# Patient Record
Sex: Male | Born: 1965
Health system: Southern US, Community
[De-identification: ages and names within clinical notes are randomized; demographics above are authoritative.]

## PROBLEM LIST (undated history)

## (undated) DIAGNOSIS — F32A Depression, unspecified: Secondary | ICD-10-CM

## (undated) DIAGNOSIS — Z8489 Family history of other specified conditions: Secondary | ICD-10-CM

## (undated) DIAGNOSIS — E11319 Type 2 diabetes mellitus with unspecified diabetic retinopathy without macular edema: Secondary | ICD-10-CM

## (undated) DIAGNOSIS — F419 Anxiety disorder, unspecified: Secondary | ICD-10-CM

## (undated) DIAGNOSIS — I499 Cardiac arrhythmia, unspecified: Secondary | ICD-10-CM

## (undated) DIAGNOSIS — J189 Pneumonia, unspecified organism: Secondary | ICD-10-CM

## (undated) DIAGNOSIS — L97509 Non-pressure chronic ulcer of other part of unspecified foot with unspecified severity: Secondary | ICD-10-CM

## (undated) DIAGNOSIS — F329 Major depressive disorder, single episode, unspecified: Secondary | ICD-10-CM

## (undated) DIAGNOSIS — I471 Supraventricular tachycardia, unspecified: Secondary | ICD-10-CM

## (undated) DIAGNOSIS — E119 Type 2 diabetes mellitus without complications: Secondary | ICD-10-CM

## (undated) DIAGNOSIS — E1140 Type 2 diabetes mellitus with diabetic neuropathy, unspecified: Secondary | ICD-10-CM

## (undated) HISTORY — PX: DENTAL RESTORATION/EXTRACTION WITH X-RAY: SHX5796

## (undated) HISTORY — PX: WISDOM TOOTH EXTRACTION: SHX21

## (undated) HISTORY — PX: MOUTH SURGERY: SHX715

## (undated) HISTORY — PX: PILONIDAL CYST DRAINAGE: SHX743

## (undated) HISTORY — PX: SUPRAVENTRICULAR TACHYCARDIA ABLATION: SHX6106

## (undated) HISTORY — PX: EYE SURGERY: SHX253

---

## 1999-09-26 ENCOUNTER — Encounter: Payer: Self-pay | Admitting: Emergency Medicine

## 1999-09-26 ENCOUNTER — Emergency Department (HOSPITAL_COMMUNITY): Admission: EM | Admit: 1999-09-26 | Discharge: 1999-09-26 | Payer: Self-pay | Admitting: Emergency Medicine

## 2001-07-05 ENCOUNTER — Emergency Department (HOSPITAL_COMMUNITY): Admission: EM | Admit: 2001-07-05 | Discharge: 2001-07-06 | Payer: Self-pay | Admitting: Emergency Medicine

## 2002-03-27 ENCOUNTER — Emergency Department (HOSPITAL_COMMUNITY): Admission: EM | Admit: 2002-03-27 | Discharge: 2002-03-28 | Payer: Self-pay | Admitting: Emergency Medicine

## 2002-03-27 ENCOUNTER — Encounter: Payer: Self-pay | Admitting: Emergency Medicine

## 2002-03-28 ENCOUNTER — Encounter: Payer: Self-pay | Admitting: Emergency Medicine

## 2004-10-30 ENCOUNTER — Emergency Department (HOSPITAL_COMMUNITY): Admission: EM | Admit: 2004-10-30 | Discharge: 2004-10-30 | Payer: Self-pay | Admitting: Family Medicine

## 2004-11-03 ENCOUNTER — Emergency Department (HOSPITAL_COMMUNITY): Admission: EM | Admit: 2004-11-03 | Discharge: 2004-11-03 | Payer: Self-pay | Admitting: Family Medicine

## 2005-11-02 ENCOUNTER — Emergency Department (HOSPITAL_COMMUNITY): Admission: EM | Admit: 2005-11-02 | Discharge: 2005-11-03 | Payer: Self-pay | Admitting: Emergency Medicine

## 2007-08-19 ENCOUNTER — Emergency Department (HOSPITAL_COMMUNITY): Admission: EM | Admit: 2007-08-19 | Discharge: 2007-08-19 | Payer: Self-pay | Admitting: Family Medicine

## 2010-03-21 DIAGNOSIS — E113599 Type 2 diabetes mellitus with proliferative diabetic retinopathy without macular edema, unspecified eye: Secondary | ICD-10-CM | POA: Insufficient documentation

## 2010-03-21 DIAGNOSIS — H4310 Vitreous hemorrhage, unspecified eye: Secondary | ICD-10-CM | POA: Insufficient documentation

## 2010-04-24 DIAGNOSIS — G4700 Insomnia, unspecified: Secondary | ICD-10-CM | POA: Insufficient documentation

## 2010-04-24 DIAGNOSIS — E785 Hyperlipidemia, unspecified: Secondary | ICD-10-CM | POA: Insufficient documentation

## 2010-10-21 ENCOUNTER — Ambulatory Visit
Admission: RE | Admit: 2010-10-21 | Discharge: 2010-10-21 | Disposition: A | Discharge status: Home / Self Care | Payer: Medicaid Other | Source: Ambulatory Visit | Attending: Surgery | Admitting: Surgery

## 2010-10-21 ENCOUNTER — Other Ambulatory Visit: Payer: Self-pay | Admitting: Surgery

## 2010-10-21 DIAGNOSIS — Z01818 Encounter for other preprocedural examination: Secondary | ICD-10-CM

## 2011-01-30 NOTE — Consult Note (Signed)
NAME:  Billy [name], DESIRE NO.:  [id number]   MEDICAL RECORD NO.:  [id number]          PATIENT TYPE:  EMS   LOCATION:  [facility]                         FACILITY:  [facility]   PHYSICIAN:  Thora Lance, M.D.  DATE OF BIRTH:  [birth year]   DATE OF CONSULTATION:  11/03/2005  DATE OF DISCHARGE:                                   CONSULTATION   CHIEF COMPLAINT:  Chest discomfort.   This is a 45 year old white male with history of diabetes mellitus and  hyperlipidemia who has been noncompliant with medical therapy and follow-up  with his primary physician, Dr. Pete Glatter, who on Saturday had smoked some  marijuana and had eaten two large cheeseburgers.  He was sitting in the car  with his friend when he felt a sudden flash of pain in a linear pattern  across his left chest.  This lasted for about a second.  Afterwards he felt  shaky, clammy and a little hazy.  He had some difficulty with  concentration.  This all lasted a few minutes and then resolved.  For the  next two hours, he felt a vague pressure in his left chest which resolved  spontaneously. It was not exertional.  There was no shortness of breath,  nausea or vomiting.  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 This lasted for about a third of a  second.  He came immediately to the ER.  In the ER he again had similar  sensation lasting two or three seconds.  He was placed on nitroglycerin for  the concern about unstable angina and I was asked to see the patient.   The patient does have a history of diabetes mellitus for about five years.  He is taking no medication for this at this time and his blood sugars are  running consistently in the 200s.  He also has history of hyperlipidemia and  has stopped taking his cholesterol medicine.  His other medical history is  significant for SVT status post  ablation 10 years ago.  He last saw his  primary physician, Dr. Merlene Laughter, about six weeks ago.  He was suppose  to get lab work and follow up and has not done so yet.   PAST MEDICAL HISTORY:  1.  Diabetes mellitus.  2.  SVT status post ablation.  3.  Increased cholesterol.   ALLERGIES:  NO KNOWN DRUG ALLERGIES.   CURRENT MEDICATION:  Pierce Crane (male enhancement).  No other medications.   SOCIAL HISTORY:  He is divorcing his wife.  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  Smoking:  No.  Alcohol:  Occasional.   REVIEW OF SYSTEMS:  Otherwise negative.   PHYSICAL EXAMINATION:  GENERAL APPEARANCE:  A moderately obese white male.  VITAL SIGNS:  Blood pressure 139/86, heart rate 87, respirations 20,  temperature 97.8.  NECK:  No bruits.  LUNGS:  Clear.  CARDIOVASCULAR:  Regular rate and rhythm without  murmurs, rubs, or gallops.  ABDOMEN:  Soft, nontender, no masses or hepatosplenomegaly.  EXTREMITIES:  No edema.   LABORATORY DATA:  Chemistry:  Sodium 138, potassium 3.6, chloride 104, BUN  13, creatinine 0.7, glucose 214.  CK-MB 1.4/1.5/1.3, troponin I less than  0.05 x3.   Chest x-ray:  No acute abnormalities.   EKG:  Normal sinus rhythm, normal EKG.   ASSESSMENT:  1.  Noncardiac chest discomfort.  2.  Diabetes mellitus, poorly controlled.  3.  Hyperlipidemia, off medications.  4.  History of supraventricular tachycardia.  5.  Medical noncompliance.   PLAN:  Discharge from emergency room.  Will have Dr. Laverle Hobby nurse call  him tomorrow to arrange follow-up for management of his multiple medical  problems.           ______________________________  Thora Lance, M.D.     Delorse Limber  D:  11/03/2005  T:  11/03/2005  Job:  [id number]   cc:   Hal T. Stoneking, M.D.  Fax: [phone]

## 2011-02-26 DIAGNOSIS — I10 Essential (primary) hypertension: Secondary | ICD-10-CM | POA: Diagnosis present

## 2011-04-18 ENCOUNTER — Emergency Department (HOSPITAL_COMMUNITY)
Admission: EM | Admit: 2011-04-18 | Discharge: 2011-04-18 | Disposition: A | Discharge status: Home / Self Care | Payer: Medicaid Other | Attending: Emergency Medicine | Admitting: Emergency Medicine

## 2011-04-18 ENCOUNTER — Emergency Department (HOSPITAL_COMMUNITY): Payer: Medicaid Other

## 2011-04-18 DIAGNOSIS — I10 Essential (primary) hypertension: Secondary | ICD-10-CM | POA: Insufficient documentation

## 2011-04-18 DIAGNOSIS — Z79899 Other long term (current) drug therapy: Secondary | ICD-10-CM | POA: Insufficient documentation

## 2011-04-18 DIAGNOSIS — R062 Wheezing: Secondary | ICD-10-CM | POA: Insufficient documentation

## 2011-04-18 DIAGNOSIS — E7800 Pure hypercholesterolemia, unspecified: Secondary | ICD-10-CM | POA: Insufficient documentation

## 2011-04-18 DIAGNOSIS — R509 Fever, unspecified: Secondary | ICD-10-CM | POA: Insufficient documentation

## 2011-04-18 DIAGNOSIS — E119 Type 2 diabetes mellitus without complications: Secondary | ICD-10-CM | POA: Insufficient documentation

## 2011-04-18 DIAGNOSIS — R0989 Other specified symptoms and signs involving the circulatory and respiratory systems: Secondary | ICD-10-CM | POA: Insufficient documentation

## 2011-04-18 DIAGNOSIS — R05 Cough: Secondary | ICD-10-CM | POA: Insufficient documentation

## 2011-04-18 DIAGNOSIS — J189 Pneumonia, unspecified organism: Secondary | ICD-10-CM | POA: Insufficient documentation

## 2011-04-18 DIAGNOSIS — R059 Cough, unspecified: Secondary | ICD-10-CM | POA: Insufficient documentation

## 2011-04-18 LAB — TROPONIN I: Troponin I: <0.3 ng/mL (ref <0.30)

## 2011-04-18 LAB — DIFFERENTIAL
Basophils Absolute: 0 10*3/uL (ref 0.0–0.1)
Basophils Relative: 0 % (ref 0–1)
Eosinophils Absolute: 0.1 10*3/uL (ref 0.0–0.7)
Eosinophils Relative: 1 % (ref 0–5)
Lymphocytes Relative: 14 % (ref 12–46)
Lymphs Abs: 1.2 10*3/uL (ref 0.7–4.0)
Monocytes Absolute: 1.1 10*3/uL — ABNORMAL HIGH (ref 0.1–1.0)
Monocytes Relative: 13 % — ABNORMAL HIGH (ref 3–12)
Neutro Abs: 6.2 10*3/uL (ref 1.7–7.7)
Neutrophils Relative %: 72 % (ref 43–77)

## 2011-04-18 LAB — CBC
HCT: 39.3 % (ref 39.0–52.0)
Hemoglobin: 13.8 g/dL (ref 13.0–17.0)
MCH: 31.7 pg (ref 26.0–34.0)
MCHC: 35.1 g/dL (ref 30.0–36.0)
MCV: 90.3 fL (ref 78.0–100.0)
Platelets: 168 10*3/uL (ref 150–400)
RBC: 4.35 MIL/uL (ref 4.22–5.81)
RDW: 12.2 % (ref 11.5–15.5)
WBC: 8.6 10*3/uL (ref 4.0–10.5)

## 2011-04-18 LAB — CK TOTAL AND CKMB (NOT AT ARMC)
CK, MB: 2.5 ng/mL (ref 0.3–4.0)
Relative Index: 1.7 (ref 0.0–2.5)
Total CK: 150 U/L (ref 7–232)

## 2011-04-18 LAB — POCT I-STAT, CHEM 8
BUN: 16 mg/dL (ref 6–23)
Calcium, Ion: 1.18 mmol/L (ref 1.12–1.32)
Chloride: 104 mEq/L (ref 96–112)
Creatinine, Ser: 1.1 mg/dL (ref 0.50–1.35)
Glucose, Bld: 138 mg/dL — ABNORMAL HIGH (ref 70–99)
HCT: 40 % (ref 39.0–52.0)
Hemoglobin: 13.6 g/dL (ref 13.0–17.0)
Potassium: 4.3 mEq/L (ref 3.5–5.1)
Sodium: 139 mEq/L (ref 135–145)
TCO2: 25 mmol/L (ref 0–100)

## 2011-07-01 DIAGNOSIS — M1000 Idiopathic gout, unspecified site: Secondary | ICD-10-CM | POA: Insufficient documentation

## 2011-09-23 DIAGNOSIS — F419 Anxiety disorder, unspecified: Secondary | ICD-10-CM | POA: Diagnosis present

## 2012-05-15 ENCOUNTER — Emergency Department (HOSPITAL_COMMUNITY): Payer: Medicaid Other

## 2012-05-15 ENCOUNTER — Encounter (HOSPITAL_COMMUNITY): Payer: Self-pay | Admitting: *Deleted

## 2012-05-15 ENCOUNTER — Emergency Department (HOSPITAL_COMMUNITY)
Admission: EM | Admit: 2012-05-15 | Discharge: 2012-05-16 | Disposition: A | Discharge status: Home / Self Care | Payer: Medicaid Other | Attending: Emergency Medicine | Admitting: Emergency Medicine

## 2012-05-15 DIAGNOSIS — N201 Calculus of ureter: Secondary | ICD-10-CM | POA: Insufficient documentation

## 2012-05-15 DIAGNOSIS — R109 Unspecified abdominal pain: Secondary | ICD-10-CM | POA: Insufficient documentation

## 2012-05-15 DIAGNOSIS — E119 Type 2 diabetes mellitus without complications: Secondary | ICD-10-CM | POA: Insufficient documentation

## 2012-05-15 DIAGNOSIS — R110 Nausea: Secondary | ICD-10-CM | POA: Insufficient documentation

## 2012-05-15 DIAGNOSIS — Z794 Long term (current) use of insulin: Secondary | ICD-10-CM | POA: Insufficient documentation

## 2012-05-15 DIAGNOSIS — Z79899 Other long term (current) drug therapy: Secondary | ICD-10-CM | POA: Insufficient documentation

## 2012-05-15 MED ORDER — TAMSULOSIN HCL 0.4 MG PO CAPS: ORAL_CAPSULE | ORAL | Status: DC

## 2012-05-15 MED ORDER — KETOROLAC TROMETHAMINE 10 MG PO TABS: ORAL_TABLET | ORAL | Status: DC

## 2012-05-15 MED ORDER — HYDROMORPHONE HCL PF 1 MG/ML IJ SOLN
1.0000 mg | Freq: Once | INTRAMUSCULAR | Status: AC
  Administered 2012-05-15: 1 mg via INTRAVENOUS
  Filled 2012-05-15: qty 1

## 2012-05-15 MED ORDER — KETOROLAC TROMETHAMINE 30 MG/ML IJ SOLN
30.0000 mg | Freq: Once | INTRAMUSCULAR | Status: AC
  Administered 2012-05-16: 30 mg via INTRAVENOUS
  Filled 2012-05-15: qty 1

## 2012-05-15 MED ORDER — DIPHENHYDRAMINE HCL 50 MG/ML IJ SOLN
50.0000 mg | Freq: Once | INTRAMUSCULAR | Status: AC
  Administered 2012-05-15: 50 mg via INTRAVENOUS
  Filled 2012-05-15: qty 1

## 2012-05-15 MED ORDER — OXYCODONE-ACETAMINOPHEN 5-325 MG PO TABS: ORAL_TABLET | ORAL | Status: DC

## 2012-05-15 MED ORDER — METOCLOPRAMIDE HCL 5 MG/ML IJ SOLN
10.0000 mg | Freq: Once | INTRAMUSCULAR | Status: AC
  Administered 2012-05-15: 10 mg via INTRAVENOUS
  Filled 2012-05-15: qty 2

## 2012-05-15 MED ORDER — SODIUM CHLORIDE 0.9 % IV SOLN
INTRAVENOUS | Status: DC
  Administered 2012-05-15: 23:00:00 via INTRAVENOUS

## 2012-05-15 MED ORDER — PROMETHAZINE HCL 25 MG RE SUPP: 25.0000 mg | Freq: Four times a day (QID) | RECTAL | Status: DC | PRN

## 2012-05-15 NOTE — ED Notes (Signed)
EMS called to home.  Found patient ambulatory in the yard. He is complaining of right flank pain that started 2 days ago. He rates his pain at 10 of 10 He states that he has no history of kidney stones. Visual impairment  due to diabetes

## 2012-05-15 NOTE — ED Provider Notes (Addendum)
History     CSN: [id number]  Arrival date & time 05/15/12  2148   First MD Initiated Contact with Patient 05/15/12 2215      Chief Complaint  Patient presents with  . Flank Pain    right   Level V caveat for urgent need for intervention for pain control  (Consider location/radiation/quality/duration/timing/severity/associated sxs/prior treatment) HPI Patient relates 2 days ago he started having pain in his right flank that was initially intermittent but has been steady the past 3 hours. He has nausea without vomiting. He has visual impairment and does not know if he is having hematuria. He denies being a heavy caffeine or milk drinker. He states he's never had this before. He thinks this is from eating at a [employer].  PCP Dr. Evlyn Kanner   Past Medical History  Diagnosis Date  . Diabetes mellitus     No past surgical history on file.  No family history on file. Strong family history of renal stones  History  Substance Use Topics  . Smoking status: sometimes  . Smokeless tobacco: Not on file  . Alcohol Use: no   Lives at home      Review of Systems  All other systems reviewed and are negative.    Allergies  Review of patient's allergies indicates no known allergies.  Home Medications   Current Outpatient Rx  Name Route Sig Dispense Refill  . ALPRAZOLAM 1 MG PO TABS Oral Take 1 mg by mouth 3 (three) times daily as needed. For anxiety    . INSULIN GLARGINE 100 UNIT/ML [place] SOLN Subcutaneous Inject 30 Units into the skin at bedtime.    Marland Kitchen LINAGLIPTIN 5 MG PO TABS Oral Take 5 mg by mouth daily.    Marland Kitchen RAMIPRIL 10 MG PO CAPS Oral Take 10 mg by mouth daily.      BP 143/74  Pulse 92  Temp 97.7 F (36.5 C) (Oral)  Resp 20  SpO2 98%  Vital signs normal    Physical Exam  Nursing note and vitals reviewed. Constitutional: He is oriented to person, place, and time. He appears well-developed and well-nourished.  Non-toxic appearance. He does not appear ill.  He appears distressed.  HENT:  Head: Normocephalic and atraumatic.  Right Ear: External ear normal.  Left Ear: External ear normal.  Nose: Nose normal. No mucosal edema or rhinorrhea.  Mouth/Throat: Oropharynx is clear and moist and mucous membranes are normal. No dental abscesses or uvula swelling.  Eyes: Conjunctivae and EOM are normal. Pupils are equal, round, and reactive to light.  Neck: Normal range of motion and full passive range of motion without pain. Neck supple.  Cardiovascular: Normal rate, regular rhythm and normal heart sounds.  Exam reveals no gallop and no friction rub.   No murmur heard. Pulmonary/Chest: Effort normal and breath sounds normal. No respiratory distress. He has no wheezes. He has no rhonchi. He has no rales. He exhibits no tenderness and no crepitus.  Abdominal: Soft. Normal appearance and bowel sounds are normal. He exhibits no distension. There is no tenderness. There is no rebound and no guarding.  Musculoskeletal: Normal range of motion. He exhibits no edema and no tenderness.       Moves all extremities well.   Neurological: He is alert and oriented to person, place, and time. He has normal strength. No cranial nerve deficit.  Skin: Skin is warm and intact. No rash noted. He is diaphoretic. No erythema. There is pallor.  Psychiatric: His speech is normal  and behavior is normal. His mood appears not anxious.       anxious    ED Course  Procedures (including critical care time)   Medications  0.9 %  sodium chloride infusion (  Intravenous New Bag/Given 05/15/12 2231)  ketorolac (TORADOL) 30 MG/ML injection 30 mg (not administered)  HYDROmorphone (DILAUDID) injection 1 mg (1 mg Intravenous Given 05/15/12 2230)  metoCLOPramide (REGLAN) injection 10 mg (10 mg Intravenous Given 05/15/12 2231)  diphenhydrAMINE (BENADRYL) injection 50 mg (50 mg Intravenous Given 05/15/12 2231)   Recheck pt states his pain is almost gone. Discuss treatment plan.   Pt discharged  by nursing staff before urine obtained.  Results for orders placed during the hospital encounter of 04/18/11  DIFFERENTIAL      Component Value Range   Neutrophils Relative 72  43 - 77 %   Neutro Abs 6.2  1.7 - 7.7 K/uL   Lymphocytes Relative 14  12 - 46 %   Lymphs Abs 1.2  0.7 - 4.0 K/uL   Monocytes Relative 13 (*) 3 - 12 %   Monocytes Absolute 1.1 (*) 0.1 - 1.0 K/uL   Eosinophils Relative 1  0 - 5 %   Eosinophils Absolute 0.1  0.0 - 0.7 K/uL   Basophils Relative 0  0 - 1 %   Basophils Absolute 0.0  0.0 - 0.1 K/uL  CBC      Component Value Range   WBC 8.6  4.0 - 10.5 K/uL   RBC 4.35  4.22 - 5.81 MIL/uL   Hemoglobin 13.8  13.0 - 17.0 g/dL   HCT 16.1  09.6 - 04.5 %   MCV 90.3  78.0 - 100.0 fL   MCH 31.7  26.0 - 34.0 pg   MCHC 35.1  30.0 - 36.0 g/dL   RDW 40.9  81.1 - 91.4 %   Platelets 168  150 - 400 K/uL  CK TOTAL AND CKMB      Component Value Range   Total CK 150  7 - 232 U/L   CK, MB 2.5  0.3 - 4.0 ng/mL   Relative Index 1.7  0.0 - 2.5  TROPONIN I      Component Value Range   Troponin I <0.30  <0.30 ng/mL  POCT I-STAT, CHEM 8      Component Value Range   Sodium 139  135 - 145 mEq/L   Potassium 4.3  3.5 - 5.1 mEq/L   Chloride 104  96 - 112 mEq/L   BUN 16  6 - 23 mg/dL   Creatinine, Ser 7.82  0.50 - 1.35 mg/dL   Glucose, Bld 956 (*) 70 - 99 mg/dL   Calcium, Ion 2.13  0.86 - 1.32 mmol/L   TCO2 25  0 - 100 mmol/L   Hemoglobin 13.6  13.0 - 17.0 g/dL   HCT 57.8  46.9 - 62.9 %   Laboratory interpretation all normal except    Ct Abdomen Pelvis Wo Contrast  05/15/2012  *RADIOLOGY REPORT*  Clinical Data: Right flank pain and nausea for 1 day  CT ABDOMEN AND PELVIS WITHOUT CONTRAST  Technique:  Multidetector CT imaging of the abdomen and pelvis was performed following the standard protocol without intravenous contrast. Sagittal and coronal MPR images reconstructed from axial data set.  Comparison: None  Findings: Minimal subsegmental atelectasis at lung base. Tiny  nonobstructing calculus lower pole right kidney image 47. Mild right hydronephrosis, perinephric edema, and right hydroureter secondary to a 3 mm diameter distal right ureteral calculus  just above the ureterovesicle junction. No additional urinary tract calcifications. Minimal nonspecific left perinephric stranding. Within limits of a nonenhanced exam, no focal abnormalities of the liver, spleen, pancreas, or adrenal glands.  Normal appendix. Stomach and bowel loops grossly unremarkable for technique. No mass, adenopathy, free fluid or inflammatory process. Calcified lymph node right external iliac. No acute osseous findings. Grade 1 anterolisthesis at L5-S1 secondary to bilateral spondylolysis L5.  IMPRESSION: Right hydronephrosis and hydroureter secondary to a 3 mm diameter distal right ureteral calculus just above the ureterovesicle junction.   Original Report Authenticated By: Lollie Marrow, M.D.     1. Right ureteral stone     New Prescriptions   KETOROLAC (TORADOL) 10 MG TABLET    Take 1 po QID until gone   OXYCODONE-ACETAMINOPHEN (PERCOCET/ROXICET) 5-325 MG PER TABLET    Take 1 or 2 po Q 6hrs for pain   PROMETHAZINE (PHENERGAN) 25 MG SUPPOSITORY    Place 1 suppository (25 mg total) rectally every 6 (six) hours as needed for nausea.   TAMSULOSIN HCL (FLOMAX) 0.4 MG CAPS    Take 1 po QD until you pass the stone.    Plan discharge  Devoria Albe, MD, FACEP   MDM          Ward Givens, MD 05/15/12 9604  Ward Givens, MD 05/16/12 Moses Manners

## 2012-06-04 ENCOUNTER — Emergency Department (HOSPITAL_BASED_OUTPATIENT_CLINIC_OR_DEPARTMENT_OTHER): Payer: Medicaid Other

## 2012-06-04 ENCOUNTER — Inpatient Hospital Stay (HOSPITAL_BASED_OUTPATIENT_CLINIC_OR_DEPARTMENT_OTHER)
Admission: EM | Admit: 2012-06-04 | Discharge: 2012-06-06 | DRG: 638 | Disposition: A | Discharge status: Home / Self Care | Payer: Medicaid Other | Attending: Endocrinology | Admitting: Endocrinology

## 2012-06-04 ENCOUNTER — Encounter (HOSPITAL_BASED_OUTPATIENT_CLINIC_OR_DEPARTMENT_OTHER): Payer: Self-pay | Admitting: *Deleted

## 2012-06-04 DIAGNOSIS — E11621 Type 2 diabetes mellitus with foot ulcer: Secondary | ICD-10-CM | POA: Diagnosis present

## 2012-06-04 DIAGNOSIS — Z79899 Other long term (current) drug therapy: Secondary | ICD-10-CM

## 2012-06-04 DIAGNOSIS — E1169 Type 2 diabetes mellitus with other specified complication: Principal | ICD-10-CM | POA: Diagnosis present

## 2012-06-04 DIAGNOSIS — E1139 Type 2 diabetes mellitus with other diabetic ophthalmic complication: Secondary | ICD-10-CM | POA: Diagnosis present

## 2012-06-04 DIAGNOSIS — Z794 Long term (current) use of insulin: Secondary | ICD-10-CM

## 2012-06-04 DIAGNOSIS — M869 Osteomyelitis, unspecified: Secondary | ICD-10-CM

## 2012-06-04 DIAGNOSIS — M9080 Osteopathy in diseases classified elsewhere, unspecified site: Secondary | ICD-10-CM | POA: Diagnosis present

## 2012-06-04 DIAGNOSIS — L97519 Non-pressure chronic ulcer of other part of right foot with unspecified severity: Secondary | ICD-10-CM | POA: Diagnosis present

## 2012-06-04 DIAGNOSIS — E1129 Type 2 diabetes mellitus with other diabetic kidney complication: Secondary | ICD-10-CM | POA: Diagnosis present

## 2012-06-04 DIAGNOSIS — E1142 Type 2 diabetes mellitus with diabetic polyneuropathy: Secondary | ICD-10-CM | POA: Diagnosis present

## 2012-06-04 DIAGNOSIS — N058 Unspecified nephritic syndrome with other morphologic changes: Secondary | ICD-10-CM | POA: Diagnosis present

## 2012-06-04 DIAGNOSIS — L97509 Non-pressure chronic ulcer of other part of unspecified foot with unspecified severity: Secondary | ICD-10-CM | POA: Diagnosis present

## 2012-06-04 DIAGNOSIS — I498 Other specified cardiac arrhythmias: Secondary | ICD-10-CM | POA: Diagnosis present

## 2012-06-04 DIAGNOSIS — E11319 Type 2 diabetes mellitus with unspecified diabetic retinopathy without macular edema: Secondary | ICD-10-CM | POA: Diagnosis present

## 2012-06-04 DIAGNOSIS — Q667 Congenital pes cavus, unspecified foot: Secondary | ICD-10-CM

## 2012-06-04 DIAGNOSIS — I10 Essential (primary) hypertension: Secondary | ICD-10-CM | POA: Diagnosis present

## 2012-06-04 DIAGNOSIS — E1149 Type 2 diabetes mellitus with other diabetic neurological complication: Secondary | ICD-10-CM | POA: Diagnosis present

## 2012-06-04 HISTORY — DX: Supraventricular tachycardia, unspecified: I47.10

## 2012-06-04 HISTORY — DX: Supraventricular tachycardia: I47.1

## 2012-06-04 LAB — CBC WITH DIFFERENTIAL/PLATELET
Basophils Absolute: 0 10*3/uL (ref 0.0–0.1)
Basophils Relative: 0 % (ref 0–1)
Eosinophils Absolute: 0.1 10*3/uL (ref 0.0–0.7)
Eosinophils Relative: 2 % (ref 0–5)
HCT: 39.4 % (ref 39.0–52.0)
Hemoglobin: 13.8 g/dL (ref 13.0–17.0)
Lymphocytes Relative: 39 % (ref 12–46)
Lymphs Abs: 2.7 10*3/uL (ref 0.7–4.0)
MCH: 31.7 pg (ref 26.0–34.0)
MCHC: 35 g/dL (ref 30.0–36.0)
MCV: 90.4 fL (ref 78.0–100.0)
Monocytes Absolute: 0.6 10*3/uL (ref 0.1–1.0)
Monocytes Relative: 9 % (ref 3–12)
Neutro Abs: 3.5 10*3/uL (ref 1.7–7.7)
Neutrophils Relative %: 50 % (ref 43–77)
Platelets: 207 10*3/uL (ref 150–400)
RBC: 4.36 MIL/uL (ref 4.22–5.81)
RDW: 12.2 % (ref 11.5–15.5)
WBC: 7 10*3/uL (ref 4.0–10.5)

## 2012-06-04 LAB — BASIC METABOLIC PANEL
BUN: 17 mg/dL (ref 6–23)
CO2: 28 mEq/L (ref 19–32)
Calcium: 9.6 mg/dL (ref 8.4–10.5)
Chloride: 102 mEq/L (ref 96–112)
Creatinine, Ser: 0.9 mg/dL (ref 0.50–1.35)
GFR calc Af Amer: >90 mL/min (ref >90)
GFR calc non Af Amer: >90 mL/min (ref >90)
Glucose, Bld: 100 mg/dL — ABNORMAL HIGH (ref 70–99)
Potassium: 4 mEq/L (ref 3.5–5.1)
Sodium: 140 mEq/L (ref 135–145)

## 2012-06-04 LAB — GLUCOSE, CAPILLARY: Glucose-Capillary: 151 mg/dL — ABNORMAL HIGH (ref 70–99)

## 2012-06-04 MED ORDER — VANCOMYCIN HCL IN DEXTROSE 1-5 GM/200ML-% IV SOLN
1000.0000 mg | Freq: Once | INTRAVENOUS | Status: AC
  Administered 2012-06-04: 1000 mg via INTRAVENOUS
  Filled 2012-06-04: qty 200

## 2012-06-04 MED ORDER — SODIUM CHLORIDE 0.9 % IV SOLN
Freq: Once | INTRAVENOUS | Status: AC
  Administered 2012-06-04: 22:00:00 via INTRAVENOUS

## 2012-06-04 MED ORDER — PIPERACILLIN-TAZOBACTAM 3.375 G IVPB 30 MIN
3.3750 g | Freq: Once | INTRAVENOUS | Status: AC
  Administered 2012-06-04: 3.375 g via INTRAVENOUS
  Filled 2012-06-04 (×2): qty 50

## 2012-06-04 NOTE — ED Notes (Signed)
Pt presents to ED today with wound to right middle toe that he noticed several days ago.  Pt is known diabetic and is very compliant with regime.  Pt attempted to treat at home with OTC remedies with no success.

## 2012-06-04 NOTE — ED Notes (Signed)
Pt and family updated on POC and admission

## 2012-06-04 NOTE — ED Provider Notes (Addendum)
History     CSN: [id number]  Arrival date & time 06/04/12  1757   First MD Initiated Contact with Patient 06/04/12 1908      Chief Complaint  Patient presents with  . Wound Infection    (Consider location/radiation/quality/duration/timing/severity/associated sxs/prior treatment) The history is provided by the patient.  Billy [name] is a 46 y.o. male hx of DM here with R foot infection. Noticed an foot ulcer on R 2nd toe a week ago. The ulcer has been progressively worse and now the R 2nd toe is swollen and red. No fever or chills or purulent drainage. Called his doctor and sent in for eval.    Past Medical History  Diagnosis Date  . Diabetes mellitus   . H/O prior ablation treatment   . SVT (supraventricular tachycardia)     Past Surgical History  Procedure Date  . Eye surgery     History reviewed. No pertinent family history.  History  Substance Use Topics  . Smoking status: Never Smoker   . Smokeless tobacco: Not on file  . Alcohol Use: No      Review of Systems  Skin: Positive for wound.  All other systems reviewed and are negative.    Allergies  Review of patient's allergies indicates no known allergies.  Home Medications   Current Outpatient Rx  Name Route Sig Dispense Refill  . TERBINAFINE HCL 250 MG PO TABS Oral Take 250 mg by mouth daily.    Marland Kitchen ALPRAZOLAM 1 MG PO TABS Oral Take 1 mg by mouth 3 (three) times daily as needed. For anxiety    . INSULIN GLARGINE 100 UNIT/ML [place] SOLN Subcutaneous Inject 30 Units into the skin at bedtime.    Marland Kitchen KETOROLAC TROMETHAMINE 10 MG PO TABS  Take 1 po QID until gone 10 tablet 0  . LINAGLIPTIN 5 MG PO TABS Oral Take 5 mg by mouth daily.    . OXYCODONE-ACETAMINOPHEN 5-325 MG PO TABS  Take 1 or 2 po Q 6hrs for pain 20 tablet 0  . PROMETHAZINE HCL 25 MG RE SUPP Rectal Place 1 suppository (25 mg total) rectally every 6 (six) hours as needed for nausea. 12 each 0  . RAMIPRIL 10 MG PO CAPS Oral Take 10 mg by mouth daily.     Marland Kitchen TAMSULOSIN HCL 0.4 MG PO CAPS  Take 1 po QD until you pass the stone. 10 capsule 0    BP 141/85  Pulse 79  Temp 98 F (36.7 C) (Oral)  Resp 20  Ht 6\' 2"  (1.88 m)  Wt 245 lb (111.131 kg)  BMI 31.46 kg/m2  SpO2 99%  Physical Exam  Nursing note and vitals reviewed. Constitutional: He is oriented to person, place, and time. He appears well-developed and well-nourished.       Calm, NAD  HENT:  Head: Normocephalic.  Mouth/Throat: Oropharynx is clear and moist.  Eyes: Conjunctivae normal are normal. Pupils are equal, round, and reactive to light.  Neck: Normal range of motion. Neck supple.  Cardiovascular: Normal rate, regular rhythm and normal heart sounds.   Pulmonary/Chest: Effort normal and breath sounds normal.  Abdominal: Soft. Bowel sounds are normal.  Musculoskeletal: Normal range of motion. He exhibits no edema.  Neurological: He is alert and oriented to person, place, and time.  Skin: Skin is warm and dry.       R 2nd toe ulcer on the tip of the digit.  Some purulent drainage. Ulcer possibly deep to bone. Mild redness around the  toe. Nl motor and sensation. 2+ pulses of the foot.   Psychiatric: He has a normal mood and affect. His behavior is normal. Judgment and thought content normal.    ED Course  Procedures (including critical care time)  Labs Reviewed  GLUCOSE, CAPILLARY - Abnormal; Notable for the following:    Glucose-Capillary 151 (*)     All other components within normal limits  BASIC METABOLIC PANEL - Abnormal; Notable for the following:    Glucose, Bld 100 (*)     All other components within normal limits  CBC WITH DIFFERENTIAL   Dg Toe 2nd Right  06/04/2012  *RADIOLOGY REPORT*  Clinical Data: Diabetic with ulcer.  RIGTH SECOND TOE  Comparison: None.  Findings: Soft tissue swelling right second toe with minimal irregularity of the right second distal phalanx tuft.  Early osteomyelitis not entirely excluded.  Remote fracture right fourth metatarsal.   IMPRESSION:  Soft tissue swelling right second toe with minimal irregularity of the right second distal phalanx tuft.  Early osteomyelitis not entirely excluded.   Original Report Authenticated By: Fuller Canada, M.D.      1. Osteomyelitis of foot       MDM  Billy [name] is a 45 y.o. male hx of DM here with R foot ulcer. Will r/o osteo by xray. No systemic infection evident. Will reassess.   10:50 PM Xray showed possible early osteo. CBC showed WBC nl, BMp nl. I started vanc/zosyn. I discussed with Dr. Jarold Motto from [facility], who accepted the patient unto his service at [facility] on med/surg.       Richardean Canal, MD 06/04/12 2214  Richardean Canal, MD 06/04/12 2215  Richardean Canal, MD 06/04/12 2250

## 2012-06-04 NOTE — ED Notes (Signed)
Assigned to bed 5N08 @ [facility]

## 2012-06-04 NOTE — ED Notes (Signed)
Pt admitted with IV infusing with no difficulty.

## 2012-06-04 NOTE — ED Notes (Signed)
Pt states he noticed an ulcer on his right second toe 8 days ago. Increased redness and swelling over past 2 days. Pt is diabetic. FSBS 151 at triage

## 2012-06-05 DIAGNOSIS — M869 Osteomyelitis, unspecified: Secondary | ICD-10-CM | POA: Diagnosis present

## 2012-06-05 DIAGNOSIS — E11621 Type 2 diabetes mellitus with foot ulcer: Secondary | ICD-10-CM | POA: Diagnosis present

## 2012-06-05 DIAGNOSIS — I10 Essential (primary) hypertension: Secondary | ICD-10-CM | POA: Diagnosis present

## 2012-06-05 DIAGNOSIS — L97509 Non-pressure chronic ulcer of other part of unspecified foot with unspecified severity: Secondary | ICD-10-CM | POA: Diagnosis present

## 2012-06-05 DIAGNOSIS — L97519 Non-pressure chronic ulcer of other part of right foot with unspecified severity: Secondary | ICD-10-CM | POA: Diagnosis present

## 2012-06-05 LAB — COMPREHENSIVE METABOLIC PANEL
ALT: 9 U/L (ref 0–53)
AST: 13 U/L (ref 0–37)
Albumin: 3.7 g/dL (ref 3.5–5.2)
Alkaline Phosphatase: 38 U/L — ABNORMAL LOW (ref 39–117)
BUN: 17 mg/dL (ref 6–23)
CO2: 28 mEq/L (ref 19–32)
Calcium: 9 mg/dL (ref 8.4–10.5)
Chloride: 105 mEq/L (ref 96–112)
Creatinine, Ser: 0.87 mg/dL (ref 0.50–1.35)
GFR calc Af Amer: >90 mL/min (ref >90)
GFR calc non Af Amer: >90 mL/min (ref >90)
Glucose, Bld: 105 mg/dL — ABNORMAL HIGH (ref 70–99)
Potassium: 3.7 mEq/L (ref 3.5–5.1)
Sodium: 141 mEq/L (ref 135–145)
Total Bilirubin: 0.5 mg/dL (ref 0.3–1.2)
Total Protein: 6.7 g/dL (ref 6.0–8.3)

## 2012-06-05 LAB — GLUCOSE, CAPILLARY
Glucose-Capillary: 120 mg/dL — ABNORMAL HIGH (ref 70–99)
Glucose-Capillary: 121 mg/dL — ABNORMAL HIGH (ref 70–99)
Glucose-Capillary: 128 mg/dL — ABNORMAL HIGH (ref 70–99)
Glucose-Capillary: 150 mg/dL — ABNORMAL HIGH (ref 70–99)
Glucose-Capillary: 98 mg/dL (ref 70–99)

## 2012-06-05 LAB — CBC
HCT: 37.6 % — ABNORMAL LOW (ref 39.0–52.0)
Hemoglobin: 13.2 g/dL (ref 13.0–17.0)
MCH: 32 pg (ref 26.0–34.0)
MCHC: 35.1 g/dL (ref 30.0–36.0)
MCV: 91.3 fL (ref 78.0–100.0)
Platelets: 182 10*3/uL (ref 150–400)
RBC: 4.12 MIL/uL — ABNORMAL LOW (ref 4.22–5.81)
RDW: 12.5 % (ref 11.5–15.5)
WBC: 6.6 10*3/uL (ref 4.0–10.5)

## 2012-06-05 MED ORDER — VANCOMYCIN HCL IN DEXTROSE 1-5 GM/200ML-% IV SOLN
1000.0000 mg | Freq: Two times a day (BID) | INTRAVENOUS | Status: DC
  Administered 2012-06-06: 1000 mg via INTRAVENOUS
  Filled 2012-06-05 (×2): qty 200

## 2012-06-05 MED ORDER — ACETAMINOPHEN 325 MG PO TABS: 650.0000 mg | ORAL_TABLET | Freq: Four times a day (QID) | ORAL | Status: DC | PRN

## 2012-06-05 MED ORDER — INSULIN GLARGINE 100 UNIT/ML ~~LOC~~ SOLN: 30.0000 [IU] | Freq: Every day | SUBCUTANEOUS | Status: DC

## 2012-06-05 MED ORDER — INSULIN ASPART 100 UNIT/ML ~~LOC~~ SOLN
0.0000 [IU] | Freq: Three times a day (TID) | SUBCUTANEOUS | Status: DC
  Administered 2012-06-05 (×2): 2 [IU] via SUBCUTANEOUS
  Administered 2012-06-06: 3 [IU] via SUBCUTANEOUS

## 2012-06-05 MED ORDER — TAMSULOSIN HCL 0.4 MG PO CAPS
0.4000 mg | ORAL_CAPSULE | Freq: Every day | ORAL | Status: DC
  Filled 2012-06-05 (×2): qty 1

## 2012-06-05 MED ORDER — RAMIPRIL 10 MG PO CAPS
10.0000 mg | ORAL_CAPSULE | Freq: Once | ORAL | Status: DC
  Filled 2012-06-05: qty 1

## 2012-06-05 MED ORDER — LINAGLIPTIN 5 MG PO TABS
5.0000 mg | ORAL_TABLET | Freq: Once | ORAL | Status: DC
  Filled 2012-06-05: qty 1

## 2012-06-05 MED ORDER — VANCOMYCIN HCL IN DEXTROSE 1-5 GM/200ML-% IV SOLN
1000.0000 mg | Freq: Three times a day (TID) | INTRAVENOUS | Status: DC
  Administered 2012-06-05 (×2): 1000 mg via INTRAVENOUS
  Filled 2012-06-05 (×3): qty 200

## 2012-06-05 MED ORDER — INSULIN GLARGINE 100 UNIT/ML ~~LOC~~ SOLN
30.0000 [IU] | Freq: Every day | SUBCUTANEOUS | Status: DC
  Administered 2012-06-05 (×2): 30 [IU] via SUBCUTANEOUS

## 2012-06-05 MED ORDER — SODIUM CHLORIDE 0.9 % IV SOLN: INTRAVENOUS | Status: DC

## 2012-06-05 MED ORDER — PIPERACILLIN-TAZOBACTAM 3.375 G IVPB
3.3750 g | Freq: Three times a day (TID) | INTRAVENOUS | Status: DC
  Administered 2012-06-05 – 2012-06-06 (×4): 3.375 g via INTRAVENOUS
  Filled 2012-06-05 (×6): qty 50

## 2012-06-05 MED ORDER — ALPRAZOLAM 0.5 MG PO TABS
3.0000 mg | ORAL_TABLET | Freq: Every day | ORAL | Status: DC
  Administered 2012-06-05: 3 mg via ORAL
  Filled 2012-06-05: qty 6

## 2012-06-05 MED ORDER — TERBINAFINE HCL 250 MG PO TABS
250.0000 mg | ORAL_TABLET | Freq: Every day | ORAL | Status: DC
  Administered 2012-06-05: 250 mg via ORAL
  Filled 2012-06-05 (×2): qty 1

## 2012-06-05 MED ORDER — ALUM & MAG HYDROXIDE-SIMETH 200-200-20 MG/5ML PO SUSP: 30.0000 mL | Freq: Four times a day (QID) | ORAL | Status: DC | PRN

## 2012-06-05 MED ORDER — PROMETHAZINE HCL 12.5 MG PO TABS: 12.5000 mg | ORAL_TABLET | Freq: Four times a day (QID) | ORAL | Status: DC | PRN

## 2012-06-05 MED ORDER — ENOXAPARIN SODIUM 60 MG/0.6ML ~~LOC~~ SOLN
60.0000 mg | Freq: Every day | SUBCUTANEOUS | Status: DC
  Administered 2012-06-06: 60 mg via SUBCUTANEOUS
  Filled 2012-06-05: qty 0.6

## 2012-06-05 MED ORDER — RAMIPRIL 10 MG PO CAPS
10.0000 mg | ORAL_CAPSULE | Freq: Every day | ORAL | Status: DC
  Administered 2012-06-05 – 2012-06-06 (×2): 10 mg via ORAL
  Filled 2012-06-05 (×2): qty 1

## 2012-06-05 MED ORDER — ACETAMINOPHEN 650 MG RE SUPP: 650.0000 mg | Freq: Four times a day (QID) | RECTAL | Status: DC | PRN

## 2012-06-05 MED ORDER — PIPERACILLIN-TAZOBACTAM 3.375 G IVPB 30 MIN: 3.3750 g | Freq: Four times a day (QID) | INTRAVENOUS | Status: DC

## 2012-06-05 MED ORDER — LINAGLIPTIN 5 MG PO TABS
5.0000 mg | ORAL_TABLET | Freq: Every day | ORAL | Status: DC
  Administered 2012-06-05 – 2012-06-06 (×2): 5 mg via ORAL
  Filled 2012-06-05 (×2): qty 1

## 2012-06-05 MED ORDER — POTASSIUM CHLORIDE IN NACL 20-0.9 MEQ/L-% IV SOLN
INTRAVENOUS | Status: DC
  Administered 2012-06-05: 02:00:00 via INTRAVENOUS
  Administered 2012-06-05: 20 mL/h via INTRAVENOUS
  Filled 2012-06-05 (×2): qty 1000

## 2012-06-05 MED ORDER — BISACODYL 5 MG PO TBEC: 5.0000 mg | DELAYED_RELEASE_TABLET | Freq: Every day | ORAL | Status: DC | PRN

## 2012-06-05 MED ORDER — ENOXAPARIN SODIUM 40 MG/0.4ML ~~LOC~~ SOLN
40.0000 mg | Freq: Every day | SUBCUTANEOUS | Status: DC
  Administered 2012-06-05: 40 mg via SUBCUTANEOUS
  Filled 2012-06-05: qty 0.4

## 2012-06-05 MED ORDER — OXYCODONE-ACETAMINOPHEN 5-325 MG PO TABS: 1.0000 | ORAL_TABLET | ORAL | Status: DC | PRN

## 2012-06-05 MED ORDER — ALPRAZOLAM 0.5 MG PO TABS
1.0000 mg | ORAL_TABLET | Freq: Three times a day (TID) | ORAL | Status: DC | PRN
  Administered 2012-06-05: 1 mg via ORAL
  Filled 2012-06-05: qty 2

## 2012-06-05 NOTE — H&P (Signed)
Billy [name] is an 46 y.o. male.   Chief Complaint: swollen right 2nd toe HPI:  The patient is a 46 year old Caucasian man with several medical problems including diabetes mellitus, type II complicated by neuropathy, retinopathy, and nephropathy who about a week ago developed a tiny ulcer on the distal right second toe. The ulcer did not have exudate or tenderness and was treated with topical antibiotics. Yesterday he was visited by his sister who examined the toe and noted that most of the toe was erythematous and edematous, so they presented to the emergency room for evaluation. Emergency room evaluation was notable for the lack of fever or chills or leukocytosis with plain x-rays of the toes showing questionable minimal irregularity of the distal tuft. He was started on broad-spectrum antibiotics with vancomycin and Zosyn and admitted for further evaluation. He has not had any pain in the toe. 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 His medical history is otherwise significant for hypertension and supraventricular tachycardia that was treated about 20 years ago with an ablation procedure. Past Medical History  Diagnosis Date  . Diabetes mellitus   . H/O prior ablation treatment   . SVT (supraventricular tachycardia)     Medications Prior to Admission  Medication Sig Dispense Refill  . ALPRAZolam (XANAX) 1 MG tablet Take 3 mg by mouth at bedtime as needed. For anxiety      . insulin glargine (LANTUS) 100 UNIT/ML injection Inject 30 Units into the skin at bedtime.      Marland Kitchen linagliptin (TRADJENTA) 5 MG TABS tablet Take 5 mg by mouth daily.      . ramipril (ALTACE) 10 MG capsule Take 10 mg by mouth daily.      Marland Kitchen terbinafine (LAMISIL) 250 MG tablet Take 250 mg by mouth daily.        ADDITIONAL HOME MEDICATIONS: see MAR  PHYSICIANS INVOLVED IN CARE: Adrian Prince (PCP), Marjory Sneddon (nephrologist)  Past Surgical  History  Procedure Date  . Eye surgery     History reviewed. No pertinent family history. His father died at age 63 years from cancer of the lung and liver, and his mother died at age 62 years from congestive heart failure and complications of diabetes mellitus, type II. Is no family history of colon cancer.  Social History:  reports that he has never smoked. He does not have any smokeless tobacco history on file. He reports that he uses illicit drugs (Marijuana). He reports that he does not drink alcohol. he has been separated for about 5 years. He does not have any children. He has no history of tobacco or alcohol abuse. He is disabled because of his complications of diabetes.  Allergies: No Known Allergies   ROS: diabetes, high blood pressure and kidney disease  PHYSICAL EXAM: Blood pressure 144/80, pulse 94, temperature 98.5 F (36.9 C), temperature source Oral, resp. rate 18, height 6\' 2"  (1.88 m), weight 111.131 kg (245 lb), SpO2 98.00%. In general, the patient is a mildly overweight white man who was in no apparent distress while lying upright in bed. HEENT exam was grossly normal, neck was supple without jugular venous distention or carotid bruit, chest was clear to auscultation, heart had a regular rate and rhythm without significant murmur or gallop, abdomen had normal bowel sounds no tenderness, extremities were without cyanosis, clubbing, or edema. The bilateral pedal pulses were normal. The right second toe has a  claw toe deformity and is mildly enlarged with erythema of the distal three quarters of the toe and there is a shallow ulcer with surrounding callus on the plantar distal aspect of the toe. The ulcer extends into the dermis and there is no visible bone present. There is also no purulence, fluctuance, or tenderness.  Results for orders placed during the hospital encounter of 06/04/12 (from the past 48 hour(s))  GLUCOSE, CAPILLARY     Status: Abnormal   Collection Time    06/04/12  6:07 PM      Component Value Range Comment   Glucose-Capillary 151 (*) 70 - 99 mg/dL   CBC WITH DIFFERENTIAL     Status: Normal   Collection Time   06/04/12  9:40 PM      Component Value Range Comment   WBC 7.0  4.0 - 10.5 K/uL    RBC 4.36  4.22 - 5.81 MIL/uL    Hemoglobin 13.8  13.0 - 17.0 g/dL    HCT 16.1  09.6 - 04.5 %    MCV 90.4  78.0 - 100.0 fL    MCH 31.7  26.0 - 34.0 pg    MCHC 35.0  30.0 - 36.0 g/dL    RDW 40.9  81.1 - 91.4 %    Platelets 207  150 - 400 K/uL    Neutrophils Relative 50  43 - 77 %    Neutro Abs 3.5  1.7 - 7.7 K/uL    Lymphocytes Relative 39  12 - 46 %    Lymphs Abs 2.7  0.7 - 4.0 K/uL    Monocytes Relative 9  3 - 12 %    Monocytes Absolute 0.6  0.1 - 1.0 K/uL    Eosinophils Relative 2  0 - 5 %    Eosinophils Absolute 0.1  0.0 - 0.7 K/uL    Basophils Relative 0  0 - 1 %    Basophils Absolute 0.0  0.0 - 0.1 K/uL   BASIC METABOLIC PANEL     Status: Abnormal   Collection Time   06/04/12  9:40 PM      Component Value Range Comment   Sodium 140  135 - 145 mEq/L    Potassium 4.0  3.5 - 5.1 mEq/L    Chloride 102  96 - 112 mEq/L    CO2 28  19 - 32 mEq/L    Glucose, Bld 100 (*) 70 - 99 mg/dL    BUN 17  6 - 23 mg/dL    Creatinine, Ser 7.82  0.50 - 1.35 mg/dL    Calcium 9.6  8.4 - 95.6 mg/dL    GFR calc non Af Amer >90  >90 mL/min    GFR calc Af Amer >90  >90 mL/min   GLUCOSE, CAPILLARY     Status: Normal   Collection Time   06/05/12 12:25 AM      Component Value Range Comment   Glucose-Capillary 98  70 - 99 mg/dL   COMPREHENSIVE METABOLIC PANEL     Status: Abnormal   Collection Time   06/05/12  6:00 AM      Component Value Range Comment   Sodium 141  135 - 145 mEq/L    Potassium 3.7  3.5 - 5.1 mEq/L    Chloride 105  96 - 112 mEq/L    CO2 28  19 - 32 mEq/L    Glucose, Bld 105 (*) 70 - 99 mg/dL    BUN 17  6 - 23 mg/dL  Creatinine, Ser 0.87  0.50 - 1.35 mg/dL    Calcium 9.0  8.4 - 46.9 mg/dL    Total Protein 6.7  6.0 - 8.3 g/dL    Albumin 3.7   3.5 - 5.2 g/dL    AST 13  0 - 37 U/L    ALT 9  0 - 53 U/L    Alkaline Phosphatase 38 (*) 39 - 117 U/L    Total Bilirubin 0.5  0.3 - 1.2 mg/dL    GFR calc non Af Amer >90  >90 mL/min    GFR calc Af Amer >90  >90 mL/min   CBC     Status: Abnormal   Collection Time   06/05/12  6:00 AM      Component Value Range Comment   WBC 6.6  4.0 - 10.5 K/uL    RBC 4.12 (*) 4.22 - 5.81 MIL/uL    Hemoglobin 13.2  13.0 - 17.0 g/dL    HCT 62.9 (*) 52.8 - 52.0 %    MCV 91.3  78.0 - 100.0 fL    MCH 32.0  26.0 - 34.0 pg    MCHC 35.1  30.0 - 36.0 g/dL    RDW 41.3  24.4 - 01.0 %    Platelets 182  150 - 400 K/uL   GLUCOSE, CAPILLARY     Status: Abnormal   Collection Time   06/05/12  6:47 AM      Component Value Range Comment   Glucose-Capillary 120 (*) 70 - 99 mg/dL    Dg Toe 2nd Right  2/72/5366  *RADIOLOGY REPORT*  Clinical Data: Diabetic with ulcer.  RIGTH SECOND TOE  Comparison: None.  Findings: Soft tissue swelling right second toe with minimal irregularity of the right second distal phalanx tuft.  Early osteomyelitis not entirely excluded.  Remote fracture right fourth metatarsal.  IMPRESSION:  Soft tissue swelling right second toe with minimal irregularity of the right second distal phalanx tuft.  Early osteomyelitis not entirely excluded.   Original Report Authenticated By: Fuller Canada, M.D.      Assessment/Plan #1 Right Second Toe Diabetic Ulcer:  The ulcer is relatively shallow and does not extend to bone or tendon without full debridement. The x-ray findings are subtle. He may have osteomyelitis of the distal phalanx, and we will do an MRI exam shortness out. For now we will continue broad-spectrum antibiotics. He has a claw toe deformity which increases his risk for recurrent ulceration. Depending on the findings of the MRI scan, ideal treatment could include amputation of the distal tuft, amputation of the second ray, or hyperbaric oxygen treatment. If he does not have osteomyelitis he may be  a candidate for a procedure to correct his claw toe deformity. We will request a orthopedic specialist evaluation of this lesion. #2 Diabetes Mellitus, Type II: Well controlled on current medications. #3 Diabetic Peripheral Neuropathy: We will consider obtaining custom inserts and custom shoes for him given his insensate status. #4 Hypertension: Reasonably well controlled on current medications.  Bryker Fletchall G 06/05/2012, 10:00 AM

## 2012-06-05 NOTE — Progress Notes (Addendum)
ANTIBIOTIC CONSULT NOTE - INITIAL  Addendum: Adjusted vancomycin dose to 1000 mg IV Q12H based on renal function and weight.  Bola A. Wandra Feinstein D Clinical Pharmacist Pager:[phone] Phone [phone] 06/05/2012 1:40 PM _____________________________ Pharmacy Consult for vancomycin Indication: Diabetic foot infection / possible osteomyelitis  No Known Allergies  Patient Measurements: Height: 6\' 2"  (188 cm) Weight: 245 lb (111.131 kg) IBW/kg (Calculated) : 82.2   Vital Signs: Temp: 97.7 F (36.5 C) (09/22 0026) Temp src: Oral (09/22 0026) BP: 152/96 mmHg (09/22 0026) Pulse Rate: 108  (09/22 0026) Intake/Output from previous day: 09/21 0701 - 09/22 0700 In: 120 [P.O.:120] Out: -  Intake/Output from this shift: Total I/O In: 120 [P.O.:120] Out: -   Labs:  Basename 06/04/12 2140  WBC 7.0  HGB 13.8  PLT 207  LABCREA --  CREATININE 0.90   Estimated Creatinine Clearance: 134.6 ml/min (by C-G formula based on Cr of 0.9). No results found for this basename: VANCOTROUGH:2,VANCOPEAK:2,VANCORANDOM:2,GENTTROUGH:2,GENTPEAK:2,GENTRANDOM:2,TOBRATROUGH:2,TOBRAPEAK:2,TOBRARND:2,AMIKACINPEAK:2,AMIKACINTROU:2,AMIKACIN:2, in the last 72 hours   Microbiology: No results found for this or any previous visit (from the past 720 hour(s)).  Medical History: Past Medical History  Diagnosis Date  . Diabetes mellitus   . H/O prior ablation treatment   . SVT (supraventricular tachycardia)     Medications:  Scheduled:    . sodium chloride   Intravenous Once  . enoxaparin (LOVENOX) injection  40 mg Subcutaneous Daily  . insulin aspart  0-15 Units Subcutaneous TID WC  . insulin glargine  30 Units Subcutaneous QHS  . linagliptin  5 mg Oral Daily  . piperacillin-tazobactam  3.375 g Intravenous Once  . piperacillin-tazobactam (ZOSYN)  IV  3.375 g Intravenous Q8H  . ramipril  10 mg Oral Daily  . Tamsulosin HCl  0.4 mg Oral QPC supper  . terbinafine  250 mg Oral Daily  . vancomycin   1,000 mg Intravenous Once  . DISCONTD: sodium chloride   Intravenous STAT  . DISCONTD: insulin glargine  30 Units Subcutaneous QHS  . DISCONTD: piperacillin-tazobactam  3.375 g Intravenous Q6H   Assessment: 46 yo male admitted with diabetic foot infection and probable osteomyelitis. Pharmacy consulted to manage vancomycin. Patient has already received vancomycin 1gm IV x 1.   Goal of Therapy:  Vancomycin trough level 15-20 mcg/ml  Plan:  1. Vancomycin 1gm IV Q8H.  2. Consider vancomycin trough earlier to confirm dosing due to weight > 100kg.   Emeline Gins 06/05/2012,1:17 AM

## 2012-06-06 ENCOUNTER — Inpatient Hospital Stay (HOSPITAL_COMMUNITY): Payer: Medicaid Other

## 2012-06-06 LAB — CBC
HCT: 38.3 % — ABNORMAL LOW (ref 39.0–52.0)
Hemoglobin: 13.3 g/dL (ref 13.0–17.0)
MCH: 31.4 pg (ref 26.0–34.0)
MCHC: 34.7 g/dL (ref 30.0–36.0)
MCV: 90.5 fL (ref 78.0–100.0)
Platelets: 206 10*3/uL (ref 150–400)
RBC: 4.23 MIL/uL (ref 4.22–5.81)
RDW: 12.3 % (ref 11.5–15.5)
WBC: 6.2 10*3/uL (ref 4.0–10.5)

## 2012-06-06 LAB — GLUCOSE, CAPILLARY
Glucose-Capillary: 200 mg/dL — ABNORMAL HIGH (ref 70–99)
Glucose-Capillary: 112 mg/dL — ABNORMAL HIGH (ref 70–99)

## 2012-06-06 LAB — BASIC METABOLIC PANEL
BUN: 23 mg/dL (ref 6–23)
CO2: 25 mEq/L (ref 19–32)
Calcium: 9.2 mg/dL (ref 8.4–10.5)
Chloride: 104 mEq/L (ref 96–112)
Creatinine, Ser: 1.1 mg/dL (ref 0.50–1.35)
GFR calc Af Amer: >90 mL/min (ref >90)
GFR calc non Af Amer: 78 mL/min — ABNORMAL LOW (ref >90)
Glucose, Bld: 201 mg/dL — ABNORMAL HIGH (ref 70–99)
Potassium: 4.2 mEq/L (ref 3.5–5.1)
Sodium: 139 mEq/L (ref 135–145)

## 2012-06-06 LAB — SEDIMENTATION RATE: Sed Rate: 2 mm/hr (ref 0–16)

## 2012-06-06 MED ORDER — DOXYCYCLINE HYCLATE 100 MG PO TABS
100.0000 mg | ORAL_TABLET | Freq: Two times a day (BID) | ORAL | Status: DC
  Administered 2012-06-06: 100 mg via ORAL
  Filled 2012-06-06 (×2): qty 1

## 2012-06-06 MED ORDER — DOXYCYCLINE HYCLATE 100 MG PO TABS: 100.0000 mg | ORAL_TABLET | Freq: Two times a day (BID) | ORAL | Status: DC

## 2012-06-06 MED ORDER — GADOBENATE DIMEGLUMINE 529 MG/ML IV SOLN
20.0000 mL | Freq: Once | INTRAVENOUS | Status: AC
  Administered 2012-06-06: 20 mL via INTRAVENOUS

## 2012-06-06 NOTE — Progress Notes (Signed)
UR COMPLETED  

## 2012-06-06 NOTE — Progress Notes (Signed)
Subjective: MRI not done until this AM. No results. No chills or sweats. WBC OK. Toe may be a bit less red per his report. Eating OK   Objective: Vital signs in last 24 hours: Temp:  [97.5 F (36.4 C)-98 F (36.7 C)] 97.5 F (36.4 C) (09/23 0533) Pulse Rate:  [83-93] 93  (09/23 0533) Resp:  [16-18] 16  (09/23 0533) BP: (127-152)/(79-87) 131/81 mmHg (09/23 0533) SpO2:  [98 %-100 %] 100 % (09/23 0533)  Intake/Output from previous day: 09/22 0701 - 09/23 0700 In: 1200 [P.O.:1200] Out: -  Intake/Output this shift:    General: alert, in no distress.oral membranes moist. Neck supple. Lungs clear. Ht regular 2nd right toe is red nearly the whole length. No drainage from ulcer on tip. Alert awake, anxious  Lab Results   Basename 06/05/12 0600 06/04/12 2140  WBC 6.6 7.0  RBC 4.12* 4.36  HGB 13.2 13.8  HCT 37.6* 39.4  MCV 91.3 90.4  MCH 32.0 31.7  RDW 12.5 12.2  PLT 182 207    Basename 06/05/12 0600 06/04/12 2140  NA 141 140  K 3.7 4.0  CL 105 102  CO2 28 28  GLUCOSE 105* 100*  BUN 17 17  CREATININE 0.87 0.90  CALCIUM 9.0 9.6    Studies/Results: Dg Toe 2nd Right  06/04/2012  *RADIOLOGY REPORT*  Clinical Data: Diabetic with ulcer.  RIGTH SECOND TOE  Comparison: None.  Findings: Soft tissue swelling right second toe with minimal irregularity of the right second distal phalanx tuft.  Early osteomyelitis not entirely excluded.  Remote fracture right fourth metatarsal.  IMPRESSION:  Soft tissue swelling right second toe with minimal irregularity of the right second distal phalanx tuft.  Early osteomyelitis not entirely excluded.   Original Report Authenticated By: Fuller Canada, M.D.     Scheduled Meds:   . ALPRAZolam  3 mg Oral QHS  . enoxaparin (LOVENOX) injection  60 mg Subcutaneous Daily  . gadobenate dimeglumine  20 mL Intravenous Once  . insulin aspart  0-15 Units Subcutaneous TID WC  . insulin glargine  30 Units Subcutaneous QHS  . linagliptin  5 mg Oral Daily    . linagliptin  5 mg Oral Once  . piperacillin-tazobactam (ZOSYN)  IV  3.375 g Intravenous Q8H  . ramipril  10 mg Oral Daily  . ramipril  10 mg Oral Once  . Tamsulosin HCl  0.4 mg Oral QPC supper  . terbinafine  250 mg Oral Daily  . vancomycin  1,000 mg Intravenous Q12H  . DISCONTD: enoxaparin (LOVENOX) injection  40 mg Subcutaneous Daily  . DISCONTD: vancomycin  1,000 mg Intravenous Q8H   Continuous Infusions:   . 0.9 % NaCl with KCl 20 mEq / L 20 mL/hr (06/05/12 1211)   PRN Meds:acetaminophen, acetaminophen, alum & mag hydroxide-simeth, bisacodyl, oxyCODONE-acetaminophen, promethazine, DISCONTD: ALPRAZolam  Assessment/Plan: OSTEOMYELITIS OF TOE: MRI pending. This will determine disposition DM TYPE 1: doing pretty well overall DM RETINOPATHY, ADVANCED: stable HYPERTENSION: doing OK  ANXIETY: doing OK  LOS: 2 days   Billy [name] [name] 06/06/2012, 8:01 AM

## 2012-06-06 NOTE — Discharge Summary (Signed)
DISCHARGE SUMMARY  Billy [name]  MR#: [id number]  DOB:Jun 06, 1966  Date of Admission: 06/04/2012 Date of Discharge: 06/06/2012  Attending Physician:Billy [name]  Patient's PCP:No primary provider on file.  Consults:  none Procedures: MRI of the  Discharge Diagnoses: Principal Problem:  *Osteomyelitis of second right toe toe, clinically stable with equivocal MRI Active Problems:  Foot ulcer, right  Diabetes mellitus with ulcer of toe  Hypertension Anxiety, stable Type 2 diabetes, stable Severe proliferative diabetic retinopathy with vision loss Diabetic neuropathy with loss of protective sensation Claw toe deformity   Discharge Medications:   Medication List     As of 06/06/2012 12:58 PM    TAKE these medications         ALPRAZolam 1 MG tablet   Commonly known as: XANAX   Take 3 mg by mouth at bedtime as needed. For anxiety      doxycycline 100 MG tablet   Commonly known as: VIBRA-TABS   Take 1 tablet (100 mg total) by mouth every 12 (twelve) hours.      insulin glargine 100 UNIT/ML injection   Commonly known as: LANTUS   Inject 30 Units into the skin at bedtime.      ramipril 10 MG capsule   Commonly known as: ALTACE   Take 10 mg by mouth daily.      terbinafine 250 MG tablet   Commonly known as: LAMISIL   Take 250 mg by mouth daily.      TRADJENTA 5 MG Tabs tablet   Generic drug: linagliptin   Take 5 mg by mouth daily.        Hospital Procedures: Ct Abdomen Pelvis Wo Contrast  05/15/2012  *RADIOLOGY REPORT*  Clinical Data: Right flank pain and nausea for 1 day  CT ABDOMEN AND PELVIS WITHOUT CONTRAST  Technique:  Multidetector CT imaging of the abdomen and pelvis was performed following the standard protocol without intravenous contrast. Sagittal and coronal MPR images reconstructed from axial data set.  Comparison: None  Findings: Minimal subsegmental atelectasis at lung base. Tiny nonobstructing calculus lower pole right kidney image 47. Mild  right hydronephrosis, perinephric edema, and right hydroureter secondary to a 3 mm diameter distal right ureteral calculus just above the ureterovesicle junction. No additional urinary tract calcifications. Minimal nonspecific left perinephric stranding. Within limits of a nonenhanced exam, no focal abnormalities of the liver, spleen, pancreas, or adrenal glands.  Normal appendix. Stomach and bowel loops grossly unremarkable for technique. No mass, adenopathy, free fluid or inflammatory process. Calcified lymph node right external iliac. No acute osseous findings. Grade 1 anterolisthesis at L5-S1 secondary to bilateral spondylolysis L5.  IMPRESSION: Right hydronephrosis and hydroureter secondary to a 3 mm diameter distal right ureteral calculus just above the ureterovesicle junction.   Original Report Authenticated By: Lollie Marrow, M.D.    Mr Foot Right W Wo Contrast  06/06/2012  *RADIOLOGY REPORT*  Clinical Data: Soft tissue ulcer of the right second toe.  MRI OF THE RIGHT FOREFOOT WITHOUT AND WITH CONTRAST  Technique:  Multiplanar, multisequence MR imaging was performed both before and after administration of intravenous contrast.  Contrast: 20mL MULTIHANCE GADOBENATE DIMEGLUMINE 529 MG/ML IV SOLN  Comparison: Radiographs dated 06/04/2012  Findings: There is diffuse abnormal signal from the distal phalangeal bone of the second toe with edema and diffuse abnormal enhancement.  There is no focal bone destruction.  The patient has moderate osteoarthritic changes of the interphalangeal joint of the great toe. There is slight osteoarthritis of the first metatarsal phalangeal  joint.  There are no soft tissue abscesses.  There is slight enhancement of the soft tissues of the distal second toe consistent with cellulitis.  IMPRESSION:   Cellulitis of the distal aspect of the second toe.  Abnormal edema and enhancement of the distal phalangeal bone of the second toe could be due to hyperemia or osteomyelitis.  No  visible bone destruction.   Original Report Authenticated By: Gwynn Burly, M.D.    Dg Toe 2nd Right  06/04/2012  *RADIOLOGY REPORT*  Clinical Data: Diabetic with ulcer.  RIGTH SECOND TOE  Comparison: None.  Findings: Soft tissue swelling right second toe with minimal irregularity of the right second distal phalanx tuft.  Early osteomyelitis not entirely excluded.  Remote fracture right fourth metatarsal.  IMPRESSION:  Soft tissue swelling right second toe with minimal irregularity of the right second distal phalanx tuft.  Early osteomyelitis not entirely excluded.   Original Report Authenticated By: Fuller Canada, M.D.     History of Present Illness: Toe infection  Hospital Course: 46 year old white male with long-standing diabetes complicated by severe retinopathy and neuropathy who presented to my partner and was admitted with a right second toe infection with acsending cellulitis. He was treated with broad-spectrum antibiotics. He's received is now for 2 days. An MRI was finally completed this morning and is equivocal with no definitive evidence of osteomyelitis. See report below for details. The patient is done well with reasonable blood sugar control. He said no fevers or chills or sweats. His white blood cell count has done well. His blood pressure is done well. With these clinical findings I believe we can send him out on oral antibiotics. He still has a risk of needing surgery. Hopefully doxycycline will cover the bacteria involved. Even if this wound heals he may need straightening of the toe surgically. This is due to the insensate nature of his toe as well as the claw deformity  Day of Discharge Exam BP 131/81  Pulse 93  Temp 97.5 F (36.4 C) (Oral)  Resp 16  Ht 6\' 2"  (1.88 m)  Wt 111.131 kg (245 lb)  BMI 31.46 kg/m2  SpO2 100%  Physical Exam: General appearance: alert, sitting up in no distress face is symmetric oral mucous members are moist Resp: clear to auscultation  bilaterally, no wheezes rales or rhonchi Cardio: regular rate and rhythm, no murmur is present GI: soft, non-tender; bowel sounds normal; no masses,  no organomegaly Extremities: no clubbing, cyanosis or edema, pulses are intact. The end of the second toe shows a 3 x 5 mm ulcer that is about 2 mm deep. The base of it is red and shows no pus. The toe is red all the way back to the MTP joint. No expressible drainage is found. Neuro: Patient's awake alert and mentating well with some mild anxiety  Discharge Labs:  [facility] 06/06/12 0817 06/05/12 0600  NA 139 141  K 4.2 3.7  CL 104 105  CO2 25 28  GLUCOSE 201* 105*  BUN 23 17  CREATININE 1.10 0.87  CALCIUM 9.2 9.0  MG -- --  PHOS -- --    Basename 06/05/12 0600  AST 13  ALT 9  ALKPHOS 38*  BILITOT 0.5  PROT 6.7  ALBUMIN 3.7    Basename 06/06/12 0817 06/05/12 0600 06/04/12 2140  WBC 6.2 6.6 --  NEUTROABS -- -- 3.5  HGB 13.3 13.2 --  HCT 38.3* 37.6* --  MCV 90.5 91.3 --  PLT 206 182 --  Discharge instructions: Keep toe covered with a Band-Aid. Call if fevers chills or sweats.   Disposition: To home  Follow-up Appts: Follow-up with Dr. Evlyn Kanner in at [facility] in 1 week.  Call for appointment.  Condition on Discharge: Improved  Tests Needing Follow-up: None  Signed: Gisell Buehrle [name] 06/06/2012, 12:58 PM

## 2015-09-09 ENCOUNTER — Ambulatory Visit (INDEPENDENT_AMBULATORY_CARE_PROVIDER_SITE_OTHER): Payer: Medicare HMO | Admitting: Family Medicine

## 2015-09-09 VITALS — BP 150/90 | HR 96 | Temp 98.6°F | Resp 20 | Ht 72.0 in | Wt 262.8 lb

## 2015-09-09 DIAGNOSIS — J209 Acute bronchitis, unspecified: Secondary | ICD-10-CM | POA: Diagnosis not present

## 2015-09-09 DIAGNOSIS — R062 Wheezing: Secondary | ICD-10-CM

## 2015-09-09 DIAGNOSIS — Z794 Long term (current) use of insulin: Secondary | ICD-10-CM

## 2015-09-09 DIAGNOSIS — E118 Type 2 diabetes mellitus with unspecified complications: Secondary | ICD-10-CM

## 2015-09-09 LAB — HEMOGLOBIN A1C
Hgb A1c MFr Bld: 7.9 % — ABNORMAL HIGH (ref <5.7)
Mean Plasma Glucose: 180 mg/dL — ABNORMAL HIGH (ref <117)

## 2015-09-09 LAB — COMPREHENSIVE METABOLIC PANEL
ALT: 15 U/L (ref 9–46)
AST: 14 U/L (ref 10–35)
Albumin: 4.1 g/dL (ref 3.6–5.1)
Alkaline Phosphatase: 51 U/L (ref 40–115)
BUN: 16 mg/dL (ref 7–25)
CO2: 28 mmol/L (ref 20–31)
Calcium: 8.8 mg/dL (ref 8.6–10.3)
Chloride: 98 mmol/L (ref 98–110)
Creat: 1.02 mg/dL (ref 0.70–1.33)
Glucose, Bld: 271 mg/dL — ABNORMAL HIGH (ref 65–99)
Potassium: 4.9 mmol/L (ref 3.5–5.3)
Sodium: 135 mmol/L (ref 135–146)
Total Bilirubin: 0.5 mg/dL (ref 0.2–1.2)
Total Protein: 7.2 g/dL (ref 6.1–8.1)

## 2015-09-09 MED ORDER — DOXYCYCLINE HYCLATE 100 MG PO CAPS: 100.0000 mg | ORAL_CAPSULE | Freq: Two times a day (BID) | ORAL | Status: DC

## 2015-09-09 MED ORDER — ALBUTEROL SULFATE HFA 108 (90 BASE) MCG/ACT IN AERS: 2.0000 | INHALATION_SPRAY | Freq: Four times a day (QID) | RESPIRATORY_TRACT | Status: DC | PRN

## 2015-09-09 NOTE — Progress Notes (Signed)
[facility] [address] [place] [address] [phone]  Date:  09/09/2015   Name:  Billy [name]   DOB:  08-09-1966   MRN:  [id number]  PCP:  No primary care provider on file.    Chief Complaint: Sore Throat; Cough; and Headache   History of Present Illness:  Billy [name] is a 49 y.o. very pleasant male patient who presents with the following:  History of DM, osteo of his toe. Here today as a new patient with concern of illness-  He has been sick for 3-4 days.  He notes chest congestion, cough.  He is coughing up a lot of phlegm.  "I mean a lot!"  He has not noted a fever.  No aches or chills. Some sinus congestion He felt nauseated last night from coughing so much.    He has never been a smoker He lives alone- no known sick contacts He has tried some OTC cold preps, some essential oil medication for nasal congestion  His DM is managed by Dr. Forde Dandy. He is overdue for labs because he had to cancel an appt- he would appreciate if we would check labs for him today  He did have SVT and an ablation about 25 years ago- has not recurred He has used albuterol in the past without any difficulty    Patient Active Problem List   Diagnosis Date Noted  . Foot ulcer, right ([place]) 06/05/2012    Class: Acute  . Osteomyelitis of toe ([place]) 06/05/2012    Class: Acute  . Diabetes mellitus with ulcer of toe ([place]) 06/05/2012    Class: Acute  . Hypertension 06/05/2012    Class: Chronic    Past Medical History  Diagnosis Date  . Diabetes mellitus   . H/O prior ablation treatment   . SVT (supraventricular tachycardia) [facility])     Past Surgical History  Procedure Laterality Date  . Eye surgery      Social History  Substance Use Topics  . Smoking status: Never Smoker   . Smokeless tobacco: None  . Alcohol Use: No    History reviewed. No pertinent family history.  No Known Allergies  Medication list has been reviewed and updated.  Current Outpatient  Prescriptions on File Prior to Visit  Medication Sig Dispense Refill  . ALPRAZolam (XANAX) 1 MG tablet Take 3 mg by mouth at bedtime as needed. Reported on 09/09/2015    . insulin glargine (LANTUS) 100 UNIT/ML injection Inject 30 Units into the skin at bedtime. Reported on 09/09/2015    . linagliptin (TRADJENTA) 5 MG TABS tablet Take 5 mg by mouth daily. Reported on 09/09/2015    . ramipril (ALTACE) 10 MG capsule Take 10 mg by mouth daily. Reported on 09/09/2015    . terbinafine (LAMISIL) 250 MG tablet Take 250 mg by mouth daily. Reported on 09/09/2015    . doxycycline (VIBRA-TABS) 100 MG tablet Take 1 tablet (100 mg total) by mouth every 12 (twelve) hours. (Patient not taking: Reported on 09/09/2015) 28 tablet 0   No current facility-administered medications on file prior to visit.    Review of Systems:  As per HPI- otherwise negative.   Physical Examination: Filed Vitals:   09/09/15 1615  BP: 150/90  Pulse: 106  Temp: 98.6 F (37 C)  Resp: 20   Filed Vitals:   09/09/15 1615  Height: 6' (1.829 m)  Weight: 262 lb 12.8 oz (119.205 kg)   Body mass index is 35.63  kg/(m^2). Ideal Body Weight: Weight in (lb) to have BMI = 25: 183.9  GEN: WDWN, NAD, Non-toxic, A & O x 3, obese, looks well HEENT: Atraumatic, Normocephalic. Neck supple. No masses, No LAD.  Marland Kitchenene Ears and Nose: No external deformity.   CV: RRR, No M/G/R. No JVD. No thrill. No extra heart sounds. PULM: CTA B, no wheezes, crackles, rhonchi. No retractions. No resp. distress. No accessory muscle use. EXTR: No c/c/e NEURO Normal gait.  PSYCH: Normally interactive. Conversant. Not depressed or anxious appearing.  Calm demeanor.    Assessment and Plan: Acute bronchitis, unspecified organism - Plan: doxycycline (VIBRAMYCIN) 100 MG capsule  Wheezing - Plan: albuterol (PROVENTIL HFA;VENTOLIN HFA) 108 (90 BASE) MCG/ACT inhaler  Controlled type 2 diabetes mellitus with complication, with long-term current use of insulin  (HCC) - Plan: Comprehensive metabolic panel, Hemoglobin A1c  Here today with wheezing and cough.  This pt is a bit more fragile so will cover with doxycycline Check labs as above Albuterol as needed He will let me know if not feeling better soon   Signed Lamar Blinks, MD

## 2015-09-09 NOTE — Patient Instructions (Signed)
We are going to use doxycycline (antibiotic) for your bronchitis and the albuterol inhaler as needed for wheezing.  I will mail you a copy of your labs to show to Dr. Forde Dandy  Let me know if you do not feel better in the next few days- Sooner if worse.

## 2015-09-10 ENCOUNTER — Encounter: Payer: Self-pay | Admitting: Family Medicine

## 2015-09-27 DIAGNOSIS — E11621 Type 2 diabetes mellitus with foot ulcer: Secondary | ICD-10-CM | POA: Diagnosis not present

## 2015-09-27 DIAGNOSIS — E1139 Type 2 diabetes mellitus with other diabetic ophthalmic complication: Secondary | ICD-10-CM | POA: Diagnosis not present

## 2015-09-27 DIAGNOSIS — E1140 Type 2 diabetes mellitus with diabetic neuropathy, unspecified: Secondary | ICD-10-CM | POA: Diagnosis not present

## 2015-09-27 DIAGNOSIS — S90425A Blister (nonthermal), left lesser toe(s), initial encounter: Secondary | ICD-10-CM | POA: Diagnosis not present

## 2015-09-30 DIAGNOSIS — E11621 Type 2 diabetes mellitus with foot ulcer: Secondary | ICD-10-CM | POA: Diagnosis not present

## 2015-09-30 DIAGNOSIS — Z6832 Body mass index (BMI) 32.0-32.9, adult: Secondary | ICD-10-CM | POA: Diagnosis not present

## 2015-09-30 DIAGNOSIS — S90425D Blister (nonthermal), left lesser toe(s), subsequent encounter: Secondary | ICD-10-CM | POA: Diagnosis not present

## 2015-09-30 DIAGNOSIS — E1139 Type 2 diabetes mellitus with other diabetic ophthalmic complication: Secondary | ICD-10-CM | POA: Diagnosis not present

## 2015-10-02 DIAGNOSIS — Z6833 Body mass index (BMI) 33.0-33.9, adult: Secondary | ICD-10-CM | POA: Diagnosis not present

## 2015-10-02 DIAGNOSIS — E1140 Type 2 diabetes mellitus with diabetic neuropathy, unspecified: Secondary | ICD-10-CM | POA: Diagnosis not present

## 2015-10-02 DIAGNOSIS — I10 Essential (primary) hypertension: Secondary | ICD-10-CM | POA: Diagnosis not present

## 2015-10-02 DIAGNOSIS — N183 Chronic kidney disease, stage 3 (moderate): Secondary | ICD-10-CM | POA: Diagnosis not present

## 2015-10-02 DIAGNOSIS — R69 Illness, unspecified: Secondary | ICD-10-CM | POA: Diagnosis not present

## 2015-10-02 DIAGNOSIS — S90425S Blister (nonthermal), left lesser toe(s), sequela: Secondary | ICD-10-CM | POA: Diagnosis not present

## 2015-10-02 DIAGNOSIS — E11621 Type 2 diabetes mellitus with foot ulcer: Secondary | ICD-10-CM | POA: Diagnosis not present

## 2015-10-02 DIAGNOSIS — E1139 Type 2 diabetes mellitus with other diabetic ophthalmic complication: Secondary | ICD-10-CM | POA: Diagnosis not present

## 2015-10-03 DIAGNOSIS — R69 Illness, unspecified: Secondary | ICD-10-CM | POA: Diagnosis not present

## 2015-10-04 DIAGNOSIS — E11621 Type 2 diabetes mellitus with foot ulcer: Secondary | ICD-10-CM | POA: Diagnosis not present

## 2015-10-04 DIAGNOSIS — E1139 Type 2 diabetes mellitus with other diabetic ophthalmic complication: Secondary | ICD-10-CM | POA: Diagnosis not present

## 2015-10-04 DIAGNOSIS — N183 Chronic kidney disease, stage 3 (moderate): Secondary | ICD-10-CM | POA: Diagnosis not present

## 2015-10-04 DIAGNOSIS — E1140 Type 2 diabetes mellitus with diabetic neuropathy, unspecified: Secondary | ICD-10-CM | POA: Diagnosis not present

## 2015-10-04 DIAGNOSIS — Z6833 Body mass index (BMI) 33.0-33.9, adult: Secondary | ICD-10-CM | POA: Diagnosis not present

## 2015-10-04 DIAGNOSIS — R69 Illness, unspecified: Secondary | ICD-10-CM | POA: Diagnosis not present

## 2015-10-09 ENCOUNTER — Telehealth: Payer: Self-pay | Admitting: *Deleted

## 2015-10-09 DIAGNOSIS — E1139 Type 2 diabetes mellitus with other diabetic ophthalmic complication: Secondary | ICD-10-CM | POA: Diagnosis not present

## 2015-10-09 DIAGNOSIS — E11621 Type 2 diabetes mellitus with foot ulcer: Secondary | ICD-10-CM | POA: Diagnosis not present

## 2015-10-09 DIAGNOSIS — E1140 Type 2 diabetes mellitus with diabetic neuropathy, unspecified: Secondary | ICD-10-CM | POA: Diagnosis not present

## 2015-10-09 DIAGNOSIS — Z6833 Body mass index (BMI) 33.0-33.9, adult: Secondary | ICD-10-CM | POA: Diagnosis not present

## 2015-10-09 DIAGNOSIS — E11319 Type 2 diabetes mellitus with unspecified diabetic retinopathy without macular edema: Secondary | ICD-10-CM | POA: Diagnosis not present

## 2015-10-09 NOTE — Telephone Encounter (Addendum)
Nurse Practioner, Colletta [place] states pt is diabetic and has ulcers on left 3rd and 5th toes and they would like to refer him to our office for treatment tomorrow afternoon.  10/15/2015 - PT CALLED states his is having a yellowish drainage from the toes, but denies increase of redness or swelling, but is concerned the Doxycycline is not strong enough or he may be resistant, due to being on the medication for a long time for bronchitis.  I told pt to continue the care at home as prescribed by Dr. Prudence Davidson, and I would inform Dr. Prudence Davidson of his concerns and call again.  10/16/2015 - LEFT MESSAGE for pt to call for more information concerning the treatment of his foot. 10/17/2015 - PT RETURNED my call of 10/16/2015 states he is still getting yellowish drainage on the bandages at each change.  I told pt we would call in antibiotics to his pharmacy.  Dr. Burnell Blanks orders called to [place].

## 2015-10-10 ENCOUNTER — Ambulatory Visit (INDEPENDENT_AMBULATORY_CARE_PROVIDER_SITE_OTHER): Payer: Medicare HMO | Admitting: Podiatry

## 2015-10-10 VITALS — BP 164/102 | HR 92 | Resp 14

## 2015-10-10 DIAGNOSIS — L97521 Non-pressure chronic ulcer of other part of left foot limited to breakdown of skin: Secondary | ICD-10-CM

## 2015-10-10 DIAGNOSIS — L89891 Pressure ulcer of other site, stage 1: Secondary | ICD-10-CM

## 2015-10-10 DIAGNOSIS — E1149 Type 2 diabetes mellitus with other diabetic neurological complication: Secondary | ICD-10-CM

## 2015-10-10 NOTE — Progress Notes (Signed)
   Subjective:    Patient ID: Billy [name], male    DOB: May 07, 1966, 50 y.o.   MRN: [id number]  HPI this patient presents to my office with chief complaint of open wounds on his third, fourth and fifth toes of his left foot. This patient is an insulin-dependent diabetic who states that he wore a pair of shoes during the course of the day and after removing his shoes noted. There was drainage noted from the third, fourth and fifth toes of the left foot. He states he was seen by Dr. Edilia Bo on 09/09/2015 and received a prescription for doxycycline for bronchitis. He presents the office today saying that he received a prescription for doxycycline to be taken over the next week. He presents the office wearing no bandages at this time. He states that his fifth toe is his most serious, but he is not experiencing significant pain and discomfort at this time, minimal drainage from the sites. According to the patient. He presents the office for an evaluation and treatment of these conditions.  It is noted that he has eyesight problems during this visit. The patient is here today referred by Dr. Baldwin Crown office for left foot, 3rd,4th and side of the 5th toe sores.   Review of Systems  Constitutional: Positive for activity change and fatigue.  Eyes: Positive for visual disturbance.  Cardiovascular: Positive for palpitations.  Endocrine: Positive for heat intolerance.  Musculoskeletal:       Joint pain  Skin:       Open sores on toes  Hematological:       Slow to heal  Psychiatric/Behavioral: The patient is nervous/anxious.        Objective:   Physical Exam GENERAL APPEARANCE: Alert, conversant. Appropriately groomed. No acute distress.  VASCULAR: Pedal pulses palpable at  [facility] and PT bilateral.  Capillary refill time is immediate to all digits,  Normal temperature gradient.  Digital hair growth is present bilateral  NEUROLOGIC: sensation is diminished  to 5.07 monofilament at 5/5 sites bilateral.   Light touch is intact bilateral, Muscle strength normal.  MUSCULOSKELETAL: acceptable muscle strength, tone and stability bilateral.  Intrinsic muscluature intact bilateral.  Rectus appearance of foot and digits noted bilateral.   DERMATOLOGIC: skin color, texture, and turgor are within normal limits.  no interdigital maceration noted.  No open lesions present.  Digital nails are asymptomatic. No drainage noted. There is a 4 mm. X 4 mm. Ulcer on dorsum of third toe with necrotic coverage but no redness or swelling or pain.  Ulcer on fourth toe is healing with no drainage redness or swelling.  His fifth toe has ulcer on lateral aspect fifth toe measuring 10 mm. X 20 mm.  Necrotic tissue covering the ulcer in absence of redness or swelling.            Assessment & Plan:  Diabetic ulcer 3,4 and 5 left foot.  IE  Debride necrotic tissue 3,5 left foot and bandaged with neosporin/DSD.  Home soaks prescribed with bandaging instructions.  No x-rays taken since no x-ray capabilities at [facility] office.  To take x-rays next week at [facility] if they are warranted.  Told to continue doxycycline previously prescribed.  Continue wearing cam walker until foot is healed.  RTC 1 week. Discussed his pathology and believe this may be caused by this socks worn incorrectedly with his shoes.  We will discuss better footwear after toes heal.  Gardiner Barefoot DPM

## 2015-10-15 DIAGNOSIS — R21 Rash and other nonspecific skin eruption: Secondary | ICD-10-CM | POA: Diagnosis not present

## 2015-10-15 DIAGNOSIS — L738 Other specified follicular disorders: Secondary | ICD-10-CM | POA: Diagnosis not present

## 2015-10-17 MED ORDER — CIPROFLOXACIN HCL 500 MG PO TABS: 500.0000 mg | ORAL_TABLET | Freq: Two times a day (BID) | ORAL | Status: DC

## 2015-10-17 MED ORDER — CLINDAMYCIN HCL 300 MG PO CAPS: 300.0000 mg | ORAL_CAPSULE | Freq: Three times a day (TID) | ORAL | Status: DC

## 2015-10-18 ENCOUNTER — Encounter: Payer: Self-pay | Admitting: Podiatry

## 2015-10-18 ENCOUNTER — Ambulatory Visit (INDEPENDENT_AMBULATORY_CARE_PROVIDER_SITE_OTHER): Payer: Medicare HMO | Admitting: Podiatry

## 2015-10-18 VITALS — BP 196/103 | HR 84 | Resp 14

## 2015-10-18 DIAGNOSIS — L89891 Pressure ulcer of other site, stage 1: Secondary | ICD-10-CM | POA: Diagnosis not present

## 2015-10-18 DIAGNOSIS — E1149 Type 2 diabetes mellitus with other diabetic neurological complication: Secondary | ICD-10-CM

## 2015-10-18 DIAGNOSIS — L97521 Non-pressure chronic ulcer of other part of left foot limited to breakdown of skin: Secondary | ICD-10-CM

## 2015-10-18 NOTE — Progress Notes (Signed)
Subjective:     Patient ID: Billy [name], male   DOB: 1965/10/22, 50 y.o.   MRN: [id number]  HPI this insulin-dependent diabetic returns to the office follow-up for diagnosis of ulcers on his third, fourth and fifth toes, left foot. He states that his foot has been soaked and  Bandaged since his last visit. He admits to taking antibiotics as well as having the antibiotics changed to clindamycin and Cipro. He says his fifth toe left foot is less painful since the last visit. He presents the office stating there is minimal drainage from the ulcer of the fifth toe but the other lesions. have  no drainage. He walks in his cam walker.   Review of Systems     Objective:   Physical Exam Physical Exam GENERAL APPEARANCE: Alert, conversant. Appropriately groomed. No acute distress.  VASCULAR: Pedal pulses palpable at [facility] and PT bilateral. Capillary refill time is immediate to all digits, Normal temperature gradient. Digital hair growth is present bilateral  NEUROLOGIC: sensation is diminished to 5.07 monofilament at 5/5 sites bilateral. Light touch is intact bilateral, Muscle strength normal.  MUSCULOSKELETAL: acceptable muscle strength, tone and stability bilateral. Intrinsic muscluature intact bilateral. Rectus appearance of foot and digits noted bilateral.   DERMATOLOGIC: skin color, texture, and turgor are within normal limits. no interdigital maceration noted. . Digital nails are asymptomatic.  There is a 4 mm. X 4 mm. ulcer on dorsum of third toet no redness or swelling or pain. Ulcer on fourth toe is healing with no drainage redness or swelling. His fifth toe has ulcer on lateral aspect fifth toe has decresed in size.. Normal granulation  tissue covering the ulcer in absence of redness or swelling. The ulcer has dead tissue outside ulcer fifth toe left foot.     Assessment:     Diabetic ulcer left foot     Plan:    ROV  Debride necrotic tissue.  Continue antibiotics.  Continue  soaks.  Continue cam walker.  RTC 3 weeks for re-evaluation.   Gardiner Barefoot DPM

## 2015-11-01 ENCOUNTER — Encounter: Payer: Self-pay | Admitting: Podiatry

## 2015-11-01 ENCOUNTER — Ambulatory Visit (INDEPENDENT_AMBULATORY_CARE_PROVIDER_SITE_OTHER): Payer: Medicare HMO | Admitting: Podiatry

## 2015-11-01 VITALS — BP 193/95 | HR 85 | Resp 16

## 2015-11-01 DIAGNOSIS — H3582 Retinal ischemia: Secondary | ICD-10-CM | POA: Diagnosis not present

## 2015-11-01 DIAGNOSIS — L97521 Non-pressure chronic ulcer of other part of left foot limited to breakdown of skin: Secondary | ICD-10-CM

## 2015-11-01 DIAGNOSIS — E113593 Type 2 diabetes mellitus with proliferative diabetic retinopathy without macular edema, bilateral: Secondary | ICD-10-CM | POA: Diagnosis not present

## 2015-11-01 DIAGNOSIS — E1149 Type 2 diabetes mellitus with other diabetic neurological complication: Secondary | ICD-10-CM

## 2015-11-01 DIAGNOSIS — H35373 Puckering of macula, bilateral: Secondary | ICD-10-CM | POA: Diagnosis not present

## 2015-11-01 NOTE — Progress Notes (Signed)
Subjective:     Patient ID: Billy [name], male   DOB: Mar 10, 1966, 50 y.o.   MRN: [id number]  HPI this insulin-dependent diabetic, presents the office for continued evaluation of his third, fourth and fifth toes, left foot. He says he is not having any pain or discomfort in his toes. At this point, he admits that the drainage in the fifth toe left foot has stopped. He presents the office still wearing his Cam Walker. He presents the office for continued evaluation and treatment of his ulcers Review of Systems     Objective:   Physical Exam     Physical Exam Physical Exam GENERAL APPEARANCE: Alert, conversant. Appropriately groomed. No acute distress.  VASCULAR: Pedal pulses palpable at [facility] and PT bilateral. Capillary refill time is immediate to all digits, Normal temperature gradient. Digital hair growth is present bilateral  NEUROLOGIC: sensation is diminished to 5.07 monofilament at 5/5 sites bilateral. Light touch is intact bilateral, Muscle strength normal.  MUSCULOSKELETAL: acceptable muscle strength, tone and stability bilateral. Intrinsic muscluature intact bilateral. Rectus appearance of foot and digits noted bilateral.   DERMATOLOGIC: skin color, texture, and turgor are within normal limits. no interdigital maceration noted. . Digital nails are asymptomatic. There is  A healing  ulcer on dorsum of third toet no redness or swelling or pain. Ulcer on fourth toe is healed  with no drainage redness or swelling. His fifth toe has ulcer on lateral aspect fifth toe has decreased in size.. Normal granulation tissue covering the ulcer in absence of redness or swelling. The ulcer has dead tissue outside ulcer fifth toe left foot.Ulcer is closing with granulation tissue noted.            Assessment:     Diabetic ulcer left foot     Plan:     ROV  Patient healing well.  RTC 4 weeks.  Wear shoes that put no pressure on his healing fifth toe.  Gardiner Barefoot DPM

## 2015-11-26 ENCOUNTER — Ambulatory Visit: Payer: Medicare HMO | Admitting: Podiatry

## 2016-02-26 ENCOUNTER — Other Ambulatory Visit: Payer: Self-pay | Admitting: Endocrinology

## 2016-02-26 DIAGNOSIS — Z1389 Encounter for screening for other disorder: Secondary | ICD-10-CM | POA: Diagnosis not present

## 2016-02-26 DIAGNOSIS — B351 Tinea unguium: Secondary | ICD-10-CM | POA: Diagnosis not present

## 2016-02-26 DIAGNOSIS — E11319 Type 2 diabetes mellitus with unspecified diabetic retinopathy without macular edema: Secondary | ICD-10-CM | POA: Diagnosis not present

## 2016-02-26 DIAGNOSIS — E784 Other hyperlipidemia: Secondary | ICD-10-CM | POA: Diagnosis not present

## 2016-02-26 DIAGNOSIS — Z6833 Body mass index (BMI) 33.0-33.9, adult: Secondary | ICD-10-CM | POA: Diagnosis not present

## 2016-02-26 DIAGNOSIS — I10 Essential (primary) hypertension: Secondary | ICD-10-CM | POA: Diagnosis not present

## 2016-02-26 DIAGNOSIS — R1011 Right upper quadrant pain: Secondary | ICD-10-CM | POA: Diagnosis not present

## 2016-02-26 DIAGNOSIS — R69 Illness, unspecified: Secondary | ICD-10-CM | POA: Diagnosis not present

## 2016-02-26 DIAGNOSIS — N183 Chronic kidney disease, stage 3 (moderate): Secondary | ICD-10-CM | POA: Diagnosis not present

## 2016-03-03 ENCOUNTER — Encounter: Payer: Self-pay | Admitting: Gastroenterology

## 2016-03-04 ENCOUNTER — Encounter: Payer: Self-pay | Admitting: Gastroenterology

## 2016-03-04 ENCOUNTER — Ambulatory Visit (INDEPENDENT_AMBULATORY_CARE_PROVIDER_SITE_OTHER): Payer: Medicare HMO | Admitting: Gastroenterology

## 2016-03-04 ENCOUNTER — Ambulatory Visit: Payer: Medicaid Other | Admitting: Gastroenterology

## 2016-03-04 VITALS — BP 140/90 | HR 80 | Ht 72.0 in | Wt 275.0 lb

## 2016-03-04 DIAGNOSIS — R1011 Right upper quadrant pain: Secondary | ICD-10-CM | POA: Diagnosis not present

## 2016-03-04 DIAGNOSIS — R1031 Right lower quadrant pain: Secondary | ICD-10-CM | POA: Diagnosis not present

## 2016-03-04 LAB — BUN & CREATININE (CHCC)
BUN: 31
Creatine, Serum: 1.1

## 2016-03-04 NOTE — Progress Notes (Signed)
HPI: This is a   very pleasant 50 year old man    who was referred to me by Reynold Bowen, MD  to evaluate  right-sided abdominal pain .    Chief complaint is right-sided abdominal pain  Right lower quadrant pains.  Started about 6 weeks ago.  Started after eating a lot of small pies (9 fried pies from Bobby's grandmother).  The pain waxes and wanes.  Not overly painful.  Worse walking.  He has no nausea or vomiting with it.  This is not like his kidney stone.  Bowels not really changed recently.  Overall his weight is down 10 pounds in 3 months (semi-intentionally).  No FH of colon cancer  Father had pancreatic cancer.  Takes xanax tid  No NSAIDs  Blood work June 2017. Normal CBC, elevated cholesterol levels, normal liver tests.  Abdominal ultrasound was ordered by his primary care physician however that has not been done yet.  Review of systems: Pertinent positive and negative review of systems were noted in the above HPI section. Complete review of systems was performed and was otherwise normal.   Past Medical History  Diagnosis Date  . Diabetes mellitus   . H/O prior ablation treatment   . SVT (supraventricular tachycardia) (HCC)   obesity BMI 37  Past Surgical History  Procedure Laterality Date  . Eye surgery      Current Outpatient Prescriptions  Medication Sig Dispense Refill  . alprazolam (XANAX) 2 MG tablet TAKE 1 TABLET UP TO 3 TIMES A DAY AS NEEDED FOR ANXIETY  3  . B-D ULTRAFINE III SHORT PEN 31G X 8 MM MISC USE TO ADMINISTER INSULIN DAILY E11..39  2  . cyclobenzaprine (FLEXERIL) 10 MG tablet     . insulin glargine (LANTUS) 100 UNIT/ML injection Inject 30 Units into the skin at bedtime. Reported on 09/09/2015    . Lancets (FREESTYLE) lancets CHECK BLOOD SUGAR 3 TIMES DAILY  6  . LEVEMIR FLEXTOUCH 100 UNIT/ML Pen INJECT 30 UNITS DAILY AT BEDTIME  5  . linagliptin (TRADJENTA) 5 MG TABS tablet Take 5 mg by mouth daily. Reported on 09/09/2015    . mupirocin  ointment (BACTROBAN) 2 % Place 1 application into the nose 2 (two) times daily.    . ramipril (ALTACE) 10 MG capsule Take 10 mg by mouth daily. Reported on 09/09/2015    . rosuvastatin (CRESTOR) 20 MG tablet Take 20 mg by mouth daily.    Marland Kitchen zolpidem (AMBIEN) 10 MG tablet Take 10 mg by mouth at bedtime as needed. for sleep  2   No current facility-administered medications for this visit.    Allergies as of 03/04/2016  . (No Known Allergies)    Family History  Problem Relation Age of Onset  . Diabetes Mother   . Pancreatic cancer Father   . Diabetes Father     Social History   Social History  . Marital Status: Single    Spouse Name: N/A  . Number of Children: N/A  . Years of Education: N/A   Occupational History  . Not on file.   Social History Main Topics  . Smoking status: Never Smoker   . Smokeless tobacco: Never Used  . Alcohol Use: No  . Drug Use: Yes    Special: Marijuana  . Sexual Activity: Not on file   Other Topics Concern  . Not on file   Social History Narrative     Physical Exam: BP 140/90 mmHg  Pulse 80  Ht 6' (1.829 m)  Wt 275 lb (124.739 kg)  BMI 37.29 kg/m2 Constitutional: generally well-appearing Psychiatric: alert and oriented x3 Eyes: extraocular movements intact Mouth: oral pharynx moist, no lesions Neck: supple no lymphadenopathy Cardiovascular: heart regular rate and rhythm Lungs: clear to auscultation bilaterally Abdomen: soft, Mildly tender right lower quadrant, nondistended, no obvious ascites, no peritoneal signs, normal bowel sounds Extremities: no lower extremity edema bilaterally Skin: no lesions on visible extremities   Assessment and plan: 50 y.o. male with  right-sided abdominal pain  CBC, complete metabolic profile were normal. He is slightly tender in the right flank, right lower quadrant. I do not think abdominal ultrasound is necessarily the best first imaging test and so I told him he can cancel that. Instead I'm  going to order a CT scan abdomen and pelvis with IV and oral contrast. If this is not helpful for making a firm diagnosis then he will need likely to have a colonoscopy.    Owens Loffler, MD [facility] 03/04/2016, 2:30 PM  Cc: Reynold Bowen, MD

## 2016-03-04 NOTE — Patient Instructions (Addendum)
You will be set up for a CT scan of abdomen and pelvis with IV and oral contrast.  You have been scheduled for a CT scan of the abdomen and pelvis at [facility] ([address]---this is in the same building as Press [facility]).   You are scheduled on 03/06/16 at 3 pm. You should arrive 15 minutes prior to your appointment time for registration. Please follow the written instructions below on the day of your exam:  WARNING: IF YOU ARE ALLERGIC TO IODINE/X-RAY DYE, PLEASE NOTIFY RADIOLOGY IMMEDIATELY AT [phone]! YOU WILL BE GIVEN A 13 HOUR PREMEDICATION PREP.  1) Do not eat or drink anything after 11 am (4 hours prior to your test) 2) You have been given 2 bottles of oral contrast to drink. The solution may taste better if refrigerated, but do NOT add ice or any other liquid to this solution. Shake well before drinking.    Drink 1 bottle of contrast @ 1 pm (2 hours prior to your exam)  Drink 1 bottle of contrast @ 2 pm (1 hour prior to your exam)  You may take any medications as prescribed with a small amount of water except for the following: Metformin, Glucophage, Glucovance, Avandamet, Riomet, Fortamet, Actoplus Met, Janumet, Glumetza or Metaglip. The above medications must be held the day of the exam AND 48 hours after the exam.  The purpose of you drinking the oral contrast is to aid in the visualization of your intestinal tract. The contrast solution may cause some diarrhea. Before your exam is started, you will be given a small amount of fluid to drink. Depending on your individual set of symptoms, you may also receive an intravenous injection of x-ray contrast/dye. Plan on being at [facility] for 30 minutes or longer, depending on the type of exam you are having performed.  This test typically takes 30-45 minutes to complete.  If you have any questions regarding your exam or if you need to reschedule, you may call the CT department at [phone] between the  hours of 8:00 am and 5:00 pm, Monday-Friday.  ________________________________________________________________________

## 2016-03-05 ENCOUNTER — Telehealth: Payer: Self-pay

## 2016-03-05 ENCOUNTER — Other Ambulatory Visit: Payer: Medicaid Other

## 2016-03-05 NOTE — Telephone Encounter (Signed)
Amy notified of Dr Ardis Hughs recommendations.  RLQ pain added to the diagnosis code.

## 2016-03-05 NOTE — Telephone Encounter (Signed)
Dr Ardis Hughs can we just do the CT abd because the insurance is not covering the pelvis?

## 2016-03-05 NOTE — Telephone Encounter (Signed)
No, he has RLQ and right sided abd pain.  Needs abd/pelvis

## 2016-03-05 NOTE — Telephone Encounter (Signed)
-----   Message from Darden Dates sent at 03/05/2016  7:54 AM EDT ----- Regarding: PRECERT FOR CT ORDERED YESTERDAY HEY Macaulay Reicher, WORKING ON THIS CT ABD/PELVIS THAT DR. Ardis Hughs ORDERED.  THE DX CODE IS "RUQ PAIN".   COUPLE THINGS... IN HIS NOTE HE TALKS MORE ABOUT "LOWER RIGHT PAIN" (NEED TO CLARIFY THE DX CODE THAT IS BEING USED) THERE IS ABSOLUTELY NOTHING TO GO ON TO SUPPORT THE PELVIS.  ( NO BOWEL ISSUES, RECTAL BLEEDING, ETC) HE CANCELLED THE ABD ULTRASOUND.  THAT IS ONE OF THE FIRST QUESTIONS THAT IS ASKED WHEN YOU ORDER A CT. (IF IT WAS DONE, THEY WANT IMPRESSION) HE DOESN'T STATE ANY WHERE IN THE NOTE WHAT HIS INDICATION FOR ORDERING THE TEST IS. IM PRETTY SURE THIS WILL BE DENIED. LET ME KNOW IF YOU CAN FIND OUT SOME MORE INFORMATION. HE IS SCHEDULED FOR TOMORROW AND I HAVE TO GET APPROVAL ASAP TODAY. THANKS AMY

## 2016-03-05 NOTE — Telephone Encounter (Signed)
This should be for RLQ, right sided abd pain.  Thanks

## 2016-03-05 NOTE — Telephone Encounter (Signed)
So is it ok to do just the Abdomen?

## 2016-03-06 ENCOUNTER — Ambulatory Visit (INDEPENDENT_AMBULATORY_CARE_PROVIDER_SITE_OTHER)
Admission: RE | Admit: 2016-03-06 | Discharge: 2016-03-06 | Disposition: A | Discharge status: Home / Self Care | Payer: Medicare HMO | Source: Ambulatory Visit | Attending: Gastroenterology | Admitting: Gastroenterology

## 2016-03-06 DIAGNOSIS — R1011 Right upper quadrant pain: Secondary | ICD-10-CM

## 2016-03-06 MED ORDER — IOPAMIDOL (ISOVUE-300) INJECTION 61%
100.0000 mL | Freq: Once | INTRAVENOUS | Status: AC | PRN
  Administered 2016-03-06: 100 mL via INTRAVENOUS

## 2016-03-12 ENCOUNTER — Telehealth: Payer: Self-pay | Admitting: Gastroenterology

## 2016-03-12 NOTE — Telephone Encounter (Signed)
The pt was notified that the results will be reviewed when Dr Ardis Hughs returns on Monday

## 2016-04-10 DIAGNOSIS — R69 Illness, unspecified: Secondary | ICD-10-CM | POA: Diagnosis not present

## 2016-04-28 ENCOUNTER — Encounter (INDEPENDENT_AMBULATORY_CARE_PROVIDER_SITE_OTHER): Payer: Self-pay

## 2016-04-28 ENCOUNTER — Telehealth: Payer: Self-pay | Admitting: Gastroenterology

## 2016-04-28 ENCOUNTER — Ambulatory Visit (AMBULATORY_SURGERY_CENTER): Payer: Medicaid Other

## 2016-04-28 VITALS — Ht 74.0 in | Wt 264.6 lb

## 2016-04-28 DIAGNOSIS — E113593 Type 2 diabetes mellitus with proliferative diabetic retinopathy without macular edema, bilateral: Secondary | ICD-10-CM | POA: Diagnosis not present

## 2016-04-28 DIAGNOSIS — H35373 Puckering of macula, bilateral: Secondary | ICD-10-CM | POA: Diagnosis not present

## 2016-04-28 DIAGNOSIS — H3582 Retinal ischemia: Secondary | ICD-10-CM | POA: Diagnosis not present

## 2016-04-28 DIAGNOSIS — R1031 Right lower quadrant pain: Secondary | ICD-10-CM

## 2016-04-28 MED ORDER — SUPREP BOWEL PREP KIT 17.5-3.13-1.6 GM/177ML PO SOLN: 1.0000 | Freq: Once | ORAL | 0 refills | Status: AC

## 2016-04-28 NOTE — Progress Notes (Signed)
No allergies to eggs or soy No past problems with anesthesia No diet meds No home oxygen  Has email and internet registered for emmi 

## 2016-04-28 NOTE — Telephone Encounter (Signed)
Called pt back and advised to just avoid hummus for now because sometimes it has nuts in it and it is made from chick peas. Pt verbalized understanding-adm

## 2016-05-05 ENCOUNTER — Encounter: Payer: Self-pay | Admitting: Gastroenterology

## 2016-05-05 ENCOUNTER — Ambulatory Visit (AMBULATORY_SURGERY_CENTER): Payer: Medicare HMO | Admitting: Gastroenterology

## 2016-05-05 VITALS — BP 117/77 | HR 87 | Temp 97.3°F | Resp 22 | Ht 74.0 in | Wt 264.0 lb

## 2016-05-05 DIAGNOSIS — D124 Benign neoplasm of descending colon: Secondary | ICD-10-CM

## 2016-05-05 DIAGNOSIS — R1031 Right lower quadrant pain: Secondary | ICD-10-CM

## 2016-05-05 DIAGNOSIS — I10 Essential (primary) hypertension: Secondary | ICD-10-CM | POA: Diagnosis not present

## 2016-05-05 DIAGNOSIS — K635 Polyp of colon: Secondary | ICD-10-CM | POA: Diagnosis not present

## 2016-05-05 DIAGNOSIS — R69 Illness, unspecified: Secondary | ICD-10-CM | POA: Diagnosis not present

## 2016-05-05 DIAGNOSIS — E119 Type 2 diabetes mellitus without complications: Secondary | ICD-10-CM | POA: Diagnosis not present

## 2016-05-05 LAB — GLUCOSE, CAPILLARY
Glucose-Capillary: 227 mg/dL — ABNORMAL HIGH (ref 65–99)
Glucose-Capillary: 236 mg/dL — ABNORMAL HIGH (ref 65–99)

## 2016-05-05 MED ORDER — SODIUM CHLORIDE 0.9 % IV SOLN: 500.0000 mL | INTRAVENOUS | Status: DC

## 2016-05-05 NOTE — Patient Instructions (Signed)
YOU HAD AN ENDOSCOPIC PROCEDURE TODAY AT THE  ENDOSCOPY CENTER:   Refer to the procedure report that was given to you for any specific questions about what was found during the examination.  If the procedure report does not answer your questions, please call your gastroenterologist to clarify.  If you requested that your care partner not be given the details of your procedure findings, then the procedure report has been included in a sealed envelope for you to review at your convenience later.  YOU SHOULD EXPECT: Some feelings of bloating in the abdomen. Passage of more gas than usual.  Walking can help get rid of the air that was put into your GI tract during the procedure and reduce the bloating. If you had a lower endoscopy (such as a colonoscopy or flexible sigmoidoscopy) you may notice spotting of blood in your stool or on the toilet paper. If you underwent a bowel prep for your procedure, you may not have a normal bowel movement for a few days.  Please Note:  You might notice some irritation and congestion in your nose or some drainage.  This is from the oxygen used during your procedure.  There is no need for concern and it should clear up in a day or so.  SYMPTOMS TO REPORT IMMEDIATELY:   Following lower endoscopy (colonoscopy or flexible sigmoidoscopy):  Excessive amounts of blood in the stool  Significant tenderness or worsening of abdominal pains  Swelling of the abdomen that is new, acute  Fever of 100F or higher   For urgent or emergent issues, a gastroenterologist can be reached at any hour by calling [phone].   DIET:  We do recommend a small meal at first, but then you may proceed to your regular diet.  Drink plenty of fluids but you should avoid alcoholic beverages for 24 hours.  ACTIVITY:  You should plan to take it easy for the rest of today and you should NOT DRIVE or use heavy machinery until tomorrow (because of the sedation medicines used during the test).     FOLLOW UP: Our staff will call the number listed on your records the next business day following your procedure to check on you and address any questions or concerns that you may have regarding the information given to you following your procedure. If we do not reach you, we will leave a message.  However, if you are feeling well and you are not experiencing any problems, there is no need to return our call.  We will assume that you have returned to your regular daily activities without incident.  If any biopsies were taken you will be contacted by phone or by letter within the next 1-3 weeks.  Please call us at [phone] if you have not heard about the biopsies in 3 weeks.    SIGNATURES/CONFIDENTIALITY: You and/or your care partner have signed paperwork which will be entered into your electronic medical record.  These signatures attest to the fact that that the information above on your After Visit Summary has been reviewed and is understood.  Full responsibility of the confidentiality of this discharge information lies with you and/or your care-partner.  Read all of the handouts given to you by your recovery room nurse. 

## 2016-05-05 NOTE — Progress Notes (Signed)
Called to room to assist during endoscopic procedure.  Patient ID and intended procedure confirmed with present staff. Received instructions for my participation in the procedure from the performing physician.  

## 2016-05-05 NOTE — Op Note (Signed)
Ideal Patient Name: Billy [name] Procedure Date: 05/05/2016 3:00 PM MRN: [id number] Endoscopist: Milus Banister , MD Age: 50 Referring MD:  Date of Birth: Dec 19, 1965 Gender: Male Account #: [id number] Procedure:                Colonoscopy Indications:              Abdominal pain in the right lower quadrant (normal                            CT scan, normal labs, pain resolved by the time of                            this procedure) Medicines:                Monitored Anesthesia Care Procedure:                Pre-Anesthesia Assessment:                           - Prior to the procedure, a History and Physical                            was performed, and patient medications and                            allergies were reviewed. The patient's tolerance of                            previous anesthesia was also reviewed. The risks                            and benefits of the procedure and the sedation                            options and risks were discussed with the patient.                            All questions were answered, and informed consent                            was obtained. Prior Anticoagulants: The patient has                            taken no previous anticoagulant or antiplatelet                            agents. ASA Grade Assessment: II - A patient with                            mild systemic disease. After reviewing the risks                            and benefits, the patient was deemed in  satisfactory condition to undergo the procedure.                           After obtaining informed consent, the colonoscope                            was passed under direct vision. Throughout the                            procedure, the patient's blood pressure, pulse, and                            oxygen saturations were monitored continuously. The                            Model PCF-[id number]) scope was  introduced                            through the anus and advanced to the the cecum,                            identified by appendiceal orifice and ileocecal                            valve. The colonoscopy was performed without                            difficulty. The patient tolerated the procedure                            well. The quality of the bowel preparation was                            good. The ileocecal valve, appendiceal orifice, and                            rectum were photographed. Scope In: 3:08:00 PM Scope Out: 3:18:10 PM Scope Withdrawal Time: 0 hours 8 minutes 7 seconds  Total Procedure Duration: 0 hours 10 minutes 10 seconds  Findings:                 A 5 mm polyp was found in the descending colon. The                            polyp was sessile. The polyp was removed with a                            cold snare. Resection and retrieval were complete.                           The exam was otherwise without abnormality on                            direct and retroflexion views. Complications:  No immediate complications. Estimated blood loss:                            None. Estimated Blood Loss:     Estimated blood loss: none. Impression:               - One 5 mm polyp in the descending colon, removed                            with a cold snare. Resected and retrieved.                           - The examination was otherwise normal on direct                            and retroflexion views. Recommendation:           - Patient has a contact number available for                            emergencies. The signs and symptoms of potential                            delayed complications were discussed with the                            patient. Return to normal activities tomorrow.                            Written discharge instructions were provided to the                            patient.                           - Resume previous  diet.                           - Continue present medications.                           You will receive a letter within 2-3 weeks with the                            pathology results and my final recommendations.                           If the polyp(s) is proven to be 'pre-cancerous' on                            pathology, you will need repeat colonoscopy in 5                            years. If the polyp(s) is NOT 'precancerous' on  pathology then you should repeat colon cancer                            screening in 10 years with colonoscopy without need                            for colon cancer screening by any method prior to                            then (including stool testing). Milus Banister, MD 05/05/2016 3:20:34 PM This report has been signed electronically.

## 2016-05-05 NOTE — Progress Notes (Signed)
Report to PACU, RN, vss, BBS= Clear.  

## 2016-05-06 ENCOUNTER — Telehealth: Payer: Self-pay

## 2016-05-06 DIAGNOSIS — R809 Proteinuria, unspecified: Secondary | ICD-10-CM | POA: Diagnosis not present

## 2016-05-06 DIAGNOSIS — Z6833 Body mass index (BMI) 33.0-33.9, adult: Secondary | ICD-10-CM | POA: Diagnosis not present

## 2016-05-06 DIAGNOSIS — I10 Essential (primary) hypertension: Secondary | ICD-10-CM | POA: Diagnosis not present

## 2016-05-06 NOTE — Telephone Encounter (Signed)
Left a message at [phone] for the pt to call us back if any questions or concerns. maw

## 2016-05-07 ENCOUNTER — Ambulatory Visit: Payer: Medicaid Other | Admitting: Gastroenterology

## 2016-05-08 ENCOUNTER — Encounter: Payer: Self-pay | Admitting: Gastroenterology

## 2016-05-09 DIAGNOSIS — R69 Illness, unspecified: Secondary | ICD-10-CM | POA: Diagnosis not present

## 2016-05-11 DIAGNOSIS — H26492 Other secondary cataract, left eye: Secondary | ICD-10-CM | POA: Diagnosis not present

## 2016-05-11 DIAGNOSIS — Z961 Presence of intraocular lens: Secondary | ICD-10-CM | POA: Diagnosis not present

## 2016-05-11 DIAGNOSIS — H18413 Arcus senilis, bilateral: Secondary | ICD-10-CM | POA: Diagnosis not present

## 2016-06-11 DIAGNOSIS — R69 Illness, unspecified: Secondary | ICD-10-CM | POA: Diagnosis not present

## 2016-06-30 DIAGNOSIS — E11311 Type 2 diabetes mellitus with unspecified diabetic retinopathy with macular edema: Secondary | ICD-10-CM | POA: Diagnosis not present

## 2016-06-30 DIAGNOSIS — I10 Essential (primary) hypertension: Secondary | ICD-10-CM | POA: Diagnosis not present

## 2016-06-30 DIAGNOSIS — Z6833 Body mass index (BMI) 33.0-33.9, adult: Secondary | ICD-10-CM | POA: Diagnosis not present

## 2016-06-30 DIAGNOSIS — N183 Chronic kidney disease, stage 3 (moderate): Secondary | ICD-10-CM | POA: Diagnosis not present

## 2016-06-30 DIAGNOSIS — E113599 Type 2 diabetes mellitus with proliferative diabetic retinopathy without macular edema, unspecified eye: Secondary | ICD-10-CM | POA: Diagnosis not present

## 2016-06-30 DIAGNOSIS — E784 Other hyperlipidemia: Secondary | ICD-10-CM | POA: Diagnosis not present

## 2016-06-30 DIAGNOSIS — R69 Illness, unspecified: Secondary | ICD-10-CM | POA: Diagnosis not present

## 2016-06-30 DIAGNOSIS — E11621 Type 2 diabetes mellitus with foot ulcer: Secondary | ICD-10-CM | POA: Diagnosis not present

## 2016-10-20 DIAGNOSIS — R69 Illness, unspecified: Secondary | ICD-10-CM | POA: Diagnosis not present

## 2016-11-03 DIAGNOSIS — E784 Other hyperlipidemia: Secondary | ICD-10-CM | POA: Diagnosis not present

## 2016-11-03 DIAGNOSIS — E11621 Type 2 diabetes mellitus with foot ulcer: Secondary | ICD-10-CM | POA: Diagnosis not present

## 2016-11-03 DIAGNOSIS — R69 Illness, unspecified: Secondary | ICD-10-CM | POA: Diagnosis not present

## 2016-11-03 DIAGNOSIS — I10 Essential (primary) hypertension: Secondary | ICD-10-CM | POA: Diagnosis not present

## 2016-11-03 DIAGNOSIS — N183 Chronic kidney disease, stage 3 (moderate): Secondary | ICD-10-CM | POA: Diagnosis not present

## 2016-11-03 DIAGNOSIS — E11311 Type 2 diabetes mellitus with unspecified diabetic retinopathy with macular edema: Secondary | ICD-10-CM | POA: Diagnosis not present

## 2016-11-03 DIAGNOSIS — E113599 Type 2 diabetes mellitus with proliferative diabetic retinopathy without macular edema, unspecified eye: Secondary | ICD-10-CM | POA: Diagnosis not present

## 2016-11-03 DIAGNOSIS — Z6832 Body mass index (BMI) 32.0-32.9, adult: Secondary | ICD-10-CM | POA: Diagnosis not present

## 2016-11-03 DIAGNOSIS — E11319 Type 2 diabetes mellitus with unspecified diabetic retinopathy without macular edema: Secondary | ICD-10-CM | POA: Diagnosis not present

## 2016-12-22 DIAGNOSIS — R69 Illness, unspecified: Secondary | ICD-10-CM | POA: Diagnosis not present

## 2016-12-29 DIAGNOSIS — H35373 Puckering of macula, bilateral: Secondary | ICD-10-CM | POA: Diagnosis not present

## 2016-12-29 DIAGNOSIS — H3582 Retinal ischemia: Secondary | ICD-10-CM | POA: Diagnosis not present

## 2016-12-29 DIAGNOSIS — E113593 Type 2 diabetes mellitus with proliferative diabetic retinopathy without macular edema, bilateral: Secondary | ICD-10-CM | POA: Diagnosis not present

## 2017-03-02 DIAGNOSIS — E11311 Type 2 diabetes mellitus with unspecified diabetic retinopathy with macular edema: Secondary | ICD-10-CM | POA: Diagnosis not present

## 2017-03-02 DIAGNOSIS — G4709 Other insomnia: Secondary | ICD-10-CM | POA: Diagnosis not present

## 2017-03-02 DIAGNOSIS — N401 Enlarged prostate with lower urinary tract symptoms: Secondary | ICD-10-CM | POA: Insufficient documentation

## 2017-03-02 DIAGNOSIS — I10 Essential (primary) hypertension: Secondary | ICD-10-CM | POA: Diagnosis not present

## 2017-03-02 DIAGNOSIS — E11319 Type 2 diabetes mellitus with unspecified diabetic retinopathy without macular edema: Secondary | ICD-10-CM | POA: Diagnosis not present

## 2017-03-02 DIAGNOSIS — R69 Illness, unspecified: Secondary | ICD-10-CM | POA: Diagnosis not present

## 2017-03-02 DIAGNOSIS — E113599 Type 2 diabetes mellitus with proliferative diabetic retinopathy without macular edema, unspecified eye: Secondary | ICD-10-CM | POA: Diagnosis not present

## 2017-03-02 DIAGNOSIS — E784 Other hyperlipidemia: Secondary | ICD-10-CM | POA: Diagnosis not present

## 2017-03-02 DIAGNOSIS — E1142 Type 2 diabetes mellitus with diabetic polyneuropathy: Secondary | ICD-10-CM | POA: Diagnosis not present

## 2017-03-02 DIAGNOSIS — N183 Chronic kidney disease, stage 3 (moderate): Secondary | ICD-10-CM | POA: Diagnosis not present

## 2017-06-30 ENCOUNTER — Inpatient Hospital Stay (HOSPITAL_COMMUNITY): Payer: Medicare HMO

## 2017-06-30 ENCOUNTER — Encounter (HOSPITAL_COMMUNITY): Payer: Self-pay | Admitting: Internal Medicine

## 2017-06-30 ENCOUNTER — Inpatient Hospital Stay (HOSPITAL_COMMUNITY)
Admission: AD | Admit: 2017-06-30 | Discharge: 2017-07-09 | DRG: 617 | Disposition: A | Discharge status: Home Health | Payer: Medicare HMO | Source: Ambulatory Visit | Attending: Family Medicine | Admitting: Family Medicine

## 2017-06-30 DIAGNOSIS — Z794 Long term (current) use of insulin: Secondary | ICD-10-CM

## 2017-06-30 DIAGNOSIS — I10 Essential (primary) hypertension: Secondary | ICD-10-CM | POA: Diagnosis not present

## 2017-06-30 DIAGNOSIS — Z833 Family history of diabetes mellitus: Secondary | ICD-10-CM

## 2017-06-30 DIAGNOSIS — F419 Anxiety disorder, unspecified: Secondary | ICD-10-CM | POA: Diagnosis present

## 2017-06-30 DIAGNOSIS — E11628 Type 2 diabetes mellitus with other skin complications: Principal | ICD-10-CM

## 2017-06-30 DIAGNOSIS — M86679 Other chronic osteomyelitis, unspecified ankle and foot: Secondary | ICD-10-CM | POA: Diagnosis not present

## 2017-06-30 DIAGNOSIS — M86671 Other chronic osteomyelitis, right ankle and foot: Secondary | ICD-10-CM | POA: Diagnosis not present

## 2017-06-30 DIAGNOSIS — L03115 Cellulitis of right lower limb: Secondary | ICD-10-CM | POA: Diagnosis not present

## 2017-06-30 DIAGNOSIS — Z6834 Body mass index (BMI) 34.0-34.9, adult: Secondary | ICD-10-CM | POA: Diagnosis not present

## 2017-06-30 DIAGNOSIS — E876 Hypokalemia: Secondary | ICD-10-CM | POA: Diagnosis not present

## 2017-06-30 DIAGNOSIS — M546 Pain in thoracic spine: Secondary | ICD-10-CM | POA: Diagnosis not present

## 2017-06-30 DIAGNOSIS — E1142 Type 2 diabetes mellitus with diabetic polyneuropathy: Secondary | ICD-10-CM | POA: Diagnosis present

## 2017-06-30 DIAGNOSIS — F32A Depression, unspecified: Secondary | ICD-10-CM | POA: Diagnosis present

## 2017-06-30 DIAGNOSIS — L03031 Cellulitis of right toe: Secondary | ICD-10-CM | POA: Diagnosis not present

## 2017-06-30 DIAGNOSIS — H547 Unspecified visual loss: Secondary | ICD-10-CM | POA: Diagnosis present

## 2017-06-30 DIAGNOSIS — Q6689 Other  specified congenital deformities of feet: Secondary | ICD-10-CM | POA: Diagnosis not present

## 2017-06-30 DIAGNOSIS — I471 Supraventricular tachycardia: Secondary | ICD-10-CM | POA: Diagnosis not present

## 2017-06-30 DIAGNOSIS — R69 Illness, unspecified: Secondary | ICD-10-CM | POA: Diagnosis not present

## 2017-06-30 DIAGNOSIS — E1169 Type 2 diabetes mellitus with other specified complication: Secondary | ICD-10-CM | POA: Diagnosis present

## 2017-06-30 DIAGNOSIS — E1165 Type 2 diabetes mellitus with hyperglycemia: Secondary | ICD-10-CM | POA: Diagnosis present

## 2017-06-30 DIAGNOSIS — L97509 Non-pressure chronic ulcer of other part of unspecified foot with unspecified severity: Secondary | ICD-10-CM

## 2017-06-30 DIAGNOSIS — E11621 Type 2 diabetes mellitus with foot ulcer: Secondary | ICD-10-CM | POA: Diagnosis not present

## 2017-06-30 DIAGNOSIS — M25519 Pain in unspecified shoulder: Secondary | ICD-10-CM

## 2017-06-30 DIAGNOSIS — M25512 Pain in left shoulder: Secondary | ICD-10-CM | POA: Diagnosis not present

## 2017-06-30 DIAGNOSIS — Z79899 Other long term (current) drug therapy: Secondary | ICD-10-CM

## 2017-06-30 DIAGNOSIS — L089 Local infection of the skin and subcutaneous tissue, unspecified: Secondary | ICD-10-CM | POA: Diagnosis not present

## 2017-06-30 DIAGNOSIS — F329 Major depressive disorder, single episode, unspecified: Secondary | ICD-10-CM | POA: Diagnosis present

## 2017-06-30 DIAGNOSIS — E11319 Type 2 diabetes mellitus with unspecified diabetic retinopathy without macular edema: Secondary | ICD-10-CM | POA: Diagnosis present

## 2017-06-30 DIAGNOSIS — R269 Unspecified abnormalities of gait and mobility: Secondary | ICD-10-CM | POA: Diagnosis not present

## 2017-06-30 DIAGNOSIS — F1220 Cannabis dependence, uncomplicated: Secondary | ICD-10-CM | POA: Diagnosis present

## 2017-06-30 DIAGNOSIS — L0390 Cellulitis, unspecified: Secondary | ICD-10-CM

## 2017-06-30 DIAGNOSIS — M7989 Other specified soft tissue disorders: Secondary | ICD-10-CM | POA: Diagnosis not present

## 2017-06-30 DIAGNOSIS — M86471 Chronic osteomyelitis with draining sinus, right ankle and foot: Secondary | ICD-10-CM | POA: Diagnosis not present

## 2017-06-30 HISTORY — DX: Type 2 diabetes mellitus without complications: E11.9

## 2017-06-30 HISTORY — DX: Depression, unspecified: F32.A

## 2017-06-30 HISTORY — DX: Type 2 diabetes mellitus with unspecified diabetic retinopathy without macular edema: E11.319

## 2017-06-30 HISTORY — DX: Family history of other specified conditions: Z84.89

## 2017-06-30 HISTORY — DX: Anxiety disorder, unspecified: F41.9

## 2017-06-30 HISTORY — DX: Type 2 diabetes mellitus with diabetic neuropathy, unspecified: E11.40

## 2017-06-30 HISTORY — DX: Major depressive disorder, single episode, unspecified: F32.9

## 2017-06-30 HISTORY — DX: Cardiac arrhythmia, unspecified: I49.9

## 2017-06-30 HISTORY — DX: Pneumonia, unspecified organism: J18.9

## 2017-06-30 HISTORY — DX: Non-pressure chronic ulcer of other part of unspecified foot with unspecified severity: L97.509

## 2017-06-30 LAB — BASIC METABOLIC PANEL
Anion gap: 6 (ref 5–15)
BUN: 12 mg/dL (ref 6–20)
CO2: 28 mmol/L (ref 22–32)
Calcium: 8.6 mg/dL — ABNORMAL LOW (ref 8.9–10.3)
Chloride: 105 mmol/L (ref 101–111)
Creatinine, Ser: 0.99 mg/dL (ref 0.61–1.24)
GFR calc Af Amer: >60 mL/min (ref >60)
GFR calc non Af Amer: >60 mL/min (ref >60)
Glucose, Bld: 160 mg/dL — ABNORMAL HIGH (ref 65–99)
Potassium: 4.6 mmol/L (ref 3.5–5.1)
Sodium: 139 mmol/L (ref 135–145)

## 2017-06-30 LAB — CBC WITH DIFFERENTIAL/PLATELET
Basophils Absolute: 0 10*3/uL (ref 0.0–0.1)
Basophils Relative: 1 %
Eosinophils Absolute: 0.2 10*3/uL (ref 0.0–0.7)
Eosinophils Relative: 3 %
HCT: 37.2 % — ABNORMAL LOW (ref 39.0–52.0)
Hemoglobin: 12.5 g/dL — ABNORMAL LOW (ref 13.0–17.0)
Lymphocytes Relative: 31 %
Lymphs Abs: 1.9 10*3/uL (ref 0.7–4.0)
MCH: 31.3 pg (ref 26.0–34.0)
MCHC: 33.6 g/dL (ref 30.0–36.0)
MCV: 93.2 fL (ref 78.0–100.0)
Monocytes Absolute: 0.5 10*3/uL (ref 0.1–1.0)
Monocytes Relative: 9 %
Neutro Abs: 3.5 10*3/uL (ref 1.7–7.7)
Neutrophils Relative %: 56 %
Platelets: 248 10*3/uL (ref 150–400)
RBC: 3.99 MIL/uL — ABNORMAL LOW (ref 4.22–5.81)
RDW: 11.8 % (ref 11.5–15.5)
WBC: 6.2 10*3/uL (ref 4.0–10.5)

## 2017-06-30 LAB — CK: Total CK: 83 U/L (ref 49–397)

## 2017-06-30 LAB — GLUCOSE, CAPILLARY
Glucose-Capillary: 106 mg/dL — ABNORMAL HIGH (ref 65–99)
Glucose-Capillary: 143 mg/dL — ABNORMAL HIGH (ref 65–99)

## 2017-06-30 LAB — LACTIC ACID, PLASMA: Lactic Acid, Venous: 1.2 mmol/L (ref 0.5–1.9)

## 2017-06-30 MED ORDER — PNEUMOCOCCAL VAC POLYVALENT 25 MCG/0.5ML IJ INJ: 0.5000 mL | INJECTION | INTRAMUSCULAR | Status: DC

## 2017-06-30 MED ORDER — HYDROCODONE-ACETAMINOPHEN 5-325 MG PO TABS
1.0000 | ORAL_TABLET | ORAL | Status: DC | PRN
  Administered 2017-07-01 (×2): 1 via ORAL
  Administered 2017-07-02 – 2017-07-03 (×5): 2 via ORAL
  Administered 2017-07-04 – 2017-07-05 (×4): 1 via ORAL
  Administered 2017-07-05 – 2017-07-06 (×2): 2 via ORAL
  Administered 2017-07-06: 1 via ORAL
  Administered 2017-07-06 – 2017-07-09 (×10): 2 via ORAL
  Filled 2017-06-30: qty 1
  Filled 2017-06-30 (×4): qty 2
  Filled 2017-06-30: qty 1
  Filled 2017-06-30 (×11): qty 2
  Filled 2017-06-30 (×4): qty 1
  Filled 2017-06-30: qty 2
  Filled 2017-06-30: qty 1
  Filled 2017-06-30 (×2): qty 2

## 2017-06-30 MED ORDER — ALPRAZOLAM 0.5 MG PO TABS
2.0000 mg | ORAL_TABLET | Freq: Three times a day (TID) | ORAL | Status: DC | PRN
  Administered 2017-06-30 – 2017-07-09 (×16): 2 mg via ORAL
  Filled 2017-06-30 (×16): qty 4

## 2017-06-30 MED ORDER — ZOLPIDEM TARTRATE 5 MG PO TABS
10.0000 mg | ORAL_TABLET | Freq: Every evening | ORAL | Status: DC | PRN
Start: 2017-06-30 — End: 2017-07-09
  Administered 2017-06-30 – 2017-07-09 (×6): 10 mg via ORAL
  Filled 2017-06-30 (×7): qty 2

## 2017-06-30 MED ORDER — INSULIN DETEMIR 100 UNIT/ML FLEXPEN: 30.0000 [IU] | PEN_INJECTOR | Freq: Every day | SUBCUTANEOUS | Status: DC

## 2017-06-30 MED ORDER — CEFTRIAXONE SODIUM 2 G IJ SOLR
2.0000 g | INTRAMUSCULAR | Status: DC
  Administered 2017-06-30 – 2017-07-04 (×5): 2 g via INTRAVENOUS
  Filled 2017-06-30 (×6): qty 2

## 2017-06-30 MED ORDER — ONDANSETRON HCL 4 MG/2ML IJ SOLN
4.0000 mg | Freq: Four times a day (QID) | INTRAMUSCULAR | Status: DC | PRN
Start: 2017-06-30 — End: 2017-07-02
  Filled 2017-06-30: qty 2

## 2017-06-30 MED ORDER — INSULIN ASPART 100 UNIT/ML ~~LOC~~ SOLN
0.0000 [IU] | Freq: Every day | SUBCUTANEOUS | Status: DC
  Administered 2017-07-03: 2 [IU] via SUBCUTANEOUS

## 2017-06-30 MED ORDER — ACETAMINOPHEN 325 MG PO TABS: 650.0000 mg | ORAL_TABLET | Freq: Four times a day (QID) | ORAL | Status: DC | PRN

## 2017-06-30 MED ORDER — VANCOMYCIN HCL 10 G IV SOLR
1250.0000 mg | Freq: Once | INTRAVENOUS | Status: AC
  Administered 2017-06-30: 1250 mg via INTRAVENOUS
  Filled 2017-06-30: qty 1250

## 2017-06-30 MED ORDER — ONDANSETRON HCL 4 MG PO TABS: 4.0000 mg | ORAL_TABLET | Freq: Four times a day (QID) | ORAL | Status: DC | PRN

## 2017-06-30 MED ORDER — GABAPENTIN 100 MG PO CAPS
200.0000 mg | ORAL_CAPSULE | Freq: Two times a day (BID) | ORAL | Status: DC
Start: 2017-06-30 — End: 2017-07-09
  Administered 2017-07-01 – 2017-07-09 (×16): 200 mg via ORAL
  Filled 2017-06-30 (×17): qty 2

## 2017-06-30 MED ORDER — ROSUVASTATIN CALCIUM 20 MG PO TABS
20.0000 mg | ORAL_TABLET | Freq: Every day | ORAL | Status: DC
Start: 2017-07-01 — End: 2017-07-01

## 2017-06-30 MED ORDER — METRONIDAZOLE IN NACL 5-0.79 MG/ML-% IV SOLN
500.0000 mg | Freq: Three times a day (TID) | INTRAVENOUS | Status: DC
  Administered 2017-06-30 – 2017-07-05 (×14): 500 mg via INTRAVENOUS
  Filled 2017-06-30 (×18): qty 100

## 2017-06-30 MED ORDER — VANCOMYCIN HCL 10 G IV SOLR
1250.0000 mg | Freq: Two times a day (BID) | INTRAVENOUS | Status: DC
  Administered 2017-07-01 – 2017-07-05 (×8): 1250 mg via INTRAVENOUS
  Filled 2017-06-30 (×9): qty 1250

## 2017-06-30 MED ORDER — CYCLOBENZAPRINE HCL 10 MG PO TABS
10.0000 mg | ORAL_TABLET | Freq: Three times a day (TID) | ORAL | Status: DC | PRN
  Administered 2017-07-03 – 2017-07-09 (×2): 10 mg via ORAL
  Filled 2017-06-30 (×2): qty 1

## 2017-06-30 MED ORDER — INSULIN ASPART 100 UNIT/ML ~~LOC~~ SOLN
0.0000 [IU] | Freq: Three times a day (TID) | SUBCUTANEOUS | Status: DC
  Administered 2017-07-01: 2 [IU] via SUBCUTANEOUS
  Administered 2017-07-01: 3 [IU] via SUBCUTANEOUS
  Administered 2017-07-01: 2 [IU] via SUBCUTANEOUS
  Administered 2017-07-02 – 2017-07-03 (×3): 3 [IU] via SUBCUTANEOUS
  Administered 2017-07-03: 5 [IU] via SUBCUTANEOUS
  Administered 2017-07-04: 2 [IU] via SUBCUTANEOUS
  Administered 2017-07-04: 3 [IU] via SUBCUTANEOUS
  Administered 2017-07-04: 2 [IU] via SUBCUTANEOUS
  Administered 2017-07-05: 3 [IU] via SUBCUTANEOUS
  Administered 2017-07-05 – 2017-07-06 (×2): 5 [IU] via SUBCUTANEOUS
  Administered 2017-07-06: 3 [IU] via SUBCUTANEOUS
  Administered 2017-07-06: 2 [IU] via SUBCUTANEOUS
  Administered 2017-07-07: 3 [IU] via SUBCUTANEOUS
  Administered 2017-07-07: 5 [IU] via SUBCUTANEOUS
  Administered 2017-07-08: 2 [IU] via SUBCUTANEOUS
  Administered 2017-07-08: 5 [IU] via SUBCUTANEOUS
  Administered 2017-07-08 – 2017-07-09 (×3): 3 [IU] via SUBCUTANEOUS

## 2017-06-30 MED ORDER — MORPHINE SULFATE (PF) 4 MG/ML IV SOLN
2.0000 mg | INTRAVENOUS | Status: DC | PRN
  Administered 2017-06-30 – 2017-07-01 (×5): 2 mg via INTRAVENOUS
  Filled 2017-06-30 (×5): qty 1

## 2017-06-30 MED ORDER — ACETAMINOPHEN 650 MG RE SUPP: 650.0000 mg | Freq: Four times a day (QID) | RECTAL | Status: DC | PRN

## 2017-06-30 MED ORDER — MORPHINE SULFATE (PF) 4 MG/ML IV SOLN
2.0000 mg | Freq: Once | INTRAVENOUS | Status: AC
  Administered 2017-06-30: 2 mg via INTRAVENOUS
  Filled 2017-06-30: qty 1

## 2017-06-30 MED ORDER — ENOXAPARIN SODIUM 40 MG/0.4ML ~~LOC~~ SOLN
40.0000 mg | SUBCUTANEOUS | Status: DC
  Administered 2017-06-30 – 2017-07-08 (×8): 40 mg via SUBCUTANEOUS
  Filled 2017-06-30 (×9): qty 0.4

## 2017-06-30 MED ORDER — RAMIPRIL 10 MG PO CAPS
10.0000 mg | ORAL_CAPSULE | Freq: Every day | ORAL | Status: DC
  Administered 2017-06-30 – 2017-07-08 (×9): 10 mg via ORAL
  Filled 2017-06-30 (×10): qty 1

## 2017-06-30 MED ORDER — DOCUSATE SODIUM 100 MG PO CAPS
100.0000 mg | ORAL_CAPSULE | Freq: Two times a day (BID) | ORAL | Status: DC
  Administered 2017-07-01 (×2): 100 mg via ORAL
  Filled 2017-06-30 (×3): qty 1

## 2017-06-30 MED ORDER — LACTATED RINGERS IV SOLN
INTRAVENOUS | Status: DC
  Administered 2017-06-30: 21:00:00 via INTRAVENOUS

## 2017-06-30 NOTE — Progress Notes (Signed)
Pharmacy Antibiotic Note  Billy [name] is a 51 y.o. male admitted on 06/30/2017 with diabetic foot .  Pharmacy has been consulted for Vancomycin dosing.  Plan: Vancomycin 1250 mg iv Q 12 hours Follow up Scr, cultures, LOT  Height: 6\' 2"  (188 cm) Weight: 264 lb (119.7 kg) IBW/kg (Calculated) : 82.2  Temp (24hrs), Avg:97.8 F (36.6 C), Min:97.6 F (36.4 C), Max:97.9 F (36.6 C)   Recent Labs Lab 06/30/17 1931  WBC 6.2  CREATININE 0.99  LATICACIDVEN 1.2    Estimated Creatinine Clearance: 120 mL/min (by C-G formula based on SCr of 0.99 mg/dL).    No Known Allergies   Thank you for allowing pharmacy to be a part of this patient's care. Anette Guarneri, PharmD [phone] 06/30/2017 9:08 PM

## 2017-06-30 NOTE — Progress Notes (Signed)
Called into room stating pt was shaking. Upon entering room pt was mildly shaking and stated he was also chilled.  VS stable except BP which was 178/101.  Morphine 2mg  IV was given at 1808 and pt stated pain was slightly better.  CBG - 106.  MD notified.  Will continue to monitor.  Eliezer Bottom [place]
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
## 2017-07-01 ENCOUNTER — Inpatient Hospital Stay (HOSPITAL_COMMUNITY): Payer: Medicare HMO

## 2017-07-01 ENCOUNTER — Encounter (HOSPITAL_COMMUNITY): Payer: Medicare HMO

## 2017-07-01 DIAGNOSIS — F1220 Cannabis dependence, uncomplicated: Secondary | ICD-10-CM

## 2017-07-01 DIAGNOSIS — E11628 Type 2 diabetes mellitus with other skin complications: Secondary | ICD-10-CM

## 2017-07-01 DIAGNOSIS — M86671 Other chronic osteomyelitis, right ankle and foot: Secondary | ICD-10-CM

## 2017-07-01 DIAGNOSIS — F419 Anxiety disorder, unspecified: Secondary | ICD-10-CM

## 2017-07-01 DIAGNOSIS — I10 Essential (primary) hypertension: Secondary | ICD-10-CM

## 2017-07-01 DIAGNOSIS — F329 Major depressive disorder, single episode, unspecified: Secondary | ICD-10-CM

## 2017-07-01 DIAGNOSIS — L089 Local infection of the skin and subcutaneous tissue, unspecified: Secondary | ICD-10-CM

## 2017-07-01 DIAGNOSIS — M25512 Pain in left shoulder: Secondary | ICD-10-CM

## 2017-07-01 LAB — RAPID URINE DRUG SCREEN, HOSP PERFORMED
Amphetamines: NOT DETECTED
Barbiturates: NOT DETECTED
Benzodiazepines: POSITIVE — AB
Cocaine: NOT DETECTED
Opiates: POSITIVE — AB
Tetrahydrocannabinol: POSITIVE — AB

## 2017-07-01 LAB — GLUCOSE, CAPILLARY
Glucose-Capillary: 147 mg/dL — ABNORMAL HIGH (ref 65–99)
Glucose-Capillary: 157 mg/dL — ABNORMAL HIGH (ref 65–99)
Glucose-Capillary: 138 mg/dL — ABNORMAL HIGH (ref 65–99)
Glucose-Capillary: 156 mg/dL — ABNORMAL HIGH (ref 65–99)

## 2017-07-01 LAB — BASIC METABOLIC PANEL
Anion gap: 9 (ref 5–15)
BUN: 11 mg/dL (ref 6–20)
CO2: 25 mmol/L (ref 22–32)
Calcium: 8.8 mg/dL — ABNORMAL LOW (ref 8.9–10.3)
Chloride: 107 mmol/L (ref 101–111)
Creatinine, Ser: 0.93 mg/dL (ref 0.61–1.24)
GFR calc Af Amer: >60 mL/min (ref >60)
GFR calc non Af Amer: >60 mL/min (ref >60)
Glucose, Bld: 123 mg/dL — ABNORMAL HIGH (ref 65–99)
Potassium: 4.5 mmol/L (ref 3.5–5.1)
Sodium: 141 mmol/L (ref 135–145)

## 2017-07-01 LAB — CBC
HCT: 36.7 % — ABNORMAL LOW (ref 39.0–52.0)
Hemoglobin: 12.1 g/dL — ABNORMAL LOW (ref 13.0–17.0)
MCH: 30.6 pg (ref 26.0–34.0)
MCHC: 33 g/dL (ref 30.0–36.0)
MCV: 92.9 fL (ref 78.0–100.0)
Platelets: 220 10*3/uL (ref 150–400)
RBC: 3.95 MIL/uL — ABNORMAL LOW (ref 4.22–5.81)
RDW: 11.9 % (ref 11.5–15.5)
WBC: 6.3 10*3/uL (ref 4.0–10.5)

## 2017-07-01 LAB — HEMOGLOBIN A1C
Hgb A1c MFr Bld: 7.8 % — ABNORMAL HIGH (ref 4.8–5.6)
Mean Plasma Glucose: 177.16 mg/dL

## 2017-07-01 LAB — C-REACTIVE PROTEIN: CRP: 4.3 mg/dL — ABNORMAL HIGH (ref <1.0)

## 2017-07-01 LAB — SEDIMENTATION RATE: Sed Rate: 50 mm/hr — ABNORMAL HIGH (ref 0–16)

## 2017-07-01 LAB — PREALBUMIN: Prealbumin: 17.8 mg/dL — ABNORMAL LOW (ref 18–38)

## 2017-07-01 LAB — LACTIC ACID, PLASMA: Lactic Acid, Venous: 0.8 mmol/L (ref 0.5–1.9)

## 2017-07-01 LAB — HIV ANTIBODY (ROUTINE TESTING W REFLEX): HIV Screen 4th Generation wRfx: NONREACTIVE

## 2017-07-01 MED ORDER — IBUPROFEN 400 MG PO TABS
400.0000 mg | ORAL_TABLET | Freq: Three times a day (TID) | ORAL | Status: AC
  Administered 2017-07-01 (×2): 400 mg via ORAL
  Filled 2017-07-01 (×2): qty 1

## 2017-07-01 MED ORDER — JUVEN PO PACK
1.0000 | PACK | Freq: Two times a day (BID) | ORAL | Status: DC
  Administered 2017-07-02 – 2017-07-08 (×13): 1 via ORAL
  Filled 2017-07-01 (×18): qty 1

## 2017-07-01 MED ORDER — PRO-STAT SUGAR FREE PO LIQD
30.0000 mL | Freq: Two times a day (BID) | ORAL | Status: DC
  Administered 2017-07-01 – 2017-07-09 (×15): 30 mL via ORAL
  Filled 2017-07-01 (×16): qty 30

## 2017-07-01 MED ORDER — ADULT MULTIVITAMIN W/MINERALS CH
1.0000 | ORAL_TABLET | Freq: Every day | ORAL | Status: DC
  Administered 2017-07-01 – 2017-07-09 (×7): 1 via ORAL
  Filled 2017-07-01 (×8): qty 1

## 2017-07-01 NOTE — Progress Notes (Signed)
OT Cancellation Note  Patient Details Name: Billy [name] MRN: [id number] DOB: 1965/11/17   Cancelled Treatment:    Reason Eval/Treat Not Completed: Medical issues which prohibited therapy (Pt is on bedrest. )  [name] 07/01/2017, 8:28 AM  07/01/2017 [name], OTR/L Pager: [phone]

## 2017-07-01 NOTE — Progress Notes (Signed)
PT Cancellation Note  Patient Details Name: DENALI BECVAR MRN: [id number] DOB: 07/31/1966   Cancelled Treatment:    Reason Eval/Treat Not Completed: Medical issues which prohibited therapy. Pt on bedrest orders. Will follow-up for PT evaluation when medically appropriate.  Mabeline Caras, PT, DPT Acute Rehab Services  Pager: [place] 07/01/2017, 10:41 AM

## 2017-07-01 NOTE — Progress Notes (Signed)
VASCULAR LAB PRELIMINARY  ARTERIAL  ABI completed: Normal ABI and TBI at rest.     RIGHT    LEFT    PRESSURE WAVEFORM  PRESSURE WAVEFORM  BRACHIAL 170 Triphasic BRACHIAL 175 Triphasic  DP 197 Triphasic DP 201 Triphasic  AT   AT    PT 208 Triphasic PT 213 Triphasic  PER   PER    GREAT TOE 0.81 NA GREAT TOE 0.90 NA    RIGHT LEFT  ABI 1.1 1.2     Billy [name] D, RVT 07/01/2017, 1:36 PM

## 2017-07-01 NOTE — Progress Notes (Signed)
Initial Nutrition Assessment  DOCUMENTATION CODES:   Obesity unspecified  INTERVENTION:   -MVI daily -Juven BID -30 ml Prostat BID, each supplement provides 100 kcals and 15 grams protein  NUTRITION DIAGNOSIS:   Increased nutrient needs related to wound healing as evidenced by estimated needs.  GOAL:   Patient will meet greater than or equal to 90% of their needs  MONITOR:   PO intake, Supplement acceptance, Labs, Weight trends, Skin, I & O's  REASON FOR ASSESSMENT:   Consult Wound healing  ASSESSMENT:   Billy [name] is a 51 y.o. male with medical history significant of diabetes with retinopathy and neuropathy; visual impairment; and depression/anxiety presenting with a right 2nd toe infection.   Pt admitted with DM foot infection; pt with chronic rt claw second toe deformity.   Spoke with pt, who reports improved glycemic control as aresult of lifestyle changes. Pt reports he used to weigh over 300#, but decided to start making diet modifications (decreasing fried and sugary foods, focusing on whole grains, vegetables, and protein) and participating in cycling. Pt reports consuming several small meals per day and consuming mainly water and powerade zero. Meal completion 100%.   Pt shares that HGB A1c typically ranges between 7-8. Last Hgb A1c: 7.8 (06/30/17). Suspect Hgb A1c may be elevated secondary to infection. Per H&P, pt takes 30 units lantus q HS and 5 mg lingagliptin daily. Pt is followed by Dr. Forde Dandy (endocrinology).   Nutrition-Focused physical exam completed. Findings are no fat depletion, no muscle depletion, and mild edema.  Discussed importance of good meal intake (especially protein) and optimal glycemic control to support wound healing. Pt reports he recently met with an RD as an outpatient and does not have any additional DM-related questions at this time.    Labs reviewed: CBGS: 147-157 (inpatient orders for glycmeic control are 0-15 units insulin aspart  TID, 0-5 units insulin aspart q HS, and 30 units insulin glargine q HS).   Diet Order:  Diet Carb Modified Fluid consistency: Thin; Room service appropriate? Yes  Skin:  Wound (see comment) (rt great toe DM ulcer)  Last BM:  06/30/17  Height:   Ht Readings from Last 1 Encounters:  06/30/17 6' 2" (1.88 m)    Weight:   Wt Readings from Last 1 Encounters:  06/30/17 264 lb (119.7 kg)    Ideal Body Weight:  86.4 kg  BMI:  Body mass index is 33.9 kg/m.  Estimated Nutritional Needs:   Kcal:  2300-2500  Protein:  130-145 grams  Fluid:  2.3-2.5 L  EDUCATION NEEDS:   Education needs addressed  Anaily Ashbaugh A. Jimmye Norman, RD, LDN, CDE Pager: [phone] After hours Pager: [phone]
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
## 2017-07-01 NOTE — Progress Notes (Signed)
PT Cancellation Note  Patient Details Name: Billy [name] MRN: [id number] DOB: June 30, 1966   Cancelled Treatment:    Reason Eval/Treat Not Completed: Patient at procedure or test/unavailable - off floor for x-ray. Also, still has active bed rest orders. Will follow-up for PT evaluation when appropriate and as time allows.  Mabeline Caras, PT, DPT Acute Rehab Services  Pager: [place] 07/01/2017, 1:17 PM

## 2017-07-01 NOTE — Consult Note (Signed)
WOC consulted for topical care, reviewed chart.  Orthopedic consultation requested, spoke with bedside nurse. They area awaiting consultation per Dr. Sharol Given this afternoon.  For this reason I will not order topical wound care at this time, will follow up after ortho evaluation for any other wound care needs.   [place], [place], [place]

## 2017-07-02 ENCOUNTER — Encounter (HOSPITAL_COMMUNITY): Payer: Self-pay | Admitting: Orthopedic Surgery

## 2017-07-02 ENCOUNTER — Encounter (HOSPITAL_COMMUNITY)
Admission: AD | Disposition: A | Discharge status: Home Health | Payer: Self-pay | Source: Ambulatory Visit | Attending: Internal Medicine

## 2017-07-02 ENCOUNTER — Inpatient Hospital Stay (HOSPITAL_COMMUNITY): Payer: Medicare HMO | Admitting: Anesthesiology

## 2017-07-02 HISTORY — PX: AMPUTATION: SHX166

## 2017-07-02 LAB — GLUCOSE, CAPILLARY
Glucose-Capillary: 146 mg/dL — ABNORMAL HIGH (ref 65–99)
Glucose-Capillary: 148 mg/dL — ABNORMAL HIGH (ref 65–99)
Glucose-Capillary: 171 mg/dL — ABNORMAL HIGH (ref 65–99)
Glucose-Capillary: 119 mg/dL — ABNORMAL HIGH (ref 65–99)
Glucose-Capillary: 150 mg/dL — ABNORMAL HIGH (ref 65–99)

## 2017-07-02 SURGERY — AMPUTATION, FOOT, RAY
Anesthesia: General | Laterality: Right

## 2017-07-02 MED ORDER — BISACODYL 10 MG RE SUPP: 10.0000 mg | Freq: Every day | RECTAL | Status: DC | PRN

## 2017-07-02 MED ORDER — SODIUM CHLORIDE 0.9 % IV SOLN
INTRAVENOUS | Status: DC
  Administered 2017-07-02: 14:00:00 via INTRAVENOUS

## 2017-07-02 MED ORDER — POVIDONE-IODINE 10 % EX SWAB: 2.0000 "application " | Freq: Once | CUTANEOUS | Status: DC

## 2017-07-02 MED ORDER — ONDANSETRON HCL 4 MG PO TABS: 4.0000 mg | ORAL_TABLET | Freq: Four times a day (QID) | ORAL | Status: DC | PRN

## 2017-07-02 MED ORDER — MIDAZOLAM HCL 2 MG/2ML IJ SOLN
INTRAMUSCULAR | Status: AC
  Filled 2017-07-02: qty 2

## 2017-07-02 MED ORDER — ONDANSETRON HCL 4 MG/2ML IJ SOLN
INTRAMUSCULAR | Status: AC
  Filled 2017-07-02: qty 2

## 2017-07-02 MED ORDER — METHOCARBAMOL 1000 MG/10ML IJ SOLN: 500.0000 mg | Freq: Four times a day (QID) | INTRAMUSCULAR | Status: DC | PRN

## 2017-07-02 MED ORDER — DOCUSATE SODIUM 100 MG PO CAPS
100.0000 mg | ORAL_CAPSULE | Freq: Two times a day (BID) | ORAL | Status: DC
  Administered 2017-07-02 – 2017-07-09 (×13): 100 mg via ORAL
  Filled 2017-07-02 (×14): qty 1

## 2017-07-02 MED ORDER — LACTATED RINGERS IV SOLN
INTRAVENOUS | Status: DC
  Administered 2017-07-02: 09:00:00 via INTRAVENOUS

## 2017-07-02 MED ORDER — ONDANSETRON HCL 4 MG/2ML IJ SOLN
4.0000 mg | Freq: Four times a day (QID) | INTRAMUSCULAR | Status: DC | PRN
  Administered 2017-07-03: 4 mg via INTRAVENOUS
  Filled 2017-07-02: qty 2

## 2017-07-02 MED ORDER — LIDOCAINE 2% (20 MG/ML) 5 ML SYRINGE
INTRAMUSCULAR | Status: DC | PRN
  Administered 2017-07-02: 100 mg via INTRAVENOUS

## 2017-07-02 MED ORDER — FENTANYL CITRATE (PF) 250 MCG/5ML IJ SOLN
INTRAMUSCULAR | Status: AC
  Filled 2017-07-02: qty 5

## 2017-07-02 MED ORDER — METOCLOPRAMIDE HCL 5 MG/ML IJ SOLN: 5.0000 mg | Freq: Three times a day (TID) | INTRAMUSCULAR | Status: DC | PRN

## 2017-07-02 MED ORDER — FENTANYL CITRATE (PF) 100 MCG/2ML IJ SOLN: 25.0000 ug | INTRAMUSCULAR | Status: DC | PRN

## 2017-07-02 MED ORDER — MIDAZOLAM HCL 5 MG/5ML IJ SOLN
INTRAMUSCULAR | Status: DC | PRN
  Administered 2017-07-02: 2 mg via INTRAVENOUS

## 2017-07-02 MED ORDER — CEFAZOLIN SODIUM-DEXTROSE 2-4 GM/100ML-% IV SOLN
2.0000 g | INTRAVENOUS | Status: AC
  Administered 2017-07-02: 2 g via INTRAVENOUS
  Filled 2017-07-02 (×2): qty 100

## 2017-07-02 MED ORDER — MAGNESIUM CITRATE PO SOLN: 1.0000 | Freq: Once | ORAL | Status: DC | PRN

## 2017-07-02 MED ORDER — POLYETHYLENE GLYCOL 3350 17 G PO PACK: 17.0000 g | PACK | Freq: Every day | ORAL | Status: DC | PRN

## 2017-07-02 MED ORDER — LACTATED RINGERS IV SOLN: INTRAVENOUS | Status: DC

## 2017-07-02 MED ORDER — METHOCARBAMOL 500 MG PO TABS
500.0000 mg | ORAL_TABLET | Freq: Four times a day (QID) | ORAL | Status: DC | PRN
  Administered 2017-07-07 (×2): 500 mg via ORAL
  Filled 2017-07-02 (×2): qty 1

## 2017-07-02 MED ORDER — METOCLOPRAMIDE HCL 5 MG/ML IJ SOLN
10.0000 mg | Freq: Once | INTRAMUSCULAR | Status: DC | PRN
Start: 2017-07-02 — End: 2017-07-02

## 2017-07-02 MED ORDER — PROPOFOL 10 MG/ML IV BOLUS
INTRAVENOUS | Status: DC | PRN
  Administered 2017-07-02: 180 mg via INTRAVENOUS

## 2017-07-02 MED ORDER — ONDANSETRON HCL 4 MG/2ML IJ SOLN
INTRAMUSCULAR | Status: DC | PRN
Start: 2017-07-02 — End: 2017-07-02
  Administered 2017-07-02: 4 mg via INTRAVENOUS

## 2017-07-02 MED ORDER — METOCLOPRAMIDE HCL 5 MG PO TABS: 5.0000 mg | ORAL_TABLET | Freq: Three times a day (TID) | ORAL | Status: DC | PRN

## 2017-07-02 MED ORDER — LIDOCAINE 2% (20 MG/ML) 5 ML SYRINGE
INTRAMUSCULAR | Status: AC
  Filled 2017-07-02: qty 5

## 2017-07-02 MED ORDER — ACETAMINOPHEN 325 MG PO TABS: 650.0000 mg | ORAL_TABLET | ORAL | Status: DC | PRN

## 2017-07-02 MED ORDER — ACETAMINOPHEN 650 MG RE SUPP: 650.0000 mg | RECTAL | Status: DC | PRN

## 2017-07-02 MED ORDER — CHLORHEXIDINE GLUCONATE 4 % EX LIQD: 60.0000 mL | Freq: Once | CUTANEOUS | Status: DC

## 2017-07-02 MED ORDER — PROPOFOL 10 MG/ML IV BOLUS
INTRAVENOUS | Status: AC
  Filled 2017-07-02: qty 20

## 2017-07-02 MED ORDER — MEPERIDINE HCL 25 MG/ML IJ SOLN: 6.2500 mg | INTRAMUSCULAR | Status: DC | PRN

## 2017-07-02 MED ORDER — 0.9 % SODIUM CHLORIDE (POUR BTL) OPTIME
TOPICAL | Status: DC | PRN
Start: 2017-07-02 — End: 2017-07-02
  Administered 2017-07-02: 1000 mL

## 2017-07-02 SURGICAL SUPPLY — 29 items
BLADE SAW SGTL MED 73X18.5 STR (BLADE) IMPLANT
BLADE SURG 21 STRL SS (BLADE) ×2 IMPLANT
BNDG COHESIVE 4X5 TAN STRL (GAUZE/BANDAGES/DRESSINGS) ×2 IMPLANT
BNDG GAUZE ELAST 4 BULKY (GAUZE/BANDAGES/DRESSINGS) ×2 IMPLANT
COVER SURGICAL LIGHT HANDLE (MISCELLANEOUS) ×4 IMPLANT
DRAPE U-SHAPE 47X51 STRL (DRAPES) ×4 IMPLANT
DRSG ADAPTIC 3X8 NADH LF (GAUZE/BANDAGES/DRESSINGS) ×2 IMPLANT
DRSG PAD ABDOMINAL 8X10 ST (GAUZE/BANDAGES/DRESSINGS) ×4 IMPLANT
DURAPREP 26ML APPLICATOR (WOUND CARE) ×2 IMPLANT
ELECT REM PT RETURN 9FT ADLT (ELECTROSURGICAL) ×2
ELECTRODE REM PT RTRN 9FT ADLT (ELECTROSURGICAL) ×1 IMPLANT
GAUZE SPONGE 4X4 12PLY STRL (GAUZE/BANDAGES/DRESSINGS) ×2 IMPLANT
GAUZE SPONGE 4X4 12PLY STRL LF (GAUZE/BANDAGES/DRESSINGS) ×1 IMPLANT
GLOVE BIOGEL PI IND STRL 9 (GLOVE) ×1 IMPLANT
GLOVE BIOGEL PI INDICATOR 9 (GLOVE) ×1
GLOVE SURG ORTHO 9.0 STRL STRW (GLOVE) ×2 IMPLANT
GOWN STRL REUS W/ TWL XL LVL3 (GOWN DISPOSABLE) ×2 IMPLANT
GOWN STRL REUS W/TWL XL LVL3 (GOWN DISPOSABLE) ×4
KIT BASIN OR (CUSTOM PROCEDURE TRAY) ×2 IMPLANT
KIT ROOM TURNOVER OR (KITS) ×2 IMPLANT
NS IRRIG 1000ML POUR BTL (IV SOLUTION) ×2 IMPLANT
PACK ORTHO EXTREMITY (CUSTOM PROCEDURE TRAY) ×2 IMPLANT
PAD ABD 8X10 STRL (GAUZE/BANDAGES/DRESSINGS) ×1 IMPLANT
PAD ARMBOARD 7.5X6 YLW CONV (MISCELLANEOUS) ×4 IMPLANT
STOCKINETTE IMPERVIOUS LG (DRAPES) IMPLANT
SUT ETHILON 2 0 PSLX (SUTURE) ×2 IMPLANT
TOWEL OR 17X26 10 PK STRL BLUE (TOWEL DISPOSABLE) ×2 IMPLANT
TUBE CONNECTING 12X1/4 (SUCTIONS) ×2 IMPLANT
YANKAUER SUCT BULB TIP NO VENT (SUCTIONS) ×2 IMPLANT

## 2017-07-02 NOTE — Anesthesia Postprocedure Evaluation (Signed)
Anesthesia Post Note  Patient: Billy [name]  Procedure(s) Performed: RIGHT FOOT SECOND TOE (Right )     Patient location during evaluation: PACU Anesthesia Type: General Level of consciousness: awake and alert Pain management: pain level controlled Vital Signs Assessment: post-procedure vital signs reviewed and stable Respiratory status: spontaneous breathing, nonlabored ventilation, respiratory function stable and patient connected to nasal cannula oxygen Cardiovascular status: blood pressure returned to baseline and stable Postop Assessment: no apparent nausea or vomiting Anesthetic complications: no    Last Vitals:  Vitals:   07/02/17 1100 07/02/17 1107  BP:    Pulse: 63   Resp: 12   Temp:  36.6 C  SpO2: 98%     Last Pain:  Vitals:   07/02/17 1107  TempSrc:   PainSc: Asleep                 Montez Hageman
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
## 2017-07-02 NOTE — Anesthesia Preprocedure Evaluation (Signed)
Anesthesia Evaluation  Patient identified by MRN, date of birth, ID band Patient awake    Reviewed: Allergy & Precautions, NPO status , Patient's Chart, lab work & pertinent test results  Airway Mallampati: II  TM Distance: >3 FB Neck ROM: Full    Dental no notable dental hx.    Pulmonary neg pulmonary ROS,    Pulmonary exam normal breath sounds clear to auscultation       Cardiovascular hypertension, Pt. on medications Normal cardiovascular exam+ dysrhythmias (s/p ablation) Supra Ventricular Tachycardia  Rhythm:Regular Rate:Normal     Neuro/Psych Anxiety Depression negative neurological ROS  negative psych ROS   GI/Hepatic negative GI ROS, (+)     substance abuse  marijuana use,   Endo/Other  diabetes, Type 2, Oral Hypoglycemic Agents  Renal/GU negative Renal ROS  negative genitourinary   Musculoskeletal negative musculoskeletal ROS (+)   Abdominal   Peds negative pediatric ROS (+)  Hematology negative hematology ROS (+)   Anesthesia Other Findings   Reproductive/Obstetrics negative OB ROS                             Anesthesia Physical Anesthesia Plan  ASA: III  Anesthesia Plan: General   Post-op Pain Management:    Induction: Intravenous  PONV Risk Score and Plan: 2 and Ondansetron and Treatment may vary due to age or medical condition  Airway Management Planned: LMA  Additional Equipment:   Intra-op Plan:   Post-operative Plan:   Informed Consent: I have reviewed the patients History and Physical, chart, labs and discussed the procedure including the risks, benefits and alternatives for the proposed anesthesia with the patient or authorized representative who has indicated his/her understanding and acceptance.   Dental advisory given  Plan Discussed with: CRNA  Anesthesia Plan Comments:         Anesthesia Quick Evaluation

## 2017-07-02 NOTE — Progress Notes (Signed)
Orthopedic Tech Progress Note Patient Details:  Billy [name] 07-24-1966 [id number]  Ortho Devices Type of Ortho Device: Postop shoe/boot Ortho Device/Splint Location: rle Ortho Device/Splint Interventions: Application   Billy [name] 07/02/2017, 2:52 PM

## 2017-07-02 NOTE — Op Note (Signed)
06/30/2017 - 07/02/2017  10:20 AM  PATIENT:  [name]    PRE-OPERATIVE DIAGNOSIS:  Cellulitis and osteomyelitis right foot second toe  POST-OPERATIVE DIAGNOSIS:  Same  PROCEDURE:  RIGHT FOOT SECOND TOE  SURGEON:  Newt Minion, MD  PHYSICIAN ASSISTANT:None ANESTHESIA:   General  PREOPERATIVE INDICATIONS:  AHMOD GILLESPIE is a  51 y.o. male with a diagnosis of cellulitis who failed conservative measures and elected for surgical management.    The risks benefits and alternatives were discussed with the patient preoperatively including but not limited to the risks of infection, bleeding, nerve injury, cardiopulmonary complications, the need for revision surgery, among others, and the patient was willing to proceed.  OPERATIVE IMPLANTS: none  OPERATIVE FINDINGS: good petechial bleeding no abscess at the level of amputation  OPERATIVE PROCEDURE: . Patient is brought the operating room and underwent a general anesthetic. After adequate levels anesthesia obtained patient's right lower extremity was prepped using DuraPrep draped into a sterile field a timeout was called. A V incision was made around the second toe right foot. The toe was amputated through the MTP joint. There is no signs of abscess or cellulitis at this level there was good petechial bleeding electrocautery was used for hemostasis. The wound was closed using 2-0 nylon. A sterile dressing was applied patient was extubated taken to the PACU in stable condition.

## 2017-07-02 NOTE — Consult Note (Signed)
WOC follow up thia am, patient for toe amputation vs. Ray amputation.  No further wound care needs.    Re consult if needed, will not follow at this time. Thanks  Seniyah Esker [employer], RN,CWOCN, CNS, [place] [phone])

## 2017-07-02 NOTE — Progress Notes (Signed)
Returned from PACU at this time, denies pain, sister at bedside

## 2017-07-02 NOTE — Care Management Note (Signed)
Case Management Note  Patient Details  Name: Billy [name] MRN: [id number] Date of Birth: 29-Jun-1966  Subjective/Objective:                    Action/Plan: Await PT/OT evals  Expected Discharge Date:                  Expected Discharge Plan:  Home/Self Care  In-House Referral:  Clinical Social Work  Discharge planning Services  CM Consult  Post Acute Care Choice:  Durable Medical Equipment, Home Health Choice offered to:     DME Arranged:    DME Agency:     HH Arranged:    [place] Agency:     Status of Service:  In process, will continue to follow  If discussed at Long Length of Stay Meetings, dates discussed:    Additional Comments:  Marilu Favre, RN 07/02/2017, 2:33 PM

## 2017-07-02 NOTE — Interval H&P Note (Signed)
History and Physical Interval Note:  07/02/2017 9:24 AM  Billy [name]  has presented today for surgery, with the diagnosis of cellulitis  The various methods of treatment have been discussed with the patient and family. After consideration of risks, benefits and other options for treatment, the patient has consented to  Procedure(s): RIGHT FOOT SECOND TOE vs SECOND RAY AMPUTATION (Right) as a surgical intervention .  The patient's history has been reviewed, patient examined, no change in status, stable for surgery.  I have reviewed the patient's chart and labs.  Questions were answered to the patient's satisfaction.     Newt Minion

## 2017-07-02 NOTE — Anesthesia Procedure Notes (Signed)
Procedure Name: LMA Insertion Date/Time: 07/02/2017 9:57 AM Performed by: Melina Copa, Ilissa Rosner R Pre-anesthesia Checklist: Patient identified, Emergency Drugs available, Suction available and Patient being monitored Patient Re-evaluated:Patient Re-evaluated prior to induction Oxygen Delivery Method: Circle System Utilized Preoxygenation: Pre-oxygenation with 100% oxygen Induction Type: IV induction Ventilation: Mask ventilation without difficulty LMA: LMA inserted LMA Size: 5.0 Number of attempts: 1 Placement Confirmation: positive ETCO2 Tube secured with: Tape Dental Injury: Teeth and Oropharynx as per pre-operative assessment

## 2017-07-02 NOTE — Progress Notes (Signed)
OT Cancellation Note  Patient Details Name: Billy [name] MRN: [id number] DOB: [birth year]   Cancelled Treatment:    Reason Eval/Treat Not Completed: Patient at procedure or test/ unavailable. Pt in surgery. Will follow.  [name] 07/02/2017, 8:39 AM  07/02/2017 [name], OTR/L Pager: [phone]

## 2017-07-02 NOTE — Progress Notes (Signed)
PT Cancellation Note  Patient Details Name: Billy [name] MRN: [id number] DOB: [birth year]   Cancelled Treatment:    Reason Eval/Treat Not Completed: Patient at procedure or test/ unavailable. Pt in surgery. Will follow.  Mabeline Caras, PT, DPT Acute Rehab Services  Pager: [place] 07/02/2017, 8:48 AM
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
## 2017-07-02 NOTE — Transfer of Care (Signed)
Immediate Anesthesia Transfer of Care Note  Patient: Billy [name]  Procedure(s) Performed: RIGHT FOOT SECOND TOE (Right )  Patient Location: PACU  Anesthesia Type:General  Level of Consciousness: drowsy and patient cooperative  Airway & Oxygen Therapy: Patient Spontanous Breathing and Patient connected to nasal cannula oxygen  Post-op Assessment: Report given to RN, Post -op Vital signs reviewed and stable and Patient moving all extremities  Post vital signs: Reviewed and stable  Last Vitals:  Vitals:   07/02/17 0700 07/02/17 1026  BP: (!) 147/83   Pulse: 62   Resp: 16   Temp: 36.4 C (!) 36.4 C  SpO2: 99%     Last Pain:  Vitals:   07/02/17 0700  TempSrc: Oral  PainSc:       Patients Stated Pain Goal: 2 (44/31/54 0086)  Complications: No apparent anesthesia complications

## 2017-07-03 ENCOUNTER — Encounter (HOSPITAL_COMMUNITY): Payer: Self-pay | Admitting: Orthopedic Surgery

## 2017-07-03 LAB — CBC
HCT: 35.1 % — ABNORMAL LOW (ref 39.0–52.0)
Hemoglobin: 12 g/dL — ABNORMAL LOW (ref 13.0–17.0)
MCH: 31.2 pg (ref 26.0–34.0)
MCHC: 34.2 g/dL (ref 30.0–36.0)
MCV: 91.2 fL (ref 78.0–100.0)
Platelets: 227 10*3/uL (ref 150–400)
RBC: 3.85 MIL/uL — ABNORMAL LOW (ref 4.22–5.81)
RDW: 11.7 % (ref 11.5–15.5)
WBC: 6.5 10*3/uL (ref 4.0–10.5)

## 2017-07-03 LAB — BASIC METABOLIC PANEL
Anion gap: 7 (ref 5–15)
BUN: 14 mg/dL (ref 6–20)
CO2: 28 mmol/L (ref 22–32)
Calcium: 8.8 mg/dL — ABNORMAL LOW (ref 8.9–10.3)
Chloride: 103 mmol/L (ref 101–111)
Creatinine, Ser: 1.03 mg/dL (ref 0.61–1.24)
GFR calc Af Amer: >60 mL/min (ref >60)
GFR calc non Af Amer: >60 mL/min (ref >60)
Glucose, Bld: 169 mg/dL — ABNORMAL HIGH (ref 65–99)
Potassium: 3.9 mmol/L (ref 3.5–5.1)
Sodium: 138 mmol/L (ref 135–145)

## 2017-07-03 LAB — GLUCOSE, CAPILLARY
Glucose-Capillary: 165 mg/dL — ABNORMAL HIGH (ref 65–99)
Glucose-Capillary: 205 mg/dL — ABNORMAL HIGH (ref 65–99)
Glucose-Capillary: 185 mg/dL — ABNORMAL HIGH (ref 65–99)
Glucose-Capillary: 189 mg/dL — ABNORMAL HIGH (ref 65–99)
Glucose-Capillary: 219 mg/dL — ABNORMAL HIGH (ref 65–99)

## 2017-07-03 MED ORDER — IBUPROFEN 400 MG PO TABS
400.0000 mg | ORAL_TABLET | Freq: Three times a day (TID) | ORAL | Status: AC
  Administered 2017-07-03 – 2017-07-04 (×6): 400 mg via ORAL
  Filled 2017-07-03 (×6): qty 1

## 2017-07-03 MED ORDER — HYDRALAZINE HCL 20 MG/ML IJ SOLN
10.0000 mg | Freq: Once | INTRAMUSCULAR | Status: AC
  Administered 2017-07-03: 10 mg via INTRAVENOUS
  Filled 2017-07-03: qty 1
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
## 2017-07-03 NOTE — Progress Notes (Signed)
Pharmacy Antibiotic Note  Billy [name] is a 51 y.o. male admitted on 06/30/2017 with diabetic foot infection. Now s/p R 2nd toe amputation 10/19 d/t osteo. Pharmacy has been consulted for vancomycin dosing. Renal function stable.  Plan: Vancomycin 1250 mg IV q12h - unable to check trough due to missed dose 10/19 Monitor renal function, clinical progress, and LOT/de-escalation    Height: 6' 2.02" (188 cm) Weight: 264 lb (119.7 kg) IBW/kg (Calculated) : 82.24  Temp (24hrs), Avg:98.1 F (36.7 C), Min:97.7 F (36.5 C), Max:98.5 F (36.9 C)   Recent Labs Lab 06/30/17 1931 06/30/17 2228 07/01/17 0536 07/03/17 0530  WBC 6.2  --  6.3 6.5  CREATININE 0.99  --  0.93 1.03  LATICACIDVEN 1.2 0.8  --   --     Estimated Creatinine Clearance: 115.3 mL/min (by C-G formula based on SCr of 1.03 mg/dL).    No Known Allergies  Antimicrobials this admission: Vanc 10/17>> Ceftriaxone 10/17>> Flagyl 10/17>>  Dose adjustments this admission:   Microbiology results: 10/17 BCx: ngtd  Thank you for allowing pharmacy to be a part of this patient's care.  Renold Genta, PharmD, BCPS Clinical Pharmacist Phone for today - [place] - [phone] 07/03/2017 12:53 PM

## 2017-07-03 NOTE — Evaluation (Signed)
Physical Therapy Evaluation Patient Details Name: Billy [name] MRN: [id number] DOB: 10/17/1965 Today's Date: 07/03/2017   History of Present Illness  Pt is a 51 y.o. male admitted on 06/30/17 with R toe infection; now s/p R 2nd toe amputation on 07/02/17. Pertinent PMH includes DM 2 with peripheral neuropathy, retinopathy, visual impairment, anxiety and depression, chronic right second claw toe deformity.    Clinical Impression  Pt presents with an overall decrease in functional mobility secondary to above. PTA, pt indep and lives at home alone; enjoys cycling for exercise. Educ on RLE TDWB precautions (pt states he has been amb to/from bathroom by placing weight through heel of R foot). Today, pt able to transfer and amb with RW and min guard for balance; limited by decreased activity tolerance and fatigue. Pt would benefit from continued acute PT services to maximize functional mobility and independence prior to d/c with SNF-level therapies; pt in agreement with this.     Follow Up Recommendations SNF;Supervision for mobility/OOB    Equipment Recommendations  Other (comment) (defer to next venue)    Recommendations for Other Services       Precautions / Restrictions Precautions Precautions: Fall Required Braces or Orthoses: Other Brace/Splint Other Brace/Splint: Post-op boot Restrictions Weight Bearing Restrictions: Yes RLE Weight Bearing: Touchdown weight bearing      Mobility  Bed Mobility Overal bed mobility: Needs Assistance Bed Mobility: Supine to Sit     Supine to sit: Supervision;HOB elevated     General bed mobility comments: Supervision for safety and cues to maintain RLE TDWB precautions with bed mobility  Transfers Overall transfer level: Needs assistance Equipment used: Rolling walker (2 wheeled) Transfers: Sit to/from Stand Sit to Stand: Min guard         General transfer comment: Min guard for balance; cues for hand placement and safe technique  with RW.   Ambulation/Gait Ambulation/Gait assistance: Min guard Ambulation Distance (Feet): 80 Feet Assistive device: Rolling walker (2 wheeled)   Gait velocity: Decreased Gait velocity interpretation: <1.8 ft/sec, indicative of risk for recurrent falls General Gait Details: Amb with RW and min guard for balance; good technique with hop-to pattern in order to maintain RLE TDWB precautions. Pt requiring multiple standing rest breaks secondary to BUE and LE fatigue. 1x c/o dizziness which subsided with rest. Pt anxious with mobility  Stairs            Wheelchair Mobility    Modified Rankin (Stroke Patients Only)       Balance Overall balance assessment: Needs assistance Sitting-balance support: No upper extremity supported;Feet supported;Feet unsupported Sitting balance-Leahy Scale: Good     Standing balance support: Bilateral upper extremity supported;During functional activity Standing balance-Leahy Scale: Poor Standing balance comment: Reliant on UE support to maintain RLE precautions                             Pertinent Vitals/Pain Pain Assessment: No/denies pain    Home Living Family/patient expects to be discharged to:: Skilled nursing facility Living Arrangements: Alone Available Help at Discharge: Family;Friend(s);Available PRN/intermittently Type of Home: House Home Access: Stairs to enter   Entrance Stairs-Number of Steps: 1 Home Layout: One level Home Equipment: None      Prior Function Level of Independence: Independent               Hand Dominance        Extremity/Trunk Assessment   Upper Extremity Assessment Upper Extremity Assessment: LUE deficits/detail  LUE Deficits / Details: c/o L shoulder pain; strength WFL    Lower Extremity Assessment Lower Extremity Assessment: RLE deficits/detail RLE Deficits / Details: s/p R toe amputation; hip flexion grossly 4/5       Communication   Communication: No difficulties   Cognition Arousal/Alertness: Awake/alert Behavior During Therapy: WFL for tasks assessed/performed Overall Cognitive Status: Within Functional Limits for tasks assessed                                        General Comments General comments (skin integrity, edema, etc.): Friend present during session    Exercises     Assessment/Plan    PT Assessment Patient needs continued PT services  PT Problem List Decreased strength;Decreased activity tolerance;Decreased balance;Decreased mobility;Decreased knowledge of use of DME;Decreased knowledge of precautions       PT Treatment Interventions DME instruction;Gait training;Stair training;Functional mobility training;Therapeutic activities;Therapeutic exercise;Balance training;Patient/family education    PT Goals (Current goals can be found in the Care Plan section)  Acute Rehab PT Goals Patient Stated Goal: Get stronger at rehab before returning home PT Goal Formulation: With patient Time For Goal Achievement: 07/17/17 Potential to Achieve Goals: Good    Frequency Min 2X/week   Barriers to discharge Decreased caregiver support      Co-evaluation               AM-PAC PT "6 Clicks" Daily Activity  Outcome Measure Difficulty turning over in bed (including adjusting bedclothes, sheets and blankets)?: A Little Difficulty moving from lying on back to sitting on the side of the bed? : A Little Difficulty sitting down on and standing up from a chair with arms (e.g., wheelchair, bedside commode, etc,.)?: A Little Help needed moving to and from a bed to chair (including a wheelchair)?: A Little Help needed walking in hospital room?: A Little Help needed climbing 3-5 steps with a railing? : A Lot 6 Click Score: 17    End of Session Equipment Utilized During Treatment: Gait belt Activity Tolerance: Patient tolerated treatment well Patient left: in chair;with call bell/phone within reach;with family/visitor  present Nurse Communication: Mobility status PT Visit Diagnosis: Other abnormalities of gait and mobility (R26.89)    Time: 1341-1414 PT Time Calculation (min) (ACUTE ONLY): 33 min   Charges:   PT Evaluation $PT Eval Low Complexity: 1 Low PT Treatments $Gait Training: 8-22 mins   PT G Codes:       Mabeline Caras, PT, DPT Acute Rehab Services  Pager: [place] 07/03/2017, 3:05 PM

## 2017-07-03 NOTE — Evaluation (Signed)
Occupational Therapy Evaluation Patient Details Name: Billy [name] MRN: [id number] DOB: February 24, 1966 Today's Date: 07/03/2017    History of Present Illness Pt is a 51 y.o. male admitted on 06/30/17 with R toe infection; now s/p R 2nd toe amputation on 07/02/17. Pertinent PMH includes DM 2 with peripheral neuropathy, retinopathy, visual impairment, anxiety and depression, chronic right second claw toe deformity.   Clinical Impression   PTA Pt independent in ADL and mobility. 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 Pt is currently min A overall for ADL and min guard for mobility with RW and able to maintain TDWB this session with reinforcement of education from previous PT session. Please see OT problem list below. Pt will require skilled OT in the acute setting to maximize safety and independence in ADL as well as SNF level therapy as his home environment is inaccessible and he lives alone. Pt was very pleasant and is willing to work hard to return to PLOF.     Follow Up Recommendations  SNF    Equipment Recommendations  3 in 1 bedside commode    Recommendations for Other Services       Precautions / Restrictions Precautions Precautions: Fall Required Braces or Orthoses: Other Brace/Splint Other Brace/Splint: Post-op boot Restrictions Weight Bearing Restrictions: Yes RLE Weight Bearing: Touchdown weight bearing      Mobility Bed Mobility Overal bed mobility: Needs Assistance Bed Mobility: Supine to Sit;Sit to Supine     Supine to sit: Supervision;HOB elevated Sit to supine: Supervision   General bed mobility comments: Supervision for safety and cues to maintain RLE TDWB precautions with bed mobility  Transfers Overall transfer level: Needs assistance Equipment used: Rolling walker (2 wheeled) Transfers: Sit to/from Stand Sit to Stand: Min guard         General transfer comment: Min guard for balance; cues for hand  placement and safe technique with RW.     Balance Overall balance assessment: Needs assistance Sitting-balance support: No upper extremity supported;Feet supported;Feet unsupported Sitting balance-Leahy Scale: Good     Standing balance support: Bilateral upper extremity supported;During functional activity Standing balance-Leahy Scale: Poor Standing balance comment: Reliant on UE support to maintain RLE precautions                           ADL either performed or assessed with clinical judgement   ADL Overall ADL's : Needs assistance/impaired Eating/Feeding: Independent   Grooming: Wash/dry hands;Wash/dry face;Set up;Sitting Grooming Details (indicate cue type and reason): EOB Upper Body Bathing: Min guard   Lower Body Bathing: Moderate assistance   Upper Body Dressing : Set up   Lower Body Dressing: Moderate assistance;With caregiver independent assisting;Sit to/from stand Lower Body Dressing Details (indicate cue type and reason): mod A to don post-op boot. Pt was dressed when OT entered, reported that he donned shorts himself using lateral lean Toilet Transfer: Min guard;Ambulation;RW;BSC Toilet Transfer Details (indicate cue type and reason): 3 in 1 over toilet to assist with sit to stand, vc for safe hand placement Toileting- Clothing Manipulation and Hygiene: Min guard;Sit to/from stand Toileting - Clothing Manipulation Details (indicate cue type and reason): OT demonstrated and Pt simulated alternating sides to manipulate shorts for sitting down on toilet     Functional mobility during ADLs: Min guard;Rolling walker       Vision Baseline Vision/History:  (low vision at baseline) Patient Visual Report: No change from baseline  Perception     Praxis      Pertinent Vitals/Pain Pain Assessment: 0-10 Pain Score: 6  Pain Location: L shoulder and R foot Pain Descriptors / Indicators: Discomfort;Sore Pain Intervention(s): Monitored during  session;Repositioned;Heat applied (heat for shoulder)     Hand Dominance Right   Extremity/Trunk Assessment Upper Extremity Assessment Upper Extremity Assessment: LUE deficits/detail LUE Deficits / Details: c/o L shoulder pain; strength WFL   Lower Extremity Assessment Lower Extremity Assessment: RLE deficits/detail RLE Deficits / Details: s/p R toe amputation       Communication Communication Communication: No difficulties   Cognition Arousal/Alertness: Awake/alert Behavior During Therapy: WFL for tasks assessed/performed Overall Cognitive Status: Within Functional Limits for tasks assessed                                     General Comments  Family Friend present during session; talked about his collecting, and Pt verbalized wanting to create a safer enviroment for himself by selling some of them    Exercises     Shoulder Instructions      Home Living Family/patient expects to be discharged to:: Skilled nursing facility Living Arrangements: Alone Available Help at Discharge: Family;Friend(s);Available PRN/intermittently Type of Home: House Home Access: Stairs to enter [employer] of Steps: 1   Home Layout: One level               Home Equipment: None   Additional Comments: Pt is a collector/hoarder and room at home is very tight      Prior Functioning/Environment Level of Independence: Independent                 OT Problem List: Decreased strength;Decreased activity tolerance;Impaired balance (sitting and/or standing);Decreased safety awareness;Decreased knowledge of use of DME or AE;Decreased knowledge of precautions;Pain      OT Treatment/Interventions: Self-care/ADL training;Therapeutic exercise;DME and/or AE instruction;Therapeutic activities;Patient/family education;Balance training    OT Goals(Current goals can be found in the care plan section) Acute Rehab OT Goals Patient Stated Goal: Get stronger at rehab  before returning home OT Goal Formulation: With patient Time For Goal Achievement: 07/14/17 Potential to Achieve Goals: Good  OT Frequency: Min 2X/week   Barriers to D/C: Inaccessible home environment  Pt lives alone, and is a self-admitted hoarder       Co-evaluation              AM-PAC PT "6 Clicks" Daily Activity     Outcome Measure Help from another person eating meals?: None Help from another person taking care of personal grooming?: None Help from another person toileting, which includes using toliet, bedpan, or urinal?: A Little Help from another person bathing (including washing, rinsing, drying)?: A Little Help from another person to put on and taking off regular upper body clothing?: None Help from another person to put on and taking off regular lower body clothing?: A Little 6 Click Score: 21   End of Session Equipment Utilized During Treatment: Gait belt;Rolling walker;Other (comment) (post op boot) Nurse Communication: Mobility status;Patient requests pain meds;Weight bearing status  Activity Tolerance: Patient tolerated treatment well (impacted by pain as he had recently walked with PT) Patient left: in bed;with call bell/phone within reach;with family/visitor present  OT Visit Diagnosis: Unsteadiness on feet (R26.81);Other abnormalities of gait and mobility (R26.89);Pain Pain - Right/Left: Right Pain - part of body: Ankle and joints of foot  Time: 1216-2446 OT Time Calculation (min): 27 min Charges:  OT General Charges $OT Visit: 1 Visit OT Evaluation $OT Eval Moderate Complexity: 1 Mod OT Treatments $Self Care/Home Management : 8-22 mins G-Codes:     Hulda Humphrey OTR/L [place] 07/03/2017, 3:34 PM

## 2017-07-04 LAB — GLUCOSE, CAPILLARY
Glucose-Capillary: 147 mg/dL — ABNORMAL HIGH (ref 65–99)
Glucose-Capillary: 142 mg/dL — ABNORMAL HIGH (ref 65–99)
Glucose-Capillary: 173 mg/dL — ABNORMAL HIGH (ref 65–99)
Glucose-Capillary: 190 mg/dL — ABNORMAL HIGH (ref 65–99)

## 2017-07-04 MED ORDER — AMLODIPINE BESYLATE 5 MG PO TABS
5.0000 mg | ORAL_TABLET | Freq: Every day | ORAL | Status: DC
  Administered 2017-07-04 – 2017-07-09 (×6): 5 mg via ORAL
  Filled 2017-07-04 (×6): qty 1

## 2017-07-04 MED ORDER — HYDRALAZINE HCL 20 MG/ML IJ SOLN: 10.0000 mg | Freq: Four times a day (QID) | INTRAMUSCULAR | Status: DC | PRN
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
## 2017-07-05 DIAGNOSIS — M546 Pain in thoracic spine: Secondary | ICD-10-CM

## 2017-07-05 LAB — BASIC METABOLIC PANEL
Anion gap: 8 (ref 5–15)
BUN: 22 mg/dL — ABNORMAL HIGH (ref 6–20)
CO2: 26 mmol/L (ref 22–32)
Calcium: 8.6 mg/dL — ABNORMAL LOW (ref 8.9–10.3)
Chloride: 104 mmol/L (ref 101–111)
Creatinine, Ser: 0.97 mg/dL (ref 0.61–1.24)
GFR calc Af Amer: >60 mL/min (ref >60)
GFR calc non Af Amer: >60 mL/min (ref >60)
Glucose, Bld: 135 mg/dL — ABNORMAL HIGH (ref 65–99)
Potassium: 3.4 mmol/L — ABNORMAL LOW (ref 3.5–5.1)
Sodium: 138 mmol/L (ref 135–145)

## 2017-07-05 LAB — GLUCOSE, CAPILLARY
Glucose-Capillary: 116 mg/dL — ABNORMAL HIGH (ref 65–99)
Glucose-Capillary: 136 mg/dL — ABNORMAL HIGH (ref 65–99)
Glucose-Capillary: 151 mg/dL — ABNORMAL HIGH (ref 65–99)
Glucose-Capillary: 218 mg/dL — ABNORMAL HIGH (ref 65–99)
Glucose-Capillary: 173 mg/dL — ABNORMAL HIGH (ref 65–99)

## 2017-07-05 MED ORDER — POTASSIUM CHLORIDE CRYS ER 20 MEQ PO TBCR
40.0000 meq | EXTENDED_RELEASE_TABLET | Freq: Once | ORAL | Status: AC
  Administered 2017-07-05: 40 meq via ORAL
  Filled 2017-07-05: qty 2

## 2017-07-05 MED ORDER — PREDNISONE 10 MG PO TABS
10.0000 mg | ORAL_TABLET | Freq: Every day | ORAL | Status: DC
  Administered 2017-07-05 – 2017-07-09 (×5): 10 mg via ORAL
  Filled 2017-07-05 (×5): qty 1
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
## 2017-07-05 NOTE — Care Management Important Message (Signed)
Important Message  Patient Details  Name: Billy [name] MRN: [id number] Date of Birth: 07-11-1966   Medicare Important Message Given:  Yes    [name] 07/05/2017, 1:09 PM

## 2017-07-05 NOTE — NC FL2 (Signed)
[place] LEVEL OF CARE SCREENING TOOL     IDENTIFICATION  Patient Name: Billy [name] Birthdate: Jul 31, 1966 Sex: male Admission Date (Current Location): 06/30/2017  [facility] and [place] Number:  Herbalist and Address:  [facility], [place] [address], [place], [place] [address]      Provider Number: [id number]  Attending Physician Name and Address:  [name], MD  Relative Name and Phone Number:       Current Level of Care: Hospital Recommended Level of Care: [place] Prior Approval Number:    Date Approved/Denied:   PASRR Number: Pending; manual review  Discharge Plan: SNF    Current Diagnoses: Patient Active Problem List   Diagnosis Date Noted  . Chronic osteomyelitis of toe, right ([place])   . Diabetic foot infection ([place]) 06/30/2017  . Anxiety and depression 06/30/2017  . Left shoulder pain 06/30/2017  . Marijuana dependence ([place]) 06/30/2017  . Foot ulcer, right ([place]) 06/05/2012    Class: Acute  . Osteomyelitis of toe ([place]) 06/05/2012    Class: Acute  . Diabetes mellitus with ulcer of toe ([place]) 06/05/2012    Class: Acute  . Hypertension 06/05/2012    Class: Chronic    Orientation RESPIRATION BLADDER Height & Weight     Self, Time, Situation, Place  Normal Continent Weight: 264 lb (119.7 kg) Height:  6' 2.02" (188 cm)  BEHAVIORAL SYMPTOMS/MOOD NEUROLOGICAL BOWEL NUTRITION STATUS      Continent Diet (carb modified)  AMBULATORY STATUS COMMUNICATION OF NEEDS Skin   Limited Assist Verbally Surgical wounds (closed incision right leg, 07/02/17; gauze dressing, compression wrap)                       Personal Care Assistance Level of Assistance  Bathing, Dressing Bathing Assistance: Limited assistance   Dressing Assistance: Limited assistance     Functional Limitations Info  Sight Sight Info: Impaired        SPECIAL CARE FACTORS FREQUENCY  PT (By licensed PT), OT (By licensed  OT)     PT Frequency: 5x/wk OT Frequency: 5x/wk            Contractures      Additional Factors Info  Code Status, Allergies, Insulin Sliding Scale Code Status Info: full Allergies Info: NKA   Insulin Sliding Scale Info: 0-15 units 3x daily with meals; 0-5 units daily at bedtime       Current Medications (07/05/2017):  This is the current hospital active medication list Current Facility-Administered Medications  Medication Dose Route Frequency Provider Last Rate Last Dose  . 0.9 %  sodium chloride infusion   Intravenous Continuous Newt Minion, MD 10 mL/hr at 07/02/17 1331    . acetaminophen (TYLENOL) tablet 650 mg  650 mg Oral Q4H PRN Newt Minion, MD       Or  . acetaminophen (TYLENOL) suppository 650 mg  650 mg Rectal Q4H PRN Newt Minion, MD      . ALPRAZolam Duanne Moron) tablet 2 mg  2 mg Oral TID PRN Karmen Bongo, MD   2 mg at 07/05/17 0953  . amLODipine (NORVASC) tablet 5 mg  5 mg Oral Daily [name], MD   5 mg at 07/05/17 0948  . bisacodyl (DULCOLAX) suppository 10 mg  10 mg Rectal Daily PRN Newt Minion, MD      . cefTRIAXone (ROCEPHIN) 2 g in dextrose 5 % 50 mL IVPB  2 g  Intravenous Q24H Karmen Bongo, MD   Stopped at 07/04/17 2157   And  . metroNIDAZOLE (FLAGYL) IVPB 500 mg  500 mg Intravenous Lynne Logan, MD   Stopped at 07/05/17 [phone]  . cyclobenzaprine (FLEXERIL) tablet 10 mg  10 mg Oral TID PRN Karmen Bongo, MD   10 mg at 07/03/17 1844  . docusate sodium (COLACE) capsule 100 mg  100 mg Oral BID Newt Minion, MD   100 mg at 07/05/17 0948  . enoxaparin (LOVENOX) injection 40 mg  40 mg Subcutaneous Q24H Karmen Bongo, MD   40 mg at 07/04/17 2145  . feeding supplement (PRO-STAT SUGAR FREE 64) liquid 30 mL  30 mL Oral BID [name], MD   30 mL at 07/05/17 0948  . gabapentin (NEURONTIN) capsule 200 mg  200 mg Oral BID Karmen Bongo, MD   200 mg at 07/05/17 0948  . hydrALAZINE (APRESOLINE) injection 10 mg  10 mg Intravenous Q6H  PRN Hongalgi, Lenis Dickinson, MD      . HYDROcodone-acetaminophen (NORCO/VICODIN) 5-325 MG per tablet 1-2 tablet  1-2 tablet Oral Q4H PRN Karmen Bongo, MD   1 tablet at 07/05/17 0829  . insulin aspart (novoLOG) injection 0-15 Units  0-15 Units Subcutaneous TID WC Karmen Bongo, MD   3 Units at 07/04/17 1754  . insulin aspart (novoLOG) injection 0-5 Units  0-5 Units Subcutaneous QHS Karmen Bongo, MD   2 Units at 07/03/17 2201  . lactated ringers infusion   Intravenous Continuous Newt Minion, MD 10 mL/hr at 07/02/17 [phone]    . lactated ringers infusion   Intravenous Continuous Montez Hageman, MD      . magnesium citrate solution 1 Bottle  1 Bottle Oral Once PRN Newt Minion, MD      . methocarbamol (ROBAXIN) tablet 500 mg  500 mg Oral Q6H PRN Newt Minion, MD       Or  . methocarbamol (ROBAXIN) 500 mg in dextrose 5 % 50 mL IVPB  500 mg Intravenous Q6H PRN Newt Minion, MD      . metoCLOPramide (REGLAN) tablet 5-10 mg  5-10 mg Oral Q8H PRN Newt Minion, MD       Or  . metoCLOPramide (REGLAN) injection 5-10 mg  5-10 mg Intravenous Q8H PRN Newt Minion, MD      . morphine 4 MG/ML injection 2 mg  2 mg Intravenous Q2H PRN Karmen Bongo, MD   2 mg at 07/01/17 2041  . multivitamin with minerals tablet 1 tablet  1 tablet Oral Daily [name], MD   1 tablet at 07/05/17 0948  . nutrition supplement (JUVEN) (JUVEN) powder packet 1 packet  1 packet Oral BID BM [name], MD   1 packet at 07/05/17 [phone]  . ondansetron (ZOFRAN) tablet 4 mg  4 mg Oral Q6H PRN Newt Minion, MD       Or  . ondansetron [facility]) injection 4 mg  4 mg Intravenous Q6H PRN Newt Minion, MD   4 mg at 07/03/17 0427  . pneumococcal 23 valent vaccine (PNU-IMMUNE) injection 0.5 mL  0.5 mL Intramuscular Tomorrow-1000 Karmen Bongo, MD      . polyethylene glycol (MIRALAX / GLYCOLAX) packet 17 g  17 g Oral Daily PRN Newt Minion, MD      . ramipril (ALTACE) capsule 10 mg  10 mg Oral Ivery Quale, MD    10 mg at 07/04/17 2146  . vancomycin (VANCOCIN) 1,250 mg in  sodium chloride 0.9 % 250 mL IVPB  1,250 mg Intravenous Q12H Karmen Bongo, MD   Stopped at 07/05/17 [phone]  . zolpidem (AMBIEN) tablet 10 mg  10 mg Oral QHS PRN Karmen Bongo, MD   10 mg at 07/03/17 2201     Discharge Medications: Please see discharge summary for a list of discharge medications.  Relevant Imaging Results:  Relevant Lab Results:   Additional Information SS#: [id number]  Geralynn Ochs, LCSW

## 2017-07-05 NOTE — Progress Notes (Signed)
Patient ID: Billy [name], male   DOB: [birth year], 51 y.o.   MRN: [id number] Patient is status post second toe amputation, he states he feels like he needs to go to skilled nursing for rehabilitation.  The dressing is clean and dry.

## 2017-07-05 NOTE — Clinical Social Work Note (Signed)
Clinical Social Work Assessment  Patient Details  Name: Billy [name] MRN: [id number] Date of Birth: 1966/07/17  Date of referral:  07/05/17               Reason for consult:  Facility Placement                Permission sought to share information with:  Facility Sport and exercise psychologist, Family Supports Permission granted to share information::  Yes, Verbal Permission Granted  Name::     Biomedical engineer::  SNF  Relationship::  Sister  Contact Information:     Housing/Transportation Living arrangements for the past 2 months:  Single Family Home Source of Information:  Patient, Other (Comment Required) (Sibling) Patient Interpreter Needed:  None Criminal Activity/Legal Involvement Pertinent to Current Situation/Hospitalization:  No - Comment as needed Significant Relationships:  Siblings Lives with:  Self Do you feel safe going back to the place where you live?  Yes Need for family participation in patient care:  No (Coment)  Care giving concerns:  Patient lives at home alone with an inaccessible living environment, and will benefit from short term rehab to improve ability to independently care for self prior to returning home.   Social Worker assessment / plan:  CSW met with patient and patient's sisters at bedside to discuss recommendation for rehab. CSW determined facility preferences from patient and patient's sisters and answered questions. CSW confirmed with Miquel Dunn that facility had a bed available for the patient and would start insurance authorization. CSW updated family on facility choice having a bed and running insurance. CSW to continue to follow to facilitate discharge once authorization is received. CSW also submitted for manual review PASRR.  Employment status:    Insurance informationEducational psychologist PT Recommendations:  [place] / Referral to community resources:     Patient/Family's Response to care:  Patient agreeable to SNF  placement.  Patient/Family's Understanding of and Emotional Response to Diagnosis, Current Treatment, and Prognosis:  Patient and sisters were curious about the referral and discharge process, as they have never been through this before. Patient's sister acknowledged knowing someone who does admissions at one of the rehab facilities, so she felt lucky that she had someone to help guide her through making the decisions. Patient and sisters appreciative of CSW assistance in coordinating discharge plan.  Emotional Assessment Appearance:  Appears stated age Attitude/Demeanor/Rapport:    Affect (typically observed):  Appropriate Orientation:  Oriented to Self, Oriented to Place, Oriented to  Time, Oriented to Situation Alcohol / Substance use:  Not Applicable Psych involvement (Current and /or in the community):  No (Comment)  Discharge Needs  Concerns to be addressed:  Care Coordination Readmission within the last 30 days:  No Current discharge risk:  Lives alone, Physical Impairment Barriers to Discharge:  Continued Medical Work up, Ship broker, Programmer, applications ([place])   Geralynn Ochs, LCSW 07/05/2017, 4:38 PM

## 2017-07-06 LAB — BASIC METABOLIC PANEL
Anion gap: 5 (ref 5–15)
BUN: 20 mg/dL (ref 6–20)
CO2: 28 mmol/L (ref 22–32)
Calcium: 9 mg/dL (ref 8.9–10.3)
Chloride: 104 mmol/L (ref 101–111)
Creatinine, Ser: 0.92 mg/dL (ref 0.61–1.24)
GFR calc Af Amer: >60 mL/min (ref >60)
GFR calc non Af Amer: >60 mL/min (ref >60)
Glucose, Bld: 148 mg/dL — ABNORMAL HIGH (ref 65–99)
Potassium: 3.5 mmol/L (ref 3.5–5.1)
Sodium: 137 mmol/L (ref 135–145)

## 2017-07-06 LAB — CULTURE, BLOOD (ROUTINE X 2)
Culture: NO GROWTH
Culture: NO GROWTH
Special Requests: ADEQUATE
Special Requests: ADEQUATE

## 2017-07-06 LAB — GLUCOSE, CAPILLARY
Glucose-Capillary: 133 mg/dL — ABNORMAL HIGH (ref 65–99)
Glucose-Capillary: 194 mg/dL — ABNORMAL HIGH (ref 65–99)
Glucose-Capillary: 223 mg/dL — ABNORMAL HIGH (ref 65–99)
Glucose-Capillary: 148 mg/dL — ABNORMAL HIGH (ref 65–99)

## 2017-07-06 MED ORDER — AMLODIPINE BESYLATE 5 MG PO TABS: 5.0000 mg | ORAL_TABLET | Freq: Every day | ORAL | 0 refills | Status: DC

## 2017-07-06 MED ORDER — PREDNISONE 10 MG PO TABS: 10.0000 mg | ORAL_TABLET | Freq: Every day | ORAL | 0 refills | Status: DC

## 2017-07-06 MED ORDER — HYDROCODONE-ACETAMINOPHEN 5-325 MG PO TABS: 1.0000 | ORAL_TABLET | ORAL | 0 refills | Status: DC | PRN

## 2017-07-06 MED ORDER — ALPRAZOLAM 2 MG PO TABS: 2.0000 mg | ORAL_TABLET | Freq: Three times a day (TID) | ORAL | 0 refills | Status: DC | PRN

## 2017-07-06 MED ORDER — METHOCARBAMOL 500 MG PO TABS: 500.0000 mg | ORAL_TABLET | Freq: Four times a day (QID) | ORAL | 0 refills | Status: DC | PRN

## 2017-07-06 NOTE — Progress Notes (Signed)
Occupational Therapy Treatment Patient Details Name: Billy [name] MRN: [id number] DOB: 06/19/1966 Today's Date: 07/06/2017    History of present illness Pt is a 51 y.o. male admitted on 06/30/17 with R toe infection; now s/p R 2nd toe amputation on 07/02/17. Pertinent PMH includes DM 2 with peripheral neuropathy, retinopathy, visual impairment, anxiety and depression, chronic right second claw toe deformity.   OT comments  Pt progressing towards established OT goals. Pt performing full shower (with RLE covered and kept dry) with Min A for LB dressing. Pt requiring VCs to sequence task and increase safety awareness. Will continue to follow acutely. Continue to recommend dc to SNF for further OT.    Follow Up Recommendations  SNF    Equipment Recommendations  3 in 1 bedside commode    Recommendations for Other Services      Precautions / Restrictions Precautions Precautions: Fall Required Braces or Orthoses: Other Brace/Splint Other Brace/Splint: Post-op boot Restrictions Weight Bearing Restrictions: Yes RLE Weight Bearing: Touchdown weight bearing       Mobility Bed Mobility Overal bed mobility: Needs Assistance Bed Mobility: Supine to Sit;Sit to Supine     Supine to sit: Supervision;HOB elevated Sit to supine: Supervision;HOB elevated   General bed mobility comments: Supervision for safety  Transfers Overall transfer level: Needs assistance Equipment used: Rolling walker (2 wheeled) Transfers: Sit to/from Stand Sit to Stand: Min guard         General transfer comment: Cues for RLE TDWB precautions    Balance Overall balance assessment: Needs assistance Sitting-balance support: No upper extremity supported;Feet supported;Feet unsupported Sitting balance-Leahy Scale: Good     Standing balance support: Bilateral upper extremity supported;During functional activity Standing balance-Leahy Scale: Poor Standing balance comment: Reliant on UE support to maintain  RLE precautions                           ADL either performed or assessed with clinical judgement   ADL Overall ADL's : Needs assistance/impaired         Upper Body Bathing: Set up;Sitting Upper Body Bathing Details (indicate cue type and reason): VCs to sit for donning shirt Lower Body Bathing: Minimal assistance;Sit to/from stand Lower Body Bathing Details (indicate cue type and reason): Min A for single LOB whiel donning pants.  Upper Body Dressing : Set up;Sitting   Lower Body Dressing: Sit to/from stand;Minimal assistance Lower Body Dressing Details (indicate cue type and reason): Min A for balance. Pt requiring VCs to sequence task and don RLE first             Functional mobility during ADLs: Min guard;Rolling walker General ADL Comments: Pt performing shower and dressing with supervision and Min A for occasional balance in standing while donning LB clothes. Pt requiring cues to sequence shower task safely. Pt very apprecative to practice shower. Pt fatigued after shower     Vision       Perception     Praxis      Cognition Arousal/Alertness: Awake/alert Behavior During Therapy: WFL for tasks assessed/performed Overall Cognitive Status: Within Functional Limits for tasks assessed                                          Exercises     Shoulder Instructions       General Comments sister present for session  Pertinent Vitals/ Pain       Pain Assessment: Faces Faces Pain Scale: Hurts a little bit Pain Location: L shoulder and R foot Pain Descriptors / Indicators: Discomfort;Sore Pain Intervention(s): Monitored during session;Repositioned  Home Living                                          Prior Functioning/Environment              Frequency  Min 2X/week        Progress Toward Goals  OT Goals(current goals can now be found in the care plan section)  Progress towards OT goals: Progressing  toward goals  Acute Rehab OT Goals Patient Stated Goal: Get stronger at rehab before returning home OT Goal Formulation: With patient Time For Goal Achievement: 07/14/17 Potential to Achieve Goals: Good ADL Goals Pt Will Perform Grooming: with modified independence;standing Pt Will Perform Upper Body Dressing: with modified independence;sitting Pt Will Perform Lower Body Dressing: with modified independence;sit to/from stand Pt Will Transfer to Toilet: with modified independence;bedside commode;ambulating Pt Will Perform Toileting - Clothing Manipulation and hygiene: sit to/from stand;with modified independence Pt Will Perform Tub/Shower Transfer: Tub transfer;ambulating;3 in 1;with min assist  Plan Discharge plan remains appropriate    Co-evaluation                 AM-PAC PT "6 Clicks" Daily Activity     Outcome Measure   Help from another person eating meals?: None Help from another person taking care of personal grooming?: None Help from another person toileting, which includes using toliet, bedpan, or urinal?: A Little Help from another person bathing (including washing, rinsing, drying)?: A Little Help from another person to put on and taking off regular upper body clothing?: None Help from another person to put on and taking off regular lower body clothing?: A Little 6 Click Score: 21    End of Session Equipment Utilized During Treatment: Gait belt;Rolling walker;Other (comment) (post op boot)  OT Visit Diagnosis: Unsteadiness on feet (R26.81);Other abnormalities of gait and mobility (R26.89);Pain Pain - Right/Left: Right Pain - part of body: Ankle and joints of foot   Activity Tolerance Patient tolerated treatment well   Patient Left in bed;with call bell/phone within reach;with family/visitor present   Nurse Communication Mobility status;Patient requests pain meds;Weight bearing status        Time: 1543-1620 OT Time Calculation (min): 37 min  Charges:  OT General Charges $OT Visit: 1 Visit OT Treatments $Self Care/Home Management : 23-37 mins  [place], OTR/L Acute Rehab Pager: [phone] Office: [place] 07/06/2017, 5:14 PM

## 2017-07-06 NOTE — Progress Notes (Signed)
Patient c/o pain at  IV site, looks red and tender. IV discontinued and advised that IV needs to be restarted for IV antibiotics but patient refused.Noted that  IV antibiotics have been discontinued per previous md note.

## 2017-07-06 NOTE — Progress Notes (Signed)
CSW following to facilitate discharge to SNF. CSW still awaiting information from [facility] on insurance approval; patient cannot admit to facility without an insurance authorization.   CSW will follow up tomorrow.  Laveda Abbe, [place] Clinical Social Worker [phone]
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
## 2017-07-06 NOTE — Progress Notes (Signed)
Patient ID: Billy [name], male   DOB: May 08, 1966, 51 y.o.   MRN: [id number] Patient second toe amputation is doing well he has no complaints the dressing is dry.  Patient also complains of some left shoulder pain.    Patient states that about a month ago he fell from 2 rungs up on a ladder onto his left shoulder.  States he had pain in the neck and shoulder.  Patient states that he can do his normal everyday activities without shoulder pain but then has shoulder pain at night and the end of the day.  Patient has full range of motion of his shoulder the left upper extremity is neurovascular intact.  The radiographs show no bony abnormalities.  Anticipate patient may have some subacromial bursitis.  Discussed that we will follow-up in the office.  If he is not better after several months of conservative care then a subacromial injection may be an option followed by a possible MRI scan.  Would give this time to heal on its own.  Recommended against using long-term anti-inflammatories for his shoulder.  Patient states he does have renal disease.

## 2017-07-06 NOTE — Progress Notes (Signed)
Physical Therapy Treatment Patient Details Name: Billy [name] MRN: [id number] DOB: 1966-08-08 Today's Date: 07/06/2017    History of Present Illness Pt is a 51 y.o. male admitted on 06/30/17 with R toe infection; now s/p R 2nd toe amputation on 07/02/17. Pertinent PMH includes DM 2 with peripheral neuropathy, retinopathy, visual impairment, anxiety and depression, chronic right second claw toe deformity.   PT Comments    Pt very motivated to participate with PT today and continued rehab at SNF upon d/c. Improved ambulation distance this session with RW and min guard, continues to require multiple rest breaks with increased distance secondary to fatigue. Demonstrates good awareness of TDWB precautions with RW. Still feel pt would benefit from SNF-level therapies prior to d/c home secondary to increased fall risk and pt having an inaccessible home environment; pt in agreement with this. Will continue to follow acutely.   Follow Up Recommendations  SNF;Supervision for mobility/OOB     Equipment Recommendations  Other (comment) (defer to next venue)    Recommendations for Other Services       Precautions / Restrictions Precautions Precautions: Fall Required Braces or Orthoses: Other Brace/Splint Other Brace/Splint: Post-op boot Restrictions Weight Bearing Restrictions: Yes RLE Weight Bearing: Touchdown weight bearing    Mobility  Bed Mobility Overal bed mobility: Needs Assistance Bed Mobility: Supine to Sit;Sit to Supine     Supine to sit: Supervision;HOB elevated Sit to supine: Supervision;HOB elevated   General bed mobility comments: Supervision for safety  Transfers Overall transfer level: Needs assistance Equipment used: Rolling walker (2 wheeled) Transfers: Sit to/from Stand Sit to Stand: Min guard         General transfer comment: Cues for RLE TDWB precautions  Ambulation/Gait Ambulation/Gait assistance: Min guard Ambulation Distance (Feet): 100  Feet Assistive device: Rolling walker (2 wheeled)   Gait velocity: Decreased Gait velocity interpretation: <1.8 ft/sec, indicative of risk for recurrent falls General Gait Details: Amb 100' with RW and min guard for balance, requiring 3x standing rest breaks secondary to BUE and LE fatigue. Pt with good technique maintaining RLE TDWB precautions.    Stairs            Wheelchair Mobility    Modified Rankin (Stroke Patients Only)       Balance Overall balance assessment: Needs assistance Sitting-balance support: No upper extremity supported;Feet supported;Feet unsupported Sitting balance-Leahy Scale: Good     Standing balance support: Bilateral upper extremity supported;During functional activity Standing balance-Leahy Scale: Poor Standing balance comment: Reliant on UE support to maintain RLE precautions                            Cognition Arousal/Alertness: Awake/alert Behavior During Therapy: WFL for tasks assessed/performed Overall Cognitive Status: Within Functional Limits for tasks assessed                                        Exercises      General Comments        Pertinent Vitals/Pain Pain Assessment: Faces Faces Pain Scale: Hurts a little bit Pain Location: L shoulder and R foot Pain Descriptors / Indicators: Discomfort;Sore Pain Intervention(s): Monitored during session;Repositioned    Home Living                      Prior Function  PT Goals (current goals can now be found in the care plan section) Acute Rehab PT Goals Patient Stated Goal: Get stronger at rehab before returning home PT Goal Formulation: With patient Time For Goal Achievement: 07/17/17 Potential to Achieve Goals: Good Progress towards PT goals: Progressing toward goals    Frequency    Min 2X/week      PT Plan      Co-evaluation              AM-PAC PT "6 Clicks" Daily Activity  Outcome Measure  Difficulty  turning over in bed (including adjusting bedclothes, sheets and blankets)?: A Little Difficulty moving from lying on back to sitting on the side of the bed? : A Little Difficulty sitting down on and standing up from a chair with arms (e.g., wheelchair, bedside commode, etc,.)?: A Little Help needed moving to and from a bed to chair (including a wheelchair)?: A Little Help needed walking in hospital room?: A Little Help needed climbing 3-5 steps with a railing? : A Lot 6 Click Score: 17    End of Session Equipment Utilized During Treatment: Gait belt Activity Tolerance: Patient tolerated treatment well Patient left: in bed;with call bell/phone within reach Nurse Communication: Mobility status PT Visit Diagnosis: Other abnormalities of gait and mobility (R26.89)     Time: 8182-9937 PT Time Calculation (min) (ACUTE ONLY): 17 min  Charges:  $Gait Training: 8-22 mins                    G Codes:      Mabeline Caras, PT, DPT Acute Rehab Services  Pager: [place] 07/06/2017, 10:53 AM

## 2017-07-07 LAB — CREATININE, SERUM
Creatinine, Ser: 1 mg/dL (ref 0.61–1.24)
GFR calc Af Amer: >60 mL/min
GFR calc non Af Amer: >60 mL/min

## 2017-07-07 LAB — GLUCOSE, CAPILLARY
Glucose-Capillary: 120 mg/dL — ABNORMAL HIGH (ref 65–99)
Glucose-Capillary: 179 mg/dL — ABNORMAL HIGH (ref 65–99)
Glucose-Capillary: 241 mg/dL — ABNORMAL HIGH (ref 65–99)
Glucose-Capillary: 159 mg/dL — ABNORMAL HIGH (ref 65–99)

## 2017-07-07 NOTE — Progress Notes (Addendum)
CSW following to facilitate discharge planning. CSW received update from facility that [facility] has denied authorization for SNF placement at this time, feels that patient's needs can be met at a lower level of care. CSW alerted RNCM.  CSW signing off.  Laveda Abbe, [place] Clinical Social Worker [phone]   UPDATE 3:46 PM:  CSW alerted by patient's sister that the patient would like to pursue a peer-to-peer review of the [facility] care. CSW coordinated peer-to-peer with the facility and MD; peer-to-peer is currently pending. CSW will update with information when received.  Laveda Abbe, [place] Clinical Social Worker [phone]

## 2017-07-07 NOTE — Care Management Note (Addendum)
Case Management Note  Patient Details  Name: Billy [name] MRN: [id number] Date of Birth: 1966-05-25  Subjective/Objective:                    Action/Plan:  Patient's sister requesting peer to peer review for SNF. SW Kathlee Nations aware. Paged Dr Verlon Au, awaiting call back. Patient's insurance denied SNF. Explained to patient . Patient aware he is discharged today . Asked if NCM would call his [place] 336 [phone]. Spoke with Billy [name], explained insurance denied SNF. She voiced understanding.  Patient medically ready to be discharged today . Billy [name] said patient cannot be discharged to home due to clutter in his home. Billy [name] stated she planned on him going to SNF short term , that would give her and her sister time to clean his home for him to return.  NCM asked  if he can stay with her or his other sister short term with home health. Billy [name] stated "no". Again explained patient is discharged today  . SNF would be private pay. Billy [name] stated that is not an option. Billy [name] requested a few minutes to "think this over". She has NCM direct number and stated she will call back shortly. Patient aware.  Expected Discharge Date:  07/06/17               Expected Discharge Plan:  [place]  In-House Referral:  Clinical Social Work  Discharge planning Services  CM Consult  Post Acute Care Choice:  Durable Medical Equipment, Home Health Choice offered to:  Patient, Sibling  DME Arranged:    DME Agency:     HH Arranged:    [place] Agency:     Status of Service:  In process, will continue to follow  If discussed at Long Length of Stay Meetings, dates discussed:    Additional Comments:  Billy Favre, RN 07/07/2017, 10:57 AM

## 2017-07-07 NOTE — Progress Notes (Signed)
Patient is stabilized for discharge currently where awaiting disposition as per family and appreciate case manager and social work input

## 2017-07-07 NOTE — Progress Notes (Signed)
Nutrition Follow-up  DOCUMENTATION CODES:   Obesity unspecified  INTERVENTION:   -Continue 30 ml Prostat BID, each supplement provides 100 kcals and 15 grams protein -Continue MVI daily -Contiue Juven BID  NUTRITION DIAGNOSIS:   Increased nutrient needs related to wound healing as evidenced by estimated needs.  Ongoing  GOAL:   Patient will meet greater than or equal to 90% of their needs  Progressing  MONITOR:   PO intake, Supplement acceptance, Labs, Weight trends, Skin, I & O's  REASON FOR ASSESSMENT:   Consult Wound healing  ASSESSMENT:   Billy [name] is a 51 y.o. male with medical history significant of diabetes with retinopathy and neuropathy; visual impairment; and depression/anxiety presenting with a right 2nd toe infection.   S/p Procedure(s) 07/02/17: RIGHT FOOT SECOND TOE AMPUTATION  Pt continues with good appetite. Noted meal completion 75-100%. Per MAR, pt is taking MVI, Prostat, and Juven supplements.   Per CSW notes, pt is awaiting insurance authorization for SNF. He is stable for discharge per MD.   Labs reviewed: CBGS: 120-223 (inpatinet orders for glycemic control are 0-15 units insulin aspart TID with meals and 0-5 units insulin aspart q HS).   Diet Order:  Diet Carb Modified Fluid consistency: Thin; Room service appropriate? Yes Diet - low sodium heart healthy  Skin:  Wound (see comment) (rt second toe amputation)  Last BM:  07/06/17  Height:   Ht Readings from Last 1 Encounters:  07/02/17 6' 2.02" (1.88 m)    Weight:   Wt Readings from Last 1 Encounters:  07/02/17 264 lb (119.7 kg)    Ideal Body Weight:  86.4 kg  BMI:  Body mass index is 33.88 kg/m.  Estimated Nutritional Needs:   Kcal:  2300-2500  Protein:  130-145 grams  Fluid:  2.3-2.5 L  EDUCATION NEEDS:   Education needs addressed  Alessia Gonsalez A. Jimmye Norman, RD, LDN, CDE Pager: [phone] After hours Pager: [phone]

## 2017-07-08 LAB — GLUCOSE, CAPILLARY
Glucose-Capillary: 123 mg/dL — ABNORMAL HIGH (ref 65–99)
Glucose-Capillary: 161 mg/dL — ABNORMAL HIGH (ref 65–99)
Glucose-Capillary: 244 mg/dL — ABNORMAL HIGH (ref 65–99)
Glucose-Capillary: 75 mg/dL (ref 65–99)

## 2017-07-08 NOTE — Progress Notes (Signed)
Awaiting p2p process for now  Patient seen and discussed options with him breifly--might have to d/c home if outcome of appeal is futile  Education officer, museum is aware but disposition hopeful one way or the other for am  Verneita Griffes, MD [facility] [phone]

## 2017-07-08 NOTE — Care Management Important Message (Signed)
Important Message  Patient Details  Name: Billy [name] MRN: [id number] Date of Birth: 1966-04-19   Medicare Important Message Given:  Yes    Nathen May 07/08/2017, 10:46 AM

## 2017-07-08 NOTE — Progress Notes (Addendum)
Discharge Planning: Pt denied for SNF rehab. Please see CSW notes. NCM contacted [employer] # [phone] to follow up/arrange P2P with [facility]. Attending contacted [employer] on 07/07/2017 to complete but has not received a return call. spoke to rep and states the P2P timeframe has expired this am at 9:23 am. States they reached out a 07/07/2017 at 3:44 pm to attending at [phone] and this number did not allow for a message. States next step is an appeal. Contacted CSW Mardene Celeste and provided update. [employer] appeal contact info # [phone] fax [phone]. Jonnie Finner RN CCM Case Mgmt phone [phone]

## 2017-07-08 NOTE — Social Work (Addendum)
CSW called [employer] [facility] phone number [phone] provided by case manager and was advised that patient, provider or SNF should call [phone] for intake appeal line.  CSW called SNF to see their role in process as they initiated auth for SNF placement. CSW was advsied by Olivia Mackie in admission that SNF initiates appeal.  She will keep me posted on outcome.  CSW will f/u on same.  Elissa Hefty, LCSW Clinical Social Worker [phone]

## 2017-07-08 NOTE — Progress Notes (Signed)
Physical Therapy Treatment Patient Details Name: Billy [name] MRN: [id number] DOB: Jul 23, 1966 Today's Date: 07/08/2017    History of Present Illness Pt is a 51 y.o. male admitted on 06/30/17 with R toe infection; now s/p R 2nd toe amputation on 07/02/17. Pertinent PMH includes DM 2 with peripheral neuropathy, retinopathy, visual impairment, anxiety and depression, chronic right second claw toe deformity.   PT Comments    Pt declining OOB mobility this afternoon secondary to having ambulated with mobility tech prior to lunch. Pt had multiple questions regarding soreness in RLE, importance of elevating R foot, standing therex, fall risk reduction, and importance of continued mobility. Pt very receptive and motivated to progress therex as able; demonstrated sitting and standing LE exercises. Will continue to follow acutely.    Follow Up Recommendations  SNF;Supervision for mobility/OOB     Equipment Recommendations  Other (comment) (defer to next venue)    Recommendations for Other Services       Precautions / Restrictions Precautions Precautions: Fall Required Braces or Orthoses: Other Brace/Splint Other Brace/Splint: Post-op boot Restrictions Weight Bearing Restrictions: Yes RLE Weight Bearing: Touchdown weight bearing    Mobility  Bed Mobility Overal bed mobility: Needs Assistance             General bed mobility comments: Supervision to sit up in bed for repositioning; educ on importance of RLE elevation. Pt declining OOB mobility secondary to amb with mobility tech before lunch  Transfers                    Ambulation/Gait                 Stairs            Wheelchair Mobility    Modified Rankin (Stroke Patients Only)       Balance Overall balance assessment: Needs assistance Sitting-balance support: No upper extremity supported;Feet supported;Feet unsupported Sitting balance-Leahy Scale: Good                                      Cognition Arousal/Alertness: Awake/alert Behavior During Therapy: WFL for tasks assessed/performed Overall Cognitive Status: Within Functional Limits for tasks assessed                                        Exercises      General Comments        Pertinent Vitals/Pain Pain Assessment: Faces Faces Pain Scale: Hurts a little bit Pain Location: R foot Pain Descriptors / Indicators: Throbbing Pain Intervention(s): Repositioned    Home Living                      Prior Function            PT Goals (current goals can now be found in the care plan section) Acute Rehab PT Goals Patient Stated Goal: Get stronger at rehab before returning home PT Goal Formulation: With patient Time For Goal Achievement: 07/17/17 Potential to Achieve Goals: Good Progress towards PT goals: Progressing toward goals    Frequency    Min 2X/week      PT Plan Current plan remains appropriate    Co-evaluation              AM-PAC PT "6 Clicks" Daily Activity  Outcome Measure  Difficulty turning over in bed (including adjusting bedclothes, sheets and blankets)?: A Little Difficulty moving from lying on back to sitting on the side of the bed? : A Little Difficulty sitting down on and standing up from a chair with arms (e.g., wheelchair, bedside commode, etc,.)?: A Little Help needed moving to and from a bed to chair (including a wheelchair)?: A Little Help needed walking in hospital room?: A Little Help needed climbing 3-5 steps with a railing? : A Lot 6 Click Score: 17    End of Session Equipment Utilized During Treatment: Gait belt Activity Tolerance: Patient tolerated treatment well Patient left: in bed;with call bell/phone within reach Nurse Communication: Mobility status PT Visit Diagnosis: Other abnormalities of gait and mobility (R26.89)     Time: 3662-9476 PT Time Calculation (min) (ACUTE ONLY): 15 min  Charges:  $Self Care/Home  Management: 8-22                    G Codes:      Mabeline Caras, PT, DPT Acute Rehab Services  Pager: [place] 07/08/2017, 3:25 PM

## 2017-07-08 NOTE — Social Work (Signed)
CSW spoke with sister today and discussed appeal process. CSW advised sister that SNF had to initiate appeal again today. CSW advised sister that if patient denied again, he will have to go home with home health services as he is unable to remain in hospital. Sister voiced agreement and would like to know either way tomorrow.  CSW will continue to follow.  Elissa Hefty, LCSW Clinical Social Worker [phone]

## 2017-07-09 LAB — GLUCOSE, CAPILLARY
Glucose-Capillary: 157 mg/dL — ABNORMAL HIGH (ref 65–99)
Glucose-Capillary: 163 mg/dL — ABNORMAL HIGH (ref 65–99)

## 2017-07-09 NOTE — Progress Notes (Signed)
I had went in patients room to given night time medications.  Pt. Was appeared flushed and was shaking.  Blood sugar checked.  Blood sugar was 75.  Patient reports that blood sugar is normally 110 or above.  He reports that 75 is low for him.  Patient provided with juice, graham crackers and peanut butter, and applesauce.  Pt. Began to feel much better after he had the juice and eat.  Pt. Resting in bed at this time with no complaints.  I will leave sticky note for patient to have bed time snack.

## 2017-07-09 NOTE — Progress Notes (Signed)
Attempted to call report, got a vm; left a message for a nurse to return my call.

## 2017-07-09 NOTE — Care Management Note (Addendum)
Case Management Note  Patient Details  Name: Billy [name] MRN: [id number] Date of Birth: Sep 14, 1966  Subjective/Objective:                    Action/Plan:  NCM and SW called patient's sister Billy [name] . SW gave Ms Billy [name] appeal information as requested by patient.   Ms Billy [name] understands patient will be discharged today. If appeal not approved by insurance today she will take him home to her address with home health . Ms Billy [name] requesting HHRN,PT,OT,aide and SW .   Attending asked NCM to clarify wound care with Dr Billy [name]. Spoke with Dr Billy [name] his instructions are dry dressing daily.   Ms Billy [name] address is [address], [place], [place] [address] phone [phone].  Also spoke with Billy [name] with PT DME recommendations are 3 in 1 and walker    Expected Discharge Date:  07/06/17               Expected Discharge Plan:  [place]  In-House Referral:  Clinical Social Work  Discharge planning Services  CM Consult  Post Acute Care Choice:  Durable Medical Equipment, Home Health Choice offered to:  Patient, Sibling  DME Arranged:  3-N-1, Walker rolling DME Agency:  [place]:  RN, PT, OT, Nurse's Aide [place] Agency:  [place]  Status of Service:  In process, will continue to follow  If discussed at Long Length of Stay Meetings, dates discussed:    Additional Comments:  Billy Favre, RN 07/09/2017, 1:48 PM

## 2017-07-09 NOTE — Progress Notes (Signed)
After speaking with [employer], CSW will submit appeal form for patient. They state they have 14 days to respond. Patient will discharge home with home health with his sister in the meantime.  CSW signing off.  Percell Locus Williette Loewe LCSWA [phone]

## 2017-07-09 NOTE — Progress Notes (Signed)
   Wound clean and well healing to appearance Dry dressings as per Dr. Sharol Given inpu D/c summary updated--home with max HH support  Verneita Griffes, MD [facility] [phone]

## 2017-07-12 DIAGNOSIS — E1151 Type 2 diabetes mellitus with diabetic peripheral angiopathy without gangrene: Secondary | ICD-10-CM | POA: Diagnosis not present

## 2017-07-12 DIAGNOSIS — E1142 Type 2 diabetes mellitus with diabetic polyneuropathy: Secondary | ICD-10-CM | POA: Diagnosis not present

## 2017-07-12 DIAGNOSIS — M25512 Pain in left shoulder: Secondary | ICD-10-CM | POA: Diagnosis not present

## 2017-07-12 DIAGNOSIS — E1169 Type 2 diabetes mellitus with other specified complication: Secondary | ICD-10-CM | POA: Diagnosis not present

## 2017-07-12 DIAGNOSIS — F1210 Cannabis abuse, uncomplicated: Secondary | ICD-10-CM | POA: Diagnosis not present

## 2017-07-12 DIAGNOSIS — R69 Illness, unspecified: Secondary | ICD-10-CM | POA: Diagnosis not present

## 2017-07-12 DIAGNOSIS — M86671 Other chronic osteomyelitis, right ankle and foot: Secondary | ICD-10-CM | POA: Diagnosis not present

## 2017-07-12 DIAGNOSIS — I10 Essential (primary) hypertension: Secondary | ICD-10-CM | POA: Diagnosis not present

## 2017-07-12 DIAGNOSIS — L03115 Cellulitis of right lower limb: Secondary | ICD-10-CM | POA: Diagnosis not present

## 2017-07-12 DIAGNOSIS — Z4781 Encounter for orthopedic aftercare following surgical amputation: Secondary | ICD-10-CM | POA: Diagnosis not present

## 2017-07-13 ENCOUNTER — Ambulatory Visit (INDEPENDENT_AMBULATORY_CARE_PROVIDER_SITE_OTHER): Payer: Medicare HMO | Admitting: Orthopedic Surgery

## 2017-07-13 ENCOUNTER — Encounter (INDEPENDENT_AMBULATORY_CARE_PROVIDER_SITE_OTHER): Payer: Self-pay | Admitting: Orthopedic Surgery

## 2017-07-13 DIAGNOSIS — Z89421 Acquired absence of other right toe(s): Secondary | ICD-10-CM
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
## 2017-07-15 ENCOUNTER — Telehealth (INDEPENDENT_AMBULATORY_CARE_PROVIDER_SITE_OTHER): Payer: Self-pay | Admitting: Radiology

## 2017-07-15 DIAGNOSIS — L03115 Cellulitis of right lower limb: Secondary | ICD-10-CM | POA: Diagnosis not present

## 2017-07-15 DIAGNOSIS — Z4781 Encounter for orthopedic aftercare following surgical amputation: Secondary | ICD-10-CM | POA: Diagnosis not present

## 2017-07-15 DIAGNOSIS — M86671 Other chronic osteomyelitis, right ankle and foot: Secondary | ICD-10-CM | POA: Diagnosis not present

## 2017-07-15 DIAGNOSIS — E1169 Type 2 diabetes mellitus with other specified complication: Secondary | ICD-10-CM | POA: Diagnosis not present

## 2017-07-15 DIAGNOSIS — E1142 Type 2 diabetes mellitus with diabetic polyneuropathy: Secondary | ICD-10-CM | POA: Diagnosis not present

## 2017-07-15 DIAGNOSIS — I10 Essential (primary) hypertension: Secondary | ICD-10-CM | POA: Diagnosis not present

## 2017-07-15 DIAGNOSIS — E1151 Type 2 diabetes mellitus with diabetic peripheral angiopathy without gangrene: Secondary | ICD-10-CM | POA: Diagnosis not present

## 2017-07-15 DIAGNOSIS — R69 Illness, unspecified: Secondary | ICD-10-CM | POA: Diagnosis not present

## 2017-07-15 DIAGNOSIS — M25512 Pain in left shoulder: Secondary | ICD-10-CM | POA: Diagnosis not present

## 2017-07-15 NOTE — Telephone Encounter (Signed)
I called and spoke with [facility] RN, to advise of wound care, dial soap cleansing and dry dressing if compression stockings with nano silver and copper have not been obtained. Advised ok to take motrin for shoulder pain.

## 2017-07-16 ENCOUNTER — Telehealth (INDEPENDENT_AMBULATORY_CARE_PROVIDER_SITE_OTHER): Payer: Self-pay

## 2017-07-16 DIAGNOSIS — I10 Essential (primary) hypertension: Secondary | ICD-10-CM | POA: Diagnosis not present

## 2017-07-16 DIAGNOSIS — M25512 Pain in left shoulder: Secondary | ICD-10-CM | POA: Diagnosis not present

## 2017-07-16 DIAGNOSIS — Z4781 Encounter for orthopedic aftercare following surgical amputation: Secondary | ICD-10-CM | POA: Diagnosis not present

## 2017-07-16 DIAGNOSIS — L03115 Cellulitis of right lower limb: Secondary | ICD-10-CM | POA: Diagnosis not present

## 2017-07-16 DIAGNOSIS — M86671 Other chronic osteomyelitis, right ankle and foot: Secondary | ICD-10-CM | POA: Diagnosis not present

## 2017-07-16 DIAGNOSIS — E1151 Type 2 diabetes mellitus with diabetic peripheral angiopathy without gangrene: Secondary | ICD-10-CM | POA: Diagnosis not present

## 2017-07-16 DIAGNOSIS — R69 Illness, unspecified: Secondary | ICD-10-CM | POA: Diagnosis not present

## 2017-07-16 DIAGNOSIS — E1169 Type 2 diabetes mellitus with other specified complication: Secondary | ICD-10-CM | POA: Diagnosis not present

## 2017-07-16 DIAGNOSIS — E1142 Type 2 diabetes mellitus with diabetic polyneuropathy: Secondary | ICD-10-CM | POA: Diagnosis not present

## 2017-07-16 NOTE — Telephone Encounter (Signed)
Called to advise that he will be seeing the pt 2 x q wk for 3 weeks for falls risk reduction and also noted that the pt will remain NWTB on right foot for an additional 2 weeks.

## 2017-07-20 DIAGNOSIS — L03115 Cellulitis of right lower limb: Secondary | ICD-10-CM | POA: Diagnosis not present

## 2017-07-20 DIAGNOSIS — Z4781 Encounter for orthopedic aftercare following surgical amputation: Secondary | ICD-10-CM | POA: Diagnosis not present

## 2017-07-20 DIAGNOSIS — R69 Illness, unspecified: Secondary | ICD-10-CM | POA: Diagnosis not present

## 2017-07-20 DIAGNOSIS — E1151 Type 2 diabetes mellitus with diabetic peripheral angiopathy without gangrene: Secondary | ICD-10-CM | POA: Diagnosis not present

## 2017-07-20 DIAGNOSIS — E1169 Type 2 diabetes mellitus with other specified complication: Secondary | ICD-10-CM | POA: Diagnosis not present

## 2017-07-20 DIAGNOSIS — M86671 Other chronic osteomyelitis, right ankle and foot: Secondary | ICD-10-CM | POA: Diagnosis not present

## 2017-07-20 DIAGNOSIS — I10 Essential (primary) hypertension: Secondary | ICD-10-CM | POA: Diagnosis not present

## 2017-07-20 DIAGNOSIS — E1142 Type 2 diabetes mellitus with diabetic polyneuropathy: Secondary | ICD-10-CM | POA: Diagnosis not present

## 2017-07-20 DIAGNOSIS — M25512 Pain in left shoulder: Secondary | ICD-10-CM | POA: Diagnosis not present

## 2017-07-21 ENCOUNTER — Telehealth (INDEPENDENT_AMBULATORY_CARE_PROVIDER_SITE_OTHER): Payer: Self-pay | Admitting: Radiology

## 2017-07-21 DIAGNOSIS — Z4781 Encounter for orthopedic aftercare following surgical amputation: Secondary | ICD-10-CM | POA: Diagnosis not present

## 2017-07-21 DIAGNOSIS — M25512 Pain in left shoulder: Secondary | ICD-10-CM | POA: Diagnosis not present

## 2017-07-21 DIAGNOSIS — E1151 Type 2 diabetes mellitus with diabetic peripheral angiopathy without gangrene: Secondary | ICD-10-CM | POA: Diagnosis not present

## 2017-07-21 DIAGNOSIS — M86671 Other chronic osteomyelitis, right ankle and foot: Secondary | ICD-10-CM | POA: Diagnosis not present

## 2017-07-21 DIAGNOSIS — E1142 Type 2 diabetes mellitus with diabetic polyneuropathy: Secondary | ICD-10-CM | POA: Diagnosis not present

## 2017-07-21 DIAGNOSIS — R69 Illness, unspecified: Secondary | ICD-10-CM | POA: Diagnosis not present

## 2017-07-21 DIAGNOSIS — E1169 Type 2 diabetes mellitus with other specified complication: Secondary | ICD-10-CM | POA: Diagnosis not present

## 2017-07-21 DIAGNOSIS — I10 Essential (primary) hypertension: Secondary | ICD-10-CM | POA: Diagnosis not present

## 2017-07-21 DIAGNOSIS — L03115 Cellulitis of right lower limb: Secondary | ICD-10-CM | POA: Diagnosis not present

## 2017-07-21 NOTE — Telephone Encounter (Signed)
[name] is calling to let our office know that Mr. Billy [name] has had 2 falls in the last 5 days in his home.  He states that there has no injury to the patient.

## 2017-07-22 DIAGNOSIS — E1169 Type 2 diabetes mellitus with other specified complication: Secondary | ICD-10-CM | POA: Diagnosis not present

## 2017-07-22 DIAGNOSIS — I10 Essential (primary) hypertension: Secondary | ICD-10-CM | POA: Diagnosis not present

## 2017-07-22 DIAGNOSIS — E1142 Type 2 diabetes mellitus with diabetic polyneuropathy: Secondary | ICD-10-CM | POA: Diagnosis not present

## 2017-07-22 DIAGNOSIS — M86671 Other chronic osteomyelitis, right ankle and foot: Secondary | ICD-10-CM | POA: Diagnosis not present

## 2017-07-22 DIAGNOSIS — M25512 Pain in left shoulder: Secondary | ICD-10-CM | POA: Diagnosis not present

## 2017-07-22 DIAGNOSIS — E1151 Type 2 diabetes mellitus with diabetic peripheral angiopathy without gangrene: Secondary | ICD-10-CM | POA: Diagnosis not present

## 2017-07-22 DIAGNOSIS — L03115 Cellulitis of right lower limb: Secondary | ICD-10-CM | POA: Diagnosis not present

## 2017-07-22 DIAGNOSIS — R69 Illness, unspecified: Secondary | ICD-10-CM | POA: Diagnosis not present

## 2017-07-22 DIAGNOSIS — Z4781 Encounter for orthopedic aftercare following surgical amputation: Secondary | ICD-10-CM | POA: Diagnosis not present

## 2017-07-22 NOTE — Telephone Encounter (Signed)
Noted. Pt has an appt on 07/28/17

## 2017-07-23 DIAGNOSIS — E1169 Type 2 diabetes mellitus with other specified complication: Secondary | ICD-10-CM | POA: Diagnosis not present

## 2017-07-23 DIAGNOSIS — L03115 Cellulitis of right lower limb: Secondary | ICD-10-CM | POA: Diagnosis not present

## 2017-07-23 DIAGNOSIS — Z4781 Encounter for orthopedic aftercare following surgical amputation: Secondary | ICD-10-CM | POA: Diagnosis not present

## 2017-07-23 DIAGNOSIS — E1142 Type 2 diabetes mellitus with diabetic polyneuropathy: Secondary | ICD-10-CM | POA: Diagnosis not present

## 2017-07-23 DIAGNOSIS — Z6827 Body mass index (BMI) 27.0-27.9, adult: Secondary | ICD-10-CM | POA: Diagnosis not present

## 2017-07-23 DIAGNOSIS — I10 Essential (primary) hypertension: Secondary | ICD-10-CM | POA: Diagnosis not present

## 2017-07-23 DIAGNOSIS — E1151 Type 2 diabetes mellitus with diabetic peripheral angiopathy without gangrene: Secondary | ICD-10-CM | POA: Diagnosis not present

## 2017-07-23 DIAGNOSIS — Z89429 Acquired absence of other toe(s), unspecified side: Secondary | ICD-10-CM | POA: Insufficient documentation

## 2017-07-23 DIAGNOSIS — N183 Chronic kidney disease, stage 3 (moderate): Secondary | ICD-10-CM | POA: Diagnosis not present

## 2017-07-23 DIAGNOSIS — M86671 Other chronic osteomyelitis, right ankle and foot: Secondary | ICD-10-CM | POA: Diagnosis not present

## 2017-07-23 DIAGNOSIS — M25512 Pain in left shoulder: Secondary | ICD-10-CM | POA: Diagnosis not present

## 2017-07-23 DIAGNOSIS — E113599 Type 2 diabetes mellitus with proliferative diabetic retinopathy without macular edema, unspecified eye: Secondary | ICD-10-CM | POA: Diagnosis not present

## 2017-07-23 DIAGNOSIS — E11319 Type 2 diabetes mellitus with unspecified diabetic retinopathy without macular edema: Secondary | ICD-10-CM | POA: Diagnosis not present

## 2017-07-23 DIAGNOSIS — R69 Illness, unspecified: Secondary | ICD-10-CM | POA: Diagnosis not present

## 2017-07-23 DIAGNOSIS — E7849 Other hyperlipidemia: Secondary | ICD-10-CM | POA: Diagnosis not present

## 2017-07-26 DIAGNOSIS — Z4781 Encounter for orthopedic aftercare following surgical amputation: Secondary | ICD-10-CM | POA: Diagnosis not present

## 2017-07-26 DIAGNOSIS — M25512 Pain in left shoulder: Secondary | ICD-10-CM | POA: Diagnosis not present

## 2017-07-26 DIAGNOSIS — E1169 Type 2 diabetes mellitus with other specified complication: Secondary | ICD-10-CM | POA: Diagnosis not present

## 2017-07-26 DIAGNOSIS — M86671 Other chronic osteomyelitis, right ankle and foot: Secondary | ICD-10-CM | POA: Diagnosis not present

## 2017-07-26 DIAGNOSIS — R69 Illness, unspecified: Secondary | ICD-10-CM | POA: Diagnosis not present

## 2017-07-26 DIAGNOSIS — L03115 Cellulitis of right lower limb: Secondary | ICD-10-CM | POA: Diagnosis not present

## 2017-07-26 DIAGNOSIS — E1151 Type 2 diabetes mellitus with diabetic peripheral angiopathy without gangrene: Secondary | ICD-10-CM | POA: Diagnosis not present

## 2017-07-26 DIAGNOSIS — E1142 Type 2 diabetes mellitus with diabetic polyneuropathy: Secondary | ICD-10-CM | POA: Diagnosis not present

## 2017-07-26 DIAGNOSIS — I10 Essential (primary) hypertension: Secondary | ICD-10-CM | POA: Diagnosis not present

## 2017-07-27 ENCOUNTER — Telehealth (INDEPENDENT_AMBULATORY_CARE_PROVIDER_SITE_OTHER): Payer: Self-pay | Admitting: Orthopedic Surgery

## 2017-07-27 DIAGNOSIS — Z4781 Encounter for orthopedic aftercare following surgical amputation: Secondary | ICD-10-CM | POA: Diagnosis not present

## 2017-07-27 DIAGNOSIS — E1151 Type 2 diabetes mellitus with diabetic peripheral angiopathy without gangrene: Secondary | ICD-10-CM | POA: Diagnosis not present

## 2017-07-27 DIAGNOSIS — M86671 Other chronic osteomyelitis, right ankle and foot: Secondary | ICD-10-CM | POA: Diagnosis not present

## 2017-07-27 DIAGNOSIS — R69 Illness, unspecified: Secondary | ICD-10-CM | POA: Diagnosis not present

## 2017-07-27 DIAGNOSIS — I10 Essential (primary) hypertension: Secondary | ICD-10-CM | POA: Diagnosis not present

## 2017-07-27 DIAGNOSIS — L03115 Cellulitis of right lower limb: Secondary | ICD-10-CM | POA: Diagnosis not present

## 2017-07-27 DIAGNOSIS — E1142 Type 2 diabetes mellitus with diabetic polyneuropathy: Secondary | ICD-10-CM | POA: Diagnosis not present

## 2017-07-27 DIAGNOSIS — E1169 Type 2 diabetes mellitus with other specified complication: Secondary | ICD-10-CM | POA: Diagnosis not present

## 2017-07-27 DIAGNOSIS — M25512 Pain in left shoulder: Secondary | ICD-10-CM | POA: Diagnosis not present

## 2017-07-27 NOTE — Telephone Encounter (Signed)
Catherine (OT) with [facility] called needing verbal orders for 1 time a week for this week. The number to contact [facility] is [phone]

## 2017-07-27 NOTE — Telephone Encounter (Signed)
I called and spoke with Billy [name] to give verbal order for home health over the phone.

## 2017-07-28 ENCOUNTER — Ambulatory Visit (INDEPENDENT_AMBULATORY_CARE_PROVIDER_SITE_OTHER): Payer: Medicare HMO | Admitting: Orthopedic Surgery

## 2017-07-28 ENCOUNTER — Encounter (INDEPENDENT_AMBULATORY_CARE_PROVIDER_SITE_OTHER): Payer: Self-pay | Admitting: Family

## 2017-07-28 ENCOUNTER — Ambulatory Visit (INDEPENDENT_AMBULATORY_CARE_PROVIDER_SITE_OTHER): Payer: Medicare HMO | Admitting: Family

## 2017-07-28 DIAGNOSIS — I10 Essential (primary) hypertension: Secondary | ICD-10-CM | POA: Diagnosis not present

## 2017-07-28 DIAGNOSIS — M25512 Pain in left shoulder: Secondary | ICD-10-CM | POA: Diagnosis not present

## 2017-07-28 DIAGNOSIS — E1142 Type 2 diabetes mellitus with diabetic polyneuropathy: Secondary | ICD-10-CM | POA: Diagnosis not present

## 2017-07-28 DIAGNOSIS — E1151 Type 2 diabetes mellitus with diabetic peripheral angiopathy without gangrene: Secondary | ICD-10-CM | POA: Diagnosis not present

## 2017-07-28 DIAGNOSIS — R69 Illness, unspecified: Secondary | ICD-10-CM | POA: Diagnosis not present

## 2017-07-28 DIAGNOSIS — Z89421 Acquired absence of other right toe(s): Secondary | ICD-10-CM

## 2017-07-28 DIAGNOSIS — E1169 Type 2 diabetes mellitus with other specified complication: Secondary | ICD-10-CM | POA: Diagnosis not present

## 2017-07-28 DIAGNOSIS — L03115 Cellulitis of right lower limb: Secondary | ICD-10-CM | POA: Diagnosis not present

## 2017-07-28 DIAGNOSIS — M86671 Other chronic osteomyelitis, right ankle and foot: Secondary | ICD-10-CM | POA: Diagnosis not present

## 2017-07-28 DIAGNOSIS — Z4781 Encounter for orthopedic aftercare following surgical amputation: Secondary | ICD-10-CM | POA: Diagnosis not present
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
## 2017-07-29 ENCOUNTER — Other Ambulatory Visit (INDEPENDENT_AMBULATORY_CARE_PROVIDER_SITE_OTHER): Payer: Self-pay

## 2017-07-29 ENCOUNTER — Telehealth (INDEPENDENT_AMBULATORY_CARE_PROVIDER_SITE_OTHER): Payer: Self-pay | Admitting: *Deleted

## 2017-07-29 DIAGNOSIS — R69 Illness, unspecified: Secondary | ICD-10-CM | POA: Diagnosis not present

## 2017-07-29 DIAGNOSIS — M86671 Other chronic osteomyelitis, right ankle and foot: Secondary | ICD-10-CM | POA: Diagnosis not present

## 2017-07-29 DIAGNOSIS — I10 Essential (primary) hypertension: Secondary | ICD-10-CM | POA: Diagnosis not present

## 2017-07-29 DIAGNOSIS — E1151 Type 2 diabetes mellitus with diabetic peripheral angiopathy without gangrene: Secondary | ICD-10-CM | POA: Diagnosis not present

## 2017-07-29 DIAGNOSIS — E1142 Type 2 diabetes mellitus with diabetic polyneuropathy: Secondary | ICD-10-CM | POA: Diagnosis not present

## 2017-07-29 DIAGNOSIS — E1169 Type 2 diabetes mellitus with other specified complication: Secondary | ICD-10-CM | POA: Diagnosis not present

## 2017-07-29 DIAGNOSIS — M79661 Pain in right lower leg: Secondary | ICD-10-CM

## 2017-07-29 DIAGNOSIS — M25512 Pain in left shoulder: Secondary | ICD-10-CM | POA: Diagnosis not present

## 2017-07-29 DIAGNOSIS — L03115 Cellulitis of right lower limb: Secondary | ICD-10-CM | POA: Diagnosis not present

## 2017-07-29 DIAGNOSIS — Z4781 Encounter for orthopedic aftercare following surgical amputation: Secondary | ICD-10-CM | POA: Diagnosis not present

## 2017-07-29 NOTE — Telephone Encounter (Signed)
I called pt and advised that an order has been placed for a doppler to r/o a blood clot. Advised either today or tomorrow first available. Sending to you and also order in chart. Please let me know if there is anything that I can do. They are awaiting your phone call. Thanks

## 2017-07-29 NOTE — Telephone Encounter (Signed)
Called stating when doing therapy pt had significant increase in R calf, no significant circumference size or heat, is tender to palpitate, when doing nothing but sitting pain is 0/10 when engage in stretching has mild pain to 6-7/10. Concern for DVT. Please advise.

## 2017-07-29 NOTE — Telephone Encounter (Signed)
I have tried to contact pt no answer, will try again at a later time.

## 2017-07-30 ENCOUNTER — Ambulatory Visit (HOSPITAL_COMMUNITY)
Admission: RE | Admit: 2017-07-30 | Discharge: 2017-07-30 | Disposition: A | Discharge status: Home / Self Care | Payer: Medicare HMO | Source: Ambulatory Visit | Attending: Family | Admitting: Family

## 2017-07-30 DIAGNOSIS — M79661 Pain in right lower leg: Secondary | ICD-10-CM | POA: Diagnosis not present

## 2017-07-30 NOTE — Telephone Encounter (Signed)
Pt has appt today with [facility] at 10am with arrival of 945am, pt is aware of appt.

## 2017-07-30 NOTE — Progress Notes (Signed)
Right lower extremity venous duplex completed. No evidence of DVT, superficial thrombosis, or Baker's cyst.  Toma Copier , RVS 07/29/2017 11:13

## 2017-08-02 DIAGNOSIS — M86671 Other chronic osteomyelitis, right ankle and foot: Secondary | ICD-10-CM | POA: Diagnosis not present

## 2017-08-02 DIAGNOSIS — R69 Illness, unspecified: Secondary | ICD-10-CM | POA: Diagnosis not present

## 2017-08-02 DIAGNOSIS — L03115 Cellulitis of right lower limb: Secondary | ICD-10-CM | POA: Diagnosis not present

## 2017-08-02 DIAGNOSIS — Z4781 Encounter for orthopedic aftercare following surgical amputation: Secondary | ICD-10-CM | POA: Diagnosis not present

## 2017-08-02 DIAGNOSIS — E1142 Type 2 diabetes mellitus with diabetic polyneuropathy: Secondary | ICD-10-CM | POA: Diagnosis not present

## 2017-08-02 DIAGNOSIS — M25512 Pain in left shoulder: Secondary | ICD-10-CM | POA: Diagnosis not present

## 2017-08-02 DIAGNOSIS — E1169 Type 2 diabetes mellitus with other specified complication: Secondary | ICD-10-CM | POA: Diagnosis not present

## 2017-08-02 DIAGNOSIS — I10 Essential (primary) hypertension: Secondary | ICD-10-CM | POA: Diagnosis not present

## 2017-08-02 DIAGNOSIS — E1151 Type 2 diabetes mellitus with diabetic peripheral angiopathy without gangrene: Secondary | ICD-10-CM | POA: Diagnosis not present

## 2017-08-06 DIAGNOSIS — Z4781 Encounter for orthopedic aftercare following surgical amputation: Secondary | ICD-10-CM | POA: Diagnosis not present

## 2017-08-06 DIAGNOSIS — I10 Essential (primary) hypertension: Secondary | ICD-10-CM | POA: Diagnosis not present

## 2017-08-06 DIAGNOSIS — E1142 Type 2 diabetes mellitus with diabetic polyneuropathy: Secondary | ICD-10-CM | POA: Diagnosis not present

## 2017-08-06 DIAGNOSIS — L03115 Cellulitis of right lower limb: Secondary | ICD-10-CM | POA: Diagnosis not present

## 2017-08-06 DIAGNOSIS — M25512 Pain in left shoulder: Secondary | ICD-10-CM | POA: Diagnosis not present

## 2017-08-06 DIAGNOSIS — R69 Illness, unspecified: Secondary | ICD-10-CM | POA: Diagnosis not present

## 2017-08-06 DIAGNOSIS — E1169 Type 2 diabetes mellitus with other specified complication: Secondary | ICD-10-CM | POA: Diagnosis not present

## 2017-08-06 DIAGNOSIS — M86671 Other chronic osteomyelitis, right ankle and foot: Secondary | ICD-10-CM | POA: Diagnosis not present

## 2017-08-06 DIAGNOSIS — E1151 Type 2 diabetes mellitus with diabetic peripheral angiopathy without gangrene: Secondary | ICD-10-CM | POA: Diagnosis not present

## 2017-08-09 ENCOUNTER — Telehealth (INDEPENDENT_AMBULATORY_CARE_PROVIDER_SITE_OTHER): Payer: Self-pay | Admitting: Radiology

## 2017-08-09 NOTE — Telephone Encounter (Signed)
[name] called, said patient is going home and is out of PT visits.  Wants approval for one time home assessment visit, approval given.

## 2017-08-11 ENCOUNTER — Encounter (INDEPENDENT_AMBULATORY_CARE_PROVIDER_SITE_OTHER): Payer: Self-pay | Admitting: Family

## 2017-08-11 ENCOUNTER — Ambulatory Visit (INDEPENDENT_AMBULATORY_CARE_PROVIDER_SITE_OTHER): Payer: Medicare HMO | Admitting: Family

## 2017-08-11 DIAGNOSIS — Z89421 Acquired absence of other right toe(s): Secondary | ICD-10-CM
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
## 2017-08-12 DIAGNOSIS — Z89429 Acquired absence of other toe(s), unspecified side: Secondary | ICD-10-CM | POA: Diagnosis not present

## 2017-08-12 DIAGNOSIS — N401 Enlarged prostate with lower urinary tract symptoms: Secondary | ICD-10-CM | POA: Diagnosis not present

## 2017-08-12 DIAGNOSIS — E1142 Type 2 diabetes mellitus with diabetic polyneuropathy: Secondary | ICD-10-CM | POA: Diagnosis not present

## 2017-08-12 DIAGNOSIS — E113599 Type 2 diabetes mellitus with proliferative diabetic retinopathy without macular edema, unspecified eye: Secondary | ICD-10-CM | POA: Diagnosis not present

## 2017-08-12 DIAGNOSIS — Z794 Long term (current) use of insulin: Secondary | ICD-10-CM | POA: Insufficient documentation

## 2017-08-12 DIAGNOSIS — N08 Glomerular disorders in diseases classified elsewhere: Secondary | ICD-10-CM | POA: Diagnosis not present

## 2017-08-12 DIAGNOSIS — E1139 Type 2 diabetes mellitus with other diabetic ophthalmic complication: Secondary | ICD-10-CM | POA: Diagnosis not present

## 2017-08-12 DIAGNOSIS — Z6829 Body mass index (BMI) 29.0-29.9, adult: Secondary | ICD-10-CM | POA: Diagnosis not present

## 2017-08-12 DIAGNOSIS — E7849 Other hyperlipidemia: Secondary | ICD-10-CM | POA: Diagnosis not present

## 2017-08-16 DIAGNOSIS — E1142 Type 2 diabetes mellitus with diabetic polyneuropathy: Secondary | ICD-10-CM | POA: Diagnosis not present

## 2017-08-16 DIAGNOSIS — Z4781 Encounter for orthopedic aftercare following surgical amputation: Secondary | ICD-10-CM | POA: Diagnosis not present

## 2017-08-16 DIAGNOSIS — I10 Essential (primary) hypertension: Secondary | ICD-10-CM | POA: Diagnosis not present

## 2017-08-16 DIAGNOSIS — R69 Illness, unspecified: Secondary | ICD-10-CM | POA: Diagnosis not present

## 2017-08-16 DIAGNOSIS — M86671 Other chronic osteomyelitis, right ankle and foot: Secondary | ICD-10-CM | POA: Diagnosis not present

## 2017-08-16 DIAGNOSIS — E1151 Type 2 diabetes mellitus with diabetic peripheral angiopathy without gangrene: Secondary | ICD-10-CM | POA: Diagnosis not present

## 2017-08-16 DIAGNOSIS — L03115 Cellulitis of right lower limb: Secondary | ICD-10-CM | POA: Diagnosis not present

## 2017-08-16 DIAGNOSIS — E1169 Type 2 diabetes mellitus with other specified complication: Secondary | ICD-10-CM | POA: Diagnosis not present

## 2017-08-16 DIAGNOSIS — M25512 Pain in left shoulder: Secondary | ICD-10-CM | POA: Diagnosis not present

## 2017-09-24 DIAGNOSIS — E113512 Type 2 diabetes mellitus with proliferative diabetic retinopathy with macular edema, left eye: Secondary | ICD-10-CM | POA: Diagnosis not present

## 2017-09-24 DIAGNOSIS — H3582 Retinal ischemia: Secondary | ICD-10-CM | POA: Diagnosis not present

## 2017-09-24 DIAGNOSIS — E113591 Type 2 diabetes mellitus with proliferative diabetic retinopathy without macular edema, right eye: Secondary | ICD-10-CM | POA: Diagnosis not present

## 2017-09-24 DIAGNOSIS — H35373 Puckering of macula, bilateral: Secondary | ICD-10-CM | POA: Diagnosis not present

## 2018-05-09 DIAGNOSIS — E1142 Type 2 diabetes mellitus with diabetic polyneuropathy: Secondary | ICD-10-CM | POA: Diagnosis not present

## 2018-05-09 DIAGNOSIS — I10 Essential (primary) hypertension: Secondary | ICD-10-CM | POA: Diagnosis not present

## 2018-05-09 DIAGNOSIS — Z89429 Acquired absence of other toe(s), unspecified side: Secondary | ICD-10-CM | POA: Diagnosis not present

## 2018-05-09 DIAGNOSIS — E113599 Type 2 diabetes mellitus with proliferative diabetic retinopathy without macular edema, unspecified eye: Secondary | ICD-10-CM | POA: Diagnosis not present

## 2018-05-09 DIAGNOSIS — E1139 Type 2 diabetes mellitus with other diabetic ophthalmic complication: Secondary | ICD-10-CM | POA: Diagnosis not present

## 2018-05-09 DIAGNOSIS — R635 Abnormal weight gain: Secondary | ICD-10-CM | POA: Diagnosis not present

## 2018-05-09 DIAGNOSIS — N08 Glomerular disorders in diseases classified elsewhere: Secondary | ICD-10-CM | POA: Diagnosis not present

## 2018-05-09 DIAGNOSIS — N183 Chronic kidney disease, stage 3 (moderate): Secondary | ICD-10-CM | POA: Diagnosis not present

## 2018-05-09 DIAGNOSIS — E7849 Other hyperlipidemia: Secondary | ICD-10-CM | POA: Diagnosis not present

## 2018-05-09 DIAGNOSIS — N401 Enlarged prostate with lower urinary tract symptoms: Secondary | ICD-10-CM | POA: Diagnosis not present

## 2018-05-12 DIAGNOSIS — E11319 Type 2 diabetes mellitus with unspecified diabetic retinopathy without macular edema: Secondary | ICD-10-CM | POA: Diagnosis not present

## 2018-05-12 DIAGNOSIS — I10 Essential (primary) hypertension: Secondary | ICD-10-CM | POA: Diagnosis not present

## 2018-05-12 DIAGNOSIS — Z6832 Body mass index (BMI) 32.0-32.9, adult: Secondary | ICD-10-CM | POA: Diagnosis not present

## 2018-05-12 DIAGNOSIS — N183 Chronic kidney disease, stage 3 (moderate): Secondary | ICD-10-CM | POA: Diagnosis not present

## 2018-05-18 DIAGNOSIS — H3582 Retinal ischemia: Secondary | ICD-10-CM | POA: Diagnosis not present

## 2018-05-18 DIAGNOSIS — H35373 Puckering of macula, bilateral: Secondary | ICD-10-CM | POA: Diagnosis not present

## 2018-05-18 DIAGNOSIS — E113591 Type 2 diabetes mellitus with proliferative diabetic retinopathy without macular edema, right eye: Secondary | ICD-10-CM | POA: Diagnosis not present

## 2018-05-18 DIAGNOSIS — E113512 Type 2 diabetes mellitus with proliferative diabetic retinopathy with macular edema, left eye: Secondary | ICD-10-CM | POA: Diagnosis not present

## 2018-05-26 DIAGNOSIS — R69 Illness, unspecified: Secondary | ICD-10-CM | POA: Diagnosis not present

## 2019-02-01 DIAGNOSIS — E11319 Type 2 diabetes mellitus with unspecified diabetic retinopathy without macular edema: Secondary | ICD-10-CM | POA: Diagnosis not present

## 2019-02-01 DIAGNOSIS — E7849 Other hyperlipidemia: Secondary | ICD-10-CM | POA: Diagnosis not present

## 2019-02-01 DIAGNOSIS — Z125 Encounter for screening for malignant neoplasm of prostate: Secondary | ICD-10-CM | POA: Diagnosis not present

## 2019-02-01 DIAGNOSIS — I10 Essential (primary) hypertension: Secondary | ICD-10-CM | POA: Diagnosis not present

## 2019-02-01 DIAGNOSIS — N183 Chronic kidney disease, stage 3 (moderate): Secondary | ICD-10-CM | POA: Diagnosis not present

## 2019-02-03 DIAGNOSIS — E113593 Type 2 diabetes mellitus with proliferative diabetic retinopathy without macular edema, bilateral: Secondary | ICD-10-CM | POA: Diagnosis not present

## 2019-02-03 DIAGNOSIS — Z89429 Acquired absence of other toe(s), unspecified side: Secondary | ICD-10-CM | POA: Diagnosis not present

## 2019-02-03 DIAGNOSIS — R69 Illness, unspecified: Secondary | ICD-10-CM | POA: Diagnosis not present

## 2019-02-03 DIAGNOSIS — E11319 Type 2 diabetes mellitus with unspecified diabetic retinopathy without macular edema: Secondary | ICD-10-CM | POA: Diagnosis not present

## 2019-02-03 DIAGNOSIS — E669 Obesity, unspecified: Secondary | ICD-10-CM | POA: Insufficient documentation

## 2019-02-03 DIAGNOSIS — Z0000 Encounter for general adult medical examination without abnormal findings: Secondary | ICD-10-CM | POA: Diagnosis not present

## 2019-02-03 DIAGNOSIS — N183 Chronic kidney disease, stage 3 (moderate): Secondary | ICD-10-CM | POA: Diagnosis not present

## 2019-02-03 DIAGNOSIS — E785 Hyperlipidemia, unspecified: Secondary | ICD-10-CM | POA: Diagnosis not present

## 2019-02-03 DIAGNOSIS — E1142 Type 2 diabetes mellitus with diabetic polyneuropathy: Secondary | ICD-10-CM | POA: Diagnosis not present

## 2019-02-03 DIAGNOSIS — I129 Hypertensive chronic kidney disease with stage 1 through stage 4 chronic kidney disease, or unspecified chronic kidney disease: Secondary | ICD-10-CM | POA: Diagnosis not present

## 2019-02-03 DIAGNOSIS — N401 Enlarged prostate with lower urinary tract symptoms: Secondary | ICD-10-CM | POA: Diagnosis not present

## 2019-02-03 DIAGNOSIS — Z1331 Encounter for screening for depression: Secondary | ICD-10-CM | POA: Diagnosis not present

## 2019-02-04 DIAGNOSIS — R69 Illness, unspecified: Secondary | ICD-10-CM | POA: Diagnosis not present

## 2019-02-23 DIAGNOSIS — E1139 Type 2 diabetes mellitus with other diabetic ophthalmic complication: Secondary | ICD-10-CM | POA: Diagnosis not present

## 2019-02-23 DIAGNOSIS — N183 Chronic kidney disease, stage 3 (moderate): Secondary | ICD-10-CM | POA: Diagnosis not present

## 2019-02-23 DIAGNOSIS — Z794 Long term (current) use of insulin: Secondary | ICD-10-CM | POA: Diagnosis not present

## 2019-02-23 DIAGNOSIS — I10 Essential (primary) hypertension: Secondary | ICD-10-CM | POA: Diagnosis not present

## 2019-03-18 DIAGNOSIS — R69 Illness, unspecified: Secondary | ICD-10-CM | POA: Diagnosis not present

## 2019-03-24 DIAGNOSIS — E119 Type 2 diabetes mellitus without complications: Secondary | ICD-10-CM | POA: Diagnosis not present

## 2019-04-11 DIAGNOSIS — Z794 Long term (current) use of insulin: Secondary | ICD-10-CM | POA: Diagnosis not present

## 2019-04-11 DIAGNOSIS — I10 Essential (primary) hypertension: Secondary | ICD-10-CM | POA: Diagnosis not present

## 2019-04-11 DIAGNOSIS — E11319 Type 2 diabetes mellitus with unspecified diabetic retinopathy without macular edema: Secondary | ICD-10-CM | POA: Diagnosis not present

## 2019-04-11 DIAGNOSIS — N183 Chronic kidney disease, stage 3 (moderate): Secondary | ICD-10-CM | POA: Diagnosis not present

## 2019-04-24 DIAGNOSIS — E119 Type 2 diabetes mellitus without complications: Secondary | ICD-10-CM | POA: Diagnosis not present

## 2019-04-25 DIAGNOSIS — R69 Illness, unspecified: Secondary | ICD-10-CM | POA: Diagnosis not present

## 2019-06-06 DIAGNOSIS — E119 Type 2 diabetes mellitus without complications: Secondary | ICD-10-CM | POA: Diagnosis not present

## 2019-06-24 DIAGNOSIS — R69 Illness, unspecified: Secondary | ICD-10-CM | POA: Diagnosis not present

## 2019-07-05 DIAGNOSIS — N183 Chronic kidney disease, stage 3 unspecified: Secondary | ICD-10-CM | POA: Diagnosis not present

## 2019-07-05 DIAGNOSIS — E11319 Type 2 diabetes mellitus with unspecified diabetic retinopathy without macular edema: Secondary | ICD-10-CM | POA: Diagnosis not present

## 2019-07-05 DIAGNOSIS — I10 Essential (primary) hypertension: Secondary | ICD-10-CM | POA: Diagnosis not present

## 2019-07-05 DIAGNOSIS — Z794 Long term (current) use of insulin: Secondary | ICD-10-CM | POA: Diagnosis not present

## 2019-07-06 DIAGNOSIS — E119 Type 2 diabetes mellitus without complications: Secondary | ICD-10-CM | POA: Diagnosis not present

## 2019-08-07 DIAGNOSIS — E119 Type 2 diabetes mellitus without complications: Secondary | ICD-10-CM | POA: Diagnosis not present

## 2019-09-06 DIAGNOSIS — E119 Type 2 diabetes mellitus without complications: Secondary | ICD-10-CM | POA: Diagnosis not present

## 2019-10-06 DIAGNOSIS — E119 Type 2 diabetes mellitus without complications: Secondary | ICD-10-CM | POA: Diagnosis not present

## 2019-11-07 DIAGNOSIS — E119 Type 2 diabetes mellitus without complications: Secondary | ICD-10-CM | POA: Diagnosis not present

## 2019-12-07 DIAGNOSIS — E119 Type 2 diabetes mellitus without complications: Secondary | ICD-10-CM | POA: Diagnosis not present

## 2020-01-06 DIAGNOSIS — E119 Type 2 diabetes mellitus without complications: Secondary | ICD-10-CM | POA: Diagnosis not present

## 2020-03-05 DIAGNOSIS — E119 Type 2 diabetes mellitus without complications: Secondary | ICD-10-CM | POA: Diagnosis not present

## 2020-04-09 DIAGNOSIS — E119 Type 2 diabetes mellitus without complications: Secondary | ICD-10-CM | POA: Diagnosis not present

## 2020-05-07 DIAGNOSIS — Z794 Long term (current) use of insulin: Secondary | ICD-10-CM | POA: Diagnosis not present

## 2020-05-07 DIAGNOSIS — E11311 Type 2 diabetes mellitus with unspecified diabetic retinopathy with macular edema: Secondary | ICD-10-CM | POA: Diagnosis not present

## 2020-05-07 DIAGNOSIS — N1832 Chronic kidney disease, stage 3b: Secondary | ICD-10-CM | POA: Diagnosis not present

## 2020-05-07 DIAGNOSIS — I129 Hypertensive chronic kidney disease with stage 1 through stage 4 chronic kidney disease, or unspecified chronic kidney disease: Secondary | ICD-10-CM | POA: Diagnosis not present

## 2020-05-10 LAB — LAB REPORT - SCANNED
A1c: 7.8
EGFR (Non-African Amer.): 62.9

## 2020-05-15 DIAGNOSIS — E113591 Type 2 diabetes mellitus with proliferative diabetic retinopathy without macular edema, right eye: Secondary | ICD-10-CM | POA: Diagnosis not present

## 2020-05-15 DIAGNOSIS — H35373 Puckering of macula, bilateral: Secondary | ICD-10-CM | POA: Diagnosis not present

## 2020-05-15 DIAGNOSIS — H3582 Retinal ischemia: Secondary | ICD-10-CM | POA: Diagnosis not present

## 2020-05-15 DIAGNOSIS — E113512 Type 2 diabetes mellitus with proliferative diabetic retinopathy with macular edema, left eye: Secondary | ICD-10-CM | POA: Diagnosis not present

## 2020-05-28 DIAGNOSIS — E119 Type 2 diabetes mellitus without complications: Secondary | ICD-10-CM | POA: Diagnosis not present

## 2020-06-05 DIAGNOSIS — R1084 Generalized abdominal pain: Secondary | ICD-10-CM | POA: Diagnosis not present

## 2020-06-06 ENCOUNTER — Other Ambulatory Visit: Payer: Self-pay | Admitting: Family Medicine

## 2020-06-06 DIAGNOSIS — R1084 Generalized abdominal pain: Secondary | ICD-10-CM

## 2020-06-12 ENCOUNTER — Ambulatory Visit
Admission: RE | Admit: 2020-06-12 | Discharge: 2020-06-12 | Disposition: A | Discharge status: Home / Self Care | Payer: Medicare HMO | Source: Ambulatory Visit | Attending: Family Medicine | Admitting: Family Medicine

## 2020-06-12 DIAGNOSIS — R1084 Generalized abdominal pain: Secondary | ICD-10-CM

## 2020-06-19 ENCOUNTER — Other Ambulatory Visit: Payer: Self-pay | Admitting: Family Medicine

## 2020-06-25 DIAGNOSIS — E785 Hyperlipidemia, unspecified: Secondary | ICD-10-CM | POA: Diagnosis not present

## 2020-06-25 DIAGNOSIS — Z89429 Acquired absence of other toe(s), unspecified side: Secondary | ICD-10-CM | POA: Diagnosis not present

## 2020-06-25 DIAGNOSIS — H4310 Vitreous hemorrhage, unspecified eye: Secondary | ICD-10-CM | POA: Diagnosis not present

## 2020-06-25 DIAGNOSIS — I129 Hypertensive chronic kidney disease with stage 1 through stage 4 chronic kidney disease, or unspecified chronic kidney disease: Secondary | ICD-10-CM | POA: Diagnosis not present

## 2020-06-25 DIAGNOSIS — N401 Enlarged prostate with lower urinary tract symptoms: Secondary | ICD-10-CM | POA: Diagnosis not present

## 2020-06-25 DIAGNOSIS — I10 Essential (primary) hypertension: Secondary | ICD-10-CM | POA: Diagnosis not present

## 2020-06-25 DIAGNOSIS — Z794 Long term (current) use of insulin: Secondary | ICD-10-CM | POA: Diagnosis not present

## 2020-06-25 DIAGNOSIS — R69 Illness, unspecified: Secondary | ICD-10-CM | POA: Diagnosis not present

## 2020-06-25 DIAGNOSIS — E113599 Type 2 diabetes mellitus with proliferative diabetic retinopathy without macular edema, unspecified eye: Secondary | ICD-10-CM | POA: Diagnosis not present

## 2020-06-25 DIAGNOSIS — M10079 Idiopathic gout, unspecified ankle and foot: Secondary | ICD-10-CM | POA: Diagnosis not present

## 2020-06-25 LAB — LAB REPORT - SCANNED
A1c: 6.9
Albumin/Creatinine Ratio, Urine, POC: 299.9
Creatinine, POC: 166.7 mg/dL
EGFR (Non-African Amer.): 62.9
Microalbumin, Urine: 500

## 2020-07-16 DIAGNOSIS — E119 Type 2 diabetes mellitus without complications: Secondary | ICD-10-CM | POA: Diagnosis not present

## 2020-08-15 DIAGNOSIS — E119 Type 2 diabetes mellitus without complications: Secondary | ICD-10-CM | POA: Diagnosis not present

## 2020-08-29 ENCOUNTER — Encounter (HOSPITAL_COMMUNITY): Payer: Self-pay | Admitting: Emergency Medicine

## 2020-08-29 ENCOUNTER — Emergency Department (HOSPITAL_COMMUNITY)
Admission: EM | Admit: 2020-08-29 | Discharge: 2020-08-29 | Disposition: A | Discharge status: Home / Self Care | Payer: Medicare HMO | Attending: Emergency Medicine | Admitting: Emergency Medicine

## 2020-08-29 DIAGNOSIS — Z7984 Long term (current) use of oral hypoglycemic drugs: Secondary | ICD-10-CM | POA: Insufficient documentation

## 2020-08-29 DIAGNOSIS — R079 Chest pain, unspecified: Secondary | ICD-10-CM | POA: Diagnosis not present

## 2020-08-29 DIAGNOSIS — I499 Cardiac arrhythmia, unspecified: Secondary | ICD-10-CM | POA: Diagnosis not present

## 2020-08-29 DIAGNOSIS — I4891 Unspecified atrial fibrillation: Secondary | ICD-10-CM | POA: Diagnosis not present

## 2020-08-29 DIAGNOSIS — F1290 Cannabis use, unspecified, uncomplicated: Secondary | ICD-10-CM | POA: Insufficient documentation

## 2020-08-29 DIAGNOSIS — Z89421 Acquired absence of other right toe(s): Secondary | ICD-10-CM | POA: Diagnosis not present

## 2020-08-29 DIAGNOSIS — L97409 Non-pressure chronic ulcer of unspecified heel and midfoot with unspecified severity: Secondary | ICD-10-CM | POA: Insufficient documentation

## 2020-08-29 DIAGNOSIS — R0602 Shortness of breath: Secondary | ICD-10-CM | POA: Diagnosis not present

## 2020-08-29 DIAGNOSIS — I10 Essential (primary) hypertension: Secondary | ICD-10-CM | POA: Diagnosis not present

## 2020-08-29 DIAGNOSIS — I480 Paroxysmal atrial fibrillation: Secondary | ICD-10-CM | POA: Diagnosis not present

## 2020-08-29 DIAGNOSIS — E1140 Type 2 diabetes mellitus with diabetic neuropathy, unspecified: Secondary | ICD-10-CM | POA: Diagnosis not present

## 2020-08-29 DIAGNOSIS — Z79899 Other long term (current) drug therapy: Secondary | ICD-10-CM | POA: Diagnosis not present

## 2020-08-29 DIAGNOSIS — I471 Supraventricular tachycardia: Secondary | ICD-10-CM | POA: Diagnosis not present

## 2020-08-29 DIAGNOSIS — E11621 Type 2 diabetes mellitus with foot ulcer: Secondary | ICD-10-CM | POA: Insufficient documentation

## 2020-08-29 DIAGNOSIS — E11319 Type 2 diabetes mellitus with unspecified diabetic retinopathy without macular edema: Secondary | ICD-10-CM | POA: Diagnosis not present

## 2020-08-29 DIAGNOSIS — R0789 Other chest pain: Secondary | ICD-10-CM | POA: Diagnosis not present

## 2020-08-29 LAB — BASIC METABOLIC PANEL
Anion gap: 12 (ref 5–15)
BUN: 19 mg/dL (ref 6–20)
CO2: 22 mmol/L (ref 22–32)
Calcium: 9 mg/dL (ref 8.9–10.3)
Chloride: 105 mmol/L (ref 98–111)
Creatinine, Ser: 1.27 mg/dL — ABNORMAL HIGH (ref 0.61–1.24)
GFR, Estimated: >60 mL/min (ref >60)
Glucose, Bld: 199 mg/dL — ABNORMAL HIGH (ref 70–99)
Potassium: 4.1 mmol/L (ref 3.5–5.1)
Sodium: 139 mmol/L (ref 135–145)

## 2020-08-29 LAB — CBC
HCT: 42.6 % (ref 39.0–52.0)
Hemoglobin: 14.3 g/dL (ref 13.0–17.0)
MCH: 31.5 pg (ref 26.0–34.0)
MCHC: 33.6 g/dL (ref 30.0–36.0)
MCV: 93.8 fL (ref 80.0–100.0)
Platelets: 192 10*3/uL (ref 150–400)
RBC: 4.54 MIL/uL (ref 4.22–5.81)
RDW: 12.1 % (ref 11.5–15.5)
WBC: 5.9 10*3/uL (ref 4.0–10.5)
nRBC: 0 % (ref 0.0–0.2)

## 2020-08-29 LAB — MAGNESIUM: Magnesium: 1.8 mg/dL (ref 1.7–2.4)

## 2020-08-29 MED ORDER — APIXABAN 5 MG PO TABS: 5.0000 mg | ORAL_TABLET | Freq: Two times a day (BID) | ORAL | 0 refills | Status: DC

## 2020-08-29 MED ORDER — APIXABAN 5 MG PO TABS
5.0000 mg | ORAL_TABLET | Freq: Once | ORAL | Status: AC
  Administered 2020-08-29: 5 mg via ORAL
  Filled 2020-08-29: qty 1
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
## 2020-08-29 NOTE — ED Triage Notes (Signed)
Pt arrives via [facility] from home with sudden onset of palpitations around 4am, pt found to be in afib rvr with rate 170-190. No hx of afib but does have hx of SVT which he had ablation for. Patient converted spontaneously when iv was started, HR 100, BP 144/100, 92% on ra. A/ox4, resp e/u, nad. No meds given.

## 2020-08-29 NOTE — ED Notes (Signed)
Patient verbalized understanding of dc instructions, vss, ambulatory with nad.   

## 2020-09-02 ENCOUNTER — Telehealth (HOSPITAL_COMMUNITY): Payer: Self-pay | Admitting: Nurse Practitioner

## 2020-09-02 NOTE — Telephone Encounter (Signed)
Pt had ED f/u appt scheduled 09/04/20.  Pt called today stating his sister who takes him to appts is out of town and he does not know when she will be back.  He declined to r/s & states he will cb when his sister returns.  Appt cancelled

## 2020-09-04 ENCOUNTER — Ambulatory Visit (HOSPITAL_COMMUNITY): Payer: Medicare HMO | Admitting: Nurse Practitioner

## 2020-09-16 DIAGNOSIS — E119 Type 2 diabetes mellitus without complications: Secondary | ICD-10-CM | POA: Diagnosis not present

## 2020-10-17 DIAGNOSIS — E119 Type 2 diabetes mellitus without complications: Secondary | ICD-10-CM | POA: Diagnosis not present

## 2020-11-13 DIAGNOSIS — H35373 Puckering of macula, bilateral: Secondary | ICD-10-CM | POA: Diagnosis not present

## 2020-11-13 DIAGNOSIS — E113512 Type 2 diabetes mellitus with proliferative diabetic retinopathy with macular edema, left eye: Secondary | ICD-10-CM | POA: Diagnosis not present

## 2020-11-13 DIAGNOSIS — H3582 Retinal ischemia: Secondary | ICD-10-CM | POA: Diagnosis not present

## 2020-11-13 DIAGNOSIS — E113591 Type 2 diabetes mellitus with proliferative diabetic retinopathy without macular edema, right eye: Secondary | ICD-10-CM | POA: Diagnosis not present

## 2020-11-14 DIAGNOSIS — E119 Type 2 diabetes mellitus without complications: Secondary | ICD-10-CM | POA: Diagnosis not present

## 2020-12-09 DIAGNOSIS — E669 Obesity, unspecified: Secondary | ICD-10-CM | POA: Diagnosis not present

## 2020-12-09 DIAGNOSIS — L299 Pruritus, unspecified: Secondary | ICD-10-CM | POA: Diagnosis not present

## 2020-12-09 DIAGNOSIS — N529 Male erectile dysfunction, unspecified: Secondary | ICD-10-CM | POA: Diagnosis not present

## 2020-12-09 DIAGNOSIS — I700 Atherosclerosis of aorta: Secondary | ICD-10-CM | POA: Diagnosis not present

## 2020-12-09 DIAGNOSIS — I4891 Unspecified atrial fibrillation: Secondary | ICD-10-CM | POA: Diagnosis not present

## 2020-12-09 DIAGNOSIS — L84 Corns and callosities: Secondary | ICD-10-CM | POA: Diagnosis not present

## 2020-12-09 DIAGNOSIS — E785 Hyperlipidemia, unspecified: Secondary | ICD-10-CM | POA: Diagnosis not present

## 2020-12-09 DIAGNOSIS — K635 Polyp of colon: Secondary | ICD-10-CM | POA: Diagnosis not present

## 2020-12-09 DIAGNOSIS — E113599 Type 2 diabetes mellitus with proliferative diabetic retinopathy without macular edema, unspecified eye: Secondary | ICD-10-CM | POA: Diagnosis not present

## 2020-12-09 DIAGNOSIS — R69 Illness, unspecified: Secondary | ICD-10-CM | POA: Diagnosis not present

## 2020-12-09 DIAGNOSIS — H4310 Vitreous hemorrhage, unspecified eye: Secondary | ICD-10-CM | POA: Diagnosis not present

## 2020-12-09 DIAGNOSIS — N401 Enlarged prostate with lower urinary tract symptoms: Secondary | ICD-10-CM | POA: Diagnosis not present

## 2020-12-09 DIAGNOSIS — Z89429 Acquired absence of other toe(s), unspecified side: Secondary | ICD-10-CM | POA: Diagnosis not present

## 2020-12-09 DIAGNOSIS — N1832 Chronic kidney disease, stage 3b: Secondary | ICD-10-CM | POA: Diagnosis not present

## 2020-12-09 DIAGNOSIS — G4700 Insomnia, unspecified: Secondary | ICD-10-CM | POA: Diagnosis not present

## 2020-12-09 DIAGNOSIS — Z794 Long term (current) use of insulin: Secondary | ICD-10-CM | POA: Diagnosis not present

## 2020-12-09 DIAGNOSIS — I129 Hypertensive chronic kidney disease with stage 1 through stage 4 chronic kidney disease, or unspecified chronic kidney disease: Secondary | ICD-10-CM | POA: Diagnosis not present

## 2020-12-10 LAB — LAB REPORT - SCANNED
A1c: 6.5
EGFR (Non-African Amer.): 39.4
TSH: 1.02

## 2020-12-16 DIAGNOSIS — E119 Type 2 diabetes mellitus without complications: Secondary | ICD-10-CM | POA: Diagnosis not present

## 2020-12-18 ENCOUNTER — Encounter: Payer: Self-pay | Admitting: *Deleted

## 2020-12-18 ENCOUNTER — Ambulatory Visit (INDEPENDENT_AMBULATORY_CARE_PROVIDER_SITE_OTHER): Payer: Medicare HMO

## 2020-12-18 ENCOUNTER — Other Ambulatory Visit: Payer: Self-pay | Admitting: *Deleted

## 2020-12-18 DIAGNOSIS — I4891 Unspecified atrial fibrillation: Secondary | ICD-10-CM

## 2020-12-18 NOTE — Progress Notes (Signed)
Patient ID: Billy [name], male   DOB: 09/18/1965, 55 y.o.   MRN: [id number] Patient enrolled for [employer] to ship a 3 day ZIO XT long term holter monitor to his home. Letter with instructions mailed to patient.

## 2020-12-25 ENCOUNTER — Other Ambulatory Visit (HOSPITAL_COMMUNITY): Payer: Self-pay | Admitting: Endocrinology

## 2020-12-25 DIAGNOSIS — I4891 Unspecified atrial fibrillation: Secondary | ICD-10-CM

## 2020-12-30 ENCOUNTER — Telehealth (HOSPITAL_COMMUNITY): Payer: Self-pay | Admitting: *Deleted

## 2020-12-30 NOTE — Telephone Encounter (Signed)
Returning patients call about needing to change his echocardiogram time.  Left message for patient to call me back.

## 2021-01-12 DIAGNOSIS — I4891 Unspecified atrial fibrillation: Secondary | ICD-10-CM

## 2021-01-15 DIAGNOSIS — E119 Type 2 diabetes mellitus without complications: Secondary | ICD-10-CM | POA: Diagnosis not present

## 2021-01-22 DIAGNOSIS — I4891 Unspecified atrial fibrillation: Secondary | ICD-10-CM | POA: Diagnosis not present

## 2021-01-27 ENCOUNTER — Other Ambulatory Visit (HOSPITAL_COMMUNITY): Payer: Medicare HMO

## 2021-01-30 ENCOUNTER — Ambulatory Visit (HOSPITAL_COMMUNITY): Payer: Medicare HMO | Attending: Cardiology

## 2021-01-30 ENCOUNTER — Other Ambulatory Visit: Payer: Self-pay

## 2021-01-30 DIAGNOSIS — I4891 Unspecified atrial fibrillation: Secondary | ICD-10-CM

## 2021-01-30 LAB — ECHOCARDIOGRAM COMPLETE
Area-P 1/2: 2.5 cm2
S' Lateral: 3.6 cm

## 2021-02-04 DIAGNOSIS — J069 Acute upper respiratory infection, unspecified: Secondary | ICD-10-CM | POA: Diagnosis not present

## 2021-02-04 DIAGNOSIS — R0981 Nasal congestion: Secondary | ICD-10-CM | POA: Diagnosis not present

## 2021-02-04 DIAGNOSIS — I129 Hypertensive chronic kidney disease with stage 1 through stage 4 chronic kidney disease, or unspecified chronic kidney disease: Secondary | ICD-10-CM | POA: Diagnosis not present

## 2021-02-04 DIAGNOSIS — N1832 Chronic kidney disease, stage 3b: Secondary | ICD-10-CM | POA: Diagnosis not present

## 2021-02-04 DIAGNOSIS — R059 Cough, unspecified: Secondary | ICD-10-CM | POA: Diagnosis not present

## 2021-02-04 DIAGNOSIS — Z1152 Encounter for screening for COVID-19: Secondary | ICD-10-CM | POA: Diagnosis not present

## 2021-02-08 ENCOUNTER — Ambulatory Visit (HOSPITAL_COMMUNITY)
Admission: EM | Admit: 2021-02-08 | Discharge: 2021-02-08 | Disposition: A | Discharge status: Home / Self Care | Payer: Medicare HMO | Attending: Emergency Medicine | Admitting: Emergency Medicine

## 2021-02-08 ENCOUNTER — Encounter (HOSPITAL_COMMUNITY): Payer: Self-pay

## 2021-02-08 DIAGNOSIS — R0981 Nasal congestion: Secondary | ICD-10-CM | POA: Diagnosis not present

## 2021-02-08 DIAGNOSIS — W57XXXA Bitten or stung by nonvenomous insect and other nonvenomous arthropods, initial encounter: Secondary | ICD-10-CM

## 2021-02-08 MED ORDER — DOXYCYCLINE HYCLATE 100 MG PO CAPS: 100.0000 mg | ORAL_CAPSULE | Freq: Two times a day (BID) | ORAL | 0 refills | Status: AC
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
## 2021-02-08 NOTE — ED Triage Notes (Signed)
Pt c/o nasal congestion and sinus pressure X 1 week. Pt states he pulled a tic out of his leg and states he has been feeling fatigued.

## 2021-02-08 NOTE — Discharge Instructions (Addendum)
Take Doxycycline twice daily for seven days.  Take Flonase once daily each side for seven days.

## 2021-02-15 DIAGNOSIS — E119 Type 2 diabetes mellitus without complications: Secondary | ICD-10-CM | POA: Diagnosis not present

## 2021-03-18 DIAGNOSIS — E119 Type 2 diabetes mellitus without complications: Secondary | ICD-10-CM | POA: Diagnosis not present

## 2021-04-18 DIAGNOSIS — E119 Type 2 diabetes mellitus without complications: Secondary | ICD-10-CM | POA: Diagnosis not present

## 2021-05-09 DIAGNOSIS — R809 Proteinuria, unspecified: Secondary | ICD-10-CM | POA: Diagnosis not present

## 2021-05-09 DIAGNOSIS — Z794 Long term (current) use of insulin: Secondary | ICD-10-CM | POA: Diagnosis not present

## 2021-05-09 DIAGNOSIS — E1122 Type 2 diabetes mellitus with diabetic chronic kidney disease: Secondary | ICD-10-CM | POA: Diagnosis not present

## 2021-05-09 DIAGNOSIS — I129 Hypertensive chronic kidney disease with stage 1 through stage 4 chronic kidney disease, or unspecified chronic kidney disease: Secondary | ICD-10-CM | POA: Diagnosis not present

## 2021-05-09 DIAGNOSIS — R69 Illness, unspecified: Secondary | ICD-10-CM | POA: Diagnosis not present

## 2021-05-09 DIAGNOSIS — E785 Hyperlipidemia, unspecified: Secondary | ICD-10-CM | POA: Diagnosis not present

## 2021-05-09 DIAGNOSIS — N1832 Chronic kidney disease, stage 3b: Secondary | ICD-10-CM | POA: Diagnosis not present

## 2021-05-09 DIAGNOSIS — E669 Obesity, unspecified: Secondary | ICD-10-CM | POA: Diagnosis not present

## 2021-05-09 DIAGNOSIS — N401 Enlarged prostate with lower urinary tract symptoms: Secondary | ICD-10-CM | POA: Diagnosis not present

## 2021-05-09 DIAGNOSIS — I4891 Unspecified atrial fibrillation: Secondary | ICD-10-CM | POA: Diagnosis not present

## 2021-05-09 DIAGNOSIS — N189 Chronic kidney disease, unspecified: Secondary | ICD-10-CM | POA: Diagnosis not present

## 2021-05-09 DIAGNOSIS — E113599 Type 2 diabetes mellitus with proliferative diabetic retinopathy without macular edema, unspecified eye: Secondary | ICD-10-CM | POA: Diagnosis not present

## 2021-05-09 DIAGNOSIS — I700 Atherosclerosis of aorta: Secondary | ICD-10-CM | POA: Diagnosis not present

## 2021-05-09 DIAGNOSIS — M10079 Idiopathic gout, unspecified ankle and foot: Secondary | ICD-10-CM | POA: Diagnosis not present

## 2021-05-09 DIAGNOSIS — M109 Gout, unspecified: Secondary | ICD-10-CM | POA: Diagnosis not present

## 2021-05-09 LAB — HEMOGLOBIN A1C: A1c: 7

## 2021-05-20 DIAGNOSIS — E119 Type 2 diabetes mellitus without complications: Secondary | ICD-10-CM | POA: Diagnosis not present

## 2021-06-18 ENCOUNTER — Telehealth: Payer: Self-pay | Admitting: Cardiovascular Disease

## 2021-06-18 NOTE — Telephone Encounter (Signed)
Spoke to patient he stated he has appointment with Dr.[place] 10/17 at 1:40 pm.Stated he wanted to make sure she could see his recent monitor and echo.He also had a recent ekg with EMS.Advised monitor and echo are in his chart.Unable to view EMS ekg.Patient seemed very anxious wanting to know what to expect when he sees Dr.[place].Stated he has been having irregular heart beat hard to sleep at night.Advised to bring a list of all medications.Advised to go to ED if he worsens.

## 2021-06-18 NOTE — Telephone Encounter (Signed)
Pt is calling with concerns about his first visit, Pt wants to know if Dr.[place] have sent the following information to Dr. Oval Linsey:  Ambulance ride from December 2021  Zio Patch pt wore for 3 days 3 months ago  Ultrasound results from his most recent U/S (Date unknown)  Pt wants to know if he should come to [facility] before his first appt with Dr. Oval Linsey to get blood work

## 2021-06-19 DIAGNOSIS — E119 Type 2 diabetes mellitus without complications: Secondary | ICD-10-CM | POA: Diagnosis not present

## 2021-06-24 ENCOUNTER — Emergency Department (HOSPITAL_BASED_OUTPATIENT_CLINIC_OR_DEPARTMENT_OTHER): Payer: Medicare HMO

## 2021-06-24 ENCOUNTER — Encounter (HOSPITAL_BASED_OUTPATIENT_CLINIC_OR_DEPARTMENT_OTHER): Payer: Self-pay

## 2021-06-24 ENCOUNTER — Other Ambulatory Visit: Payer: Self-pay

## 2021-06-24 ENCOUNTER — Observation Stay (HOSPITAL_BASED_OUTPATIENT_CLINIC_OR_DEPARTMENT_OTHER)
Admission: EM | Admit: 2021-06-24 | Discharge: 2021-06-25 | Disposition: A | Discharge status: Home / Self Care | Payer: Medicare HMO | Attending: Internal Medicine | Admitting: Internal Medicine

## 2021-06-24 DIAGNOSIS — Z79899 Other long term (current) drug therapy: Secondary | ICD-10-CM | POA: Diagnosis not present

## 2021-06-24 DIAGNOSIS — I160 Hypertensive urgency: Secondary | ICD-10-CM | POA: Diagnosis not present

## 2021-06-24 DIAGNOSIS — Z20822 Contact with and (suspected) exposure to covid-19: Secondary | ICD-10-CM | POA: Insufficient documentation

## 2021-06-24 DIAGNOSIS — R079 Chest pain, unspecified: Secondary | ICD-10-CM | POA: Diagnosis not present

## 2021-06-24 DIAGNOSIS — R002 Palpitations: Secondary | ICD-10-CM | POA: Insufficient documentation

## 2021-06-24 DIAGNOSIS — R778 Other specified abnormalities of plasma proteins: Secondary | ICD-10-CM | POA: Diagnosis not present

## 2021-06-24 DIAGNOSIS — N289 Disorder of kidney and ureter, unspecified: Secondary | ICD-10-CM

## 2021-06-24 DIAGNOSIS — N182 Chronic kidney disease, stage 2 (mild): Secondary | ICD-10-CM | POA: Insufficient documentation

## 2021-06-24 DIAGNOSIS — I10 Essential (primary) hypertension: Secondary | ICD-10-CM | POA: Diagnosis not present

## 2021-06-24 DIAGNOSIS — E1122 Type 2 diabetes mellitus with diabetic chronic kidney disease: Secondary | ICD-10-CM | POA: Insufficient documentation

## 2021-06-24 DIAGNOSIS — I129 Hypertensive chronic kidney disease with stage 1 through stage 4 chronic kidney disease, or unspecified chronic kidney disease: Secondary | ICD-10-CM | POA: Insufficient documentation

## 2021-06-24 DIAGNOSIS — F419 Anxiety disorder, unspecified: Secondary | ICD-10-CM | POA: Diagnosis present

## 2021-06-24 DIAGNOSIS — F32A Depression, unspecified: Secondary | ICD-10-CM | POA: Diagnosis present

## 2021-06-24 DIAGNOSIS — E11621 Type 2 diabetes mellitus with foot ulcer: Secondary | ICD-10-CM | POA: Diagnosis present

## 2021-06-24 LAB — BASIC METABOLIC PANEL
Anion gap: 7 (ref 5–15)
BUN: 24 mg/dL — ABNORMAL HIGH (ref 6–20)
CO2: 29 mmol/L (ref 22–32)
Calcium: 8.7 mg/dL — ABNORMAL LOW (ref 8.9–10.3)
Chloride: 102 mmol/L (ref 98–111)
Creatinine, Ser: 1.77 mg/dL — ABNORMAL HIGH (ref 0.61–1.24)
GFR, Estimated: 45 mL/min — ABNORMAL LOW (ref >60)
Glucose, Bld: 151 mg/dL — ABNORMAL HIGH (ref 70–99)
Potassium: 4.1 mmol/L (ref 3.5–5.1)
Sodium: 138 mmol/L (ref 135–145)

## 2021-06-24 LAB — CBC WITH DIFFERENTIAL/PLATELET
Abs Immature Granulocytes: 0.02 10*3/uL (ref 0.00–0.07)
Basophils Absolute: 0 10*3/uL (ref 0.0–0.1)
Basophils Relative: 1 %
Eosinophils Absolute: 0.2 10*3/uL (ref 0.0–0.5)
Eosinophils Relative: 2 %
HCT: 42.1 % (ref 39.0–52.0)
Hemoglobin: 14.6 g/dL (ref 13.0–17.0)
Immature Granulocytes: 0 %
Lymphocytes Relative: 30 %
Lymphs Abs: 1.9 10*3/uL (ref 0.7–4.0)
MCH: 31.1 pg (ref 26.0–34.0)
MCHC: 34.7 g/dL (ref 30.0–36.0)
MCV: 89.6 fL (ref 80.0–100.0)
Monocytes Absolute: 0.4 10*3/uL (ref 0.1–1.0)
Monocytes Relative: 7 %
Neutro Abs: 3.8 10*3/uL (ref 1.7–7.7)
Neutrophils Relative %: 60 %
Platelets: 193 10*3/uL (ref 150–400)
RBC: 4.7 MIL/uL (ref 4.22–5.81)
RDW: 12.3 % (ref 11.5–15.5)
WBC: 6.3 10*3/uL (ref 4.0–10.5)
nRBC: 0 % (ref 0.0–0.2)

## 2021-06-24 LAB — TROPONIN I (HIGH SENSITIVITY): Troponin I (High Sensitivity): 38 ng/L — ABNORMAL HIGH (ref <18)

## 2021-06-24 MED ORDER — AMLODIPINE BESYLATE 5 MG PO TABS
5.0000 mg | ORAL_TABLET | Freq: Once | ORAL | Status: AC
  Administered 2021-06-24: 5 mg via ORAL
  Filled 2021-06-24: qty 1

## 2021-06-24 MED ORDER — HYDROCHLOROTHIAZIDE 25 MG PO TABS
25.0000 mg | ORAL_TABLET | Freq: Once | ORAL | Status: AC
  Administered 2021-06-24: 25 mg via ORAL
  Filled 2021-06-24: qty 1

## 2021-06-24 NOTE — ED Triage Notes (Signed)
Pt c/o CP x 5-7 days-denies fever/flu sx-NAD-steady gait

## 2021-06-25 ENCOUNTER — Other Ambulatory Visit: Payer: Self-pay

## 2021-06-25 ENCOUNTER — Observation Stay: Payer: Medicare HMO

## 2021-06-25 ENCOUNTER — Observation Stay (HOSPITAL_BASED_OUTPATIENT_CLINIC_OR_DEPARTMENT_OTHER): Payer: Medicare HMO

## 2021-06-25 ENCOUNTER — Encounter (HOSPITAL_BASED_OUTPATIENT_CLINIC_OR_DEPARTMENT_OTHER): Payer: Self-pay | Admitting: Emergency Medicine

## 2021-06-25 ENCOUNTER — Other Ambulatory Visit: Payer: Self-pay | Admitting: Physician Assistant

## 2021-06-25 DIAGNOSIS — I10 Essential (primary) hypertension: Secondary | ICD-10-CM | POA: Diagnosis not present

## 2021-06-25 DIAGNOSIS — R002 Palpitations: Secondary | ICD-10-CM | POA: Diagnosis not present

## 2021-06-25 DIAGNOSIS — F419 Anxiety disorder, unspecified: Secondary | ICD-10-CM | POA: Diagnosis not present

## 2021-06-25 DIAGNOSIS — I480 Paroxysmal atrial fibrillation: Secondary | ICD-10-CM | POA: Insufficient documentation

## 2021-06-25 DIAGNOSIS — Z79899 Other long term (current) drug therapy: Secondary | ICD-10-CM | POA: Diagnosis not present

## 2021-06-25 DIAGNOSIS — E11621 Type 2 diabetes mellitus with foot ulcer: Secondary | ICD-10-CM | POA: Diagnosis not present

## 2021-06-25 DIAGNOSIS — I160 Hypertensive urgency: Secondary | ICD-10-CM | POA: Diagnosis not present

## 2021-06-25 DIAGNOSIS — R079 Chest pain, unspecified: Secondary | ICD-10-CM | POA: Diagnosis not present

## 2021-06-25 DIAGNOSIS — E1122 Type 2 diabetes mellitus with diabetic chronic kidney disease: Secondary | ICD-10-CM | POA: Diagnosis not present

## 2021-06-25 DIAGNOSIS — F32A Depression, unspecified: Secondary | ICD-10-CM

## 2021-06-25 DIAGNOSIS — R69 Illness, unspecified: Secondary | ICD-10-CM | POA: Diagnosis not present

## 2021-06-25 DIAGNOSIS — N182 Chronic kidney disease, stage 2 (mild): Secondary | ICD-10-CM | POA: Diagnosis not present

## 2021-06-25 DIAGNOSIS — Z20822 Contact with and (suspected) exposure to covid-19: Secondary | ICD-10-CM | POA: Diagnosis not present

## 2021-06-25 DIAGNOSIS — I129 Hypertensive chronic kidney disease with stage 1 through stage 4 chronic kidney disease, or unspecified chronic kidney disease: Secondary | ICD-10-CM | POA: Diagnosis not present

## 2021-06-25 DIAGNOSIS — L97509 Non-pressure chronic ulcer of other part of unspecified foot with unspecified severity: Secondary | ICD-10-CM

## 2021-06-25 DIAGNOSIS — N289 Disorder of kidney and ureter, unspecified: Secondary | ICD-10-CM | POA: Diagnosis not present

## 2021-06-25 LAB — LIPID PANEL
Cholesterol: 239 mg/dL — ABNORMAL HIGH (ref 0–200)
HDL: 40 mg/dL — ABNORMAL LOW (ref >40)
LDL Cholesterol: 185 mg/dL — ABNORMAL HIGH (ref 0–99)
Total CHOL/HDL Ratio: 6 RATIO
Triglycerides: 68 mg/dL (ref <150)
VLDL: 14 mg/dL (ref 0–40)

## 2021-06-25 LAB — BASIC METABOLIC PANEL
Anion gap: 8 (ref 5–15)
BUN: 23 mg/dL — ABNORMAL HIGH (ref 6–20)
CO2: 28 mmol/L (ref 22–32)
Calcium: 9.1 mg/dL (ref 8.9–10.3)
Chloride: 105 mmol/L (ref 98–111)
Creatinine, Ser: 1.65 mg/dL — ABNORMAL HIGH (ref 0.61–1.24)
GFR, Estimated: 48 mL/min — ABNORMAL LOW (ref >60)
Glucose, Bld: 135 mg/dL — ABNORMAL HIGH (ref 70–99)
Potassium: 4 mmol/L (ref 3.5–5.1)
Sodium: 141 mmol/L (ref 135–145)

## 2021-06-25 LAB — RAPID URINE DRUG SCREEN, HOSP PERFORMED
Amphetamines: NOT DETECTED
Barbiturates: NOT DETECTED
Benzodiazepines: POSITIVE — AB
Cocaine: NOT DETECTED
Opiates: NOT DETECTED
Tetrahydrocannabinol: POSITIVE — AB

## 2021-06-25 LAB — GLUCOSE, CAPILLARY
Glucose-Capillary: 131 mg/dL — ABNORMAL HIGH (ref 70–99)
Glucose-Capillary: 115 mg/dL — ABNORMAL HIGH (ref 70–99)
Glucose-Capillary: 122 mg/dL — ABNORMAL HIGH (ref 70–99)
Glucose-Capillary: 106 mg/dL — ABNORMAL HIGH (ref 70–99)

## 2021-06-25 LAB — RESP PANEL BY RT-PCR (FLU A&B, COVID) ARPGX2
Influenza A by PCR: NEGATIVE
Influenza B by PCR: NEGATIVE
SARS Coronavirus 2 by RT PCR: NEGATIVE

## 2021-06-25 LAB — ECHOCARDIOGRAM COMPLETE
Area-P 1/2: 2 cm2
Height: 74 in
S' Lateral: 3.9 cm
Weight: 4048 oz

## 2021-06-25 LAB — MAGNESIUM: Magnesium: 1.8 mg/dL (ref 1.7–2.4)

## 2021-06-25 LAB — HEMOGLOBIN A1C
Hgb A1c MFr Bld: 6.8 % — ABNORMAL HIGH (ref 4.8–5.6)
Mean Plasma Glucose: 148.46 mg/dL

## 2021-06-25 LAB — TSH: TSH: 2.262 u[IU]/mL (ref 0.350–4.500)

## 2021-06-25 LAB — HEPARIN LEVEL (UNFRACTIONATED): Heparin Unfractionated: 0.31 IU/mL (ref 0.30–0.70)

## 2021-06-25 LAB — CBG MONITORING, ED: Glucose-Capillary: 100 mg/dL — ABNORMAL HIGH (ref 70–99)

## 2021-06-25 LAB — TROPONIN I (HIGH SENSITIVITY): Troponin I (High Sensitivity): 44 ng/L — ABNORMAL HIGH (ref <18)

## 2021-06-25 LAB — HIV ANTIBODY (ROUTINE TESTING W REFLEX): HIV Screen 4th Generation wRfx: NONREACTIVE

## 2021-06-25 MED ORDER — ALPRAZOLAM 1 MG PO TABS
2.0000 mg | ORAL_TABLET | Freq: Three times a day (TID) | ORAL | Status: DC | PRN
  Administered 2021-06-25 (×2): 2 mg via ORAL
  Filled 2021-06-25 (×2): qty 2

## 2021-06-25 MED ORDER — ACETAMINOPHEN 325 MG PO TABS: 650.0000 mg | ORAL_TABLET | ORAL | Status: DC | PRN

## 2021-06-25 MED ORDER — HYDRALAZINE HCL 20 MG/ML IJ SOLN
5.0000 mg | Freq: Once | INTRAMUSCULAR | Status: AC
  Administered 2021-06-25: 5 mg via INTRAVENOUS
  Filled 2021-06-25: qty 1

## 2021-06-25 MED ORDER — KETOROLAC TROMETHAMINE 30 MG/ML IJ SOLN: 30.0000 mg | Freq: Once | INTRAMUSCULAR | Status: DC

## 2021-06-25 MED ORDER — AMLODIPINE BESYLATE 5 MG PO TABS
5.0000 mg | ORAL_TABLET | Freq: Every day | ORAL | 0 refills | Status: DC
Start: 2021-06-25 — End: 2021-08-06

## 2021-06-25 MED ORDER — HEPARIN BOLUS VIA INFUSION
6000.0000 [IU] | Freq: Once | INTRAVENOUS | Status: AC
  Administered 2021-06-25: 6000 [IU] via INTRAVENOUS

## 2021-06-25 MED ORDER — NITROGLYCERIN 2 % TD OINT
1.0000 [in_us] | TOPICAL_OINTMENT | Freq: Once | TRANSDERMAL | Status: AC
  Administered 2021-06-25: 1 [in_us] via TOPICAL
  Filled 2021-06-25: qty 1

## 2021-06-25 MED ORDER — GABAPENTIN 100 MG PO CAPS
200.0000 mg | ORAL_CAPSULE | Freq: Two times a day (BID) | ORAL | Status: DC
  Administered 2021-06-25: 200 mg via ORAL
  Filled 2021-06-25: qty 2

## 2021-06-25 MED ORDER — METOPROLOL SUCCINATE ER 25 MG PO TB24
25.0000 mg | ORAL_TABLET | Freq: Every day | ORAL | Status: DC
  Administered 2021-06-25: 25 mg via ORAL
  Filled 2021-06-25 (×2): qty 1

## 2021-06-25 MED ORDER — HEPARIN (PORCINE) 25000 UT/250ML-% IV SOLN
1400.0000 [IU]/h | INTRAVENOUS | Status: DC
  Administered 2021-06-25: 1400 [IU]/h via INTRAVENOUS
  Filled 2021-06-25 (×2): qty 250

## 2021-06-25 MED ORDER — SODIUM CHLORIDE 0.9 % IV SOLN: INTRAVENOUS | Status: DC | PRN

## 2021-06-25 MED ORDER — TRAZODONE HCL 100 MG PO TABS: 100.0000 mg | ORAL_TABLET | Freq: Every day | ORAL | Status: DC

## 2021-06-25 MED ORDER — INSULIN ASPART 100 UNIT/ML IJ SOLN
0.0000 [IU] | INTRAMUSCULAR | Status: DC
Start: 2021-06-25 — End: 2021-06-25
  Administered 2021-06-25: 1 [IU] via SUBCUTANEOUS

## 2021-06-25 MED ORDER — LORAZEPAM 1 MG PO TABS
0.5000 mg | ORAL_TABLET | Freq: Once | ORAL | Status: AC
  Administered 2021-06-25: 0.5 mg via ORAL
  Filled 2021-06-25: qty 1

## 2021-06-25 MED ORDER — METOPROLOL TARTRATE 25 MG PO TABS
25.0000 mg | ORAL_TABLET | Freq: Once | ORAL | Status: AC
  Administered 2021-06-25: 25 mg via ORAL
  Filled 2021-06-25: qty 1

## 2021-06-25 MED ORDER — ACETAMINOPHEN 500 MG PO TABS
1000.0000 mg | ORAL_TABLET | Freq: Once | ORAL | Status: AC
  Administered 2021-06-25: 1000 mg via ORAL
  Filled 2021-06-25: qty 2

## 2021-06-25 MED ORDER — ONDANSETRON HCL 4 MG/2ML IJ SOLN: 4.0000 mg | Freq: Four times a day (QID) | INTRAMUSCULAR | Status: DC | PRN

## 2021-06-25 NOTE — Progress Notes (Addendum)
ANTICOAGULATION CONSULT NOTE - Follow Up Consult  Pharmacy Consult for Heparin Indication: chest pain/ACS  No Known Allergies  Patient Measurements: Height: 6\' 2"  (188 cm) Weight: 114.8 kg (253 lb) IBW/kg (Calculated) : 82.2 Heparin Dosing Weight: 106.4 kg  Vital Signs: Temp: 98.8 F (37.1 C) (10/12 1210) Temp Source: Oral (10/12 1210) BP: 142/93 (10/12 1210) Pulse Rate: 80 (10/12 1210)  Labs: Recent Labs    06/24/21 2310 06/25/21 0300 06/25/21 0547  HGB 14.6  --   --   HCT 42.1  --   --   PLT 193  --   --   CREATININE 1.77*  --  1.65*  TROPONINIHS 38* 44*  --     Estimated Creatinine Clearance: 67.3 mL/min (A) (by C-G formula based on SCr of 1.65 mg/dL (H)).   Medications:  Infusions:   sodium chloride     heparin 1,400 Units/hr (06/25/21 0434)    Assessment: 72 yoM presented to ED on 10/11 with chest pain, palpitations.  He has a questionable history of atrial fibrillation. Has been prescribed apixaban in the past but is currently NOT taking. Pharmacy is consulted for heparin dosing.    Today, 06/25/2021: Heparin level 0.31, therapeutic on heparin 1400 units/hr CBC: Hgb and Plt remain stable No bleeding or IV complications reported.   Goal of Therapy:  Heparin level 0.3-0.7 units/ml Monitor platelets by anticoagulation protocol: Yes   Plan:  Continue heparin IV infusion at 1400 units/hr Confirmatory heparin level in 6 hours Daily heparin level and CBC   Gretta Arab PharmD, BCPS Clinical Pharmacist [facility] main pharmacy [phone] 06/25/2021 12:45 PM  Addendum: Heparin infusion d/c by cardiology. Dr Roderic Palau will assess need for VTE prophylaxis.    Gretta Arab PharmD, BCPS Clinical Pharmacist [facility] [phone] 06/25/2021 2:30 PM
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
## 2021-06-25 NOTE — Discharge Summary (Signed)
Physician Discharge Summary  Billy [name] QIH:[id number] DOB: 05-13-1966 DOA: 06/24/2021  PCP: Reynold Bowen, MD  Admit date: 06/24/2021 Discharge date: 06/25/2021  Admitted From: Home Disposition: Home  Recommendations for Outpatient Follow-up:  Follow-up scheduled with cardiology on 10/17 Arrangements have been made for patient to have outpatient stress test as well as 14-day Zio patch He already has follow-up with nephrology on 10/31  Home Health: Equipment/Devices:  Discharge Condition: Stable CODE STATUS: Full code Diet recommendation: Heart healthy  Brief/Interim Summary: Billy [name] is a 55 y.o. male with medical history significant for anxiety, depression, type 2 diabetes mellitus, chronic renal insufficiency, and remote SVT ablation, now presenting to the emergency department for evaluation of chest discomfort.  The patient reports a long history of occasional palpitations lasting up to 15 minutes but has had more frequent episodes recently.  He states that for the past week, he develops chest discomfort with palpitations every night while trying to sleep, often keeping him up at night.  There is no associated shortness of breath, nausea, or diaphoresis.  He denies any cough or leg swelling.     He was seen in the emergency department for similar episode in December 2021 when there was concern for possible atrial fibrillation on a rhythm strip obtained en route to the ED. He was in sinus rhythm on arrival to the ED at that time, was prescribed 30 days of Eliquis, and referred to atrial fibrillation clinic but the patient does not recall being prescribed the blood thinner or following up with cardiology at that time.   He wore a Zio patch for 3 days in April with some brief runs of SVT but no atrial fibrillation.  He also had echocardiogram in April with EF 50 to 55%, mild LVH, and grade 1 diastolic dysfunction.  Discharge Diagnoses:  Principal Problem:   Chest  pain Active Problems:   Diabetes mellitus with ulcer of toe (HCC)   Anxiety and depression   Renal insufficiency   Hypertensive urgency  Chest pain -No significant elevation of troponin -EKG without evidence of ischemia -Suspect that her symptoms may be related to palpitations versus high blood pressure -Seen by cardiology -Echocardiogram noted to be unchanged from 01/2021 -Plans are for outpatient stress test -Cardiology follow-up is also been scheduled  Palpitations -History of SVT status post ablation 25 years ago -No significant arrhythmias noted since admission -He has been set up with 14-day Zio patch on discharge -We discussed beta-blockers, but patient does not wish to have beta-blockers at this time -Currently no indication for anticoagulation  Hypertension -Chronically on ramipril -Blood pressure elevated on admission -Start on amlodipine on discharge  Type 2 diabetes -A1c 6.8 -Continue outpatient regimen on discharge  Chronic kidney disease, stage II, but possibly progression to stage IIIa -Creatinine 1.6 during his hospitalization -He is followed by [facility], Dr. Moshe Cipro -Reviewed his care with Dr. Candiss Norse on-call for nephrology and is reported that in August, his creatinine was also 1.6 at that time -Recommendations were to continue current dose of ramipril -He already has follow-up with nephrology scheduled for 10/31 at which time labs can be repeated.  Discharge Instructions  Discharge Instructions     Diet - low sodium heart healthy   Complete by: As directed    Increase activity slowly   Complete by: As directed       Allergies as of 06/25/2021   No Known Allergies      Medication List     STOP taking  these medications    apixaban 5 MG Tabs tablet Commonly known as: ELIQUIS   ibuprofen 200 MG tablet Commonly known as: ADVIL   malathion 0.5 % lotion Commonly known as: OVIDE   mupirocin ointment 2 % Commonly known as:  BACTROBAN   Vicks VapoRub 4.7-1.2-2.6 % Oint   zolpidem 10 MG tablet Commonly known as: AMBIEN       TAKE these medications    alprazolam 2 MG tablet Commonly known as: XANAX Take 1 tablet (2 mg total) by mouth 3 (three) times daily as needed for anxiety.   amLODipine 5 MG tablet Commonly known as: NORVASC Take 1 tablet (5 mg total) by mouth daily.   B-D ULTRAFINE III SHORT PEN 31G X 8 MM Misc Generic drug: Insulin Pen Needle USE TO ADMINISTER INSULIN DAILY E11..39   freestyle lancets CHECK BLOOD SUGAR 3 TIMES DAILY   gabapentin 100 MG capsule Commonly known as: NEURONTIN Take 200 mg by mouth 2 (two) times daily. Per patient, takes all 4 capsules at Bedtime   linagliptin 5 MG Tabs tablet Commonly known as: TRADJENTA Take 5 mg by mouth daily. Reported on 09/09/2015   MAGNESIUM CITRATE PO Take 1 scoop by mouth daily as needed.   multivitamin with minerals Tabs tablet Take 1 tablet by mouth daily.   ramipril 10 MG capsule Commonly known as: ALTACE Take 10 mg by mouth daily. Reported on 09/09/2015   traZODone 100 MG tablet Commonly known as: DESYREL Take 100 mg by mouth at bedtime.   Tyler Aas FlexTouch 100 UNIT/ML FlexTouch Pen Generic drug: insulin degludec Inject 10-30 units subQ PRN        Follow-up Information     Skeet Latch, MD Follow up on 06/30/2021.   Specialty: Cardiology Why: 1:40pm  Heart monitor will be sent to you Contact information: [place] [address] [phone]         Corliss Parish, MD Follow up on 07/14/2021.   Specialty: Nephrology Why: 8:20am  they will do blood work at the appointment Contact information: [place] [address] [phone]                No Known Allergies  Consultations: Cardiology   Procedures/Studies: DG Chest Portable 1 View  Result Date: 06/24/2021 CLINICAL DATA:  Chest pain x 5-7 days. EXAM: PORTABLE CHEST 1 VIEW COMPARISON:  April 18, 2011 FINDINGS: The heart size and mediastinal contours are within normal limits. Both lungs are clear. The visualized skeletal structures are unremarkable. IMPRESSION: No active disease. Electronically Signed   By: Virgina Norfolk M.D.   On: 06/24/2021 23:48   ECHOCARDIOGRAM COMPLETE  Result Date: 06/25/2021    ECHOCARDIOGRAM REPORT   Patient Name:   Billy [name] Date of Exam: 06/25/2021 Medical Rec #:  [id number]      Height:       74.0 in Accession #:    [id number]     Weight:       253.0 lb Date of Birth:  1966/01/30      BSA:          2.402 m Patient Age:    55 years       BP:           142/93 mmHg Patient Gender: M              HR:           85 bpm. Exam Location:  Inpatient Procedure: 2D Echo, Cardiac Doppler and  Color Doppler Indications:    R07.9* Chest pain, unspecified  History:        Patient has prior history of Echocardiogram examinations, most                 recent 01/30/2021. Risk Factors:Diabetes.  Sonographer:    Bernadene Person RDCS Referring Phys: [id number] [place]  1. Septal / inferior basal hypokinesis EF similar to estimated on TTE 01/30/21. Left ventricular ejection fraction, by estimation, is 50 to 55%. The left ventricle has low normal function. The left ventricle has no regional wall motion abnormalities. The  left ventricular internal cavity size was mildly dilated. Left ventricular diastolic parameters are consistent with Grade I diastolic dysfunction (impaired relaxation).  2. Right ventricular systolic function is normal. The right ventricular size is normal. There is normal pulmonary artery systolic pressure.  3. The mitral valve is abnormal. No evidence of mitral valve regurgitation. No evidence of mitral stenosis. Moderate to severe mitral annular calcification.  4. The aortic valve is tricuspid. Aortic valve regurgitation is not visualized. Mild to moderate aortic valve sclerosis/calcification is present, without any evidence of aortic stenosis.  5.  Aortic dilatation noted. There is mild dilatation of the aortic root, measuring 39 mm.  6. The inferior vena cava is normal in size with greater than 50% respiratory variability, suggesting right atrial pressure of 3 mmHg. FINDINGS  Left Ventricle: Septal / inferior basal hypokinesis EF similar to estimated on TTE 01/30/21. Left ventricular ejection fraction, by estimation, is 50 to 55%. The left ventricle has low normal function. The left ventricle has no regional wall motion abnormalities. The left ventricular internal cavity size was mildly dilated. There is no left ventricular hypertrophy. Left ventricular diastolic parameters are consistent with Grade I diastolic dysfunction (impaired relaxation). Right Ventricle: The right ventricular size is normal. No increase in right ventricular wall thickness. Right ventricular systolic function is normal. There is normal pulmonary artery systolic pressure. The tricuspid regurgitant velocity is 2.20 m/s, and  with an assumed right atrial pressure of 3 mmHg, the estimated right ventricular systolic pressure is 61.4 mmHg. Left Atrium: Left atrial size was normal in size. Right Atrium: Right atrial size was normal in size. Pericardium: There is no evidence of pericardial effusion. Mitral Valve: The mitral valve is abnormal. There is mild thickening of the mitral valve leaflet(s). There is mild calcification of the mitral valve leaflet(s). Moderate to severe mitral annular calcification. No evidence of mitral valve regurgitation. No evidence of mitral valve stenosis. Tricuspid Valve: The tricuspid valve is normal in structure. Tricuspid valve regurgitation is trivial. No evidence of tricuspid stenosis. Aortic Valve: The aortic valve is tricuspid. Aortic valve regurgitation is not visualized. Mild to moderate aortic valve sclerosis/calcification is present, without any evidence of aortic stenosis. Pulmonic Valve: The pulmonic valve was normal in structure. Pulmonic valve  regurgitation is not visualized. No evidence of pulmonic stenosis. Aorta: Aortic dilatation noted. There is mild dilatation of the aortic root, measuring 39 mm. Venous: The inferior vena cava is normal in size with greater than 50% respiratory variability, suggesting right atrial pressure of 3 mmHg. IAS/Shunts: No atrial level shunt detected by color flow Doppler.  LEFT VENTRICLE PLAX 2D LVIDd:         5.30 cm   Diastology LVIDs:         3.90 cm   LV e' medial:    4.05 cm/s LV PW:         0.90 cm   LV  E/e' medial:  14.6 LV IVS:        0.90 cm   LV e' lateral:   6.30 cm/s LVOT diam:     2.20 cm   LV E/e' lateral: 9.4 LV SV:         65 LV SV Index:   27 LVOT Area:     3.80 cm  RIGHT VENTRICLE RV S prime:     13.90 cm/s TAPSE (M-mode): 2.3 cm LEFT ATRIUM             Index        RIGHT ATRIUM           Index LA diam:        3.70 cm 1.54 cm/m   RA Area:     18.80 cm LA Vol (A2C):   71.9 ml 29.94 ml/m  RA Volume:   52.40 ml  21.82 ml/m LA Vol (A4C):   76.6 ml 31.89 ml/m LA Biplane Vol: 75.2 ml 31.31 ml/m  AORTIC VALVE LVOT Vmax:   93.40 cm/s LVOT Vmean:  60.800 cm/s LVOT VTI:    0.171 m  AORTA Ao Root diam: 3.90 cm MITRAL VALVE               TRICUSPID VALVE MV Area (PHT): 2.00 cm    TR Peak grad:   19.4 mmHg MV Decel Time: 380 msec    TR Vmax:        220.00 cm/s MV E velocity: 59.10 cm/s MV A velocity: 99.80 cm/s  SHUNTS MV E/A ratio:  0.59        Systemic VTI:  0.17 m                            Systemic Diam: 2.20 cm Jenkins Rouge MD Electronically signed by Jenkins Rouge MD Signature Date/Time: 06/25/2021/2:09:33 PM    Final       Subjective: No chest pain or shortness of breath  Discharge Exam: Vitals:   06/25/21 0539 06/25/21 0740 06/25/21 1210 06/25/21 1604  BP: (!) 169/99 (!) 134/96 (!) 142/93 (!) 143/99  Pulse: 73 82 80 73  Resp:  19 18 17   Temp:  98.8 F (37.1 C) 98.8 F (37.1 C) 97.9 F (36.6 C)  TempSrc:  Oral Oral Oral  SpO2:  97% 95% 97%  Weight:      Height:        General: Pt is  alert, awake, not in acute distress Cardiovascular: RRR, S1/S2 +, no rubs, no gallops Respiratory: CTA bilaterally, no wheezing, no rhonchi Abdominal: Soft, NT, ND, bowel sounds + Extremities: no edema, no cyanosis    The results of significant diagnostics from this hospitalization (including imaging, microbiology, ancillary and laboratory) are listed below for reference.     Microbiology: Recent Results (from the past 240 hour(s))  Resp Panel by RT-PCR (Flu A&B, Covid) Nasopharyngeal Swab     Status: None   Collection Time: 06/25/21 12:12 AM   Specimen: Nasopharyngeal Swab; Nasopharyngeal(NP) swabs in vial transport medium  Result Value Ref Range Status   SARS Coronavirus 2 by RT PCR NEGATIVE NEGATIVE Final    Comment: (NOTE) SARS-CoV-2 target nucleic acids are NOT DETECTED.  The SARS-CoV-2 RNA is generally detectable in upper respiratory specimens during the acute phase of infection. The lowest concentration of SARS-CoV-2 viral copies this assay can detect is 138 copies/mL. A negative result does not preclude SARS-Cov-2 infection and should not be used as  the sole basis for treatment or other patient management decisions. A negative result may occur with  improper specimen collection/handling, submission of specimen other than nasopharyngeal swab, presence of viral mutation(s) within the areas targeted by this assay, and inadequate number of viral copies(<138 copies/mL). A negative result must be combined with clinical observations, patient history, and epidemiological information. The expected result is Negative.  Fact Sheet for Patients:  EntrepreneurPulse.com.au  Fact Sheet for Healthcare Providers:  IncredibleEmployment.be  This test is no t yet approved or cleared by the [place] FDA and  has been authorized for detection and/or diagnosis of SARS-CoV-2 by FDA under an Emergency Use Authorization (EUA). This EUA will remain  in  effect (meaning this test can be used) for the duration of the COVID-19 declaration under Section 564(b)(1) of the Act, 21 U.S.C.section 360bbb-3(b)(1), unless the authorization is terminated  or revoked sooner.       Influenza A by PCR NEGATIVE NEGATIVE Final   Influenza B by PCR NEGATIVE NEGATIVE Final    Comment: (NOTE) The Xpert Xpress SARS-CoV-2/FLU/RSV plus assay is intended as an aid in the diagnosis of influenza from Nasopharyngeal swab specimens and should not be used as a sole basis for treatment. Nasal washings and aspirates are unacceptable for Xpert Xpress SARS-CoV-2/FLU/RSV testing.  Fact Sheet for Patients: EntrepreneurPulse.com.au  Fact Sheet for Healthcare Providers: IncredibleEmployment.be  This test is not yet approved or cleared by the [place] FDA and has been authorized for detection and/or diagnosis of SARS-CoV-2 by FDA under an Emergency Use Authorization (EUA). This EUA will remain in effect (meaning this test can be used) for the duration of the COVID-19 declaration under Section 564(b)(1) of the Act, 21 U.S.C. section 360bbb-3(b)(1), unless the authorization is terminated or revoked.  Performed at [facility], [address] [place], [place] [address]      Labs: BNP (last 3 results) No results for input(s): BNP in the last 8760 hours. Basic Metabolic Panel: Recent Labs  Lab 06/24/21 2310 06/25/21 0547  NA 138 141  K 4.1 4.0  CL 102 105  CO2 29 28  GLUCOSE 151* 135*  BUN 24* 23*  CREATININE 1.77* 1.65*  CALCIUM 8.7* 9.1  MG  --  1.8   Liver Function Tests: No results for input(s): AST, ALT, ALKPHOS, BILITOT, PROT, ALBUMIN in the last 168 hours. No results for input(s): LIPASE, AMYLASE in the last 168 hours. No results for input(s): AMMONIA in the last 168 hours. CBC: Recent Labs  Lab 06/24/21 2310  WBC 6.3  NEUTROABS 3.8  HGB 14.6  HCT 42.1  MCV 89.6  PLT 193   Cardiac  Enzymes: No results for input(s): CKTOTAL, CKMB, CKMBINDEX, TROPONINI in the last 168 hours. BNP: Invalid input(s): POCBNP CBG: Recent Labs  Lab 06/25/21 0300 06/25/21 0548 06/25/21 0738 06/25/21 1121 06/25/21 1558  GLUCAP 100* 131* 115* 122* 106*   D-Dimer No results for input(s): DDIMER in the last 72 hours. Hgb A1c Recent Labs    06/25/21 0547  HGBA1C 6.8*   Lipid Profile Recent Labs    06/25/21 0547  CHOL 239*  HDL 40*  LDLCALC 185*  TRIG 68  CHOLHDL 6.0   Thyroid function studies Recent Labs    06/25/21 0547  TSH 2.262   Anemia work up No results for input(s): VITAMINB12, FOLATE, FERRITIN, TIBC, IRON, RETICCTPCT in the last 72 hours. Urinalysis No results found for: COLORURINE, APPEARANCEUR, LABSPEC, PHURINE, GLUCOSEU, HGBUR, BILIRUBINUR, KETONESUR, PROTEINUR, UROBILINOGEN, NITRITE, LEUKOCYTESUR Sepsis  Labs Invalid input(s): PROCALCITONIN,  WBC,  LACTICIDVEN Microbiology Recent Results (from the past 240 hour(s))  Resp Panel by RT-PCR (Flu A&B, Covid) Nasopharyngeal Swab     Status: None   Collection Time: 06/25/21 12:12 AM   Specimen: Nasopharyngeal Swab; Nasopharyngeal(NP) swabs in vial transport medium  Result Value Ref Range Status   SARS Coronavirus 2 by RT PCR NEGATIVE NEGATIVE Final    Comment: (NOTE) SARS-CoV-2 target nucleic acids are NOT DETECTED.  The SARS-CoV-2 RNA is generally detectable in upper respiratory specimens during the acute phase of infection. The lowest concentration of SARS-CoV-2 viral copies this assay can detect is 138 copies/mL. A negative result does not preclude SARS-Cov-2 infection and should not be used as the sole basis for treatment or other patient management decisions. A negative result may occur with  improper specimen collection/handling, submission of specimen other than nasopharyngeal swab, presence of viral mutation(s) within the areas targeted by this assay, and inadequate number of viral copies(<138  copies/mL). A negative result must be combined with clinical observations, patient history, and epidemiological information. The expected result is Negative.  Fact Sheet for Patients:  EntrepreneurPulse.com.au  Fact Sheet for Healthcare Providers:  IncredibleEmployment.be  This test is no t yet approved or cleared by the [place] FDA and  has been authorized for detection and/or diagnosis of SARS-CoV-2 by FDA under an Emergency Use Authorization (EUA). This EUA will remain  in effect (meaning this test can be used) for the duration of the COVID-19 declaration under Section 564(b)(1) of the Act, 21 U.S.C.section 360bbb-3(b)(1), unless the authorization is terminated  or revoked sooner.       Influenza A by PCR NEGATIVE NEGATIVE Final   Influenza B by PCR NEGATIVE NEGATIVE Final    Comment: (NOTE) The Xpert Xpress SARS-CoV-2/FLU/RSV plus assay is intended as an aid in the diagnosis of influenza from Nasopharyngeal swab specimens and should not be used as a sole basis for treatment. Nasal washings and aspirates are unacceptable for Xpert Xpress SARS-CoV-2/FLU/RSV testing.  Fact Sheet for Patients: EntrepreneurPulse.com.au  Fact Sheet for Healthcare Providers: IncredibleEmployment.be  This test is not yet approved or cleared by the [place] FDA and has been authorized for detection and/or diagnosis of SARS-CoV-2 by FDA under an Emergency Use Authorization (EUA). This EUA will remain in effect (meaning this test can be used) for the duration of the COVID-19 declaration under Section 564(b)(1) of the Act, 21 U.S.C. section 360bbb-3(b)(1), unless the authorization is terminated or revoked.  Performed at [facility], [address] [place], [place] [address]      Time coordinating discharge: 64mins  SIGNED:   Kathie Dike, MD  [facility] 06/25/2021, 9:14 PM   If  7PM-7AM, please contact night-coverage [email or web address]
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
## 2021-06-25 NOTE — Progress Notes (Signed)
  Echocardiogram 2D Echocardiogram has been performed.  Fidel Levy 06/25/2021, 2:03 PM

## 2021-06-25 NOTE — Progress Notes (Signed)
ANTICOAGULATION CONSULT NOTE - Initial Consult  Pharmacy Consult for Heparin  Indication: atrial fibrillation  No Known Allergies  Patient Measurements: Height: 6\' 2"  (188 cm) Weight: 114.8 kg (253 lb) IBW/kg (Calculated) : 82.2  Vital Signs: Temp: 99.1 F (37.3 C) (10/12 0414) Temp Source: Oral (10/12 0414) BP: 179/112 (10/12 0414) Pulse Rate: 75 (10/12 0414)  Labs: Recent Labs    06/24/21 2310 06/25/21 0300  HGB 14.6  --   HCT 42.1  --   PLT 193  --   CREATININE 1.77*  --   TROPONINIHS 38* 44*    Estimated Creatinine Clearance: 62.7 mL/min (A) (by C-G formula based on SCr of 1.77 mg/dL (H)).   Medical History: Past Medical History:  Diagnosis Date   Anxiety    Depression    Diabetic neuropathy ([facility])    Diabetic retinopathy ([place])    visual impairment   Dysrhythmia    "palpatations sometimes" (06/30/2017)   Family history of adverse reaction to anesthesia    sister had OR 2016; "couldn't wake up; could hear what they were saying but couldn't get their attention; like I was paralyzed but fully awake" (06/30/2017)   Pneumonia X 2   SVT (supraventricular tachycardia) ([place])    Toe ulcer ([place]) 06/30/2017   2nd digit   Type II diabetes mellitus ([place])     Assessment: 55 y/o transfer from [facility] ED atrial fibrillation. Has been on apixaban in the past but is currently NOT taking. Starting heparin. CBC good. Noted renal dysfunction.   Goal of Therapy:  Heparin level 0.3-0.7 units/ml Monitor platelets by anticoagulation protocol: Yes   Plan:  Heparin 6000 units BOLUS Start heparin drip at 1400 units/hr 1300 Heparin level Daily CBC/Heparin level Monitor for bleeding  Narda Bonds, PharmD, BCPS Clinical Pharmacist Phone: [phone]

## 2021-06-25 NOTE — Progress Notes (Unsigned)
Enrolled patient for a 14 day Zio XT monitor to be mailed to patients home  Dr. Chiloquin to read 

## 2021-06-26 ENCOUNTER — Telehealth: Payer: Self-pay | Admitting: Cardiovascular Disease

## 2021-06-26 NOTE — Telephone Encounter (Signed)
New Message:     Patient said he was discharged yesterday. He says he would like to know what he is able to do and what are his restrictions? He had an appointment already scheduled for 06-30-21 with Dr Oval Linsey before he went to the ER. His instructions said  appointment with Dr Oval Linsey on 06-30-21, so does he still need to see her?

## 2021-06-26 NOTE — Telephone Encounter (Signed)
Called pt to let him know he is to follow up on Monday with Dr. Oval Linsey on and per his discharge instructions he is to have a low sodium heart healthy diet and increase activity slowly. He asked about blood pressure cuffs, he was given recommendations and asked to record his blood pressures and bring them to his next appointment. He verbalized understanding and thanked me for calling him.

## 2021-06-27 ENCOUNTER — Telehealth (HOSPITAL_BASED_OUTPATIENT_CLINIC_OR_DEPARTMENT_OTHER): Payer: Self-pay | Admitting: Cardiovascular Disease

## 2021-06-27 NOTE — Telephone Encounter (Signed)
Called patient to confirm his appointment with Dr. Oval Linsey on Monday 06/30/21.  He would like to speak with someone regarding his medications.
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
## 2021-06-27 NOTE — Telephone Encounter (Signed)
Spoke with pt and noted yesterday around 3:00 pm lasting till about 8:00 pm having some dizziness B/P at that time was 155/95 Per pt feels better today Pt discussed this with PCP and was instructed to see about changing Amlodipine as this may be the culprit and this was why pt called Encouraged pt to allow a few more days to see if this doesn't improve Pt has appt on 06/30/21 with Dr Oval Linsey Will forward to Dr Oval Linsey for review and recommendations ./cy

## 2021-06-30 ENCOUNTER — Encounter (HOSPITAL_BASED_OUTPATIENT_CLINIC_OR_DEPARTMENT_OTHER): Payer: Self-pay | Admitting: Cardiovascular Disease

## 2021-06-30 ENCOUNTER — Ambulatory Visit (INDEPENDENT_AMBULATORY_CARE_PROVIDER_SITE_OTHER): Payer: Medicare HMO | Admitting: Cardiovascular Disease

## 2021-06-30 ENCOUNTER — Other Ambulatory Visit: Payer: Self-pay

## 2021-06-30 VITALS — BP 160/92 | HR 82 | Ht 74.0 in | Wt 255.0 lb

## 2021-06-30 DIAGNOSIS — I471 Supraventricular tachycardia: Secondary | ICD-10-CM

## 2021-06-30 DIAGNOSIS — R0609 Other forms of dyspnea: Secondary | ICD-10-CM | POA: Diagnosis not present

## 2021-06-30 DIAGNOSIS — I10 Essential (primary) hypertension: Secondary | ICD-10-CM | POA: Diagnosis not present

## 2021-06-30 DIAGNOSIS — I4891 Unspecified atrial fibrillation: Secondary | ICD-10-CM | POA: Insufficient documentation

## 2021-06-30 HISTORY — DX: Other forms of dyspnea: R06.09

## 2021-06-30 MED ORDER — CARVEDILOL 12.5 MG PO TABS: 12.5000 mg | ORAL_TABLET | Freq: Two times a day (BID) | ORAL | 3 refills | Status: DC

## 2021-06-30 NOTE — Assessment & Plan Note (Signed)
He has a history of SVT with prior ablation.  There has been question of atrial fibrillation but no clearly demonstrated episodes.  He is very hesitant to start any kind of anticoagulation.  He has a 14-day monitor that he has not yet started wearing.  We will have him wear the monitor for the first 7 days and then start carvedilol 12.5 mg for the second 7 days.  Checking for pheochromocytoma as above.  He wants to know what he can do without medication to control this.  We will have to wait until we have the monitor results to better answer this, but it is unlikely there is any nonpharmacologic intervention other than considering another ablation.

## 2021-06-30 NOTE — Patient Instructions (Addendum)
Medication Instructions:  START CARVEDILOL 12.5 MG TWICE A DAY AFTER YOU HAVE HAD YOUR MONITOR ON FOR 7 DAYS  *If you need a refill on your cardiac medications before your next appointment, please call your pharmacy*  Lab Work: LABS SOON, FIRST [place]  If you have labs (blood work) drawn today and your tests are completely normal, you will receive your results only by: [place] (if you have MyChart) OR A paper copy in the mail If you have any lab test that is abnormal or we need to change your treatment, we will call you to review the results.  Testing/Procedures: Your physician has requested that you have en exercise stress myoview. For further information please visit [email or web address]. Please follow instruction sheet, as given. DO NOT TAKE YOUR CARVEDILOL MORNING OF STRESS MYOVIEW   APPLY YOUR 14 DAY MONITOR THAT YOU CURRENTLY HAVE  AFTER YOU HAVE HAD MONITOR ON FOR 7 DAYS START YOUR CARVEDILOL   Follow-Up: At [facility], you and your health needs are our priority.  As part of our continuing mission to provide you with exceptional heart care, we have created designated Provider Care Teams.  These Care Teams include your primary Cardiologist (physician) and Advanced Practice Providers (APPs -  Physician Assistants and Nurse Practitioners) who all work together to provide you with the care you need, when you need it.  We recommend signing up for the patient portal called "MyChart".  Sign up information is provided on this After Visit Summary.  MyChart is used to connect with patients for Virtual Visits (Telemedicine).  Patients are able to view lab/test results, encounter notes, upcoming appointments, etc.  Non-urgent messages can be sent to your provider as well.   To learn more about what you can do with MyChart, go to [email or web address].    Your next appointment:   6 week(s)  The format for your next appointment:   In Person  Provider:   Laurann Montana, NP  Your physician recommends that you schedule a follow-up appointment in: 3 MONTHS WITH DR [place] Monitor Instructions  Your physician has requested you wear a ZIO patch monitor for 14 days.  This is a single patch monitor. [employer] supplies one patch monitor per enrollment. Additional stickers are not available. Please do not apply patch if you will be having a Nuclear Stress Test,  Echocardiogram, Cardiac CT, MRI, or Chest Xray during the period you would be wearing the  monitor. The patch cannot be worn during these tests. You cannot remove and re-apply the  ZIO XT patch monitor.  Your ZIO patch monitor will be mailed 3 day USPS to your address on file. It may take 3-5 days  to receive your monitor after you have been enrolled.  Once you have received your monitor, please review the enclosed instructions. Your monitor  has already been registered assigning a specific monitor serial # to you.  Billing and Patient Assistance Program Information  We have supplied [employer] with any of your insurance information on file for billing purposes. [employer] offers a sliding scale Patient Assistance Program for patients that do not have  insurance, or whose insurance does not completely cover the cost of the ZIO monitor.  You must apply for the Patient Assistance Program to qualify for this discounted rate.  To apply, please call [employer] at [phone], select option 4, select option 2, ask to apply for  Patient Assistance Program. Theodore Demark will ask your household income,  and how many people  are in your household. They will quote your out-of-pocket cost based on that information.  [employer] will also be able to set up a 33-month, interest-free payment plan if needed.  Applying the monitor   Shave hair from upper left chest.  Hold abrader disc by orange tab. Rub abrader in 40 strokes over the upper left chest as  indicated in your monitor instructions.  Clean area  with 4 enclosed alcohol pads. Let dry.  Apply patch as indicated in monitor instructions. Patch will be placed under collarbone on left  side of chest with arrow pointing upward.  Rub patch adhesive wings for 2 minutes. Remove white label marked "1". Remove the white  label marked "2". Rub patch adhesive wings for 2 additional minutes.  While looking in a mirror, press and release button in center of patch. A small green light will  flash 3-4 times. This will be your only indicator that the monitor has been turned on.  Do not shower for the first 24 hours. You may shower after the first 24 hours.  Press the button if you feel a symptom. You will hear a small click. Record Date, Time and  Symptom in the Patient Logbook.  When you are ready to remove the patch, follow instructions on the last 2 pages of Patient  Logbook. Stick patch monitor onto the last page of Patient Logbook.  Place Patient Logbook in the blue and white box. Use locking tab on box and tape box closed  securely. The blue and white box has prepaid postage on it. Please place it in the mailbox as  soon as possible. Your physician should have your test results approximately 7 days after the  monitor has been mailed back to [facility].  Call [place] at [phone] if you have questions regarding  your ZIO XT patch monitor. Call them immediately if you see an orange light blinking on your  monitor.  If your monitor falls off in less than 4 days, contact our Monitor department at [phone].  If your monitor becomes loose or falls off after 4 days call [employer] at [phone] for  suggestions on securing your monitor

## 2021-06-30 NOTE — Assessment & Plan Note (Signed)
He reports getting very short of breath and having palpitations when climbing hanging rock.  He notes that he does not get much exercise at baseline.  He is worried that this might be a sign of blockage in his heart arteries.  We will get an exercise Myoview to assess for ischemia.

## 2021-06-30 NOTE — Assessment & Plan Note (Signed)
Blood pressure remains elevated.  He seems to be tolerating amlodipine now, though he did not tolerate his blood pressure dropping quickly after being discharged from the hospital.  We will continue both amlodipine and ramipril.  We will also add carvedilol 12.5 mg twice daily after he has been wearing his monitor for about a week.  Since that his blood pressure increased rapidly to the over the 200s and he has had more palpitations.  We will check catecholamines and metanephrines to evaluate for pheochromocytoma as well.

## 2021-07-01 ENCOUNTER — Telehealth: Payer: Self-pay | Admitting: Cardiovascular Disease

## 2021-07-01 ENCOUNTER — Telehealth (HOSPITAL_COMMUNITY): Payer: Self-pay | Admitting: *Deleted

## 2021-07-01 NOTE — Telephone Encounter (Signed)
Discussed with Dr Oval Linsey and will have patient increase his Ramipril to 20 mg daily  Reviewed recommendations with patient and he stated he had taken in this dose in past by mistake and had dizziness  Dr Forde Dandy decrease him back to Ramipril 10 mg daily When asked the patient if he had low blood pressure with dizziness he was unsure but willing to try the increased dose Discussed with Dr Oval Linsey and will have him try increased dose but call back if develops dizziness   Advised patient, verbalized understanding

## 2021-07-01 NOTE — Telephone Encounter (Signed)
Pt c/o BP issue: STAT if pt c/o blurred vision, one-sided weakness or slurred speech  1. What are your last 5 BP readings? 178/113  2. Are you having any other symptoms (ex. Dizziness, headache, blurred vision, passed out)? Had a trace of a headache, but it went away in 15 seconds.   3. What is your BP issue? Patient states his BP is high. He says he was in the ED and spoke with Dr. Forde Dandy about his medications. He says he took his BP 3 hrs ago and it was 178/113. He says he spoke with Dr. Forde Dandy again and was told to call the office. He says he will take another BP reading before a nurse calls him back.

## 2021-07-01 NOTE — Telephone Encounter (Addendum)
Patient seen by Dr Antonieta Loveless 10/17

## 2021-07-01 NOTE — Telephone Encounter (Signed)
Patient given detailed instructions per Myocardial Perfusion Study Information Sheet for the test on 07/07/21 at 10:45. Patient notified to arrive 15 minutes early and that it is imperative to arrive on time for appointment to keep from having the test rescheduled.  If you need to cancel or reschedule your appointment, please call the office within 24 hours of your appointment. . Patient verbalized understanding.Billy [name]

## 2021-07-01 NOTE — Telephone Encounter (Signed)
Spoke to patient he stated his B/P still elevated at present 169/96 pulse 87.He spoke to Dr.South's assistant and he advised to call Dr.Pea Ridge and have her re exam B/P medications.Advised I will send message to [place].

## 2021-07-02 DIAGNOSIS — I10 Essential (primary) hypertension: Secondary | ICD-10-CM | POA: Diagnosis not present

## 2021-07-07 ENCOUNTER — Other Ambulatory Visit: Payer: Self-pay

## 2021-07-07 ENCOUNTER — Ambulatory Visit (HOSPITAL_COMMUNITY): Admit: 2021-07-07 | Payer: Medicare HMO | Admitting: Cardiovascular Disease

## 2021-07-07 ENCOUNTER — Encounter (HOSPITAL_COMMUNITY): Payer: Self-pay

## 2021-07-07 ENCOUNTER — Telehealth: Payer: Self-pay | Admitting: Interventional Cardiology

## 2021-07-07 ENCOUNTER — Emergency Department (HOSPITAL_COMMUNITY): Payer: Medicare HMO

## 2021-07-07 ENCOUNTER — Inpatient Hospital Stay (HOSPITAL_COMMUNITY)
Admission: EM | Disposition: A | Discharge status: Home / Self Care | Payer: Self-pay | Source: Home / Self Care | Attending: Thoracic Surgery (Cardiothoracic Vascular Surgery)

## 2021-07-07 ENCOUNTER — Encounter (HOSPITAL_COMMUNITY)
Admission: EM | Disposition: A | Discharge status: Home / Self Care | Payer: Self-pay | Source: Home / Self Care | Attending: Thoracic Surgery (Cardiothoracic Vascular Surgery)

## 2021-07-07 ENCOUNTER — Inpatient Hospital Stay (HOSPITAL_COMMUNITY)
Admission: EM | Admit: 2021-07-07 | Discharge: 2021-08-06 | DRG: 231 | Disposition: A | Discharge status: Home Health | Payer: Medicare HMO | Source: Ambulatory Visit | Attending: Thoracic Surgery (Cardiothoracic Vascular Surgery) | Admitting: Thoracic Surgery (Cardiothoracic Vascular Surgery)

## 2021-07-07 ENCOUNTER — Ambulatory Visit (HOSPITAL_BASED_OUTPATIENT_CLINIC_OR_DEPARTMENT_OTHER): Payer: Medicare HMO

## 2021-07-07 DIAGNOSIS — J9811 Atelectasis: Secondary | ICD-10-CM | POA: Diagnosis not present

## 2021-07-07 DIAGNOSIS — Z6834 Body mass index (BMI) 34.0-34.9, adult: Secondary | ICD-10-CM | POA: Diagnosis not present

## 2021-07-07 DIAGNOSIS — J939 Pneumothorax, unspecified: Secondary | ICD-10-CM

## 2021-07-07 DIAGNOSIS — R0789 Other chest pain: Secondary | ICD-10-CM | POA: Diagnosis not present

## 2021-07-07 DIAGNOSIS — I255 Ischemic cardiomyopathy: Secondary | ICD-10-CM | POA: Diagnosis present

## 2021-07-07 DIAGNOSIS — R570 Cardiogenic shock: Secondary | ICD-10-CM | POA: Diagnosis not present

## 2021-07-07 DIAGNOSIS — J189 Pneumonia, unspecified organism: Secondary | ICD-10-CM | POA: Diagnosis not present

## 2021-07-07 DIAGNOSIS — D696 Thrombocytopenia, unspecified: Secondary | ICD-10-CM | POA: Diagnosis not present

## 2021-07-07 DIAGNOSIS — T884XXA Failed or difficult intubation, initial encounter: Secondary | ICD-10-CM

## 2021-07-07 DIAGNOSIS — R0902 Hypoxemia: Secondary | ICD-10-CM

## 2021-07-07 DIAGNOSIS — I499 Cardiac arrhythmia, unspecified: Secondary | ICD-10-CM | POA: Diagnosis not present

## 2021-07-07 DIAGNOSIS — E8779 Other fluid overload: Secondary | ICD-10-CM | POA: Diagnosis not present

## 2021-07-07 DIAGNOSIS — J810 Acute pulmonary edema: Secondary | ICD-10-CM | POA: Diagnosis not present

## 2021-07-07 DIAGNOSIS — E669 Obesity, unspecified: Secondary | ICD-10-CM | POA: Diagnosis present

## 2021-07-07 DIAGNOSIS — I214 Non-ST elevation (NSTEMI) myocardial infarction: Secondary | ICD-10-CM | POA: Diagnosis not present

## 2021-07-07 DIAGNOSIS — N1832 Chronic kidney disease, stage 3b: Secondary | ICD-10-CM | POA: Diagnosis not present

## 2021-07-07 DIAGNOSIS — I2102 ST elevation (STEMI) myocardial infarction involving left anterior descending coronary artery: Secondary | ICD-10-CM

## 2021-07-07 DIAGNOSIS — K3189 Other diseases of stomach and duodenum: Secondary | ICD-10-CM | POA: Diagnosis not present

## 2021-07-07 DIAGNOSIS — E1122 Type 2 diabetes mellitus with diabetic chronic kidney disease: Secondary | ICD-10-CM | POA: Diagnosis present

## 2021-07-07 DIAGNOSIS — J90 Pleural effusion, not elsewhere classified: Secondary | ICD-10-CM | POA: Diagnosis not present

## 2021-07-07 DIAGNOSIS — Z20822 Contact with and (suspected) exposure to covid-19: Secondary | ICD-10-CM | POA: Diagnosis not present

## 2021-07-07 DIAGNOSIS — I3139 Other pericardial effusion (noninflammatory): Secondary | ICD-10-CM | POA: Diagnosis not present

## 2021-07-07 DIAGNOSIS — Z978 Presence of other specified devices: Secondary | ICD-10-CM

## 2021-07-07 DIAGNOSIS — Z452 Encounter for adjustment and management of vascular access device: Secondary | ICD-10-CM | POA: Diagnosis not present

## 2021-07-07 DIAGNOSIS — T508X5A Adverse effect of diagnostic agents, initial encounter: Secondary | ICD-10-CM | POA: Diagnosis not present

## 2021-07-07 DIAGNOSIS — K567 Ileus, unspecified: Secondary | ICD-10-CM | POA: Diagnosis not present

## 2021-07-07 DIAGNOSIS — N179 Acute kidney failure, unspecified: Secondary | ICD-10-CM | POA: Diagnosis not present

## 2021-07-07 DIAGNOSIS — R0609 Other forms of dyspnea: Secondary | ICD-10-CM

## 2021-07-07 DIAGNOSIS — I160 Hypertensive urgency: Secondary | ICD-10-CM | POA: Diagnosis not present

## 2021-07-07 DIAGNOSIS — R069 Unspecified abnormalities of breathing: Secondary | ICD-10-CM

## 2021-07-07 DIAGNOSIS — Z9689 Presence of other specified functional implants: Secondary | ICD-10-CM

## 2021-07-07 DIAGNOSIS — I517 Cardiomegaly: Secondary | ICD-10-CM | POA: Diagnosis not present

## 2021-07-07 DIAGNOSIS — R531 Weakness: Secondary | ICD-10-CM | POA: Diagnosis not present

## 2021-07-07 DIAGNOSIS — I4891 Unspecified atrial fibrillation: Secondary | ICD-10-CM

## 2021-07-07 DIAGNOSIS — E1165 Type 2 diabetes mellitus with hyperglycemia: Secondary | ICD-10-CM | POA: Diagnosis present

## 2021-07-07 DIAGNOSIS — Z9889 Other specified postprocedural states: Secondary | ICD-10-CM

## 2021-07-07 DIAGNOSIS — Z794 Long term (current) use of insulin: Secondary | ICD-10-CM

## 2021-07-07 DIAGNOSIS — K6389 Other specified diseases of intestine: Secondary | ICD-10-CM | POA: Diagnosis not present

## 2021-07-07 DIAGNOSIS — R339 Retention of urine, unspecified: Secondary | ICD-10-CM | POA: Diagnosis not present

## 2021-07-07 DIAGNOSIS — Z800 Family history of malignant neoplasm of digestive organs: Secondary | ICD-10-CM

## 2021-07-07 DIAGNOSIS — I10 Essential (primary) hypertension: Secondary | ICD-10-CM | POA: Diagnosis not present

## 2021-07-07 DIAGNOSIS — D509 Iron deficiency anemia, unspecified: Secondary | ICD-10-CM | POA: Diagnosis present

## 2021-07-07 DIAGNOSIS — I213 ST elevation (STEMI) myocardial infarction of unspecified site: Secondary | ICD-10-CM | POA: Diagnosis not present

## 2021-07-07 DIAGNOSIS — I639 Cerebral infarction, unspecified: Secondary | ICD-10-CM | POA: Diagnosis not present

## 2021-07-07 DIAGNOSIS — D62 Acute posthemorrhagic anemia: Secondary | ICD-10-CM | POA: Diagnosis not present

## 2021-07-07 DIAGNOSIS — J9600 Acute respiratory failure, unspecified whether with hypoxia or hypercapnia: Secondary | ICD-10-CM

## 2021-07-07 DIAGNOSIS — I2584 Coronary atherosclerosis due to calcified coronary lesion: Secondary | ICD-10-CM | POA: Diagnosis present

## 2021-07-07 DIAGNOSIS — E1129 Type 2 diabetes mellitus with other diabetic kidney complication: Secondary | ICD-10-CM | POA: Diagnosis not present

## 2021-07-07 DIAGNOSIS — J80 Acute respiratory distress syndrome: Secondary | ICD-10-CM

## 2021-07-07 DIAGNOSIS — Z4901 Encounter for fitting and adjustment of extracorporeal dialysis catheter: Secondary | ICD-10-CM | POA: Diagnosis not present

## 2021-07-07 DIAGNOSIS — F1290 Cannabis use, unspecified, uncomplicated: Secondary | ICD-10-CM | POA: Diagnosis present

## 2021-07-07 DIAGNOSIS — E872 Acidosis, unspecified: Secondary | ICD-10-CM | POA: Diagnosis not present

## 2021-07-07 DIAGNOSIS — Z7984 Long term (current) use of oral hypoglycemic drugs: Secondary | ICD-10-CM

## 2021-07-07 DIAGNOSIS — I5021 Acute systolic (congestive) heart failure: Secondary | ICD-10-CM | POA: Diagnosis not present

## 2021-07-07 DIAGNOSIS — I088 Other rheumatic multiple valve diseases: Secondary | ICD-10-CM | POA: Diagnosis not present

## 2021-07-07 DIAGNOSIS — Z951 Presence of aortocoronary bypass graft: Secondary | ICD-10-CM | POA: Diagnosis not present

## 2021-07-07 DIAGNOSIS — E1140 Type 2 diabetes mellitus with diabetic neuropathy, unspecified: Secondary | ICD-10-CM | POA: Diagnosis present

## 2021-07-07 DIAGNOSIS — L8990 Pressure ulcer of unspecified site, unspecified stage: Secondary | ICD-10-CM | POA: Insufficient documentation

## 2021-07-07 DIAGNOSIS — I5043 Acute on chronic combined systolic (congestive) and diastolic (congestive) heart failure: Secondary | ICD-10-CM | POA: Diagnosis not present

## 2021-07-07 DIAGNOSIS — Y92239 Unspecified place in hospital as the place of occurrence of the external cause: Secondary | ICD-10-CM | POA: Diagnosis not present

## 2021-07-07 DIAGNOSIS — I428 Other cardiomyopathies: Secondary | ICD-10-CM | POA: Diagnosis not present

## 2021-07-07 DIAGNOSIS — I2510 Atherosclerotic heart disease of native coronary artery without angina pectoris: Secondary | ICD-10-CM

## 2021-07-07 DIAGNOSIS — I130 Hypertensive heart and chronic kidney disease with heart failure and stage 1 through stage 4 chronic kidney disease, or unspecified chronic kidney disease: Secondary | ICD-10-CM | POA: Diagnosis present

## 2021-07-07 DIAGNOSIS — N1411 Contrast-induced nephropathy: Secondary | ICD-10-CM | POA: Diagnosis not present

## 2021-07-07 DIAGNOSIS — Z4659 Encounter for fitting and adjustment of other gastrointestinal appliance and device: Secondary | ICD-10-CM

## 2021-07-07 DIAGNOSIS — I25110 Atherosclerotic heart disease of native coronary artery with unstable angina pectoris: Secondary | ICD-10-CM | POA: Diagnosis not present

## 2021-07-07 DIAGNOSIS — Z8249 Family history of ischemic heart disease and other diseases of the circulatory system: Secondary | ICD-10-CM

## 2021-07-07 DIAGNOSIS — I4819 Other persistent atrial fibrillation: Secondary | ICD-10-CM | POA: Diagnosis present

## 2021-07-07 DIAGNOSIS — J9601 Acute respiratory failure with hypoxia: Secondary | ICD-10-CM | POA: Diagnosis not present

## 2021-07-07 DIAGNOSIS — Z9911 Dependence on respirator [ventilator] status: Secondary | ICD-10-CM | POA: Diagnosis not present

## 2021-07-07 DIAGNOSIS — N170 Acute kidney failure with tubular necrosis: Secondary | ICD-10-CM | POA: Diagnosis not present

## 2021-07-07 DIAGNOSIS — Z7902 Long term (current) use of antithrombotics/antiplatelets: Secondary | ICD-10-CM

## 2021-07-07 DIAGNOSIS — I2109 ST elevation (STEMI) myocardial infarction involving other coronary artery of anterior wall: Principal | ICD-10-CM | POA: Diagnosis present

## 2021-07-07 DIAGNOSIS — Z781 Physical restraint status: Secondary | ICD-10-CM

## 2021-07-07 DIAGNOSIS — Z833 Family history of diabetes mellitus: Secondary | ICD-10-CM

## 2021-07-07 DIAGNOSIS — Z823 Family history of stroke: Secondary | ICD-10-CM

## 2021-07-07 DIAGNOSIS — F32A Depression, unspecified: Secondary | ICD-10-CM | POA: Diagnosis present

## 2021-07-07 DIAGNOSIS — H547 Unspecified visual loss: Secondary | ICD-10-CM | POA: Diagnosis present

## 2021-07-07 DIAGNOSIS — F419 Anxiety disorder, unspecified: Secondary | ICD-10-CM | POA: Diagnosis present

## 2021-07-07 DIAGNOSIS — G9341 Metabolic encephalopathy: Secondary | ICD-10-CM | POA: Diagnosis not present

## 2021-07-07 DIAGNOSIS — G4730 Sleep apnea, unspecified: Secondary | ICD-10-CM | POA: Diagnosis present

## 2021-07-07 DIAGNOSIS — Z7901 Long term (current) use of anticoagulants: Secondary | ICD-10-CM

## 2021-07-07 DIAGNOSIS — I4811 Longstanding persistent atrial fibrillation: Secondary | ICD-10-CM | POA: Diagnosis not present

## 2021-07-07 DIAGNOSIS — I129 Hypertensive chronic kidney disease with stage 1 through stage 4 chronic kidney disease, or unspecified chronic kidney disease: Secondary | ICD-10-CM | POA: Diagnosis not present

## 2021-07-07 DIAGNOSIS — R34 Anuria and oliguria: Secondary | ICD-10-CM | POA: Diagnosis not present

## 2021-07-07 DIAGNOSIS — R009 Unspecified abnormalities of heart beat: Secondary | ICD-10-CM | POA: Diagnosis not present

## 2021-07-07 DIAGNOSIS — Z743 Need for continuous supervision: Secondary | ICD-10-CM | POA: Diagnosis not present

## 2021-07-07 DIAGNOSIS — Z4682 Encounter for fitting and adjustment of non-vascular catheter: Secondary | ICD-10-CM | POA: Diagnosis not present

## 2021-07-07 DIAGNOSIS — R0602 Shortness of breath: Secondary | ICD-10-CM | POA: Diagnosis not present

## 2021-07-07 DIAGNOSIS — I5022 Chronic systolic (congestive) heart failure: Secondary | ICD-10-CM | POA: Diagnosis not present

## 2021-07-07 DIAGNOSIS — Z89421 Acquired absence of other right toe(s): Secondary | ICD-10-CM

## 2021-07-07 DIAGNOSIS — I493 Ventricular premature depolarization: Secondary | ICD-10-CM | POA: Diagnosis not present

## 2021-07-07 DIAGNOSIS — E871 Hypo-osmolality and hyponatremia: Secondary | ICD-10-CM | POA: Diagnosis not present

## 2021-07-07 DIAGNOSIS — Z79899 Other long term (current) drug therapy: Secondary | ICD-10-CM

## 2021-07-07 DIAGNOSIS — J9691 Respiratory failure, unspecified with hypoxia: Secondary | ICD-10-CM

## 2021-07-07 DIAGNOSIS — T17908A Unspecified foreign body in respiratory tract, part unspecified causing other injury, initial encounter: Secondary | ICD-10-CM

## 2021-07-07 DIAGNOSIS — D649 Anemia, unspecified: Secondary | ICD-10-CM | POA: Diagnosis not present

## 2021-07-07 DIAGNOSIS — J9690 Respiratory failure, unspecified, unspecified whether with hypoxia or hypercapnia: Secondary | ICD-10-CM | POA: Diagnosis not present

## 2021-07-07 DIAGNOSIS — Z09 Encounter for follow-up examination after completed treatment for conditions other than malignant neoplasm: Secondary | ICD-10-CM

## 2021-07-07 HISTORY — PX: LEFT HEART CATH AND CORONARY ANGIOGRAPHY: CATH118249

## 2021-07-07 HISTORY — PX: CORONARY STENT INTERVENTION: CATH118234

## 2021-07-07 LAB — CBC WITH DIFFERENTIAL/PLATELET
Abs Immature Granulocytes: 0.04 10*3/uL (ref 0.00–0.07)
Basophils Absolute: 0 10*3/uL (ref 0.0–0.1)
Basophils Relative: 0 %
Eosinophils Absolute: 0.1 10*3/uL (ref 0.0–0.5)
Eosinophils Relative: 1 %
HCT: 40.6 % (ref 39.0–52.0)
Hemoglobin: 14 g/dL (ref 13.0–17.0)
Immature Granulocytes: 0 %
Lymphocytes Relative: 21 %
Lymphs Abs: 2 10*3/uL (ref 0.7–4.0)
MCH: 32 pg (ref 26.0–34.0)
MCHC: 34.5 g/dL (ref 30.0–36.0)
MCV: 92.7 fL (ref 80.0–100.0)
Monocytes Absolute: 0.8 10*3/uL (ref 0.1–1.0)
Monocytes Relative: 9 %
Neutro Abs: 6.6 10*3/uL (ref 1.7–7.7)
Neutrophils Relative %: 69 %
Platelets: 205 10*3/uL (ref 150–400)
RBC: 4.38 MIL/uL (ref 4.22–5.81)
RDW: 12 % (ref 11.5–15.5)
WBC: 9.6 10*3/uL (ref 4.0–10.5)
nRBC: 0 % (ref 0.0–0.2)

## 2021-07-07 LAB — POCT ACTIVATED CLOTTING TIME: Activated Clotting Time: 451 seconds

## 2021-07-07 LAB — MYOCARDIAL PERFUSION IMAGING
Peak HR: 162 {beats}/min
Rest HR: 104 {beats}/min
Rest Nuclear Isotope Dose: 10.8 mCi
Stress Nuclear Isotope Dose: 30.4 mCi

## 2021-07-07 LAB — RESP PANEL BY RT-PCR (FLU A&B, COVID) ARPGX2
Influenza A by PCR: NEGATIVE
Influenza B by PCR: NEGATIVE
SARS Coronavirus 2 by RT PCR: NEGATIVE

## 2021-07-07 LAB — COMPREHENSIVE METABOLIC PANEL
ALT: 24 U/L (ref 0–44)
AST: 113 U/L — ABNORMAL HIGH (ref 15–41)
Albumin: 3.3 g/dL — ABNORMAL LOW (ref 3.5–5.0)
Alkaline Phosphatase: 38 U/L (ref 38–126)
Anion gap: 12 (ref 5–15)
BUN: 20 mg/dL (ref 6–20)
CO2: 20 mmol/L — ABNORMAL LOW (ref 22–32)
Calcium: 8.7 mg/dL — ABNORMAL LOW (ref 8.9–10.3)
Chloride: 105 mmol/L (ref 98–111)
Creatinine, Ser: 1.92 mg/dL — ABNORMAL HIGH (ref 0.61–1.24)
GFR, Estimated: 40 mL/min — ABNORMAL LOW (ref >60)
Glucose, Bld: 229 mg/dL — ABNORMAL HIGH (ref 70–99)
Potassium: 4.4 mmol/L (ref 3.5–5.1)
Sodium: 137 mmol/L (ref 135–145)
Total Bilirubin: 1 mg/dL (ref 0.3–1.2)
Total Protein: 6.1 g/dL — ABNORMAL LOW (ref 6.5–8.1)

## 2021-07-07 LAB — PROTIME-INR
INR: 0.9 (ref 0.8–1.2)
Prothrombin Time: 12.6 seconds (ref 11.4–15.2)

## 2021-07-07 LAB — LIPID PANEL
Cholesterol: 208 mg/dL — ABNORMAL HIGH (ref 0–200)
HDL: 35 mg/dL — ABNORMAL LOW (ref >40)
LDL Cholesterol: 131 mg/dL — ABNORMAL HIGH (ref 0–99)
Total CHOL/HDL Ratio: 5.9 RATIO
Triglycerides: 212 mg/dL — ABNORMAL HIGH (ref <150)
VLDL: 42 mg/dL — ABNORMAL HIGH (ref 0–40)

## 2021-07-07 LAB — HEMOGLOBIN A1C
Hgb A1c MFr Bld: 6.7 % — ABNORMAL HIGH (ref 4.8–5.6)
Mean Plasma Glucose: 145.59 mg/dL

## 2021-07-07 LAB — METANEPHRINES, PLASMA
Metanephrine, Free: 40.1 pg/mL (ref 0.0–88.0)
Normetanephrine, Free: 76.9 pg/mL (ref 0.0–244.0)

## 2021-07-07 LAB — TROPONIN I (HIGH SENSITIVITY)
Troponin I (High Sensitivity): 22997 ng/L (ref <18)
Troponin I (High Sensitivity): 20631 ng/L (ref <18)

## 2021-07-07 LAB — CATECHOLAMINES, FRACTIONATED, PLASMA
Dopamine: 66 pg/mL — ABNORMAL HIGH (ref 0–48)
Epinephrine: <15 pg/mL (ref 0–62)
Norepinephrine: 744 pg/mL (ref 0–874)

## 2021-07-07 LAB — GLUCOSE, CAPILLARY: Glucose-Capillary: 204 mg/dL — ABNORMAL HIGH (ref 70–99)

## 2021-07-07 LAB — APTT: aPTT: 24 seconds (ref 24–36)

## 2021-07-07 SURGERY — LEFT HEART CATH AND CORONARY ANGIOGRAPHY
Anesthesia: LOCAL

## 2021-07-07 MED ORDER — TIROFIBAN HCL IV 12.5 MG/250 ML
INTRAVENOUS | Status: AC | PRN
  Administered 2021-07-07: .075 ug/kg/min via INTRAVENOUS

## 2021-07-07 MED ORDER — ONDANSETRON HCL 4 MG/2ML IJ SOLN: 4.0000 mg | Freq: Four times a day (QID) | INTRAMUSCULAR | Status: DC | PRN

## 2021-07-07 MED ORDER — METOPROLOL TARTRATE 5 MG/5ML IV SOLN
5.0000 mg | Freq: Once | INTRAVENOUS | Status: AC
  Administered 2021-07-07: 5 mg via INTRAVENOUS
  Filled 2021-07-07: qty 5

## 2021-07-07 MED ORDER — LIDOCAINE HCL (PF) 1 % IJ SOLN
INTRAMUSCULAR | Status: DC | PRN
  Administered 2021-07-07: 2 mL

## 2021-07-07 MED ORDER — METOPROLOL TARTRATE 5 MG/5ML IV SOLN: 5.0000 mg | Freq: Once | INTRAVENOUS | Status: AC

## 2021-07-07 MED ORDER — LABETALOL HCL 5 MG/ML IV SOLN: 10.0000 mg | INTRAVENOUS | Status: DC | PRN

## 2021-07-07 MED ORDER — MIDAZOLAM HCL 2 MG/2ML IJ SOLN
INTRAMUSCULAR | Status: DC | PRN
  Administered 2021-07-07: 1 mg via INTRAVENOUS

## 2021-07-07 MED ORDER — ICOSAPENT ETHYL 1 G PO CAPS
2.0000 g | ORAL_CAPSULE | Freq: Two times a day (BID) | ORAL | Status: DC
  Administered 2021-07-07: 2 g via ORAL
  Filled 2021-07-07 (×2): qty 2

## 2021-07-07 MED ORDER — METOPROLOL TARTRATE 25 MG PO TABS
25.0000 mg | ORAL_TABLET | Freq: Once | ORAL | Status: AC
  Administered 2021-07-07: 25 mg via ORAL
  Filled 2021-07-07: qty 1

## 2021-07-07 MED ORDER — ASPIRIN 81 MG PO CHEW
81.0000 mg | CHEWABLE_TABLET | Freq: Every day | ORAL | Status: DC
  Administered 2021-07-07: 81 mg via ORAL
  Filled 2021-07-07: qty 1

## 2021-07-07 MED ORDER — HEPARIN SODIUM (PORCINE) 1000 UNIT/ML IJ SOLN
INTRAMUSCULAR | Status: AC
  Filled 2021-07-07: qty 1

## 2021-07-07 MED ORDER — AMLODIPINE BESYLATE 5 MG PO TABS: 5.0000 mg | ORAL_TABLET | Freq: Every day | ORAL | Status: DC

## 2021-07-07 MED ORDER — SODIUM CHLORIDE 0.9 % IV SOLN: 250.0000 mL | INTRAVENOUS | Status: DC | PRN

## 2021-07-07 MED ORDER — REGADENOSON 0.4 MG/5ML IV SOLN
0.4000 mg | Freq: Once | INTRAVENOUS | Status: AC
  Administered 2021-07-07: 0.4 mg via INTRAVENOUS

## 2021-07-07 MED ORDER — TECHNETIUM TC 99M TETROFOSMIN IV KIT
30.4000 | PACK | Freq: Once | INTRAVENOUS | Status: AC | PRN
  Administered 2021-07-07: 30.4 via INTRAVENOUS
  Filled 2021-07-07: qty 31

## 2021-07-07 MED ORDER — TIROFIBAN (AGGRASTAT) BOLUS VIA INFUSION
INTRAVENOUS | Status: DC | PRN
  Administered 2021-07-07: 2892.5 ug via INTRAVENOUS

## 2021-07-07 MED ORDER — TECHNETIUM TC 99M TETROFOSMIN IV KIT
10.8000 | PACK | Freq: Once | INTRAVENOUS | Status: AC | PRN
  Administered 2021-07-07: 10.8 via INTRAVENOUS
  Filled 2021-07-07: qty 11

## 2021-07-07 MED ORDER — FENTANYL CITRATE (PF) 100 MCG/2ML IJ SOLN
INTRAMUSCULAR | Status: AC
  Filled 2021-07-07: qty 2

## 2021-07-07 MED ORDER — GABAPENTIN 400 MG PO CAPS
400.0000 mg | ORAL_CAPSULE | Freq: Every day | ORAL | Status: DC
  Administered 2021-07-07: 400 mg via ORAL
  Filled 2021-07-07: qty 1

## 2021-07-07 MED ORDER — RAMIPRIL 5 MG PO CAPS
20.0000 mg | ORAL_CAPSULE | Freq: Every day | ORAL | Status: DC
  Filled 2021-07-07: qty 4

## 2021-07-07 MED ORDER — TRAZODONE HCL 50 MG PO TABS
100.0000 mg | ORAL_TABLET | Freq: Every day | ORAL | Status: DC
  Filled 2021-07-07: qty 2

## 2021-07-07 MED ORDER — TRAZODONE HCL 50 MG PO TABS
75.0000 mg | ORAL_TABLET | Freq: Every day | ORAL | Status: DC
  Administered 2021-07-07: 75 mg via ORAL

## 2021-07-07 MED ORDER — INSULIN ASPART 100 UNIT/ML IJ SOLN: 0.0000 [IU] | Freq: Three times a day (TID) | INTRAMUSCULAR | Status: DC

## 2021-07-07 MED ORDER — HEPARIN (PORCINE) IN NACL 1000-0.9 UT/500ML-% IV SOLN
INTRAVENOUS | Status: AC
  Filled 2021-07-07: qty 1000

## 2021-07-07 MED ORDER — NITROGLYCERIN 1 MG/10 ML FOR IR/CATH LAB
INTRA_ARTERIAL | Status: DC | PRN
  Administered 2021-07-07: 200 ug

## 2021-07-07 MED ORDER — SODIUM CHLORIDE 0.9% FLUSH
3.0000 mL | Freq: Two times a day (BID) | INTRAVENOUS | Status: DC
  Administered 2021-07-07: 3 mL via INTRAVENOUS

## 2021-07-07 MED ORDER — LIDOCAINE HCL (PF) 1 % IJ SOLN
INTRAMUSCULAR | Status: AC
  Filled 2021-07-07: qty 30

## 2021-07-07 MED ORDER — HEPARIN (PORCINE) IN NACL 1000-0.9 UT/500ML-% IV SOLN
INTRAVENOUS | Status: DC | PRN
  Administered 2021-07-07 (×2): 500 mL

## 2021-07-07 MED ORDER — NITROGLYCERIN 0.4 MG SL SUBL: 0.4000 mg | SUBLINGUAL_TABLET | SUBLINGUAL | Status: DC | PRN

## 2021-07-07 MED ORDER — HEPARIN SODIUM (PORCINE) 1000 UNIT/ML IJ SOLN
INTRAMUSCULAR | Status: DC | PRN
  Administered 2021-07-07: 5000 [IU] via INTRAVENOUS
  Administered 2021-07-07: 7000 [IU] via INTRAVENOUS

## 2021-07-07 MED ORDER — HEPARIN BOLUS VIA INFUSION
4000.0000 [IU] | Freq: Once | INTRAVENOUS | Status: AC
  Administered 2021-07-07: 4000 [IU] via INTRAVENOUS
  Filled 2021-07-07: qty 4000

## 2021-07-07 MED ORDER — FENTANYL CITRATE (PF) 100 MCG/2ML IJ SOLN
INTRAMUSCULAR | Status: DC | PRN
  Administered 2021-07-07: 25 ug via INTRAVENOUS

## 2021-07-07 MED ORDER — ALPRAZOLAM 0.5 MG PO TABS
2.0000 mg | ORAL_TABLET | Freq: Three times a day (TID) | ORAL | Status: DC | PRN
  Administered 2021-07-07: 2 mg via ORAL
  Filled 2021-07-07: qty 4

## 2021-07-07 MED ORDER — SODIUM CHLORIDE 0.9 % IV SOLN: INTRAVENOUS | Status: DC

## 2021-07-07 MED ORDER — SODIUM CHLORIDE 0.9% FLUSH: 3.0000 mL | INTRAVENOUS | Status: DC | PRN

## 2021-07-07 MED ORDER — ATORVASTATIN CALCIUM 80 MG PO TABS
80.0000 mg | ORAL_TABLET | Freq: Every day | ORAL | Status: DC
  Administered 2021-07-07 – 2021-07-13 (×6): 80 mg via ORAL
  Filled 2021-07-07 (×2): qty 1
  Filled 2021-07-07: qty 2
  Filled 2021-07-07 (×3): qty 1

## 2021-07-07 MED ORDER — ASPIRIN 325 MG PO TABS
325.0000 mg | ORAL_TABLET | Freq: Every day | ORAL | Status: DC
  Administered 2021-07-07: 325 mg via ORAL
  Filled 2021-07-07: qty 1

## 2021-07-07 MED ORDER — ASPIRIN EC 81 MG PO TBEC: 81.0000 mg | DELAYED_RELEASE_TABLET | Freq: Every day | ORAL | Status: DC

## 2021-07-07 MED ORDER — TIROFIBAN HCL IV 12.5 MG/250 ML
0.0750 ug/kg/min | INTRAVENOUS | Status: DC
  Administered 2021-07-07: 0.075 ug/kg/min via INTRAVENOUS
  Filled 2021-07-07 (×2): qty 250

## 2021-07-07 MED ORDER — VERAPAMIL HCL 2.5 MG/ML IV SOLN
INTRAVENOUS | Status: DC | PRN
  Administered 2021-07-07: 5 mL via INTRA_ARTERIAL

## 2021-07-07 MED ORDER — HYDRALAZINE HCL 20 MG/ML IJ SOLN: 10.0000 mg | INTRAMUSCULAR | Status: DC | PRN

## 2021-07-07 MED ORDER — ACETAMINOPHEN 325 MG PO TABS: 650.0000 mg | ORAL_TABLET | ORAL | Status: DC | PRN

## 2021-07-07 MED ORDER — ASPIRIN 81 MG PO CHEW: 81.0000 mg | CHEWABLE_TABLET | Freq: Once | ORAL | Status: DC

## 2021-07-07 MED ORDER — HEPARIN (PORCINE) 25000 UT/250ML-% IV SOLN
1400.0000 [IU]/h | INTRAVENOUS | Status: DC
  Administered 2021-07-07: 1400 [IU]/h via INTRAVENOUS
  Filled 2021-07-07: qty 250

## 2021-07-07 MED ORDER — METOPROLOL TARTRATE 5 MG/5ML IV SOLN
INTRAVENOUS | Status: AC
  Administered 2021-07-07: 5 mg via INTRAVENOUS
  Filled 2021-07-07: qty 5

## 2021-07-07 MED ORDER — ACETAMINOPHEN 325 MG PO TABS
650.0000 mg | ORAL_TABLET | ORAL | Status: DC | PRN
  Administered 2021-07-08: 650 mg via ORAL
  Filled 2021-07-07: qty 2

## 2021-07-07 MED ORDER — VERAPAMIL HCL 2.5 MG/ML IV SOLN
INTRAVENOUS | Status: AC
  Filled 2021-07-07: qty 2

## 2021-07-07 MED ORDER — TIROFIBAN HCL IN NACL 5-0.9 MG/100ML-% IV SOLN
INTRAVENOUS | Status: AC
  Filled 2021-07-07: qty 100

## 2021-07-07 MED ORDER — IOHEXOL 350 MG/ML SOLN
INTRAVENOUS | Status: DC | PRN
  Administered 2021-07-07: 155 mL

## 2021-07-07 MED ORDER — NITROGLYCERIN 1 MG/10 ML FOR IR/CATH LAB
INTRA_ARTERIAL | Status: AC
  Filled 2021-07-07: qty 10

## 2021-07-07 MED ORDER — MIDAZOLAM HCL 2 MG/2ML IJ SOLN
INTRAMUSCULAR | Status: AC
  Filled 2021-07-07: qty 2

## 2021-07-07 SURGICAL SUPPLY — 18 items
BALLN SAPPHIRE 2.0X12 (BALLOONS) ×2
BALLN ~~LOC~~ EUPHORA RX 2.5X12 (BALLOONS) ×2
BALLOON SAPPHIRE 2.0X12 (BALLOONS) IMPLANT
BALLOON ~~LOC~~ EUPHORA RX 2.5X12 (BALLOONS) IMPLANT
CATH DIAG 6FR JR4 (CATHETERS) ×1 IMPLANT
CATH INFINITI 5FR ANG PIGTAIL (CATHETERS) ×1 IMPLANT
CATH INFINITI 6F FL3.5 (CATHETERS) ×1 IMPLANT
CATH LAUNCHER 6FR EBU3.5 (CATHETERS) ×1 IMPLANT
DEVICE RAD COMP TR BAND LRG (VASCULAR PRODUCTS) ×1 IMPLANT
GLIDESHEATH SLEND SS 6F .021 (SHEATH) ×1 IMPLANT
GUIDELINER 6F (CATHETERS) ×1 IMPLANT
GUIDEWIRE INQWIRE 1.5J.035X260 (WIRE) IMPLANT
GUIDEWIRE VAS SION BLUE 190 (WIRE) ×1 IMPLANT
INQWIRE 1.5J .035X260CM (WIRE) ×2
KIT ENCORE 26 ADVANTAGE (KITS) ×1 IMPLANT
KIT HEMO VALVE WATCHDOG (MISCELLANEOUS) ×1 IMPLANT
STENT ONYX FRONTIER 2.5X15 (Permanent Stent) ×1 IMPLANT
WIRE ASAHI SION 300X3X12 .014 (WIRE) ×1 IMPLANT

## 2021-07-07 NOTE — Progress Notes (Signed)
Pharmacy CONSULT NOTE   Pharmacy Consult for tirofiban Indication:  CAD  No Known Allergies  Patient Measurements: Height: 6\' 2"  (188 cm) Weight: 115.7 kg (255 lb) IBW/kg (Calculated) : 82.2 Heparin Dosing Weight: n/a  Vital Signs: Temp: 98.6 F (37 C) (10/24 1900) Temp Source: Oral (10/24 1900) BP: 150/98 (10/24 1900) Pulse Rate: 70 (10/24 1818)  Labs: Recent Labs    07/07/21 1353 07/07/21 1539  HGB 14.0  --   HCT 40.6  --   PLT 205  --   APTT 24  --   LABPROT 12.6  --   INR 0.9  --   CREATININE 1.92*  --   TROPONINIHS 22,997* 20,631*    Estimated Creatinine Clearance: 58.1 mL/min (A) (by C-G formula based on SCr of 1.92 mg/dL (H)).   Medical History: Past Medical History:  Diagnosis Date   Anxiety    Depression    Diabetic neuropathy ([place])    Diabetic retinopathy ([place])    visual impairment   Dysrhythmia    "palpatations sometimes" (06/30/2017)   Exertional dyspnea 06/30/2021   Family history of adverse reaction to anesthesia    sister had OR 2016; "couldn't wake up; could hear what they were saying but couldn't get their attention; like I was paralyzed but fully awake" (06/30/2017)   Pneumonia X 2   SVT (supraventricular tachycardia) ([place])    Toe ulcer ([place]) 06/30/2017   2nd digit   Type II diabetes mellitus ([place])     Medications:  Infusions:   sodium chloride 100 mL/hr at 07/07/21 1939   sodium chloride     heparin 1,400 Units/hr (07/07/21 1416)   tirofiban 0.075 mcg/kg/min (07/07/21 1938)    Assessment: 55 yo male s/p PCI x 1, awaiting decision on plans for remaining multivessel disease.  Pharmacy asked to bridge antiplatelet therapy with tirofiban.    Goal of Therapy:   Monitor platelets by anticoagulation protocol: Yes   Plan:  Continue tirofiban 0.075 mgc/kg/min. CBC 6 hrs after tirofiban started. F/u plans for further intervention vs. CABG.  [place] Crane, Roylene Reason, BCCP Clinical Pharmacist  07/07/2021 8:22 PM   [facility]  pharmacy phone numbers are listed on [email or web address]

## 2021-07-07 NOTE — ED Notes (Signed)
This RN had the secretary page admitting and will continue to monitor.

## 2021-07-07 NOTE — Progress Notes (Signed)
ANTICOAGULATION CONSULT NOTE - Initial Consult  Pharmacy Consult for Heparin Indication: chest pain/ACS  No Known Allergies  Patient Measurements: Height: 6\' 2"  (188 cm) Weight: 115.7 kg (255 lb) IBW/kg (Calculated) : 82.2 Heparin Dosing Weight: 106.6 kg  Vital Signs: Temp: 98.8 F (37.1 C) (10/24 1323) Temp Source: Oral (10/24 1323) BP: 147/103 (10/24 1345) Pulse Rate: 105 (10/24 1345)  Labs: No results for input(s): HGB, HCT, PLT, APTT, LABPROT, INR, HEPARINUNFRC, HEPRLOWMOCWT, CREATININE, CKTOTAL, CKMB, TROPONINIHS in the last 72 hours.  Estimated Creatinine Clearance: 67.6 mL/min (A) (by C-G formula based on SCr of 1.65 mg/dL (H)).   Medical History: Past Medical History:  Diagnosis Date   Anxiety    Depression    Diabetic neuropathy ([facility])    Diabetic retinopathy ([place])    visual impairment   Dysrhythmia    "palpatations sometimes" (06/30/2017)   Exertional dyspnea 06/30/2021   Family history of adverse reaction to anesthesia    sister had OR 2016; "couldn't wake up; could hear what they were saying but couldn't get their attention; like I was paralyzed but fully awake" (06/30/2017)   Pneumonia X 2   SVT (supraventricular tachycardia) ([place])    Toe ulcer ([place]) 06/30/2017   2nd digit   Type II diabetes mellitus ([place])     Medications:  (Not in a hospital admission)  Scheduled:  Infusions:  PRN:   Assessment: 56 yom with a history of anxiety, depression, hypertension, diabetes, CKD stage 3, SVT s/p ablation. Patient is presenting as CODE STEMI. Heparin per pharmacy consult placed for chest pain/ACS.  Patient apparently developed chest discomfort during stress test today as outpatient and noted to have ST elevation on monitoring. Upon evaluation from Cards evaluation in ED, patient expressed that he dose not want to undergo cardiac stenting and does not want to take cardiac medicines.  Patient is on not on anticoagulation prior to arrival.  Hgb ip; plt  ip  Goal of Therapy:  Heparin level 0.3-0.7 units/ml Monitor platelets by anticoagulation protocol: Yes   Plan:  Give 4000 units bolus x 1 Start heparin infusion at 1400 units/hr Check anti-Xa level in 6-8 hours and daily while on heparin Continue to monitor H&H and platelets  Lorelei Pont, PharmD, BCPS 07/07/2021 1:55 PM ED Clinical Pharmacist -  [phone]
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
## 2021-07-07 NOTE — Interval H&P Note (Signed)
History and Physical Interval Note:  07/07/2021 5:03 PM  Billy [name]  has presented today for surgery, with the diagnosis of chest pain.  The various methods of treatment have been discussed with the patient and family. After consideration of risks, benefits and other options for treatment, the patient has consented to  Procedure(s): LEFT HEART CATH AND CORONARY ANGIOGRAPHY (N/A) as a surgical intervention.  The patient's history has been reviewed, patient examined, no change in status, stable for surgery.  I have reviewed the patient's chart and labs.  Questions were answered to the patient's satisfaction.    Cath Lab Visit (complete for each Cath Lab visit)  Clinical Evaluation Leading to the Procedure:   ACS: Yes.    Non-ACS:    Anginal Classification: CCS IV  Anti-ischemic medical therapy: Maximal Therapy (2 or more classes of medications)  Non-Invasive Test Results: High-risk stress test findings: cardiac mortality >3%/year  Prior CABG: No previous CABG        Early [place]

## 2021-07-07 NOTE — Telephone Encounter (Addendum)
   Called has doctor of the day, to see the patient emergently while he was in the midst of a stress test.  He was having severe nausea and chest pressure.  He had initially walked on the treadmill for about 8 minutes.  Baseline ECG showed poor R wave progression anteriorly with ST changes at baseline.  The ST changes appeared more pronounced during exercise.  He did not reach target heart rate.  He was then converted to a Taylor study.  After the Carlton Adam is when I first saw him.  He was very nauseated and diaphoretic.  ECG showed the persistent anterior changes.  He then went to atrial fibrillation.  This showed a rapid, irregular heart rate.  There appeared to be lateral ST elevation with inferior ST depression.  5 mg of IV Lopressor was given.  Patient's symptoms improved.  I talked to him about going to the emergency room and getting an emergent heart catheterization.  He stated initially he did not want heart cath.  He states he" does not want stents in my body."  He would be open to an angioplasty.  I explained that if the vessel tears after angioplasty, the stent would be the best treatment for him.  I spoke at length to his sister via his cell phone.  I explained to her that it appeared he was having a heart attack and that we recommended cardiac catheterization.  She was aware of his hesitation with stents and with medications.  She was at [facility] with another family member who is in the hospital.  She will talk to him as well.  I explained to both the patient and his sister that this was a matter of life and death.  He could die from his heart attack if he is not revascularized.  The patient stated: "I am ready to go.".  I again stated my recommendations and he was appreciative and told me that he understood my recommendations for cath and PCI.  Pain was down to 4 out of 10.  EMS then transported him to [facility].  I discussed the case with Dr. Burt Knack who will see him in the emergency  room.  Of note, his hesitation with stents was due to not wanting to take medications including blood thinners or beta-blockers.  I explained to him that he would only have to take Plavix potentially for 3 to 6 months if he felt so strongly against the medication.  He understood but was still skeptical about receiving a stent.  Jettie Booze, MD

## 2021-07-07 NOTE — ED Notes (Signed)
This RN went in to check on the pt and to get some more blood. Pt stated he is starting to get chest pressure again. Pt rates the pain 5/10. Pt is starting to become diaphoretic and clammy. This RN will page admitting and continue to monitor.
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
## 2021-07-07 NOTE — Progress Notes (Signed)
See my initial H&P from earlier today for details.  The patient has been talking more with his sister.  His chest pain has persisted and even worsened.  He requested to speak with me again.  I again reviewed the indication for emergency cardiac catheterization and PTCA/stenting for treatment of his acute myocardial infarction.  He would like to speak with his pastor who is on the way here.  However, he has changed his mind and is willing to proceed with cardiac catheterization and angioplasty/stenting as indicated.  He understands that he will need to take antiplatelet therapy and post MI medical therapy.  He is agreeable to this if indicated.  All of his questions were answered.  Sherren Mocha 07/07/2021 5:10 PM

## 2021-07-07 NOTE — ED Triage Notes (Signed)
Pt Billy [name] from a doctors office Per Dr. Abbott Pao was in the middle of a stress test when his ECG started showing a STEMI. Pt is vision impaired. Pt states he has chest pressure. Pt rates pain 7/10.

## 2021-07-08 ENCOUNTER — Inpatient Hospital Stay (HOSPITAL_COMMUNITY): Payer: Medicare HMO

## 2021-07-08 ENCOUNTER — Encounter (HOSPITAL_COMMUNITY): Payer: Self-pay | Admitting: Internal Medicine

## 2021-07-08 ENCOUNTER — Inpatient Hospital Stay (HOSPITAL_COMMUNITY): Payer: Medicare HMO | Admitting: Anesthesiology

## 2021-07-08 ENCOUNTER — Inpatient Hospital Stay (HOSPITAL_COMMUNITY)
Admission: EM | Disposition: A | Discharge status: Home / Self Care | Payer: Self-pay | Source: Home / Self Care | Attending: Thoracic Surgery (Cardiothoracic Vascular Surgery)

## 2021-07-08 ENCOUNTER — Other Ambulatory Visit (HOSPITAL_COMMUNITY): Payer: Self-pay

## 2021-07-08 DIAGNOSIS — I2510 Atherosclerotic heart disease of native coronary artery without angina pectoris: Secondary | ICD-10-CM | POA: Diagnosis not present

## 2021-07-08 DIAGNOSIS — I4811 Longstanding persistent atrial fibrillation: Secondary | ICD-10-CM | POA: Diagnosis not present

## 2021-07-08 DIAGNOSIS — I2109 ST elevation (STEMI) myocardial infarction involving other coronary artery of anterior wall: Secondary | ICD-10-CM | POA: Diagnosis not present

## 2021-07-08 DIAGNOSIS — J9811 Atelectasis: Secondary | ICD-10-CM | POA: Diagnosis not present

## 2021-07-08 DIAGNOSIS — I213 ST elevation (STEMI) myocardial infarction of unspecified site: Secondary | ICD-10-CM | POA: Diagnosis not present

## 2021-07-08 DIAGNOSIS — J90 Pleural effusion, not elsewhere classified: Secondary | ICD-10-CM | POA: Diagnosis not present

## 2021-07-08 DIAGNOSIS — Z951 Presence of aortocoronary bypass graft: Secondary | ICD-10-CM

## 2021-07-08 DIAGNOSIS — I214 Non-ST elevation (NSTEMI) myocardial infarction: Secondary | ICD-10-CM | POA: Diagnosis not present

## 2021-07-08 DIAGNOSIS — I517 Cardiomegaly: Secondary | ICD-10-CM | POA: Diagnosis not present

## 2021-07-08 HISTORY — PX: ENDOVEIN HARVEST OF GREATER SAPHENOUS VEIN: SHX5059

## 2021-07-08 HISTORY — PX: TEE WITHOUT CARDIOVERSION: SHX5443

## 2021-07-08 HISTORY — PX: MAZE: SHX5063

## 2021-07-08 HISTORY — PX: CORONARY ARTERY BYPASS GRAFT: SHX141

## 2021-07-08 HISTORY — PX: RADIAL ARTERY HARVEST: SHX5067

## 2021-07-08 LAB — POCT I-STAT 7, (LYTES, BLD GAS, ICA,H+H)
Acid-Base Excess: 1 mmol/L (ref 0.0–2.0)
Acid-Base Excess: 2 mmol/L (ref 0.0–2.0)
Acid-base deficit: 2 mmol/L (ref 0.0–2.0)
Acid-base deficit: 1 mmol/L (ref 0.0–2.0)
Acid-base deficit: 2 mmol/L (ref 0.0–2.0)
Acid-base deficit: 7 mmol/L — ABNORMAL HIGH (ref 0.0–2.0)
Bicarbonate: 22.3 mmol/L (ref 20.0–28.0)
Bicarbonate: 26.3 mmol/L (ref 20.0–28.0)
Bicarbonate: 26.4 mmol/L (ref 20.0–28.0)
Bicarbonate: 25 mmol/L (ref 20.0–28.0)
Bicarbonate: 22.7 mmol/L (ref 20.0–28.0)
Bicarbonate: 19.1 mmol/L — ABNORMAL LOW (ref 20.0–28.0)
Calcium, Ion: 1.1 mmol/L — ABNORMAL LOW (ref 1.15–1.40)
Calcium, Ion: 1.1 mmol/L — ABNORMAL LOW (ref 1.15–1.40)
Calcium, Ion: 1.13 mmol/L — ABNORMAL LOW (ref 1.15–1.40)
Calcium, Ion: 1.28 mmol/L (ref 1.15–1.40)
Calcium, Ion: 1.23 mmol/L (ref 1.15–1.40)
Calcium, Ion: 1.22 mmol/L (ref 1.15–1.40)
HCT: 26 % — ABNORMAL LOW (ref 39.0–52.0)
HCT: 26 % — ABNORMAL LOW (ref 39.0–52.0)
HCT: 27 % — ABNORMAL LOW (ref 39.0–52.0)
HCT: 32 % — ABNORMAL LOW (ref 39.0–52.0)
HCT: 30 % — ABNORMAL LOW (ref 39.0–52.0)
HCT: 29 % — ABNORMAL LOW (ref 39.0–52.0)
Hemoglobin: 8.8 g/dL — ABNORMAL LOW (ref 13.0–17.0)
Hemoglobin: 8.8 g/dL — ABNORMAL LOW (ref 13.0–17.0)
Hemoglobin: 9.2 g/dL — ABNORMAL LOW (ref 13.0–17.0)
Hemoglobin: 10.9 g/dL — ABNORMAL LOW (ref 13.0–17.0)
Hemoglobin: 10.2 g/dL — ABNORMAL LOW (ref 13.0–17.0)
Hemoglobin: 9.9 g/dL — ABNORMAL LOW (ref 13.0–17.0)
O2 Saturation: 100 %
O2 Saturation: 100 %
O2 Saturation: 100 %
O2 Saturation: 99 %
O2 Saturation: 96 %
O2 Saturation: 87 %
Patient temperature: 36.9
Patient temperature: 37.1
Potassium: 4.7 mmol/L (ref 3.5–5.1)
Potassium: 4.8 mmol/L (ref 3.5–5.1)
Potassium: 5.1 mmol/L (ref 3.5–5.1)
Potassium: 4.6 mmol/L (ref 3.5–5.1)
Potassium: 4.6 mmol/L (ref 3.5–5.1)
Potassium: 4.8 mmol/L (ref 3.5–5.1)
Sodium: 141 mmol/L (ref 135–145)
Sodium: 141 mmol/L (ref 135–145)
Sodium: 141 mmol/L (ref 135–145)
Sodium: 141 mmol/L (ref 135–145)
Sodium: 140 mmol/L (ref 135–145)
Sodium: 138 mmol/L (ref 135–145)
TCO2: 23 mmol/L (ref 22–32)
TCO2: 27 mmol/L (ref 22–32)
TCO2: 28 mmol/L (ref 22–32)
TCO2: 26 mmol/L (ref 22–32)
TCO2: 24 mmol/L (ref 22–32)
TCO2: 20 mmol/L — ABNORMAL LOW (ref 22–32)
pCO2 arterial: 33.2 mmHg (ref 32.0–48.0)
pCO2 arterial: 40.3 mmHg (ref 32.0–48.0)
pCO2 arterial: 44.3 mmHg (ref 32.0–48.0)
pCO2 arterial: 46.4 mmHg (ref 32.0–48.0)
pCO2 arterial: 39.1 mmHg (ref 32.0–48.0)
pCO2 arterial: 39.8 mmHg (ref 32.0–48.0)
pH, Arterial: 7.435 (ref 7.350–7.450)
pH, Arterial: 7.383 (ref 7.350–7.450)
pH, Arterial: 7.422 (ref 7.350–7.450)
pH, Arterial: 7.341 — ABNORMAL LOW (ref 7.350–7.450)
pH, Arterial: 7.37 (ref 7.350–7.450)
pH, Arterial: 7.288 — ABNORMAL LOW (ref 7.350–7.450)
pO2, Arterial: 271 mmHg — ABNORMAL HIGH (ref 83.0–108.0)
pO2, Arterial: 206 mmHg — ABNORMAL HIGH (ref 83.0–108.0)
pO2, Arterial: 348 mmHg — ABNORMAL HIGH (ref 83.0–108.0)
pO2, Arterial: 178 mmHg — ABNORMAL HIGH (ref 83.0–108.0)
pO2, Arterial: 88 mmHg (ref 83.0–108.0)
pO2, Arterial: 59 mmHg — ABNORMAL LOW (ref 83.0–108.0)

## 2021-07-08 LAB — POCT I-STAT, CHEM 8
BUN: 20 mg/dL (ref 6–20)
BUN: 20 mg/dL (ref 6–20)
BUN: 19 mg/dL (ref 6–20)
BUN: 20 mg/dL (ref 6–20)
Calcium, Ion: 1.23 mmol/L (ref 1.15–1.40)
Calcium, Ion: 1.22 mmol/L (ref 1.15–1.40)
Calcium, Ion: 1.08 mmol/L — ABNORMAL LOW (ref 1.15–1.40)
Calcium, Ion: 1.08 mmol/L — ABNORMAL LOW (ref 1.15–1.40)
Chloride: 105 mmol/L (ref 98–111)
Chloride: 106 mmol/L (ref 98–111)
Chloride: 104 mmol/L (ref 98–111)
Chloride: 106 mmol/L (ref 98–111)
Creatinine, Ser: 2.1 mg/dL — ABNORMAL HIGH (ref 0.61–1.24)
Creatinine, Ser: 2 mg/dL — ABNORMAL HIGH (ref 0.61–1.24)
Creatinine, Ser: 2.1 mg/dL — ABNORMAL HIGH (ref 0.61–1.24)
Creatinine, Ser: 2.1 mg/dL — ABNORMAL HIGH (ref 0.61–1.24)
Glucose, Bld: 199 mg/dL — ABNORMAL HIGH (ref 70–99)
Glucose, Bld: 151 mg/dL — ABNORMAL HIGH (ref 70–99)
Glucose, Bld: 116 mg/dL — ABNORMAL HIGH (ref 70–99)
Glucose, Bld: 120 mg/dL — ABNORMAL HIGH (ref 70–99)
HCT: 33 % — ABNORMAL LOW (ref 39.0–52.0)
HCT: 31 % — ABNORMAL LOW (ref 39.0–52.0)
HCT: 26 % — ABNORMAL LOW (ref 39.0–52.0)
HCT: 26 % — ABNORMAL LOW (ref 39.0–52.0)
Hemoglobin: 11.2 g/dL — ABNORMAL LOW (ref 13.0–17.0)
Hemoglobin: 10.5 g/dL — ABNORMAL LOW (ref 13.0–17.0)
Hemoglobin: 8.8 g/dL — ABNORMAL LOW (ref 13.0–17.0)
Hemoglobin: 8.8 g/dL — ABNORMAL LOW (ref 13.0–17.0)
Potassium: 4.3 mmol/L (ref 3.5–5.1)
Potassium: 4.2 mmol/L (ref 3.5–5.1)
Potassium: 4.4 mmol/L (ref 3.5–5.1)
Potassium: 4.9 mmol/L (ref 3.5–5.1)
Sodium: 140 mmol/L (ref 135–145)
Sodium: 142 mmol/L (ref 135–145)
Sodium: 141 mmol/L (ref 135–145)
Sodium: 139 mmol/L (ref 135–145)
TCO2: 26 mmol/L (ref 22–32)
TCO2: 27 mmol/L (ref 22–32)
TCO2: 24 mmol/L (ref 22–32)
TCO2: 27 mmol/L (ref 22–32)

## 2021-07-08 LAB — ECHO INTRAOPERATIVE TEE
AV Mean grad: 2 mmHg
Height: 74 in
Weight: 4081.16 oz

## 2021-07-08 LAB — GLUCOSE, CAPILLARY
Glucose-Capillary: 167 mg/dL — ABNORMAL HIGH (ref 70–99)
Glucose-Capillary: 170 mg/dL — ABNORMAL HIGH (ref 70–99)
Glucose-Capillary: 153 mg/dL — ABNORMAL HIGH (ref 70–99)
Glucose-Capillary: 163 mg/dL — ABNORMAL HIGH (ref 70–99)
Glucose-Capillary: 154 mg/dL — ABNORMAL HIGH (ref 70–99)
Glucose-Capillary: 146 mg/dL — ABNORMAL HIGH (ref 70–99)
Glucose-Capillary: 133 mg/dL — ABNORMAL HIGH (ref 70–99)
Glucose-Capillary: 159 mg/dL — ABNORMAL HIGH (ref 70–99)

## 2021-07-08 LAB — BASIC METABOLIC PANEL
Anion gap: 7 (ref 5–15)
BUN: 20 mg/dL (ref 6–20)
CO2: 23 mmol/L (ref 22–32)
Calcium: 8.2 mg/dL — ABNORMAL LOW (ref 8.9–10.3)
Chloride: 106 mmol/L (ref 98–111)
Creatinine, Ser: 1.99 mg/dL — ABNORMAL HIGH (ref 0.61–1.24)
GFR, Estimated: 39 mL/min — ABNORMAL LOW (ref >60)
Glucose, Bld: 172 mg/dL — ABNORMAL HIGH (ref 70–99)
Potassium: 4.5 mmol/L (ref 3.5–5.1)
Sodium: 136 mmol/L (ref 135–145)

## 2021-07-08 LAB — PREPARE RBC (CROSSMATCH)

## 2021-07-08 LAB — HEMOGLOBIN AND HEMATOCRIT, BLOOD
HCT: 26.8 % — ABNORMAL LOW (ref 39.0–52.0)
Hemoglobin: 9.1 g/dL — ABNORMAL LOW (ref 13.0–17.0)

## 2021-07-08 LAB — CBC
HCT: 38.6 % — ABNORMAL LOW (ref 39.0–52.0)
HCT: 38.8 % — ABNORMAL LOW (ref 39.0–52.0)
HCT: 31.6 % — ABNORMAL LOW (ref 39.0–52.0)
Hemoglobin: 12.7 g/dL — ABNORMAL LOW (ref 13.0–17.0)
Hemoglobin: 13.6 g/dL (ref 13.0–17.0)
Hemoglobin: 10.5 g/dL — ABNORMAL LOW (ref 13.0–17.0)
MCH: 30.6 pg (ref 26.0–34.0)
MCH: 32 pg (ref 26.0–34.0)
MCH: 31.4 pg (ref 26.0–34.0)
MCHC: 32.9 g/dL (ref 30.0–36.0)
MCHC: 35.1 g/dL (ref 30.0–36.0)
MCHC: 33.2 g/dL (ref 30.0–36.0)
MCV: 93 fL (ref 80.0–100.0)
MCV: 91.3 fL (ref 80.0–100.0)
MCV: 94.6 fL (ref 80.0–100.0)
Platelets: 192 10*3/uL (ref 150–400)
Platelets: 192 10*3/uL (ref 150–400)
Platelets: 147 10*3/uL — ABNORMAL LOW (ref 150–400)
RBC: 4.15 MIL/uL — ABNORMAL LOW (ref 4.22–5.81)
RBC: 4.25 MIL/uL (ref 4.22–5.81)
RBC: 3.34 MIL/uL — ABNORMAL LOW (ref 4.22–5.81)
RDW: 12.2 % (ref 11.5–15.5)
RDW: 12.3 % (ref 11.5–15.5)
RDW: 12.4 % (ref 11.5–15.5)
WBC: 7.9 10*3/uL (ref 4.0–10.5)
WBC: 7.7 10*3/uL (ref 4.0–10.5)
WBC: 11.8 10*3/uL — ABNORMAL HIGH (ref 4.0–10.5)
nRBC: 0 % (ref 0.0–0.2)
nRBC: 0 % (ref 0.0–0.2)
nRBC: 0 % (ref 0.0–0.2)

## 2021-07-08 LAB — SURGICAL PCR SCREEN
MRSA, PCR: NEGATIVE
Staphylococcus aureus: NEGATIVE

## 2021-07-08 LAB — POCT I-STAT EG7
Acid-base deficit: 4 mmol/L — ABNORMAL HIGH (ref 0.0–2.0)
Bicarbonate: 22.7 mmol/L (ref 20.0–28.0)
Calcium, Ion: 1.1 mmol/L — ABNORMAL LOW (ref 1.15–1.40)
HCT: 31 % — ABNORMAL LOW (ref 39.0–52.0)
Hemoglobin: 10.5 g/dL — ABNORMAL LOW (ref 13.0–17.0)
O2 Saturation: 64 %
Potassium: 4.4 mmol/L (ref 3.5–5.1)
Sodium: 143 mmol/L (ref 135–145)
TCO2: 24 mmol/L (ref 22–32)
pCO2, Ven: 46.6 mmHg (ref 44.0–60.0)
pH, Ven: 7.295 (ref 7.250–7.430)
pO2, Ven: 37 mmHg (ref 32.0–45.0)

## 2021-07-08 LAB — APTT: aPTT: 33 seconds (ref 24–36)

## 2021-07-08 LAB — PROTIME-INR
INR: 1.3 — ABNORMAL HIGH (ref 0.8–1.2)
Prothrombin Time: 15.9 seconds — ABNORMAL HIGH (ref 11.4–15.2)

## 2021-07-08 LAB — PLATELET COUNT: Platelets: 190 10*3/uL (ref 150–400)

## 2021-07-08 LAB — ABO/RH: ABO/RH(D): O NEG

## 2021-07-08 LAB — MAGNESIUM: Magnesium: 1.8 mg/dL (ref 1.7–2.4)

## 2021-07-08 SURGERY — CORONARY ARTERY BYPASS GRAFTING (CABG)
Anesthesia: General | Site: Leg Upper | Laterality: Right

## 2021-07-08 MED ORDER — SODIUM CHLORIDE 0.9 % IV BOLUS
500.0000 mL | Freq: Once | INTRAVENOUS | Status: AC
  Administered 2021-07-08: 500 mL via INTRAVENOUS

## 2021-07-08 MED ORDER — SUCCINYLCHOLINE CHLORIDE 200 MG/10ML IV SOSY
PREFILLED_SYRINGE | INTRAVENOUS | Status: AC
  Filled 2021-07-08: qty 10

## 2021-07-08 MED ORDER — SODIUM CHLORIDE 0.9 % IV SOLN
1.5000 mg/kg/h | INTRAVENOUS | Status: DC
Start: 2021-07-09 — End: 2021-07-08
  Filled 2021-07-08: qty 25

## 2021-07-08 MED ORDER — PROTAMINE SULFATE 10 MG/ML IV SOLN
INTRAVENOUS | Status: DC | PRN
  Administered 2021-07-08: 50 mg via INTRAVENOUS
  Administered 2021-07-08 (×2): 20 mg via INTRAVENOUS
  Administered 2021-07-08: 50 mg via INTRAVENOUS
  Administered 2021-07-08: 10 mg via INTRAVENOUS
  Administered 2021-07-08: 20 mg via INTRAVENOUS
  Administered 2021-07-08 (×2): 50 mg via INTRAVENOUS
  Administered 2021-07-08 (×4): 20 mg via INTRAVENOUS

## 2021-07-08 MED ORDER — LACTATED RINGERS IV SOLN: 500.0000 mL | Freq: Once | INTRAVENOUS | Status: DC | PRN

## 2021-07-08 MED ORDER — POTASSIUM CHLORIDE 10 MEQ/50ML IV SOLN
10.0000 meq | INTRAVENOUS | Status: DC
Start: 2021-07-08 — End: 2021-07-08

## 2021-07-08 MED ORDER — SODIUM CHLORIDE 0.9% FLUSH
3.0000 mL | Freq: Two times a day (BID) | INTRAVENOUS | Status: DC
  Administered 2021-07-09 – 2021-08-06 (×52): 3 mL via INTRAVENOUS

## 2021-07-08 MED ORDER — AMIODARONE HCL IN DEXTROSE 360-4.14 MG/200ML-% IV SOLN
INTRAVENOUS | Status: DC | PRN
  Administered 2021-07-08 (×2): 60 mg/h via INTRAVENOUS

## 2021-07-08 MED ORDER — EPHEDRINE SULFATE 50 MG/ML IJ SOLN
INTRAMUSCULAR | Status: DC | PRN
  Administered 2021-07-08 (×2): 5 mg via INTRAVENOUS

## 2021-07-08 MED ORDER — 0.9 % SODIUM CHLORIDE (POUR BTL) OPTIME
TOPICAL | Status: DC | PRN
  Administered 2021-07-08: 5000 mL

## 2021-07-08 MED ORDER — NOREPINEPHRINE 4 MG/250ML-% IV SOLN
0.0000 ug/min | INTRAVENOUS | Status: DC
  Filled 2021-07-08: qty 250

## 2021-07-08 MED ORDER — SODIUM BICARBONATE 8.4 % IV SOLN
50.0000 meq | Freq: Once | INTRAVENOUS | Status: AC
  Administered 2021-07-08: 50 meq via INTRAVENOUS

## 2021-07-08 MED ORDER — NICARDIPINE HCL IN NACL 20-0.86 MG/200ML-% IV SOLN
0.0000 mg/h | INTRAVENOUS | Status: DC
  Administered 2021-07-08 – 2021-07-09 (×3): 2.5 mg/h via INTRAVENOUS
  Filled 2021-07-08 (×3): qty 200

## 2021-07-08 MED ORDER — DEXMEDETOMIDINE HCL IN NACL 400 MCG/100ML IV SOLN
0.0000 ug/kg/h | INTRAVENOUS | Status: DC
  Administered 2021-07-08: 1 ug/kg/h via INTRAVENOUS
  Administered 2021-07-09: 0.2 ug/kg/h via INTRAVENOUS
  Filled 2021-07-08 (×2): qty 100

## 2021-07-08 MED ORDER — MILRINONE LACTATE IN DEXTROSE 20-5 MG/100ML-% IV SOLN
0.3000 ug/kg/min | INTRAVENOUS | Status: DC
  Filled 2021-07-08: qty 100

## 2021-07-08 MED ORDER — CEFAZOLIN SODIUM-DEXTROSE 2-4 GM/100ML-% IV SOLN
2.0000 g | Freq: Three times a day (TID) | INTRAVENOUS | Status: DC
  Administered 2021-07-08 – 2021-07-10 (×5): 2 g via INTRAVENOUS
  Filled 2021-07-08 (×5): qty 100

## 2021-07-08 MED ORDER — INSULIN REGULAR(HUMAN) IN NACL 100-0.9 UT/100ML-% IV SOLN
INTRAVENOUS | Status: DC
  Filled 2021-07-08: qty 100

## 2021-07-08 MED ORDER — HEPARIN 30,000 UNITS/1000 ML (OHS) CELLSAVER SOLUTION
Status: DC
  Filled 2021-07-08: qty 1000

## 2021-07-08 MED ORDER — ALBUMIN HUMAN 5 % IV SOLN
250.0000 mL | INTRAVENOUS | Status: DC | PRN
Start: 2021-07-08 — End: 2021-07-08

## 2021-07-08 MED ORDER — DOBUTAMINE INFUSION FOR EP/ECHO/NUC (1000 MCG/ML)
INTRAVENOUS | Status: DC | PRN
  Administered 2021-07-08: 2.5 ug/kg/min via INTRAVENOUS

## 2021-07-08 MED ORDER — SODIUM CHLORIDE 0.9% FLUSH
10.0000 mL | Freq: Two times a day (BID) | INTRAVENOUS | Status: DC
  Administered 2021-07-09 – 2021-07-12 (×7): 10 mL
  Administered 2021-07-12: 20 mL
  Administered 2021-07-13 – 2021-07-17 (×9): 10 mL

## 2021-07-08 MED ORDER — DEXMEDETOMIDINE HCL IN NACL 400 MCG/100ML IV SOLN
0.1000 ug/kg/h | INTRAVENOUS | Status: AC
  Administered 2021-07-08: .7 ug/kg/h via INTRAVENOUS
  Filled 2021-07-08: qty 100

## 2021-07-08 MED ORDER — VANCOMYCIN HCL 1500 MG/300ML IV SOLN
1500.0000 mg | INTRAVENOUS | Status: AC
  Administered 2021-07-08: 1500 mg via INTRAVENOUS
  Filled 2021-07-08: qty 300

## 2021-07-08 MED ORDER — SODIUM CHLORIDE 0.9% FLUSH: 10.0000 mL | INTRAVENOUS | Status: DC | PRN

## 2021-07-08 MED ORDER — NITROGLYCERIN IN D5W 200-5 MCG/ML-% IV SOLN
2.0000 ug/min | INTRAVENOUS | Status: AC
  Administered 2021-07-08: 10 ug/min via INTRAVENOUS
  Filled 2021-07-08: qty 250

## 2021-07-08 MED ORDER — AMIODARONE IV BOLUS ONLY 150 MG/100ML
INTRAVENOUS | Status: DC | PRN
  Administered 2021-07-08: 150 mg via INTRAVENOUS

## 2021-07-08 MED ORDER — TRANEXAMIC ACID (OHS) PUMP PRIME SOLUTION
2.0000 mg/kg | INTRAVENOUS | Status: DC
  Filled 2021-07-08: qty 2.31

## 2021-07-08 MED ORDER — MAGNESIUM SULFATE 4 GM/100ML IV SOLN
4.0000 g | Freq: Once | INTRAVENOUS | Status: AC
  Administered 2021-07-08: 4 g via INTRAVENOUS
  Filled 2021-07-08: qty 100

## 2021-07-08 MED ORDER — ESMOLOL HCL 100 MG/10ML IV SOLN
INTRAVENOUS | Status: AC
  Filled 2021-07-08: qty 10

## 2021-07-08 MED ORDER — BISACODYL 5 MG PO TBEC: 5.0000 mg | DELAYED_RELEASE_TABLET | Freq: Once | ORAL | Status: DC

## 2021-07-08 MED ORDER — ONDANSETRON HCL 4 MG/2ML IJ SOLN
4.0000 mg | Freq: Four times a day (QID) | INTRAMUSCULAR | Status: DC | PRN
  Administered 2021-07-09 – 2021-07-24 (×6): 4 mg via INTRAVENOUS
  Filled 2021-07-08 (×6): qty 2

## 2021-07-08 MED ORDER — MORPHINE SULFATE (PF) 2 MG/ML IV SOLN
1.0000 mg | INTRAVENOUS | Status: DC | PRN
  Administered 2021-07-09 (×4): 2 mg via INTRAVENOUS
  Filled 2021-07-08: qty 1
  Filled 2021-07-08: qty 2
  Filled 2021-07-08: qty 1

## 2021-07-08 MED ORDER — TRANEXAMIC ACID 1000 MG/10ML IV SOLN
1.5000 mg/kg/h | INTRAVENOUS | Status: AC
  Administered 2021-07-08 (×2): 1.5 mg/kg/h via INTRAVENOUS
  Filled 2021-07-08 (×2): qty 25

## 2021-07-08 MED ORDER — NITROGLYCERIN IN D5W 200-5 MCG/ML-% IV SOLN
0.0000 ug/min | INTRAVENOUS | Status: DC
Start: 2021-07-08 — End: 2021-07-09

## 2021-07-08 MED ORDER — TRANEXAMIC ACID (OHS) PUMP PRIME SOLUTION
2.0000 mg/kg | INTRAVENOUS | Status: DC
Start: 2021-07-09 — End: 2021-07-08
  Filled 2021-07-08: qty 2.31

## 2021-07-08 MED ORDER — LACTATED RINGERS IV SOLN: INTRAVENOUS | Status: DC | PRN

## 2021-07-08 MED ORDER — VANCOMYCIN HCL 1500 MG/300ML IV SOLN
1500.0000 mg | INTRAVENOUS | Status: DC
  Filled 2021-07-08: qty 300

## 2021-07-08 MED ORDER — BISACODYL 5 MG PO TBEC
10.0000 mg | DELAYED_RELEASE_TABLET | Freq: Every day | ORAL | Status: DC
  Administered 2021-07-09: 10 mg via ORAL
  Filled 2021-07-08: qty 2

## 2021-07-08 MED ORDER — ALBUMIN HUMAN 25 % IV SOLN
12.5000 g | INTRAVENOUS | Status: DC | PRN
Start: 2021-07-08 — End: 2021-07-22
  Administered 2021-07-08 (×2): 12.5 g via INTRAVENOUS
  Filled 2021-07-08 (×2): qty 50

## 2021-07-08 MED ORDER — CHLORHEXIDINE GLUCONATE CLOTH 2 % EX PADS
6.0000 | MEDICATED_PAD | Freq: Once | CUTANEOUS | Status: AC
  Administered 2021-07-08: 6 via TOPICAL

## 2021-07-08 MED ORDER — ACETAMINOPHEN 500 MG PO TABS
1000.0000 mg | ORAL_TABLET | Freq: Four times a day (QID) | ORAL | Status: AC
  Administered 2021-07-09 – 2021-07-12 (×3): 1000 mg via ORAL
  Filled 2021-07-08 (×7): qty 2

## 2021-07-08 MED ORDER — TEMAZEPAM 15 MG PO CAPS: 15.0000 mg | ORAL_CAPSULE | Freq: Once | ORAL | Status: DC | PRN

## 2021-07-08 MED ORDER — SODIUM CHLORIDE 0.9% FLUSH: 3.0000 mL | INTRAVENOUS | Status: DC | PRN

## 2021-07-08 MED ORDER — LACTATED RINGERS IV SOLN
INTRAVENOUS | Status: DC | PRN
Start: 2021-07-08 — End: 2021-07-08

## 2021-07-08 MED ORDER — METOPROLOL TARTRATE 12.5 MG HALF TABLET: 12.5000 mg | ORAL_TABLET | Freq: Once | ORAL | Status: DC

## 2021-07-08 MED ORDER — OXYCODONE HCL 5 MG PO TABS
5.0000 mg | ORAL_TABLET | ORAL | Status: DC | PRN
  Administered 2021-07-09 – 2021-07-12 (×3): 10 mg via ORAL
  Administered 2021-07-12 (×2): 5 mg via ORAL
  Filled 2021-07-08: qty 1
  Filled 2021-07-08 (×3): qty 2
  Filled 2021-07-08: qty 1

## 2021-07-08 MED ORDER — METOPROLOL TARTRATE 25 MG/10 ML ORAL SUSPENSION: 12.5000 mg | Freq: Two times a day (BID) | ORAL | Status: DC

## 2021-07-08 MED ORDER — POTASSIUM CHLORIDE 2 MEQ/ML IV SOLN
80.0000 meq | INTRAVENOUS | Status: DC
  Filled 2021-07-08: qty 40

## 2021-07-08 MED ORDER — LACTATED RINGERS IV SOLN: INTRAVENOUS | Status: DC

## 2021-07-08 MED ORDER — PROPOFOL 10 MG/ML IV BOLUS
INTRAVENOUS | Status: DC | PRN
  Administered 2021-07-08: 30 mg via INTRAVENOUS

## 2021-07-08 MED ORDER — FENTANYL CITRATE (PF) 250 MCG/5ML IJ SOLN
INTRAMUSCULAR | Status: DC | PRN
  Administered 2021-07-08: 200 ug via INTRAVENOUS
  Administered 2021-07-08: 450 ug via INTRAVENOUS
  Administered 2021-07-08 (×2): 150 ug via INTRAVENOUS
  Administered 2021-07-08: 200 ug via INTRAVENOUS
  Administered 2021-07-08: 50 ug via INTRAVENOUS
  Administered 2021-07-08 (×2): 150 ug via INTRAVENOUS
  Administered 2021-07-08: 250 ug via INTRAVENOUS

## 2021-07-08 MED ORDER — AMIODARONE HCL IN DEXTROSE 360-4.14 MG/200ML-% IV SOLN
60.0000 mg/h | INTRAVENOUS | Status: AC
  Administered 2021-07-08: 60 mg/h via INTRAVENOUS

## 2021-07-08 MED ORDER — PROPOFOL 10 MG/ML IV BOLUS
INTRAVENOUS | Status: AC
  Filled 2021-07-08: qty 20

## 2021-07-08 MED ORDER — SODIUM CHLORIDE (PF) 0.9 % IJ SOLN
OROMUCOSAL | Status: DC | PRN
  Administered 2021-07-08: 8 mL via TOPICAL

## 2021-07-08 MED ORDER — PANTOPRAZOLE SODIUM 40 MG PO TBEC: 40.0000 mg | DELAYED_RELEASE_TABLET | Freq: Every day | ORAL | Status: DC

## 2021-07-08 MED ORDER — ROCURONIUM BROMIDE 10 MG/ML (PF) SYRINGE
PREFILLED_SYRINGE | INTRAVENOUS | Status: AC
  Filled 2021-07-08: qty 10

## 2021-07-08 MED ORDER — PHENYLEPHRINE HCL-NACL 20-0.9 MG/250ML-% IV SOLN
30.0000 ug/min | INTRAVENOUS | Status: DC
  Filled 2021-07-08: qty 250

## 2021-07-08 MED ORDER — POTASSIUM CHLORIDE 2 MEQ/ML IV SOLN
80.0000 meq | INTRAVENOUS | Status: DC
Start: 2021-07-09 — End: 2021-07-08
  Filled 2021-07-08: qty 40

## 2021-07-08 MED ORDER — MANNITOL 20 % IV SOLN
INTRAVENOUS | Status: DC
  Filled 2021-07-08: qty 13

## 2021-07-08 MED ORDER — CHLORHEXIDINE GLUCONATE CLOTH 2 % EX PADS: 6.0000 | MEDICATED_PAD | Freq: Once | CUTANEOUS | Status: DC

## 2021-07-08 MED ORDER — VASOPRESSIN 20 UNITS/100 ML INFUSION FOR SHOCK: 0.0000 [IU]/min | INTRAVENOUS | Status: DC

## 2021-07-08 MED ORDER — CHLORHEXIDINE GLUCONATE 4 % EX LIQD
CUTANEOUS | Status: AC
  Filled 2021-07-08: qty 60

## 2021-07-08 MED ORDER — NITROGLYCERIN 0.2 MG/ML ON CALL CATH LAB
INTRAVENOUS | Status: DC | PRN
  Administered 2021-07-08 (×2): 40 ug via INTRAVENOUS

## 2021-07-08 MED ORDER — SODIUM CHLORIDE 0.45 % IV SOLN: INTRAVENOUS | Status: DC | PRN

## 2021-07-08 MED ORDER — CHLORHEXIDINE GLUCONATE CLOTH 2 % EX PADS
6.0000 | MEDICATED_PAD | Freq: Every day | CUTANEOUS | Status: DC
  Administered 2021-07-08: 6 via TOPICAL

## 2021-07-08 MED ORDER — PHENYLEPHRINE 40 MCG/ML (10ML) SYRINGE FOR IV PUSH (FOR BLOOD PRESSURE SUPPORT)
PREFILLED_SYRINGE | INTRAVENOUS | Status: AC
  Filled 2021-07-08: qty 10

## 2021-07-08 MED ORDER — FENTANYL CITRATE (PF) 250 MCG/5ML IJ SOLN
INTRAMUSCULAR | Status: AC
  Filled 2021-07-08: qty 30

## 2021-07-08 MED ORDER — SODIUM CHLORIDE 0.9 % IV SOLN: 250.0000 mL | INTRAVENOUS | Status: DC

## 2021-07-08 MED ORDER — MIDAZOLAM HCL 2 MG/2ML IJ SOLN
2.0000 mg | INTRAMUSCULAR | Status: DC | PRN
  Administered 2021-07-09: 2 mg via INTRAVENOUS
  Filled 2021-07-08: qty 2

## 2021-07-08 MED ORDER — VANCOMYCIN HCL IN DEXTROSE 1-5 GM/200ML-% IV SOLN
1000.0000 mg | Freq: Once | INTRAVENOUS | Status: AC
  Administered 2021-07-08: 1000 mg via INTRAVENOUS
  Filled 2021-07-08: qty 200

## 2021-07-08 MED ORDER — INSULIN REGULAR(HUMAN) IN NACL 100-0.9 UT/100ML-% IV SOLN: INTRAVENOUS | Status: DC

## 2021-07-08 MED ORDER — FENTANYL CITRATE (PF) 250 MCG/5ML IJ SOLN
INTRAMUSCULAR | Status: AC
  Filled 2021-07-08: qty 5

## 2021-07-08 MED ORDER — CEFAZOLIN SODIUM-DEXTROSE 2-4 GM/100ML-% IV SOLN
2.0000 g | INTRAVENOUS | Status: DC
  Filled 2021-07-08: qty 100

## 2021-07-08 MED ORDER — CHLORHEXIDINE GLUCONATE 0.12% ORAL RINSE (MEDLINE KIT)
15.0000 mL | Freq: Two times a day (BID) | OROMUCOSAL | Status: DC
  Administered 2021-07-08: 15 mL via OROMUCOSAL

## 2021-07-08 MED ORDER — BISACODYL 10 MG RE SUPP
10.0000 mg | Freq: Every day | RECTAL | Status: DC
  Administered 2021-07-12: 10 mg via RECTAL
  Filled 2021-07-08: qty 1

## 2021-07-08 MED ORDER — CHLORHEXIDINE GLUCONATE CLOTH 2 % EX PADS
6.0000 | MEDICATED_PAD | Freq: Every day | CUTANEOUS | Status: DC
  Administered 2021-07-08 – 2021-07-20 (×13): 6 via TOPICAL

## 2021-07-08 MED ORDER — PHENYLEPHRINE HCL-NACL 20-0.9 MG/250ML-% IV SOLN: 0.0000 ug/min | INTRAVENOUS | Status: DC

## 2021-07-08 MED ORDER — ASPIRIN EC 325 MG PO TBEC: 325.0000 mg | DELAYED_RELEASE_TABLET | Freq: Every day | ORAL | Status: DC

## 2021-07-08 MED ORDER — ACETAMINOPHEN 650 MG RE SUPP
650.0000 mg | Freq: Once | RECTAL | Status: AC
  Administered 2021-07-08: 650 mg via RECTAL

## 2021-07-08 MED ORDER — METOPROLOL TARTRATE 12.5 MG HALF TABLET
12.5000 mg | ORAL_TABLET | Freq: Once | ORAL | Status: AC
  Administered 2021-07-08: 12.5 mg via ORAL
  Filled 2021-07-08: qty 1

## 2021-07-08 MED ORDER — CHLORHEXIDINE GLUCONATE 0.12 % MT SOLN: 15.0000 mL | Freq: Once | OROMUCOSAL | Status: DC

## 2021-07-08 MED ORDER — DEXTROSE 50 % IV SOLN: 0.0000 mL | INTRAVENOUS | Status: DC | PRN

## 2021-07-08 MED ORDER — EPINEPHRINE HCL 5 MG/250ML IV SOLN IN NS
0.0000 ug/min | INTRAVENOUS | Status: DC
  Filled 2021-07-08: qty 250

## 2021-07-08 MED ORDER — PHENYLEPHRINE HCL (PRESSORS) 10 MG/ML IV SOLN
INTRAVENOUS | Status: DC | PRN
  Administered 2021-07-08 (×2): 80 ug via INTRAVENOUS
  Administered 2021-07-08 (×2): 40 ug via INTRAVENOUS
  Administered 2021-07-08 (×2): 80 ug via INTRAVENOUS

## 2021-07-08 MED ORDER — TRANEXAMIC ACID (OHS) BOLUS VIA INFUSION
15.0000 mg/kg | INTRAVENOUS | Status: AC
  Administered 2021-07-08: 1233 mg via INTRAVENOUS
  Filled 2021-07-08: qty 1233

## 2021-07-08 MED ORDER — METOPROLOL TARTRATE 12.5 MG HALF TABLET: 12.5000 mg | ORAL_TABLET | Freq: Two times a day (BID) | ORAL | Status: DC

## 2021-07-08 MED ORDER — ORAL CARE MOUTH RINSE
15.0000 mL | OROMUCOSAL | Status: DC
  Administered 2021-07-08 – 2021-07-09 (×3): 15 mL via OROMUCOSAL

## 2021-07-08 MED ORDER — SODIUM CHLORIDE 0.9 % IV BOLUS
250.0000 mL | INTRAVENOUS | Status: DC | PRN
  Administered 2021-07-08: 250 mL via INTRAVENOUS

## 2021-07-08 MED ORDER — PHENYLEPHRINE HCL-NACL 20-0.9 MG/250ML-% IV SOLN
30.0000 ug/min | INTRAVENOUS | Status: AC
  Administered 2021-07-08: 25 ug/min via INTRAVENOUS
  Filled 2021-07-08: qty 250

## 2021-07-08 MED ORDER — DEXMEDETOMIDINE HCL IN NACL 400 MCG/100ML IV SOLN
0.1000 ug/kg/h | INTRAVENOUS | Status: DC
  Filled 2021-07-08: qty 100

## 2021-07-08 MED ORDER — AMIODARONE HCL IN DEXTROSE 360-4.14 MG/200ML-% IV SOLN
30.0000 mg/h | INTRAVENOUS | Status: DC
  Administered 2021-07-09 (×3): 30 mg/h via INTRAVENOUS
  Filled 2021-07-08 (×4): qty 200

## 2021-07-08 MED ORDER — CEFAZOLIN SODIUM-DEXTROSE 2-4 GM/100ML-% IV SOLN
2.0000 g | INTRAVENOUS | Status: AC
  Administered 2021-07-08 (×2): 2 g via INTRAVENOUS
  Filled 2021-07-08: qty 100

## 2021-07-08 MED ORDER — MAGNESIUM SULFATE 50 % IJ SOLN
INTRAMUSCULAR | Status: DC
  Filled 2021-07-08: qty 13

## 2021-07-08 MED ORDER — ROCURONIUM BROMIDE 10 MG/ML (PF) SYRINGE
PREFILLED_SYRINGE | INTRAVENOUS | Status: DC | PRN
  Administered 2021-07-08: 70 mg via INTRAVENOUS
  Administered 2021-07-08: 30 mg via INTRAVENOUS
  Administered 2021-07-08: 100 mg via INTRAVENOUS
  Administered 2021-07-08: 50 mg via INTRAVENOUS

## 2021-07-08 MED ORDER — DOCUSATE SODIUM 100 MG PO CAPS
200.0000 mg | ORAL_CAPSULE | Freq: Every day | ORAL | Status: DC
  Administered 2021-07-09: 200 mg via ORAL
  Filled 2021-07-08: qty 2

## 2021-07-08 MED ORDER — ARTIFICIAL TEARS OPHTHALMIC OINT
TOPICAL_OINTMENT | OPHTHALMIC | Status: DC | PRN
  Administered 2021-07-08: 1 via OPHTHALMIC

## 2021-07-08 MED ORDER — ACETAMINOPHEN 160 MG/5ML PO SOLN
1000.0000 mg | Freq: Four times a day (QID) | ORAL | Status: AC
Start: 2021-07-09 — End: 2021-07-13
  Administered 2021-07-08 – 2021-07-13 (×13): 1000 mg
  Filled 2021-07-08 (×13): qty 40.6

## 2021-07-08 MED ORDER — HEPARIN SODIUM (PORCINE) 1000 UNIT/ML IJ SOLN
INTRAMUSCULAR | Status: DC | PRN
  Administered 2021-07-08: 35000 [IU] via INTRAVENOUS

## 2021-07-08 MED ORDER — MIDAZOLAM HCL 5 MG/5ML IJ SOLN
INTRAMUSCULAR | Status: DC | PRN
  Administered 2021-07-08: 4 mg via INTRAVENOUS
  Administered 2021-07-08: 1 mg via INTRAVENOUS

## 2021-07-08 MED ORDER — ESMOLOL HCL 100 MG/10ML IV SOLN
INTRAVENOUS | Status: DC | PRN
  Administered 2021-07-08 (×2): 30 mg via INTRAVENOUS

## 2021-07-08 MED ORDER — INSULIN REGULAR(HUMAN) IN NACL 100-0.9 UT/100ML-% IV SOLN
INTRAVENOUS | Status: AC
Start: 2021-07-09 — End: 2021-07-08
  Administered 2021-07-08: 6 [IU]/h via INTRAVENOUS
  Filled 2021-07-08: qty 100

## 2021-07-08 MED ORDER — DOBUTAMINE IN D5W 4-5 MG/ML-% IV SOLN: 0.0000 ug/kg/min | INTRAVENOUS | Status: DC

## 2021-07-08 MED ORDER — METOPROLOL TARTRATE 5 MG/5ML IV SOLN
2.5000 mg | INTRAVENOUS | Status: DC | PRN
  Administered 2021-07-09: 2.5 mg via INTRAVENOUS
  Administered 2021-07-15 – 2021-07-16 (×2): 5 mg via INTRAVENOUS
  Filled 2021-07-08 (×3): qty 5

## 2021-07-08 MED ORDER — EPHEDRINE 5 MG/ML INJ
INTRAVENOUS | Status: AC
  Filled 2021-07-08: qty 5

## 2021-07-08 MED ORDER — PLASMA-LYTE A IV SOLN
INTRAVENOUS | Status: AC
  Administered 2021-07-08: 1000 mL
  Filled 2021-07-08: qty 5

## 2021-07-08 MED ORDER — CEFAZOLIN SODIUM-DEXTROSE 2-4 GM/100ML-% IV SOLN
2.0000 g | INTRAVENOUS | Status: DC
Start: 2021-07-09 — End: 2021-07-08
  Filled 2021-07-08: qty 100

## 2021-07-08 MED ORDER — TRAMADOL HCL 50 MG PO TABS
50.0000 mg | ORAL_TABLET | ORAL | Status: DC | PRN
  Administered 2021-07-09: 100 mg via ORAL
  Filled 2021-07-08: qty 2

## 2021-07-08 MED ORDER — PLASMA-LYTE A IV SOLN
INTRAVENOUS | Status: DC
  Filled 2021-07-08: qty 5

## 2021-07-08 MED ORDER — TRANEXAMIC ACID (OHS) BOLUS VIA INFUSION
15.0000 mg/kg | INTRAVENOUS | Status: DC
  Filled 2021-07-08: qty 1736

## 2021-07-08 MED ORDER — NITROGLYCERIN IN D5W 200-5 MCG/ML-% IV SOLN
2.0000 ug/min | INTRAVENOUS | Status: DC
Start: 2021-07-09 — End: 2021-07-08
  Filled 2021-07-08: qty 250

## 2021-07-08 MED ORDER — SODIUM CHLORIDE 0.9 % IV SOLN: INTRAVENOUS | Status: DC

## 2021-07-08 MED ORDER — ASPIRIN 81 MG PO CHEW
324.0000 mg | CHEWABLE_TABLET | Freq: Every day | ORAL | Status: DC
  Administered 2021-07-09 – 2021-07-10 (×2): 324 mg
  Filled 2021-07-08 (×2): qty 4

## 2021-07-08 MED ORDER — ACETAMINOPHEN 160 MG/5ML PO SOLN
650.0000 mg | Freq: Once | ORAL | Status: AC
  Filled 2021-07-08: qty 20.3

## 2021-07-08 MED ORDER — NOREPINEPHRINE 4 MG/250ML-% IV SOLN
0.0000 ug/min | INTRAVENOUS | Status: DC
  Administered 2021-07-09: 3 ug/min via INTRAVENOUS
  Filled 2021-07-08: qty 250

## 2021-07-08 MED ORDER — CHLORHEXIDINE GLUCONATE 0.12 % MT SOLN
15.0000 mL | OROMUCOSAL | Status: AC
  Administered 2021-07-08: 15 mL via OROMUCOSAL

## 2021-07-08 MED ORDER — MIDAZOLAM HCL (PF) 10 MG/2ML IJ SOLN
INTRAMUSCULAR | Status: AC
  Filled 2021-07-08: qty 2

## 2021-07-08 MED ORDER — FAMOTIDINE IN NACL 20-0.9 MG/50ML-% IV SOLN
20.0000 mg | Freq: Two times a day (BID) | INTRAVENOUS | Status: DC
  Administered 2021-07-08: 20 mg via INTRAVENOUS
  Filled 2021-07-08: qty 50

## 2021-07-08 MED ORDER — LIDOCAINE 2% (20 MG/ML) 5 ML SYRINGE
INTRAMUSCULAR | Status: AC
  Filled 2021-07-08: qty 5

## 2021-07-08 SURGICAL SUPPLY — 86 items
ADH SKN CLS APL DERMABOND .7 (GAUZE/BANDAGES/DRESSINGS) ×5
ATRICLIP EXCLUSION VLAA SYSTEM (Miscellaneous) ×6 IMPLANT
BAG DECANTER FOR FLEXI CONT (MISCELLANEOUS) ×6 IMPLANT
BLADE CLIPPER SURG (BLADE) ×6 IMPLANT
BLADE STERNUM SYSTEM 6 (BLADE) ×6 IMPLANT
BNDG ELASTIC 4X5.8 VLCR STR LF (GAUZE/BANDAGES/DRESSINGS) ×7 IMPLANT
BNDG ELASTIC 6X5.8 VLCR STR LF (GAUZE/BANDAGES/DRESSINGS) ×6 IMPLANT
BNDG GAUZE ELAST 4 BULKY (GAUZE/BANDAGES/DRESSINGS) ×7 IMPLANT
CABLE SURGICAL S-101-97-12 (CABLE) ×6 IMPLANT
CANISTER SUCT 3000ML PPV (MISCELLANEOUS) ×6 IMPLANT
CANNULA MC2 2 STG 29/37 NON-V (CANNULA) ×5 IMPLANT
CANNULA MC2 2 STG 36/46 NON-V (CANNULA) IMPLANT
CANNULA MC2 TWO STAGE (CANNULA) ×6
CANNULA NON VENT 20FR 12 (CANNULA) ×6 IMPLANT
CANNULA NON VENT 22FR 12 (CANNULA) ×1 IMPLANT
CANNULA VENOUS 2 STG 34/46 (CANNULA) ×6
CATH ROBINSON RED A/P 18FR (CATHETERS) ×12 IMPLANT
CLIP VESOCCLUDE MED 24/CT (CLIP) IMPLANT
CLIP VESOCCLUDE SM WIDE 24/CT (CLIP) IMPLANT
CONNECTOR BLAKE 2:1 CARIO BLK (MISCELLANEOUS) ×6 IMPLANT
CONTAINER PROTECT SURGISLUSH (MISCELLANEOUS) ×6 IMPLANT
DERMABOND ADVANCED (GAUZE/BANDAGES/DRESSINGS) ×1
DERMABOND ADVANCED .7 DNX12 (GAUZE/BANDAGES/DRESSINGS) IMPLANT
DRAIN CHANNEL 19F RND (DRAIN) ×17 IMPLANT
DRAPE CARDIOVASCULAR INCISE (DRAPES) ×6
DRAPE SRG 135X102X78XABS (DRAPES) ×5 IMPLANT
DRAPE WARM FLUID 44X44 (DRAPES) ×6 IMPLANT
DRSG AQUACEL AG ADV 3.5X10 (GAUZE/BANDAGES/DRESSINGS) ×6 IMPLANT
DRSG AQUACEL AG ADV 3.5X14 (GAUZE/BANDAGES/DRESSINGS) ×1 IMPLANT
ELECT BLADE 4.0 EZ CLEAN MEGAD (MISCELLANEOUS) ×6
ELECT REM PT RETURN 9FT ADLT (ELECTROSURGICAL) ×12
ELECTRODE BLDE 4.0 EZ CLN MEGD (MISCELLANEOUS) ×5 IMPLANT
ELECTRODE REM PT RTRN 9FT ADLT (ELECTROSURGICAL) ×10 IMPLANT
FELT TEFLON 1X6 (MISCELLANEOUS) ×11 IMPLANT
GAUZE SPONGE 4X4 12PLY STRL (GAUZE/BANDAGES/DRESSINGS) ×12 IMPLANT
GAUZE SPONGE 4X4 12PLY STRL LF (GAUZE/BANDAGES/DRESSINGS) ×3 IMPLANT
GLOVE SURG ENC MOIS LTX SZ7 (GLOVE) ×12 IMPLANT
GLOVE SURG ENC TEXT LTX SZ7.5 (GLOVE) ×12 IMPLANT
GOWN STRL REUS W/ TWL LRG LVL3 (GOWN DISPOSABLE) ×20 IMPLANT
GOWN STRL REUS W/ TWL XL LVL3 (GOWN DISPOSABLE) ×10 IMPLANT
GOWN STRL REUS W/TWL LRG LVL3 (GOWN DISPOSABLE) ×24
GOWN STRL REUS W/TWL XL LVL3 (GOWN DISPOSABLE) ×12
HEMOSTAT POWDER SURGIFOAM 1G (HEMOSTASIS) ×17 IMPLANT
INSERT SUTURE HOLDER (MISCELLANEOUS) ×6 IMPLANT
KIT BASIN OR (CUSTOM PROCEDURE TRAY) ×6 IMPLANT
KIT SUCTION CATH 14FR (SUCTIONS) ×6 IMPLANT
KIT TURNOVER KIT B (KITS) ×6 IMPLANT
KIT VASOVIEW HEMOPRO 2 VH 4000 (KITS) ×6 IMPLANT
LEAD PACING MYOCARDI (MISCELLANEOUS) ×1 IMPLANT
MARKER GRAFT CORONARY BYPASS (MISCELLANEOUS) ×18 IMPLANT
NS IRRIG 1000ML POUR BTL (IV SOLUTION) ×30 IMPLANT
PACK ACCESSORY CANNULA KIT (KITS) ×6 IMPLANT
PACK E OPEN HEART (SUTURE) ×6 IMPLANT
PACK OPEN HEART (CUSTOM PROCEDURE TRAY) ×6 IMPLANT
PAD ARMBOARD 7.5X6 YLW CONV (MISCELLANEOUS) ×12 IMPLANT
PAD ELECT DEFIB RADIOL ZOLL (MISCELLANEOUS) ×6 IMPLANT
PENCIL BUTTON HOLSTER BLD 10FT (ELECTRODE) ×6 IMPLANT
POSITIONER HEAD DONUT 9IN (MISCELLANEOUS) ×6 IMPLANT
PUNCH AORTIC ROTATE 4.0MM (MISCELLANEOUS) ×6 IMPLANT
SET MPS 3-ND DEL (MISCELLANEOUS) ×1 IMPLANT
SPONGE TONSIL 1 RF SGL (DISPOSABLE) ×1 IMPLANT
SUPPORT HEART JANKE-BARRON (MISCELLANEOUS) ×6 IMPLANT
SUT BONE WAX W31G (SUTURE) ×6 IMPLANT
SUT ETHIBOND X763 2 0 SH 1 (SUTURE) ×12 IMPLANT
SUT MNCRL AB 3-0 PS2 18 (SUTURE) ×12 IMPLANT
SUT MNCRL AB 4-0 PS2 18 (SUTURE) ×1 IMPLANT
SUT PDS AB 1 CTX 36 (SUTURE) ×12 IMPLANT
SUT PROLENE 3 0 SH DA (SUTURE) ×1 IMPLANT
SUT PROLENE 4 0 SH DA (SUTURE) ×6 IMPLANT
SUT PROLENE 5 0 C 1 36 (SUTURE) ×18 IMPLANT
SUT PROLENE 7 0 BV 1 (SUTURE) ×3 IMPLANT
SUT PROLENE 7 0 BV1 MDA (SUTURE) ×6 IMPLANT
SUT STEEL STERNAL CCS#1 18IN (SUTURE) ×3 IMPLANT
SUT VIC AB 2-0 CT1 27 (SUTURE) ×12
SUT VIC AB 2-0 CT1 TAPERPNT 27 (SUTURE) IMPLANT
SUT VIC AB 3-0 X1 27 (SUTURE) ×1 IMPLANT
SYSTEM EXCLUSION ATRICLIP VLAA (Miscellaneous) IMPLANT
SYSTEM SAHARA CHEST DRAIN ATS (WOUND CARE) ×6 IMPLANT
TAPE CLOTH SURG 4X10 WHT LF (GAUZE/BANDAGES/DRESSINGS) ×1 IMPLANT
TAPE PAPER 2X10 WHT MICROPORE (GAUZE/BANDAGES/DRESSINGS) ×1 IMPLANT
TOWEL GREEN STERILE (TOWEL DISPOSABLE) ×6 IMPLANT
TOWEL GREEN STERILE FF (TOWEL DISPOSABLE) ×6 IMPLANT
TRAY FOLEY SLVR 16FR TEMP STAT (SET/KITS/TRAYS/PACK) ×6 IMPLANT
TUBING LAP HI FLOW INSUFFLATIO (TUBING) ×6 IMPLANT
UNDERPAD 30X36 HEAVY ABSORB (UNDERPADS AND DIAPERS) ×6 IMPLANT
WATER STERILE IRR 1000ML POUR (IV SOLUTION) ×12 IMPLANT
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
## 2021-07-08 NOTE — Op Note (Signed)
[address]       [place] [address]             [phone]                                          07/08/2021 Patient:  [name] Pre-Op Dx: NSTEMI   Three-vessel coronary artery disease   Atrial fibrillation   Congestive heart failure   Acute kidney injury   Diabetes mellitus   Obesity Post-op Dx: Same Procedure: CABG X 4.  LIMA to LAD, reverse saphenous vein graft to diagonal, reverse saphenous vein graft to PDA, left radial artery to obtuse marginal Endoscopic greater saphenous vein harvest on the right Open harvest of the left radial artery Left sided maze procedure with box lesion set 40 mm atrial clip placement   Surgeon and Role:      * Alizzon Dioguardi, Lucile Crater, MD - Primary    Evonnie Pat- assisting for vein harvesting Assistant: Macarthur Critchley, PA-C for left radial artery harvest Anesthesia  general EBL: 800 ml Blood Administration: None Xclamp Time:  54 min Pump Time:  136 min  Drains: 19 F blake drain: L, mediastinal  Wires: Ventricular Counts: correct   Indications: This is a 55 year old gentleman that was admitted with chest pain.  He was ruled in for an NSTEMI.  He underwent PCI to the culprit diagonal lesion, but was noted to have severe three-vessel coronary artery disease.  Surgery was consulted and he was taken to the operating theater for revascularization.  Findings: Preoperative TEE showed that the patient had an EF of 20 to 25%.  After the box lesion set was performed the patient converted to sinus rhythm.  All of his coronaries are heavily calcified.  The radial artery, left internal thoracic artery, and vein were all good conduits.  Separated from cardiopulmonary bypass without event.  Operative Technique: All invasive lines were placed in pre-op holding.  After the risks, benefits and alternatives were thoroughly discussed, the patient was brought to the operative theatre.  Anesthesia was induced, and the patient was  prepped and draped in normal sterile fashion.  An appropriate surgical pause was performed, and pre-operative antibiotics were dosed accordingly.  We began with simultaneous incisions along the right leg for harvesting of the greater saphenous vein, the left forearm for harvesting of the radial artery and the chest for the sternotomy.  In regards to the sternotomy, this was carried down with bovie cautery, and the sternum was divided with a reciprocating saw.  Meticulous hemostasis was obtained.  The left internal thoracic artery was exposed and harvested in in pedicled fashion.  The patient was systemically heparinized, and the artery was divided distally, and placed in a papaverine sponge.    The sternal elevator was removed, and a retractor was placed.  The pericardium was divided in the midline and fashioned into a cradle with pericardial stitches.   After we confirmed an appropriate ACT, the ascending aorta was cannulated in standard fashion.  The right atrial appendage was used for venous cannulation site.  Cardiopulmonary bypass was initiated, and a window was created under the inferior vena cava, and superior vena cava.  We passed the red rubber catheters through the oblique and transverse sinuses and then use this to guide our atrial clamp to perform a box lesion set.  3 separate lesions  were created.  At the completion of this the patient did convert spontaneously to sinus rhythm.  Next left atrial appendage was measured to 40 mm atrial clip.    The heart retractor was placed. The cross clamp was applied, and a dose of anterograde cardioplegia was given with good arrest of the heart.  We moved to the posterior wall of the heart, and found a good target on the PDA.  An arteriotomy was made, and the vein graft was anastomosed to it in an end to side fashion.  Next we exposed the lateral wall, and found a good target on the obtuse marginal.  An end to side anastomosis with the radial artery graft was  then created.  Next, we exposed the anterior wall of the heart and identified a good target on diagonal.   An arteriotomy was created.  The vein was anastomosed in an end to side fashion.  Finally, we exposed a good target on the LAD, and fashioned an end to side anastomosis between it and the LITA.  We began to re-warm, and a re-animation dose of cardioplegia was given.  The heart was de-aired, and the cross clamp was removed.  Meticulous hemostasis was obtained.    A partial occludding clamp was then placed on the ascending aorta, and we created an end to side anastomosis between it and the proximal vein grafts.  The proximal sites were marked with rings.  The radial artery was jumped off the hood of the vein graft.  Hemostasis was obtained, and we separated from cardiopulmonary bypass without event.  The heparin was reversed with protamine.  Chest tubes and wires were placed, and the sternum was re-approximated with sternal wires.  The soft tissue and skin were re-approximated wth absorbable suture.    The patient tolerated the procedure without any immediate complications, and was transferred to the ICU in guarded condition.  Meerab Maselli [name]

## 2021-07-08 NOTE — Progress Notes (Signed)
Patient acidotic on ABG at end of wean protocol, see Results Review. Patient passed other extubation parameters per RT. MD notified of ABG results as well as minimal UOP. Received verbal order to give an amp of sodium bicarb and restart weaning protocol as patient is not yet ready to be extubated. Will continue to monitor throughout shift.

## 2021-07-08 NOTE — Anesthesia Procedure Notes (Signed)
Procedure Name: Intubation Date/Time: 07/08/2021 11:36 AM Performed by: Jearld Pies, CRNA Pre-anesthesia Checklist: Patient identified, Emergency Drugs available, Suction available and Patient being monitored Patient Re-evaluated:Patient Re-evaluated prior to induction Oxygen Delivery Method: Circle System Utilized Preoxygenation: Pre-oxygenation with 100% oxygen Induction Type: IV induction Ventilation: Mask ventilation without difficulty and Oral airway inserted - appropriate to patient size Laryngoscope Size: Sabra Heck and 2 Grade View: Grade I Tube type: Oral Tube size: 8.0 mm Number of attempts: 1 Airway Equipment and Method: Stylet and Oral airway Placement Confirmation: ETT inserted through vocal cords under direct vision, positive ETCO2 and breath sounds checked- equal and bilateral Secured at: 24 cm Tube secured with: Tape Dental Injury: Teeth and Oropharynx as per pre-operative assessment

## 2021-07-08 NOTE — Progress Notes (Signed)
Progress Note  Patient Name: Billy [name] Date of Encounter: 07/08/2021  [facility] Cardiologist: None   Subjective   Doing okay this morning.  He is able to feel his irregular heartbeat but denies any chest pain or shortness of breath.  Inpatient Medications    Scheduled Meds:  amLODipine  5 mg Oral Daily   aspirin  81 mg Oral Daily   atorvastatin  80 mg Oral Daily   bisacodyl  5 mg Oral Once   chlorhexidine       [START ON 07/09/2021] chlorhexidine  15 mL Mouth/Throat Once   Chlorhexidine Gluconate Cloth  6 each Topical Daily   Chlorhexidine Gluconate Cloth  6 each Topical Once   And   Chlorhexidine Gluconate Cloth  6 each Topical Once   epinephrine  0-10 mcg/min Intravenous To OR   gabapentin  400 mg Oral QHS   heparin-papaverine-plasmalyte irrigation   Irrigation To OR   icosapent Ethyl  2 g Oral BID   insulin aspart  0-15 Units Subcutaneous TID WC   insulin   Intravenous To OR   [place] Blood Cardioplegia vial (lidocaine/magnesium/mannitol 0.26g-4g-6.4g)   Intracoronary To OR   metoprolol tartrate  12.5 mg Oral Once   phenylephrine  30-200 mcg/min Intravenous To OR   potassium chloride  80 mEq Other To OR   ramipril  20 mg Oral Daily   sodium chloride flush  3 mL Intravenous Q12H   tranexamic acid  15 mg/kg (Ideal) Intravenous To OR   tranexamic acid  2 mg/kg Intracatheter To OR   traZODone  75 mg Oral QHS   Continuous Infusions:  sodium chloride      ceFAZolin (ANCEF) IV      ceFAZolin (ANCEF) IV     dexmedetomidine     heparin 30,000 units/NS 1000 mL solution for CELLSAVER     milrinone     nitroGLYCERIN     norepinephrine     tirofiban Stopped (07/08/21 0830)   tranexamic acid (CYKLOKAPRON) infusion ([facility])     vancomycin     PRN Meds: sodium chloride, acetaminophen, alprazolam, nitroGLYCERIN, ondansetron (ZOFRAN) IV, sodium chloride flush, temazepam   Vital Signs    Vitals:   07/08/21 0500 07/08/21 0600 07/08/21 0700 07/08/21 0800  BP:  107/71 129/90 104/73 110/79  Pulse: 95 90 82 77  Resp: (!) 22 (!) 21 (!) 21 13  Temp:      TempSrc:      SpO2: 95% 93% 96% 96%  Weight:      Height:        Intake/Output Summary (Last 24 hours) at 07/08/2021 1027 Last data filed at 07/08/2021 0600 Gross per 24 hour  Intake 1095.7 ml  Output 900 ml  Net 195.7 ml   Last 3 Weights 07/07/2021 07/07/2021 06/30/2021  Weight (lbs) 255 lb 255 lb 255 lb  Weight (kg) 115.667 kg 115.667 kg 115.667 kg      Telemetry    Atrial fibrillation, heart rate 100 to 115 bpm- Personally Reviewed  Physical Exam  Alert, oriented, no distress GEN: No acute distress.   Neck: No JVD Cardiac: Irregularly irregular, no murmurs, rubs, or gallops.  Respiratory: Clear to auscultation bilaterally. GI: Soft, nontender, non-distended  MS: No edema; No deformity.  Right radial cath site is clear Neuro:  Nonfocal  Psych: Normal affect   Labs    High Sensitivity Troponin:   Recent Labs  Lab 06/24/21 2310 06/25/21 0300 07/07/21 1353 07/07/21 1539  TROPONINIHS 38* 44* 22,997*  32,440*     Chemistry Recent Labs  Lab 07/07/21 1353 07/08/21 0753  NA 137 136  K 4.4 4.5  CL 105 106  CO2 20* 23  GLUCOSE 229* 172*  BUN 20 20  CREATININE 1.92* 1.99*  CALCIUM 8.7* 8.2*  MG  --  1.8  PROT 6.1*  --   ALBUMIN 3.3*  --   AST 113*  --   ALT 24  --   ALKPHOS 38  --   BILITOT 1.0  --   GFRNONAA 40* 39*  ANIONGAP 12 7    Lipids  Recent Labs  Lab 07/07/21 1353  CHOL 208*  TRIG 212*  HDL 35*  LDLCALC 131*  CHOLHDL 5.9    Hematology Recent Labs  Lab 07/07/21 1353 07/08/21 0115 07/08/21 0753  WBC 9.6 7.9 7.7  RBC 4.38 4.15* 4.25  HGB 14.0 12.7* 13.6  HCT 40.6 38.6* 38.8*  MCV 92.7 93.0 91.3  MCH 32.0 30.6 32.0  MCHC 34.5 32.9 35.1  RDW 12.0 12.2 12.3  PLT 205 192 192   Thyroid No results for input(s): TSH, FREET4 in the last 168 hours.  BNPNo results for input(s): BNP, PROBNP in the last 168 hours.  DDimer No results for  input(s): DDIMER in the last 168 hours.   Radiology    CARDIAC CATHETERIZATION  Result Date: 07/07/2021   1st Diag-2 lesion is 100% stenosed.   1st Diag-1 lesion is 90% stenosed.   Prox LAD lesion is 65% stenosed.   Mid LAD lesion is 50% stenosed.   Dist RCA lesion is 80% stenosed.   Dist Cx lesion is 80% stenosed.   A drug-eluting stent was successfully placed using a STENT ONYX FRONTIER 2.5X15.   Post intervention, there is a 0% residual stenosis.   LV end diastolic pressure is normal.  Acute occluded first diagonal treated with one drug-eluting stent; the patient will be maintained on Aggrastat until a revascularization strategy is devised for residual multivessel disease with long segment LAD involvement.  LVEDP 11mmHg. The results were discussed with Dr. Burt Knack.   DG Chest Port 1 View  Result Date: 07/07/2021 CLINICAL DATA:  STEMI, chest pressure, history diabetes mellitus, hypertension EXAM: PORTABLE CHEST 1 VIEW COMPARISON:  Portable exam 1434 hours compared to 06/24/2021 FINDINGS: Upper normal heart size. Mediastinal contours and pulmonary vascularity normal. Lungs clear. No acute infiltrate, pleural effusion, or pneumothorax. Osseous structures unremarkable IMPRESSION: No acute abnormalities. Electronically Signed   By: Lavonia Dana M.D.   On: 07/07/2021 15:11   MYOCARDIAL PERFUSION IMAGING  Result Date: 07/07/2021   Findings are consistent with prior myocardial infarction. The study is high risk.   ST elevation in the lateral and anterior leads was noted.   LV perfusion is abnormal. Defect 1: There is a large defect with severe reduction in uptake present in the apical to mid anterior location(s) that is fixed. Consistent with infarction.   Prior study not available for comparison. Read as a rest only study. Immediately following stress testing due to ECG changes and symptoms, patient was evaluated by DOD and a Code STEMI was called.  Patient was transported to hospital via EMS for emergent  cath. On rest images, there is lack of uptake in the mid to apical anterior wall. This is suggestive of infarction. ECG also shows anterolateral ST elevation after lexiscan, with progression to afib RVR. Results reported to Dr. Burt Knack, who has seen patient in ER.    Cardiac Studies   Cardiac cath reviewed as above  Patient Profile     55 y.o. male with anterolateral STEMI secondary to diagonal occlusion, found to have severe multivessel coronary artery disease for planned urgent CABG 07/08/2021  Assessment & Plan    1.  Anterolateral STEMI: Status post primary PCI of the diagonal branch with planned surgical revascularization for treatment of residual severe three-vessel coronary artery disease involving the LAD, left circumflex, and RCA. 2.  Persistent atrial fibrillation: Patient to undergo surgical Maze procedure and left atrial appendage clipping today.  If he is willing to take oral anticoagulation following surgery, this will be indicated 3.  Hypertension: We will adjust post MI medical therapy following CABG  The patient appears medically stable to go forward with CABG this afternoon early after his lateral wall infarct in the setting of severe multivessel coronary artery disease.  Appreciate Dr. Abran Duke evaluation and plans for surgery.  The patient's 2 sisters are at the bedside.  All of their questions are answered to the best of my ability today.  For questions or updates, please contact [facility] Please consult [email or web address] for contact info under        Signed, Sherren Mocha, MD  07/08/2021, 10:27 AM

## 2021-07-08 NOTE — Anesthesia Postprocedure Evaluation (Signed)
Anesthesia Post Note  Patient: Billy [name]  Procedure(s) Performed: CORONARY ARTERY BYPASS GRAFTING (CABG) TIMES FOUR USING LEFT INTERNAL MAMMARY ARTERY, GREATER SAPHENOUS VEIN HARVESTED ENDOSCOPICALLY, AND LEFT RADIAL ARTERY HARVESTED OPEN (Chest) TRANSESOPHAGEAL ECHOCARDIOGRAM (TEE) RADIAL ARTERY HARVEST (Left: Arm Lower) ENDOVEIN HARVEST OF GREATER SAPHENOUS VEIN (Right: Leg Upper) MAZE (Chest)     Patient location during evaluation: SICU Anesthesia Type: General Level of consciousness: sedated and patient remains intubated per anesthesia plan Pain management: pain level controlled Vital Signs Assessment: post-procedure vital signs reviewed and stable Respiratory status: patient remains intubated per anesthesia plan and patient on ventilator - see flowsheet for VS Cardiovascular status: stable (requiring low dose Norepi infusion ) Postop Assessment: no apparent nausea or vomiting Anesthetic complications: no Comments: Urine output remains low   No notable events documented.  Last Vitals:  Vitals:   07/08/21 2000 07/08/21 2100  BP: 98/73   Pulse: 60 70  Resp:  11  Temp: 36.8 C 36.8 C  SpO2: 100% 100%    Last Pain:  Vitals:   07/08/21 1049  TempSrc: Oral  PainSc:                  [name],E. Mecca Guitron

## 2021-07-08 NOTE — Anesthesia Procedure Notes (Signed)
Arterial Line Insertion Start/End10/25/2022 11:45 AM, 07/08/2021 11:50 AM Performed by: Jearld Pies, CRNA, CRNA  Patient location: Pre-op. Preanesthetic checklist: patient identified, IV checked, site marked, risks and benefits discussed, surgical consent, monitors and equipment checked, pre-op evaluation, timeout performed and anesthesia consent Right, radial was placed Catheter size: 20 G Hand hygiene performed , maximum sterile barriers used  and Seldinger technique used Allen's test indicative of satisfactory collateral circulation Attempts: 1 Procedure performed without using ultrasound guided technique. Following insertion, Biopatch and dressing applied. Post procedure assessment: normal  Patient tolerated the procedure well with no immediate complications.

## 2021-07-08 NOTE — Progress Notes (Addendum)
RT note. Patient able to achieve 1.40mL - VC, -30 nif. Patient has been sating 91-94%, abg collected, patient does have cuff leak. MD notified, RT will continue to monitor. Patient returned to previous settings per protocol/abg results.

## 2021-07-08 NOTE — Transfer of Care (Signed)
Immediate Anesthesia Transfer of Care Note  Patient: Billy [name]  Procedure(s) Performed: CORONARY ARTERY BYPASS GRAFTING (CABG) TIMES FOUR USING LEFT INTERNAL MAMMARY ARTERY, GREATER SAPHENOUS VEIN HARVESTED ENDOSCOPICALLY, AND LEFT RADIAL ARTERY HARVESTED OPEN (Chest) TRANSESOPHAGEAL ECHOCARDIOGRAM (TEE) RADIAL ARTERY HARVEST (Left: Arm Lower) ENDOVEIN HARVEST OF GREATER SAPHENOUS VEIN (Right: Leg Upper) MAZE (Chest)  Patient Location: ICU  Anesthesia Type:General  Level of Consciousness: Patient remains intubated per anesthesia plan  Airway & Oxygen Therapy: Patient remains intubated per anesthesia plan and Patient placed on Ventilator (see vital sign flow sheet for setting)  Post-op Assessment: Report given to RN and Post -op Vital signs reviewed and stable  Post vital signs: Reviewed and stable  Last Vitals:  Vitals Value Taken Time  BP 99/55 (69) - ABP   Temp 36.9 C 07/08/21 1733  Pulse 69 07/08/21 1732  Resp 15   SpO2 100 % 07/08/21 1732  Vitals shown include unvalidated device data.  Last Pain:  Vitals:   07/08/21 1049  TempSrc: Oral  PainSc:      Report to Nicki Reaper RN in ICU, amiodarone, TXA, precedex and insulin infusing, full Anesthesia to ICU report sheet utilized during report, all questions answered, transfer of care in stable and safe condition.    Complications: No notable events documented.

## 2021-07-08 NOTE — Brief Op Note (Signed)
07/07/2021 - 07/08/2021  11:50 AM  PATIENT:  Billy [name]  55 y.o. male  PRE-OPERATIVE DIAGNOSIS:  coronary artery disease  POST-OPERATIVE DIAGNOSIS:  coronary artery disease  PROCEDURE:  Procedure(s) with comments: CORONARY ARTERY BYPASS GRAFTING (CABG) TIMES FOUR USING LEFT INTERNAL MAMMARY ARTERY, GREATER SAPHENOUS VEIN HARVESTED ENDOSCOPICALLY, AND LEFT RADIAL ARTERY HARVESTED OPEN (N/A) TRANSESOPHAGEAL ECHOCARDIOGRAM (TEE) (N/A) RADIAL ARTERY HARVEST (Left) ENDOVEIN HARVEST OF GREATER SAPHENOUS VEIN (Right) MAZE - ablation only [place] LEFT RADIAL -OM SVG-PD SVG-DIAG EVH 45 MIN RADIAL 45 MIN  SURGEON:  Surgeon(s) and Role:    * Lightfoot, Lucile Crater, MD - Primary  PHYSICIAN ASSISTANT: Evett Kassa PA-C, MYRON RODDENBERRY PA-C  ASSISTANTS: STAFF   ANESTHESIA:   general  EBL:  800 mL   BLOOD ADMINISTERED:none  DRAINS:  LEFT PLEURAL AND MEDIASTINAL CHEST TUBES    LOCAL MEDICATIONS USED:  NONE  SPECIMEN:  No Specimen  DISPOSITION OF SPECIMEN:  N/A  COUNTS:  YES  TOURNIQUET:  * No tourniquets in log *  DICTATION: .Dragon Dictation  PLAN OF CARE: Admit to inpatient   PATIENT DISPOSITION:  ICU - intubated and hemodynamically stable.   Delay start of Pharmacological VTE agent (>24hrs) due to surgical blood loss or risk of bleeding: yes COMPLICATIONS: NO KNOWN

## 2021-07-08 NOTE — Anesthesia Preprocedure Evaluation (Addendum)
Anesthesia Evaluation  Patient identified by MRN, date of birth, ID band Patient awake    Reviewed: Allergy & Precautions, NPO status , Patient's Chart, lab work & pertinent test results, reviewed documented beta blocker date and time   History of Anesthesia Complications Negative for: history of anesthetic complications  Airway Mallampati: II  TM Distance: >3 FB Neck ROM: Full    Dental  (+) Dental Advisory Given, Chipped   Pulmonary  07/07/2021 SARS coronavirus NEG   breath sounds clear to auscultation       Cardiovascular hypertension, Pt. on medications and Pt. on home beta blockers + angina + CAD and + Past MI  + dysrhythmias Atrial Fibrillation and Supra Ventricular Tachycardia  Rhythm:Regular Rate:Normal  07/07/2021 Cath:     1st Diag-2 lesion is 100% stenosed. .  1st Diag-1 lesion is 90% stenosed. .  Prox LAD lesion is 65% stenosed. .  Mid LAD lesion is 50% stenosed. .  Dist RCA lesion is 80% stenosed. .  Dist Cx lesion is 80% stenosed. .  A drug-eluting stent was successfully placed using a STENT ONYX FRONTIER 2.5X15. Marland Kitchen  Post intervention, there is a 0% residual stenosis. .  LV end diastolic pressure is normal.  07/07/2021 Stress: a large defect with severe reduction in uptake present in the apical to mid anterior location(s) that is fixed   Neuro/Psych Anxiety Depression negative neurological ROS     GI/Hepatic Neg liver ROS, GERD  Controlled,  Endo/Other  diabetes (glu 170), Insulin Dependent, Oral Hypoglycemic Agentsobese  Renal/GU Renal InsufficiencyRenal disease     Musculoskeletal   Abdominal (+) + obese,   Peds  Hematology negative hematology ROS (+)   Anesthesia Other Findings   Reproductive/Obstetrics                            Anesthesia Physical Anesthesia Plan  ASA: 4  Anesthesia Plan: General   Post-op Pain Management:    Induction: Intravenous  PONV  Risk Score and Plan: 2 and Treatment may vary due to age or medical condition  Airway Management Planned: Oral ETT  Additional Equipment: Arterial line, CVP, TEE and Ultrasound Guidance Line Placement  Intra-op Plan:   Post-operative Plan: Post-operative intubation/ventilation  Informed Consent: I have reviewed the patients History and Physical, chart, labs and discussed the procedure including the risks, benefits and alternatives for the proposed anesthesia with the patient or authorized representative who has indicated his/her understanding and acceptance.     Dental advisory given  Plan Discussed with: CRNA and Surgeon  Anesthesia Plan Comments:        Anesthesia Quick Evaluation

## 2021-07-08 NOTE — TOC Benefit Eligibility Note (Signed)
Patient Teacher, English as a foreign language completed.    The patient is currently admitted and upon discharge could be taking Brilinta 90 mg.  The current 30 day co-pay is, $9.85.   The patient is currently admitted and upon discharge could be taking Trulicity pens  The current 30 day co-pay is, $9.85.   The patient is currently admitted and upon discharge could be taking Ozempic 0.25 mg pens.  The current 30 day co-pay is, $9.85.   The patient is currently admitted and upon discharge could be taking Farxiga 10 mg.  The current 30 day co-pay is, $9.85.   The patient is currently admitted and upon discharge could be taking Jardiance 10 mg.  The current 30 day co-pay is, $9.85.   The patient is insured through [employer] [employer], [place] Patient Advocate Specialist [place] Team Direct Number: [phone]  Fax: [phone]

## 2021-07-08 NOTE — Progress Notes (Signed)
  Echocardiogram Echocardiogram Transesophageal has been performed.  Billy [name] 07/08/2021, 12:37 PM

## 2021-07-08 NOTE — Progress Notes (Signed)
      [address]       [place] [address]             [phone]      S/p CABG, Maze, LAA clip  Intubated, sedated BP (!) 143/86   Pulse 87   Temp 97.8 F (36.6 C) (Oral)   Resp 20   Ht 6\' 2"  (1.88 m)   Wt 115.7 kg   SpO2 100%   BMI 32.75 kg/m  CI= 2.1 On amiodarone  Intake/Output Summary (Last 24 hours) at 07/08/2021 1753 Last data filed at 07/08/2021 1633 Gross per 24 hour  Intake 4595.7 ml  Output 2250 ml  Net 2345.7 ml   Minimal CT output Hct 32  Doing well early postop  Remo Lipps C. Roxan Hockey, MD [facility] [phone]

## 2021-07-08 NOTE — Anesthesia Procedure Notes (Signed)
Central Venous Catheter Insertion Performed by: Annye Asa, MD, anesthesiologist Start/End10/25/2022 10:51 AM, 07/08/2021 11:03 AM Patient location: Pre-op. Preanesthetic checklist: patient identified, IV checked, site marked, risks and benefits discussed, surgical consent, monitors and equipment checked, pre-op evaluation, timeout performed and anesthesia consent Position: supine Lidocaine 1% used for infiltration and patient sedated Hand hygiene performed , maximum sterile barriers used  and Seldinger technique used Catheter size: 8.5 Fr Sheath introducer Procedure performed using ultrasound guided technique. Ultrasound Notes:anatomy identified, needle tip was noted to be adjacent to the nerve/plexus identified and image(s) printed for medical record Attempts: 1 Following insertion, line sutured, dressing applied and Biopatch. Post procedure assessment: blood return through all ports, free fluid flow and no air  Patient tolerated the procedure well with no immediate complications. Additional procedure comments: CVP: Timeout, sterile prep, drape, FBP R neck.  Supine position.  1% lido local, finder and trocar RIJ 1st pass with US guidance.  Cordis introducer placed over J wire and 3 lumen catheter inserted through the introducer. Biopatch and sterile dressing on.  Patient tolerated well.  VSS.  Jenita Seashore, MD .

## 2021-07-09 ENCOUNTER — Inpatient Hospital Stay (HOSPITAL_COMMUNITY): Payer: Medicare HMO

## 2021-07-09 ENCOUNTER — Encounter (HOSPITAL_COMMUNITY): Payer: Self-pay | Admitting: Thoracic Surgery (Cardiothoracic Vascular Surgery)

## 2021-07-09 DIAGNOSIS — N179 Acute kidney failure, unspecified: Secondary | ICD-10-CM | POA: Diagnosis not present

## 2021-07-09 DIAGNOSIS — I5043 Acute on chronic combined systolic (congestive) and diastolic (congestive) heart failure: Secondary | ICD-10-CM

## 2021-07-09 DIAGNOSIS — J9601 Acute respiratory failure with hypoxia: Secondary | ICD-10-CM

## 2021-07-09 DIAGNOSIS — I2109 ST elevation (STEMI) myocardial infarction involving other coronary artery of anterior wall: Secondary | ICD-10-CM | POA: Diagnosis not present

## 2021-07-09 DIAGNOSIS — R570 Cardiogenic shock: Secondary | ICD-10-CM | POA: Diagnosis not present

## 2021-07-09 DIAGNOSIS — J90 Pleural effusion, not elsewhere classified: Secondary | ICD-10-CM | POA: Diagnosis not present

## 2021-07-09 DIAGNOSIS — I213 ST elevation (STEMI) myocardial infarction of unspecified site: Secondary | ICD-10-CM | POA: Diagnosis not present

## 2021-07-09 LAB — BASIC METABOLIC PANEL
Anion gap: 6 (ref 5–15)
Anion gap: 11 (ref 5–15)
Anion gap: 10 (ref 5–15)
BUN: 24 mg/dL — ABNORMAL HIGH (ref 6–20)
BUN: 32 mg/dL — ABNORMAL HIGH (ref 6–20)
BUN: 33 mg/dL — ABNORMAL HIGH (ref 6–20)
CO2: 21 mmol/L — ABNORMAL LOW (ref 22–32)
CO2: 18 mmol/L — ABNORMAL LOW (ref 22–32)
CO2: 20 mmol/L — ABNORMAL LOW (ref 22–32)
Calcium: 7.7 mg/dL — ABNORMAL LOW (ref 8.9–10.3)
Calcium: 7.5 mg/dL — ABNORMAL LOW (ref 8.9–10.3)
Calcium: 7 mg/dL — ABNORMAL LOW (ref 8.9–10.3)
Chloride: 111 mmol/L (ref 98–111)
Chloride: 106 mmol/L (ref 98–111)
Chloride: 104 mmol/L (ref 98–111)
Creatinine, Ser: 3.07 mg/dL — ABNORMAL HIGH (ref 0.61–1.24)
Creatinine, Ser: 4.41 mg/dL — ABNORMAL HIGH (ref 0.61–1.24)
Creatinine, Ser: 4.7 mg/dL — ABNORMAL HIGH (ref 0.61–1.24)
GFR, Estimated: 23 mL/min — ABNORMAL LOW (ref >60)
GFR, Estimated: 15 mL/min — ABNORMAL LOW (ref >60)
GFR, Estimated: 14 mL/min — ABNORMAL LOW (ref >60)
Glucose, Bld: 151 mg/dL — ABNORMAL HIGH (ref 70–99)
Glucose, Bld: 300 mg/dL — ABNORMAL HIGH (ref 70–99)
Glucose, Bld: 331 mg/dL — ABNORMAL HIGH (ref 70–99)
Potassium: 5 mmol/L (ref 3.5–5.1)
Potassium: 5.3 mmol/L — ABNORMAL HIGH (ref 3.5–5.1)
Potassium: 4.7 mmol/L (ref 3.5–5.1)
Sodium: 138 mmol/L (ref 135–145)
Sodium: 135 mmol/L (ref 135–145)
Sodium: 134 mmol/L — ABNORMAL LOW (ref 135–145)

## 2021-07-09 LAB — MAGNESIUM
Magnesium: 2.7 mg/dL — ABNORMAL HIGH (ref 1.7–2.4)
Magnesium: 2.5 mg/dL — ABNORMAL HIGH (ref 1.7–2.4)

## 2021-07-09 LAB — GLUCOSE, CAPILLARY
Glucose-Capillary: 102 mg/dL — ABNORMAL HIGH (ref 70–99)
Glucose-Capillary: 112 mg/dL — ABNORMAL HIGH (ref 70–99)
Glucose-Capillary: 118 mg/dL — ABNORMAL HIGH (ref 70–99)
Glucose-Capillary: 130 mg/dL — ABNORMAL HIGH (ref 70–99)
Glucose-Capillary: 136 mg/dL — ABNORMAL HIGH (ref 70–99)
Glucose-Capillary: 137 mg/dL — ABNORMAL HIGH (ref 70–99)
Glucose-Capillary: 147 mg/dL — ABNORMAL HIGH (ref 70–99)
Glucose-Capillary: 155 mg/dL — ABNORMAL HIGH (ref 70–99)
Glucose-Capillary: 160 mg/dL — ABNORMAL HIGH (ref 70–99)
Glucose-Capillary: 163 mg/dL — ABNORMAL HIGH (ref 70–99)
Glucose-Capillary: 166 mg/dL — ABNORMAL HIGH (ref 70–99)
Glucose-Capillary: 178 mg/dL — ABNORMAL HIGH (ref 70–99)
Glucose-Capillary: 218 mg/dL — ABNORMAL HIGH (ref 70–99)
Glucose-Capillary: 255 mg/dL — ABNORMAL HIGH (ref 70–99)
Glucose-Capillary: 288 mg/dL — ABNORMAL HIGH (ref 70–99)
Glucose-Capillary: 320 mg/dL — ABNORMAL HIGH (ref 70–99)

## 2021-07-09 LAB — CBC
HCT: 31 % — ABNORMAL LOW (ref 39.0–52.0)
HCT: 27.4 % — ABNORMAL LOW (ref 39.0–52.0)
HCT: 27.6 % — ABNORMAL LOW (ref 39.0–52.0)
Hemoglobin: 10.5 g/dL — ABNORMAL LOW (ref 13.0–17.0)
Hemoglobin: 9.1 g/dL — ABNORMAL LOW (ref 13.0–17.0)
Hemoglobin: 9.3 g/dL — ABNORMAL LOW (ref 13.0–17.0)
MCH: 31.3 pg (ref 26.0–34.0)
MCH: 31.8 pg (ref 26.0–34.0)
MCH: 31.5 pg (ref 26.0–34.0)
MCHC: 33.9 g/dL (ref 30.0–36.0)
MCHC: 33.2 g/dL (ref 30.0–36.0)
MCHC: 33.7 g/dL (ref 30.0–36.0)
MCV: 92.5 fL (ref 80.0–100.0)
MCV: 95.8 fL (ref 80.0–100.0)
MCV: 93.6 fL (ref 80.0–100.0)
Platelets: 183 10*3/uL (ref 150–400)
Platelets: 181 10*3/uL (ref 150–400)
Platelets: 234 10*3/uL (ref 150–400)
RBC: 3.35 MIL/uL — ABNORMAL LOW (ref 4.22–5.81)
RBC: 2.86 MIL/uL — ABNORMAL LOW (ref 4.22–5.81)
RBC: 2.95 MIL/uL — ABNORMAL LOW (ref 4.22–5.81)
RDW: 12.5 % (ref 11.5–15.5)
RDW: 12.8 % (ref 11.5–15.5)
RDW: 12.6 % (ref 11.5–15.5)
WBC: 12.4 10*3/uL — ABNORMAL HIGH (ref 4.0–10.5)
WBC: 15.6 10*3/uL — ABNORMAL HIGH (ref 4.0–10.5)
WBC: 19.1 10*3/uL — ABNORMAL HIGH (ref 4.0–10.5)
nRBC: 0 % (ref 0.0–0.2)
nRBC: 0 % (ref 0.0–0.2)
nRBC: 0 % (ref 0.0–0.2)

## 2021-07-09 LAB — POCT I-STAT 7, (LYTES, BLD GAS, ICA,H+H)
Acid-base deficit: 6 mmol/L — ABNORMAL HIGH (ref 0.0–2.0)
Acid-base deficit: 5 mmol/L — ABNORMAL HIGH (ref 0.0–2.0)
Bicarbonate: 19.1 mmol/L — ABNORMAL LOW (ref 20.0–28.0)
Bicarbonate: 20.1 mmol/L (ref 20.0–28.0)
Calcium, Ion: 1.14 mmol/L — ABNORMAL LOW (ref 1.15–1.40)
Calcium, Ion: 1.13 mmol/L — ABNORMAL LOW (ref 1.15–1.40)
HCT: 28 % — ABNORMAL LOW (ref 39.0–52.0)
HCT: 27 % — ABNORMAL LOW (ref 39.0–52.0)
Hemoglobin: 9.5 g/dL — ABNORMAL LOW (ref 13.0–17.0)
Hemoglobin: 9.2 g/dL — ABNORMAL LOW (ref 13.0–17.0)
O2 Saturation: 91 %
O2 Saturation: 94 %
Patient temperature: 37.7
Patient temperature: 38
Potassium: 4.6 mmol/L (ref 3.5–5.1)
Potassium: 4.8 mmol/L (ref 3.5–5.1)
Sodium: 140 mmol/L (ref 135–145)
Sodium: 140 mmol/L (ref 135–145)
TCO2: 20 mmol/L — ABNORMAL LOW (ref 22–32)
TCO2: 21 mmol/L — ABNORMAL LOW (ref 22–32)
pCO2 arterial: 35.9 mmHg (ref 32.0–48.0)
pCO2 arterial: 39.8 mmHg (ref 32.0–48.0)
pH, Arterial: 7.338 — ABNORMAL LOW (ref 7.350–7.450)
pH, Arterial: 7.315 — ABNORMAL LOW (ref 7.350–7.450)
pO2, Arterial: 66 mmHg — ABNORMAL LOW (ref 83.0–108.0)
pO2, Arterial: 80 mmHg — ABNORMAL LOW (ref 83.0–108.0)

## 2021-07-09 LAB — COOXEMETRY PANEL
Carboxyhemoglobin: 0.7 % (ref 0.5–1.5)
Methemoglobin: 1.1 % (ref 0.0–1.5)
O2 Saturation: 56.4 %
Total hemoglobin: 9.3 g/dL — ABNORMAL LOW (ref 12.0–16.0)

## 2021-07-09 MED ORDER — STERILE WATER FOR INJECTION IV SOLN
INTRAVENOUS | Status: DC
  Filled 2021-07-09 (×2): qty 1000

## 2021-07-09 MED ORDER — INSULIN ASPART 100 UNIT/ML IJ SOLN
0.0000 [IU] | INTRAMUSCULAR | Status: DC
Start: 2021-07-09 — End: 2021-07-09
  Administered 2021-07-09: 16 [IU] via SUBCUTANEOUS
  Administered 2021-07-09: 2 [IU] via SUBCUTANEOUS
  Administered 2021-07-09: 4 [IU] via SUBCUTANEOUS

## 2021-07-09 MED ORDER — ALPRAZOLAM 0.5 MG PO TABS
2.0000 mg | ORAL_TABLET | Freq: Three times a day (TID) | ORAL | Status: DC | PRN
  Administered 2021-07-09 – 2021-07-11 (×3): 2 mg via ORAL
  Filled 2021-07-09 (×3): qty 4

## 2021-07-09 MED ORDER — SODIUM BICARBONATE 8.4 % IV SOLN: 150.0000 meq | Freq: Once | INTRAVENOUS | Status: AC

## 2021-07-09 MED ORDER — AMLODIPINE BESYLATE 5 MG PO TABS
2.5000 mg | ORAL_TABLET | Freq: Every day | ORAL | Status: DC
  Administered 2021-07-09: 2.5 mg via ORAL
  Filled 2021-07-09: qty 1

## 2021-07-09 MED ORDER — TRAMADOL HCL 50 MG PO TABS: 50.0000 mg | ORAL_TABLET | Freq: Two times a day (BID) | ORAL | Status: DC | PRN

## 2021-07-09 MED ORDER — SODIUM BICARBONATE 8.4 % IV SOLN
INTRAVENOUS | Status: AC
  Administered 2021-07-09: 150 meq via INTRAVENOUS
  Filled 2021-07-09: qty 150

## 2021-07-09 MED ORDER — ORAL CARE MOUTH RINSE: 15.0000 mL | Freq: Two times a day (BID) | OROMUCOSAL | Status: DC

## 2021-07-09 MED ORDER — SODIUM CHLORIDE 0.9 % IV SOLN
12.5000 mg | Freq: Four times a day (QID) | INTRAVENOUS | Status: DC | PRN
  Administered 2021-07-09 (×2): 12.5 mg via INTRAVENOUS
  Filled 2021-07-09: qty 0.5
  Filled 2021-07-09: qty 12.5

## 2021-07-09 MED ORDER — AMIODARONE IV BOLUS ONLY 150 MG/100ML
150.0000 mg | Freq: Once | INTRAVENOUS | Status: AC
  Administered 2021-07-09: 150 mg via INTRAVENOUS

## 2021-07-09 MED ORDER — METOCLOPRAMIDE HCL 5 MG/ML IJ SOLN: 10.0000 mg | Freq: Four times a day (QID) | INTRAMUSCULAR | Status: DC

## 2021-07-09 MED ORDER — ENOXAPARIN SODIUM 40 MG/0.4ML IJ SOSY
40.0000 mg | PREFILLED_SYRINGE | Freq: Every day | INTRAMUSCULAR | Status: DC
  Administered 2021-07-09: 40 mg via SUBCUTANEOUS
  Filled 2021-07-09: qty 0.4

## 2021-07-09 MED ORDER — METOCLOPRAMIDE HCL 5 MG/ML IJ SOLN
10.0000 mg | Freq: Four times a day (QID) | INTRAMUSCULAR | Status: AC
  Administered 2021-07-09 – 2021-07-10 (×4): 10 mg via INTRAVENOUS
  Filled 2021-07-09 (×4): qty 2

## 2021-07-09 MED ORDER — DEXTROSE 50 % IV SOLN: 0.0000 mL | INTRAVENOUS | Status: DC | PRN

## 2021-07-09 MED ORDER — VASOPRESSIN 20 UNITS/100 ML INFUSION FOR SHOCK
0.0000 [IU]/min | INTRAVENOUS | Status: DC
  Administered 2021-07-09 – 2021-07-11 (×5): 0.04 [IU]/min via INTRAVENOUS
  Filled 2021-07-09 (×6): qty 100

## 2021-07-09 MED ORDER — ALPRAZOLAM 0.5 MG PO TABS: 1.0000 mg | ORAL_TABLET | Freq: Two times a day (BID) | ORAL | Status: DC | PRN

## 2021-07-09 MED ORDER — HEPARIN SODIUM (PORCINE) 5000 UNIT/ML IJ SOLN
5000.0000 [IU] | Freq: Three times a day (TID) | INTRAMUSCULAR | Status: DC
  Administered 2021-07-10 – 2021-07-11 (×5): 5000 [IU] via SUBCUTANEOUS
  Filled 2021-07-09 (×4): qty 1

## 2021-07-09 MED ORDER — MILRINONE LACTATE IN DEXTROSE 20-5 MG/100ML-% IV SOLN
0.2500 ug/kg/min | INTRAVENOUS | Status: DC
  Administered 2021-07-09 (×2): 0.25 ug/kg/min via INTRAVENOUS
  Filled 2021-07-09 (×2): qty 100

## 2021-07-09 MED ORDER — NOREPINEPHRINE 16 MG/250ML-% IV SOLN
0.0000 ug/min | INTRAVENOUS | Status: DC
  Administered 2021-07-09: 10 ug/min via INTRAVENOUS
  Administered 2021-07-10: 22 ug/min via INTRAVENOUS
  Administered 2021-07-11: 12 ug/min via INTRAVENOUS
  Administered 2021-07-12: 8 ug/min via INTRAVENOUS
  Administered 2021-07-13: 10 ug/min via INTRAVENOUS
  Administered 2021-07-14: 8 ug/min via INTRAVENOUS
  Filled 2021-07-09 (×6): qty 250

## 2021-07-09 MED ORDER — LORAZEPAM 2 MG/ML IJ SOLN
2.0000 mg | Freq: Once | INTRAMUSCULAR | Status: AC
  Administered 2021-07-09: 2 mg via INTRAVENOUS
  Filled 2021-07-09: qty 1

## 2021-07-09 MED ORDER — AMLODIPINE BESYLATE 5 MG PO TABS: 2.5000 mg | ORAL_TABLET | Freq: Every day | ORAL | Status: DC

## 2021-07-09 MED ORDER — ORAL CARE MOUTH RINSE
15.0000 mL | Freq: Two times a day (BID) | OROMUCOSAL | Status: DC
  Administered 2021-07-09 (×2): 15 mL via OROMUCOSAL

## 2021-07-09 MED ORDER — CHLORHEXIDINE GLUCONATE 0.12 % MT SOLN
15.0000 mL | Freq: Two times a day (BID) | OROMUCOSAL | Status: DC
  Administered 2021-07-09: 15 mL via OROMUCOSAL

## 2021-07-09 MED ORDER — SODIUM BICARBONATE 8.4 % IV SOLN
INTRAVENOUS | Status: AC
  Administered 2021-07-09: 150 meq via INTRAVENOUS
  Filled 2021-07-09: qty 100

## 2021-07-09 MED ORDER — INSULIN REGULAR(HUMAN) IN NACL 100-0.9 UT/100ML-% IV SOLN
INTRAVENOUS | Status: DC
  Administered 2021-07-09: 10.5 [IU]/h via INTRAVENOUS
  Administered 2021-07-10: 3.4 [IU]/h via INTRAVENOUS
  Filled 2021-07-09 (×2): qty 100

## 2021-07-09 MED FILL — Tirofiban HCl in NaCl 0.9% IV Soln 5 MG/100ML (Base Equiv): INTRAVENOUS | Qty: 100 | Status: AC

## 2021-07-09 NOTE — Progress Notes (Signed)
Inpatient Diabetes Program Recommendations  AACE/ADA: New Consensus Statement on Inpatient Glycemic Control (2015)  Target Ranges:  Prepandial:   less than 140 mg/dL      Peak postprandial:   less than 180 mg/dL (1-2 hours)      Critically ill patients:  140 - 180 mg/dL   Lab Results  Component Value Date   GLUCAP 112 (H) 07/09/2021   HGBA1C 6.7 (H) 07/07/2021    Review of Glycemic Control Results for Billy [name], Billy [name] (MRN [id number]) as of 07/09/2021 09:39  Ref. Range 07/09/2021 04:04 07/09/2021 04:56 07/09/2021 06:29 07/09/2021 07:53  Glucose-Capillary Latest Ref Range: 70 - 99 mg/dL 147 (H) 155 (H) 160 (H) 112 (H)   Diabetes history: Type 2 DM Outpatient Diabetes medications: tradjenta 5 mg QD, Tresiba 10-30 units PRN Current orders for Inpatient glycemic control: Novolog 0-24 units Q4H, IV insulin  Inpatient Diabetes Program Recommendations:    Noted plan for transition to Q4H correction, would also consider adding Levemir 10 units two hours prior to discontinuation of IV insulin, then QD to follow.   Thanks, Bronson Curb, MSN, RNC-OB Diabetes Coordinator [phone] (8a-5p)

## 2021-07-09 NOTE — Plan of Care (Signed)

## 2021-07-09 NOTE — Progress Notes (Signed)
Progress Note  Patient Name: Billy [name] Date of Encounter: 07/09/2021  [facility] Cardiologist: None   Subjective   Not feeling well. Complains of nausea and expected chest soreness.  Inpatient Medications    Scheduled Meds:  acetaminophen  1,000 mg Oral Q6H   Or   acetaminophen (TYLENOL) oral liquid 160 mg/5 mL  1,000 mg Per Tube Q6H   amLODipine  2.5 mg Oral Daily   aspirin EC  325 mg Oral Daily   Or   aspirin  324 mg Per Tube Daily   atorvastatin  80 mg Oral Daily   bisacodyl  10 mg Oral Daily   Or   bisacodyl  10 mg Rectal Daily   Chlorhexidine Gluconate Cloth  6 each Topical Daily   docusate sodium  200 mg Oral Daily   enoxaparin (LOVENOX) injection  40 mg Subcutaneous QHS   insulin aspart  0-24 Units Subcutaneous Q4H   mouth rinse  15 mL Mouth Rinse BID   metoprolol tartrate  12.5 mg Oral BID   Or   metoprolol tartrate  12.5 mg Per Tube BID   [START ON 07/10/2021] pantoprazole  40 mg Oral Daily   sodium chloride flush  10-40 mL Intracatheter Q12H   sodium chloride flush  3 mL Intravenous Q12H   Continuous Infusions:  sodium chloride     sodium chloride     sodium chloride     albumin human 12.5 g (07/08/21 1827)   And   sodium chloride Stopped (07/08/21 2051)   amiodarone 30 mg/hr (07/09/21 0600)    ceFAZolin (ANCEF) IV Stopped (07/09/21 0557)   dexmedetomidine (PRECEDEX) IV infusion Stopped (07/09/21 0505)   DOBUTamine     insulin 3.2 Units/hr (07/09/21 0600)   lactated ringers     lactated ringers 20 mL/hr at 07/09/21 0600   lactated ringers     niCARDipine 2.5 mg/hr (07/09/21 0600)   nitroGLYCERIN Stopped (07/08/21 1801)   norepinephrine (LEVOPHED) Adult infusion 3 mcg/min (07/09/21 0622)   phenylephrine ([name]-SYNEPHRINE) Adult infusion     promethazine (PHENERGAN) injection (IM or IVPB)     vasopressin     PRN Meds: sodium chloride, albumin human **AND** sodium chloride, dextrose, lactated ringers, metoprolol tartrate, midazolam,  morphine injection, ondansetron (ZOFRAN) IV, oxyCODONE, promethazine (PHENERGAN) injection (IM or IVPB), sodium chloride flush, sodium chloride flush, traMADol   Vital Signs    Vitals:   07/09/21 0545 07/09/21 0600 07/09/21 0615 07/09/21 0630  BP:  117/77    Pulse: 66 68 66 69  Resp: (!) 24 (!) 25 (!) 25 (!) 21  Temp: 100 F (37.8 C) 99.9 F (37.7 C) 99.9 F (37.7 C) 99.9 F (37.7 C)  TempSrc:      SpO2: 96% 95% 96% 91%  Weight:      Height:        Intake/Output Summary (Last 24 hours) at 07/09/2021 0747 Last data filed at 07/09/2021 0600 Gross per 24 hour  Intake 6223.28 ml  Output 2195 ml  Net 4028.28 ml   Last 3 Weights 07/09/2021 07/08/2021 07/07/2021  Weight (lbs) 266 lb 5.1 oz 255 lb 1.2 oz 255 lb  Weight (kg) 120.8 kg 115.7 kg 115.667 kg      Telemetry    Sinus rhythm since surgery, no recurrent AF. Brief period of demand pacing last night.  - Personally Reviewed  ECG    NSR with ST-T changes consider inferolateral ischemia - Personally Reviewed  Physical Exam  Alert, oriented, NAD GEN: No acute  distress.   Neck: No JVD Cardiac: RRR, no murmurs, rubs, or gallops.  Respiratory: diminished in the bases bilaterally. GI: Soft, nontender, non-distended  MS: trace BL pretibial edema; No deformity. Neuro:  Nonfocal  Psych: Normal affect   Labs    High Sensitivity Troponin:   Recent Labs  Lab 06/24/21 2310 06/25/21 0300 07/07/21 1353 07/07/21 1539  TROPONINIHS 38* 44* 22,997* 20,631*     Chemistry Recent Labs  Lab 07/07/21 1353 07/08/21 0753 07/08/21 1154 07/08/21 1346 07/08/21 1354 07/08/21 1454 07/08/21 1523 07/09/21 0214 07/09/21 0334 07/09/21 0336  NA 137 136   < > 141   < > 139   < > 140 138 140  K 4.4 4.5   < > 4.4   < > 4.9   < > 4.6 5.0 4.8  CL 105 106   < > 104  --  106  --   --  111  --   CO2 20* 23  --   --   --   --   --   --  21*  --   GLUCOSE 229* 172*   < > 116*  --  120*  --   --  151*  --   BUN 20 20   < > 19  --  20  --    --  24*  --   CREATININE 1.92* 1.99*   < > 2.10*  --  2.10*  --   --  3.07*  --   CALCIUM 8.7* 8.2*  --   --   --   --   --   --  7.7*  --   MG  --  1.8  --   --   --   --   --   --  2.7*  --   PROT 6.1*  --   --   --   --   --   --   --   --   --   ALBUMIN 3.3*  --   --   --   --   --   --   --   --   --   AST 113*  --   --   --   --   --   --   --   --   --   ALT 24  --   --   --   --   --   --   --   --   --   ALKPHOS 38  --   --   --   --   --   --   --   --   --   BILITOT 1.0  --   --   --   --   --   --   --   --   --   GFRNONAA 40* 39*  --   --   --   --   --   --  23*  --   ANIONGAP 12 7  --   --   --   --   --   --  6  --    < > = values in this interval not displayed.    Lipids  Recent Labs  Lab 07/07/21 1353  CHOL 208*  TRIG 212*  HDL 35*  LDLCALC 131*  CHOLHDL 5.9    Hematology Recent Labs  Lab 07/08/21 0753 07/08/21 1154 07/08/21 1520 07/08/21 1523 07/08/21 1743 07/08/21  1746 07/09/21 0214 07/09/21 0334 07/09/21 0336  WBC 7.7  --   --   --  11.8*  --   --  12.4*  --   RBC 4.25  --   --   --  3.34*  --   --  3.35*  --   HGB 13.6   < > 9.1*   < > 10.5*   < > 9.5* 10.5* 9.2*  HCT 38.8*   < > 26.8*   < > 31.6*   < > 28.0* 31.0* 27.0*  MCV 91.3  --   --   --  94.6  --   --  92.5  --   MCH 32.0  --   --   --  31.4  --   --  31.3  --   MCHC 35.1  --   --   --  33.2  --   --  33.9  --   RDW 12.3  --   --   --  12.4  --   --  12.5  --   PLT 192  --  190  --  147*  --   --  183  --    < > = values in this interval not displayed.   Thyroid No results for input(s): TSH, FREET4 in the last 168 hours.  BNPNo results for input(s): BNP, PROBNP in the last 168 hours.  DDimer No results for input(s): DDIMER in the last 168 hours.   Radiology    CARDIAC CATHETERIZATION  Result Date: 07/07/2021   1st Diag-2 lesion is 100% stenosed.   1st Diag-1 lesion is 90% stenosed.   Prox LAD lesion is 65% stenosed.   Mid LAD lesion is 50% stenosed.   Dist RCA lesion is 80%  stenosed.   Dist Cx lesion is 80% stenosed.   A drug-eluting stent was successfully placed using a STENT ONYX FRONTIER 2.5X15.   Post intervention, there is a 0% residual stenosis.   LV end diastolic pressure is normal.  Acute occluded first diagonal treated with one drug-eluting stent; the patient will be maintained on Aggrastat until a revascularization strategy is devised for residual multivessel disease with long segment LAD involvement.  LVEDP 35mmHg. The results were discussed with Dr. Burt Knack.   DG Chest Port 1 View  Result Date: 07/08/2021 CLINICAL DATA:  Post CABG, Maze procedure EXAM: PORTABLE CHEST 1 VIEW COMPARISON:  07/07/2021 FINDINGS: Changes of CABG. Endotracheal tube is 3 cm above the carina. NG tube enters the stomach. Right central line tip in the SVC. Mild cardiomegaly with vascular congestion. Layering bilateral effusions with bibasilar atelectasis, left greater than right. No pneumothorax. IMPRESSION: Changes of CABG.  Support devices as above. Layering bilateral effusions with bibasilar atelectasis. Electronically Signed   By: Rolm Baptise M.D.   On: 07/08/2021 17:57   DG Chest Port 1 View  Result Date: 07/07/2021 CLINICAL DATA:  STEMI, chest pressure, history diabetes mellitus, hypertension EXAM: PORTABLE CHEST 1 VIEW COMPARISON:  Portable exam 1434 hours compared to 06/24/2021 FINDINGS: Upper normal heart size. Mediastinal contours and pulmonary vascularity normal. Lungs clear. No acute infiltrate, pleural effusion, or pneumothorax. Osseous structures unremarkable IMPRESSION: No acute abnormalities. Electronically Signed   By: Lavonia Dana M.D.   On: 07/07/2021 15:11   MYOCARDIAL PERFUSION IMAGING  Result Date: 07/07/2021   Findings are consistent with prior myocardial infarction. The study is high risk.   ST elevation in the lateral and anterior leads was noted.   LV  perfusion is abnormal. Defect 1: There is a large defect with severe reduction in uptake present in the apical  to mid anterior location(s) that is fixed. Consistent with infarction.   Prior study not available for comparison. Read as a rest only study. Immediately following stress testing due to ECG changes and symptoms, patient was evaluated by DOD and a Code STEMI was called.  Patient was transported to hospital via EMS for emergent cath. On rest images, there is lack of uptake in the mid to apical anterior wall. This is suggestive of infarction. ECG also shows anterolateral ST elevation after lexiscan, with progression to afib RVR. Results reported to Dr. Burt Knack, who has seen patient in ER.   ECHO INTRAOPERATIVE TEE  Result Date: 07/08/2021  *INTRAOPERATIVE TRANSESOPHAGEAL REPORT *  Patient Name:   AMELIO BROSKY Date of Exam: 07/08/2021 Medical Rec #:  [id number]      Height:       74.0 in Accession #:    [id number]     Weight:       255.1 lb Date of Birth:  1965-12-05      BSA:          2.41 m Patient Age:    55 years       BP:           143/86 mmHg Patient Gender: M              HR:           71 bpm. Exam Location:  Inpatient Transesophogeal exam was perform intraoperatively during surgical procedure. Patient was closely monitored under general anesthesia during the entirety of examination. Indications:     I25.110 Atherosclerotic heart disease of native coronary artery                  with unstable angina pectoris Performing Phys: [id number] Lucile Crater LIGHTFOOT Diagnosing Phys: Annye Asa MD Complications: No known complications during this procedure. POST-OP IMPRESSIONS limited post-CPB exam: The patient separated from CPB with Dobutamine assistance. _ Left Ventricle: The left ventricular function is essentially unchanged unchanged from pre-bypass images. The ventricle has severely reduced systolic function, with global hypokinesis, and severe hypokinesis of the inferior wall. The overall ejection fraction appears 20%. There is slight improvement with time off of CPB, allowing discontinuation of the  Dobutamine. _ Right Ventricle: The right ventricular function appears unchanged from pre-bypass images. The RV was slightly hypokinetic on initial separation from CPB, but this quickly normalized. _ Aortic Valve: The aortic valve function appears unchanged from pre-bypass images. _ Mitral Valve: The mitral valve function appears unchanged from pre-bypass images. _ Tricuspid Valve: The tricuspid valve function appears unchanged from pre-bypass. There is trivial TR. PRE-OP FINDINGS  Left Ventricle: The left ventricle has moderate-severely reduced systolic function, with an ejection fraction of 30-35%, measured 33%. The cavity size appears mildly dilated. Left ventricular diffuse hypokinesis, with Severe hypokinesis of the left ventricular, inferior wall and inferoseptal wall. There is no left ventricular hypertrophy. Left ventricular diastolic function was not evaluated. Right Ventricle: The right ventricle has normal systolic function. The cavity size is normal. There is no increase in right ventricular wall thickness. Left Atrium: Mild Left atrial size was dilated. No left atrial/left atrial appendage thrombus was detected. Left atrial appendage velocity is normal at greater than 40 cm/s. Right Atrium: Right atrial size was normal in size. Catheter present in the right atrium. Interatrial Septum: No atrial level shunt detected by color flow Doppler. There  is no evidence of a patent foramen ovale. Pericardium: A small pericardial effusion is present. The pericardial effusion is circumferential. Mitral Valve: The mitral valve is normal in structure. Mitral valve regurgitation is not visualized by color flow Doppler. There is no evidence of mitral valve vegetation. There is no evidence of mitral stenosis, with peak gradient 3 mmHg, mean gradient 1 mmHg. Tricuspid Valve: The tricuspid valve was normal in structure. Tricuspid valve regurgitation is trivial by color flow Doppler. No evidence of tricuspid stenosis is  present. There is no evidence of tricuspid valve vegetation. Aortic Valve: The aortic valve is tricuspid. Aortic valve regurgitation was not visualized by color flow Doppler. There is no stenosis of the aortic valve, with peak gradient 3 mmHg, mean gradient 2 mmHg. There is no evidence of aortic valve vegetation. Pulmonic Valve: The pulmonic valve was normal in structure, with normal leaflet mobility. No evidence of pulmonic stenosis. Pulmonic valve regurgitation is trivial by color flow Doppler. Aorta: The aortic root, ascending aorta and aortic arch appear normal in size and structure, although The aortic arch was not well visualized. Pulmonary Artery: The pulmonary artery is of normal size. Venous: The inferior vena cava is dilated in size with greater than 50% respiratory variability, suggesting right atrial pressure of 8 mmHg. Shunts: There is no evidence of an atrial septal defect. +-------------+--------++ AORTIC VALVE          +-------------+--------++ AV Mean Grad:2.0 mmHg +-------------+--------++  Annye Asa MD Electronically signed by Annye Asa MD Signature Date/Time: 07/08/2021/6:44:10 PM    Final     Cardiac Studies   See studies above  Patient Profile     55 y.o. male with multivessel CAD, diagonal occlusion, presents with anterolateral STEMI treated with primary PCI goes for multivessel CABG 07-10-2021  Assessment & Plan    Anterolateral STEMI: severe multivessel CAD, treated with PCI of culprit vessel followed by urgent CABG for complete revascularization. POD #1, expected course.  Acute on chronic kidney injury (stage 3 baseline): likely combination of contrast, post-op. Follow.  Post-op blood loss anemia, mild, expected: stable with HgB 9.2 this am  CV stable, rhythm normal sinus. Pt on low-dose levophed, nicardipine, amio, insulin gtts per TCTS service. Will follow with you.     For questions or updates, please contact [facility] Please consult  [email or web address] for contact info under        Signed, Sherren Mocha, MD  07/09/2021, 7:47 AM

## 2021-07-09 NOTE — Progress Notes (Signed)
Pt placed on Bipap per MD due to low oxygen levels and struggling with patients work of breathing.  RT will continue to monitor.

## 2021-07-09 NOTE — Hospital Course (Addendum)
History of the present illness:  The patient is a 55 year old gentleman with a history of paroxysmal atrial fibrillation.  Additionally he is noted to have poorly treated hypertension.  The patient was recently referred to cardiology for a stress test due to exertional dyspnea.  During the study he developed chest discomfort and was converted from a exercise to [facility] stress test.  He then was noted to have ST elevation on monitoring and subsequent to that EMS was called.  The patient then went into atrial fibrillation with worsening of his lateral injury current.  The patient refused coronary stenting and additional cardiac medications and even stated he was "ready to die".  He was sent to the ER with some improvement but continued to be diaphoretic and had 4/10 ongoing chest discomfort.  The symptoms were ongoing since the time of the stress test.  Full discussion of the patient was undertaken regarding PCI and catheterization being standard of care for acute myocardial infarction.  Patient declined stenting but said he would be willing to have heart catheterization and even angioplasty.  A left heart catheterization and a PCI to the diagonal were done but he was noted to have severe three-vessel disease and cardiothoracic surgical consultation was obtained with Dr. [name].  Dr. [name] recommended proceeding with CABG as his best revascularization option due to the severity of the anatomical findings and the STEMI event.  Hospital course:  The patient was medically stabilized and proceeded to the operating room on 07/09/19 22 at which time he underwent CABG x4.  He additionally underwent a maze procedure on the left side with box lesion set.  Also, and a LAA atrial clip was placed.  He tolerated the procedure well was taken to the surgical intensive care unit in stable condition.  Postoperative hospital course The patient has had a complex postoperative course.  His intraoperative ejection  fraction was noted to be 20%.  The advanced heart failure team was brought in to assist with management.  Initially he was intubated and hypotensive necessitating placement on norepinephrine and vasopressin.  He also went into a recurrent atrial fibrillation was started on intravenous amiodarone.  On 1129 his car oximetry were noted to be low and dobutamine was added.  On 07/15/2021 he was able to be extubated and went into recurrent atrial fibrillation with his amiodarone drip being reinitiated.  On 11 3 his dobutamine was discontinued.  He has had a postoperative acute on chronic renal failure and nephrology was brought in to assist with management.  On 07/20/2021 he was started on intermittent hemodialysis and a right internal jugular tunneled HD catheter was placed on 07/22/2021.  He has a expected acute blood loss anemia with chronic renal insufficiency he has been managed by the nephrology service with iron as well as Aranesp.  He has also required postoperative packed cell transfusion.  His hematocrit is stabilized at approximately 24% over several days.  He has felt to require both Plavix and anticoagulation therapy with the Plavix planned for 6 months post PCI.  Aspirin has been discontinued.  His amiodarone is being converted to p.o. when he has maintained sinus rhythm for several days.  As noted he is also on Eliquis.  His renal function has shown a steady improvement and hopes are that he will not require further dialysis.  This is being monitored closely by the nephrology service.  He is felt to be very stable from a heart failure perspective on hydralazine, Imdur and Coreg.  He is  not felt to be a candidate for ARN/SGLT2/spironolactone due to his renal insufficiency and GDMT will require further initiation as his condition stabilizes.  His most recent echocardiogram done on 07/29/2021 showed his ejection fraction to be 40 to 45%.  In terms of his pulmonary status following extubation he was treated for a  suspected pneumonia with cefepime.  His course has been completed.  He is also noted to have had a thrombocytopenia which is resolved.  He did have an episode of right-sided weakness during the early hospitalization head CT was normal on 11/3.  He did have some urinary retention requiring Foley placement and this has subsequently been discontinued over time.  He is on Flomax as well.  In terms of disposition is felt that he will require short-term skilled nursing facility as he does not qualify for CIR and lives alone.  This was not approved by his insurance company and therefore he has been monitoring for clinical stability for discharge with home health nursing and physical therapy arrangements.  The patient's urinary output continued to improved.  His creatinine and BUN improved.  It was felt he would no longer require dialysis and his tunneled HD catheter was removed on 07/31/2021. He had dizziness and was found to be orthostatic.  His blood glucose levels remain elevated and his Levemir dosing was increased.  Plan is to resume him on his home diabetic medication regimen at discharge and he will need close follow-up with his primary care.  His orthostasis persisted and his Imdur dose was decreased.  This is continued overall to show a steady progress and symptoms of dizziness have significantly improved.  His Amiodarone was also discontinued as he was maintaining NSR and had completed a several week course of medication.  His incisions are noted to be healing well without evidence of infection.  He is tolerating routine activity using standard protocols.  His laboratory values are all felt to be stable and of note his creatinine on 08/06/2021 is 2.2.  This is near preoperative baseline.  Overall his status is felt to be quite stable for discharge on 08/06/2001 with home health arrangements in place.

## 2021-07-09 NOTE — Procedures (Signed)
Extubation Procedure Note  Patient Details:   Name: Billy [name] DOB: Jan 05, 1966 MRN: [id number]   Airway Documentation:  Airway 8 mm (Active)  Secured at (cm) 27 cm 07/08/21 2300  Measured From Lips 07/08/21 2300  Secured Location Right 07/08/21 2300  Secured By Pink Tape 07/08/21 2300  Prone position No 07/08/21 2300  Cuff Pressure (cm H2O) Clear OR 27-39 CmH2O 07/08/21 2300  Site Condition Dry 07/08/21 2300   Vent end date: (not recorded) Vent end time: (not recorded)   Evaluation  O2 sats: stable throughout Complications: No apparent complications Patient did tolerate procedure well. Bilateral Breath Sounds: Clear, Diminished   Yes Patient extubated to 4L [place] bubbler at this time, sat 91-94%. No stridor noted at this time, clear bilateral BS. Patient able to say name. Nif repeated -25. VC 1.86ml. Hold off on IS at this time due to patient feeling N/V. RT will continue to monitor.   Binnie Kand 07/09/2021, 2:26 AM
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
## 2021-07-09 NOTE — Progress Notes (Signed)
[address]       [place] [address]             [phone]                 1 Day Post-Op Procedure(s) (LRB): CORONARY ARTERY BYPASS GRAFTING (CABG) TIMES FOUR USING LEFT INTERNAL MAMMARY ARTERY, GREATER SAPHENOUS VEIN HARVESTED ENDOSCOPICALLY, AND LEFT RADIAL ARTERY HARVESTED OPEN (N/A) TRANSESOPHAGEAL ECHOCARDIOGRAM (TEE) (N/A) RADIAL ARTERY HARVEST (Left) ENDOVEIN HARVEST OF GREATER SAPHENOUS VEIN (Right) MAZE   Events: No events Extubated overnight _______________________________________________________________ Vitals: BP 117/77   Pulse 69   Temp 99.9 F (37.7 C)   Resp (!) 21   Ht 6\' 2"  (1.88 m)   Wt 120.8 kg   SpO2 91%   BMI 34.19 kg/m  Filed Weights   07/07/21 1329 07/08/21 1049 07/09/21 0407  Weight: 115.7 kg 115.7 kg 120.8 kg     - Neuro: alert NAD  - Cardiovascular: sinus  Drips: amio, cardene 2.5, levo 3.   CVP:  [0 mmHg-25 mmHg] 25 mmHg  - Pulm: EWOB   ABG    Component Value Date/Time   PHART 7.315 (L) 07/09/2021 0336   PCO2ART 39.8 07/09/2021 0336   PO2ART 80 (L) 07/09/2021 0336   HCO3 20.1 07/09/2021 0336   TCO2 21 (L) 07/09/2021 0336   ACIDBASEDEF 5.0 (H) 07/09/2021 0336   O2SAT 94.0 07/09/2021 0336    - Abd: ND - Extremity: warm  .Intake/Output      10/25 0701 10/26 0700 10/26 0701 10/27 0700   I.V. (mL/kg) 3931 (32.5)    Blood 400    IV Piggyback 1892.3    Total Intake(mL/kg) 6223.3 (51.5)    Urine (mL/kg/hr) 915 (0.3)    Emesis/NG output 50    Blood 800    Chest Tube 430    Total Output 2195    Net +4028.3            _______________________________________________________________ Labs: CBC Latest Ref Rng & Units 07/09/2021 07/09/2021 07/09/2021  WBC 4.0 - 10.5 K/uL - 12.4(H) -  Hemoglobin 13.0 - 17.0 g/dL 9.2(L) 10.5(L) 9.5(L)  Hematocrit 39.0 - 52.0 % 27.0(L) 31.0(L) 28.0(L)  Platelets 150 - 400 K/uL - 183 -   CMP Latest Ref Rng & Units 07/09/2021 07/09/2021 07/09/2021  Glucose 70 - 99  mg/dL - 151(H) -  BUN 6 - 20 mg/dL - 24(H) -  Creatinine 0.61 - 1.24 mg/dL - 3.07(H) -  Sodium 135 - 145 mmol/L 140 138 140  Potassium 3.5 - 5.1 mmol/L 4.8 5.0 4.6  Chloride 98 - 111 mmol/L - 111 -  CO2 22 - 32 mmol/L - 21(L) -  Calcium 8.9 - 10.3 mg/dL - 7.7(L) -  Total Protein 6.5 - 8.1 g/dL - - -  Total Bilirubin 0.3 - 1.2 mg/dL - - -  Alkaline Phos 38 - 126 U/L - - -  AST 15 - 41 U/L - - -  ALT 0 - 44 U/L - - -    CXR: PV congestion  _______________________________________________________________  Assessment and Plan: POD 1 s/p CABG, MAZE, atriclip  Neuro: pain controlled CV: will keep on amio gtt, and transition to PO tomorrow.  Will remove wires tomorrow.  Will start plavix tomorrow, on A/S/BB Pulm: continue CTs Renal: creat up.  Will continue to follow. GI: nausea.  Adding phenergan Heme: stable ID: [email or web address] Endo: SSI, and will add long acting Dispo: continue ICU care   Lajuana Matte 07/09/2021  7:50 AM
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
## 2021-07-09 NOTE — Progress Notes (Signed)
Patient ID: Billy [name], male   DOB: Feb 07, 1966, 55 y.o.   MRN: [id number] T[facility] Evening Rounds:  Has had a rough day.  Seen by Heart Failure team and started on milrinone 0.25. NE on 4.  CVP 11. CI has been good by Flow Track.  Sinus 80's  Oligo-anuria with creat up to 4.4 this pm.   BMET    Component Value Date/Time   NA 135 07/09/2021 1625   K 5.3 (H) 07/09/2021 1625   CL 106 07/09/2021 1625   CO2 18 (L) 07/09/2021 1625   GLUCOSE 300 (H) 07/09/2021 1625   BUN 32 (H) 07/09/2021 1625   BUN 31 02/26/2016 0000   CREATININE 4.41 (H) 07/09/2021 1625   CREATININE 1.02 09/09/2015 1710   CALCIUM 7.5 (L) 07/09/2021 1625   GFRNONAA 15 (L) 07/09/2021 1625   CBC    Component Value Date/Time   WBC 15.6 (H) 07/09/2021 1625   RBC 2.86 (L) 07/09/2021 1625   HGB 9.1 (L) 07/09/2021 1625   HCT 27.4 (L) 07/09/2021 1625   PLT 181 07/09/2021 1625   MCV 95.8 07/09/2021 1625   MCH 31.8 07/09/2021 1625   MCHC 33.2 07/09/2021 1625   RDW 12.8 07/09/2021 1625   LYMPHSABS 2.0 07/07/2021 1353   MONOABS 0.8 07/07/2021 1353   EOSABS 0.1 07/07/2021 1353   BASOSABS 0.0 07/07/2021 1353   Nausea and retching this pm. Will add Reglan.  Sitting up in chair.

## 2021-07-10 ENCOUNTER — Inpatient Hospital Stay (HOSPITAL_COMMUNITY): Payer: Medicare HMO

## 2021-07-10 DIAGNOSIS — R570 Cardiogenic shock: Secondary | ICD-10-CM | POA: Diagnosis not present

## 2021-07-10 DIAGNOSIS — I3139 Other pericardial effusion (noninflammatory): Secondary | ICD-10-CM

## 2021-07-10 DIAGNOSIS — N179 Acute kidney failure, unspecified: Secondary | ICD-10-CM | POA: Diagnosis not present

## 2021-07-10 DIAGNOSIS — J9601 Acute respiratory failure with hypoxia: Secondary | ICD-10-CM | POA: Diagnosis not present

## 2021-07-10 LAB — RENAL FUNCTION PANEL
Albumin: 2.3 g/dL — ABNORMAL LOW (ref 3.5–5.0)
Anion gap: 12 (ref 5–15)
BUN: 37 mg/dL — ABNORMAL HIGH (ref 6–20)
CO2: 25 mmol/L (ref 22–32)
Calcium: 7.7 mg/dL — ABNORMAL LOW (ref 8.9–10.3)
Chloride: 100 mmol/L (ref 98–111)
Creatinine, Ser: 5.52 mg/dL — ABNORMAL HIGH (ref 0.61–1.24)
GFR, Estimated: 11 mL/min — ABNORMAL LOW (ref >60)
Glucose, Bld: 152 mg/dL — ABNORMAL HIGH (ref 70–99)
Phosphorus: 5 mg/dL — ABNORMAL HIGH (ref 2.5–4.6)
Potassium: 4.2 mmol/L (ref 3.5–5.1)
Sodium: 137 mmol/L (ref 135–145)

## 2021-07-10 LAB — GLUCOSE, CAPILLARY
Glucose-Capillary: 112 mg/dL — ABNORMAL HIGH (ref 70–99)
Glucose-Capillary: 115 mg/dL — ABNORMAL HIGH (ref 70–99)
Glucose-Capillary: 140 mg/dL — ABNORMAL HIGH (ref 70–99)
Glucose-Capillary: 142 mg/dL — ABNORMAL HIGH (ref 70–99)
Glucose-Capillary: 144 mg/dL — ABNORMAL HIGH (ref 70–99)
Glucose-Capillary: 144 mg/dL — ABNORMAL HIGH (ref 70–99)
Glucose-Capillary: 144 mg/dL — ABNORMAL HIGH (ref 70–99)
Glucose-Capillary: 147 mg/dL — ABNORMAL HIGH (ref 70–99)
Glucose-Capillary: 149 mg/dL — ABNORMAL HIGH (ref 70–99)
Glucose-Capillary: 151 mg/dL — ABNORMAL HIGH (ref 70–99)
Glucose-Capillary: 151 mg/dL — ABNORMAL HIGH (ref 70–99)
Glucose-Capillary: 153 mg/dL — ABNORMAL HIGH (ref 70–99)
Glucose-Capillary: 155 mg/dL — ABNORMAL HIGH (ref 70–99)
Glucose-Capillary: 159 mg/dL — ABNORMAL HIGH (ref 70–99)
Glucose-Capillary: 162 mg/dL — ABNORMAL HIGH (ref 70–99)
Glucose-Capillary: 162 mg/dL — ABNORMAL HIGH (ref 70–99)
Glucose-Capillary: 174 mg/dL — ABNORMAL HIGH (ref 70–99)
Glucose-Capillary: 175 mg/dL — ABNORMAL HIGH (ref 70–99)
Glucose-Capillary: 188 mg/dL — ABNORMAL HIGH (ref 70–99)
Glucose-Capillary: 212 mg/dL — ABNORMAL HIGH (ref 70–99)
Glucose-Capillary: 82 mg/dL (ref 70–99)
Glucose-Capillary: 84 mg/dL (ref 70–99)
Glucose-Capillary: 99 mg/dL (ref 70–99)

## 2021-07-10 LAB — MAGNESIUM
Magnesium: 2.3 mg/dL (ref 1.7–2.4)
Magnesium: 2.4 mg/dL (ref 1.7–2.4)
Magnesium: 2.5 mg/dL — ABNORMAL HIGH (ref 1.7–2.4)

## 2021-07-10 LAB — POCT I-STAT 7, (LYTES, BLD GAS, ICA,H+H)
Acid-Base Excess: 1 mmol/L (ref 0.0–2.0)
Acid-base deficit: 10 mmol/L — ABNORMAL HIGH (ref 0.0–2.0)
Acid-base deficit: 11 mmol/L — ABNORMAL HIGH (ref 0.0–2.0)
Acid-base deficit: 3 mmol/L — ABNORMAL HIGH (ref 0.0–2.0)
Bicarbonate: 15.6 mmol/L — ABNORMAL LOW (ref 20.0–28.0)
Bicarbonate: 16.6 mmol/L — ABNORMAL LOW (ref 20.0–28.0)
Bicarbonate: 24.1 mmol/L (ref 20.0–28.0)
Bicarbonate: 27.2 mmol/L (ref 20.0–28.0)
Calcium, Ion: 0.95 mmol/L — ABNORMAL LOW (ref 1.15–1.40)
Calcium, Ion: 1.09 mmol/L — ABNORMAL LOW (ref 1.15–1.40)
Calcium, Ion: 1.1 mmol/L — ABNORMAL LOW (ref 1.15–1.40)
Calcium, Ion: 1.17 mmol/L (ref 1.15–1.40)
HCT: 21 % — ABNORMAL LOW (ref 39.0–52.0)
HCT: 25 % — ABNORMAL LOW (ref 39.0–52.0)
HCT: 25 % — ABNORMAL LOW (ref 39.0–52.0)
HCT: 26 % — ABNORMAL LOW (ref 39.0–52.0)
Hemoglobin: 7.1 g/dL — ABNORMAL LOW (ref 13.0–17.0)
Hemoglobin: 8.5 g/dL — ABNORMAL LOW (ref 13.0–17.0)
Hemoglobin: 8.5 g/dL — ABNORMAL LOW (ref 13.0–17.0)
Hemoglobin: 8.8 g/dL — ABNORMAL LOW (ref 13.0–17.0)
O2 Saturation: 87 %
O2 Saturation: 90 %
O2 Saturation: 91 %
O2 Saturation: 97 %
Patient temperature: 36.2
Patient temperature: 36.3
Patient temperature: 36.6
Patient temperature: 37
Potassium: 3.6 mmol/L (ref 3.5–5.1)
Potassium: 4.1 mmol/L (ref 3.5–5.1)
Potassium: 4.2 mmol/L (ref 3.5–5.1)
Potassium: 5.2 mmol/L — ABNORMAL HIGH (ref 3.5–5.1)
Sodium: 135 mmol/L (ref 135–145)
Sodium: 137 mmol/L (ref 135–145)
Sodium: 139 mmol/L (ref 135–145)
Sodium: 142 mmol/L (ref 135–145)
TCO2: 17 mmol/L — ABNORMAL LOW (ref 22–32)
TCO2: 18 mmol/L — ABNORMAL LOW (ref 22–32)
TCO2: 26 mmol/L (ref 22–32)
TCO2: 29 mmol/L (ref 22–32)
pCO2 arterial: 34.4 mmHg (ref 32.0–48.0)
pCO2 arterial: 39 mmHg (ref 32.0–48.0)
pCO2 arterial: 48 mmHg (ref 32.0–48.0)
pCO2 arterial: 52.4 mmHg — ABNORMAL HIGH (ref 32.0–48.0)
pH, Arterial: 7.237 — ABNORMAL LOW (ref 7.350–7.450)
pH, Arterial: 7.263 — ABNORMAL LOW (ref 7.350–7.450)
pH, Arterial: 7.267 — ABNORMAL LOW (ref 7.350–7.450)
pH, Arterial: 7.359 (ref 7.350–7.450)
pO2, Arterial: 62 mmHg — ABNORMAL LOW (ref 83.0–108.0)
pO2, Arterial: 65 mmHg — ABNORMAL LOW (ref 83.0–108.0)
pO2, Arterial: 68 mmHg — ABNORMAL LOW (ref 83.0–108.0)
pO2, Arterial: 89 mmHg (ref 83.0–108.0)

## 2021-07-10 LAB — CBC
HCT: 25.8 % — ABNORMAL LOW (ref 39.0–52.0)
Hemoglobin: 8.9 g/dL — ABNORMAL LOW (ref 13.0–17.0)
MCH: 31.9 pg (ref 26.0–34.0)
MCHC: 34.5 g/dL (ref 30.0–36.0)
MCV: 92.5 fL (ref 80.0–100.0)
Platelets: 213 10*3/uL (ref 150–400)
RBC: 2.79 MIL/uL — ABNORMAL LOW (ref 4.22–5.81)
RDW: 12.6 % (ref 11.5–15.5)
WBC: 16.3 10*3/uL — ABNORMAL HIGH (ref 4.0–10.5)
nRBC: 0 % (ref 0.0–0.2)

## 2021-07-10 LAB — HEPATIC FUNCTION PANEL
ALT: 5 U/L (ref 0–44)
AST: 29 U/L (ref 15–41)
Albumin: 2.3 g/dL — ABNORMAL LOW (ref 3.5–5.0)
Alkaline Phosphatase: 40 U/L (ref 38–126)
Bilirubin, Direct: <0.1 mg/dL (ref 0.0–0.2)
Total Bilirubin: 0.6 mg/dL (ref 0.3–1.2)
Total Protein: 5.2 g/dL — ABNORMAL LOW (ref 6.5–8.1)

## 2021-07-10 LAB — ECHOCARDIOGRAM LIMITED
Height: 74 in
S' Lateral: 3.8 cm
Weight: 4211.67 oz

## 2021-07-10 LAB — BASIC METABOLIC PANEL
Anion gap: 12 (ref 5–15)
BUN: 37 mg/dL — ABNORMAL HIGH (ref 6–20)
CO2: 24 mmol/L (ref 22–32)
Calcium: 7.3 mg/dL — ABNORMAL LOW (ref 8.9–10.3)
Chloride: 101 mmol/L (ref 98–111)
Creatinine, Ser: 5.07 mg/dL — ABNORMAL HIGH (ref 0.61–1.24)
GFR, Estimated: 13 mL/min — ABNORMAL LOW (ref >60)
Glucose, Bld: 205 mg/dL — ABNORMAL HIGH (ref 70–99)
Potassium: 4 mmol/L (ref 3.5–5.1)
Sodium: 137 mmol/L (ref 135–145)

## 2021-07-10 LAB — PHOSPHORUS: Phosphorus: 5.9 mg/dL — ABNORMAL HIGH (ref 2.5–4.6)

## 2021-07-10 LAB — PROTIME-INR
INR: 1.2 (ref 0.8–1.2)
Prothrombin Time: 15 seconds (ref 11.4–15.2)

## 2021-07-10 LAB — COOXEMETRY PANEL
Carboxyhemoglobin: 0.8 % (ref 0.5–1.5)
Methemoglobin: 1.2 % (ref 0.0–1.5)
O2 Saturation: 78 %
Total hemoglobin: 8.8 g/dL — ABNORMAL LOW (ref 12.0–16.0)

## 2021-07-10 LAB — LACTIC ACID, PLASMA
Lactic Acid, Venous: 1.6 mmol/L (ref 0.5–1.9)
Lactic Acid, Venous: 1.1 mmol/L (ref 0.5–1.9)

## 2021-07-10 LAB — AMMONIA: Ammonia: 20 umol/L (ref 9–35)

## 2021-07-10 MED ORDER — CLOPIDOGREL BISULFATE 75 MG PO TABS
75.0000 mg | ORAL_TABLET | Freq: Every day | ORAL | Status: DC
  Administered 2021-07-10 – 2021-07-13 (×4): 75 mg
  Filled 2021-07-10 (×4): qty 1

## 2021-07-10 MED ORDER — HEPARIN SODIUM (PORCINE) 1000 UNIT/ML DIALYSIS
1000.0000 [IU] | INTRAMUSCULAR | Status: DC | PRN
  Administered 2021-07-17: 1000 [IU] via INTRAVENOUS_CENTRAL
  Filled 2021-07-10 (×4): qty 6

## 2021-07-10 MED ORDER — AMIODARONE IV BOLUS ONLY 150 MG/100ML
150.0000 mg | Freq: Once | INTRAVENOUS | Status: AC
  Administered 2021-07-10: 150 mg via INTRAVENOUS

## 2021-07-10 MED ORDER — CEFAZOLIN SODIUM-DEXTROSE 2-4 GM/100ML-% IV SOLN: 2.0000 g | Freq: Two times a day (BID) | INTRAVENOUS | Status: DC

## 2021-07-10 MED ORDER — B COMPLEX-C PO TABS
1.0000 | ORAL_TABLET | Freq: Every day | ORAL | Status: DC
  Administered 2021-07-10 – 2021-07-13 (×4): 1
  Filled 2021-07-10 (×4): qty 1

## 2021-07-10 MED ORDER — ETOMIDATE 2 MG/ML IV SOLN: 20.0000 mg | Freq: Once | INTRAVENOUS | Status: AC

## 2021-07-10 MED ORDER — SODIUM CHLORIDE 0.9 % FOR CRRT: INTRAVENOUS_CENTRAL | Status: DC | PRN

## 2021-07-10 MED ORDER — POLYETHYLENE GLYCOL 3350 17 G PO PACK
17.0000 g | PACK | Freq: Every day | ORAL | Status: DC
  Administered 2021-07-10: 17 g
  Filled 2021-07-10: qty 1

## 2021-07-10 MED ORDER — FENTANYL 2500MCG IN NS 250ML (10MCG/ML) PREMIX INFUSION
50.0000 ug/h | INTRAVENOUS | Status: DC
  Administered 2021-07-10 – 2021-07-14 (×8): 200 ug/h via INTRAVENOUS
  Administered 2021-07-14: 125 ug/h via INTRAVENOUS
  Filled 2021-07-10 (×10): qty 250

## 2021-07-10 MED ORDER — SODIUM BICARBONATE 8.4 % IV SOLN: 50.0000 meq | Freq: Once | INTRAVENOUS | Status: AC

## 2021-07-10 MED ORDER — ROCURONIUM BROMIDE 10 MG/ML (PF) SYRINGE
PREFILLED_SYRINGE | INTRAVENOUS | Status: AC
  Administered 2021-07-10: 50 mg via INTRAVENOUS
  Filled 2021-07-10: qty 10

## 2021-07-10 MED ORDER — FENTANYL CITRATE PF 50 MCG/ML IJ SOSY
PREFILLED_SYRINGE | INTRAMUSCULAR | Status: AC
  Filled 2021-07-10: qty 2

## 2021-07-10 MED ORDER — ASPIRIN EC 81 MG PO TBEC: 81.0000 mg | DELAYED_RELEASE_TABLET | Freq: Every day | ORAL | Status: DC

## 2021-07-10 MED ORDER — DOCUSATE SODIUM 50 MG/5ML PO LIQD
100.0000 mg | Freq: Two times a day (BID) | ORAL | Status: DC
  Administered 2021-07-10 – 2021-07-13 (×8): 100 mg
  Filled 2021-07-10 (×8): qty 10

## 2021-07-10 MED ORDER — DEXMEDETOMIDINE HCL IN NACL 400 MCG/100ML IV SOLN
0.0000 ug/kg/h | INTRAVENOUS | Status: AC
Start: 2021-07-10 — End: 2021-07-13
  Administered 2021-07-10: 0.4 ug/kg/h via INTRAVENOUS
  Administered 2021-07-10: 0.8 ug/kg/h via INTRAVENOUS
  Administered 2021-07-10: 0.5 ug/kg/h via INTRAVENOUS
  Administered 2021-07-11 (×2): 1.2 ug/kg/h via INTRAVENOUS
  Administered 2021-07-11: 1 ug/kg/h via INTRAVENOUS
  Administered 2021-07-11 (×3): 1.2 ug/kg/h via INTRAVENOUS
  Administered 2021-07-12 – 2021-07-13 (×4): 0.8 ug/kg/h via INTRAVENOUS
  Filled 2021-07-10 (×4): qty 100
  Filled 2021-07-10: qty 200
  Filled 2021-07-10 (×4): qty 100
  Filled 2021-07-10 (×2): qty 200

## 2021-07-10 MED ORDER — FENTANYL BOLUS VIA INFUSION
50.0000 ug | INTRAVENOUS | Status: DC | PRN
  Administered 2021-07-10 – 2021-07-11 (×2): 100 ug via INTRAVENOUS
  Administered 2021-07-12 (×5): 50 ug via INTRAVENOUS
  Filled 2021-07-10: qty 100

## 2021-07-10 MED ORDER — PRISMASOL BGK 4/2.5 32-4-2.5 MEQ/L REPLACEMENT SOLN: Status: DC

## 2021-07-10 MED ORDER — VITAL HIGH PROTEIN PO LIQD: 1000.0000 mL | ORAL | Status: DC

## 2021-07-10 MED ORDER — ETOMIDATE 2 MG/ML IV SOLN
10.0000 mg | Freq: Once | INTRAVENOUS | Status: AC
  Administered 2021-07-10: 10 mg via INTRAVENOUS

## 2021-07-10 MED ORDER — FUROSEMIDE 10 MG/ML IJ SOLN
120.0000 mg | Freq: Once | INTRAVENOUS | Status: AC
  Administered 2021-07-10: 120 mg via INTRAVENOUS
  Filled 2021-07-10: qty 10

## 2021-07-10 MED ORDER — MIDAZOLAM HCL 2 MG/2ML IJ SOLN
INTRAMUSCULAR | Status: AC
  Filled 2021-07-10: qty 4

## 2021-07-10 MED ORDER — FENTANYL CITRATE PF 50 MCG/ML IJ SOSY: 150.0000 ug | PREFILLED_SYRINGE | Freq: Once | INTRAMUSCULAR | Status: AC

## 2021-07-10 MED ORDER — AMIODARONE IV BOLUS ONLY 150 MG/100ML: 150.0000 mg | Freq: Once | INTRAVENOUS | Status: DC

## 2021-07-10 MED ORDER — ETOMIDATE 2 MG/ML IV SOLN
INTRAVENOUS | Status: AC
  Administered 2021-07-10: 20 mg via INTRAVENOUS
  Filled 2021-07-10: qty 20

## 2021-07-10 MED ORDER — MIDAZOLAM HCL 2 MG/2ML IJ SOLN: 2.0000 mg | Freq: Once | INTRAMUSCULAR | Status: AC

## 2021-07-10 MED ORDER — ROCURONIUM BROMIDE 10 MG/ML (PF) SYRINGE
50.0000 mg | PREFILLED_SYRINGE | Freq: Once | INTRAVENOUS | Status: AC
  Administered 2021-07-10: 50 mg via INTRAVENOUS

## 2021-07-10 MED ORDER — CHLORHEXIDINE GLUCONATE 0.12% ORAL RINSE (MEDLINE KIT)
15.0000 mL | Freq: Two times a day (BID) | OROMUCOSAL | Status: DC
  Administered 2021-07-10 – 2021-07-15 (×11): 15 mL via OROMUCOSAL

## 2021-07-10 MED ORDER — PRISMASOL BGK 4/2.5 32-4-2.5 MEQ/L EC SOLN: Status: DC

## 2021-07-10 MED ORDER — HEPARIN SODIUM (PORCINE) 1000 UNIT/ML DIALYSIS
1000.0000 [IU] | INTRAMUSCULAR | Status: DC | PRN
  Administered 2021-07-11: 3000 [IU] via INTRAVENOUS_CENTRAL
  Administered 2021-07-17: 1000 [IU] via INTRAVENOUS_CENTRAL
  Administered 2021-07-18: 3000 [IU] via INTRAVENOUS_CENTRAL
  Filled 2021-07-10 (×4): qty 6

## 2021-07-10 MED ORDER — MIDAZOLAM HCL 2 MG/2ML IJ SOLN
2.0000 mg | INTRAMUSCULAR | Status: DC | PRN
  Administered 2021-07-11: 2 mg via INTRAVENOUS
  Filled 2021-07-10: qty 2

## 2021-07-10 MED ORDER — MIDAZOLAM HCL 2 MG/2ML IJ SOLN
INTRAMUSCULAR | Status: AC
  Administered 2021-07-10: 2 mg via INTRAVENOUS
  Filled 2021-07-10: qty 2

## 2021-07-10 MED ORDER — MIDAZOLAM HCL 2 MG/2ML IJ SOLN: 2.0000 mg | INTRAMUSCULAR | Status: DC | PRN

## 2021-07-10 MED ORDER — VITAL 1.5 CAL PO LIQD
1000.0000 mL | ORAL | Status: DC
  Administered 2021-07-10: 1000 mL

## 2021-07-10 MED ORDER — SODIUM CHLORIDE 0.9 % IV SOLN
2.0000 g | Freq: Two times a day (BID) | INTRAVENOUS | Status: AC
  Administered 2021-07-10 – 2021-07-14 (×10): 2 g via INTRAVENOUS
  Filled 2021-07-10 (×9): qty 2

## 2021-07-10 MED ORDER — AMIODARONE HCL IN DEXTROSE 360-4.14 MG/200ML-% IV SOLN
INTRAVENOUS | Status: AC
  Administered 2021-07-10: 150 mg via INTRAVENOUS
  Filled 2021-07-10: qty 200

## 2021-07-10 MED ORDER — ROCURONIUM BROMIDE 10 MG/ML (PF) SYRINGE: 50.0000 mg | PREFILLED_SYRINGE | Freq: Once | INTRAVENOUS | Status: AC

## 2021-07-10 MED ORDER — KETAMINE HCL 50 MG/5ML IJ SOSY
PREFILLED_SYRINGE | INTRAMUSCULAR | Status: AC
  Filled 2021-07-10: qty 5

## 2021-07-10 MED ORDER — AMIODARONE LOAD VIA INFUSION
150.0000 mg | Freq: Once | INTRAVENOUS | Status: AC
  Filled 2021-07-10: qty 83.34

## 2021-07-10 MED ORDER — PANTOPRAZOLE 2 MG/ML SUSPENSION
40.0000 mg | Freq: Every day | ORAL | Status: DC
  Administered 2021-07-10 – 2021-07-13 (×4): 40 mg
  Filled 2021-07-10 (×3): qty 20

## 2021-07-10 MED ORDER — FENTANYL CITRATE PF 50 MCG/ML IJ SOSY
PREFILLED_SYRINGE | INTRAMUSCULAR | Status: AC
  Administered 2021-07-10: 150 ug via INTRAVENOUS
  Filled 2021-07-10: qty 2

## 2021-07-10 MED ORDER — AMIODARONE LOAD VIA INFUSION
150.0000 mg | Freq: Once | INTRAVENOUS | Status: AC
  Administered 2021-07-10: 150 mg via INTRAVENOUS
  Filled 2021-07-10: qty 83.34

## 2021-07-10 MED ORDER — FENTANYL CITRATE PF 50 MCG/ML IJ SOSY
50.0000 ug | PREFILLED_SYRINGE | Freq: Once | INTRAMUSCULAR | Status: AC
Start: 2021-07-10 — End: 2021-07-10
  Administered 2021-07-10: 50 ug via INTRAVENOUS

## 2021-07-10 MED ORDER — FENTANYL BOLUS VIA INFUSION
50.0000 ug | Freq: Once | INTRAVENOUS | Status: AC
  Administered 2021-07-10: 50 ug via INTRAVENOUS

## 2021-07-10 MED ORDER — ORAL CARE MOUTH RINSE
15.0000 mL | OROMUCOSAL | Status: DC
  Administered 2021-07-10 – 2021-07-15 (×52): 15 mL via OROMUCOSAL

## 2021-07-10 MED ORDER — AMIODARONE HCL IN DEXTROSE 360-4.14 MG/200ML-% IV SOLN
30.0000 mg/h | INTRAVENOUS | Status: DC
  Administered 2021-07-10 – 2021-07-11 (×7): 60 mg/h via INTRAVENOUS
  Administered 2021-07-12: 30 mg/h via INTRAVENOUS
  Administered 2021-07-12: 60 mg/h via INTRAVENOUS
  Filled 2021-07-10 (×8): qty 200

## 2021-07-10 MED ORDER — PROSOURCE TF PO LIQD
45.0000 mL | Freq: Two times a day (BID) | ORAL | Status: DC
  Administered 2021-07-10: 45 mL
  Filled 2021-07-10: qty 45

## 2021-07-10 MED ORDER — SUCCINYLCHOLINE CHLORIDE 200 MG/10ML IV SOSY
PREFILLED_SYRINGE | INTRAVENOUS | Status: AC
  Filled 2021-07-10: qty 10

## 2021-07-10 MED ORDER — SODIUM BICARBONATE 8.4 % IV SOLN
INTRAVENOUS | Status: AC
  Administered 2021-07-10: 50 meq via INTRAVENOUS
  Filled 2021-07-10: qty 50

## 2021-07-10 MED ORDER — ASPIRIN 81 MG PO CHEW
81.0000 mg | CHEWABLE_TABLET | Freq: Every day | ORAL | Status: DC
  Administered 2021-07-11 – 2021-07-13 (×3): 81 mg
  Filled 2021-07-10 (×3): qty 1

## 2021-07-10 MED FILL — Sodium Chloride IV Soln 0.9%: INTRAVENOUS | Qty: 2000 | Status: AC

## 2021-07-10 MED FILL — Lidocaine HCl Local Preservative Free (PF) Inj 2%: INTRAMUSCULAR | Qty: 15 | Status: AC

## 2021-07-10 MED FILL — Heparin Sodium (Porcine) Inj 1000 Unit/ML: INTRAMUSCULAR | Qty: 10 | Status: AC

## 2021-07-10 MED FILL — Mannitol IV Soln 20%: INTRAVENOUS | Qty: 500 | Status: AC

## 2021-07-10 MED FILL — Potassium Chloride Inj 2 mEq/ML: INTRAVENOUS | Qty: 40 | Status: AC

## 2021-07-10 MED FILL — Heparin Sodium (Porcine) Inj 1000 Unit/ML: Qty: 1000 | Status: AC

## 2021-07-10 MED FILL — Electrolyte-R (PH 7.4) Solution: INTRAVENOUS | Qty: 3000 | Status: AC

## 2021-07-10 NOTE — Progress Notes (Signed)
Pharmacy Antibiotic Note  Billy [name] is a 55 y.o. male admitted on 07/07/2021 with suspected pneumonia.  Pharmacy has been consulted for cefepime dosing.  Patient is s/p CABG, MAZE, and atriaclip on 10/25 and has developed worsening renal function and respiratory status. Has been re-intubated overnight 10/26 into 10/27. Respiratory cultures sent.   Now starting on CRRT on 10/27 as is anuric. Cefepime dosing per CRRT. Will follow-up duration of therapy and monitor patients clinical status.  Plan: Cefepime 2g IV every 12 hours  Follow-up micro, WBCs, fever curve, and clinical status  Height: 6\' 2"  (188 cm) Weight: 119.4 kg (263 lb 3.7 oz) IBW/kg (Calculated) : 82.2  Temp (24hrs), Avg:98.2 F (36.8 C), Min:97 F (36.1 C), Max:99.9 F (37.7 C)  Recent Labs  Lab 07/08/21 1454 07/08/21 1743 07/09/21 0334 07/09/21 1625 07/09/21 2020 07/09/21 2248 07/10/21 0409  WBC  --  11.8* 12.4* 15.6*  --  19.1* 16.3*  CREATININE 2.10*  --  3.07* 4.41* 4.70*  --  5.07*    Estimated Creatinine Clearance: 22.3 mL/min (A) (by C-G formula based on SCr of 5.07 mg/dL (H)).    No Known Allergies  Antimicrobials this admission: Cefazolin surgical ppx- now d/c with cefepime  10/27 cefepime >>    Microbiology results: 10/27 Sputum: pending  10/25 MRSA PCR: neg  Thank you for allowing pharmacy to be a part of this patient's care.  Anderson Malta A Derek Laughter 07/10/2021 11:24 AM

## 2021-07-10 NOTE — Consult Note (Signed)
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 12:03 PM  Result Value Ref Range   Glucose-Capillary 130 (H) 70 - 99 mg/dL    Comment: Glucose reference range applies only to samples taken after fasting for at least 8 hours.  Glucose, capillary     Status: Abnormal   Collection Time: 07/09/21  3:56 PM  Result Value Ref Range   Glucose-Capillary 178 (H) 70 - 99 mg/dL    Comment: Glucose reference range applies only to samples taken after fasting for at least 8 hours.  CBC     Status: Abnormal    Collection Time: 07/09/21  4:25 PM  Result Value Ref Range   WBC 15.6 (H) 4.0 - 10.5 K/uL   RBC 2.86 (L) 4.22 - 5.81 MIL/uL   Hemoglobin 9.1 (L) 13.0 - 17.0 g/dL   HCT 27.4 (L) 39.0 - 52.0 %   MCV 95.8 80.0 - 100.0 fL   MCH 31.8 26.0 - 34.0 pg   MCHC 33.2 30.0 - 36.0 g/dL   RDW 12.8 11.5 - 15.5 %   Platelets 181 150 - 400 K/uL   nRBC 0.0 0.0 - 0.2 %    Comment: Performed at [facility], [place] [address], [place], [place] [address]  Basic metabolic panel     Status: Abnormal   Collection Time:  07/09/21  4:25 PM  Result Value Ref Range   Sodium 135 135 - 145 mmol/L   Potassium 5.3 (H) 3.5 - 5.1 mmol/L   Chloride 106 98 - 111 mmol/L   CO2 18 (L) 22 - 32 mmol/L   Glucose, Bld 300 (H) 70 - 99 mg/dL    Comment: Glucose reference range applies only to samples taken after fasting for at least 8 hours.   BUN 32 (H) 6 - 20 mg/dL   Creatinine, Ser 4.41 (H) 0.61 - 1.24 mg/dL   Calcium 7.5 (L) 8.9 - 10.3 mg/dL   GFR, Estimated 15 (L) >60 mL/min    Comment: (NOTE) Calculated using the CKD-EPI Creatinine Equation (2021)    Anion gap 11 5 - 15    Comment: Performed at [facility] [address] [place], [place] [address]  Magnesium     Status: Abnormal   Collection Time: 07/09/21  4:25 PM  Result Value Ref Range   Magnesium 2.5 (H) 1.7 - 2.4 mg/dL    Comment: Performed at [facility] [address], [place], [place] [address]  Glucose, capillary     Status: Abnormal   Collection Time: 07/09/21  7:41 PM  Result Value Ref Range   Glucose-Capillary 320 (H) 70 - 99 mg/dL    Comment: Glucose reference range applies only to samples taken after fasting for at least 8 hours.  I-STAT 7, (LYTES, BLD GAS, ICA, H+H)     Status: Abnormal   Collection Time: 07/09/21  8:13 PM  Result Value Ref Range   pH, Arterial 7.237 (L) 7.350 - 7.450   pCO2 arterial 39.0 32.0 - 48.0 mmHg   pO2, Arterial 62 (L) 83.0 - 108.0 mmHg   Bicarbonate 16.6 (L) 20.0 - 28.0 mmol/L   TCO2 18 (L) 22 - 32  mmol/L   O2 Saturation 87.0 %   Acid-base deficit 10.0 (H) 0.0 - 2.0 mmol/L   Sodium 135 135 - 145 mmol/L   Potassium 5.2 (H) 3.5 - 5.1 mmol/L   Calcium, Ion 1.10 (L) 1.15 - 1.40 mmol/L   HCT 26.0 (L) 39.0 - 52.0 %   Hemoglobin 8.8 (L) 13.0 - 17.0 g/dL   Patient temperature 37.0 C    Sample type ARTERIAL   Basic metabolic panel     Status: Abnormal   Collection Time: 07/09/21  8:20 PM  Result Value Ref Range   Sodium 134 (L) 135 - 145 mmol/L   Potassium 4.7 3.5 - 5.1 mmol/L   Chloride 104 98 - 111 mmol/L   CO2 20 (L) 22 - 32 mmol/L   Glucose, Bld 331 (H) 70 - 99 mg/dL    Comment: Glucose reference range applies only to samples taken after fasting for at least 8 hours.   BUN 33 (H) 6 - 20 mg/dL   Creatinine, Ser 4.70 (H) 0.61 - 1.24 mg/dL   Calcium 7.0 (L) 8.9 - 10.3 mg/dL   GFR, Estimated 14 (L) >60 mL/min    Comment: (NOTE) Calculated using the CKD-EPI Creatinine Equation (2021)    Anion gap 10 5 - 15    Comment: Performed at [facility] [address], [place], [place] [address]  Glucose, capillary     Status: Abnormal   Collection Time: 07/09/21  9:09 PM  Result Value Ref Range   Glucose-Capillary 288 (H) 70 - 99 mg/dL    Comment: Glucose reference range applies only to samples taken after fasting for at least 8  hours.  Glucose, capillary     Status: Abnormal   Collection Time: 07/09/21 10:00 PM  Result Value Ref Range   Glucose-Capillary 218 (H) 70 - 99 mg/dL    Comment: Glucose reference range applies only to samples taken after fasting for at least 8 hours.  I-STAT 7, (LYTES, BLD GAS, ICA, H+H)     Status: Abnormal   Collection Time: 07/09/21 10:00 PM  Result Value Ref Range   pH, Arterial 7.263 (L) 7.350 - 7.450   pCO2 arterial 34.4 32.0 - 48.0 mmHg   pO2, Arterial 68 (L) 83.0 - 108.0 mmHg   Bicarbonate 15.6 (L) 20.0 - 28.0 mmol/L   TCO2 17 (L) 22 - 32 mmol/L   O2 Saturation 91.0 %   Acid-base deficit 11.0 (H) 0.0 - 2.0 mmol/L   Sodium 142 135 - 145  mmol/L   Potassium 3.6 3.5 - 5.1 mmol/L   Calcium, Ion 0.95 (L) 1.15 - 1.40 mmol/L   HCT 21.0 (L) 39.0 - 52.0 %   Hemoglobin 7.1 (L) 13.0 - 17.0 g/dL   Patient temperature 36.6 C    Sample type ARTERIAL   CBC     Status: Abnormal   Collection Time: 07/09/21 10:48 PM  Result Value Ref Range   WBC 19.1 (H) 4.0 - 10.5 K/uL   RBC 2.95 (L) 4.22 - 5.81 MIL/uL   Hemoglobin 9.3 (L) 13.0 - 17.0 g/dL   HCT 27.6 (L) 39.0 - 52.0 %   MCV 93.6 80.0 - 100.0 fL   MCH 31.5 26.0 - 34.0 pg   MCHC 33.7 30.0 - 36.0 g/dL   RDW 12.6 11.5 - 15.5 %   Platelets 234 150 - 400 K/uL   nRBC 0.0 0.0 - 0.2 %    Comment: Performed at [facility], [place] [address], [place], [place] [address]  Glucose, capillary     Status: Abnormal   Collection Time: 07/09/21 11:08 PM  Result Value Ref Range   Glucose-Capillary 162 (H) 70 - 99 mg/dL    Comment: Glucose reference range applies only to samples taken after fasting for at least 8 hours.  Glucose, capillary     Status: Abnormal   Collection Time: 07/09/21 11:23 PM  Result Value Ref Range   Glucose-Capillary 255 (H) 70 - 99 mg/dL    Comment: Glucose reference range applies only to samples taken after fasting for at least 8 hours.  Glucose, capillary     Status: Abnormal   Collection Time: 07/10/21 12:25 AM  Result Value Ref Range   Glucose-Capillary 212 (H) 70 - 99 mg/dL    Comment: Glucose reference range applies only to samples taken after fasting for at least 8 hours.  I-STAT 7, (LYTES, BLD GAS, ICA, H+H)     Status: Abnormal   Collection Time: 07/10/21 12:26 AM  Result Value Ref Range   pH, Arterial 7.267 (L) 7.350 - 7.450   pCO2 arterial 52.4 (H) 32.0 - 48.0 mmHg   pO2, Arterial 65 (L) 83.0 - 108.0 mmHg   Bicarbonate 24.1 20.0 - 28.0 mmol/L   TCO2 26 22 - 32 mmol/L   O2 Saturation 90.0 %   Acid-base deficit 3.0 (H) 0.0 - 2.0 mmol/L   Sodium 139 135 - 145 mmol/L   Potassium 4.2 3.5 - 5.1 mmol/L   Calcium, Ion 1.09 (L) 1.15 - 1.40 mmol/L   HCT 25.0  (L) 39.0 - 52.0 %   Hemoglobin 8.5 (L) 13.0 - 17.0 g/dL   Patient temperature  36.2 C    Sample type ARTERIAL   Glucose, capillary     Status: Abnormal   Collection Time: 07/10/21  1:58 AM  Result Value Ref Range   Glucose-Capillary 151 (H) 70 - 99 mg/dL    Comment: Glucose reference range applies only to samples taken after fasting for at least 8 hours.  Glucose, capillary     Status: Abnormal   Collection Time: 07/10/21  3:02 AM  Result Value Ref Range   Glucose-Capillary 144 (H) 70 - 99 mg/dL    Comment: Glucose reference range applies only to samples taken after fasting for at least 8 hours.  I-STAT 7, (LYTES, BLD GAS, ICA, H+H)     Status: Abnormal   Collection Time: 07/10/21  3:03 AM  Result Value Ref Range   pH, Arterial 7.334 (L) 7.350 - 7.450   pCO2 arterial 30.3 (L) 32.0 - 48.0 mmHg   pO2, Arterial 111 (H) 83.0 - 108.0 mmHg   Bicarbonate 16.2 (L) 20.0 - 28.0 mmol/L   TCO2 17 (L) 22 - 32 mmol/L   O2 Saturation 98.0 %   Acid-base deficit 9.0 (H) 0.0 - 2.0 mmol/L   Sodium 145 135 - 145 mmol/L   Potassium 2.6 (LL) 3.5 - 5.1 mmol/L   Calcium, Ion 0.87 (LL) 1.15 - 1.40 mmol/L   HCT 16.0 (L) 39.0 - 52.0 %   Hemoglobin 5.4 (LL) 13.0 - 17.0 g/dL   Patient temperature 36.3 C    Sample type ARTERIAL    Comment NOTIFIED PHYSICIAN   I-STAT 7, (LYTES, BLD GAS, ICA, H+H)     Status: Abnormal   Collection Time: 07/10/21  3:13 AM  Result Value Ref Range   pH, Arterial 7.359 7.350 - 7.450   pCO2 arterial 48.0 32.0 - 48.0 mmHg   pO2, Arterial 89 83.0 - 108.0 mmHg   Bicarbonate 27.2 20.0 - 28.0 mmol/L   TCO2 29 22 - 32 mmol/L   O2 Saturation 97.0 %   Acid-Base Excess 1.0 0.0 - 2.0 mmol/L   Sodium 137 135 - 145 mmol/L   Potassium 4.1 3.5 - 5.1 mmol/L   Calcium, Ion 1.17 1.15 - 1.40 mmol/L   HCT 25.0 (L) 39.0 - 52.0 %   Hemoglobin 8.5 (L) 13.0 - 17.0 g/dL   Patient temperature 36.3 C    Sample type ARTERIAL   Glucose, capillary     Status: Abnormal   Collection Time: 07/10/21   4:03 AM  Result Value Ref Range   Glucose-Capillary 155 (H) 70 - 99 mg/dL    Comment: Glucose reference range applies only to samples taken after fasting for at least 8 hours.  Basic metabolic panel     Status: Abnormal   Collection Time: 07/10/21  4:09 AM  Result Value Ref Range   Sodium 137 135 - 145 mmol/L   Potassium 4.0 3.5 - 5.1 mmol/L   Chloride 101 98 - 111 mmol/L   CO2 24 22 - 32 mmol/L   Glucose, Bld 205 (H) 70 - 99 mg/dL    Comment: Glucose reference range applies only to samples taken after fasting for at least 8 hours.   BUN 37 (H) 6 - 20 mg/dL   Creatinine, Ser 5.07 (H) 0.61 - 1.24 mg/dL   Calcium 7.3 (L) 8.9 - 10.3 mg/dL   GFR, Estimated 13 (L) >60 mL/min    Comment: (NOTE) Calculated using the CKD-EPI Creatinine Equation (2021)    Anion gap 12 5 - 15    Comment: Performed  at [facility], [place] [address], [place], [place] [address]  CBC     Status: Abnormal   Collection Time: 07/10/21  4:09 AM  Result Value Ref Range   WBC 16.3 (H) 4.0 - 10.5 K/uL   RBC 2.79 (L) 4.22 - 5.81 MIL/uL   Hemoglobin 8.9 (L) 13.0 - 17.0 g/dL   HCT 25.8 (L) 39.0 - 52.0 %   MCV 92.5 80.0 - 100.0 fL   MCH 31.9 26.0 - 34.0 pg   MCHC 34.5 30.0 - 36.0 g/dL   RDW 12.6 11.5 - 15.5 %   Platelets 213 150 - 400 K/uL   nRBC 0.0 0.0 - 0.2 %    Comment: Performed at [facility], [place] [address], [place], [place] [address]  Magnesium     Status: None   Collection Time: 07/10/21  4:09 AM  Result Value Ref Range   Magnesium 2.3 1.7 - 2.4 mg/dL    Comment: Performed at [facility] [address], [place], [place] [address]  Glucose, capillary     Status: Abnormal   Collection Time: 07/10/21  5:14 AM  Result Value Ref Range   Glucose-Capillary 153 (H) 70 - 99 mg/dL    Comment: Glucose reference range applies only to samples taken after fasting for at least 8 hours.  Glucose, capillary     Status: Abnormal   Collection Time: 07/10/21  8:03 AM  Result Value Ref Range    Glucose-Capillary 112 (H) 70 - 99 mg/dL    Comment: Glucose reference range applies only to samples taken after fasting for at least 8 hours.  Glucose, capillary     Status: Abnormal   Collection Time: 07/10/21  8:40 AM  Result Value Ref Range   Glucose-Capillary 115 (H) 70 - 99 mg/dL    Comment: Glucose reference range applies only to samples taken after fasting for at least 8 hours.     ROS:  Review of systems not obtained due to patient factors.  Physical Exam: Vitals:   07/10/21 0815 07/10/21 0824  BP: (!) 120/58   Pulse: (!) 145   Resp: 18   Temp:    SpO2: 99% 99%     General: Pale, intubated and sedated, arousable with stimuli HEENT: Pupils are equally round and reactive to light, extraocular motions are intact, mucous membranes moist  Neck: Positive JVD Heart: Tachycardic, irregular Lungs: Coarse breath sounds bilaterally Abdomen: Distended, abdominal wall edema Extremities: Dependent edema Skin: Warm and dry Neuro: Sedated on vent  Assessment/Plan: 55 year old white male with diabetes, hypertension and mild CKD.  Has not developed acute on chronic renal failure in the setting of CABG postop 1.Renal-CKD at baseline, creatinine 1.6 in late August 2022.  Creatinine was 1.9 pre-CABG.  Has developed acute on chronic renal failure in the setting of hemodynamic instability status post CABG.  Most pressing issue is that he is an uric and volume overloaded necessitating the initiation of CRRT.  Catheter is in, orders were written.  No heparin, all 4 potassium bath, 50-100 mils of UF per hour 2. Hypertension/volume  -significant volume overload but also cardiogenic shock on pressors.  Ultrafiltration as tolerated 3.  Electrolytes- potassium okay -to use all 4 potassium bath-magnesium okay-phosphorus high-will decrease with CRRT 4. Anemia  -due to renal failure, surgery and situation.  Supportive care for now.     Louis Meckel 07/10/2021, 8:50 AM

## 2021-07-10 NOTE — Progress Notes (Signed)
Initial Nutrition Assessment  DOCUMENTATION CODES:   Not applicable  INTERVENTION:   Initiate trickle tube feeds via OG tube: - Vital 1.5 @ 20 ml/hr (480 ml/day)  Trickle tube feeding regimen provides 720 kcal, 32 grams of protein, and 367 ml of H2O.    RD will monitor for ability to advance tube feeds to goal regimen: - Vital 1.5 @ 55 ml/hr (1320 ml/day) - ProSource TF 90 ml QID  Goal tube feeding regimen would provide 2300 kcal, 177 grams of protein, and 1008 ml of H2O.   - B-complex with vitamin C to account for losses with CRRT  NUTRITION DIAGNOSIS:   Increased nutrient needs related to acute illness (AKI requiring CRRT) as evidenced by estimated needs.  GOAL:   Patient will meet greater than or equal to 90% of their needs  MONITOR:   Vent status, Labs, Weight trends, TF tolerance, I & O's  REASON FOR ASSESSMENT:   Ventilator, Consult Enteral/tube feeding initiation and management  ASSESSMENT:   55 year old male who presented to the ED on 10/24 with STEMI. PMH of atrial fibrillation, HTN, T2DM, depression, anxiety, CKD stage II.  10/24 - s/p LHC with PCI, found to have severe multivessel CAD 10/25 - s/p CABG x 4, Maze 10/26 - extubated 10/27 - intubated, CRRT start  Per notes, pt was extubated post-op day 1 but developed progressive shock requiring levophed and vasopressin with AKI and oliguria. Pt placed on BiPAP for hypoxia and eventually required intubation yesterday. Pt now with cardiogenic shock in the setting of anterior STEMI and AKI on CKD stage IIIb. Plan for CRRT start today. Advanced Heart Failure team following due to intra-op EF noted to be 20%.  Consult received for tube feeding initiation and management. Pt with OG tube in stomach, currently to low intermittent suction. Discussed tube feeds with CCM. With pt maxed on current pressors, will start trickle feeds today and monitor for ability to advance tube feeds to goal rate.  Admit weight: 115.7  kg Current weight: 119.4 kg  Patient is currently intubated on ventilator support MV: 10.2 L/min Temp (24hrs), Avg:98.1 F (36.7 C), Min:97 F (36.1 C), Max:99.1 F (37.3 C) BP (a-line): 123/57 MAP (a-line): 78  Drips: Precedex Fentanyl Amiodarone Levophed Vasopressin Sodium bicarb: off Insulin: 3.4 units/hr  Medications reviewed and include: dulcolax, colace, IV reglan 10 mg x 3, protonix, miralax, IV abx, IV lasix  Labs reviewed: BUN 37, creatinine 5.07, hemoglobin 8.9, phosphorus 5.9 CBG's: 82-255 x 12 hours  UOP: 101 ml x 24 hours CT: 310 ml x 24 hours I/O's: +7.6 L since admit  NUTRITION - FOCUSED PHYSICAL EXAM:  Flowsheet Row Most Recent Value  Orbital Region No depletion  Upper Arm Region No depletion  Thoracic and Lumbar Region No depletion  Buccal Region Unable to assess  Temple Region No depletion  Clavicle Bone Region No depletion  Clavicle and Acromion Bone Region No depletion  Scapular Bone Region Unable to assess  Dorsal Hand No depletion  Patellar Region No depletion  Anterior Thigh Region No depletion  Posterior Calf Region No depletion  Edema (RD Assessment) Mild  [BUE, BLE]  Hair Reviewed  Eyes Unable to assess  Mouth Unable to assess  Skin Reviewed  Nails Reviewed       Diet Order:   Diet Order             Diet NPO time specified  Diet effective now  EDUCATION NEEDS:   Not appropriate for education at this time  Skin:  Skin Assessment: Skin Integrity Issues: Incisions: chest, L leg, R arm  Last BM:  no documented BM  Height:   Ht Readings from Last 1 Encounters:  07/08/21 6\' 2"  (1.88 m)    Weight:   Wt Readings from Last 1 Encounters:  07/10/21 119.4 kg    Ideal Body Weight:  86.4 kg  BMI:  Body mass index is 33.8 kg/m.  Estimated Nutritional Needs:   Kcal:  2100-2300  Protein:  170-190 grams  Fluid:  >/= 2.0 L    Gustavus Bryant, MS, RD, LDN Inpatient Clinical Dietitian Please  see AMiON for contact information.

## 2021-07-10 NOTE — Progress Notes (Signed)
Trial of lasix 120 x 1 ordered Will need renal consult in am  Seerat Peaden V. Elsworth Soho MD

## 2021-07-10 NOTE — Progress Notes (Signed)
NAME:  Billy [name], MRN:  [id number], DOB:  November 11, 1965, LOS: 3 ADMISSION DATE:  07/07/2021, CONSULTATION DATE:  07/10/2021  REFERRING MD:  Lynnette Caffey, CHIEF COMPLAINT:  cardiogenic shock , on bipap   History of Present Illness:  55 year old diabetic, hypertensive admitted 10/24 with STEMI.  He had recent admission 10/11 for chest pain and hypertensive urgency with normal LVEF on echo.  He underwent left heart cath which showed three-vessel disease, underwent PCI with DES to left circumflex followed by CABG x4 with radial harvest and maze procedure.  Intra-Op EF was noted to be 20%.  He was extubated postop day 1 but developed progressive shock requiring Levophed and vasopressin with AKI and oliguria.  He was placed on BiPAP for hypoxia, ABG showed metabolic acidosis for which she was given 3 A of bicarb and PCCM consulted Seen by heart failure service, coox 56% started on milrinone  Pertinent  Medical History  Diabetes Hypertension CKD stage II -Baseline creatinine 1.8 Paroxysmal atrial fibrillation Anxiety -on Xanax 2 mg thrice daily  Significant Hospital Events: Including procedures, antibiotic start and stop dates in addition to other pertinent events   10/24 admitted with STEMI  10/27 PCCM consulted, due to respiratory issues intubated early morning    Interim History / Subjective:  Sedated on vent in A-fib RVR currently   Objective   Blood pressure (!) 111/58, pulse 81, temperature 98.2 F (36.8 C), resp. rate 20, height 6\' 2"  (1.88 m), weight 119.4 kg, SpO2 99 %. CVP:  [0 mmHg-19 mmHg] 9 mmHg  Vent Mode: PRVC FiO2 (%):  [70 %-100 %] 70 % Set Rate:  [20 bmp] 20 bmp Vt Set:  [550 mL] 550 mL PEEP:  [15 cmH20] 15 cmH20 Plateau Pressure:  [22 cmH20-26 cmH20] 22 cmH20   Intake/Output Summary (Last 24 hours) at 07/10/2021 0708 Last data filed at 07/10/2021 0600 Gross per 24 hour  Intake 2721.38 ml  Output 411 ml  Net 2310.38 ml    Filed Weights   07/08/21 1049  07/09/21 0407 07/10/21 0500  Weight: 115.7 kg 120.8 kg 119.4 kg    Examination: General: Acute on chronically ill appearing middle aged male lying in bed on mechanical ventilation, in NAD HEENT: ETT, MM pink/moist, PERRL,  Neuro: Sedated on vent,  CV: s1s2 regular rate and rhythm, no murmur, rubs, or gallops,  PULM: On vent, FIO2 90%, crackles bilaterally,  GI: soft, bowel sounds active in all 4 quadrants, non-tender, non-distended, tolerating TF Extremities: warm/dry, no edema  Skin: no rashes or lesions  Resolved Hospital Problem list     Assessment & Plan:  Cardiogenic shock in the setting of anterior STEMI  CABG x4 with radial harvest -ECHO 06/25/2021 EF 50-55%. Intraoperative TEE 20% Paroxsymal A-fib -On carvedilol at baseline  P: Primary management per cardiology and heart failure  Continue pressors for MAP goal greater than 65 Remains on Milrinone  Monitor CVP and COOX Continuous telemetry  Antiplatelets vs anticoagulation per heart failure  May need cardioversion   Acute hypoxic respiratory failure  -related to acute systolic heart failure P: Continue ventilator support with lung protective strategies  Wean PEEP and FiO2 for sats greater than 90%. Head of bed elevated 30 degrees. Plateau pressures less than 30 cm H20.  Follow intermittent chest x-ray and ABG.   SAT/SBT as tolerated, mentation preclude extubation  Ensure adequate pulmonary hygiene  Follow cultures  VAP bundle in place  PAD protocol  AKI superimposed on CKD stage IIb -ATN related to contrast and  hypotension P: Consult cardiology  Follow renal function  Monitor urine output Trend Bmet Avoid nephrotoxins Ensure adequate renal perfusion  Remains on bicarb drip  Trial of Lasix overnight   Type 2 diabetes  -Home medications include tradjenta  P: On insulin drip for tight control currently  CBG check q4  At risk malnutrition  P: Start tube feeds Supplement protein   Best Practice     Diet/type: NPO DVT prophylaxis: prophylactic heparin  GI prophylaxis: N/A Lines: Central line Foley:  Yes, and it is still needed Code Status:  full code Last date of multidisciplinary goals of care discussion [NA]  Critical care time:    Performed by: Laykin Rainone D. Harris  Total critical care time: 45 minutes  Critical care time was exclusive of separately billable procedures and treating other patients.  Critical care was necessary to treat or prevent imminent or life-threatening deterioration.  Critical care was time spent personally by me on the following activities: development of treatment plan with patient and/or surrogate as well as nursing, discussions with consultants, evaluation of patient's response to treatment, examination of patient, obtaining history from patient or surrogate, ordering and performing treatments and interventions, ordering and review of laboratory studies, ordering and review of radiographic studies, pulse oximetry and re-evaluation of patient's condition.   Caryle Helgeson D. Kenton Kingfisher, NP-C [facility] Personal contact information can be found on Amion  07/10/2021, 7:37 AM

## 2021-07-10 NOTE — Plan of Care (Signed)
  Interdisciplinary Goals of Care Family Meeting   Date carried out: 07/10/2021  Location of the meeting: Unit  Member's involved: Nurse Practitioner and Family Member or next of kin  Durable Power of Attorney or acting medical decision maker: Shared among sisters, Billy [name] and Billy [name]   Discussion: While re-rounding on Billy [name] I engaged with patients sisters regarding daily updates. While reviewing patients current critical illness with multi-system organ dysfunction Billy [name] and Billy [name] had questions regarding Code Status. They understand how sick Billy [name] is and are very conflicted over the continued aggressive interventions. They both feel that [name] would not want this aggressive interventions for a prolonged period. They fell comfortable supporting him currently but feel if he were to worsen that they would not want chest compression to try and revive him. At this time in depth Code discussion was held with family and all options were discussed, Billy [name] and Billy [name] wish to proceed with a LCB to include no chest compressions.   Billy [name] and Billy [name] would also want to make the aggressive intervention a limited time trial. They want to focus on quality of like and comfort but are willing to trial aggressive measures over the next few days.   This information was relayed to the primary team, will wait for reply before placing any orders to change code status   Code status: Full Code but family wishes for LCB - no chest compression   Disposition: Continue current acute care  Time spent for the meeting: 24  Billy Caraveo D. Kenton Kingfisher, NP-C [facility] Personal contact information can be found on Amion  07/10/2021, 4:09 PM

## 2021-07-10 NOTE — Progress Notes (Signed)
[address]       [place] [address]             [phone]                 2 Days Post-Op Procedure(s) (LRB): CORONARY ARTERY BYPASS GRAFTING (CABG) TIMES FOUR USING LEFT INTERNAL MAMMARY ARTERY, GREATER SAPHENOUS VEIN HARVESTED ENDOSCOPICALLY, AND LEFT RADIAL ARTERY HARVESTED OPEN (N/A) TRANSESOPHAGEAL ECHOCARDIOGRAM (TEE) (N/A) RADIAL ARTERY HARVEST (Left) ENDOVEIN HARVEST OF GREATER SAPHENOUS VEIN (Right) MAZE   Events: Aneuric and acidotic overnight Intubated On pressor support  Runs of afib, converted with amio bolus _______________________________________________________________ Vitals: BP (!) 111/58   Pulse 81   Temp 98.2 F (36.8 C)   Resp 20   Ht 6\' 2"  (1.88 m)   Wt 119.4 kg   SpO2 99%   BMI 33.80 kg/m  Filed Weights   07/08/21 1049 07/09/21 0407 07/10/21 0500  Weight: 115.7 kg 120.8 kg 119.4 kg     - Neuro: alert NAD  - Cardiovascular: sinus  Drips: amio 60 ,bicarb, milr 0.25, levo 16, vaso 0.04   CVP:  [0 mmHg-19 mmHg] 9 mmHg  - Pulm:  Vent Mode: PRVC FiO2 (%):  [70 %-100 %] 70 % Set Rate:  [20 bmp] 20 bmp Vt Set:  [550 mL] 550 mL PEEP:  [15 cmH20] 15 cmH20 Plateau Pressure:  [22 cmH20-26 cmH20] 22 cmH20    ABG    Component Value Date/Time   PHART 7.359 07/10/2021 0313   PCO2ART 48.0 07/10/2021 0313   PO2ART 89 07/10/2021 0313   HCO3 27.2 07/10/2021 0313   TCO2 29 07/10/2021 0313   ACIDBASEDEF 9.0 (H) 07/10/2021 0303   O2SAT 97.0 07/10/2021 0313    - Abd: ND - Extremity: warm  .Intake/Output      10/26 0701 10/27 0700 10/27 0701 10/28 0700   I.V. (mL/kg) 2321.4 (19.4)    Blood     IV Piggyback 400    Total Intake(mL/kg) 2721.4 (22.8)    Urine (mL/kg/hr) 101 (0)    Emesis/NG output     Blood     Chest Tube 310    Total Output 411    Net +2310.4            _______________________________________________________________ Labs: CBC Latest Ref Rng & Units 07/10/2021 07/10/2021 07/10/2021  WBC 4.0  - 10.5 K/uL 16.3(H) - -  Hemoglobin 13.0 - 17.0 g/dL 8.9(L) 8.5(L) 5.4(LL)  Hematocrit 39.0 - 52.0 % 25.8(L) 25.0(L) 16.0(L)  Platelets 150 - 400 K/uL 213 - -   CMP Latest Ref Rng & Units 07/10/2021 07/10/2021 07/10/2021  Glucose 70 - 99 mg/dL 205(H) - -  BUN 6 - 20 mg/dL 37(H) - -  Creatinine 0.61 - 1.24 mg/dL 5.07(H) - -  Sodium 135 - 145 mmol/L 137 137 145  Potassium 3.5 - 5.1 mmol/L 4.0 4.1 2.6(LL)  Chloride 98 - 111 mmol/L 101 - -  CO2 22 - 32 mmol/L 24 - -  Calcium 8.9 - 10.3 mg/dL 7.3(L) - -  Total Protein 6.5 - 8.1 g/dL - - -  Total Bilirubin 0.3 - 1.2 mg/dL - - -  Alkaline Phos 38 - 126 U/L - - -  AST 15 - 41 U/L - - -  ALT 0 - 44 U/L - - -    CXR: PV congestion  _______________________________________________________________  Assessment and Plan: POD 2 s/p CABG, MAZE, atriclip  Neuro: will sedate pt today CV: will continue amio.  Wean pressors as tolerated.  Will d/c milr, as this could be contributing to vasoplegia.  Will add dobutamine if inotropic support is needed Pulm: continue vent support Renal: creat up. Aneuric.  Will place vascath and start CRRT GI: NPO for now while on high dose pressors.  Will consider trickle feeds once we come down Heme: stable ID: afebrile Endo: continue insulin gtt Dispo: continue ICU care   Oliverio Cho O Caitlyne Ingham 07/10/2021 8:25 AM

## 2021-07-10 NOTE — Progress Notes (Signed)
Rpt ABg reviewed , hypercarbic >>Intubated, prolonged desat & required PEEP 15 to recruit. ETT developed leak, changed over bougie, adjusted to 27 cm at lip Vents ettings reviewed &a djusted Deep sedation with fent gtt, goal RASS -2 to -3 OG inserted using laryngoscopy CXR reviewed >> tubes in position  Additonal cc time independednt of procedures x 37m  Billy [name] V. Elsworth Soho MD

## 2021-07-10 NOTE — Progress Notes (Signed)
Dr. Jeannene Patella aware of post-intubation ABG results.  Per MD, lower FiO2 to 90%.  No other vent changes at this time.  Notified bedside RN V. Demaris Callander.

## 2021-07-10 NOTE — Procedures (Signed)
Central Venous Catheter Insertion Procedure Note  ZIAN DELAIR  [id number]  [birth year]  Date:07/10/21  Time:9:02 AM   Provider Performing:Boniface Goffe D. Harris   Procedure: Insertion of Non-tunneled Central Venous Catheter(36556)with US guidance (88416)      Indication(s) Hemodialysis  Consent Unable to obtain consent due to emergent nature of procedure.  Anesthesia Topical only with 1% lidocaine   Timeout Verified patient identification, verified procedure, site/side was marked, verified correct patient position, special equipment/implants available, medications/allergies/relevant history reviewed, required imaging and test results available.  Sterile Technique Maximal sterile technique including full sterile barrier drape, hand hygiene, sterile gown, sterile gloves, mask, hair covering, sterile ultrasound probe cover (if used).  Procedure Description Area of catheter insertion was cleaned with chlorhexidine and draped in sterile fashion.   With real-time ultrasound guidance a HD catheter was placed into the left internal jugular vein.  Nonpulsatile blood flow and easy flushing noted in all ports.  The catheter was sutured in place and sterile dressing applied.  Complications/Tolerance None; patient tolerated the procedure well. Chest X-ray is ordered to verify placement for internal jugular or subclavian cannulation.  Chest x-ray is not ordered for femoral cannulation.  EBL Minimal  Specimen(s) None  Chelsea Nusz D. Kenton Kingfisher, NP-C [facility] Personal contact information can be found on Amion  07/10/2021, 9:03 AM

## 2021-07-10 NOTE — Procedures (Signed)
Intubation Procedure Note  Billy [name]  [id number]  02-Aug-1966  Date:07/10/21  Time:1:16 AM   Provider Performing:Billy [name] V. Billy [name]    Procedure: Intubation (31500)  Indication(s) Respiratory Failure  Consent Risks of the procedure as well as the alternatives and risks of each were explained to the patient and/or caregiver.  Consent for the procedure was obtained and is signed in the bedside chart   Anesthesia Etomidate, Versed, Fentanyl, and Rocuronium   Time Out Verified patient identification, verified procedure, site/side was marked, verified correct patient position, special equipment/implants available, medications/allergies/relevant history reviewed, required imaging and test results available.   Sterile Technique Usual hand hygeine, masks, and gloves were used   Procedure Description Patient positioned in bed supine.  Sedation given as noted above.  Patient was intubated with endotracheal tube using Glidescope.  View was Grade 2 only posterior commissure .  Number of attempts was 1.  Colorimetric CO2 detector was consistent with tracheal placement.   Complications/Tolerance None; patient tolerated the procedure well. Chest X-ray is ordered to verify placement.   EBL Minimal   Specimen(s) None   Billy [name] V. Billy Soho MD

## 2021-07-10 NOTE — Progress Notes (Addendum)
Advanced Heart Failure Rounding Note  PCP-Cardiologist: None   Subjective:     10/25 S/P CABG x4 with MAZE . Intra Op EF 20%.  10/27  Intubated. Hypotensive. Placed on Norepi+ Vaso. Recurrent A fib with amio increased to 60 mg.   Currently on vasopressin 0.04 units, milrinone 0.25 mcg, norepi 17 mcg and amio 60 mg   Back in A fib RVR while rounding. Given bolus amio x1.   Flow Trak - SVR 692   Awake on vent.    Objective:   Weight Range: 119.4 kg Body mass index is 33.8 kg/m.   Vital Signs:   Temp:  [97 F (36.1 C)-99.9 F (37.7 C)] 98.2 F (36.8 C) (10/27 0645) Pulse Rate:  [66-167] 81 (10/27 0645) Resp:  [0-29] 20 (10/27 0645) BP: (91-138)/(52-87) 111/58 (10/27 0600) SpO2:  [83 %-100 %] 99 % (10/27 0645) Arterial Line BP: (88-172)/(46-71) 110/55 (10/27 0645) FiO2 (%):  [70 %-100 %] 70 % (10/27 0445) Weight:  [119.4 kg] 119.4 kg (10/27 0500) Last BM Date:  (PTA)  Weight change: Filed Weights   07/08/21 1049 07/09/21 0407 07/10/21 0500  Weight: 115.7 kg 120.8 kg 119.4 kg    Intake/Output:   Intake/Output Summary (Last 24 hours) at 07/10/2021 0728 Last data filed at 07/10/2021 0600 Gross per 24 hour  Intake 2721.38 ml  Output 411 ml  Net 2310.38 ml      Physical Exam  CVP 17 General: Intubated on multiple drips.  HEENT: ETT Neck: Supple. JVP elevated to jaw.  Carotids 2+ bilat; no bruits. No lymphadenopathy or thyromegaly appreciated.RIJ  Cor: PMI nondisplaced. Tachy Irregular rate & rhythm. No rubs, gallops or murmurs.Sternal dressing.  Lungs: Diminished throughout.  Abdomen: Soft, nontender, nondistended. No hepatosplenomegaly. No bruits or masses. Good bowel sounds. Extremities: No cyanosis, clubbing, rash, edema. MAE x4 . RUE forearm dressing  Neuro: Awake on vent. Follows commands.  GU: Foley with minimal urine ouput.  Telemetry    Afib RVR 170s  EKG    N/A   Labs    CBC Recent Labs    07/07/21 1353 07/08/21 0115  07/09/21 2248 07/10/21 0026 07/10/21 0313 07/10/21 0409  WBC 9.6   < > 19.1*  --   --  16.3*  NEUTROABS 6.6  --   --   --   --   --   HGB 14.0   < > 9.3*   < > 8.5* 8.9*  HCT 40.6   < > 27.6*   < > 25.0* 25.8*  MCV 92.7   < > 93.6  --   --  92.5  PLT 205   < > 234  --   --  213   < > = values in this interval not displayed.   Basic Metabolic Panel Recent Labs    07/09/21 1625 07/09/21 2013 07/09/21 2020 07/09/21 2200 07/10/21 0313 07/10/21 0409  NA 135   < > 134*   < > 137 137  K 5.3*   < > 4.7   < > 4.1 4.0  CL 106  --  104  --   --  101  CO2 18*  --  20*  --   --  24  GLUCOSE 300*  --  331*  --   --  205*  BUN 32*  --  33*  --   --  37*  CREATININE 4.41*  --  4.70*  --   --  5.07*  CALCIUM 7.5*  --  7.0*  --   --  7.3*  MG 2.5*  --   --   --   --  2.3   < > = values in this interval not displayed.   Liver Function Tests Recent Labs    07/07/21 1353  AST 113*  ALT 24  ALKPHOS 38  BILITOT 1.0  PROT 6.1*  ALBUMIN 3.3*   No results for input(s): LIPASE, AMYLASE in the last 72 hours. Cardiac Enzymes No results for input(s): CKTOTAL, CKMB, CKMBINDEX, TROPONINI in the last 72 hours.  BNP: BNP (last 3 results) No results for input(s): BNP in the last 8760 hours.  ProBNP (last 3 results) No results for input(s): PROBNP in the last 8760 hours.   D-Dimer No results for input(s): DDIMER in the last 72 hours. Hemoglobin A1C Recent Labs    07/07/21 1353  HGBA1C 6.7*   Fasting Lipid Panel Recent Labs    07/07/21 1353  CHOL 208*  HDL 35*  LDLCALC 131*  TRIG 212*  CHOLHDL 5.9   Thyroid Function Tests No results for input(s): TSH, T4TOTAL, T3FREE, THYROIDAB in the last 72 hours.  Invalid input(s): FREET3  Other results:   Imaging    DG CHEST PORT 1 VIEW  Result Date: 07/10/2021 CLINICAL DATA:  Chest tube. EXAM: PORTABLE CHEST 1 VIEW COMPARISON:  July 10, 2021 FINDINGS: Multiple stable support lines are seen. A chest tube is noted with its  distal end overlying the lateral aspect of the left lung base. A nasogastric tube is seen with its distal tip approximately 6.3 cm distal to the expected region of the gastroesophageal junction. Multiple sternal wires are present. Mild, stable left basilar atelectasis is seen with a small, stable left pleural effusion. No pneumothorax is identified. The heart size and mediastinal contours are within normal limits. The visualized skeletal structures are unremarkable. IMPRESSION: 1. Stable support lines and tubes, as described above, with interval nasogastric tube placement and positioning. 2. Stable mild left basilar atelectasis with a small, stable left pleural effusion. Electronically Signed   By: Virgina Norfolk M.D.   On: 07/10/2021 02:23   Portable Chest x-ray  Result Date: 07/10/2021 CLINICAL DATA:  Line placement. EXAM: PORTABLE CHEST 1 VIEW COMPARISON:  July 09, 2021 FINDINGS: An endotracheal tube is seen with its distal tip approximately 4.8 cm from the carina. Stable right internal jugular venous catheter and Swan-Ganz catheter positioning is seen. Multiple sternal wires are present. Mild, stable atelectasis is seen within the left lung base. There is a small left pleural effusion. No pneumothorax is identified. The heart size and mediastinal contours are within normal limits. The visualized skeletal structures are unremarkable. IMPRESSION: 1. Interval endotracheal tube placement and positioning, as described above, with mild, stable left basilar atelectasis. 2. Small left pleural effusion. Electronically Signed   By: Virgina Norfolk M.D.   On: 07/10/2021 01:46     Medications:     Scheduled Medications:  acetaminophen  1,000 mg Oral Q6H   Or   acetaminophen (TYLENOL) oral liquid 160 mg/5 mL  1,000 mg Per Tube Q6H   amLODipine  2.5 mg Oral Daily   aspirin EC  325 mg Oral Daily   Or   aspirin  324 mg Per Tube Daily   atorvastatin  80 mg Oral Daily   bisacodyl  10 mg Oral Daily    Or   bisacodyl  10 mg Rectal Daily   chlorhexidine gluconate (MEDLINE KIT)  15 mL Mouth Rinse BID   Chlorhexidine Gluconate  Cloth  6 each Topical Daily   docusate  100 mg Per Tube BID   heparin injection (subcutaneous)  5,000 Units Subcutaneous Q8H   ketamine HCl       mouth rinse  15 mL Mouth Rinse 10 times per day   metoCLOPramide (REGLAN) injection  10 mg Intravenous Q6H   pantoprazole  40 mg Oral Daily   polyethylene glycol  17 g Per Tube Daily   sodium chloride flush  10-40 mL Intracatheter Q12H   sodium chloride flush  3 mL Intravenous Q12H   succinylcholine        Infusions:  sodium chloride     sodium chloride     sodium chloride     albumin human 12.5 g (07/08/21 1827)   And   sodium chloride Stopped (07/08/21 2051)   amiodarone 60 mg/hr (07/10/21 0352)    ceFAZolin (ANCEF) IV Stopped (07/10/21 0548)   fentaNYL infusion INTRAVENOUS 200 mcg/hr (07/10/21 0600)   furosemide     insulin 4.4 Units/hr (07/10/21 0600)   lactated ringers     lactated ringers 20 mL/hr at 07/10/21 0600   lactated ringers     norepinephrine (LEVOPHED) Adult infusion 16 mcg/min (07/10/21 0600)   promethazine (PHENERGAN) injection (IM or IVPB) Stopped (07/09/21 1628)    sodium bicarbonate (isotonic) infusion in sterile water 75 mL/hr at 07/10/21 0600   vasopressin 0.04 Units/min (07/10/21 0600)    PRN Medications: sodium chloride, albumin human **AND** sodium chloride, ALPRAZolam, dextrose, fentaNYL, lactated ringers, metoprolol tartrate, midazolam, midazolam, morphine injection, ondansetron (ZOFRAN) IV, oxyCODONE, promethazine (PHENERGAN) injection (IM or IVPB), sodium chloride flush, sodium chloride flush, traMADol    Patient Profile   Billy [name] is a 54 year with a history of visually impaired, DM, HTN, depression, anxiety takes 2 mg xanax 3 times a day, SVT, CKD Stage II, PAF, and CAD.Marland Kitchen   Anterior Stemi--> CABG--> cardiogenic shock.   Assessment/Plan   Anterior STEMI---> PCI ---> S/P  10/25 CABG x4 with radial harvest.--> cardiogenic shock   ECHO 06/25/2021 EF 50-55%. Intraoperative TEE 20% . Will need another ECHO once off pressors.  -ON norepi 17 mcg + vasopressin 0.04 units. Stop milrinone.  - Minimal urine output. Anticipate started CRRT today.  - Given IV lasix bolus with minimal urine output.. Plan for CRRT later today.  - Nephrology consulted. -Keep off bb - No ARB, MRA with creatinine > 5    2. Acute Hypoxic Respiratory Failure Required intubation 10/27 CCM managing.   3. PAF -S/P MAZE.  -Recurrent A fib over night. Amio increased to 60 mg per hour.  - Back in SR this morning but while rounding back in S fib RVR. Given 150 mg bolus now. Continue amio 60 mg daily. Briefly in NSR---> A fib RVR  150s. Given another bolus amio 150 mg bolus  now.   - Holding metoprolol as above.  - Hold off on anticoagulation. Discussed with Dr [name].    4. HTN  Now in shock.    4.  DMII  -Hgb 6.7  -On SSI    5. AKI on CKD Stage IIb.   - Followed in the community by Dr Moshe Cipro.  - Creatinine baseline 1.6-1.8 -Creatinine 1.9 on admit -->2.1>3.1-> 5  -? Contrast nephropathy  - Nephrology consulted. Placing HD catheter this morning.    6..H/O SVT ablation 25 year ago  7. Anemia  - Expected blood loss.  - Hgb 14--> blood loss 8.9  - CT output slowing    7.  Social  Visually impaired. Lives alone. He does not drive. If discharges to home he will need HF Paramedicine.    Length of Stay: 3  Amy Clegg, NP  07/10/2021, 7:28 AM  Advanced Heart Failure Team Pager [phone] (M-F; 7a - 5p)  Please contact [facility] for night-coverage after hours (5p -7a ) and weekends on [email or web address]  Patient seen with NP, agree with the above note.   Worsening hypoxemic respiratory failure overnight.  He was intubated. He has been in and out of atrial fibrillation with RVR, currently in AF with HR 140s. BP is stable.   This morning he is on milrinone 0.25, NE 16,  vasopressin 0.04.  Started on amiodarone 60 mg/hr.   Oliguric, not responsive to Lasix bolus last night. CVP 17.   He is currently awake on vent.   General: NAD Neck: JVP 16 cm, no thyromegaly or thyroid nodule.  Lungs: Clear to auscultation bilaterally with normal respiratory effort. CV: Nondisplaced PMI.  Heart tachy, irregular S1/S2, no S3/S4, no murmur.  1+ ankle edema.  Abdomen: Soft, nontender, no hepatosplenomegaly, no distention.  Skin: Intact without lesions or rashes.  Neurologic: Awake on vent. Extremities: No clubbing or cyanosis.  HEENT: Normal.   1. CAD: Admitted with anterolateral MI from occluded diagonal treated with DES.  Patient then had CABG with LIMA-LAD, SVG-D, SVG-PDA, and left radial-OM for complete revascularization.    - Continue ASA 81. Will likely drop this at discharge given need for Plavix and anticoagulation.  - Plan to start Plavix 75 today - Will eventually need to be anticoagulated for atrial fibrillation.  - atorvastatin 80 mg daily.  - He is on amlodipine 2.5 for radial harvest, will hold today with increasing pressor support.   2. Atrial fibrillation: Paroxysmal, s/p Maze and appendage clipping.  He has gone into AF with RVR overnight. Maintaining BP currently.  - Amiodarone gtt 60 mg/hr, bolus amiodarone. - If he does not convert with amiodarone and remains in RVR or becomes unstable, will need DCCV.  - Will need to eventually start anticoagulation, surgery recommends holding off on systemic heparin today.  3. AKI on CKD stage 3: Baseline creatinine around 1.6, now up to 5 post-op.  He has likely had multiple hits including contrast with initial cath/PCI and possible peri-operative hypotension.  Suspect ATN is present, will need to support him for now and hopefully function will eventually improve.  Now significantly volume overloaded.  - Will start CVVH today.  4. Acute systolic CHF: Ischemic cardiomyopathy.  Echo prior to this admission with EF  50-55% (pre-MI and CABG).  Intraoperative TEE with EF 20%.  CVP 17 today with creatinine 5.  Minimal response to IV Lasix. On milrinone 0.25 + NE 16 + vasopressin 0.03.  - He will need CVVH for volume management, start today.  - Stop milrinone, continue NE and vasopressin as needed.     - Limited echo to rule out pericardial effusion.  5. Anemia: Post-op.   6. ID: Afebrile today but WBCs 16, SVR low by Flotrack.  Not currently on antibiotics. 7. Acute hypoxemic respiratory failure: Suspect pulmonary edema, now intubated.  - Per CCM.  - Fluid removal via CVVH.   CRITICAL CARE Performed by: Loralie Champagne  Total critical care time: 40 minutes  Critical care time was exclusive of separately billable procedures and treating other patients.  Critical care was necessary to treat or prevent imminent or life-threatening deterioration.  Critical care was time spent personally by me on the  following activities: development of treatment plan with patient and/or surrogate as well as nursing, discussions with consultants, evaluation of patient's response to treatment, examination of patient, obtaining history from patient or surrogate, ordering and performing treatments and interventions, ordering and review of laboratory studies, ordering and review of radiographic studies, pulse oximetry and re-evaluation of patient's condition.  Loralie Champagne 07/10/2021 8:25 AM

## 2021-07-10 NOTE — Progress Notes (Signed)
  Echocardiogram 2D Echocardiogram has been performed.  Billy [name] 07/10/2021, 9:40 AM

## 2021-07-10 NOTE — Progress Notes (Signed)
Patient was requiring increase in quad strength levophed and increase in oxygenation at beginning of shift (1900 on 10/26). Patient was on 15 L HFNC. Nurse notified Dr. Cyndia Bent who ordered ABG which read pH 7.24, CO2 39, HCO3 16.6, BE/D -10, PO2 62. Dr. Cyndia Bent then ordered BiPAP and patient requiring 100% FiO2 to maintain O2 sat >92%. Patient continued to become more acidotic and hypoxic despite 7 amps of 50 mEQ sodium bicarbonate, still requiring increase in pressor requirements as well as going in and out of Afib RVR. Vasopressin was started per Dr. Cyndia Bent and CCM was consulted. After ABG ordered by Dr. Cyndia Bent at [facility] resulted (pH 7.26, CO2 54, HCO3 24, PO2 65), Dr. Elsworth Soho decided to proceed with intubation. Nurse gave 200 mcg fentanyl, 2 mg versed, 100 mg rocuronium, 30 mg etomidate, and started fentanyl IV infusion per Dr. Elsworth Soho throughout intubation procedure. Also, 50 mcg fentanyl bolus via IV infusion was given per Dr. Elsworth Soho. Patient tolerated intubation procedure well. Patient resting now.

## 2021-07-11 ENCOUNTER — Inpatient Hospital Stay (HOSPITAL_COMMUNITY): Payer: Medicare HMO

## 2021-07-11 DIAGNOSIS — J810 Acute pulmonary edema: Secondary | ICD-10-CM | POA: Diagnosis not present

## 2021-07-11 DIAGNOSIS — Z9911 Dependence on respirator [ventilator] status: Secondary | ICD-10-CM | POA: Diagnosis not present

## 2021-07-11 DIAGNOSIS — R009 Unspecified abnormalities of heart beat: Secondary | ICD-10-CM | POA: Diagnosis not present

## 2021-07-11 DIAGNOSIS — N179 Acute kidney failure, unspecified: Secondary | ICD-10-CM | POA: Diagnosis not present

## 2021-07-11 DIAGNOSIS — R570 Cardiogenic shock: Secondary | ICD-10-CM | POA: Diagnosis not present

## 2021-07-11 LAB — COOXEMETRY PANEL
Carboxyhemoglobin: 0.9 % (ref 0.5–1.5)
Carboxyhemoglobin: 0.7 % (ref 0.5–1.5)
Carboxyhemoglobin: 0.7 % (ref 0.5–1.5)
Carboxyhemoglobin: 0.9 % (ref 0.5–1.5)
Methemoglobin: 1.1 % (ref 0.0–1.5)
Methemoglobin: 0.8 % (ref 0.0–1.5)
Methemoglobin: 0.8 % (ref 0.0–1.5)
Methemoglobin: 0.8 % (ref 0.0–1.5)
O2 Saturation: 52.2 %
O2 Saturation: 98.9 %
O2 Saturation: 56.8 %
O2 Saturation: 98.8 %
Total hemoglobin: 11.5 g/dL — ABNORMAL LOW (ref 12.0–16.0)
Total hemoglobin: 12 g/dL (ref 12.0–16.0)
Total hemoglobin: 13.5 g/dL (ref 12.0–16.0)
Total hemoglobin: 13.6 g/dL (ref 12.0–16.0)

## 2021-07-11 LAB — RENAL FUNCTION PANEL
Albumin: 2.2 g/dL — ABNORMAL LOW (ref 3.5–5.0)
Albumin: 2 g/dL — ABNORMAL LOW (ref 3.5–5.0)
Anion gap: 9 (ref 5–15)
Anion gap: 9 (ref 5–15)
BUN: 29 mg/dL — ABNORMAL HIGH (ref 6–20)
BUN: 30 mg/dL — ABNORMAL HIGH (ref 6–20)
CO2: 25 mmol/L (ref 22–32)
CO2: 24 mmol/L (ref 22–32)
Calcium: 7.5 mg/dL — ABNORMAL LOW (ref 8.9–10.3)
Calcium: 7.2 mg/dL — ABNORMAL LOW (ref 8.9–10.3)
Chloride: 100 mmol/L (ref 98–111)
Chloride: 98 mmol/L (ref 98–111)
Creatinine, Ser: 4.43 mg/dL — ABNORMAL HIGH (ref 0.61–1.24)
Creatinine, Ser: 4.67 mg/dL — ABNORMAL HIGH (ref 0.61–1.24)
GFR, Estimated: 15 mL/min — ABNORMAL LOW (ref >60)
GFR, Estimated: 14 mL/min — ABNORMAL LOW (ref >60)
Glucose, Bld: 227 mg/dL — ABNORMAL HIGH (ref 70–99)
Glucose, Bld: 239 mg/dL — ABNORMAL HIGH (ref 70–99)
Phosphorus: 4.2 mg/dL (ref 2.5–4.6)
Phosphorus: 4.2 mg/dL (ref 2.5–4.6)
Potassium: 4.8 mmol/L (ref 3.5–5.1)
Potassium: 4.7 mmol/L (ref 3.5–5.1)
Sodium: 134 mmol/L — ABNORMAL LOW (ref 135–145)
Sodium: 131 mmol/L — ABNORMAL LOW (ref 135–145)

## 2021-07-11 LAB — CBC WITH DIFFERENTIAL/PLATELET
Abs Immature Granulocytes: 0.08 10*3/uL — ABNORMAL HIGH (ref 0.00–0.07)
Basophils Absolute: 0 10*3/uL (ref 0.0–0.1)
Basophils Relative: 0 %
Eosinophils Absolute: 0 10*3/uL (ref 0.0–0.5)
Eosinophils Relative: 0 %
HCT: 24.7 % — ABNORMAL LOW (ref 39.0–52.0)
Hemoglobin: 8.4 g/dL — ABNORMAL LOW (ref 13.0–17.0)
Immature Granulocytes: 1 %
Lymphocytes Relative: 11 %
Lymphs Abs: 1.4 10*3/uL (ref 0.7–4.0)
MCH: 32.1 pg (ref 26.0–34.0)
MCHC: 34 g/dL (ref 30.0–36.0)
MCV: 94.3 fL (ref 80.0–100.0)
Monocytes Absolute: 1.1 10*3/uL — ABNORMAL HIGH (ref 0.1–1.0)
Monocytes Relative: 9 %
Neutro Abs: 10.3 10*3/uL — ABNORMAL HIGH (ref 1.7–7.7)
Neutrophils Relative %: 79 %
Platelets: 148 10*3/uL — ABNORMAL LOW (ref 150–400)
RBC: 2.62 MIL/uL — ABNORMAL LOW (ref 4.22–5.81)
RDW: 13 % (ref 11.5–15.5)
WBC: 13 10*3/uL — ABNORMAL HIGH (ref 4.0–10.5)
nRBC: 0 % (ref 0.0–0.2)

## 2021-07-11 LAB — GLUCOSE, CAPILLARY
Glucose-Capillary: 101 mg/dL — ABNORMAL HIGH (ref 70–99)
Glucose-Capillary: 122 mg/dL — ABNORMAL HIGH (ref 70–99)
Glucose-Capillary: 122 mg/dL — ABNORMAL HIGH (ref 70–99)
Glucose-Capillary: 131 mg/dL — ABNORMAL HIGH (ref 70–99)
Glucose-Capillary: 136 mg/dL — ABNORMAL HIGH (ref 70–99)
Glucose-Capillary: 144 mg/dL — ABNORMAL HIGH (ref 70–99)
Glucose-Capillary: 147 mg/dL — ABNORMAL HIGH (ref 70–99)
Glucose-Capillary: 148 mg/dL — ABNORMAL HIGH (ref 70–99)
Glucose-Capillary: 149 mg/dL — ABNORMAL HIGH (ref 70–99)
Glucose-Capillary: 151 mg/dL — ABNORMAL HIGH (ref 70–99)
Glucose-Capillary: 155 mg/dL — ABNORMAL HIGH (ref 70–99)
Glucose-Capillary: 156 mg/dL — ABNORMAL HIGH (ref 70–99)
Glucose-Capillary: 156 mg/dL — ABNORMAL HIGH (ref 70–99)
Glucose-Capillary: 171 mg/dL — ABNORMAL HIGH (ref 70–99)
Glucose-Capillary: 226 mg/dL — ABNORMAL HIGH (ref 70–99)
Glucose-Capillary: 233 mg/dL — ABNORMAL HIGH (ref 70–99)
Glucose-Capillary: 238 mg/dL — ABNORMAL HIGH (ref 70–99)

## 2021-07-11 LAB — MAGNESIUM
Magnesium: 2.5 mg/dL — ABNORMAL HIGH (ref 1.7–2.4)
Magnesium: 2.4 mg/dL (ref 1.7–2.4)

## 2021-07-11 LAB — POCT I-STAT 7, (LYTES, BLD GAS, ICA,H+H)
Acid-Base Excess: 2 mmol/L (ref 0.0–2.0)
Bicarbonate: 26.1 mmol/L (ref 20.0–28.0)
Calcium, Ion: 1.1 mmol/L — ABNORMAL LOW (ref 1.15–1.40)
HCT: 25 % — ABNORMAL LOW (ref 39.0–52.0)
Hemoglobin: 8.5 g/dL — ABNORMAL LOW (ref 13.0–17.0)
O2 Saturation: 98 %
Patient temperature: 98.1
Potassium: 4.4 mmol/L (ref 3.5–5.1)
Sodium: 136 mmol/L (ref 135–145)
TCO2: 27 mmol/L (ref 22–32)
pCO2 arterial: 39.5 mmHg (ref 32.0–48.0)
pH, Arterial: 7.427 (ref 7.350–7.450)
pO2, Arterial: 111 mmHg — ABNORMAL HIGH (ref 83.0–108.0)

## 2021-07-11 LAB — ECHOCARDIOGRAM LIMITED
Calc EF: 38.7 %
Height: 74 in
Single Plane A2C EF: 38.9 %
Single Plane A4C EF: 48.5 %
Weight: 4278.69 oz

## 2021-07-11 LAB — HEPARIN LEVEL (UNFRACTIONATED): Heparin Unfractionated: 0.13 IU/mL — ABNORMAL LOW (ref 0.30–0.70)

## 2021-07-11 MED ORDER — INSULIN ASPART 100 UNIT/ML IJ SOLN: 0.0000 [IU] | INTRAMUSCULAR | Status: DC

## 2021-07-11 MED ORDER — AMIODARONE IV BOLUS ONLY 150 MG/100ML: 150.0000 mg | Freq: Once | INTRAVENOUS | Status: DC

## 2021-07-11 MED ORDER — VASOPRESSIN 20 UNITS/100 ML INFUSION FOR SHOCK: 0.0000 [IU]/min | INTRAVENOUS | Status: DC

## 2021-07-11 MED ORDER — INSULIN ASPART 100 UNIT/ML IJ SOLN
2.0000 [IU] | INTRAMUSCULAR | Status: DC
  Administered 2021-07-11 (×2): 2 [IU] via SUBCUTANEOUS
  Administered 2021-07-11 (×2): 6 [IU] via SUBCUTANEOUS
  Administered 2021-07-12: 2 [IU] via SUBCUTANEOUS
  Administered 2021-07-12: 4 [IU] via SUBCUTANEOUS

## 2021-07-11 MED ORDER — MIDAZOLAM BOLUS VIA INFUSION
0.0000 mg | INTRAVENOUS | Status: DC | PRN
Start: 2021-07-11 — End: 2021-07-13
  Administered 2021-07-12 (×2): 0.5 mg via INTRAVENOUS
  Administered 2021-07-12: 1 mg via INTRAVENOUS
  Administered 2021-07-12 (×2): 0.5 mg via INTRAVENOUS
  Filled 2021-07-11: qty 5

## 2021-07-11 MED ORDER — CLONAZEPAM 1 MG PO TABS
2.0000 mg | ORAL_TABLET | Freq: Two times a day (BID) | ORAL | Status: DC
  Administered 2021-07-11 – 2021-07-13 (×6): 2 mg
  Filled 2021-07-11 (×6): qty 2

## 2021-07-11 MED ORDER — POLYETHYLENE GLYCOL 3350 17 G PO PACK
17.0000 g | PACK | Freq: Two times a day (BID) | ORAL | Status: DC
  Administered 2021-07-11 – 2021-07-13 (×6): 17 g
  Filled 2021-07-11 (×6): qty 1

## 2021-07-11 MED ORDER — MIDAZOLAM-SODIUM CHLORIDE 100-0.9 MG/100ML-% IV SOLN
0.0000 mg/h | INTRAVENOUS | Status: DC
  Administered 2021-07-11: 5 mg/h via INTRAVENOUS
  Administered 2021-07-11: 2 mg/h via INTRAVENOUS
  Administered 2021-07-12: 8 mg/h via INTRAVENOUS
  Administered 2021-07-12 – 2021-07-13 (×2): 10 mg/h via INTRAVENOUS
  Filled 2021-07-11 (×6): qty 100

## 2021-07-11 MED ORDER — PERFLUTREN LIPID MICROSPHERE
1.0000 mL | INTRAVENOUS | Status: AC | PRN
  Administered 2021-07-11: 2 mL via INTRAVENOUS
  Filled 2021-07-11: qty 10

## 2021-07-11 MED ORDER — INSULIN DETEMIR 100 UNIT/ML ~~LOC~~ SOLN
18.0000 [IU] | Freq: Two times a day (BID) | SUBCUTANEOUS | Status: DC
  Administered 2021-07-11 (×2): 18 [IU] via SUBCUTANEOUS
  Filled 2021-07-11 (×5): qty 0.18

## 2021-07-11 MED ORDER — CALCIUM GLUCONATE-NACL 2-0.675 GM/100ML-% IV SOLN
2.0000 g | Freq: Once | INTRAVENOUS | Status: AC
  Administered 2021-07-11: 2000 mg via INTRAVENOUS
  Filled 2021-07-11: qty 100

## 2021-07-11 MED ORDER — HEPARIN (PORCINE) 25000 UT/250ML-% IV SOLN
1700.0000 [IU]/h | INTRAVENOUS | Status: DC
  Administered 2021-07-11: 1000 [IU]/h via INTRAVENOUS
  Administered 2021-07-12: 1500 [IU]/h via INTRAVENOUS
  Administered 2021-07-12: 1200 [IU]/h via INTRAVENOUS
  Filled 2021-07-11 (×3): qty 250

## 2021-07-11 MED ORDER — AMIODARONE LOAD VIA INFUSION
150.0000 mg | Freq: Once | INTRAVENOUS | Status: AC
  Administered 2021-07-11: 150 mg via INTRAVENOUS
  Filled 2021-07-11: qty 83.34

## 2021-07-11 MED ORDER — DEXTROSE 10 % IV SOLN: INTRAVENOUS | Status: DC | PRN

## 2021-07-11 NOTE — Progress Notes (Signed)
      [address]       [place] [address]             [phone]        Patient developed agitation during chest PT.  Nurse states she found patient with all extremities shaking with him trying to get up.  His dialysis catheter came out.  Direct pressure was held.  Will add safety restraints.  Ellwood Handler, PA-C

## 2021-07-11 NOTE — Procedures (Signed)
Central Venous Catheter Insertion Procedure Note  Billy [name]  [id number]  [birth year]  Date:07/11/21  Time:1:46 PM   Provider Performing:Cleveland Yarbro Cipriano Mile   Procedure: Insertion of Non-tunneled Central Venous [id number]) with US guidance (74827)   Indication(s) Hemodialysis  Consent Risks of the procedure as well as the alternatives and risks of each were explained to the patient and/or caregiver.  Consent for the procedure was obtained and is signed in the bedside chart  Anesthesia Topical only with 1% lidocaine   Timeout Verified patient identification, verified procedure, site/side was marked, verified correct patient position, special equipment/implants available, medications/allergies/relevant history reviewed, required imaging and test results available.  Sterile Technique Maximal sterile technique including full sterile barrier drape, hand hygiene, sterile gown, sterile gloves, mask, hair covering, sterile ultrasound probe cover (if used).  Procedure Description Area of catheter insertion was cleaned with chlorhexidine and draped in sterile fashion.  With real-time ultrasound guidance a HD catheter was placed into the left internal jugular vein. Nonpulsatile blood flow and easy flushing noted in all ports.  The catheter was sutured in place and sterile dressing applied.  Complications/Tolerance None; patient tolerated the procedure well. Chest X-ray is ordered to verify placement for internal jugular or subclavian cannulation.   Chest x-ray is not ordered for femoral cannulation.  EBL Minimal  Specimen(s) None

## 2021-07-11 NOTE — Progress Notes (Signed)
[facility] Progress Note Patient Name: Billy [name] DOB: 03/07/1966 MRN: [id number]   Date of Service  07/11/2021  HPI/Events of Note  Ionized calcium 1.10, PO4 4.2.  eICU Interventions  Calcium gluconate 2 gm iv x 1 ordered.        Kerry Kass Joany Khatib 07/11/2021, 9:19 PM

## 2021-07-11 NOTE — Progress Notes (Signed)
Patient became very agitated and began flailing all extremities and lifting his core; moving toward the foot of the bed after give the pt a bolus of fentanyl to calm down after chest physiotherapy. In the flailing and moving toward the foot of the bed the HD catheter was ripped from his neck.   Consuella Lose, RN

## 2021-07-11 NOTE — Discharge Summary (Signed)
[address]       [place] [address]             [phone]    Physician Discharge Summary  Patient ID: Billy [name] MRN: [id number] DOB/AGE: 1965-10-18 55 y.o.  Admit date: 07/07/2021 Discharge date: 08/06/2021  Admission Diagnoses:  Patient Active Problem List   Diagnosis Date Noted   STEMI (ST elevation myocardial infarction) ([facility]) 07/07/2021   Exertional dyspnea 06/30/2021   SVT (supraventricular tachycardia) ([place]) 06/30/2021   Chest pain 06/25/2021   Renal insufficiency 06/25/2021   Hypertensive urgency 06/25/2021   S/P amputation of lesser toe, right ([facility]) 07/13/2017   Chronic osteomyelitis of toe, right ([place])    Anxiety and depression 06/30/2017   Left shoulder pain 06/30/2017   Marijuana dependence ([facility]) 06/30/2017   Diabetes mellitus with ulcer of toe ([place]) 06/05/2012    Class: Acute   Hypertension 06/05/2012    Class: Chronic   Discharge Diagnoses:  Patient Active Problem List   Diagnosis Date Noted   Pressure injury of skin 07/18/2021   Cardiogenic shock ([facility])    AKI (acute kidney injury) ([facility])    Acute respiratory failure with hypoxia ([facility])    S/P CABG x 4 07/08/2021   STEMI (ST elevation myocardial infarction) ([facility]) 07/07/2021   Exertional dyspnea 06/30/2021   SVT (supraventricular tachycardia) ([facility]) 06/30/2021   Chest pain 06/25/2021   Renal insufficiency 06/25/2021   Hypertensive urgency 06/25/2021   S/P amputation of lesser toe, right ([facility]) 07/13/2017   Chronic osteomyelitis of toe, right ([place])    Anxiety and depression 06/30/2017   Left shoulder pain 06/30/2017   Marijuana dependence ([place]) 06/30/2017   Diabetes mellitus with ulcer of toe ([facility]) 06/05/2012    Class: Acute   Hypertension 06/05/2012    Class: Chronic   Discharged Condition: stable  History of the present illness:  The patient is a 55 year old gentleman with a history of paroxysmal atrial fibrillation.  Additionally he is noted to have poorly  treated hypertension.  The patient was recently referred to cardiology for a stress test due to exertional dyspnea.  During the study he developed chest discomfort and was converted from a exercise to [facility] stress test.  He then was noted to have ST elevation on monitoring and subsequent to that EMS was called.  The patient then went into atrial fibrillation with worsening of his lateral injury current.  The patient refused coronary stenting and additional cardiac medications and even stated he was "ready to die".  He was sent to the ER with some improvement but continued to be diaphoretic and had 4/10 ongoing chest discomfort.  The symptoms were ongoing since the time of the stress test.  Full discussion of the patient was undertaken regarding PCI and catheterization being standard of care for acute myocardial infarction.  Patient declined stenting but said he would be willing to have heart catheterization and even angioplasty.  A left heart catheterization and a PCI to the diagonal were done but he was noted to have severe three-vessel disease and cardiothoracic surgical consultation was obtained with Dr. [name].  Dr. [name] recommended proceeding with CABG as his best revascularization option due to the severity of the anatomical findings and the STEMI event.  Hospital course:  The patient was medically stabilized and proceeded to the operating room on 07/09/19 22 at which time he underwent CABG x4.  He additionally underwent a maze procedure on the left side with box lesion set.  Also, and a LAA atrial clip was placed.  He tolerated the procedure well was taken to the surgical intensive care unit in stable condition.  Postoperative hospital course The patient has had a complex postoperative course.  His intraoperative ejection fraction was noted to be 20%.  The advanced heart failure team was brought in to assist with management.  Initially he was intubated and hypotensive necessitating placement  on norepinephrine and vasopressin.  He also went into a recurrent atrial fibrillation was started on intravenous amiodarone.  On 1129 his car oximetry were noted to be low and dobutamine was added.  On 07/15/2021 he was able to be extubated and went into recurrent atrial fibrillation with his amiodarone drip being reinitiated.  On 11 3 his dobutamine was discontinued.  He has had a postoperative acute on chronic renal failure and nephrology was brought in to assist with management.  On 07/20/2021 he was started on intermittent hemodialysis and a right internal jugular tunneled HD catheter was placed on 07/22/2021.  He has a expected acute blood loss anemia with chronic renal insufficiency he has been managed by the nephrology service with iron as well as Aranesp.  He has also required postoperative packed cell transfusion.  His hematocrit is stabilized at approximately 24% over several days.  He has felt to require both Plavix and anticoagulation therapy with the Plavix planned for 6 months post PCI.  Aspirin has been discontinued.  His amiodarone is being converted to p.o. when he has maintained sinus rhythm for several days.  As noted he is also on Eliquis.  His renal function has shown a steady improvement and hopes are that he will not require further dialysis.  This is being monitored closely by the nephrology service.  He is felt to be very stable from a heart failure perspective on hydralazine, Imdur and Coreg.  He is not felt to be a candidate for ARN/SGLT2/spironolactone due to his renal insufficiency and GDMT will require further initiation as his condition stabilizes.  His most recent echocardiogram done on 07/29/2021 showed his ejection fraction to be 40 to 45%.  In terms of his pulmonary status following extubation he was treated for a suspected pneumonia with cefepime.  His course has been completed.  He is also noted to have had a thrombocytopenia which is resolved.  He did have an episode of right-sided  weakness during the early hospitalization head CT was normal on 11/3.  He did have some urinary retention requiring Foley placement and this has subsequently been discontinued over time.  He is on Flomax as well.  In terms of disposition is felt that he will require short-term skilled nursing facility as he does not qualify for CIR and lives alone.  This was not approved by his insurance company and therefore he has been monitoring for clinical stability for discharge with home health nursing and physical therapy arrangements.  The patient's urinary output continued to improved.  His creatinine and BUN improved.  It was felt he would no longer require dialysis and his tunneled HD catheter was removed on 07/31/2021. He had dizziness and was found to be orthostatic.  His blood glucose levels remain elevated and his Levemir dosing was increased.  Plan is to resume him on his home diabetic medication regimen at discharge and he will need close follow-up with his primary care.  His orthostasis persisted and his Imdur dose was decreased.  This is continued overall to show a steady progress and symptoms of dizziness have significantly  improved.  His Amiodarone was also discontinued as he was maintaining NSR and had completed a several week course of medication.  His incisions are noted to be healing well without evidence of infection.  He is tolerating routine activity using standard protocols.  His laboratory values are all felt to be stable and of note his creatinine on 08/06/2021 is 2.2.  This is near preoperative baseline.  Overall his status is felt to be quite stable for discharge on 08/06/2001 with home health arrangements in place.  Consults: cardiology, pulmonary/intensive care, and hematology/oncology  Significant Diagnostic Studies: angiography:     1st Diag-2 lesion is 100% stenosed.   1st Diag-1 lesion is 90% stenosed.   Prox LAD lesion is 65% stenosed.   Mid LAD lesion is 50% stenosed.   Dist RCA  lesion is 80% stenosed.   Dist Cx lesion is 80% stenosed.   A drug-eluting stent was successfully placed using a STENT ONYX FRONTIER 2.5X15.   Post intervention, there is a 0% residual stenosis.   LV end diastolic pressure is normal.    Acute occluded first diagonal treated with one drug-eluting stent; the patient will be maintained on Aggrastat until a revascularization strategy is devised for residual multivessel disease with long segment LAD involvement.   LVEDP 22mmHg.   The results were discussed with Dr. Burt Knack.  Treatments:  CABG X 4.  LIMA to LAD, reverse saphenous vein graft to diagonal, reverse saphenous vein graft to PDA, left radial artery to obtuse marginal Endoscopic greater saphenous vein harvest on the right Open harvest of the left radial artery Left sided maze procedure with box lesion set 40 mm atrial clip placement by Dr. [name] on 07/08/2021.   Discharge Exam:   Discharge Medications:  The patient has been discharged on:  General appearance: alert, cooperative, and no distress Heart: regular rate and rhythm Lungs: clear to auscultation bilaterally Abdomen: benign Extremities: min edema Wound: incis well healed 1.Beta Blocker:  Yes [ X  ]                              No   [   ]                              If No, reason:  2.Ace Inhibitor/ARB: Yes [   ]                                     No  [ X   ]                                     If No, reason: AKI  3.Statin:   Yes [ X  ]                  No  [   ]                  If No, reason:  4.Ecasa:  Yes  [   ]                  No   [ X  ]  If No, reason: on Plavix and Eliquis  Patient had ACS upon admission:  Plavix/P2Y12 inhibitor: Yes [ X  ]                                      No  [   ]     Discharge Instructions     AMB Referral to [facility]   Complete by: As directed    Please refer to RN case manager for post hospital follow up.  Netta Cedars, MSN,  RN [facility] Phone [phone]  Toll free office [phone]   Reason for Referral: [facility] Disease Management ([employer] payers)   Disease managment services needed: Nurse Case Manager   Diagnoses of:  Other Hypertension     Other Diagnosis: S/P CABG   Expected date of contact: Emergent - 3 Days   Amb Referral to Cardiac Rehabilitation   Complete by: As directed    Diagnosis:  CABG STEMI PTCA     CABG X ___: 4   After initial evaluation and assessments completed: Virtual Based Care may be provided alone or in conjunction with Phase 2 Cardiac Rehab based on patient barriers.: Yes   Discharge patient   Complete by: As directed    Discharge disposition: 01-Home or Self Care   Discharge patient date: 08/06/2021      Allergies as of 08/06/2021   No Known Allergies      Medication List     STOP taking these medications    amLODipine 5 MG tablet Commonly known as: NORVASC   diclofenac Sodium 1 % Gel Commonly known as: VOLTAREN   gabapentin 100 MG capsule Commonly known as: NEURONTIN   methocarbamol 500 MG tablet Commonly known as: ROBAXIN   ramipril 10 MG capsule Commonly known as: ALTACE   Vascepa 1 g capsule Generic drug: icosapent Ethyl       TAKE these medications    acetaminophen 325 MG tablet Commonly known as: TYLENOL Take 2 tablets (650 mg total) by mouth every 6 (six) hours as needed for fever, headache or mild pain.   alprazolam 2 MG tablet Commonly known as: XANAX Take 1 tablet (2 mg total) by mouth 3 (three) times daily as needed for anxiety.   apixaban 5 MG Tabs tablet Commonly known as: ELIQUIS Take 1 tablet (5 mg total) by mouth 2 (two) times daily.   atorvastatin 80 MG tablet Commonly known as: LIPITOR Take 1 tablet (80 mg total) by mouth daily.   B-D ULTRAFINE III SHORT PEN 31G X 8 MM Misc Generic drug: Insulin Pen Needle USE TO ADMINISTER INSULIN DAILY E11..39   carvedilol 6.25 MG  tablet Commonly known as: COREG Take 1 tablet (6.25 mg total) by mouth 2 (two) times daily with a meal.   clopidogrel 75 MG tablet Commonly known as: PLAVIX Take 1 tablet (75 mg total) by mouth daily.   freestyle lancets CHECK BLOOD SUGAR 3 TIMES DAILY   hydrALAZINE 25 MG tablet Commonly known as: APRESOLINE Take 0.5 tablets (12.5 mg total) by mouth 3 (three) times daily.   isosorbide mononitrate 30 MG 24 hr tablet Commonly known as: IMDUR Take 0.5 tablets (15 mg total) by mouth daily.   linagliptin 5 MG Tabs tablet Commonly known as: TRADJENTA Take 5 mg by mouth daily. Reported on 09/09/2015   MAGNESIUM CITRATE PO Take 1 scoop by mouth daily as needed.  meclizine 12.5 MG tablet Commonly known as: ANTIVERT Take 1 tablet (12.5 mg total) by mouth 2 (two) times daily as needed for dizziness.   multivitamin with minerals Tabs tablet Take 1 tablet by mouth daily.   oxyCODONE 5 MG immediate release tablet Commonly known as: Oxy IR/ROXICODONE Take 1 tablet (5 mg total) by mouth every 12 (twelve) hours as needed for up to 7 days for severe pain.   sertraline 50 MG tablet Commonly known as: ZOLOFT Take 1 tablet (50 mg total) by mouth daily.   tamsulosin 0.4 MG Caps capsule Commonly known as: FLOMAX Take 1 capsule (0.4 mg total) by mouth at bedtime.   traZODone 100 MG tablet Commonly known as: DESYREL Take 100 mg by mouth at bedtime. Per pt - takes 75 mg (splits tablet in half; and then quarters - then takes 3/4 of tablet nightly)   Tyler Aas FlexTouch 100 UNIT/ML FlexTouch Pen Generic drug: insulin degludec Inject 0-30 Units into the skin. Inject 10-30 units subQ PRN. Checks CBG with Libre. Gives insulin if "sugar is high"               Durable Medical Equipment  (From admission, onward)           Start     Ordered   08/05/21 1514  For home use only DME 4 wheeled rolling walker with seat  Once       Question:  Patient needs a walker to treat with the  following condition  Answer:  S/P CABG x 4   08/05/21 1513            Contact information for follow-up providers     Lajuana Matte, MD Follow up on 08/15/2021.   Specialty: Cardiothoracic Surgery Why: Appointment is via TELEPHONE. Please do NOT go to the office. Dr. [name] will call at 3:00pm Contact information: [address] [place] [address] [phone]         Loel Dubonnet, NP. Go on 08/12/2021.   Specialty: Cardiology Why: Appointment time is at 2:45 pm Contact information: [place] [address] [phone]         [facility] Follow up.   Specialty: Cardiology Why: 08/27/21 at 1:30 PM  [facility], [place] [address] Contact information: [address] [id number] [place] [address] [phone]        [facility] Follow up.   Specialty: Home Health Services Why: Home Health RN and Physical Therapy-agency will call and arrange visits Contact information: [place] [address] [phone]              Contact information for after-discharge care     Destination     [facility] .   Service: Skilled Nursing Contact information: [address] [place] [phone]                     Signed: Ifeanyi Giovanni, PA-C  08/06/2021, 1:13 PM

## 2021-07-11 NOTE — Progress Notes (Signed)
Discontinued CPT session. Pt got aggravated trying to pull on ETT. RT will continue to monitor.

## 2021-07-11 NOTE — Progress Notes (Addendum)
  Echocardiogram 2D Echocardiogram  with contrast has been performed.  Merrie Roof F 07/11/2021, 5:49 PM

## 2021-07-11 NOTE — Progress Notes (Signed)
[name] Progress Note   Subjective:   initiated CRRT yesterday - I/Os 3.7 / 4.0, UOP 135m UF with CRRT 3.8L.  Agitated with chest PT - HD catheter pulled out.  Remains on vasopressor support.   Objective Vitals:   07/11/21 0900 07/11/21 0915 07/11/21 0930 07/11/21 0945  BP:      Pulse: (!) 59 (!) 43 91 88  Resp: (!) 0 (!) 0 20 (!) 0  Temp: (!) 97.5 F (36.4 C) 97.7 F (36.5 C) 97.9 F (36.6 C) 97.9 F (36.6 C)  TempSrc:      SpO2: 100% 100% 100% 100%  Weight:      Height:       Physical Exam General: intubated, sedated Heart: RRR currently on monitor, no rub Lungs: coarse on vent Abdomen: soft, nontender Extremities: diffuse edema Dialysis Access: RIJ temp HD cath out, no bleeding at site.  Additional Objective Labs: Basic Metabolic Panel: Recent Labs  Lab 07/10/21 0409 07/10/21 0937 07/10/21 1555 07/11/21 0403  NA 137  --  137 134*  K 4.0  --  4.2 4.8  CL 101  --  100 100  CO2 24  --  25 25  GLUCOSE 205*  --  152* 227*  BUN 37*  --  37* 29*  CREATININE 5.07*  --  5.52* 4.43*  CALCIUM 7.3*  --  7.7* 7.5*  PHOS  --  5.9* 5.0* 4.2   Liver Function Tests: Recent Labs  Lab 07/07/21 1353 07/10/21 0937 07/10/21 1555 07/11/21 0403  AST 113* 29  --   --   ALT 24 5  --   --   ALKPHOS 38 40  --   --   BILITOT 1.0 0.6  --   --   PROT 6.1* 5.2*  --   --   ALBUMIN 3.3* 2.3* 2.3* 2.2*   No results for input(s): LIPASE, AMYLASE in the last 168 hours. CBC: Recent Labs  Lab 07/07/21 1353 07/08/21 0115 07/09/21 0334 07/09/21 0336 07/09/21 1625 07/09/21 2013 07/09/21 2248 07/10/21 0026 07/10/21 0313 07/10/21 0409 07/11/21 0413  WBC 9.6   < > 12.4*  --  15.6*  --  19.1*  --   --  16.3* 13.0*  NEUTROABS 6.6  --   --   --   --   --   --   --   --   --  10.3*  HGB 14.0   < > 10.5*   < > 9.1*   < > 9.3*   < > 8.5* 8.9* 8.4*  HCT 40.6   < > 31.0*   < > 27.4*   < > 27.6*   < > 25.0* 25.8* 24.7*  MCV 92.7   < > 92.5  --  95.8  --  93.6  --    --  92.5 94.3  PLT 205   < > 183  --  181  --  234  --   --  213 148*   < > = values in this interval not displayed.   Blood Culture    Component Value Date/Time   SDES TRACHEAL ASPIRATE 07/10/2021 1120   SPECREQUEST NONE 07/10/2021 1120   CULT  07/10/2021 1120    CULTURE REINCUBATED FOR BETTER GROWTH [place]formed [place] [facility], [place] [address]   REPTSTATUS PENDING 07/10/2021 1120    Cardiac Enzymes: No results for input(s): CKTOTAL, CKMB, CKMBINDEX, TROPONINI in the last 168 hours. CBG: Recent  Labs  Lab 07/11/21 0509 07/11/21 0605 07/11/21 0656 07/11/21 0822 07/11/21 0937  GLUCAP 156* 156* 149* 171* 136*   Iron Studies: No results for input(s): IRON, TIBC, TRANSFERRIN, FERRITIN in the last 72 hours. '@[email or web address]' @ Studies/Results: DG Chest 1 View  Result Date: 07/10/2021 CLINICAL DATA:  A 55 year old male presents for central line placement. EXAM: CHEST  1 VIEW COMPARISON:  July 10, 2021. FINDINGS: Post median sternotomy for CABG and LEFT atrial appendage clipping as before. Chest support tubes entering via subxiphoid approach projects over the LEFT inferior chest and the mediastinum. RIGHT-sided central venous access device terminates in the mid SVC. Endotracheal tube in similar position, tip just at or below the lower margin of clavicular heads approximately 4 cm from the carina. Gastric tube courses through in off the field of the radiograph. Since the prior study there is been interval placement of a LEFT IJ Vas-Cath. This crosses midline as expected, tip in the mid SVC. EKG leads project over the chest. RIGHT basilar and LEFT basilar atelectasis LEFT greater than RIGHT. No visible pneumothorax. On limited assessment there is no acute skeletal process. IMPRESSION: Post median sternotomy for CABG and LEFT atrial appendage clipping as before. LEFT IJ Vas-Cath crosses midline with tip in the mid SVC. No visible pneumothorax. Remaining support  apparatus are stable or incompletely visualized as described. Electronically Signed   By: Zetta Bills M.D.   On: 07/10/2021 12:06   DG Chest Port 1 View  Result Date: 07/11/2021 CLINICAL DATA:  Difficulty breathing, left pleural effusion EXAM: PORTABLE CHEST 1 VIEW COMPARISON:  Previous studies including the examination of 07/10/2021 FINDINGS: Transverse diameter of heart is increased. Thoracic aorta is tortuous and ectatic. There is evidence of previous coronary bypass surgery and previous placement of metallic clip in the area of left atrial appendage. There are no signs of alveolar pulmonary edema. There are linear densities in left lower lung fields suggesting subsegmental atelectasis. There is slight improvement in aeration in the medial left lower lung fields. There is blunting of left lateral CP angle. There is a catheter overlying the lateral aspect of left lower lung fields, possibly chest tube. There is no pneumothorax. Tip of endotracheal tube is at the level aortic arch. Nasogastric tube is noted traversing the esophagus. Tips of central venous catheter is seen in the course of superior vena cava. IMPRESSION: There is slight improvement in aeration of medial left lower lung fields suggesting decrease in atelectasis/pneumonia. Small left pleural effusion with no significant change. There is no pneumothorax. Reading location: [place], [place]. Electronically Signed   By: Elmer Picker M.D.   On: 07/11/2021 08:36   DG CHEST PORT 1 VIEW  Result Date: 07/10/2021 CLINICAL DATA:  Chest tube. EXAM: PORTABLE CHEST 1 VIEW COMPARISON:  July 10, 2021 FINDINGS: Multiple stable support lines are seen. A chest tube is noted with its distal end overlying the lateral aspect of the left lung base. A nasogastric tube is seen with its distal tip approximately 6.3 cm distal to the expected region of the gastroesophageal junction. Multiple sternal wires are present. Mild, stable left basilar  atelectasis is seen with a small, stable left pleural effusion. No pneumothorax is identified. The heart size and mediastinal contours are within normal limits. The visualized skeletal structures are unremarkable. IMPRESSION: 1. Stable support lines and tubes, as described above, with interval nasogastric tube placement and positioning. 2. Stable mild left basilar atelectasis with a small, stable left pleural effusion. Electronically Signed   By: Hoover Browns  Houston M.D.   On: 07/10/2021 02:23   Portable Chest x-ray  Result Date: 07/10/2021 CLINICAL DATA:  Line placement. EXAM: PORTABLE CHEST 1 VIEW COMPARISON:  July 09, 2021 FINDINGS: An endotracheal tube is seen with its distal tip approximately 4.8 cm from the carina. Stable right internal jugular venous catheter and Swan-Ganz catheter positioning is seen. Multiple sternal wires are present. Mild, stable atelectasis is seen within the left lung base. There is a small left pleural effusion. No pneumothorax is identified. The heart size and mediastinal contours are within normal limits. The visualized skeletal structures are unremarkable. IMPRESSION: 1. Interval endotracheal tube placement and positioning, as described above, with mild, stable left basilar atelectasis. 2. Small left pleural effusion. Electronically Signed   By: Virgina Norfolk M.D.   On: 07/10/2021 01:46   ECHOCARDIOGRAM LIMITED  Result Date: 07/10/2021    ECHOCARDIOGRAM LIMITED REPORT   Patient Name:   [name] Date of Exam: 07/10/2021 Medical Rec #:  [id number]      Height:       74.0 in Accession #:    [id number]     Weight:       263.2 lb Date of Birth:  [birth year]      BSA:          2.443 m Patient Age:    70 years       BP:           120/58 mmHg Patient Gender: M              HR:           131 bpm. Exam Location:  Inpatient Procedure: Limited Echo, Cardiac Doppler and Color Doppler Indications:    I31.3 Pericardial effusion (noninflammatory)  History:        Patient has  prior history of Echocardiogram examinations, most                 recent 06/25/2021. Previous Myocardial Infarction and CAD, Prior                 CABG and Abnormal ECG, Arrythmias:SVT, Signs/Symptoms:Shortness                 of Breath, Dyspnea and Chest Pain; Risk Factors:Hypertension and                 Diabetes. Cardiac shock.  Sonographer:    Roseanna Rainbow RDCS Referring Phys: 19 RAKESH V ALVA  Sonographer Comments: Technically difficult study due to poor echo windows, patient is morbidly obese and echo performed with patient supine and on artificial respirator. Image acquisition challenging due to patient body habitus. Wound dressing in subcostal region. IMPRESSIONS  1. Left ventricular ejection fraction, by estimation, is 50 to 55% which may be related to tachycardia. The left ventricle has low normal function. The left ventricle has no regional wall motion abnormalities. There is mild concentric left ventricular hypertrophy.  2. Right ventricular systolic function is normal. The right ventricular size is normal.  3. The mitral valve is normal in structure. No evidence of mitral valve regurgitation. No evidence of mitral stenosis. Moderate mitral annular calcification.  4. The aortic valve was not well visualized. Aortic valve regurgitation is not visualized. Mild to moderate aortic valve sclerosis/calcification is present, without any evidence of aortic stenosis.  5. Aortic dilatation noted. There is mild dilatation of the aortic root, measuring 40 mm.  6. The inferior vena cava is normal in size with greater than 50% respiratory variability,  suggesting right atrial pressure of 3 mmHg.  7. A trivial pericardial effusion is posterior to the left ventricle.  8. Consider repeat limited echo for LVF once HR slowed down. FINDINGS  Left Ventricle: Left ventricular ejection fraction, by estimation, is 55 to 60%. The left ventricle has normal function. The left ventricle has no regional wall motion abnormalities. The  left ventricular internal cavity size was normal in size. There is  mild concentric left ventricular hypertrophy. Left ventricular diastolic function could not be evaluated. Right Ventricle: The right ventricular size is normal. No increase in right ventricular wall thickness. Right ventricular systolic function is normal. Left Atrium: Left atrial size was normal in size. Right Atrium: Right atrial size was normal in size. Pericardium: Trivial pericardial effusion is present. The pericardial effusion is posterior to the left ventricle. Mitral Valve: The mitral valve is normal in structure. There is mild thickening of the mitral valve leaflet(s). Moderate mitral annular calcification. No evidence of mitral valve stenosis. Tricuspid Valve: The tricuspid valve is normal in structure. Tricuspid valve regurgitation is not demonstrated. No evidence of tricuspid stenosis. Aortic Valve: The aortic valve was not well visualized. Aortic valve regurgitation is not visualized. Mild to moderate aortic valve sclerosis/calcification is present, without any evidence of aortic stenosis. Pulmonic Valve: The pulmonic valve was normal in structure. Pulmonic valve regurgitation is not visualized. No evidence of pulmonic stenosis. Aorta: The aortic root is normal in size and structure and aortic dilatation noted. There is mild dilatation of the aortic root, measuring 40 mm. Venous: The inferior vena cava is normal in size with greater than 50% respiratory variability, suggesting right atrial pressure of 3 mmHg. IAS/Shunts: No atrial level shunt detected by color flow Doppler. LEFT VENTRICLE PLAX 2D LVIDd:         5.20 cm LVIDs:         3.80 cm LV PW:         1.45 cm LV IVS:        1.25 cm  LEFT ATRIUM         Index LA diam:    3.50 cm 1.43 cm/m   AORTA Ao Root diam: 4.00 cm Fransico Him MD Electronically signed by Fransico Him MD Signature Date/Time: 07/10/2021/10:02:47 AM    Final    Medications:   prismasol BGK 4/2.5 400 mL/hr at  07/11/21 0109    prismasol BGK 4/2.5 300 mL/hr at 07/11/21 0555   sodium chloride     sodium chloride     sodium chloride     albumin human 12.5 g (07/08/21 1827)   And   sodium chloride Stopped (07/08/21 2051)   amiodarone 60 mg/hr (07/11/21 0900)   ceFEPime (MAXIPIME) IV 2 g (07/11/21 0904)   dexmedetomidine (PRECEDEX) IV infusion 1 mcg/kg/hr (07/11/21 0947)   dextrose     feeding supplement (VITAL 1.5 CAL) 20 mL/hr at 07/10/21 2000   fentaNYL infusion INTRAVENOUS 170 mcg/hr (07/11/21 0900)   insulin 4.8 Units/hr (07/11/21 0900)   lactated ringers     lactated ringers 10 mL/hr at 07/11/21 0900   lactated ringers     midazolam 2 mg/hr (07/11/21 0908)   norepinephrine (LEVOPHED) Adult infusion 10 mcg/min (07/11/21 0900)   prismasol BGK 4/2.5 1,500 mL/hr at 07/11/21 0554   promethazine (PHENERGAN) injection (IM or IVPB) Stopped (07/09/21 1628)   vasopressin Stopped (07/11/21 0911)    acetaminophen  1,000 mg Oral Q6H   Or   acetaminophen (TYLENOL) oral liquid 160 mg/5 mL  1,000 mg  Per Tube Q6H   aspirin EC  81 mg Oral Daily   Or   aspirin  81 mg Per Tube Daily   atorvastatin  80 mg Oral Daily   B-complex with vitamin C  1 tablet Per Tube Daily   bisacodyl  10 mg Oral Daily   Or   bisacodyl  10 mg Rectal Daily   chlorhexidine gluconate (MEDLINE KIT)  15 mL Mouth Rinse BID   Chlorhexidine Gluconate Cloth  6 each Topical Daily   clopidogrel  75 mg Per Tube Daily   docusate  100 mg Per Tube BID   heparin injection (subcutaneous)  5,000 Units Subcutaneous Q8H   insulin aspart  2-6 Units Subcutaneous Q4H   insulin detemir  18 Units Subcutaneous Q12H   mouth rinse  15 mL Mouth Rinse 10 times per day   pantoprazole sodium  40 mg Per Tube Daily   polyethylene glycol  17 g Per Tube BID   sodium chloride flush  10-40 mL Intracatheter Q12H   sodium chloride flush  3 mL Intravenous Q12H     Assessment/Plan: 55yo M DM, HTN, A fib, CKD currently being followed for severe AKI s/p CABG  x 4 and MAZE.   **Severe AKI on CKD 3b:  Baseline 1.6, was 1.9 prior to CABG and now AKI in setting of hemodynamic instability post op.  Initiated CRRT 10/27 for UF with ~anuria and volume overload.  Was ~ net even yesterday.  HD catheter has been pulled out during agitated episode today and will need to be replaced - PCCM will do, appreciated.  Will resume CRRT following.  Followed by Dr. Moshe Cipro outpt.    **Anemia: Hb 8s, transfuse PRN.    **Hyponatremia: mild, Na 134, this AM.  Will resolve with CRRT.   **CAD s/p CABG  **A fib: amiodarone  **DM: per primary  Jannifer Hick MD 07/11/2021, 10:02 AM  [facility] Pager: [phone]

## 2021-07-11 NOTE — Progress Notes (Addendum)
[address]       [place] [address]             [phone]      3 Days Post-Op  Procedure(s) (LRB): CORONARY ARTERY BYPASS GRAFTING (CABG) TIMES FOUR USING LEFT INTERNAL MAMMARY ARTERY, GREATER SAPHENOUS VEIN HARVESTED ENDOSCOPICALLY, AND LEFT RADIAL ARTERY HARVESTED OPEN (N/A) TRANSESOPHAGEAL ECHOCARDIOGRAM (TEE) (N/A) RADIAL ARTERY HARVEST (Left) ENDOVEIN HARVEST OF GREATER SAPHENOUS VEIN (Right) MAZE   Total Length of Stay:  LOS: 4 days    SUBJECTIVE:  Vitals:   07/11/21 1745 07/11/21 1800  BP:    Pulse: 66 66  Resp: (!) 5 (!) 0  Temp: 99 F (37.2 C) 98.8 F (37.1 C)  SpO2: 100% 100%    Intake/Output      10/28 0701 10/29 0700   I.V. (mL/kg) 1469.2 (12.1)   Other    NG/GT 360   IV Piggyback 100   Total Intake(mL/kg) 1929.2 (15.9)   Urine (mL/kg/hr) 85 (0.1)   Other 1626   Chest Tube 20   Total Output 1731   Net +198.2            prismasol BGK 4/2.5 400 mL/hr at 07/11/21 0109    prismasol BGK 4/2.5 300 mL/hr at 07/11/21 0555   sodium chloride     sodium chloride     sodium chloride     albumin human 12.5 g (07/08/21 1827)   And   sodium chloride Stopped (07/08/21 2051)   amiodarone 60 mg/hr (07/11/21 1900)   ceFEPime (MAXIPIME) IV Stopped (07/11/21 0935)   dexmedetomidine (PRECEDEX) IV infusion 1.2 mcg/kg/hr (07/11/21 1900)   dextrose     feeding supplement (VITAL 1.5 CAL) 20 mL/hr at 07/10/21 2000   fentaNYL infusion INTRAVENOUS 200 mcg/hr (07/11/21 1900)   heparin 1,000 Units/hr (07/11/21 1900)   insulin Stopped (07/11/21 1302)   lactated ringers     lactated ringers 10 mL/hr at 07/11/21 1900   lactated ringers     midazolam 6 mg/hr (07/11/21 1900)   norepinephrine (LEVOPHED) Adult infusion 6 mcg/min (07/11/21 1900)   prismasol BGK 4/2.5 1,500 mL/hr at 07/11/21 1819   promethazine (PHENERGAN) injection (IM or IVPB) Stopped (07/09/21 1628)    CBC    Component Value Date/Time   WBC 13.0 (H) 07/11/2021 0413    RBC 2.62 (L) 07/11/2021 0413   HGB 8.4 (L) 07/11/2021 0413   HCT 24.7 (L) 07/11/2021 0413   PLT 148 (L) 07/11/2021 0413   MCV 94.3 07/11/2021 0413   MCH 32.1 07/11/2021 0413   MCHC 34.0 07/11/2021 0413   RDW 13.0 07/11/2021 0413   LYMPHSABS 1.4 07/11/2021 0413   MONOABS 1.1 (H) 07/11/2021 0413   EOSABS 0.0 07/11/2021 0413   BASOSABS 0.0 07/11/2021 0413   CMP     Component Value Date/Time   NA 131 (L) 07/11/2021 1602   K 4.7 07/11/2021 1602   CL 98 07/11/2021 1602   CO2 24 07/11/2021 1602   GLUCOSE 239 (H) 07/11/2021 1602   BUN 30 (H) 07/11/2021 1602   BUN 31 02/26/2016 0000   CREATININE 4.67 (H) 07/11/2021 1602   CREATININE 1.02 09/09/2015 1710   CALCIUM 7.2 (L) 07/11/2021 1602   PROT 5.2 (L) 07/10/2021 0937   ALBUMIN 2.0 (L) 07/11/2021 1602   AST 29 07/10/2021 0937   ALT 5 07/10/2021 0937   ALKPHOS 40 07/10/2021 0937   BILITOT 0.6 07/10/2021 0937   GFRNONAA 14 (L) 07/11/2021 1602   GFRAA >  60 07/07/2017 0429   ABG    Component Value Date/Time   PHART 7.359 07/10/2021 0313   PCO2ART 48.0 07/10/2021 0313   PO2ART 89 07/10/2021 0313   HCO3 27.2 07/10/2021 0313   TCO2 29 07/10/2021 0313   ACIDBASEDEF 9.0 (H) 07/10/2021 0303   O2SAT 56.8 07/11/2021 1202   CBG (last 3)  Recent Labs    07/11/21 1150 07/11/21 1259 07/11/21 1600  GLUCAP 131* 148* 233*     ASSESSMENT:  Remains sedated on ventilator  Remains on Amiodarone at 60 and [name] at 6  HD catheter came out today, per nephrology recs was placed and CRRT restarted, patient is making some urine, creatinine improved to 4.67  Trickle tube feeds  Erin Barrett, PA-C 07/11/21 7:10 PM   Agree with above Continue renal and vent support  Riordan Walle O Anayeli Arel

## 2021-07-11 NOTE — Progress Notes (Signed)
ANTICOAGULATION CONSULT NOTE  Pharmacy Consult for heparin Indication: atrial fibrillation  No Known Allergies  Patient Measurements: Height: 6\' 2"  (188 cm) Weight: 121.3 kg (267 lb 6.7 oz) IBW/kg (Calculated) : 82.2 Heparin Dosing Weight: 142 kg  Vital Signs: Temp: 98.8 F (37.1 C) (10/28 2013) Temp Source: Bladder (10/28 1600) BP: 87/48 (10/28 2013) Pulse Rate: 84 (10/28 2013)  Labs: Recent Labs    07/09/21 2248 07/10/21 0026 07/10/21 0409 07/10/21 0937 07/10/21 1555 07/11/21 0403 07/11/21 0413 07/11/21 1602 07/11/21 2031 07/11/21 2035  HGB 9.3*   < > 8.9*  --   --   --  8.4*  --   --  8.5*  HCT 27.6*   < > 25.8*  --   --   --  24.7*  --   --  25.0*  PLT 234  --  213  --   --   --  148*  --   --   --   LABPROT  --   --   --  15.0  --   --   --   --   --   --   INR  --   --   --  1.2  --   --   --   --   --   --   HEPARINUNFRC  --   --   --   --   --   --   --   --  0.13*  --   CREATININE  --   --  5.07*  --  5.52* 4.43*  --  4.67*  --   --    < > = values in this interval not displayed.     Estimated Creatinine Clearance: 24.4 mL/min (A) (by C-G formula based on SCr of 4.67 mg/dL (H)).   Medical History: Past Medical History:  Diagnosis Date   Anxiety    Depression    Diabetic neuropathy ([place])    Diabetic retinopathy ([place])    visual impairment   Dysrhythmia    "palpatations sometimes" (06/30/2017)   Exertional dyspnea 06/30/2021   Family history of adverse reaction to anesthesia    sister had OR 2016; "couldn't wake up; could hear what they were saying but couldn't get their attention; like I was paralyzed but fully awake" (06/30/2017)   Pneumonia X 2   SVT (supraventricular tachycardia) ([place])    Toe ulcer ([place]) 06/30/2017   2nd digit   Type II diabetes mellitus ([place])     Medications:  Medications Prior to Admission  Medication Sig Dispense Refill Last Dose   alprazolam (XANAX) 2 MG tablet Take 1 tablet (2 mg total) by mouth 3 (three) times  daily as needed for anxiety. 2 tablet 0 Past Week   B-D ULTRAFINE III SHORT PEN 31G X 8 MM MISC USE TO ADMINISTER INSULIN DAILY E11..39  2 Past Week   diclofenac Sodium (VOLTAREN) 1 % GEL Apply topically as needed.   Past Month   gabapentin (NEURONTIN) 100 MG capsule Take 200 mg by mouth 2 (two) times daily. Per patient, takes all 4 capsules at Bedtime  5 Past Week   Lancets (FREESTYLE) lancets CHECK BLOOD SUGAR 3 TIMES DAILY  6 Past Week   linagliptin (TRADJENTA) 5 MG TABS tablet Take 5 mg by mouth daily. Reported on 09/09/2015   Past Week   MAGNESIUM CITRATE PO Take 1 scoop by mouth daily as needed.   Past Week   methocarbamol (ROBAXIN) 500 MG tablet Take 500  mg by mouth 3 (three) times daily.   Past Week   Multiple Vitamin (MULTIVITAMIN WITH MINERALS) TABS tablet Take 1 tablet by mouth daily.   Past Week   ramipril (ALTACE) 10 MG capsule Take 20 mg by mouth daily.   Past Week   traZODone (DESYREL) 100 MG tablet Take 100 mg by mouth at bedtime. Per pt - takes 75 mg (splits tablet in half; and then quarters - then takes 3/4 of tablet nightly)   Past Week   TRESIBA FLEXTOUCH 100 UNIT/ML FlexTouch Pen Inject 0-30 Units into the skin. Inject 10-30 units subQ PRN. Checks CBG with Libre. Gives insulin if "sugar is high"   Past Week   VASCEPA 1 g capsule Take 2 g by mouth 2 (two) times daily.   Past Week   amLODipine (NORVASC) 5 MG tablet Take 1 tablet (5 mg total) by mouth daily. (Patient not taking: Reported on 07/09/2021) 30 tablet 0 Not Taking   carvedilol (COREG) 12.5 MG tablet Take 1 tablet (12.5 mg total) by mouth 2 (two) times daily. (Patient not taking: Reported on 07/09/2021) 180 tablet 3 Not Taking   Scheduled:   acetaminophen  1,000 mg Oral Q6H   Or   acetaminophen (TYLENOL) oral liquid 160 mg/5 mL  1,000 mg Per Tube Q6H   aspirin EC  81 mg Oral Daily   Or   aspirin  81 mg Per Tube Daily   atorvastatin  80 mg Oral Daily   B-complex with vitamin C  1 tablet Per Tube Daily   bisacodyl   10 mg Oral Daily   Or   bisacodyl  10 mg Rectal Daily   chlorhexidine gluconate (MEDLINE KIT)  15 mL Mouth Rinse BID   Chlorhexidine Gluconate Cloth  6 each Topical Daily   clonazePAM  2 mg Per Tube BID   clopidogrel  75 mg Per Tube Daily   docusate  100 mg Per Tube BID   insulin aspart  2-6 Units Subcutaneous Q4H   insulin detemir  18 Units Subcutaneous Q12H   mouth rinse  15 mL Mouth Rinse 10 times per day   pantoprazole sodium  40 mg Per Tube Daily   polyethylene glycol  17 g Per Tube BID   sodium chloride flush  10-40 mL Intracatheter Q12H   sodium chloride flush  3 mL Intravenous Q12H   Infusions:    prismasol BGK 4/2.5 400 mL/hr at 07/11/21 0109    prismasol BGK 4/2.5 300 mL/hr at 07/11/21 0555   sodium chloride     sodium chloride     sodium chloride     albumin human 12.5 g (07/08/21 1827)   And   sodium chloride Stopped (07/08/21 2051)   amiodarone 60 mg/hr (07/11/21 2000)   ceFEPime (MAXIPIME) IV Stopped (07/11/21 0935)   dexmedetomidine (PRECEDEX) IV infusion 1.2 mcg/kg/hr (07/11/21 2000)   dextrose     feeding supplement (VITAL 1.5 CAL) 20 mL/hr at 07/10/21 2000   fentaNYL infusion INTRAVENOUS 200 mcg/hr (07/11/21 2000)   heparin 1,000 Units/hr (07/11/21 2000)   insulin Stopped (07/11/21 1302)   lactated ringers     lactated ringers 10 mL/hr at 07/11/21 2000   lactated ringers     midazolam 4 mg/hr (07/11/21 2000)   norepinephrine (LEVOPHED) Adult infusion 6 mcg/min (07/11/21 2000)   prismasol BGK 4/2.5 1,500 mL/hr at 07/11/21 1819   promethazine (PHENERGAN) injection (IM or IVPB) Stopped (07/09/21 1628)    Assessment: Patient is s/p CABG with MAZE procedure on  10/25. Has had recurrent afib previously resolved with amiodarone. Pharmacy has been consulted to start anticoagulation with heparin. Considering recent CABG, will plan to start with lower dose and titrate per levels. Hemoglobin 8.4 and platelets 148k.  Heparin level this evening came back  subtherapeutic at 0.13, on 1000 units/hr. No s/sx of bleeding or infusion issues.   Goal of Therapy:  Heparin level 0.3-0.5 Monitor platelets by anticoagulation protocol: Yes   Plan:  Increase heparin infusion to 1200 units/hr Check 8 hour heparin level Check daily heparin level and CBC  Thank you for allowing pharmacy to participate in this patient's care.  Antonietta Jewel, PharmD, [place] Clinical Pharmacist  Phone: [phone] 07/11/2021 9:10 PM  Please check AMION for all [facility] phone numbers After 10:00 PM, call [place] [phone]

## 2021-07-11 NOTE — Progress Notes (Addendum)
Advanced Heart Failure Rounding Note  PCP-Cardiologist: None   Subjective:     10/25 S/P CABG x4 with MAZE . Intra Op EF 20%.  10/27  Intubated. Hypotensive. Placed on Norepi+ Vaso. Recurrent A fib with amio increased to 60 mg. Remains on CRRT   Back in NSR today.   Remains intubated- oxygen requirement down 40%. PEEP 15   On CRRT- pulling 50-100   Currently on vasopressin 0.04 units, norepi and amio 60 mg   Flow Trak - SVR 978 CO 4.6 CI 1.7    Objective:   Weight Range: 121.3 kg Body mass index is 34.33 kg/m.   Vital Signs:   Temp:  [96.1 F (35.6 C)-99.1 F (37.3 C)] 97.5 F (36.4 C) (10/28 0630) Pulse Rate:  [43-152] 44 (10/28 0630) Resp:  [0-29] 20 (10/28 0630) BP: (89-133)/(58-87) 117/87 (10/28 0000) SpO2:  [97 %-100 %] 100 % (10/28 0630) Arterial Line BP: (81-258)/(45-252) 121/72 (10/28 0630) FiO2 (%):  [40 %-80 %] 40 % (10/28 0404) Weight:  [121.3 kg] 121.3 kg (10/28 0500) Last BM Date:  (PTA)  Weight change: Filed Weights   07/09/21 0407 07/10/21 0500 07/11/21 0500  Weight: 120.8 kg 119.4 kg 121.3 kg    Intake/Output:   Intake/Output Summary (Last 24 hours) at 07/11/2021 0735 Last data filed at 07/11/2021 0700 Gross per 24 hour  Intake 3685.28 ml  Output 4068 ml  Net -382.72 ml      Physical Exam  General:  Intubated  on CRRT HEENT: ETT Neck: supple. no JVD. Carotids 2+ bilat; no bruits. No lymphadenopathy or thryomegaly appreciated. RIJ LIJ HD catheter Cor: PMI nondisplaced. Regular rate & rhythm. No rubs, gallops or murmurs. Lungs: Rhonchi throughout Abdomen: soft, nontender, nondistended. No hepatosplenomegaly. No bruits or masses. Good bowel sounds. Extremities: no cyanosis, clubbing, rash, edema Neuro: Intubated /sedated  GU: Foley  Telemetry   SR 80-90s frequent PVC. In and A fib   EKG    N/A   Labs    CBC Recent Labs    07/10/21 0409 07/11/21 0413  WBC 16.3* 13.0*  NEUTROABS  --  10.3*  HGB 8.9* 8.4*  HCT  25.8* 24.7*  MCV 92.5 94.3  PLT 213 148*   Basic Metabolic Panel Recent Labs    28/76/81 1555 07/11/21 0403  NA 137 134*  K 4.2 4.8  CL 100 100  CO2 25 25  GLUCOSE 152* 227*  BUN 37* 29*  CREATININE 5.52* 4.43*  CALCIUM 7.7* 7.5*  MG 2.5* 2.5*  PHOS 5.0* 4.2   Liver Function Tests Recent Labs    07/10/21 0937 07/10/21 1555 07/11/21 0403  AST 29  --   --   ALT 5  --   --   ALKPHOS 40  --   --   BILITOT 0.6  --   --   PROT 5.2*  --   --   ALBUMIN 2.3* 2.3* 2.2*   No results for input(s): LIPASE, AMYLASE in the last 72 hours. Cardiac Enzymes No results for input(s): CKTOTAL, CKMB, CKMBINDEX, TROPONINI in the last 72 hours.  BNP: BNP (last 3 results) No results for input(s): BNP in the last 8760 hours.  ProBNP (last 3 results) No results for input(s): PROBNP in the last 8760 hours.   D-Dimer No results for input(s): DDIMER in the last 72 hours. Hemoglobin A1C No results for input(s): HGBA1C in the last 72 hours.  Fasting Lipid Panel No results for input(s): CHOL, HDL, LDLCALC, TRIG, CHOLHDL, LDLDIRECT  in the last 72 hours.  Thyroid Function Tests No results for input(s): TSH, T4TOTAL, T3FREE, THYROIDAB in the last 72 hours.  Invalid input(s): FREET3  Other results:   Imaging    DG Chest 1 View  Result Date: 07/10/2021 CLINICAL DATA:  A 55 year old male presents for central line placement. EXAM: CHEST  1 VIEW COMPARISON:  July 10, 2021. FINDINGS: Post median sternotomy for CABG and LEFT atrial appendage clipping as before. Chest support tubes entering via subxiphoid approach projects over the LEFT inferior chest and the mediastinum. RIGHT-sided central venous access device terminates in the mid SVC. Endotracheal tube in similar position, tip just at or below the lower margin of clavicular heads approximately 4 cm from the carina. Gastric tube courses through in off the field of the radiograph. Since the prior study there is been interval placement of a  LEFT IJ Vas-Cath. This crosses midline as expected, tip in the mid SVC. EKG leads project over the chest. RIGHT basilar and LEFT basilar atelectasis LEFT greater than RIGHT. No visible pneumothorax. On limited assessment there is no acute skeletal process. IMPRESSION: Post median sternotomy for CABG and LEFT atrial appendage clipping as before. LEFT IJ Vas-Cath crosses midline with tip in the mid SVC. No visible pneumothorax. Remaining support apparatus are stable or incompletely visualized as described. Electronically Signed   By: Zetta Bills M.D.   On: 07/10/2021 12:06   ECHOCARDIOGRAM LIMITED  Result Date: 07/10/2021    ECHOCARDIOGRAM LIMITED REPORT   Patient Name:   Billy [name] Date of Exam: 07/10/2021 Medical Rec #:  [id number]      Height:       74.0 in Accession #:    [id number]     Weight:       263.2 lb Date of Birth:  1966-06-16      BSA:          2.443 m Patient Age:    55 years       BP:           120/58 mmHg Patient Gender: M              HR:           131 bpm. Exam Location:  Inpatient Procedure: Limited Echo, Cardiac Doppler and Color Doppler Indications:    I31.3 Pericardial effusion (noninflammatory)  History:        Patient has prior history of Echocardiogram examinations, most                 recent 06/25/2021. Previous Myocardial Infarction and CAD, Prior                 CABG and Abnormal ECG, Arrythmias:SVT, Signs/Symptoms:Shortness                 of Breath, Dyspnea and Chest Pain; Risk Factors:Hypertension and                 Diabetes. Cardiac shock.  Sonographer:    Roseanna Rainbow RDCS Referring Phys: 98 RAKESH V ALVA  Sonographer Comments: Technically difficult study due to poor echo windows, patient is morbidly obese and echo performed with patient supine and on artificial respirator. Image acquisition challenging due to patient body habitus. Wound dressing in subcostal region. IMPRESSIONS  1. Left ventricular ejection fraction, by estimation, is 50 to 55% which may be related to  tachycardia. The left ventricle has low normal function. The left ventricle has no regional wall motion abnormalities. There is mild  concentric left ventricular hypertrophy.  2. Right ventricular systolic function is normal. The right ventricular size is normal.  3. The mitral valve is normal in structure. No evidence of mitral valve regurgitation. No evidence of mitral stenosis. Moderate mitral annular calcification.  4. The aortic valve was not well visualized. Aortic valve regurgitation is not visualized. Mild to moderate aortic valve sclerosis/calcification is present, without any evidence of aortic stenosis.  5. Aortic dilatation noted. There is mild dilatation of the aortic root, measuring 40 mm.  6. The inferior vena cava is normal in size with greater than 50% respiratory variability, suggesting right atrial pressure of 3 mmHg.  7. A trivial pericardial effusion is posterior to the left ventricle.  8. Consider repeat limited echo for LVF once HR slowed down. FINDINGS  Left Ventricle: Left ventricular ejection fraction, by estimation, is 55 to 60%. The left ventricle has normal function. The left ventricle has no regional wall motion abnormalities. The left ventricular internal cavity size was normal in size. There is  mild concentric left ventricular hypertrophy. Left ventricular diastolic function could not be evaluated. Right Ventricle: The right ventricular size is normal. No increase in right ventricular wall thickness. Right ventricular systolic function is normal. Left Atrium: Left atrial size was normal in size. Right Atrium: Right atrial size was normal in size. Pericardium: Trivial pericardial effusion is present. The pericardial effusion is posterior to the left ventricle. Mitral Valve: The mitral valve is normal in structure. There is mild thickening of the mitral valve leaflet(s). Moderate mitral annular calcification. No evidence of mitral valve stenosis. Tricuspid Valve: The tricuspid valve is  normal in structure. Tricuspid valve regurgitation is not demonstrated. No evidence of tricuspid stenosis. Aortic Valve: The aortic valve was not well visualized. Aortic valve regurgitation is not visualized. Mild to moderate aortic valve sclerosis/calcification is present, without any evidence of aortic stenosis. Pulmonic Valve: The pulmonic valve was normal in structure. Pulmonic valve regurgitation is not visualized. No evidence of pulmonic stenosis. Aorta: The aortic root is normal in size and structure and aortic dilatation noted. There is mild dilatation of the aortic root, measuring 40 mm. Venous: The inferior vena cava is normal in size with greater than 50% respiratory variability, suggesting right atrial pressure of 3 mmHg. IAS/Shunts: No atrial level shunt detected by color flow Doppler. LEFT VENTRICLE PLAX 2D LVIDd:         5.20 cm LVIDs:         3.80 cm LV PW:         1.45 cm LV IVS:        1.25 cm  LEFT ATRIUM         Index LA diam:    3.50 cm 1.43 cm/m   AORTA Ao Root diam: 4.00 cm Fransico Him MD Electronically signed by Fransico Him MD Signature Date/Time: 07/10/2021/10:02:47 AM    Final      Medications:     Scheduled Medications:  acetaminophen  1,000 mg Oral Q6H   Or   acetaminophen (TYLENOL) oral liquid 160 mg/5 mL  1,000 mg Per Tube Q6H   aspirin EC  81 mg Oral Daily   Or   aspirin  81 mg Per Tube Daily   atorvastatin  80 mg Oral Daily   B-complex with vitamin C  1 tablet Per Tube Daily   bisacodyl  10 mg Oral Daily   Or   bisacodyl  10 mg Rectal Daily   chlorhexidine gluconate (MEDLINE KIT)  15 mL Mouth  Rinse BID   Chlorhexidine Gluconate Cloth  6 each Topical Daily   clopidogrel  75 mg Per Tube Daily   docusate  100 mg Per Tube BID   heparin injection (subcutaneous)  5,000 Units Subcutaneous Q8H   mouth rinse  15 mL Mouth Rinse 10 times per day   pantoprazole sodium  40 mg Per Tube Daily   polyethylene glycol  17 g Per Tube Daily   sodium chloride flush  10-40 mL  Intracatheter Q12H   sodium chloride flush  3 mL Intravenous Q12H    Infusions:   prismasol BGK 4/2.5 400 mL/hr at 07/11/21 0109    prismasol BGK 4/2.5 300 mL/hr at 07/11/21 0555   sodium chloride     sodium chloride     sodium chloride     albumin human 12.5 g (07/08/21 1827)   And   sodium chloride Stopped (07/08/21 2051)   amiodarone 60 mg/hr (07/11/21 0700)   ceFEPime (MAXIPIME) IV Stopped (07/10/21 2220)   dexmedetomidine (PRECEDEX) IV infusion 1.2 mcg/kg/hr (07/11/21 0700)   feeding supplement (VITAL 1.5 CAL) 20 mL/hr at 07/10/21 2000   fentaNYL infusion INTRAVENOUS 200 mcg/hr (07/11/21 0700)   insulin 3.2 Units/hr (07/11/21 0700)   lactated ringers     lactated ringers 10 mL/hr at 07/11/21 0700   lactated ringers     norepinephrine (LEVOPHED) Adult infusion 10 mcg/min (07/11/21 0700)   prismasol BGK 4/2.5 1,500 mL/hr at 07/11/21 0554   promethazine (PHENERGAN) injection (IM or IVPB) Stopped (07/09/21 1628)   vasopressin 0.04 Units/min (07/11/21 0700)    PRN Medications: sodium chloride, albumin human **AND** sodium chloride, ALPRAZolam, dextrose, fentaNYL, heparin, heparin, lactated ringers, metoprolol tartrate, midazolam, midazolam, morphine injection, ondansetron (ZOFRAN) IV, oxyCODONE, promethazine (PHENERGAN) injection (IM or IVPB), sodium chloride, sodium chloride flush, sodium chloride flush, traMADol    Patient Profile   Billy [name] is a 77 year with a history of visually impaired, DM, HTN, depression, anxiety takes 2 mg xanax 3 times a day, SVT, CKD Stage II, PAF, and CAD.Marland Kitchen   Anterior Stemi--> CABG--> cardiogenic shock.   Assessment/Plan   Anterior STEMI---> PCI ---> S/P 10/25 CABG x4 with radial harvest.--> cardiogenic shock   ECHO 06/25/2021 EF 50-55%. Intraoperative TEE 20% . Will need another ECHO once off pressors.  -ON norepi 9 mcg + vasopressin 0.04 units. CO-OX 52%  - On CRRT -Keep off bb - No ARB, MRA with creatinine > 5    2. Acute Hypoxic  Respiratory Failure Required intubation 10/27. Oxygen requirements down to 40% with stable sats.  CCM managing.   3. PAF -S/P MAZE.  -Yesterday had recurrent A fib. Given multiple amio boluses.  - In SR today.  -Per Dr [name] if  A fib reoccurs can started heparin.   4. HTN  Now in shock.    4.  DMII  -Hgb 6.7  -On SSI    5. AKI on CKD Stage IIb.   - Followed in the community by Dr Moshe Cipro.  - Creatinine baseline 1.6-1.8 -Creatinine 1.9 on admit -->2.1>3.1-> 5 -->4.43  -? Contrast nephropathy  - 10/27 Started CRRT    6..H/O SVT ablation 25 year ago  7. Anemia  - Expected blood loss.  - Hgb 14--> blood loss 8.4 - CT output slowing   7. Social  Visually impaired. Lives alone. He does not drive. If discharges to home he will need HF Paramedicine.    Length of Stay: [place], NP  07/11/2021, 7:35 AM  Advanced  Heart Failure Team Pager [phone] (M-F; 7a - 5p)  Please contact [facility] for night-coverage after hours (5p -7a ) and weekends on [email or web address]  Patient seen with NP, agree with the above note.   He went back into NSR overnight, HR in 80s.  Co-ox 52% with CI by Flotrack 1.7.  Pulling UF via CVVH 50-100 cc/hr net negative.  CVP 16. He remains on vasopressin 0.04 and NE 9.  He is on amiodarone 60 mg/hr .   General: Intubated/sedated.  Neck: JVP 14+, no thyromegaly or thyroid nodule.  Lungs: Decreased at bases. CV: Nondisplaced PMI.  Heart regular S1/S2, no S3/S4, no murmur.  1+ ankle edema.  Abdomen: Soft,  no hepatosplenomegaly, no distention.  Skin: Intact without lesions or rashes.  Neurologic: Sedated on vent.  Extremities: No clubbing or cyanosis.  HEENT: Normal.   1. CAD: Admitted with anterolateral MI from occluded diagonal treated with DES.  Patient then had CABG with LIMA-LAD, SVG-D, SVG-PDA, and left radial-OM for complete revascularization.    - Continue ASA 81. Will likely drop this at discharge given need for Plavix and  anticoagulation.  - Continue Plavix 75 today - Will eventually need to be anticoagulated for atrial fibrillation.  - atorvastatin 80 mg daily.  - Amlodipine for radial harvest held with hypotension on pressors.    2. Atrial fibrillation: Paroxysmal, s/p Maze and appendage clipping.  He was in AF with RVR on 10/27, now appears to be back in NSR.  - Amiodarone gtt 60 mg/hr for today.  - Will need to eventually start anticoagulation, surgery recommends holding off on systemic heparin today unless AF recurs.  - ECG.  3. AKI on CKD stage 3: Baseline creatinine around 1.6, up to 5 post-op.  He has likely had multiple hits including contrast with initial cath/PCI and possible peri-operative hypotension.  Suspect ATN is present, will need to support him for now and hopefully function will eventually improve.  Now significantly volume overloaded. He is on CVVH.  - Aim for net negative UF 100 cc/hr today.  4. Acute systolic CHF: Ischemic cardiomyopathy.  Echo prior to this admission with EF 50-55% (pre-MI and CABG).  Intraoperative TEE with EF 20%.  CVP 16 today, on NE 9 + vasopressin 0.04.   Limited echo on 10/27 showed only a small posterior pericardial effusion, no tamponade.  HR was in 140s, but EF seemed only mildly decreased (50% range).  Co-ox is low today at 52% with CI 1.7 off Flotrack.  - Would keep current NE and preferentially wean vasopressin with low co-ox.  Repeat co-ox today.  - He will need CVVH for volume management, pulling net negative 100 cc/hr today.  - Repeat limited echo for EF now that HR is in the 80s.  5. Anemia: Post-op.   6. ID: Afebrile today but WBCs 13.   - Continue empiric cefepime.  7. Acute hypoxemic respiratory failure: Suspect pulmonary edema, now intubated.  - Per CCM.  - Fluid removal via CVVH.  8. Thrombocytopenia: Mild, post-op.   CRITICAL CARE Performed by: Loralie Champagne  Total critical care time: 40 minutes  Critical care time was exclusive of separately  billable procedures and treating other patients.  Critical care was necessary to treat or prevent imminent or life-threatening deterioration.  Critical care was time spent personally by me on the following activities: development of treatment plan with patient and/or surrogate as well as nursing, discussions with consultants, evaluation of patient's response to treatment, examination of patient, obtaining history  from patient or surrogate, ordering and performing treatments and interventions, ordering and review of laboratory studies, ordering and review of radiographic studies, pulse oximetry and re-evaluation of patient's condition.  Loralie Champagne 07/11/2021 8:20 AM

## 2021-07-11 NOTE — Progress Notes (Signed)
NAME:  Billy [name], MRN:  [id number], DOB:  09/01/1966, LOS: 4 ADMISSION DATE:  07/07/2021, CONSULTATION DATE:  07/11/2021  REFERRING MD:  Lynnette Caffey, CHIEF COMPLAINT:  cardiogenic shock , on bipap   History of Present Illness:  55 year old diabetic, hypertensive admitted 10/24 with STEMI.  He had recent admission 10/11 for chest pain and hypertensive urgency with normal LVEF on echo.  He underwent left heart cath which showed three-vessel disease, underwent PCI with DES to left circumflex followed by CABG x4 with radial harvest and maze procedure.  Intra-Op EF was noted to be 20%.  He was extubated postop day 1 but developed progressive shock requiring Levophed and vasopressin with AKI and oliguria.  He was placed on BiPAP for hypoxia, ABG showed metabolic acidosis for which she was given 3 A of bicarb and PCCM consulted Seen by heart failure service, coox 56% started on milrinone  Pertinent  Medical History  Diabetes Hypertension CKD stage II -Baseline creatinine 1.8 Paroxysmal atrial fibrillation Anxiety -on Xanax 2 mg thrice daily  Significant Hospital Events:   10/24 admitted with STEMI  10/27 PCCM consulted, due to respiratory issues intubated early morning   10/28 No acute events overnight, flipped back into NSR with less pressor requirement and decreased FIO2 this AM  Interim History / Subjective:  Remains sedated on vent with CRRT  No issues overnight   Objective   Blood pressure 117/87, pulse (!) 44, temperature (!) 97.5 F (36.4 C), resp. rate 20, height 6\' 2"  (1.88 m), weight 121.3 kg, SpO2 100 %. CVP:  [10 mmHg-22 mmHg] 11 mmHg  Vent Mode: PRVC FiO2 (%):  [40 %-80 %] 40 % Set Rate:  [20 bmp] 20 bmp Vt Set:  [550 mL] 550 mL PEEP:  [15 cmH20] 15 cmH20 Plateau Pressure:  [19 cmH20-26 cmH20] 21 cmH20   Intake/Output Summary (Last 24 hours) at 07/11/2021 0703 Last data filed at 07/11/2021 0700 Gross per 24 hour  Intake 3685.28 ml  Output 3739 ml  Net -53.72 ml     Filed Weights   07/09/21 0407 07/10/21 0500 07/11/21 0500  Weight: 120.8 kg 119.4 kg 121.3 kg    Examination: General: Acute on chronically ill appearing middle aged male lying in bed on mechanical ventilation, in NAD HEENT: ETT, MM pink/moist, PERRL,  Neuro: Sedated on vent  CV: s1s2 regular rate and rhythm, no murmur, rubs, or gallops,  PULM:  Slight rhonchi to bases, tolerating vent, no increased work of breathing, FIO@ now 40, PEEP 15 GI: soft, bowel sounds active in all 4 quadrants, non-tender, non-distended, tolerating trickle TF Extremities: warm/dry, no edema  Skin: no rashes or lesions  Resolved Hospital Problem list     Assessment & Plan:  Cardiogenic shock in the setting of anterior STEMI  CABG x4 with radial harvest Ischemic cardiomyopathy  -ECHO 06/25/2021 EF 50-55%. Intraoperative TEE 20% Paroxsymal A-fib -On carvedilol at baseline  P: Primary management per cardiology  Continue pressors for MAP goal > 65 Continue Amio drip  Off Milrinone  CVP improved at 9 Continuous telemetry  Anticoagulation plan per primary   Acute hypoxic respiratory failure  -related to acute systolic heart failure Concern for evolving aspiration PNA P: Continue ventilator support with lung protective strategies  Wean PEEP and FiO2 for sats greater than 90% Head of bed elevated 30 degrees. Plateau pressures less than 30 cm H20.  Follow intermittent chest x-ray and ABG.   SAT/SBT as tolerated, mentation preclude extubation  Ensure adequate pulmonary hygiene  Follow  cultures  Continue empiric antibiotics  VAP bundle in place  PAD protocol  Renal failure likely secondary to ATN superimposed on CKD stage IIb -ATN related to contrast and hypotension P: Nephrology consulted and started CRRT 10/27 Follow renal function  Monitor urine output Trend Bmet Avoid nephrotoxins Ensure adequate renal perfusion   Type 2 diabetes  -Home medications include tradjenta  P: Remains  on insulin drip per CTS CBG checks q1  At risk malnutrition  P: Trickle tube feeds started 10/27 advance per dietary  Supplement protein   Anemia  P: Trend CBC  Transfuse per protocol   Goals of care During daily update with patient's sisters they indicated that they are conflicted regarding increasing critical care needs.  They state that patient is very antimedications and anti-"science".  They feel as if patient would not want these aggressive interventions for very long.  They are willing to give a limited trial to aggressive interventions and attempts to prolong Daved's life but would not be interested in extended interventions including prolonged dialysis, tracheostomy, or PEG tube.  They also indicated they would be resistant to provide aggressive life-saving interventions if Leng were to further decompensate.    Clinical staff will continue to support family and patient during critical illness and engage with family for daily updates and plan of care.  Best Practice    Diet/type: NPO DVT prophylaxis: prophylactic heparin  GI prophylaxis: N/A Lines: Central line Foley:  Yes, and it is still needed Code Status:  full code Last date of multidisciplinary goals of care discussion [NA]  Critical care time:    Performed by: Kyliyah Stirn D. Harris  Total critical care time: 40  minutes  Critical care time was exclusive of separately billable procedures and treating other patients.  Critical care was necessary to treat or prevent imminent or life-threatening deterioration.  Critical care was time spent personally by me on the following activities: development of treatment plan with patient and/or surrogate as well as nursing, discussions with consultants, evaluation of patient's response to treatment, examination of patient, obtaining history from patient or surrogate, ordering and performing treatments and interventions, ordering and review of laboratory studies, ordering and review  of radiographic studies, pulse oximetry and re-evaluation of patient's condition.   Ruthetta Koopmann D. Kenton Kingfisher, NP-C [facility] Personal contact information can be found on Amion  07/11/2021, 7:03 AM

## 2021-07-11 NOTE — Progress Notes (Signed)
ANTICOAGULATION CONSULT NOTE - Initial Consult  Pharmacy Consult for heparin Indication: atrial fibrillation  No Known Allergies  Patient Measurements: Height: 6' 2" (188 cm) Weight: 121.3 kg (267 lb 6.7 oz) IBW/kg (Calculated) : 82.2 Heparin Dosing Weight: 142 kg  Vital Signs: Temp: 99 F (37.2 C) (10/28 1230) Temp Source: Bladder (10/28 1200) BP: 116/75 (10/28 1200) Pulse Rate: 92 (10/28 1130)  Labs: Recent Labs    07/08/21 1743 07/08/21 1746 07/09/21 2248 07/10/21 0026 07/10/21 0313 07/10/21 0409 07/10/21 0937 07/10/21 1555 07/11/21 0403 07/11/21 0413  HGB 10.5*   < > 9.3*   < > 8.5* 8.9*  --   --   --  8.4*  HCT 31.6*   < > 27.6*   < > 25.0* 25.8*  --   --   --  24.7*  PLT 147*   < > 234  --   --  213  --   --   --  148*  APTT 33  --   --   --   --   --   --   --   --   --   LABPROT 15.9*  --   --   --   --   --  15.0  --   --   --   INR 1.3*  --   --   --   --   --  1.2  --   --   --   CREATININE  --    < >  --   --   --  5.07*  --  5.52* 4.43*  --    < > = values in this interval not displayed.    Estimated Creatinine Clearance: 25.8 mL/min (A) (by C-G formula based on SCr of 4.43 mg/dL (H)).   Medical History: Past Medical History:  Diagnosis Date   Anxiety    Depression    Diabetic neuropathy ([place])    Diabetic retinopathy ([place])    visual impairment   Dysrhythmia    "palpatations sometimes" (06/30/2017)   Exertional dyspnea 06/30/2021   Family history of adverse reaction to anesthesia    sister had OR 2016; "couldn't wake up; could hear what they were saying but couldn't get their attention; like I was paralyzed but fully awake" (06/30/2017)   Pneumonia X 2   SVT (supraventricular tachycardia) ([place])    Toe ulcer ([place]) 06/30/2017   2nd digit   Type II diabetes mellitus ([place])     Medications:  Medications Prior to Admission  Medication Sig Dispense Refill Last Dose   alprazolam (XANAX) 2 MG tablet Take 1 tablet (2 mg total) by mouth 3 (three)  times daily as needed for anxiety. 2 tablet 0 Past Week   B-D ULTRAFINE III SHORT PEN 31G X 8 MM MISC USE TO ADMINISTER INSULIN DAILY E11..39  2 Past Week   diclofenac Sodium (VOLTAREN) 1 % GEL Apply topically as needed.   Past Month   gabapentin (NEURONTIN) 100 MG capsule Take 200 mg by mouth 2 (two) times daily. Per patient, takes all 4 capsules at Bedtime  5 Past Week   Lancets (FREESTYLE) lancets CHECK BLOOD SUGAR 3 TIMES DAILY  6 Past Week   linagliptin (TRADJENTA) 5 MG TABS tablet Take 5 mg by mouth daily. Reported on 09/09/2015   Past Week   MAGNESIUM CITRATE PO Take 1 scoop by mouth daily as needed.   Past Week   methocarbamol (ROBAXIN) 500 MG tablet Take 500 mg by  mouth 3 (three) times daily.   Past Week   Multiple Vitamin (MULTIVITAMIN WITH MINERALS) TABS tablet Take 1 tablet by mouth daily.   Past Week   ramipril (ALTACE) 10 MG capsule Take 20 mg by mouth daily.   Past Week   traZODone (DESYREL) 100 MG tablet Take 100 mg by mouth at bedtime. Per pt - takes 75 mg (splits tablet in half; and then quarters - then takes 3/4 of tablet nightly)   Past Week   TRESIBA FLEXTOUCH 100 UNIT/ML FlexTouch Pen Inject 0-30 Units into the skin. Inject 10-30 units subQ PRN. Checks CBG with Libre. Gives insulin if "sugar is high"   Past Week   VASCEPA 1 g capsule Take 2 g by mouth 2 (two) times daily.   Past Week   amLODipine (NORVASC) 5 MG tablet Take 1 tablet (5 mg total) by mouth daily. (Patient not taking: Reported on 07/09/2021) 30 tablet 0 Not Taking   carvedilol (COREG) 12.5 MG tablet Take 1 tablet (12.5 mg total) by mouth 2 (two) times daily. (Patient not taking: Reported on 07/09/2021) 180 tablet 3 Not Taking   Scheduled:   acetaminophen  1,000 mg Oral Q6H   Or   acetaminophen (TYLENOL) oral liquid 160 mg/5 mL  1,000 mg Per Tube Q6H   amiodarone  150 mg Intravenous Once   aspirin EC  81 mg Oral Daily   Or   aspirin  81 mg Per Tube Daily   atorvastatin  80 mg Oral Daily   B-complex with  vitamin C  1 tablet Per Tube Daily   bisacodyl  10 mg Oral Daily   Or   bisacodyl  10 mg Rectal Daily   chlorhexidine gluconate (MEDLINE KIT)  15 mL Mouth Rinse BID   Chlorhexidine Gluconate Cloth  6 each Topical Daily   clonazePAM  2 mg Per Tube BID   clopidogrel  75 mg Per Tube Daily   docusate  100 mg Per Tube BID   insulin aspart  2-6 Units Subcutaneous Q4H   insulin detemir  18 Units Subcutaneous Q12H   mouth rinse  15 mL Mouth Rinse 10 times per day   pantoprazole sodium  40 mg Per Tube Daily   polyethylene glycol  17 g Per Tube BID   sodium chloride flush  10-40 mL Intracatheter Q12H   sodium chloride flush  3 mL Intravenous Q12H   Infusions:    prismasol BGK 4/2.5 400 mL/hr at 07/11/21 0109    prismasol BGK 4/2.5 300 mL/hr at 07/11/21 0555   sodium chloride     sodium chloride     sodium chloride     albumin human 12.5 g (07/08/21 1827)   And   sodium chloride Stopped (07/08/21 2051)   amiodarone 60 mg/hr (07/11/21 1200)   ceFEPime (MAXIPIME) IV Stopped (07/11/21 0935)   dexmedetomidine (PRECEDEX) IV infusion 1.2 mcg/kg/hr (07/11/21 1233)   dextrose     feeding supplement (VITAL 1.5 CAL) 20 mL/hr at 07/10/21 2000   fentaNYL infusion INTRAVENOUS 200 mcg/hr (07/11/21 1200)   insulin 2.2 Units/hr (07/11/21 1200)   lactated ringers     lactated ringers 10 mL/hr at 07/11/21 1200   lactated ringers     midazolam 6 mg/hr (07/11/21 1200)   norepinephrine (LEVOPHED) Adult infusion 8 mcg/min (07/11/21 1200)   prismasol BGK 4/2.5 1,500 mL/hr at 07/11/21 0554   promethazine (PHENERGAN) injection (IM or IVPB) Stopped (07/09/21 1628)    Assessment: Patient is s/p CABG with MAZE procedure on  10/25. Has had recurrent afib previously resolved with amiodarone. Pharmacy has been consulted to start anticoagulation with heparin. Considering recent CABG, will plan to start with lower dose and titrate per levels. Hemoglobin 8.4 and platelets 148k.  Goal of Therapy:  Heparin level  0.3-0.5 Monitor platelets by anticoagulation protocol: Yes   Plan:  Start heparin at low dose of 1000 units/hr Check 8 hour heparin level Check daily heparin level and CBC  Thank you for allowing pharmacy to participate in this patient's care.  Reatha Harps, PharmD PGY1 Pharmacy Resident 07/11/2021 1:05 PM Check [email or web address] for unit specific pharmacy number

## 2021-07-11 NOTE — Progress Notes (Signed)
Nutrition Follow-up  DOCUMENTATION CODES:   Not applicable  INTERVENTION:   Initiate trickle tube feeds via OG tube: - Vital 1.5 @ 20 ml/hr (480 ml/day)  Trickle tube feeding regimen provides 720 kcal, 32 grams of protein, and 367 ml of H2O.    Recommend advance tube feeds to goal regimen: - Vital 1.5 @ 55 ml/hr (1320 ml/day) - ProSource TF 90 ml QID  Goal tube feeding regimen would provide 2300 kcal, 177 grams of protein, and 1008 ml of H2O.   - B-complex with vitamin C to account for losses with CRRT  NUTRITION DIAGNOSIS:   Increased nutrient needs related to acute illness (AKI requiring CRRT) as evidenced by estimated needs.  GOAL:   Patient will meet greater than or equal to 90% of their needs  MONITOR:   Vent status, Labs, Weight trends, TF tolerance, I & O's  REASON FOR ASSESSMENT:   Ventilator, Consult Enteral/tube feeding initiation and management  ASSESSMENT:   55 year old male who presented to the ED on 10/24 with STEMI. PMH of atrial fibrillation, HTN, T2DM, depression, anxiety, CKD stage II.  10/24 - s/p LHC with PCI, found to have severe multivessel CAD 10/25 - s/p CABG x 4, Maze 10/26 - extubated 10/27 - intubated, CRRT start  Per notes, pt was extubated post-op day 1 but developed progressive shock requiring levophed and vasopressin with AKI and oliguria. Pt placed on BiPAP for hypoxia and eventually required intubation yesterday. Pt now with cardiogenic shock in the setting of anterior STEMI and AKI on CKD stage IIIb.   Per RN cardiothoracic surgery and CCM wish to continue trickle TF for now with no advancement.  Pt pulled out HD cath this am. CRRT to resume after replacement.   Admit weight: 115.7 kg Current weight: 121.3 kg  Patient is currently intubated on ventilator support MV: 10.5 L/min Temp (24hrs), Avg:97.9 F (36.6 C), Min:96.1 F (35.6 C), Max:99.1 F (37.3 C) BP (a-line): 120/58 MAP (a-line): 74 40%  fiO2  Drips: Precedex Fentanyl Amiodarone Versed Levophed @ 10 mcg/min Vasopressin off Insulin: 4.8 units/hr  Medications reviewed and include: B-complex with C, dulcolax, colace, novolog, levemir 18 units BID, protonix, miralax, IV abx, IV lasix  Labs reviewed:  CBG's: 131-171  UOP: 115 ml x 24 hours UF: 3793 ml  CT: 160 ml x 24 hours I/O's: +6.3 L since admit  14 F OG tube  Diet Order:   Diet Order             Diet NPO time specified  Diet effective now                   EDUCATION NEEDS:   Not appropriate for education at this time  Skin:  Skin Assessment: Skin Integrity Issues: Incisions: chest, L leg, R arm  Last BM:  no documented BM  Height:   Ht Readings from Last 1 Encounters:  07/08/21 6\' 2"  (1.88 m)    Weight:   Wt Readings from Last 1 Encounters:  07/11/21 121.3 kg    Ideal Body Weight:  86.4 kg  BMI:  Body mass index is 34.33 kg/m.  Estimated Nutritional Needs:   Kcal:  2100-2300  Protein:  170-190 grams  Fluid:  >/= 2.0 L  Arriyah Madej P., RD, LDN, CNSC See AMiON for contact information

## 2021-07-12 ENCOUNTER — Inpatient Hospital Stay (HOSPITAL_COMMUNITY): Payer: Medicare HMO

## 2021-07-12 DIAGNOSIS — R570 Cardiogenic shock: Secondary | ICD-10-CM | POA: Diagnosis not present

## 2021-07-12 DIAGNOSIS — J9601 Acute respiratory failure with hypoxia: Secondary | ICD-10-CM | POA: Diagnosis not present

## 2021-07-12 DIAGNOSIS — N179 Acute kidney failure, unspecified: Secondary | ICD-10-CM | POA: Diagnosis not present

## 2021-07-12 DIAGNOSIS — Z951 Presence of aortocoronary bypass graft: Secondary | ICD-10-CM

## 2021-07-12 DIAGNOSIS — I2109 ST elevation (STEMI) myocardial infarction involving other coronary artery of anterior wall: Secondary | ICD-10-CM | POA: Diagnosis not present

## 2021-07-12 LAB — BPAM RBC
Blood Product Expiration Date: 202211022359
Blood Product Expiration Date: 202211022359
Unit Type and Rh: 9500
Unit Type and Rh: 9500

## 2021-07-12 LAB — TYPE AND SCREEN
ABO/RH(D): O NEG
Antibody Screen: NEGATIVE
Unit division: 0
Unit division: 0

## 2021-07-12 LAB — RENAL FUNCTION PANEL
Albumin: 1.9 g/dL — ABNORMAL LOW (ref 3.5–5.0)
Albumin: 1.9 g/dL — ABNORMAL LOW (ref 3.5–5.0)
Anion gap: 8 (ref 5–15)
Anion gap: 6 (ref 5–15)
BUN: 25 mg/dL — ABNORMAL HIGH (ref 6–20)
BUN: 25 mg/dL — ABNORMAL HIGH (ref 6–20)
CO2: 24 mmol/L (ref 22–32)
CO2: 26 mmol/L (ref 22–32)
Calcium: 7.9 mg/dL — ABNORMAL LOW (ref 8.9–10.3)
Calcium: 7.7 mg/dL — ABNORMAL LOW (ref 8.9–10.3)
Chloride: 103 mmol/L (ref 98–111)
Chloride: 104 mmol/L (ref 98–111)
Creatinine, Ser: 3.94 mg/dL — ABNORMAL HIGH (ref 0.61–1.24)
Creatinine, Ser: 3.73 mg/dL — ABNORMAL HIGH (ref 0.61–1.24)
GFR, Estimated: 17 mL/min — ABNORMAL LOW (ref >60)
GFR, Estimated: 18 mL/min — ABNORMAL LOW (ref >60)
Glucose, Bld: 197 mg/dL — ABNORMAL HIGH (ref 70–99)
Glucose, Bld: 201 mg/dL — ABNORMAL HIGH (ref 70–99)
Phosphorus: 3 mg/dL (ref 2.5–4.6)
Phosphorus: 2.7 mg/dL (ref 2.5–4.6)
Potassium: 4.6 mmol/L (ref 3.5–5.1)
Potassium: 4.6 mmol/L (ref 3.5–5.1)
Sodium: 135 mmol/L (ref 135–145)
Sodium: 136 mmol/L (ref 135–145)

## 2021-07-12 LAB — CBC
HCT: 27 % — ABNORMAL LOW (ref 39.0–52.0)
Hemoglobin: 9 g/dL — ABNORMAL LOW (ref 13.0–17.0)
MCH: 31.6 pg (ref 26.0–34.0)
MCHC: 33.3 g/dL (ref 30.0–36.0)
MCV: 94.7 fL (ref 80.0–100.0)
Platelets: 196 10*3/uL (ref 150–400)
RBC: 2.85 MIL/uL — ABNORMAL LOW (ref 4.22–5.81)
RDW: 13 % (ref 11.5–15.5)
WBC: 11 10*3/uL — ABNORMAL HIGH (ref 4.0–10.5)
nRBC: 0.3 % — ABNORMAL HIGH (ref 0.0–0.2)

## 2021-07-12 LAB — GLUCOSE, CAPILLARY
Glucose-Capillary: 196 mg/dL — ABNORMAL HIGH (ref 70–99)
Glucose-Capillary: 138 mg/dL — ABNORMAL HIGH (ref 70–99)
Glucose-Capillary: 210 mg/dL — ABNORMAL HIGH (ref 70–99)
Glucose-Capillary: 156 mg/dL — ABNORMAL HIGH (ref 70–99)
Glucose-Capillary: 109 mg/dL — ABNORMAL HIGH (ref 70–99)
Glucose-Capillary: 143 mg/dL — ABNORMAL HIGH (ref 70–99)

## 2021-07-12 LAB — COOXEMETRY PANEL
Carboxyhemoglobin: 1.1 % (ref 0.5–1.5)
Carboxyhemoglobin: 0.7 % (ref 0.5–1.5)
Methemoglobin: 1 % (ref 0.0–1.5)
Methemoglobin: 0.9 % (ref 0.0–1.5)
O2 Saturation: 52.8 %
O2 Saturation: 54.2 %
Total hemoglobin: 9.1 g/dL — ABNORMAL LOW (ref 12.0–16.0)
Total hemoglobin: 12.6 g/dL (ref 12.0–16.0)

## 2021-07-12 LAB — CULTURE, RESPIRATORY W GRAM STAIN: Culture: NORMAL

## 2021-07-12 LAB — HEPARIN LEVEL (UNFRACTIONATED)
Heparin Unfractionated: 0.12 IU/mL — ABNORMAL LOW (ref 0.30–0.70)
Heparin Unfractionated: 0.24 IU/mL — ABNORMAL LOW (ref 0.30–0.70)

## 2021-07-12 LAB — MAGNESIUM: Magnesium: 2.5 mg/dL — ABNORMAL HIGH (ref 1.7–2.4)

## 2021-07-12 MED ORDER — INSULIN ASPART 100 UNIT/ML IJ SOLN: 3.0000 [IU] | INTRAMUSCULAR | Status: DC

## 2021-07-12 MED ORDER — ADULT MULTIVITAMIN LIQUID CH
15.0000 mL | Freq: Every day | ORAL | Status: DC
  Administered 2021-07-12 – 2021-07-13 (×2): 15 mL
  Filled 2021-07-12 (×4): qty 15

## 2021-07-12 MED ORDER — METOCLOPRAMIDE HCL 5 MG/ML IJ SOLN
5.0000 mg | Freq: Three times a day (TID) | INTRAMUSCULAR | Status: DC
  Administered 2021-07-12 – 2021-07-16 (×12): 5 mg via INTRAVENOUS
  Filled 2021-07-12 (×11): qty 2

## 2021-07-12 MED ORDER — SORBITOL 70 % SOLN
30.0000 mL | Freq: Once | Status: AC
  Administered 2021-07-12: 30 mL
  Filled 2021-07-12: qty 30

## 2021-07-12 MED ORDER — INSULIN ASPART 100 UNIT/ML IJ SOLN
3.0000 [IU] | INTRAMUSCULAR | Status: DC
  Administered 2021-07-12: 3 [IU] via SUBCUTANEOUS
  Administered 2021-07-12: 6 [IU] via SUBCUTANEOUS
  Administered 2021-07-12: 9 [IU] via SUBCUTANEOUS
  Administered 2021-07-13: 3 [IU] via SUBCUTANEOUS
  Administered 2021-07-13 – 2021-07-14 (×6): 6 [IU] via SUBCUTANEOUS
  Administered 2021-07-14: 9 [IU] via SUBCUTANEOUS
  Administered 2021-07-14 (×3): 6 [IU] via SUBCUTANEOUS
  Administered 2021-07-15 – 2021-07-16 (×7): 9 [IU] via SUBCUTANEOUS
  Administered 2021-07-16: 6 [IU] via SUBCUTANEOUS
  Administered 2021-07-16 (×2): 9 [IU] via SUBCUTANEOUS
  Administered 2021-07-16: 6 [IU] via SUBCUTANEOUS
  Administered 2021-07-17 (×2): 9 [IU] via SUBCUTANEOUS
  Administered 2021-07-17 (×2): 6 [IU] via SUBCUTANEOUS
  Administered 2021-07-17: 9 [IU] via SUBCUTANEOUS
  Administered 2021-07-18: 3 [IU] via SUBCUTANEOUS
  Administered 2021-07-18: 9 [IU] via SUBCUTANEOUS
  Administered 2021-07-18 (×2): 6 [IU] via SUBCUTANEOUS
  Administered 2021-07-19: 3 [IU] via SUBCUTANEOUS
  Administered 2021-07-19 – 2021-07-20 (×4): 6 [IU] via SUBCUTANEOUS
  Administered 2021-07-21: 3 [IU] via SUBCUTANEOUS

## 2021-07-12 MED ORDER — SENNOSIDES 8.8 MG/5ML PO SYRP
10.0000 mL | ORAL_SOLUTION | Freq: Two times a day (BID) | ORAL | Status: DC
  Administered 2021-07-12 – 2021-07-13 (×4): 10 mL
  Filled 2021-07-12 (×4): qty 10

## 2021-07-12 MED ORDER — INSULIN ASPART 100 UNIT/ML IJ SOLN
3.0000 [IU] | INTRAMUSCULAR | Status: DC
  Administered 2021-07-12 – 2021-07-16 (×18): 3 [IU] via SUBCUTANEOUS

## 2021-07-12 NOTE — Progress Notes (Signed)
NAME:  Billy [name], MRN:  [id number], DOB:  07/31/1966, LOS: 5 ADMISSION DATE:  07/07/2021, CONSULTATION DATE:  07/12/2021  REFERRING MD:  Lynnette Caffey, CHIEF COMPLAINT:  cardiogenic shock , on bipap   History of Present Illness:  55 year old diabetic, hypertensive admitted 10/24 with STEMI.  He had recent admission 10/11 for chest pain and hypertensive urgency with normal LVEF on echo.  He underwent left heart cath which showed three-vessel disease, underwent PCI with DES to left circumflex followed by CABG x4 with radial harvest and maze procedure.  Intra-Op EF was noted to be 20%.  He was extubated postop day 1 but developed progressive shock requiring Levophed and vasopressin with AKI and oliguria.  He was placed on BiPAP for hypoxia, ABG showed metabolic acidosis for which she was given 3 A of bicarb and PCCM consulted Seen by heart failure service, coox 56% started on milrinone  Pertinent  Medical History  Diabetes Hypertension CKD stage II -Baseline creatinine 1.8 Paroxysmal atrial fibrillation Anxiety -on Xanax 2 mg thrice daily  Significant Hospital Events:   10/24 admitted with STEMI  10/27 PCCM consulted, due to respiratory issues intubated early morning   10/28 No acute events overnight, flipped back into NSR with less pressor requirement and decreased FIO2 this AM  Interim History / Subjective:  No acute events overnight. Dialysis catheter replaced yesterday. Still no BM.  Objective   Blood pressure 119/61, pulse 64, temperature 98.2 F (36.8 C), resp. rate 16, height 6\' 2"  (1.88 m), weight 121.3 kg, SpO2 100 %. CVP:  [7 mmHg-16 mmHg] 9 mmHg  Vent Mode: PRVC FiO2 (%):  [40 %] 40 % Set Rate:  [20 bmp] 20 bmp Vt Set:  [550 mL] 550 mL PEEP:  [5 cmH20-8 cmH20] 5 cmH20 Plateau Pressure:  [10 cmH20-18 cmH20] 10 cmH20   Intake/Output Summary (Last 24 hours) at 07/12/2021 0828 Last data filed at 07/12/2021 0800 Gross per 24 hour  Intake 3958.86 ml  Output 4399 ml   Net -440.14 ml    Filed Weights   07/09/21 0407 07/10/21 0500 07/11/21 0500  Weight: 120.8 kg 119.4 kg 121.3 kg    Examination: General: critically ill appearing man lying in bed intubated, sedated HEENT: [place]/AT, eyes anicteric, ETT in place.  Neuro: RASS -5, winces and light cough with tracheal suctioning. PERRL. CV: S1S2, RRR, NSR on tele. PULM:  distant lung sounds, CTAB. Minimal clear secretions from ETT. GI: obese, soft, hypoactive bowel sounds Extremities: No cyanosis, ++edema. R radial Aline with normal distal perfusion, LIJ HD catheter. Skin: no rashes or wounds, pallor  I/O -580cc, net +5.7L for admission  Coox 53% WBC 11 H/H 9/27 10/27 respiratory culture> abundant PMNs, rare GPC, GNR, GPR  Resolved Hospital Problem list     Assessment & Plan:  Cardiogenic shock in the setting of anterior STEMI  CABG x4 with radial harvest Ischemic cardiomyopathy  -ECHO 06/25/2021 EF 50-55%. Intraoperative TEE 20% Paroxsymal A-fib- back in NSR today -On carvedilol at baseline  P: -con't norepi to maintain SBP>100 -serial cooximetry -aggressive volume removal -con't flotrak -con't amiodarone infusion; has received multiple boluses this admission. -holding GDMT until off pressors -DAPT, heparin infusion -tele  Acute hypoxic respiratory failure due to acute pulmonary edema; previously extubated, reintubated on 10/27 Concern for evolving aspiration PNA P: -LTVV, 4-8cc/kg IBW with goal Pplat<30 and DP<15 -VAP prevention protocol -PAD protocol for sedation -wean FiO2 as able to maintain SpO2>90% -con't empiric antibiotics; follow culture until finalized -deep sedation due to ongoing need  to pull volume and previous life-threatening agitation  Chronic benzo use, anxiety, severe agitation -scheduled clonazepam -versed infusion added to sedation  Oliguric renal failure likely secondary to ATN superimposed on CKD stage IIb -ATN related to contrast and  hypotension P: -Cont CRRT -aggressive volume removal -foley out, bladder scan q shift -con't to monitor  Type 2 diabetes  with hyperglycemia -Home medications include tradjenta  P: -con't levemir -adding TF coverage with hold parameters -SSI PRN -goal BG 140-180  Constipation -adding reglan -additional sorbitol today -miralax + colace + senna BID -dulcolax daily  At risk malnutrition  P: -trickle TF, not advancing until off pressors -vitamins and protein supplements  Acute anemia  P: -transfuse for Hb<7 or hemodynamically significant bleeding     Best Practice    Diet/type: tubefeeds DVT prophylaxis: systemic heparin GI prophylaxis: PPI Lines: Central line Foley:  removal ordered  Code Status:  full code Last date of multidisciplinary goals of care discussion [  ]  Critical care time:    This patient is critically ill with multiple organ system failure which requires frequent high complexity decision making, assessment, support, evaluation, and titration of therapies. This was completed through the application of advanced monitoring technologies and extensive interpretation of multiple databases. During this encounter critical care time was devoted to patient care services described in this note for 50 minutes.  Julian Hy, DO 07/12/21 9:19 AM [facility]

## 2021-07-12 NOTE — Progress Notes (Signed)
ANTICOAGULATION CONSULT NOTE - Follow Up Consult  Pharmacy Consult for heparin Indication: atrial fibrillation  Labs: Recent Labs    07/10/21 0409 07/10/21 0937 07/10/21 1555 07/11/21 0403 07/11/21 0413 07/11/21 1602 07/11/21 2031 07/11/21 2035 07/12/21 0426  HGB 8.9*  --   --   --  8.4*  --   --  8.5* 9.0*  HCT 25.8*  --   --   --  24.7*  --   --  25.0* 27.0*  PLT 213  --   --   --  148*  --   --   --  196  LABPROT  --  15.0  --   --   --   --   --   --   --   INR  --  1.2  --   --   --   --   --   --   --   HEPARINUNFRC  --   --   --   --   --   --  0.13*  --  0.12*  CREATININE 5.07*  --    < > 4.43*  --  4.67*  --   --  3.94*   < > = values in this interval not displayed.    Assessment: 54yo male subtherapeutic on heparin with no change in heparin level despite rate increase; no infusion issues or signs of bleeding per RN, slow titration after recent CABG w/ MAZE.  Goal of Therapy:  Heparin level 0.3-0.5 units/ml   Plan:  Will increase heparin infusion by 2-3 units/kg/hr to 1500 units/hr and check level in 8 hours.    Wynona Neat, PharmD, BCPS  07/12/2021,6:25 AM

## 2021-07-12 NOTE — Progress Notes (Signed)
CPT held pt has increased agitation. RT will cont to monitor. Currently clear breath sounds and sat 100%

## 2021-07-12 NOTE — Progress Notes (Signed)
[place] for heparin Indication: atrial fibrillation  No Known Allergies  Patient Measurements: Height: 6' 2" (188 cm) Weight: 121.3 kg (267 lb 6.7 oz) IBW/kg (Calculated) : 82.2 Heparin Dosing Weight: 142 kg  Vital Signs: Temp: 98.2 F (36.8 C) (10/29 1116) Temp Source: Oral (10/29 1116) BP: 107/62 (10/29 1536) Pulse Rate: 69 (10/29 1536)  Labs: Recent Labs    07/10/21 0409 07/10/21 0258 07/10/21 1555 07/11/21 0413 07/11/21 1602 07/11/21 2031 07/11/21 2035 07/12/21 0426 07/12/21 1446  HGB 8.9*  --   --  8.4*  --   --  8.5* 9.0*  --   HCT 25.8*  --   --  24.7*  --   --  25.0* 27.0*  --   PLT 213  --   --  148*  --   --   --  196  --   LABPROT  --  15.0  --   --   --   --   --   --   --   INR  --  1.2  --   --   --   --   --   --   --   HEPARINUNFRC  --   --   --   --   --  0.13*  --  0.12* 0.24*  CREATININE 5.07*  --    < >  --  4.67*  --   --  3.94* 3.73*   < > = values in this interval not displayed.     Estimated Creatinine Clearance: 30.6 mL/min (A) (by C-G formula based on SCr of 3.73 mg/dL (H)).   Medical History: Past Medical History:  Diagnosis Date   Anxiety    Depression    Diabetic neuropathy ([place])    Diabetic retinopathy ([place])    visual impairment   Dysrhythmia    "palpatations sometimes" (06/30/2017)   Exertional dyspnea 06/30/2021   Family history of adverse reaction to anesthesia    sister had OR 2016; "couldn't wake up; could hear what they were saying but couldn't get their attention; like I was paralyzed but fully awake" (06/30/2017)   Pneumonia X 2   SVT (supraventricular tachycardia) ([place])    Toe ulcer ([place]) 06/30/2017   2nd digit   Type II diabetes mellitus ([place])     Medications:  Medications Prior to Admission  Medication Sig Dispense Refill Last Dose   alprazolam (XANAX) 2 MG tablet Take 1 tablet (2 mg total) by mouth 3 (three) times daily as needed for anxiety. 2 tablet 0 Past Week    B-D ULTRAFINE III SHORT PEN 31G X 8 MM MISC USE TO ADMINISTER INSULIN DAILY E11..39  2 Past Week   diclofenac Sodium (VOLTAREN) 1 % GEL Apply topically as needed.   Past Month   gabapentin (NEURONTIN) 100 MG capsule Take 200 mg by mouth 2 (two) times daily. Per patient, takes all 4 capsules at Bedtime  5 Past Week   Lancets (FREESTYLE) lancets CHECK BLOOD SUGAR 3 TIMES DAILY  6 Past Week   linagliptin (TRADJENTA) 5 MG TABS tablet Take 5 mg by mouth daily. Reported on 09/09/2015   Past Week   MAGNESIUM CITRATE PO Take 1 scoop by mouth daily as needed.   Past Week   methocarbamol (ROBAXIN) 500 MG tablet Take 500 mg by mouth 3 (three) times daily.   Past Week   Multiple Vitamin (MULTIVITAMIN WITH MINERALS) TABS tablet Take 1 tablet by mouth daily.  Past Week   ramipril (ALTACE) 10 MG capsule Take 20 mg by mouth daily.   Past Week   traZODone (DESYREL) 100 MG tablet Take 100 mg by mouth at bedtime. Per pt - takes 75 mg (splits tablet in half; and then quarters - then takes 3/4 of tablet nightly)   Past Week   TRESIBA FLEXTOUCH 100 UNIT/ML FlexTouch Pen Inject 0-30 Units into the skin. Inject 10-30 units subQ PRN. Checks CBG with Libre. Gives insulin if "sugar is high"   Past Week   VASCEPA 1 g capsule Take 2 g by mouth 2 (two) times daily.   Past Week   amLODipine (NORVASC) 5 MG tablet Take 1 tablet (5 mg total) by mouth daily. (Patient not taking: Reported on 07/09/2021) 30 tablet 0 Not Taking   carvedilol (COREG) 12.5 MG tablet Take 1 tablet (12.5 mg total) by mouth 2 (two) times daily. (Patient not taking: Reported on 07/09/2021) 180 tablet 3 Not Taking   Scheduled:   acetaminophen  1,000 mg Oral Q6H   Or   acetaminophen (TYLENOL) oral liquid 160 mg/5 mL  1,000 mg Per Tube Q6H   aspirin EC  81 mg Oral Daily   Or   aspirin  81 mg Per Tube Daily   atorvastatin  80 mg Oral Daily   B-complex with vitamin C  1 tablet Per Tube Daily   bisacodyl  10 mg Oral Daily   Or   bisacodyl  10 mg Rectal  Daily   chlorhexidine gluconate (MEDLINE KIT)  15 mL Mouth Rinse BID   Chlorhexidine Gluconate Cloth  6 each Topical Daily   clonazePAM  2 mg Per Tube BID   clopidogrel  75 mg Per Tube Daily   docusate  100 mg Per Tube BID   insulin aspart  3 Units Subcutaneous Q4H   insulin aspart  3-9 Units Subcutaneous Q4H   mouth rinse  15 mL Mouth Rinse 10 times per day   metoCLOPramide (REGLAN) injection  5 mg Intravenous Q8H   multivitamin  15 mL Per Tube Daily   pantoprazole sodium  40 mg Per Tube Daily   polyethylene glycol  17 g Per Tube BID   sennosides  10 mL Per Tube BID   sodium chloride flush  10-40 mL Intracatheter Q12H   sodium chloride flush  3 mL Intravenous Q12H   Infusions:    prismasol BGK 4/2.5 400 mL/hr at 07/12/21 0439    prismasol BGK 4/2.5 300 mL/hr at 07/12/21 0439   sodium chloride     sodium chloride 10 mL/hr at 07/12/21 1500   sodium chloride     albumin human 12.5 g (07/08/21 1827)   And   sodium chloride Stopped (07/08/21 2051)   amiodarone 30 mg/hr (07/12/21 1500)   ceFEPime (MAXIPIME) IV Stopped (07/12/21 1037)   dexmedetomidine (PRECEDEX) IV infusion 0.8 mcg/kg/hr (07/12/21 1531)   feeding supplement (VITAL 1.5 CAL) 20 mL/hr at 07/12/21 0850   fentaNYL infusion INTRAVENOUS 200 mcg/hr (07/12/21 1500)   heparin 1,500 Units/hr (07/12/21 1500)   lactated ringers     lactated ringers 20 mL/hr at 07/12/21 1500   lactated ringers     midazolam 8 mg/hr (07/12/21 1500)   norepinephrine (LEVOPHED) Adult infusion 12 mcg/min (07/12/21 1500)   prismasol BGK 4/2.5 1,500 mL/hr at 07/12/21 1237   promethazine (PHENERGAN) injection (IM or IVPB) Stopped (07/09/21 1628)    Assessment: Patient is s/p CABG with MAZE procedure on 10/25. Has had recurrent afib previously resolved with  amiodarone. Pharmacy has been consulted to start anticoagulation with heparin. Considering recent CABG, will plan to start with lower dose and titrate slowly as patient still has EPW in  place. Hemoglobin 8.4 and platelets 148k.  Heparin level this am low end of goal 0.25 on heparin drip 1300 units/hr. No s/sx of bleeding or infusion issues.  Now back in SR  Goal of Therapy:  Heparin level 0.3-0.5 Monitor platelets by anticoagulation protocol: Yes   Plan:  Continue heparin infusion to 1300 units/hr Check daily heparin level and CBC  .   Bonnita Nasuti Pharm.D. CPP, BCPS Clinical Pharmacist [phone] 07/12/2021 3:59 PM   Please check AMION for all [place] phone numbers After 10:00 PM, call [place] [phone]

## 2021-07-12 NOTE — Progress Notes (Signed)
Patient ID: Billy [name], male   DOB: 11/25/1965, 55 y.o.   MRN: [id number]     Advanced Heart Failure Rounding Note  PCP-Cardiologist: None   Subjective:    10/25: S/P CABG x4 with MAZE . Intra Op EF 20%.  10/27: Intubated. Hypotensive. Placed on Norepi+ Vaso. Recurrent A fib with amio increased to 60 mg/hr. Remains on CRRT   He is in NSR today.  Currently on NE 8, off vasopressin.   Remains intubated, asleep this morning.   CVVH ongoing, pulling 50-100 cc/hr net negative.    FloTrac CI 2.2, co-ox 53%.  CVP 9-10.    Objective:   Weight Range: 121.3 kg Body mass index is 34.33 kg/m.   Vital Signs:   Temp:  [97.5 F (36.4 C)-99.9 F (37.7 C)] 98.2 F (36.8 C) (10/29 0500) Pulse Rate:  [40-102] 64 (10/29 0500) Resp:  [0-21] 16 (10/29 0500) BP: (87-136)/(48-78) 119/61 (10/29 0441) SpO2:  [99 %-100 %] 100 % (10/29 0500) Arterial Line BP: (90-161)/(49-116) 124/65 (10/29 0500) FiO2 (%):  [40 %] 40 % (10/29 0441) Last BM Date:  (PTA)  Weight change: Filed Weights   07/09/21 0407 07/10/21 0500 07/11/21 0500  Weight: 120.8 kg 119.4 kg 121.3 kg    Intake/Output:   Intake/Output Summary (Last 24 hours) at 07/12/2021 0809 Last data filed at 07/12/2021 0600 Gross per 24 hour  Intake 3673.84 ml  Output 4166 ml  Net -492.16 ml      Physical Exam   General: Intubated/sedated.  Neck: JVP 10 cm, no thyromegaly or thyroid nodule.  Lungs: Decreased at bases.  CV: Nondisplaced PMI.  Heart regular S1/S2, no S3/S4, no murmur.  1+ ankle edema.  Abdomen: Soft, nontender, no hepatosplenomegaly, no distention.  Skin: Intact without lesions or rashes.  Neurologic: Sedated on vent.  Extremities: No clubbing or cyanosis.  HEENT: Normal.   Telemetry   NSR 60s, personally reviewed.   EKG    N/A   Labs    CBC Recent Labs    07/11/21 0413 07/11/21 2035 07/12/21 0426  WBC 13.0*  --  11.0*  NEUTROABS 10.3*  --   --   HGB 8.4* 8.5* 9.0*  HCT 24.7* 25.0* 27.0*  MCV  94.3  --  94.7  PLT 148*  --  432   Basic Metabolic Panel Recent Labs    07/11/21 1602 07/11/21 2035 07/12/21 0426  NA 131* 136 135  K 4.7 4.4 4.6  CL 98  --  103  CO2 24  --  24  GLUCOSE 239*  --  197*  BUN 30*  --  25*  CREATININE 4.67*  --  3.94*  CALCIUM 7.2*  --  7.9*  MG 2.4  --  2.5*  PHOS 4.2  --  3.0   Liver Function Tests Recent Labs    07/10/21 0937 07/10/21 1555 07/11/21 1602 07/12/21 0426  AST 29  --   --   --   ALT 5  --   --   --   ALKPHOS 40  --   --   --   BILITOT 0.6  --   --   --   PROT 5.2*  --   --   --   ALBUMIN 2.3*   < > 2.0* 1.9*   < > = values in this interval not displayed.   No results for input(s): LIPASE, AMYLASE in the last 72 hours. Cardiac Enzymes No results for input(s): CKTOTAL, CKMB, CKMBINDEX, TROPONINI in the last  72 hours.  BNP: BNP (last 3 results) No results for input(s): BNP in the last 8760 hours.  ProBNP (last 3 results) No results for input(s): PROBNP in the last 8760 hours.   D-Dimer No results for input(s): DDIMER in the last 72 hours. Hemoglobin A1C No results for input(s): HGBA1C in the last 72 hours.  Fasting Lipid Panel No results for input(s): CHOL, HDL, LDLCALC, TRIG, CHOLHDL, LDLDIRECT in the last 72 hours.  Thyroid Function Tests No results for input(s): TSH, T4TOTAL, T3FREE, THYROIDAB in the last 72 hours.  Invalid input(s): FREET3  Other results:   Imaging    DG Chest 1 View  Result Date: 07/11/2021 CLINICAL DATA:  Central line placement. EXAM: CHEST  1 VIEW COMPARISON:  05/11/2021 FINDINGS: 1433 hours. The cardio pericardial silhouette is enlarged. Endotracheal tube tip is 5.2 cm above the base of the carina. Right IJ central line tip overlies the innominate vein confluence. Left IJ central line tip also positioned at the innominate vein confluence. NG tube tip is in the proximal stomach with side port just below the level of the GE junction. Bibasilar atelectasis noted, left greater than  right. Left chest tube remains in place without evidence for left-sided pneumothorax. Probable midline mediastinal/pericardial drain. IMPRESSION: Cardiomegaly with left greater than right basilar atelectasis. No pneumothorax. Support apparatus as above. Electronically Signed   By: Misty Stanley M.D.   On: 07/11/2021 14:40   ECHOCARDIOGRAM LIMITED  Result Date: 07/11/2021    ECHOCARDIOGRAM LIMITED REPORT   Patient Name:   Billy [name] Date of Exam: 07/11/2021 Medical Rec #:  [id number]      Height:       74.0 in Accession #:    [id number]     Weight:       267.4 lb Date of Birth:  Dec 21, 1965      BSA:          2.459 m Patient Age:    41 years       BP:           109/55 mmHg Patient Gender: M              HR:           83 bpm. Exam Location:  Inpatient Procedure: Limited Echo and Intracardiac Opacification Agent Indications:    Evaluate EF at normal heart rate  History:        Patient has prior history of Echocardiogram examinations. CAD                 and Previous Myocardial Infarction, Prior CABG and Abnormal ECG,                 Arrythmias:SVT, Signs/Symptoms:Shortness of Breath and Chest                 Pain; Risk Factors:Hypertension and Diabetes. Cardiac shock.  Sonographer:    Merrie Roof RDCS Referring Phys: [place]  1. Left ventricular ejection fraction, by estimation, is 20 to 25%. The left ventricle has severely decreased function. The left ventricle demonstrates regional wall motion abnormalities (see scoring diagram/findings for description). There is severe akinesis of the left ventricular, mid-apical anteroseptal wall and apical segment. FINDINGS  Left Ventricle: Left ventricular ejection fraction, by estimation, is 20 to 25%. The left ventricle has severely decreased function. The left ventricle demonstrates regional wall motion abnormalities. Severe akinesis of the left ventricular, mid-apical anteroseptal wall and apical segment. Definity contrast agent was given  IV to  delineate the left ventricular endocardial borders.  LV Volumes (MOD) LV vol d, MOD A2C: 51.2 ml LV vol d, MOD A4C: 96.2 ml LV vol s, MOD A2C: 31.3 ml LV vol s, MOD A4C: 49.5 ml LV SV MOD A2C:     19.9 ml LV SV MOD A4C:     96.2 ml LV SV MOD BP:      26.8 ml Mertie Moores MD Electronically signed by Mertie Moores MD Signature Date/Time: 07/11/2021/6:02:39 PM    Final      Medications:     Scheduled Medications:  acetaminophen  1,000 mg Oral Q6H   Or   acetaminophen (TYLENOL) oral liquid 160 mg/5 mL  1,000 mg Per Tube Q6H   aspirin EC  81 mg Oral Daily   Or   aspirin  81 mg Per Tube Daily   atorvastatin  80 mg Oral Daily   B-complex with vitamin C  1 tablet Per Tube Daily   bisacodyl  10 mg Oral Daily   Or   bisacodyl  10 mg Rectal Daily   chlorhexidine gluconate (MEDLINE KIT)  15 mL Mouth Rinse BID   Chlorhexidine Gluconate Cloth  6 each Topical Daily   clonazePAM  2 mg Per Tube BID   clopidogrel  75 mg Per Tube Daily   docusate  100 mg Per Tube BID   insulin aspart  2-6 Units Subcutaneous Q4H   insulin detemir  18 Units Subcutaneous Q12H   mouth rinse  15 mL Mouth Rinse 10 times per day   pantoprazole sodium  40 mg Per Tube Daily   polyethylene glycol  17 g Per Tube BID   sodium chloride flush  10-40 mL Intracatheter Q12H   sodium chloride flush  3 mL Intravenous Q12H    Infusions:   prismasol BGK 4/2.5 400 mL/hr at 07/12/21 0439    prismasol BGK 4/2.5 300 mL/hr at 07/12/21 0439   sodium chloride     sodium chloride     sodium chloride     albumin human 12.5 g (07/08/21 1827)   And   sodium chloride Stopped (07/08/21 2051)   amiodarone 60 mg/hr (07/12/21 0600)   ceFEPime (MAXIPIME) IV Stopped (07/11/21 2140)   dexmedetomidine (PRECEDEX) IV infusion 1 mcg/kg/hr (07/12/21 0600)   dextrose     feeding supplement (VITAL 1.5 CAL) 20 mL/hr at 07/10/21 2000   fentaNYL infusion INTRAVENOUS 200 mcg/hr (07/12/21 0600)   heparin 1,500 Units/hr (07/12/21 0629)   insulin Stopped  (07/11/21 1302)   lactated ringers     lactated ringers 30 mL/hr at 07/12/21 0600   lactated ringers     midazolam 8 mg/hr (07/12/21 0600)   norepinephrine (LEVOPHED) Adult infusion 8 mcg/min (07/12/21 0612)   prismasol BGK 4/2.5 1,500 mL/hr at 07/12/21 0439   promethazine (PHENERGAN) injection (IM or IVPB) Stopped (07/09/21 1628)    PRN Medications: sodium chloride, albumin human **AND** sodium chloride, dextrose, dextrose, fentaNYL, heparin, heparin, lactated ringers, metoprolol tartrate, midazolam, midazolam, midazolam, ondansetron (ZOFRAN) IV, oxyCODONE, promethazine (PHENERGAN) injection (IM or IVPB), sodium chloride, sodium chloride flush, sodium chloride flush    Patient Profile   Mr Moradi is a 52 year with a history of visually impaired, DM, HTN, depression, anxiety takes 2 mg xanax 3 times a day, SVT, CKD Stage II, PAF, and CAD.Marland Kitchen   Anterior Stemi--> CABG--> cardiogenic shock.   Assessment/Plan   1. CAD: Admitted with anterolateral MI from occluded diagonal treated with DES.  Patient then had CABG  with LIMA-LAD, SVG-D, SVG-PDA, and left radial-OM for complete revascularization.    - Continue ASA 81. Will likely drop this at discharge given need for Plavix and anticoagulation.  - Continue Plavix 75 daily.  - atorvastatin 80 mg daily.  - Amlodipine for radial harvest held with hypotension on pressors.    2. Atrial fibrillation: Paroxysmal, s/p Maze and appendage clipping.  He was in AF with RVR on 10/27, now  back in NSR.  - Decrease amiodarone gtt to 30 mg/hr.  - On heparin gtt, transition to Eliquis eventually.  3. AKI on CKD stage 3: Baseline creatinine around 1.6, up to 5 post-op.  He has likely had multiple hits including contrast with initial cath/PCI and possible peri-operative hypotension.  Suspect ATN is present, will need to support him for now and hopefully function will eventually improve. He is on CVVH. CVP 10 today, no weight yet.  - Aim for net negative UF  50-100 cc/hr today.  4. Acute systolic CHF: Ischemic cardiomyopathy.  Echo prior to this admission with EF 50-55% (pre-MI and CABG).  Intraoperative TEE with EF 20%.  CVP 9-10 today, on NE 8 and off vasopression.   Limited echo on 10/28 showed minimal pericardial effusion, EF in 25% range. Co-ox is low today at 53% with but CI ok at 2.2 off FloTrac.  - Slow wean of NE.  - He will need CVVH for volume management, pulling net negative 50-100 cc/hr today.  5. Anemia: Post-op, stable.   6. ID: Afebrile today, WBCs 13 => 11.  Thick secretions.  - Continue empiric cefepime.  - Send trach aspirate culture.  7. Acute hypoxemic respiratory failure: Suspect primarily pulmonary edema, now intubated.  - Per CCM.  - Fluid removal via CVVH.  8. Thrombocytopenia: Resolved.   CRITICAL CARE Performed by: Loralie Champagne  Total critical care time: 40 minutes  Critical care time was exclusive of separately billable procedures and treating other patients.  Critical care was necessary to treat or prevent imminent or life-threatening deterioration.  Critical care was time spent personally by me on the following activities: development of treatment plan with patient and/or surrogate as well as nursing, discussions with consultants, evaluation of patient's response to treatment, examination of patient, obtaining history from patient or surrogate, ordering and performing treatments and interventions, ordering and review of laboratory studies, ordering and review of radiographic studies, pulse oximetry and re-evaluation of patient's condition.  Loralie Champagne 07/12/2021 8:09 AM

## 2021-07-12 NOTE — Progress Notes (Signed)
[facility] Progress Note   Subjective:  HD catheter replaced yesterday, running fine since.  In NSR with less pressors now. I/Os yesterday 3.9 / 4.5. net +6L for admission.  UOP 137m. UFG 100/hr neg.   Objective Vitals:   07/12/21 0800 07/12/21 0815 07/12/21 0830 07/12/21 0845  BP: 113/73     Pulse: (!) 59 (!) 59 60 60  Resp: (!) 0 (!) 0 (!) 26 (!) 22  Temp: 98.1 F (36.7 C) 98.1 F (36.7 C) 98.1 F (36.7 C) 98.2 F (36.8 C)  TempSrc: Bladder     SpO2: 100% 100% 100% 100%  Weight:      Height:       Physical Exam General: intubated, sedated Heart: RRR currently on monitor, no rub Lungs: coarse on vent Abdomen: soft, nontender Extremities: diffuse 1+ edema  Additional Objective Labs: Basic Metabolic Panel: Recent Labs  Lab 07/11/21 0403 07/11/21 1602 07/11/21 2035 07/12/21 0426  NA 134* 131* 136 135  K 4.8 4.7 4.4 4.6  CL 100 98  --  103  CO2 25 24  --  24  GLUCOSE 227* 239*  --  197*  BUN 29* 30*  --  25*  CREATININE 4.43* 4.67*  --  3.94*  CALCIUM 7.5* 7.2*  --  7.9*  PHOS 4.2 4.2  --  3.0    Liver Function Tests: Recent Labs  Lab 07/07/21 1353 07/10/21 0937 07/10/21 1555 07/11/21 0403 07/11/21 1602 07/12/21 0426  AST 113* 29  --   --   --   --   ALT 24 5  --   --   --   --   ALKPHOS 38 40  --   --   --   --   BILITOT 1.0 0.6  --   --   --   --   PROT 6.1* 5.2*  --   --   --   --   ALBUMIN 3.3* 2.3*   < > 2.2* 2.0* 1.9*   < > = values in this interval not displayed.    No results for input(s): LIPASE, AMYLASE in the last 168 hours. CBC: Recent Labs  Lab 07/07/21 1353 07/08/21 0115 07/09/21 1625 07/09/21 2013 07/09/21 2248 07/10/21 0026 07/10/21 0409 07/11/21 0413 07/11/21 2035 07/12/21 0426  WBC 9.6   < > 15.6*  --  19.1*  --  16.3* 13.0*  --  11.0*  NEUTROABS 6.6  --   --   --   --   --   --  10.3*  --   --   HGB 14.0   < > 9.1*   < > 9.3*   < > 8.9* 8.4* 8.5* 9.0*  HCT 40.6   < > 27.4*   < > 27.6*   < > 25.8*  24.7* 25.0* 27.0*  MCV 92.7   < > 95.8  --  93.6  --  92.5 94.3  --  94.7  PLT 205   < > 181  --  234  --  213 148*  --  196   < > = values in this interval not displayed.    Blood Culture    Component Value Date/Time   SDES TRACHEAL ASPIRATE 07/10/2021 1120   SPECREQUEST NONE 07/10/2021 1120   CULT  07/10/2021 1120    RARE Normal respiratory flora-no Staph aureus or Pseudomonas seen [facility] [address], [place] [id number]   REPTSTATUS 07/12/2021 FINAL 07/10/2021  1120    Cardiac Enzymes: No results for input(s): CKTOTAL, CKMB, CKMBINDEX, TROPONINI in the last 168 hours. CBG: Recent Labs  Lab 07/11/21 1259 07/11/21 1600 07/11/21 1949 07/11/21 2327 07/12/21 0432  GLUCAP 148* 233* 144* 238* 196*    Iron Studies: No results for input(s): IRON, TIBC, TRANSFERRIN, FERRITIN in the last 72 hours. '@[email or web address]' @ Studies/Results: DG Chest 1 View  Result Date: 07/11/2021 CLINICAL DATA:  Central line placement. EXAM: CHEST  1 VIEW COMPARISON:  05/11/2021 FINDINGS: 1433 hours. The cardio pericardial silhouette is enlarged. Endotracheal tube tip is 5.2 cm above the base of the carina. Right IJ central line tip overlies the innominate vein confluence. Left IJ central line tip also positioned at the innominate vein confluence. NG tube tip is in the proximal stomach with side port just below the level of the GE junction. Bibasilar atelectasis noted, left greater than right. Left chest tube remains in place without evidence for left-sided pneumothorax. Probable midline mediastinal/pericardial drain. IMPRESSION: Cardiomegaly with left greater than right basilar atelectasis. No pneumothorax. Support apparatus as above. Electronically Signed   By: Misty Stanley M.D.   On: 07/11/2021 14:40   DG Chest Port 1 View  Result Date: 07/11/2021 CLINICAL DATA:  Difficulty breathing, left pleural effusion EXAM: PORTABLE CHEST 1 VIEW COMPARISON:  Previous studies  including the examination of 07/10/2021 FINDINGS: Transverse diameter of heart is increased. Thoracic aorta is tortuous and ectatic. There is evidence of previous coronary bypass surgery and previous placement of metallic clip in the area of left atrial appendage. There are no signs of alveolar pulmonary edema. There are linear densities in left lower lung fields suggesting subsegmental atelectasis. There is slight improvement in aeration in the medial left lower lung fields. There is blunting of left lateral CP angle. There is a catheter overlying the lateral aspect of left lower lung fields, possibly chest tube. There is no pneumothorax. Tip of endotracheal tube is at the level aortic arch. Nasogastric tube is noted traversing the esophagus. Tips of central venous catheter is seen in the course of superior vena cava. IMPRESSION: There is slight improvement in aeration of medial left lower lung fields suggesting decrease in atelectasis/pneumonia. Small left pleural effusion with no significant change. There is no pneumothorax. Reading location: [place], [place]. Electronically Signed   By: Elmer Picker M.D.   On: 07/11/2021 08:36   ECHOCARDIOGRAM LIMITED  Result Date: 07/11/2021    ECHOCARDIOGRAM LIMITED REPORT   Patient Name:   ZEFERINO MOUNTS Date of Exam: 07/11/2021 Medical Rec #:  [id number]      Height:       74.0 in Accession #:    [id number]     Weight:       267.4 lb Date of Birth:  01/31/1966      BSA:          2.459 m Patient Age:    55 years       BP:           109/55 mmHg Patient Gender: M              HR:           83 bpm. Exam Location:  Inpatient Procedure: Limited Echo and Intracardiac Opacification Agent Indications:    Evaluate EF at normal heart rate  History:        Patient has prior history of Echocardiogram examinations. CAD  and Previous Myocardial Infarction, Prior CABG and Abnormal ECG,                 Arrythmias:SVT, Signs/Symptoms:Shortness of Breath and Chest                  Pain; Risk Factors:Hypertension and Diabetes. Cardiac shock.  Sonographer:    Merrie Roof RDCS Referring Phys: [place]  1. Left ventricular ejection fraction, by estimation, is 20 to 25%. The left ventricle has severely decreased function. The left ventricle demonstrates regional wall motion abnormalities (see scoring diagram/findings for description). There is severe akinesis of the left ventricular, mid-apical anteroseptal wall and apical segment. FINDINGS  Left Ventricle: Left ventricular ejection fraction, by estimation, is 20 to 25%. The left ventricle has severely decreased function. The left ventricle demonstrates regional wall motion abnormalities. Severe akinesis of the left ventricular, mid-apical anteroseptal wall and apical segment. Definity contrast agent was given IV to delineate the left ventricular endocardial borders.  LV Volumes (MOD) LV vol d, MOD A2C: 51.2 ml LV vol d, MOD A4C: 96.2 ml LV vol s, MOD A2C: 31.3 ml LV vol s, MOD A4C: 49.5 ml LV SV MOD A2C:     19.9 ml LV SV MOD A4C:     96.2 ml LV SV MOD BP:      26.8 ml Mertie Moores MD Electronically signed by Mertie Moores MD Signature Date/Time: 07/11/2021/6:02:39 PM    Final    ECHOCARDIOGRAM LIMITED  Result Date: 07/10/2021    ECHOCARDIOGRAM LIMITED REPORT   Patient Name:   DAILEN MCCLISH Date of Exam: 07/10/2021 Medical Rec #:  [id number]      Height:       74.0 in Accession #:    [id number]     Weight:       263.2 lb Date of Birth:  [birth year]      BSA:          2.443 m Patient Age:    39 years       BP:           120/58 mmHg Patient Gender: M              HR:           131 bpm. Exam Location:  Inpatient Procedure: Limited Echo, Cardiac Doppler and Color Doppler Indications:    I31.3 Pericardial effusion (noninflammatory)  History:        Patient has prior history of Echocardiogram examinations, most                 recent 06/25/2021. Previous Myocardial Infarction and CAD, Prior                  CABG and Abnormal ECG, Arrythmias:SVT, Signs/Symptoms:Shortness                 of Breath, Dyspnea and Chest Pain; Risk Factors:Hypertension and                 Diabetes. Cardiac shock.  Sonographer:    Roseanna Rainbow RDCS Referring Phys: 48 RAKESH V ALVA  Sonographer Comments: Technically difficult study due to poor echo windows, patient is morbidly obese and echo performed with patient supine and on artificial respirator. Image acquisition challenging due to patient body habitus. Wound dressing in subcostal region. IMPRESSIONS  1. Left ventricular ejection fraction, by estimation, is 50 to 55% which may be related to tachycardia. The left ventricle has low normal function. The  left ventricle has no regional wall motion abnormalities. There is mild concentric left ventricular hypertrophy.  2. Right ventricular systolic function is normal. The right ventricular size is normal.  3. The mitral valve is normal in structure. No evidence of mitral valve regurgitation. No evidence of mitral stenosis. Moderate mitral annular calcification.  4. The aortic valve was not well visualized. Aortic valve regurgitation is not visualized. Mild to moderate aortic valve sclerosis/calcification is present, without any evidence of aortic stenosis.  5. Aortic dilatation noted. There is mild dilatation of the aortic root, measuring 40 mm.  6. The inferior vena cava is normal in size with greater than 50% respiratory variability, suggesting right atrial pressure of 3 mmHg.  7. A trivial pericardial effusion is posterior to the left ventricle.  8. Consider repeat limited echo for LVF once HR slowed down. FINDINGS  Left Ventricle: Left ventricular ejection fraction, by estimation, is 55 to 60%. The left ventricle has normal function. The left ventricle has no regional wall motion abnormalities. The left ventricular internal cavity size was normal in size. There is  mild concentric left ventricular hypertrophy. Left ventricular diastolic  function could not be evaluated. Right Ventricle: The right ventricular size is normal. No increase in right ventricular wall thickness. Right ventricular systolic function is normal. Left Atrium: Left atrial size was normal in size. Right Atrium: Right atrial size was normal in size. Pericardium: Trivial pericardial effusion is present. The pericardial effusion is posterior to the left ventricle. Mitral Valve: The mitral valve is normal in structure. There is mild thickening of the mitral valve leaflet(s). Moderate mitral annular calcification. No evidence of mitral valve stenosis. Tricuspid Valve: The tricuspid valve is normal in structure. Tricuspid valve regurgitation is not demonstrated. No evidence of tricuspid stenosis. Aortic Valve: The aortic valve was not well visualized. Aortic valve regurgitation is not visualized. Mild to moderate aortic valve sclerosis/calcification is present, without any evidence of aortic stenosis. Pulmonic Valve: The pulmonic valve was normal in structure. Pulmonic valve regurgitation is not visualized. No evidence of pulmonic stenosis. Aorta: The aortic root is normal in size and structure and aortic dilatation noted. There is mild dilatation of the aortic root, measuring 40 mm. Venous: The inferior vena cava is normal in size with greater than 50% respiratory variability, suggesting right atrial pressure of 3 mmHg. IAS/Shunts: No atrial level shunt detected by color flow Doppler. LEFT VENTRICLE PLAX 2D LVIDd:         5.20 cm LVIDs:         3.80 cm LV PW:         1.45 cm LV IVS:        1.25 cm  LEFT ATRIUM         Index LA diam:    3.50 cm 1.43 cm/m   AORTA Ao Root diam: 4.00 cm Fransico Him MD Electronically signed by Fransico Him MD Signature Date/Time: 07/10/2021/10:02:47 AM    Final    Medications:   prismasol BGK 4/2.5 400 mL/hr at 07/12/21 0439    prismasol BGK 4/2.5 300 mL/hr at 07/12/21 0439   sodium chloride     sodium chloride 10 mL/hr at 07/12/21 0800   sodium  chloride     albumin human 12.5 g (07/08/21 1827)   And   sodium chloride Stopped (07/08/21 2051)   amiodarone 30 mg/hr (07/12/21 0900)   ceFEPime (MAXIPIME) IV Stopped (07/11/21 2140)   dexmedetomidine (PRECEDEX) IV infusion 0.8 mcg/kg/hr (07/12/21 0900)   feeding supplement (VITAL 1.5  CAL) 20 mL/hr at 07/12/21 0850   fentaNYL infusion INTRAVENOUS 200 mcg/hr (07/12/21 0900)   heparin 1,500 Units/hr (07/12/21 0900)   lactated ringers     lactated ringers 30 mL/hr at 07/12/21 0900   lactated ringers     midazolam 8 mg/hr (07/12/21 0900)   norepinephrine (LEVOPHED) Adult infusion 9 mcg/min (07/12/21 0900)   prismasol BGK 4/2.5 1,500 mL/hr at 07/12/21 0913   promethazine (PHENERGAN) injection (IM or IVPB) Stopped (07/09/21 1628)    acetaminophen  1,000 mg Oral Q6H   Or   acetaminophen (TYLENOL) oral liquid 160 mg/5 mL  1,000 mg Per Tube Q6H   aspirin EC  81 mg Oral Daily   Or   aspirin  81 mg Per Tube Daily   atorvastatin  80 mg Oral Daily   B-complex with vitamin C  1 tablet Per Tube Daily   bisacodyl  10 mg Oral Daily   Or   bisacodyl  10 mg Rectal Daily   chlorhexidine gluconate (MEDLINE KIT)  15 mL Mouth Rinse BID   Chlorhexidine Gluconate Cloth  6 each Topical Daily   clonazePAM  2 mg Per Tube BID   clopidogrel  75 mg Per Tube Daily   docusate  100 mg Per Tube BID   insulin aspart  3 Units Subcutaneous Q4H   insulin aspart  3-9 Units Subcutaneous Q4H   mouth rinse  15 mL Mouth Rinse 10 times per day   metoCLOPramide (REGLAN) injection  5 mg Intravenous Q8H   multivitamin  15 mL Per Tube Daily   pantoprazole sodium  40 mg Per Tube Daily   polyethylene glycol  17 g Per Tube BID   sennosides  10 mL Per Tube BID   sodium chloride flush  10-40 mL Intracatheter Q12H   sodium chloride flush  3 mL Intravenous Q12H   sorbitol  30 mL Per Tube Once     Assessment/Plan: 55yo M DM, HTN, A fib, CKD currently being followed for severe AKI s/p CABG x 4 and MAZE.   **Severe AKI on  CKD 3b:  Baseline 1.6 (Followed by Dr. Moshe Cipro outpt), was 1.9 prior to CABG and now AKI in setting of hemodynamic instability post op.  Initiated CRRT 10/27 for UF with ~anuria and volume overload.  Electrolytes, bicarb good today.  Tolerating UF 100 neg/hr - cont for today.   Too early to say if will need long term HD.    **Anemia: Hb 9s, transfuse PRN.    **Hyponatremia:resolved  **CAD s/p CABG  **A fib: amiodarone, anticoag.  **DM: per primary  Jannifer Hick MD 07/12/2021, 9:20 AM  [facility] Pager: [phone]

## 2021-07-12 NOTE — Progress Notes (Signed)
[address]       [place] [address]             [phone]                 4 Days Post-Op Procedure(s) (LRB): CORONARY ARTERY BYPASS GRAFTING (CABG) TIMES FOUR USING LEFT INTERNAL MAMMARY ARTERY, GREATER SAPHENOUS VEIN HARVESTED ENDOSCOPICALLY, AND LEFT RADIAL ARTERY HARVESTED OPEN (N/A) TRANSESOPHAGEAL ECHOCARDIOGRAM (TEE) (N/A) RADIAL ARTERY HARVEST (Left) ENDOVEIN HARVEST OF GREATER SAPHENOUS VEIN (Right) MAZE   Events: No events Pulling fluid Off vaso _______________________________________________________________ Vitals: BP 113/73 (BP Location: Right Arm)   Pulse 60   Temp 98.4 F (36.9 C)   Resp (!) 0   Ht 6\' 2"  (1.88 m)   Wt 121.3 kg   SpO2 100%   BMI 34.33 kg/m  Filed Weights   07/09/21 0407 07/10/21 0500 07/11/21 0500  Weight: 120.8 kg 119.4 kg 121.3 kg     - Neuro: sedated  - Cardiovascular: sinus  Drips: levo 8.  Amio 30. CVP:  [6 mmHg-20 mmHg] 10 mmHg  - Pulm:  Vent Mode: PRVC FiO2 (%):  [40 %] 40 % Set Rate:  [20 bmp] 20 bmp Vt Set:  [550 mL] 550 mL PEEP:  [5 cmH20-8 cmH20] 5 cmH20 Plateau Pressure:  [10 JGG83-66 cmH20] 10 cmH20  ABG    Component Value Date/Time   PHART 7.427 07/11/2021 2035   PCO2ART 39.5 07/11/2021 2035   PO2ART 111 (H) 07/11/2021 2035   HCO3 26.1 07/11/2021 2035   TCO2 27 07/11/2021 2035   ACIDBASEDEF 9.0 (H) 07/10/2021 0303   O2SAT 54.2 07/12/2021 0815    - Abd: Soft - Extremity: warm  .Intake/Output      10/28 0701 10/29 0700 10/29 0701 10/30 0700   I.V. (mL/kg) 2945.5 (24.3) 557.3 (4.6)   Other     NG/GT 650 313.7   IV Piggyback 300    Total Intake(mL/kg) 3895.6 (32.1) 871 (7.2)   Urine (mL/kg/hr) 135 (0) 25 (0.1)   Other 4275 830   Chest Tube 70    Total Output 4480 855   Net -584.4 +16           _______________________________________________________________ Labs: CBC Latest Ref Rng & Units 07/12/2021 07/11/2021 07/11/2021  WBC 4.0 - 10.5 K/uL 11.0(H) - 13.0(H)   Hemoglobin 13.0 - 17.0 g/dL 9.0(L) 8.5(L) 8.4(L)  Hematocrit 39.0 - 52.0 % 27.0(L) 25.0(L) 24.7(L)  Platelets 150 - 400 K/uL 196 - 148(L)   CMP Latest Ref Rng & Units 07/12/2021 07/11/2021 07/11/2021  Glucose 70 - 99 mg/dL 197(H) - 239(H)  BUN 6 - 20 mg/dL 25(H) - 30(H)  Creatinine 0.61 - 1.24 mg/dL 3.94(H) - 4.67(H)  Sodium 135 - 145 mmol/L 135 136 131(L)  Potassium 3.5 - 5.1 mmol/L 4.6 4.4 4.7  Chloride 98 - 111 mmol/L 103 - 98  CO2 22 - 32 mmol/L 24 - 24  Calcium 8.9 - 10.3 mg/dL 7.9(L) - 7.2(L)  Total Protein 6.5 - 8.1 g/dL - - -  Total Bilirubin 0.3 - 1.2 mg/dL - - -  Alkaline Phos 38 - 126 U/L - - -  AST 15 - 41 U/L - - -  ALT 0 - 44 U/L - - -    CXR: -  _______________________________________________________________  Assessment and Plan: POD 4 s/p emergency CABG  Neuro: maintain sedation CV: on levo.  On amio.  Remains in sinus.  On heparin.   Pulm: continue vent support Renal: aneuric.  On CRRT.  Removing volume GI: trickle tube feeds Heme: stable, on heparing gtt ID: [email or web address].  Cx pending Endo: SSI Dispo: continue ICU care.   Lajuana Matte 07/12/2021 10:23 AM

## 2021-07-13 ENCOUNTER — Inpatient Hospital Stay (HOSPITAL_COMMUNITY): Payer: Medicare HMO

## 2021-07-13 DIAGNOSIS — N179 Acute kidney failure, unspecified: Secondary | ICD-10-CM | POA: Diagnosis not present

## 2021-07-13 DIAGNOSIS — J9601 Acute respiratory failure with hypoxia: Secondary | ICD-10-CM | POA: Diagnosis not present

## 2021-07-13 DIAGNOSIS — R570 Cardiogenic shock: Secondary | ICD-10-CM | POA: Diagnosis not present

## 2021-07-13 DIAGNOSIS — Z9911 Dependence on respirator [ventilator] status: Secondary | ICD-10-CM | POA: Diagnosis not present

## 2021-07-13 LAB — RENAL FUNCTION PANEL
Albumin: 1.9 g/dL — ABNORMAL LOW (ref 3.5–5.0)
Albumin: 1.9 g/dL — ABNORMAL LOW (ref 3.5–5.0)
Anion gap: 5 (ref 5–15)
Anion gap: 6 (ref 5–15)
BUN: 23 mg/dL — ABNORMAL HIGH (ref 6–20)
BUN: 22 mg/dL — ABNORMAL HIGH (ref 6–20)
CO2: 25 mmol/L (ref 22–32)
CO2: 25 mmol/L (ref 22–32)
Calcium: 7.8 mg/dL — ABNORMAL LOW (ref 8.9–10.3)
Calcium: 7.7 mg/dL — ABNORMAL LOW (ref 8.9–10.3)
Chloride: 105 mmol/L (ref 98–111)
Chloride: 104 mmol/L (ref 98–111)
Creatinine, Ser: 3.44 mg/dL — ABNORMAL HIGH (ref 0.61–1.24)
Creatinine, Ser: 3.11 mg/dL — ABNORMAL HIGH (ref 0.61–1.24)
GFR, Estimated: 20 mL/min — ABNORMAL LOW (ref >60)
GFR, Estimated: 23 mL/min — ABNORMAL LOW (ref >60)
Glucose, Bld: 186 mg/dL — ABNORMAL HIGH (ref 70–99)
Glucose, Bld: 134 mg/dL — ABNORMAL HIGH (ref 70–99)
Phosphorus: 2.7 mg/dL (ref 2.5–4.6)
Phosphorus: 3.3 mg/dL (ref 2.5–4.6)
Potassium: 4.6 mmol/L (ref 3.5–5.1)
Potassium: 4.4 mmol/L (ref 3.5–5.1)
Sodium: 135 mmol/L (ref 135–145)
Sodium: 135 mmol/L (ref 135–145)

## 2021-07-13 LAB — CBC
HCT: 27.4 % — ABNORMAL LOW (ref 39.0–52.0)
Hemoglobin: 9 g/dL — ABNORMAL LOW (ref 13.0–17.0)
MCH: 31.6 pg (ref 26.0–34.0)
MCHC: 32.8 g/dL (ref 30.0–36.0)
MCV: 96.1 fL (ref 80.0–100.0)
Platelets: 239 10*3/uL (ref 150–400)
RBC: 2.85 MIL/uL — ABNORMAL LOW (ref 4.22–5.81)
RDW: 13.2 % (ref 11.5–15.5)
WBC: 11.2 10*3/uL — ABNORMAL HIGH (ref 4.0–10.5)
nRBC: 0 % (ref 0.0–0.2)

## 2021-07-13 LAB — GLUCOSE, CAPILLARY
Glucose-Capillary: 120 mg/dL — ABNORMAL HIGH (ref 70–99)
Glucose-Capillary: 123 mg/dL — ABNORMAL HIGH (ref 70–99)
Glucose-Capillary: 159 mg/dL — ABNORMAL HIGH (ref 70–99)
Glucose-Capillary: 161 mg/dL — ABNORMAL HIGH (ref 70–99)
Glucose-Capillary: 182 mg/dL — ABNORMAL HIGH (ref 70–99)
Glucose-Capillary: 194 mg/dL — ABNORMAL HIGH (ref 70–99)

## 2021-07-13 LAB — COOXEMETRY PANEL
Carboxyhemoglobin: 0.9 % (ref 0.5–1.5)
Methemoglobin: 0.8 % (ref 0.0–1.5)
O2 Saturation: 54.8 %
Total hemoglobin: 7.9 g/dL — ABNORMAL LOW (ref 12.0–16.0)

## 2021-07-13 LAB — HEPARIN LEVEL (UNFRACTIONATED): Heparin Unfractionated: 0.24 IU/mL — ABNORMAL LOW (ref 0.30–0.70)

## 2021-07-13 LAB — MAGNESIUM: Magnesium: 2.5 mg/dL — ABNORMAL HIGH (ref 1.7–2.4)

## 2021-07-13 MED ORDER — AMIODARONE HCL 200 MG PO TABS
200.0000 mg | ORAL_TABLET | Freq: Two times a day (BID) | ORAL | Status: DC
  Administered 2021-07-13 (×3): 200 mg
  Filled 2021-07-13 (×3): qty 1

## 2021-07-13 MED ORDER — DEXMEDETOMIDINE HCL IN NACL 400 MCG/100ML IV SOLN
0.0000 ug/kg/h | INTRAVENOUS | Status: DC
  Administered 2021-07-13: 0.6 ug/kg/h via INTRAVENOUS
  Filled 2021-07-13: qty 100

## 2021-07-13 MED ORDER — SORBITOL 70 % SOLN
30.0000 mL | Freq: Once | Status: AC
  Administered 2021-07-13: 30 mL
  Filled 2021-07-13: qty 30

## 2021-07-13 MED ORDER — DEXMEDETOMIDINE HCL IN NACL 400 MCG/100ML IV SOLN: 0.0000 ug/kg/h | INTRAVENOUS | Status: DC

## 2021-07-13 MED ORDER — METHYLNALTREXONE BROMIDE 12 MG/0.6ML ~~LOC~~ SOLN
12.0000 mg | Freq: Once | SUBCUTANEOUS | Status: AC
  Administered 2021-07-13: 12 mg via SUBCUTANEOUS
  Filled 2021-07-13: qty 0.6

## 2021-07-13 MED ORDER — PROPOFOL 1000 MG/100ML IV EMUL
0.0000 ug/kg/min | INTRAVENOUS | Status: DC
  Administered 2021-07-13: 50 ug/kg/min via INTRAVENOUS
  Administered 2021-07-13: 35 ug/kg/min via INTRAVENOUS
  Administered 2021-07-13: 50 ug/kg/min via INTRAVENOUS
  Administered 2021-07-13: 40 ug/kg/min via INTRAVENOUS
  Administered 2021-07-14: 50 ug/kg/min via INTRAVENOUS
  Administered 2021-07-14 (×2): 40 ug/kg/min via INTRAVENOUS
  Administered 2021-07-14: 30 ug/kg/min via INTRAVENOUS
  Administered 2021-07-14: 40 ug/kg/min via INTRAVENOUS
  Administered 2021-07-14 – 2021-07-15 (×2): 30 ug/kg/min via INTRAVENOUS
  Administered 2021-07-15: 20 ug/kg/min via INTRAVENOUS
  Filled 2021-07-13 (×6): qty 100
  Filled 2021-07-13 (×2): qty 200
  Filled 2021-07-13 (×2): qty 100

## 2021-07-13 MED ORDER — MIDAZOLAM HCL 2 MG/2ML IJ SOLN: 2.0000 mg | INTRAMUSCULAR | Status: DC | PRN

## 2021-07-13 MED ORDER — HEPARIN SODIUM (PORCINE) 5000 UNIT/ML IJ SOLN
5000.0000 [IU] | Freq: Three times a day (TID) | INTRAMUSCULAR | Status: DC
  Administered 2021-07-14 – 2021-07-19 (×16): 5000 [IU] via SUBCUTANEOUS
  Filled 2021-07-13 (×16): qty 1

## 2021-07-13 NOTE — Progress Notes (Signed)
[address]       [place] [address]             [phone]                 5 Days Post-Op Procedure(s) (LRB): CORONARY ARTERY BYPASS GRAFTING (CABG) TIMES FOUR USING LEFT INTERNAL MAMMARY ARTERY, GREATER SAPHENOUS VEIN HARVESTED ENDOSCOPICALLY, AND LEFT RADIAL ARTERY HARVESTED OPEN (N/A) TRANSESOPHAGEAL ECHOCARDIOGRAM (TEE) (N/A) RADIAL ARTERY HARVEST (Left) ENDOVEIN HARVEST OF GREATER SAPHENOUS VEIN (Right) MAZE   Events: No events  _______________________________________________________________ Vitals: BP (!) 104/58   Pulse (!) 56   Temp 98.2 F (36.8 C) (Oral)   Resp 20   Ht 6\' 2"  (1.88 m)   Wt 121.3 kg   SpO2 100%   BMI 34.33 kg/m  Filed Weights   07/09/21 0407 07/10/21 0500 07/11/21 0500  Weight: 120.8 kg 119.4 kg 121.3 kg     - Neuro: sedated, ,but arousable  - Cardiovascular: sinus  Drips: levo 8.  CVP:  [5 mmHg-14 mmHg] 7 mmHg  - Pulm:  Vent Mode: PRVC FiO2 (%):  [40 %] 40 % Set Rate:  [20 bmp] 20 bmp Vt Set:  [550 mL] 550 mL PEEP:  [5 cmH20] 5 cmH20 Plateau Pressure:  [12 cmH20-18 cmH20] 17 cmH20  ABG    Component Value Date/Time   PHART 7.427 07/11/2021 2035   PCO2ART 39.5 07/11/2021 2035   PO2ART 111 (H) 07/11/2021 2035   HCO3 26.1 07/11/2021 2035   TCO2 27 07/11/2021 2035   ACIDBASEDEF 9.0 (H) 07/10/2021 0303   O2SAT 54.8 07/13/2021 0237    - Abd: Soft, distended - Extremity: warm  .Intake/Output      10/29 0701 10/30 0700 10/30 0701 10/31 0700   I.V. (mL/kg) 3172.1 (26.2) 306.8 (2.5)   NG/GT 1373.7    IV Piggyback 200 100.1   Total Intake(mL/kg) 4745.7 (39.1) 406.8 (3.4)   Urine (mL/kg/hr) 55 (0)    Other 6657 687   Chest Tube 70    Total Output 6782 687   Net -2036.3 -280.2           _______________________________________________________________ Labs: CBC Latest Ref Rng & Units 07/13/2021 07/12/2021 07/11/2021  WBC 4.0 - 10.5 K/uL 11.2(H) 11.0(H) -  Hemoglobin 13.0 - 17.0 g/dL 9.0(L) 9.0(L)  8.5(L)  Hematocrit 39.0 - 52.0 % 27.4(L) 27.0(L) 25.0(L)  Platelets 150 - 400 K/uL 239 196 -   CMP Latest Ref Rng & Units 07/13/2021 07/12/2021 07/12/2021  Glucose 70 - 99 mg/dL 186(H) 201(H) 197(H)  BUN 6 - 20 mg/dL 23(H) 25(H) 25(H)  Creatinine 0.61 - 1.24 mg/dL 3.44(H) 3.73(H) 3.94(H)  Sodium 135 - 145 mmol/L 135 136 135  Potassium 3.5 - 5.1 mmol/L 4.6 4.6 4.6  Chloride 98 - 111 mmol/L 105 104 103  CO2 22 - 32 mmol/L 25 26 24   Calcium 8.9 - 10.3 mg/dL 7.8(L) 7.7(L) 7.9(L)  Total Protein 6.5 - 8.1 g/dL - - -  Total Bilirubin 0.3 - 1.2 mg/dL - - -  Alkaline Phos 38 - 126 U/L - - -  AST 15 - 41 U/L - - -  ALT 0 - 44 U/L - - -    CXR: -  _______________________________________________________________  Assessment and Plan: POD 5 s/p emergency CABG  Neuro: maintain sedation.  Will switch to propofol CV: on levo.  On amio PO  Remains in sinus, will stop heparin gtt   Pulm: continue vent support.  Plan for extubation tomorrow Renal:  aneuric.  On CRRT.  Running even GI: trickle tube feeds, but will hold for now.  Will give enema Heme: stable,  ID: afebrile.  Cx pending Endo: SSI Dispo: continue ICU care.   Lajuana Matte 07/13/2021 10:04 AM

## 2021-07-13 NOTE — Progress Notes (Signed)
ANTICOAGULATION CONSULT NOTE - Follow Up Consult  Pharmacy Consult for heparin Indication: atrial fibrillation  Labs: Recent Labs    07/10/21 0937 07/10/21 1555 07/11/21 0413 07/11/21 1602 07/11/21 2035 07/12/21 0426 07/12/21 1446 07/13/21 0237  HGB  --   --  8.4*  --  8.5* 9.0*  --  9.0*  HCT  --   --  24.7*  --  25.0* 27.0*  --  27.4*  PLT  --   --  148*  --   --  196  --  239  LABPROT 15.0  --   --   --   --   --   --   --   INR 1.2  --   --   --   --   --   --   --   HEPARINUNFRC  --   --   --    < >  --  0.12* 0.24* 0.24*  CREATININE  --    < >  --    < >  --  3.94* 3.73* 3.44*   < > = values in this interval not displayed.     Assessment: 55yo male remains subtherapeutic on heparin after rate change yesterday am; no infusion issues or signs of bleeding per RN, slow titration after recent CABG w/ MAZE.  Goal of Therapy:  Heparin level 0.3-0.5 units/ml   Plan:  Will increase heparin infusion by 1-2 units/kg/hr to 1700 units/hr and check level in 8 hours.    Wynona Neat, PharmD, BCPS  07/13/2021,3:46 AM

## 2021-07-13 NOTE — Progress Notes (Signed)
eLink Physician-Brief Progress Note Patient Name: Billy [name] DOB: 1966/02/17 MRN: [id number]   Date of Service  07/13/2021  HPI/Events of Note  Patient has been in sinus rhythm x 16 hours on Amiodarone gtt at 30 mg, he is having intermittent bradycardic and hypotensive spells .  eICU Interventions  Amiodarone gtt discontinued and patient transitioned to po Amiodarone per protocol.        Kerry Kass Domenik Trice 07/13/2021, 12:22 AM

## 2021-07-13 NOTE — Progress Notes (Signed)
NAME:  Billy [name], MRN:  [id number], DOB:  Dec 18, 1965, LOS: 6 ADMISSION DATE:  07/07/2021, CONSULTATION DATE:  07/13/2021  REFERRING MD:  Lynnette Caffey, CHIEF COMPLAINT:  cardiogenic shock , on bipap   History of Present Illness:  55 year old diabetic, hypertensive admitted 10/24 with STEMI.  He had recent admission 10/11 for chest pain and hypertensive urgency with normal LVEF on echo.  He underwent left heart cath which showed three-vessel disease, underwent PCI with DES to left circumflex followed by CABG x4 with radial harvest and maze procedure.  Intra-Op EF was noted to be 20%.  He was extubated postop day 1 but developed progressive shock requiring Levophed and vasopressin with AKI and oliguria.  He was placed on BiPAP for hypoxia, ABG showed metabolic acidosis for which she was given 3 amps of bicarb and PCCM consulted Seen by heart failure service, coox 56% started on milrinone  Pertinent  Medical History  Diabetes Hypertension CKD stage II -Baseline creatinine 1.8 Paroxysmal atrial fibrillation Anxiety -on Xanax 2 mg thrice daily  Significant Hospital Events:   10/24 admitted with STEMI  10/27 PCCM consulted, due to respiratory issues intubated early morning   10/28 No acute events overnight, flipped back into NSR with less pressor requirement and decreased FIO2 this AM  Interim History / Subjective:  Afebrile overnight. This morning he denies complaints. Less agitation. Remains on minimal NE. CVP 8.  Objective   Blood pressure (!) 104/58, pulse (!) 56, temperature 98.2 F (36.8 C), temperature source Oral, resp. rate 20, height 6\' 2"  (1.88 m), weight 121.3 kg, SpO2 100 %. CVP:  [5 mmHg-14 mmHg] 7 mmHg  Vent Mode: PRVC FiO2 (%):  [40 %] 40 % Set Rate:  [20 bmp] 20 bmp Vt Set:  [550 mL] 550 mL PEEP:  [5 cmH20] 5 cmH20 Plateau Pressure:  [12 cmH20-18 cmH20] 17 cmH20   Intake/Output Summary (Last 24 hours) at 07/13/2021 0942 Last data filed at 07/13/2021 0900 Gross  per 24 hour  Intake 4268.49 ml  Output 6681 ml  Net -2412.51 ml    Filed Weights   07/09/21 0407 07/10/21 0500 07/11/21 0500  Weight: 120.8 kg 119.4 kg 121.3 kg    Examination: General: critically ill appearing man lying in bed in NAD HEENT: [place]/AT, eyes anicteric Neuro: RASS -4, able to follow commands in all extremities. Nods to answer questions. Apneic on SBT x 2. No tracheal cough reflex. CV: S1S2, bradycardic, reg rhythm. Chest tubes in place with minimal bloody output. PULM:  thin secretions from ETT, CTAB. Riding the vent, apneic on SBTs. GI: obese, soft, NT Extremities: mild peripheral edema, no cyanosis. Skin:  No rashes, no wounds. No erythema around LIJ CVC.  I/O -2L, net +3.7L for admission  Coox 55% WBC 11.2 H/H 9/27.4 10/27 respiratory culture> abundant PMNs> normal flora 10/30 resp culture> pending  CXR personally reviewed> lower lobe pneumonia vs effusion, bilateral hemidiaphgrams opacified   Resolved Hospital Problem list     Assessment & Plan:  Cardiogenic shock in the setting of anterior STEMI  CABG x4 with radial harvest Ischemic cardiomyopathy  -ECHO 06/25/2021 EF 50-55%. Intraoperative TEE 20% Paroxsymal A-fib- back in NSR today -On carvedilol at baseline  P: -norepi to maintain SBP >100, MAP >65 -daily coox -con't CVVHD; running net even today -con't flotrack -amiodarone on hold -holding GDMT -DAPT, heparin for Afib -tele monitoring  Acute hypoxic respiratory failure due to acute pulmonary edema; previously extubated, reintubated on 10/27 Concern for evolving aspiration PNA P: -LTVV, 4-8cc/kg  IBW with goal Pplat<30 and DP<15 -VAP prevention protocol -PAD protocol-- switching off versed infusion today to prop. Con't precedex and fentanyl. -Weaning FiO2 to maintain SpO2 >90% -Con't empiric antibiotics; follow culture until finalized -Changing sedation today in preparation for hopeful extubation tomorrow. D/c versed infusion, start  propofol. Con't precedex. Versed PRNs if needed. Con't fentanyl.  Chronic benzo use, anxiety, severe agitation. High dose benzo use chronically PTA. -scheduled clonazepam -versed PRN  Oliguric renal failure likely secondary to ATN superimposed on CKD stage IIb -ATN related to contrast and hypotension P: -Con't CRRT -goal euvolemia -foley removed for oliguria, bladder scan Qshift  Type 2 diabetes  with hyperglycemia -Home medications include tradjenta  P: -con't levemir and TF short-acting insulin with hold parameters -SSI PRN -goal BG 140-180  Constipation, concern for reflux of TF -con't reglan -soap suds enema -miralax + colace + senna BID -dulcolax daily -adding relistor  At risk malnutrition  P: -trickle TF, not advancing until off pressors> on hold due to concern for refluxing TF and aspirating -vitamins and protein supplements  Acute anemia - stable P: -transfuse for Hb<7 or hemodynamically significant bleeding     Best Practice    Diet/type: tubefeeds DVT prophylaxis: systemic heparin GI prophylaxis: PPI Lines: Central line Foley:  removal ordered  Code Status:  full code Last date of multidisciplinary goals of care discussion [  ]  Critical care time:    This patient is critically ill with multiple organ system failure which requires frequent high complexity decision making, assessment, support, evaluation, and titration of therapies. This was completed through the application of advanced monitoring technologies and extensive interpretation of multiple databases. During this encounter critical care time was devoted to patient care services described in this note for 46 minutes.  Julian Hy, DO 07/13/21 3:48 PM [facility]

## 2021-07-13 NOTE — Progress Notes (Addendum)
[facility] Progress Note   Subjective:  CRRT running well.  Remains on vasopressor support. CVP 6 this AM.  I/Os yest 4.7 / 6.6, net neg 2L.  Anuric, being bladder scanned q shift.   Objective Vitals:   07/13/21 0715 07/13/21 0730 07/13/21 0745 07/13/21 0800  BP:    104/60  Pulse: (!) 56  (!) 56 (!) 55  Resp: (!) 0 (!) 0 (!) 0 (!) 8  Temp:      TempSrc:      SpO2: 100%  100% 100%  Weight:      Height:       Physical Exam General: intubated, sedated Heart: SB currently on monitor, no rub Lungs: coarse on vent, chest tube remains in place Abdomen: soft, nontender Extremities: trace edema  Additional Objective Labs: Basic Metabolic Panel: Recent Labs  Lab 07/12/21 0426 07/12/21 1446 07/13/21 0237  NA 135 136 135  K 4.6 4.6 4.6  CL 103 104 105  CO2 '24 26 25  ' GLUCOSE 197* 201* 186*  BUN 25* 25* 23*  CREATININE 3.94* 3.73* 3.44*  CALCIUM 7.9* 7.7* 7.8*  PHOS 3.0 2.7 2.7    Liver Function Tests: Recent Labs  Lab 07/07/21 1353 07/10/21 0937 07/10/21 1555 07/12/21 0426 07/12/21 1446 07/13/21 0237  AST 113* 29  --   --   --   --   ALT 24 5  --   --   --   --   ALKPHOS 38 40  --   --   --   --   BILITOT 1.0 0.6  --   --   --   --   PROT 6.1* 5.2*  --   --   --   --   ALBUMIN 3.3* 2.3*   < > 1.9* 1.9* 1.9*   < > = values in this interval not displayed.    No results for input(s): LIPASE, AMYLASE in the last 168 hours. CBC: Recent Labs  Lab 07/07/21 1353 07/08/21 0115 07/09/21 2248 07/10/21 0026 07/10/21 0409 07/11/21 0413 07/11/21 2035 07/12/21 0426 07/13/21 0237  WBC 9.6   < > 19.1*  --  16.3* 13.0*  --  11.0* 11.2*  NEUTROABS 6.6  --   --   --   --  10.3*  --   --   --   HGB 14.0   < > 9.3*   < > 8.9* 8.4* 8.5* 9.0* 9.0*  HCT 40.6   < > 27.6*   < > 25.8* 24.7* 25.0* 27.0* 27.4*  MCV 92.7   < > 93.6  --  92.5 94.3  --  94.7 96.1  PLT 205   < > 234  --  213 148*  --  196 239   < > = values in this interval not displayed.     Blood Culture    Component Value Date/Time   SDES TRACHEAL ASPIRATE 07/12/2021 0817   SPECREQUEST NONE 07/12/2021 0817   CULT PENDING 07/12/2021 0817   REPTSTATUS PENDING 07/12/2021 0817    Cardiac Enzymes: No results for input(s): CKTOTAL, CKMB, CKMBINDEX, TROPONINI in the last 168 hours. CBG: Recent Labs  Lab 07/12/21 1205 07/12/21 1614 07/12/21 1933 07/12/21 2318 07/13/21 0416  GLUCAP 210* 156* 109* 143* 182*    Iron Studies: No results for input(s): IRON, TIBC, TRANSFERRIN, FERRITIN in the last 72 hours. '@[email or web address]' @ Studies/Results: DG Chest 1 View  Result Date: 07/11/2021 CLINICAL DATA:  Central line placement. EXAM: CHEST  1 VIEW COMPARISON:  05/11/2021 FINDINGS: 1433 hours. The cardio pericardial silhouette is enlarged. Endotracheal tube tip is 5.2 cm above the base of the carina. Right IJ central line tip overlies the innominate vein confluence. Left IJ central line tip also positioned at the innominate vein confluence. NG tube tip is in the proximal stomach with side port just below the level of the GE junction. Bibasilar atelectasis noted, left greater than right. Left chest tube remains in place without evidence for left-sided pneumothorax. Probable midline mediastinal/pericardial drain. IMPRESSION: Cardiomegaly with left greater than right basilar atelectasis. No pneumothorax. Support apparatus as above. Electronically Signed   By: Misty Stanley M.D.   On: 07/11/2021 14:40   DG Abd 1 View  Result Date: 07/12/2021 CLINICAL DATA:  Enteric tube placement EXAM: ABDOMEN - 1 VIEW COMPARISON:  Abdomen radiographs done on 06/05/2020 and chest radiographs done on 07/11/2021 FINDINGS: Tip of enteric tube is seen in the region of fundus of the stomach. Side-port in the tube is at the gastroesophageal junction. There is no small bowel dilation. Stomach is not distended. There is moderate gaseous distention of transverse colon. Left lateral costophrenic angle is indistinct  suggesting pleural effusion. There is left chest tube with its tip in the lateral aspect of left lower lung fields. IMPRESSION: Tip of enteric tube is seen in the fundus of the stomach. Side-port in the tube is at the gastroesophageal junction. NG tube could be advanced 5-10 cm to place the tip and side-port within the stomach. Electronically Signed   By: Elmer Picker M.D.   On: 07/12/2021 12:07   ECHOCARDIOGRAM LIMITED  Result Date: 07/11/2021    ECHOCARDIOGRAM LIMITED REPORT   Patient Name:   Billy [name] Date of Exam: 07/11/2021 Medical Rec #:  [id number]      Height:       74.0 in Accession #:    [id number]     Weight:       267.4 lb Date of Birth:  01-Jun-1966      BSA:          2.459 m Patient Age:    55 years       BP:           109/55 mmHg Patient Gender: M              HR:           83 bpm. Exam Location:  Inpatient Procedure: Limited Echo and Intracardiac Opacification Agent Indications:    Evaluate EF at normal heart rate  History:        Patient has prior history of Echocardiogram examinations. CAD                 and Previous Myocardial Infarction, Prior CABG and Abnormal ECG,                 Arrythmias:SVT, Signs/Symptoms:Shortness of Breath and Chest                 Pain; Risk Factors:Hypertension and Diabetes. Cardiac shock.  Sonographer:    Merrie Roof RDCS Referring Phys: [place]  1. Left ventricular ejection fraction, by estimation, is 20 to 25%. The left ventricle has severely decreased function. The left ventricle demonstrates regional wall motion abnormalities (see scoring diagram/findings for description). There is severe akinesis of the left ventricular, mid-apical anteroseptal wall and apical segment. FINDINGS  Left Ventricle: Left ventricular ejection fraction, by estimation, is 20 to 25%. The left ventricle has severely decreased  function. The left ventricle demonstrates regional wall motion abnormalities. Severe akinesis of the left ventricular,  mid-apical anteroseptal wall and apical segment. Definity contrast agent was given IV to delineate the left ventricular endocardial borders.  LV Volumes (MOD) LV vol d, MOD A2C: 51.2 ml LV vol d, MOD A4C: 96.2 ml LV vol s, MOD A2C: 31.3 ml LV vol s, MOD A4C: 49.5 ml LV SV MOD A2C:     19.9 ml LV SV MOD A4C:     96.2 ml LV SV MOD BP:      26.8 ml Mertie Moores MD Electronically signed by Mertie Moores MD Signature Date/Time: 07/11/2021/6:02:39 PM    Final    Medications:   prismasol BGK 4/2.5 400 mL/hr at 07/13/21 0512    prismasol BGK 4/2.5 300 mL/hr at 07/13/21 7989   sodium chloride     sodium chloride Stopped (07/13/21 0234)   sodium chloride     albumin human 12.5 g (07/08/21 1827)   And   sodium chloride Stopped (07/08/21 2051)   ceFEPime (MAXIPIME) IV Stopped (07/12/21 2143)   dexmedetomidine (PRECEDEX) IV infusion 0.8 mcg/kg/hr (07/13/21 0800)   feeding supplement (VITAL 1.5 CAL) 20 mL/hr at 07/12/21 0850   fentaNYL infusion INTRAVENOUS 200 mcg/hr (07/13/21 0800)   heparin 1,700 Units/hr (07/13/21 0800)   lactated ringers     lactated ringers 30 mL/hr at 07/13/21 0800   lactated ringers     midazolam 10 mg/hr (07/13/21 0800)   norepinephrine (LEVOPHED) Adult infusion 8 mcg/min (07/13/21 0800)   prismasol BGK 4/2.5 1,500 mL/hr at 07/13/21 0512   promethazine (PHENERGAN) injection (IM or IVPB) Stopped (07/09/21 1628)    acetaminophen  1,000 mg Oral Q6H   Or   acetaminophen (TYLENOL) oral liquid 160 mg/5 mL  1,000 mg Per Tube Q6H   amiodarone  200 mg Per Tube BID   aspirin EC  81 mg Oral Daily   Or   aspirin  81 mg Per Tube Daily   atorvastatin  80 mg Oral Daily   B-complex with vitamin C  1 tablet Per Tube Daily   bisacodyl  10 mg Oral Daily   Or   bisacodyl  10 mg Rectal Daily   chlorhexidine gluconate (MEDLINE KIT)  15 mL Mouth Rinse BID   Chlorhexidine Gluconate Cloth  6 each Topical Daily   clonazePAM  2 mg Per Tube BID   clopidogrel  75 mg Per Tube Daily   docusate   100 mg Per Tube BID   insulin aspart  3 Units Subcutaneous Q4H   insulin aspart  3-9 Units Subcutaneous Q4H   mouth rinse  15 mL Mouth Rinse 10 times per day   metoCLOPramide (REGLAN) injection  5 mg Intravenous Q8H   multivitamin  15 mL Per Tube Daily   pantoprazole sodium  40 mg Per Tube Daily   polyethylene glycol  17 g Per Tube BID   sennosides  10 mL Per Tube BID   sodium chloride flush  10-40 mL Intracatheter Q12H   sodium chloride flush  3 mL Intravenous Q12H     Assessment/Plan: 55yo M DM, HTN, A fib, CKD currently being followed for severe AKI s/p CABG x 4 and MAZE.   **Severe AKI on CKD 3b:  Baseline 1.6 (Followed by Dr. Moshe Cipro outpt), was 1.9 prior to CABG and now AKI in setting of hemodynamic instability post op.  Initiated CRRT 10/27 for UF with ~anuria and volume overload.  Electrolytes, bicarb good today.  With CVP  6 will back off fluid removal to net even today.  Cont to CRRT but likely can transition to HD in the coming days with improving hemodynamics.  Too early to say if will need long term HD.    **Anemia: Hb 9s, transfuse PRN.    **Hyponatremia:resolved  **CAD s/p CABG  **A fib: amiodarone, anticoag.  **DM: per primary  Jannifer Hick MD 07/13/2021, 8:17 AM  [facility] Pager: [phone]

## 2021-07-13 NOTE — Progress Notes (Signed)
Patient ID: Billy [name], male   DOB: 1966/02/25, 55 y.o.   MRN: [id number]     Advanced Heart Failure Rounding Note  PCP-Cardiologist: None   Subjective:    10/25: S/P CABG x4 with MAZE . Intra Op EF 20%.  10/27: Intubated. Hypotensive. Placed on Norepi+ Vaso. Recurrent A fib with amio increased to 60 mg/hr. Remains on CRRT   He is in NSR today.  Currently on NE 8, off vasopressin.  Amiodarone gtt stopped and on po amiodarone now, HR 50s.   Remains intubated, but wakes up/follows commands.   CVVH ongoing, pulling around 100 cc/hr net negative.    FloTrac CI 2.4, co-ox 55%.  CVP 11-12.     Objective:   Weight Range: 121.3 kg Body mass index is 34.33 kg/m.   Vital Signs:   Temp:  [98.1 F (36.7 C)-99.4 F (37.4 C)] 98.2 F (36.8 C) (10/30 0427) Pulse Rate:  [58-69] 61 (10/30 0000) Resp:  [0-26] 18 (10/30 0300) BP: (97-126)/(51-74) 126/74 (10/30 0000) SpO2:  [96 %-100 %] 100 % (10/30 0409) Arterial Line BP: (100-163)/(49-69) 132/58 (10/30 0300) FiO2 (%):  [40 %] 40 % (10/30 0409) Last BM Date:  (PTA)  Weight change: Filed Weights   07/09/21 0407 07/10/21 0500 07/11/21 0500  Weight: 120.8 kg 119.4 kg 121.3 kg    Intake/Output:   Intake/Output Summary (Last 24 hours) at 07/13/2021 0754 Last data filed at 07/13/2021 0600 Gross per 24 hour  Intake 4637 ml  Output 6782 ml  Net -2145 ml      Physical Exam   General: Intubated/sedated.  Neck: JVP 10 cm, no thyromegaly or thyroid nodule.  Lungs: Decreased at bases CV: Nondisplaced PMI.  Heart regular S1/S2, no S3/S4, no murmur.  Trace ankle edema.  Abdomen: Soft, nontender, no hepatosplenomegaly, no distention.  Skin: Intact without lesions or rashes.  Neurologic: Awakens and follows commands.  Extremities: No clubbing or cyanosis.  HEENT: Normal.   Telemetry   NSR 50s, personally reviewed.   EKG    N/A   Labs    CBC Recent Labs    07/11/21 0413 07/11/21 2035 07/12/21 0426 07/13/21 0237  WBC  13.0*  --  11.0* 11.2*  NEUTROABS 10.3*  --   --   --   HGB 8.4*   < > 9.0* 9.0*  HCT 24.7*   < > 27.0* 27.4*  MCV 94.3  --  94.7 96.1  PLT 148*  --  196 239   < > = values in this interval not displayed.   Basic Metabolic Panel Recent Labs    07/12/21 0426 07/12/21 1446 07/13/21 0237  NA 135 136 135  K 4.6 4.6 4.6  CL 103 104 105  CO2 _0 GLUCOSE 197* 201* 186*  BUN 25* 25* 23*  CREATININE 3.94* 3.73* 3.44*  CALCIUM 7.9* 7.7* 7.8*  MG 2.5*  --  2.5*  PHOS 3.0 2.7 2.7   Liver Function Tests Recent Labs    07/10/21 0937 07/10/21 1555 07/12/21 1446 07/13/21 0237  AST 29  --   --   --   ALT 5  --   --   --   ALKPHOS 40  --   --   --   BILITOT 0.6  --   --   --   PROT 5.2*  --   --   --   ALBUMIN 2.3*   < > 1.9* 1.9*   < > = values in this interval not  displayed.   No results for input(s): LIPASE, AMYLASE in the last 72 hours. Cardiac Enzymes No results for input(s): CKTOTAL, CKMB, CKMBINDEX, TROPONINI in the last 72 hours.  BNP: BNP (last 3 results) No results for input(s): BNP in the last 8760 hours.  ProBNP (last 3 results) No results for input(s): PROBNP in the last 8760 hours.   D-Dimer No results for input(s): DDIMER in the last 72 hours. Hemoglobin A1C No results for input(s): HGBA1C in the last 72 hours.  Fasting Lipid Panel No results for input(s): CHOL, HDL, LDLCALC, TRIG, CHOLHDL, LDLDIRECT in the last 72 hours.  Thyroid Function Tests No results for input(s): TSH, T4TOTAL, T3FREE, THYROIDAB in the last 72 hours.  Invalid input(s): FREET3  Other results:   Imaging    DG Abd 1 View  Result Date: 07/12/2021 CLINICAL DATA:  Enteric tube placement EXAM: ABDOMEN - 1 VIEW COMPARISON:  Abdomen radiographs done on 06/05/2020 and chest radiographs done on 07/11/2021 FINDINGS: Tip of enteric tube is seen in the region of fundus of the stomach. Side-port in the tube is at the gastroesophageal junction. There is no small bowel dilation.  Stomach is not distended. There is moderate gaseous distention of transverse colon. Left lateral costophrenic angle is indistinct suggesting pleural effusion. There is left chest tube with its tip in the lateral aspect of left lower lung fields. IMPRESSION: Tip of enteric tube is seen in the fundus of the stomach. Side-port in the tube is at the gastroesophageal junction. NG tube could be advanced 5-10 cm to place the tip and side-port within the stomach. Electronically Signed   By: Elmer Picker M.D.   On: 07/12/2021 12:07     Medications:     Scheduled Medications:  acetaminophen  1,000 mg Oral Q6H   Or   acetaminophen (TYLENOL) oral liquid 160 mg/5 mL  1,000 mg Per Tube Q6H   amiodarone  200 mg Per Tube BID   aspirin EC  81 mg Oral Daily   Or   aspirin  81 mg Per Tube Daily   atorvastatin  80 mg Oral Daily   B-complex with vitamin C  1 tablet Per Tube Daily   bisacodyl  10 mg Oral Daily   Or   bisacodyl  10 mg Rectal Daily   chlorhexidine gluconate (MEDLINE KIT)  15 mL Mouth Rinse BID   Chlorhexidine Gluconate Cloth  6 each Topical Daily   clonazePAM  2 mg Per Tube BID   clopidogrel  75 mg Per Tube Daily   docusate  100 mg Per Tube BID   insulin aspart  3 Units Subcutaneous Q4H   insulin aspart  3-9 Units Subcutaneous Q4H   mouth rinse  15 mL Mouth Rinse 10 times per day   metoCLOPramide (REGLAN) injection  5 mg Intravenous Q8H   multivitamin  15 mL Per Tube Daily   pantoprazole sodium  40 mg Per Tube Daily   polyethylene glycol  17 g Per Tube BID   sennosides  10 mL Per Tube BID   sodium chloride flush  10-40 mL Intracatheter Q12H   sodium chloride flush  3 mL Intravenous Q12H    Infusions:   prismasol BGK 4/2.5 400 mL/hr at 07/13/21 0512    prismasol BGK 4/2.5 300 mL/hr at 07/13/21 0626   sodium chloride     sodium chloride Stopped (07/13/21 0234)   sodium chloride     albumin human 12.5 g (07/08/21 1827)   And   sodium chloride Stopped (  07/08/21 2051)    ceFEPime (MAXIPIME) IV Stopped (07/12/21 2143)   dexmedetomidine (PRECEDEX) IV infusion 0.8 mcg/kg/hr (07/13/21 0600)   feeding supplement (VITAL 1.5 CAL) 20 mL/hr at 07/12/21 0850   fentaNYL infusion INTRAVENOUS 200 mcg/hr (07/13/21 0600)   heparin 1,500 Units/hr (07/13/21 0600)   lactated ringers     lactated ringers 30 mL/hr at 07/13/21 0600   lactated ringers     midazolam 10 mg/hr (07/13/21 0620)   norepinephrine (LEVOPHED) Adult infusion 10 mcg/min (07/13/21 0600)   prismasol BGK 4/2.5 1,500 mL/hr at 07/13/21 0350   promethazine (PHENERGAN) injection (IM or IVPB) Stopped (07/09/21 1628)    PRN Medications: sodium chloride, albumin human **AND** sodium chloride, fentaNYL, heparin, heparin, lactated ringers, metoprolol tartrate, midazolam, midazolam, midazolam, ondansetron (ZOFRAN) IV, oxyCODONE, promethazine (PHENERGAN) injection (IM or IVPB), sodium chloride, sodium chloride flush, sodium chloride flush    Patient Profile   Mr Lurz is a 79 year with a history of visually impaired, DM, HTN, depression, anxiety takes 2 mg xanax 3 times a day, SVT, CKD Stage II, PAF, and CAD.Marland Kitchen   Anterior Stemi--> CABG--> cardiogenic shock.   Assessment/Plan   1. CAD: Admitted with anterolateral MI from occluded diagonal treated with DES.  Patient then had CABG with LIMA-LAD, SVG-D, SVG-PDA, and left radial-OM for complete revascularization.    - Continue ASA 81. Will likely drop this at discharge given need for Plavix and anticoagulation.  - Continue Plavix 75 daily.  - atorvastatin 80 mg daily.  - Amlodipine for radial harvest held with hypotension on pressors.    2. Atrial fibrillation: Paroxysmal, s/p Maze and appendage clipping.  He was in AF with RVR on 10/27, now back in NSR.  - Continue amiodarone 200 mg bid. - ECG (QTc looks prolonged).   - On heparin gtt, transition to Eliquis eventually.  3. AKI on CKD stage 3: Baseline creatinine around 1.6, up to 5 post-op.  He has likely had  multiple hits including contrast with initial cath/PCI and possible peri-operative hypotension.  Suspect ATN is present, will need to support him for now and hopefully function will eventually improve. He is on CVVH. CVP 12 today.  - Aim for net negative UF 100 cc/hr today.  4. Acute systolic CHF: Ischemic cardiomyopathy.  Echo prior to this admission with EF 50-55% (pre-MI and CABG).  Intraoperative TEE with EF 20%.  CVP 9-10 today, on NE 8 and off vasopression.   Limited echo on 10/28 showed minimal pericardial effusion, EF in 25% range. Co-ox 55% with but CI 2.3 off FloTrac.  - Slow wean of NE, at 8 currently.  - He will need CVVH for volume management, pulling net negative 100 cc/hr today.  5. Anemia: Post-op, stable.   6. ID: Afebrile today, WBCs 13 => 11.  Thick secretions.  - Continue empiric cefepime.  - trach aspirate culture pending.  7. Acute hypoxemic respiratory failure: Suspect primarily pulmonary edema, now intubated.  - Per CCM.  - Fluid removal via CVVH.  8. Thrombocytopenia: Resolved.   CRITICAL CARE Performed by: Loralie Champagne  Total critical care time: 35 minutes  Critical care time was exclusive of separately billable procedures and treating other patients.  Critical care was necessary to treat or prevent imminent or life-threatening deterioration.  Critical care was time spent personally by me on the following activities: development of treatment plan with patient and/or surrogate as well as nursing, discussions with consultants, evaluation of patient's response to treatment, examination of patient, obtaining history from patient  or surrogate, ordering and performing treatments and interventions, ordering and review of laboratory studies, ordering and review of radiographic studies, pulse oximetry and re-evaluation of patient's condition.  Loralie Champagne 07/13/2021 7:55 AM

## 2021-07-14 ENCOUNTER — Inpatient Hospital Stay (HOSPITAL_COMMUNITY): Payer: Medicare HMO

## 2021-07-14 DIAGNOSIS — R570 Cardiogenic shock: Secondary | ICD-10-CM | POA: Diagnosis not present

## 2021-07-14 DIAGNOSIS — I5043 Acute on chronic combined systolic (congestive) and diastolic (congestive) heart failure: Secondary | ICD-10-CM | POA: Diagnosis not present

## 2021-07-14 DIAGNOSIS — J9601 Acute respiratory failure with hypoxia: Secondary | ICD-10-CM | POA: Diagnosis not present

## 2021-07-14 DIAGNOSIS — N179 Acute kidney failure, unspecified: Secondary | ICD-10-CM | POA: Diagnosis not present

## 2021-07-14 LAB — POCT I-STAT 7, (LYTES, BLD GAS, ICA,H+H)
Acid-Base Excess: 0 mmol/L (ref 0.0–2.0)
Acid-Base Excess: 0 mmol/L (ref 0.0–2.0)
Acid-Base Excess: 0 mmol/L (ref 0.0–2.0)
Bicarbonate: 25 mmol/L (ref 20.0–28.0)
Bicarbonate: 25.5 mmol/L (ref 20.0–28.0)
Bicarbonate: 25.4 mmol/L (ref 20.0–28.0)
Calcium, Ion: 1.17 mmol/L (ref 1.15–1.40)
Calcium, Ion: 1.14 mmol/L — ABNORMAL LOW (ref 1.15–1.40)
Calcium, Ion: 1.12 mmol/L — ABNORMAL LOW (ref 1.15–1.40)
HCT: 37 % — ABNORMAL LOW (ref 39.0–52.0)
HCT: 25 % — ABNORMAL LOW (ref 39.0–52.0)
HCT: 24 % — ABNORMAL LOW (ref 39.0–52.0)
Hemoglobin: 12.6 g/dL — ABNORMAL LOW (ref 13.0–17.0)
Hemoglobin: 8.5 g/dL — ABNORMAL LOW (ref 13.0–17.0)
Hemoglobin: 8.2 g/dL — ABNORMAL LOW (ref 13.0–17.0)
O2 Saturation: 90 %
O2 Saturation: 95 %
O2 Saturation: 98 %
Patient temperature: 36.7
Patient temperature: 36.8
Patient temperature: 98.2
Potassium: 4.3 mmol/L (ref 3.5–5.1)
Potassium: 4.5 mmol/L (ref 3.5–5.1)
Potassium: 4.6 mmol/L (ref 3.5–5.1)
Sodium: 136 mmol/L (ref 135–145)
Sodium: 136 mmol/L (ref 135–145)
Sodium: 135 mmol/L (ref 135–145)
TCO2: 26 mmol/L (ref 22–32)
TCO2: 27 mmol/L (ref 22–32)
TCO2: 27 mmol/L (ref 22–32)
pCO2 arterial: 42.1 mmHg (ref 32.0–48.0)
pCO2 arterial: 44.5 mmHg (ref 32.0–48.0)
pCO2 arterial: 45.6 mmHg (ref 32.0–48.0)
pH, Arterial: 7.381 (ref 7.350–7.450)
pH, Arterial: 7.364 (ref 7.350–7.450)
pH, Arterial: 7.353 (ref 7.350–7.450)
pO2, Arterial: 59 mmHg — ABNORMAL LOW (ref 83.0–108.0)
pO2, Arterial: 76 mmHg — ABNORMAL LOW (ref 83.0–108.0)
pO2, Arterial: 107 mmHg (ref 83.0–108.0)

## 2021-07-14 LAB — RENAL FUNCTION PANEL
Albumin: 1.8 g/dL — ABNORMAL LOW (ref 3.5–5.0)
Albumin: 1.8 g/dL — ABNORMAL LOW (ref 3.5–5.0)
Anion gap: 9 (ref 5–15)
Anion gap: 8 (ref 5–15)
BUN: 22 mg/dL — ABNORMAL HIGH (ref 6–20)
BUN: 23 mg/dL — ABNORMAL HIGH (ref 6–20)
CO2: 23 mmol/L (ref 22–32)
CO2: 23 mmol/L (ref 22–32)
Calcium: 7.7 mg/dL — ABNORMAL LOW (ref 8.9–10.3)
Calcium: 7.5 mg/dL — ABNORMAL LOW (ref 8.9–10.3)
Chloride: 102 mmol/L (ref 98–111)
Chloride: 103 mmol/L (ref 98–111)
Creatinine, Ser: 2.95 mg/dL — ABNORMAL HIGH (ref 0.61–1.24)
Creatinine, Ser: 2.95 mg/dL — ABNORMAL HIGH (ref 0.61–1.24)
GFR, Estimated: 24 mL/min — ABNORMAL LOW (ref >60)
GFR, Estimated: 24 mL/min — ABNORMAL LOW (ref >60)
Glucose, Bld: 193 mg/dL — ABNORMAL HIGH (ref 70–99)
Glucose, Bld: 218 mg/dL — ABNORMAL HIGH (ref 70–99)
Phosphorus: 2.9 mg/dL (ref 2.5–4.6)
Phosphorus: 5.1 mg/dL — ABNORMAL HIGH (ref 2.5–4.6)
Potassium: 4.2 mmol/L (ref 3.5–5.1)
Potassium: 5.1 mmol/L (ref 3.5–5.1)
Sodium: 134 mmol/L — ABNORMAL LOW (ref 135–145)
Sodium: 134 mmol/L — ABNORMAL LOW (ref 135–145)

## 2021-07-14 LAB — CULTURE, RESPIRATORY W GRAM STAIN: Culture: NORMAL

## 2021-07-14 LAB — COOXEMETRY PANEL
Carboxyhemoglobin: 1 % (ref 0.5–1.5)
Carboxyhemoglobin: 1.4 % (ref 0.5–1.5)
Methemoglobin: 0.9 % (ref 0.0–1.5)
Methemoglobin: 1.1 % (ref 0.0–1.5)
O2 Saturation: 39.5 %
O2 Saturation: 49 %
Total hemoglobin: 11 g/dL — ABNORMAL LOW (ref 12.0–16.0)
Total hemoglobin: 8.9 g/dL — ABNORMAL LOW (ref 12.0–16.0)

## 2021-07-14 LAB — CBC
HCT: 26.1 % — ABNORMAL LOW (ref 39.0–52.0)
Hemoglobin: 8.3 g/dL — ABNORMAL LOW (ref 13.0–17.0)
MCH: 30.7 pg (ref 26.0–34.0)
MCHC: 31.8 g/dL (ref 30.0–36.0)
MCV: 96.7 fL (ref 80.0–100.0)
Platelets: 178 10*3/uL (ref 150–400)
RBC: 2.7 MIL/uL — ABNORMAL LOW (ref 4.22–5.81)
RDW: 13.2 % (ref 11.5–15.5)
WBC: 9.2 10*3/uL (ref 4.0–10.5)
nRBC: 0 % (ref 0.0–0.2)

## 2021-07-14 LAB — GLUCOSE, CAPILLARY
Glucose-Capillary: 171 mg/dL — ABNORMAL HIGH (ref 70–99)
Glucose-Capillary: 181 mg/dL — ABNORMAL HIGH (ref 70–99)
Glucose-Capillary: 175 mg/dL — ABNORMAL HIGH (ref 70–99)
Glucose-Capillary: 198 mg/dL — ABNORMAL HIGH (ref 70–99)
Glucose-Capillary: 187 mg/dL — ABNORMAL HIGH (ref 70–99)
Glucose-Capillary: 212 mg/dL — ABNORMAL HIGH (ref 70–99)

## 2021-07-14 LAB — TRIGLYCERIDES: Triglycerides: 240 mg/dL — ABNORMAL HIGH (ref <150)

## 2021-07-14 LAB — PROCALCITONIN: Procalcitonin: 4.67 ng/mL

## 2021-07-14 LAB — MAGNESIUM: Magnesium: 2.6 mg/dL — ABNORMAL HIGH (ref 1.7–2.4)

## 2021-07-14 MED ORDER — CLOPIDOGREL BISULFATE 75 MG PO TABS
75.0000 mg | ORAL_TABLET | Freq: Every day | ORAL | Status: DC
  Administered 2021-07-14 – 2021-07-18 (×5): 75 mg
  Filled 2021-07-14 (×5): qty 1

## 2021-07-14 MED ORDER — POLYETHYLENE GLYCOL 3350 17 G PO PACK
17.0000 g | PACK | Freq: Two times a day (BID) | ORAL | Status: DC
  Administered 2021-07-14 – 2021-07-16 (×5): 17 g
  Filled 2021-07-14 (×5): qty 1

## 2021-07-14 MED ORDER — ALBUTEROL SULFATE (2.5 MG/3ML) 0.083% IN NEBU
2.5000 mg | INHALATION_SOLUTION | RESPIRATORY_TRACT | Status: DC | PRN
  Administered 2021-07-14 – 2021-07-20 (×7): 2.5 mg via RESPIRATORY_TRACT
  Filled 2021-07-14 (×6): qty 3

## 2021-07-14 MED ORDER — ASPIRIN 81 MG PO CHEW
81.0000 mg | CHEWABLE_TABLET | Freq: Every day | ORAL | Status: DC
  Administered 2021-07-14 – 2021-07-17 (×4): 81 mg
  Filled 2021-07-14 (×4): qty 1

## 2021-07-14 MED ORDER — QUETIAPINE FUMARATE 25 MG PO TABS: 25.0000 mg | ORAL_TABLET | Freq: Two times a day (BID) | ORAL | Status: DC

## 2021-07-14 MED ORDER — PANTOPRAZOLE 2 MG/ML SUSPENSION
40.0000 mg | Freq: Every day | ORAL | Status: DC
  Administered 2021-07-14 – 2021-07-18 (×5): 40 mg
  Filled 2021-07-14 (×5): qty 20

## 2021-07-14 MED ORDER — BISACODYL 10 MG RE SUPP: 10.0000 mg | Freq: Every day | RECTAL | Status: DC

## 2021-07-14 MED ORDER — ACETYLCYSTEINE 20 % IN SOLN
4.0000 mL | RESPIRATORY_TRACT | Status: DC
  Filled 2021-07-14 (×2): qty 4

## 2021-07-14 MED ORDER — QUETIAPINE FUMARATE 25 MG PO TABS
25.0000 mg | ORAL_TABLET | Freq: Two times a day (BID) | ORAL | Status: DC
  Administered 2021-07-14 – 2021-07-16 (×4): 25 mg
  Filled 2021-07-14 (×5): qty 1

## 2021-07-14 MED ORDER — CLONAZEPAM 1 MG PO TABS
2.0000 mg | ORAL_TABLET | Freq: Two times a day (BID) | ORAL | Status: DC
  Administered 2021-07-14 (×2): 2 mg
  Filled 2021-07-14 (×2): qty 2

## 2021-07-14 MED ORDER — AMIODARONE HCL 200 MG PO TABS
200.0000 mg | ORAL_TABLET | Freq: Two times a day (BID) | ORAL | Status: DC
  Administered 2021-07-14 – 2021-07-15 (×2): 200 mg
  Filled 2021-07-14 (×3): qty 1

## 2021-07-14 MED ORDER — B COMPLEX-C PO TABS
1.0000 | ORAL_TABLET | Freq: Every day | ORAL | Status: DC
  Administered 2021-07-14 – 2021-07-18 (×4): 1
  Filled 2021-07-14 (×4): qty 1

## 2021-07-14 MED ORDER — ATORVASTATIN CALCIUM 80 MG PO TABS
80.0000 mg | ORAL_TABLET | Freq: Every day | ORAL | Status: DC
  Administered 2021-07-14 – 2021-07-16 (×3): 80 mg via ORAL
  Filled 2021-07-14 (×3): qty 1

## 2021-07-14 MED ORDER — VITAL 1.5 CAL PO LIQD: 1000.0000 mL | ORAL | Status: DC

## 2021-07-14 MED ORDER — DOBUTAMINE IN D5W 4-5 MG/ML-% IV SOLN
2.5000 ug/kg/min | INTRAVENOUS | Status: DC
  Administered 2021-07-14 – 2021-07-16 (×2): 2.5 ug/kg/min via INTRAVENOUS
  Filled 2021-07-14 (×2): qty 250

## 2021-07-14 MED ORDER — ASPIRIN EC 81 MG PO TBEC
81.0000 mg | DELAYED_RELEASE_TABLET | Freq: Every day | ORAL | Status: DC
  Administered 2021-07-18: 81 mg via ORAL
  Filled 2021-07-14 (×2): qty 1

## 2021-07-14 MED ORDER — ALBUTEROL SULFATE (2.5 MG/3ML) 0.083% IN NEBU
INHALATION_SOLUTION | RESPIRATORY_TRACT | Status: AC
  Filled 2021-07-14: qty 3

## 2021-07-14 MED ORDER — ENOXAPARIN SODIUM 30 MG/0.3ML IJ SOSY: 30.0000 mg | PREFILLED_SYRINGE | Freq: Two times a day (BID) | INTRAMUSCULAR | Status: DC

## 2021-07-14 MED ORDER — DOCUSATE SODIUM 50 MG/5ML PO LIQD
100.0000 mg | Freq: Two times a day (BID) | ORAL | Status: DC
  Administered 2021-07-14 (×2): 100 mg
  Filled 2021-07-14 (×2): qty 10

## 2021-07-14 MED ORDER — PROSOURCE TF PO LIQD
90.0000 mL | Freq: Four times a day (QID) | ORAL | Status: DC
  Administered 2021-07-14 – 2021-07-16 (×9): 90 mL
  Filled 2021-07-14 (×9): qty 90

## 2021-07-14 MED ORDER — ADULT MULTIVITAMIN LIQUID CH
15.0000 mL | Freq: Every day | ORAL | Status: DC
  Administered 2021-07-14 – 2021-07-17 (×4): 15 mL
  Filled 2021-07-14 (×4): qty 15

## 2021-07-14 MED ORDER — ACETYLCYSTEINE 20 % IN SOLN
4.0000 mL | RESPIRATORY_TRACT | Status: DC
  Administered 2021-07-14 – 2021-07-15 (×6): 4 mL via RESPIRATORY_TRACT
  Filled 2021-07-14 (×7): qty 4

## 2021-07-14 MED ORDER — VITAL 1.5 CAL PO LIQD
1000.0000 mL | ORAL | Status: DC
  Administered 2021-07-14 – 2021-07-16 (×3): 1000 mL

## 2021-07-14 MED ORDER — BISACODYL 5 MG PO TBEC
10.0000 mg | DELAYED_RELEASE_TABLET | Freq: Every day | ORAL | Status: DC
  Filled 2021-07-14: qty 2

## 2021-07-14 MED ORDER — SENNOSIDES 8.8 MG/5ML PO SYRP
10.0000 mL | ORAL_SOLUTION | Freq: Two times a day (BID) | ORAL | Status: DC
  Administered 2021-07-14 – 2021-07-16 (×5): 10 mL
  Filled 2021-07-14 (×5): qty 10

## 2021-07-14 NOTE — Progress Notes (Signed)
Nutrition Follow-up  DOCUMENTATION CODES:   Not applicable  INTERVENTION:   Tube feeding via post-pyloric Cortrak: - Start Vital 1.5 @ 20 ml/hr and advance by 10 ml q 6 hours to goal rate of 55 ml/hr (1320 ml/day) - ProSource TF 90 ml QID  Tube feeding regimen at goal provides 2300 kcal, 177 grams of protein, and 1008 ml of H2O.   - Continue B-complex with vitamin C to account for losses with CRRT  NUTRITION DIAGNOSIS:   Increased nutrient needs related to acute illness (AKI requiring CRRT) as evidenced by estimated needs.  Ongoing, being addressed via TF  GOAL:   Patient will meet greater than or equal to 90% of their needs  Met via TF at goal  MONITOR:   Vent status, Labs, Weight trends, TF tolerance, I & O's  REASON FOR ASSESSMENT:   Ventilator, Consult Enteral/tube feeding initiation and management  ASSESSMENT:   55 year old male who presented to the ED on 10/24 with STEMI. PMH of atrial fibrillation, HTN, T2DM, depression, anxiety, CKD stage II.  10/24 - s/p LHC with PCI, found to have severe multivessel CAD 10/25 - s/p CABG x 4, Maze 10/26 - extubated 10/27 - intubated, CRRT start, trickle tube feeds started 10/30 - trickle tube feeds on hold due to concern for reflux of tube feeds 10/31 - Cortrak placed (tip post-pyloric), trickle tube feeds restarted  Pt remains on CRRT. Post-pyloric Cortrak placed today. Discussed pt with RN and CCM MD. Per CCM, okay to advance tube feeds to goal regimen via post-pyloric Cortrak.  Admit weight: 115.7 kg Current weight: 119.8 kg  Patient is currently intubated on ventilator support MV: 10.1 L/min Temp (24hrs), Avg:96.5 F (35.8 C), Min:91.4 F (33 C), Max:98.2 F (36.8 C) BP (a-line): 119/59 MA: (a-line): 76  Drips: Propofol: 29.1 ml/hr (provides 768 kcal daily from lipid) Fentanyl Dobutamine LR: 15 ml/hr Levophed  Medications reviewed and include: B-complex with vitamin C, dulcolax, colace, SSI q 4 hours,  novolog 3-9 units q 4 hours, IV reglan 5 mg q 8 hours, liquid MVI, protonix, miralax, senokot, IV abx  Labs reviewed: BUN 22, creatinine 2.95, ionized calcium 1.12, magnesium 2.6, TG 240, hemoglobin 8.2 CBG's: 120-194 x 24 hours  CRRT UF: 2634 ml x 24 hours Stool: 800 ml + 3 unmeasured occurrences x 24 hours CT: 15 ml x 24 hours I/O's: +2.3 L since admit  Diet Order:   Diet Order             Diet NPO time specified  Diet effective now                   EDUCATION NEEDS:   Not appropriate for education at this time  Skin:  Skin Assessment: Skin Integrity Issues: Incisions: chest, L leg, R arm  Last BM:  07/13/21 multiple large type 7  Height:   Ht Readings from Last 1 Encounters:  07/14/21 _0  (1.88 m)    Weight:   Wt Readings from Last 1 Encounters:  07/14/21 119.8 kg    Ideal Body Weight:  86.4 kg  BMI:  Body mass index is 33.91 kg/m.  Estimated Nutritional Needs:   Kcal:  2100-2300  Protein:  170-190 grams  Fluid:  >/= 2.0 L    Gustavus Bryant, MS, RD, LDN Inpatient Clinical Dietitian Please see AMiON for contact information.

## 2021-07-14 NOTE — Progress Notes (Signed)
Patient ID: Billy [name], male   DOB: 06-10-1966, 55 y.o.   MRN: [id number] P    Advanced Heart Failure Rounding Note  PCP-Cardiologist: None   Subjective:    10/25: S/P CABG x4 with MAZE . Intra Op EF 20%.  10/27: Intubated. Hypotensive. Placed on Norepi+ Vaso. Recurrent A fib with amio increased to 60 mg/hr. Remains on CRRT   He is in NSR today on po amiodarone.  Off heparin gtt, back on [place] heparin.  Currently on NE 7, off vasopressin.  Co-ox low at 49%.   Afebrile but with rigors this morning.  On cefepime. Trach aspirate culture NGTD.   Remains intubated, but wakes up/follows commands.   CVVH ongoing, pulling even.  CVP 10.  FiO2 had to be increased last night with hypoxemia.  CXR with bibasilar infiltrates.     Objective:   Weight Range: 119.8 kg Body mass index is 33.91 kg/m.   Vital Signs:   Temp:  [91.4 F (33 C)-98.2 F (36.8 C)] 98.2 F (36.8 C) (10/31 0600) Pulse Rate:  [51-64] 60 (10/31 0742) Resp:  [0-21] 21 (10/31 0742) BP: (100-132)/(58-69) 100/64 (10/31 0000) SpO2:  [89 %-100 %] 94 % (10/31 0742) Arterial Line BP: (94-164)/(43-74) 124/55 (10/31 0700) FiO2 (%):  [30 %-50 %] 50 % (10/31 0742) Weight:  [119.8 kg] 119.8 kg (10/31 0600) Last BM Date: 07/13/21  Weight change: Filed Weights   07/10/21 0500 07/11/21 0500 07/14/21 0600  Weight: 119.4 kg 121.3 kg 119.8 kg    Intake/Output:   Intake/Output Summary (Last 24 hours) at 07/14/2021 0753 Last data filed at 07/14/2021 0700 Gross per 24 hour  Intake 2166.7 ml  Output 3449 ml  Net -1282.3 ml      Physical Exam   General: NAD.  Intubated/sedated.  Neck: JVP 10 cm, no thyromegaly or thyroid nodule.  Lungs: Clear to auscultation bilaterally with normal respiratory effort. CV: Nondisplaced PMI.  Heart regular S1/S2, no S3/S4, no murmur.  1+ ankle edema.  Abdomen: Soft, nontender, no hepatosplenomegaly, no distention.  Skin: Intact without lesions or rashes.  Neurologic: Wakes up, follows  commands.  Extremities: No clubbing or cyanosis.  HEENT: Normal.    Telemetry   NSR 60s, personally reviewed.   EKG    N/A   Labs    CBC Recent Labs    07/13/21 0237 07/14/21 0326 07/14/21 0358 07/14/21 0639 07/14/21 0744  WBC 11.2* 9.2  --   --   --   HGB 9.0* 8.3*   < > 8.5* 8.2*  HCT 27.4* 26.1*   < > 25.0* 24.0*  MCV 96.1 96.7  --   --   --   PLT 239 178  --   --   --    < > = values in this interval not displayed.   Basic Metabolic Panel Recent Labs    07/13/21 0237 07/13/21 1704 07/14/21 0326 07/14/21 0358 07/14/21 0639 07/14/21 0744  NA 135 135 134*   < > 136 135  K 4.6 4.4 4.2   < > 4.5 4.6  CL 105 104 102  --   --   --   CO2 _0 --   --   --   GLUCOSE 186* 134* 193*  --   --   --   BUN 23* 22* 22*  --   --   --   CREATININE 3.44* 3.11* 2.95*  --   --   --   CALCIUM 7.8* 7.7* 7.7*  --   --   --  MG 2.5*  --  2.6*  --   --   --   PHOS 2.7 3.3 2.9  --   --   --    < > = values in this interval not displayed.   Liver Function Tests Recent Labs    07/13/21 1704 07/14/21 0326  ALBUMIN 1.9* 1.8*   No results for input(s): LIPASE, AMYLASE in the last 72 hours. Cardiac Enzymes No results for input(s): CKTOTAL, CKMB, CKMBINDEX, TROPONINI in the last 72 hours.  BNP: BNP (last 3 results) No results for input(s): BNP in the last 8760 hours.  ProBNP (last 3 results) No results for input(s): PROBNP in the last 8760 hours.   D-Dimer No results for input(s): DDIMER in the last 72 hours. Hemoglobin A1C No results for input(s): HGBA1C in the last 72 hours.  Fasting Lipid Panel Recent Labs    07/14/21 0326  TRIG 240*    Thyroid Function Tests No results for input(s): TSH, T4TOTAL, T3FREE, THYROIDAB in the last 72 hours.  Invalid input(s): FREET3  Other results:   Imaging    DG CHEST PORT 1 VIEW  Result Date: 07/14/2021 CLINICAL DATA:  ARDS EXAM: PORTABLE CHEST 1 VIEW COMPARISON:  Yesterday FINDINGS: Endotracheal tube with tip  just below the clavicular heads. Dialysis catheter and central line with tips at the upper SVC. The enteric tube at least reaches the stomach. CABG and left atrial clipping. There is a drain over the central chest. Hazy appearance of the bilateral chest from atelectasis and pleural fluid primarily. No pneumothorax. IMPRESSION: 1. Stable hardware positioning. 2. Similar degree of hazy chest opacity from atelectasis and pleural fluid. Electronically Signed   By: Jorje Guild M.D.   On: 07/14/2021 06:12     Medications:     Scheduled Medications:  amiodarone  200 mg Per Tube BID   aspirin EC  81 mg Oral Daily   Or   aspirin  81 mg Per Tube Daily   atorvastatin  80 mg Oral Daily   B-complex with vitamin C  1 tablet Per Tube Daily   bisacodyl  10 mg Oral Daily   Or   bisacodyl  10 mg Rectal Daily   chlorhexidine gluconate (MEDLINE KIT)  15 mL Mouth Rinse BID   Chlorhexidine Gluconate Cloth  6 each Topical Daily   clonazePAM  2 mg Per Tube BID   clopidogrel  75 mg Per Tube Daily   docusate  100 mg Per Tube BID   heparin injection (subcutaneous)  5,000 Units Subcutaneous Q8H   insulin aspart  3 Units Subcutaneous Q4H   insulin aspart  3-9 Units Subcutaneous Q4H   mouth rinse  15 mL Mouth Rinse 10 times per day   metoCLOPramide (REGLAN) injection  5 mg Intravenous Q8H   multivitamin  15 mL Per Tube Daily   pantoprazole sodium  40 mg Per Tube Daily   polyethylene glycol  17 g Per Tube BID   sennosides  10 mL Per Tube BID   sodium chloride flush  10-40 mL Intracatheter Q12H   sodium chloride flush  3 mL Intravenous Q12H    Infusions:   prismasol BGK 4/2.5 400 mL/hr at 07/14/21 0629    prismasol BGK 4/2.5 300 mL/hr at 07/14/21 0981   sodium chloride     sodium chloride Stopped (07/13/21 0234)   sodium chloride     albumin human 12.5 g (07/08/21 1827)   And   sodium chloride Stopped (07/08/21 2051)   ceFEPime (MAXIPIME)  IV 2 g (07/13/21 2203)   dexmedetomidine (PRECEDEX) IV  infusion Stopped (07/13/21 1923)   fentaNYL infusion INTRAVENOUS 200 mcg/hr (07/14/21 0700)   lactated ringers     lactated ringers 15 mL/hr at 07/14/21 0700   lactated ringers     midazolam Stopped (07/13/21 1148)   norepinephrine (LEVOPHED) Adult infusion 7 mcg/min (07/14/21 0700)   prismasol BGK 4/2.5 1,500 mL/hr at 07/14/21 2836   promethazine (PHENERGAN) injection (IM or IVPB) Stopped (07/09/21 1628)   propofol (DIPRIVAN) infusion 50 mcg/kg/min (07/14/21 0700)    PRN Medications: sodium chloride, albumin human **AND** sodium chloride, fentaNYL, heparin, heparin, lactated ringers, metoprolol tartrate, midazolam, midazolam, ondansetron (ZOFRAN) IV, oxyCODONE, promethazine (PHENERGAN) injection (IM or IVPB), sodium chloride, sodium chloride flush, sodium chloride flush    Patient Profile   Mr Slauson is a 89 year with a history of visually impaired, DM, HTN, depression, anxiety takes 2 mg xanax 3 times a day, SVT, CKD Stage II, PAF, and CAD.Marland Kitchen   Anterior Stemi--> CABG--> cardiogenic shock.   Assessment/Plan   1. CAD: Admitted with anterolateral MI from occluded diagonal treated with DES.  Patient then had CABG with LIMA-LAD, SVG-D, SVG-PDA, and left radial-OM for complete revascularization.    - Continue ASA 81. Will likely drop this at discharge given need for Plavix and anticoagulation.  - Continue Plavix 75 daily.  - atorvastatin 80 mg daily.  - Amlodipine for radial harvest held with hypotension on pressors.    2. Atrial fibrillation: Paroxysmal, s/p Maze and appendage clipping.  He was in AF with RVR on 10/27, now back in NSR.  - Continue amiodarone 200 mg bid. - On heparin [place], ideally to apixaban eventually.   3. AKI on CKD stage 3: Baseline creatinine around 1.6, up to 5 post-op.  He has likely had multiple hits including contrast with initial cath/PCI and possible peri-operative hypotension.  Suspect ATN is present, will need to support him for now and hopefully function  will eventually improve. He is on CVVH. CVP 10-11 today.  - Aim for net negative UF 50-100 cc/hr today.  4. Acute systolic CHF: Ischemic cardiomyopathy.  Echo prior to this admission with EF 50-55% (pre-MI and CABG).  Intraoperative TEE with EF 20%.  CVP 10-11 today, on NE 7 and off vasopression.   Limited echo on 10/28 showed minimal pericardial effusion, EF in 25% range. Co-ox 49% today, low.  More hypoxemic overnight, increased FiO2 to 0.5.  - With low co-ox, add dobutamine 2.5 mcg/kg/min.  Can continue to wean down NE as MAP tolerates.   - Increased oxygen requirement and CVP 10-11, would pull UF net negative 50-100 cc/hr today.  5. Anemia: Post-op, transfuse < 8.   6. ID: Afebrile today but with rigors, WBCs 13 => 11 => 9.  Thick secretions. Trach aspirate culture negative.  CXR with bibasilar opacities.  - Continue empiric cefepime.  - Send procalcitonin.  7. Acute hypoxemic respiratory failure: Suspect primarily pulmonary edema, now intubated.  Possible PNA with bibasilar lung opacities.  - Per CCM.  - Fluid removal via CVVH.  - He is on cefepime.  8. Thrombocytopenia: Resolved.   CRITICAL CARE Performed by: Loralie Champagne  Total critical care time: 35 minutes  Critical care time was exclusive of separately billable procedures and treating other patients.  Critical care was necessary to treat or prevent imminent or life-threatening deterioration.  Critical care was time spent personally by me on the following activities: development of treatment plan with patient and/or surrogate as  well as nursing, discussions with consultants, evaluation of patient's response to treatment, examination of patient, obtaining history from patient or surrogate, ordering and performing treatments and interventions, ordering and review of laboratory studies, ordering and review of radiographic studies, pulse oximetry and re-evaluation of patient's condition.  Loralie Champagne 07/14/2021 7:53 AM

## 2021-07-14 NOTE — Progress Notes (Signed)
[name]       [place] [address]             [phone]                 6 Days Post-Op Procedure(s) (LRB): CORONARY ARTERY BYPASS GRAFTING (CABG) TIMES FOUR USING LEFT INTERNAL MAMMARY ARTERY, GREATER SAPHENOUS VEIN HARVESTED ENDOSCOPICALLY, AND LEFT RADIAL ARTERY HARVESTED OPEN (N/A) TRANSESOPHAGEAL ECHOCARDIOGRAM (TEE) (N/A) RADIAL ARTERY HARVEST (Left) ENDOVEIN HARVEST OF GREATER SAPHENOUS VEIN (Right) MAZE   Events: No events  _______________________________________________________________ Vitals: BP 100/64   Pulse (!) 58   Temp 98.2 F (36.8 C) (Oral)   Resp 20   Ht 6\' 2"  (1.88 m)   Wt 119.8 kg   SpO2 95%   BMI 33.91 kg/m  Filed Weights   07/10/21 0500 07/11/21 0500 07/14/21 0600  Weight: 119.4 kg 121.3 kg 119.8 kg     - Neuro: sedated, but arousable  - Cardiovascular: sinus  Drips: levo 7.  CVP:  [3 mmHg-23 mmHg] 12 mmHg  - Pulm:  Vent Mode: PRVC FiO2 (%):  [30 %-50 %] 50 % Set Rate:  [20 bmp] 20 bmp Vt Set:  [550 mL] 550 mL PEEP:  [5 cmH20] 5 cmH20 Plateau Pressure:  [17 cmH20-19 cmH20] 19 cmH20  ABG    Component Value Date/Time   PHART 7.364 07/14/2021 0639   PCO2ART 44.5 07/14/2021 0639   PO2ART 76 (L) 07/14/2021 0639   HCO3 25.5 07/14/2021 0639   TCO2 27 07/14/2021 0639   ACIDBASEDEF 9.0 (H) 07/10/2021 0303   O2SAT 95.0 07/14/2021 0639    - Abd: Soft, distended - Extremity: warm  .Intake/Output      10/30 0701 10/31 0700 10/31 0701 11/01 0700   I.V. (mL/kg) 2061 (17.2)    NG/GT     IV Piggyback 105.7    Total Intake(mL/kg) 2166.7 (18.1)    Urine (mL/kg/hr)     Other 2634    Stool 800    Chest Tube 15    Total Output 3449    Net -1282.3         Stool Occurrence 3 x       _______________________________________________________________ Labs: CBC Latest Ref Rng & Units 07/14/2021 07/14/2021 07/14/2021  WBC 4.0 - 10.5 K/uL - - 9.2  Hemoglobin 13.0 - 17.0 g/dL 8.5(L) 12.6(L) 8.3(L)  Hematocrit  39.0 - 52.0 % 25.0(L) 37.0(L) 26.1(L)  Platelets 150 - 400 K/uL - - 178   CMP Latest Ref Rng & Units 07/14/2021 07/14/2021 07/14/2021  Glucose 70 - 99 mg/dL - - 193(H)  BUN 6 - 20 mg/dL - - 22(H)  Creatinine 0.61 - 1.24 mg/dL - - 2.95(H)  Sodium 135 - 145 mmol/L 136 136 134(L)  Potassium 3.5 - 5.1 mmol/L 4.5 4.3 4.2  Chloride 98 - 111 mmol/L - - 102  CO2 22 - 32 mmol/L - - 23  Calcium 8.9 - 10.3 mg/dL - - 7.7(L)  Total Protein 6.5 - 8.1 g/dL - - -  Total Bilirubin 0.3 - 1.2 mg/dL - - -  Alkaline Phos 38 - 126 U/L - - -  AST 15 - 41 U/L - - -  ALT 0 - 44 U/L - - -    CXR: -Atelectasis and pulmonary vascular congestion.  _______________________________________________________________  Assessment and Plan: POD 6 s/p emergency CABG  Neuro: maintain sedation.  Better sedation on propofol CV: on levo.  We will start dobutamine.  On amio PO  Remains  in sinus,  Pulm: continue vent support.  Sats is worsening.  We will attempt to pull off more fluid.  Adding Mucomyst for thick secretions Renal: aneuric.  On CRRT.  Will attempt to pull more fluid. GI: Multiple bowel movements overnight.  Will resume trickle feeds Heme: stable,  ID: afebrile.  We will stop antibiotics today. Endo: SSI Dispo: continue ICU care.   Lajuana Matte 07/14/2021 7:38 AM

## 2021-07-14 NOTE — Progress Notes (Signed)
[facility] Progress Note Patient Name: AEDYN KEMPFER DOB: 27-Sep-1965 MRN: [id number]   Date of Service  07/14/2021  HPI/Events of Note  ABG reviewed, PO2 59 on FiO2 of 30 % and PEEP of 5, no evidence of dyssynchrony.  eICU Interventions  Stat portable CXR ordered to r/o pneumothorax or other issues, FiO2 increased to 50 % temporarily, if CXR is negative for pneumothorax will increase PEEP to 8, and wean FiO2 based on response.        Kerry Kass Bryann Mcnealy 07/14/2021, 4:54 AM

## 2021-07-14 NOTE — Progress Notes (Signed)
[facility] Progress Note   Subjective:  Continues on norepinephrine. Intubated but follows some commands. Remaining even on CRRT. Anuric.  Objective Vitals:   07/14/21 0600 07/14/21 0700 07/14/21 0742 07/14/21 0749  BP:      Pulse: 64 (!) 58 60   Resp: 20 20 (!) 21   Temp: 98.2 F (36.8 C)   97.7 F (36.5 C)  TempSrc: Oral   Oral  SpO2: 92% 95% 94%   Weight: 119.8 kg     Height:       Physical Exam General: intubated, sedated Heart: normal rate Lungs: coarse on vent, bilateral chest rise Abdomen: soft, nontender Extremities: trace edema  Additional Objective Labs: Basic Metabolic Panel: Recent Labs  Lab 07/13/21 0237 07/13/21 1704 07/14/21 0326 07/14/21 0358 07/14/21 0639 07/14/21 0744  NA 135 135 134* 136 136 135  K 4.6 4.4 4.2 4.3 4.5 4.6  CL 105 104 102  --   --   --   CO2 '25 25 23  ' --   --   --   GLUCOSE 186* 134* 193*  --   --   --   BUN 23* 22* 22*  --   --   --   CREATININE 3.44* 3.11* 2.95*  --   --   --   CALCIUM 7.8* 7.7* 7.7*  --   --   --   PHOS 2.7 3.3 2.9  --   --   --    Liver Function Tests: Recent Labs  Lab 07/07/21 1353 07/10/21 0937 07/10/21 1555 07/13/21 0237 07/13/21 1704 07/14/21 0326  AST 113* 29  --   --   --   --   ALT 24 5  --   --   --   --   ALKPHOS 38 40  --   --   --   --   BILITOT 1.0 0.6  --   --   --   --   PROT 6.1* 5.2*  --   --   --   --   ALBUMIN 3.3* 2.3*   < > 1.9* 1.9* 1.8*   < > = values in this interval not displayed.   No results for input(s): LIPASE, AMYLASE in the last 168 hours. CBC: Recent Labs  Lab 07/07/21 1353 07/08/21 0115 07/10/21 0409 07/11/21 0413 07/11/21 2035 07/12/21 0426 07/13/21 0237 07/14/21 0326 07/14/21 0358 07/14/21 0639 07/14/21 0744  WBC 9.6   < > 16.3* 13.0*  --  11.0* 11.2* 9.2  --   --   --   NEUTROABS 6.6  --   --  10.3*  --   --   --   --   --   --   --   HGB 14.0   < > 8.9* 8.4*   < > 9.0* 9.0* 8.3* 12.6* 8.5* 8.2*  HCT 40.6   < > 25.8* 24.7*   < >  27.0* 27.4* 26.1* 37.0* 25.0* 24.0*  MCV 92.7   < > 92.5 94.3  --  94.7 96.1 96.7  --   --   --   PLT 205   < > 213 148*  --  196 239 178  --   --   --    < > = values in this interval not displayed.   Blood Culture    Component Value Date/Time   SDES TRACHEAL ASPIRATE 07/12/2021 [place] 07/12/2021 0817   CULT  07/12/2021 0817  CULTURE REINCUBATED FOR BETTER GROWTH Performed at [facility], [place] [address] [place],  [address]    REPTSTATUS PENDING 07/12/2021 5993    Cardiac Enzymes: No results for input(s): CKTOTAL, CKMB, CKMBINDEX, TROPONINI in the last 168 hours. CBG: Recent Labs  Lab 07/13/21 1529 07/13/21 1928 07/13/21 2341 07/14/21 0347 07/14/21 0751  GLUCAP 120* 123* 194* 171* 181*   Iron Studies: No results for input(s): IRON, TIBC, TRANSFERRIN, FERRITIN in the last 72 hours. '@lablastinr3' @ Studies/Results: DG CHEST PORT 1 VIEW  Result Date: 07/14/2021 CLINICAL DATA:  ARDS EXAM: PORTABLE CHEST 1 VIEW COMPARISON:  Yesterday FINDINGS: Endotracheal tube with tip just below the clavicular heads. Dialysis catheter and central line with tips at the upper SVC. The enteric tube at least reaches the stomach. CABG and left atrial clipping. There is a drain over the central chest. Hazy appearance of the bilateral chest from atelectasis and pleural fluid primarily. No pneumothorax. IMPRESSION: 1. Stable hardware positioning. 2. Similar degree of hazy chest opacity from atelectasis and pleural fluid. Electronically Signed   By: Jorje Guild M.D.   On: 07/14/2021 06:12   DG CHEST PORT 1 VIEW  Result Date: 07/13/2021 CLINICAL DATA:  Difficulty breathing, pneumonia EXAM: PORTABLE CHEST 1 VIEW COMPARISON:  Previous studies including the examination of 07/11/2021 FINDINGS: Transverse diameter of heart is increased. There are no signs alveolar pulmonary edema. There is increased haziness in both lower lung fields. There is blunting of both lateral CP  angles. Left chest tube is noted in the lateral aspect of left lower lung fields. There is no pneumothorax. Tip endotracheal tube is 3.8 cm above the carina. There is no change in position of central venous catheters and enteric tube. IMPRESSION: There is increased haziness in both lower lung fields which may be due to pleural effusions and possibly underlying atelectasis/pneumonitis. Electronically Signed   By: Elmer Picker M.D.   On: 07/13/2021 12:09   Medications:   prismasol BGK 4/2.5 400 mL/hr at 07/14/21 0629    prismasol BGK 4/2.5 300 mL/hr at 07/14/21 5701   sodium chloride     sodium chloride Stopped (07/13/21 0234)   sodium chloride     albumin human 12.5 g (07/08/21 1827)   And   sodium chloride Stopped (07/08/21 2051)   ceFEPime (MAXIPIME) IV 2 g (07/13/21 2203)   DOBUTamine 2.5 mcg/kg/min (07/14/21 1000)   feeding supplement (VITAL 1.5 CAL)     fentaNYL infusion INTRAVENOUS 200 mcg/hr (07/14/21 1000)   lactated ringers     lactated ringers 15 mL/hr at 07/14/21 1000   lactated ringers     norepinephrine (LEVOPHED) Adult infusion 6 mcg/min (07/14/21 1000)   prismasol BGK 4/2.5 1,500 mL/hr at 07/14/21 0629   promethazine (PHENERGAN) injection (IM or IVPB) Stopped (07/09/21 1628)   propofol (DIPRIVAN) infusion 40 mcg/kg/min (07/14/21 1000)    acetylcysteine  4 mL Nebulization Q4H   amiodarone  200 mg Per Tube BID   aspirin EC  81 mg Oral Daily   Or   aspirin  81 mg Per Tube Daily   atorvastatin  80 mg Oral Daily   B-complex with vitamin C  1 tablet Per Tube Daily   bisacodyl  10 mg Oral Daily   Or   bisacodyl  10 mg Rectal Daily   chlorhexidine gluconate (MEDLINE KIT)  15 mL Mouth Rinse BID   Chlorhexidine Gluconate Cloth  6 each Topical Daily   clonazePAM  2 mg Per Tube BID   clopidogrel  75  mg Per Tube Daily   docusate  100 mg Per Tube BID   heparin injection (subcutaneous)  5,000 Units Subcutaneous Q8H   insulin aspart  3 Units Subcutaneous Q4H   insulin  aspart  3-9 Units Subcutaneous Q4H   mouth rinse  15 mL Mouth Rinse 10 times per day   metoCLOPramide (REGLAN) injection  5 mg Intravenous Q8H   multivitamin  15 mL Per Tube Daily   pantoprazole sodium  40 mg Per Tube Daily   polyethylene glycol  17 g Per Tube BID   QUEtiapine  25 mg Per Tube BID   sennosides  10 mL Per Tube BID   sodium chloride flush  10-40 mL Intracatheter Q12H   sodium chloride flush  3 mL Intravenous Q12H     Assessment/Plan: 55yo M DM, HTN, A fib, CKD currently being followed for severe AKI s/p CABG x 4 and MAZE.   **Severe AKI on CKD 3b:  Baseline 1.6 (Followed by Dr. Moshe Cipro outpt), was 1.9 prior to CABG and now AKI in setting of hemodynamic instability post op likely ATN but cholesterol embolic injury possible.  Initiated CRRT 10/27 for UF with ~anuria and volume overload.  Electrolytes, bicarb good today.  Seems like excess volume today. Increase UF to 50-100cc/hr. Too early to say if will need long term HD; anuric currently.  **Anemia: Hb 8s, transfuse PRN.    **Hyponatremia:resolved  **CAD s/p CABG  **A fib: amiodarone, anticoag.  **DM: per primary  **Shock: on norepi. Continue per primary

## 2021-07-14 NOTE — Progress Notes (Signed)
[facility] Progress Note Patient Name: Billy [name] DOB: September 19, 1965 MRN: [id number]   Date of Service  07/14/2021  HPI/Events of Note  Patient with copious liquid stools overflowing the bed.  eICU Interventions  Flexiseal ordered.        Nizhoni Parlow U Jenayah Antu 07/14/2021, 1:50 AM

## 2021-07-14 NOTE — Progress Notes (Addendum)
Patient ID: Billy [name], male   DOB: 1965-10-27, 55 y.o.   MRN: [id number]  TCTS Evening Rounds:  Remains of dobut 2.5 and NE 8.  122/48, NSR 80's, CVP 7  Sedated on vent. 60% FiO2.  CRRT for acute on chronic renal failure.  Maxipime for suspected pneumonia.

## 2021-07-14 NOTE — Progress Notes (Signed)
[place] Progress Note Patient Name: Billy [name] DOB: 03/24/1966 MRN: [id number]   Date of Service  07/14/2021  HPI/Events of Note  CXR reviewed, no pneumothorax.  eICU Interventions  Interval ABG ordered to assess oxygenation on current FiO2.        Everton Bertha U Ellenie Salome 07/14/2021, 6:30 AM

## 2021-07-14 NOTE — Progress Notes (Signed)
NAME:  Billy [name], MRN:  [id number], DOB:  Oct 31, 1965, LOS: 7 ADMISSION DATE:  07/07/2021, CONSULTATION DATE:  07/14/2021  REFERRING MD:  Lynnette Caffey, CHIEF COMPLAINT:  cardiogenic shock , on bipap   History of Present Illness:  55 year old diabetic, hypertensive admitted 10/24 with STEMI.  He had recent admission 10/11 for chest pain and hypertensive urgency with normal LVEF on echo.  He underwent left heart cath which showed three-vessel disease, underwent PCI with DES to left circumflex followed by CABG x4 with radial harvest and maze procedure.  Intra-Op EF was noted to be 20%.  He was extubated postop day 1 but developed progressive shock requiring Levophed and vasopressin with AKI and oliguria.  He was placed on BiPAP for hypoxia, ABG showed metabolic acidosis for which she was given 3 amps of bicarb and PCCM consulted Seen by heart failure service, coox 56% started on milrinone  Pertinent  Medical History  Diabetes Hypertension CKD stage II -Baseline creatinine 1.8 Paroxysmal atrial fibrillation Anxiety -on Xanax 2 mg thrice daily  Significant Hospital Events:   10/24 admitted with STEMI  10/27 PCCM consulted, due to respiratory issues intubated early morning   10/28 No acute events overnight, flipped back into NSR with less pressor requirement and decreased FIO2 this AM 10/30 increasing FiO2 again as fluid removal decreased.  Copious secretions.  Interim History / Subjective:     Objective   Blood pressure 100/64, pulse 60, temperature 97.7 F (36.5 C), temperature source Oral, resp. rate (!) 21, height 6\' 2"  (1.88 m), weight 119.8 kg, SpO2 94 %. CVP:  [3 mmHg-23 mmHg] 12 mmHg  Vent Mode: PRVC FiO2 (%):  [30 %-50 %] 40 % Set Rate:  [20 bmp] 20 bmp Vt Set:  [550 mL] 550 mL PEEP:  [5 cmH20] 5 cmH20 Plateau Pressure:  [17 cmH20-19 cmH20] 19 cmH20   Intake/Output Summary (Last 24 hours) at 07/14/2021 0850 Last data filed at 07/14/2021 0800 Gross per 24 hour  Intake  2122.29 ml  Output 3270 ml  Net -1147.71 ml    Filed Weights   07/10/21 0500 07/11/21 0500 07/14/21 0600  Weight: 119.4 kg 121.3 kg 119.8 kg    Examination: General: critically ill appearing man lying in bed in NAD HEENT: [place]/AT, eyes anicteric Neuro: Sedated but will wake up with stimulation. CV: S1S2, bradycardic, reg rhythm. Chest tubes in place with minimal bloody output. PULM: Copious secretions.  Normal vesicular breath sounds throughout except for l bronchial breath sounds over left lower lobe Extremities: mild peripheral edema, no cyanosis. Skin:  No rashes, no wounds. No erythema around LIJ CVC.   Resolved Hospital Problem list     Assessment & Plan:   Critically ill due to expected mixed cardiogenic shock and distributive shock due to ischemic cardiomyopathy now status post three-vessel CABG.  Coronary artery disease with recent anterior STEMI  CABG x4 with radial harvest Ischemic cardiomyopathy normal EF Paroxsymal A-fib- back in NSR today Critically ill due to acute hypoxic respiratory failure due to acute pulmonary edema require mechanical ventilation; Concern for evolving aspiration PNA Chronic benzo use, anxiety, severe agitation. High dose benzo use chronically PTA. Critically ill due to oliguric renal failure likely secondary to ATN requiring CRRT superimposed on CKD stage IIb Type 2 diabetes  with hyperglycemia Constipation, concern for reflux of TF At risk malnutrition  Acute anemia - stable  Generally making progress.  Limited by poor oxygenation due to left lower lobe collapse.  Plan:  -Chest physiotherapy, increase PEEP to  8, agree with mucolytics.  Right lateral decubitus positioning to attempt to pop open left lower lobe.  If oxygenation fails to improve, will consider bronchoscopy tomorrow for enhanced secretion clearance -Continue to titrate norepinephrine to keep MAP greater than 65.  At this point likely simply compensating for hypotensive  effects of sedation. -Continue dobutamine for low SCV O2, track daily Co. oximetry.  EF by echo 25% while off inotropic support. -Titrate sedation to keep RASS -1, -2. -Continue CRRT with gentle fluid removal.  CVP is 8 and edema is minimal.  Because of hypoxia is less likely fluid and more likely shunting from collapsed lower lobe. -Continue amiodarone at current dose.  Best Practice    Diet/type: tubefeeds per PEPUP protocol DVT prophylaxis: systemic heparin GI prophylaxis: PPI Lines: Central line Foley:  removal ordered  Code Status:  full code Last date of multidisciplinary goals of care discussion [sister updated at bedside today by Dr. Shiane Wenberg.]  CRITICAL CARE Performed by: [name]   Total critical care time: 40 minutes  Critical care time was exclusive of separately billable procedures and treating other patients.  Critical care was necessary to treat or prevent imminent or life-threatening deterioration.  Critical care was time spent personally by me on the following activities: development of treatment plan with patient and/or surrogate as well as nursing, discussions with consultants, evaluation of patient's response to treatment, examination of patient, obtaining history from patient or surrogate, ordering and performing treatments and interventions, ordering and review of laboratory studies, ordering and review of radiographic studies, pulse oximetry, re-evaluation of patient's condition and participation in multidisciplinary rounds.  [name], MD [facility] ICU Physician [place]  Pager: [phone] Mobile: [phone] After hours: [phone].

## 2021-07-14 NOTE — Procedures (Signed)
Cortrak  Tube Type:  Cortrak - 43 inches Tube Location:  Left nare Initial Placement:  Postpyloric Secured by: Bridle Technique Used to Measure Tube Placement:  Marking at nare/corner of mouth Cortrak Secured At:  100 cm  Cortrak Tube Team Note:  Consult received to place a Cortrak feeding tube.   X-ray is required, abdominal x-ray has been ordered by the Cortrak team. Please confirm tube placement before using the Cortrak tube.   If the tube becomes dislodged please keep the tube and contact the Cortrak team at [email or web address] ([email or web address]) for replacement.  If after hours and replacement cannot be delayed, place a NG tube and confirm placement with an abdominal x-ray.    Koleen Distance MS, RD, LDN Please refer to [facility] for RD and/or RD on-call/weekend/after hours pager

## 2021-07-15 ENCOUNTER — Inpatient Hospital Stay (HOSPITAL_COMMUNITY): Payer: Medicare HMO

## 2021-07-15 DIAGNOSIS — I5043 Acute on chronic combined systolic (congestive) and diastolic (congestive) heart failure: Secondary | ICD-10-CM | POA: Diagnosis not present

## 2021-07-15 DIAGNOSIS — R570 Cardiogenic shock: Secondary | ICD-10-CM | POA: Diagnosis not present

## 2021-07-15 DIAGNOSIS — N179 Acute kidney failure, unspecified: Secondary | ICD-10-CM | POA: Diagnosis not present

## 2021-07-15 DIAGNOSIS — J9601 Acute respiratory failure with hypoxia: Secondary | ICD-10-CM | POA: Diagnosis not present

## 2021-07-15 LAB — POCT I-STAT 7, (LYTES, BLD GAS, ICA,H+H)
Acid-base deficit: 9 mmol/L — ABNORMAL HIGH (ref 0.0–2.0)
Bicarbonate: 16.2 mmol/L — ABNORMAL LOW (ref 20.0–28.0)
Calcium, Ion: 0.87 mmol/L — CL (ref 1.15–1.40)
HCT: 16 % — ABNORMAL LOW (ref 39.0–52.0)
Hemoglobin: 5.4 g/dL — CL (ref 13.0–17.0)
O2 Saturation: 98 %
Patient temperature: 36.3
Potassium: 2.6 mmol/L — CL (ref 3.5–5.1)
Sodium: 145 mmol/L (ref 135–145)
TCO2: 17 mmol/L — ABNORMAL LOW (ref 22–32)
pCO2 arterial: 30.3 mmHg — ABNORMAL LOW (ref 32.0–48.0)
pH, Arterial: 7.334 — ABNORMAL LOW (ref 7.350–7.450)
pO2, Arterial: 111 mmHg — ABNORMAL HIGH (ref 83.0–108.0)

## 2021-07-15 LAB — GLUCOSE, CAPILLARY
Glucose-Capillary: 226 mg/dL — ABNORMAL HIGH (ref 70–99)
Glucose-Capillary: 229 mg/dL — ABNORMAL HIGH (ref 70–99)
Glucose-Capillary: 230 mg/dL — ABNORMAL HIGH (ref 70–99)
Glucose-Capillary: 244 mg/dL — ABNORMAL HIGH (ref 70–99)
Glucose-Capillary: 254 mg/dL — ABNORMAL HIGH (ref 70–99)
Glucose-Capillary: 265 mg/dL — ABNORMAL HIGH (ref 70–99)

## 2021-07-15 LAB — COOXEMETRY PANEL
Carboxyhemoglobin: 1.5 % (ref 0.5–1.5)
Carboxyhemoglobin: 1 % (ref 0.5–1.5)
Methemoglobin: 1.1 % (ref 0.0–1.5)
Methemoglobin: 1 % (ref 0.0–1.5)
O2 Saturation: 94.4 %
O2 Saturation: 68.6 %
Total hemoglobin: 8 g/dL — ABNORMAL LOW (ref 12.0–16.0)
Total hemoglobin: 7.8 g/dL — ABNORMAL LOW (ref 12.0–16.0)

## 2021-07-15 LAB — RENAL FUNCTION PANEL
Albumin: 1.8 g/dL — ABNORMAL LOW (ref 3.5–5.0)
Albumin: 1.7 g/dL — ABNORMAL LOW (ref 3.5–5.0)
Anion gap: 7 (ref 5–15)
Anion gap: 9 (ref 5–15)
BUN: 24 mg/dL — ABNORMAL HIGH (ref 6–20)
BUN: 23 mg/dL — ABNORMAL HIGH (ref 6–20)
CO2: 23 mmol/L (ref 22–32)
CO2: 22 mmol/L (ref 22–32)
Calcium: 7.6 mg/dL — ABNORMAL LOW (ref 8.9–10.3)
Calcium: 7.6 mg/dL — ABNORMAL LOW (ref 8.9–10.3)
Chloride: 102 mmol/L (ref 98–111)
Chloride: 103 mmol/L (ref 98–111)
Creatinine, Ser: 3.08 mg/dL — ABNORMAL HIGH (ref 0.61–1.24)
Creatinine, Ser: 3.14 mg/dL — ABNORMAL HIGH (ref 0.61–1.24)
GFR, Estimated: 23 mL/min — ABNORMAL LOW (ref >60)
GFR, Estimated: 22 mL/min — ABNORMAL LOW (ref >60)
Glucose, Bld: 276 mg/dL — ABNORMAL HIGH (ref 70–99)
Glucose, Bld: 239 mg/dL — ABNORMAL HIGH (ref 70–99)
Phosphorus: 3.9 mg/dL (ref 2.5–4.6)
Phosphorus: 3.1 mg/dL (ref 2.5–4.6)
Potassium: 4.9 mmol/L (ref 3.5–5.1)
Potassium: 4.5 mmol/L (ref 3.5–5.1)
Sodium: 132 mmol/L — ABNORMAL LOW (ref 135–145)
Sodium: 134 mmol/L — ABNORMAL LOW (ref 135–145)

## 2021-07-15 LAB — CBC
HCT: 24.5 % — ABNORMAL LOW (ref 39.0–52.0)
Hemoglobin: 7.9 g/dL — ABNORMAL LOW (ref 13.0–17.0)
MCH: 31.3 pg (ref 26.0–34.0)
MCHC: 32.2 g/dL (ref 30.0–36.0)
MCV: 97.2 fL (ref 80.0–100.0)
Platelets: 196 10*3/uL (ref 150–400)
RBC: 2.52 MIL/uL — ABNORMAL LOW (ref 4.22–5.81)
RDW: 13.2 % (ref 11.5–15.5)
WBC: 11.8 10*3/uL — ABNORMAL HIGH (ref 4.0–10.5)
nRBC: 0.2 % (ref 0.0–0.2)

## 2021-07-15 LAB — MAGNESIUM: Magnesium: 2.6 mg/dL — ABNORMAL HIGH (ref 1.7–2.4)

## 2021-07-15 MED ORDER — INSULIN DETEMIR 100 UNIT/ML ~~LOC~~ SOLN
20.0000 [IU] | Freq: Two times a day (BID) | SUBCUTANEOUS | Status: DC
  Administered 2021-07-15 – 2021-07-18 (×8): 20 [IU] via SUBCUTANEOUS
  Filled 2021-07-15 (×11): qty 0.2

## 2021-07-15 MED ORDER — AMIODARONE HCL IN DEXTROSE 360-4.14 MG/200ML-% IV SOLN
INTRAVENOUS | Status: AC
  Administered 2021-07-15: 60 mg/h via INTRAVENOUS
  Filled 2021-07-15: qty 200

## 2021-07-15 MED ORDER — AMIODARONE LOAD VIA INFUSION
150.0000 mg | Freq: Once | INTRAVENOUS | Status: AC
  Administered 2021-07-15: 150 mg via INTRAVENOUS

## 2021-07-15 MED ORDER — AMIODARONE HCL IN DEXTROSE 360-4.14 MG/200ML-% IV SOLN
30.0000 mg/h | INTRAVENOUS | Status: DC
  Administered 2021-07-15 – 2021-07-16 (×2): 30 mg/h via INTRAVENOUS
  Filled 2021-07-15: qty 200

## 2021-07-15 MED ORDER — ORAL CARE MOUTH RINSE
15.0000 mL | Freq: Two times a day (BID) | OROMUCOSAL | Status: DC
  Administered 2021-07-15 – 2021-07-16 (×3): 15 mL via OROMUCOSAL

## 2021-07-15 MED ORDER — CLONAZEPAM 0.5 MG PO TABS
0.5000 mg | ORAL_TABLET | Freq: Two times a day (BID) | ORAL | Status: DC
  Administered 2021-07-15 – 2021-07-16 (×2): 0.5 mg
  Filled 2021-07-15 (×2): qty 1

## 2021-07-15 MED ORDER — AMIODARONE HCL IN DEXTROSE 360-4.14 MG/200ML-% IV SOLN
60.0000 mg/h | INTRAVENOUS | Status: DC
  Administered 2021-07-15: 60 mg/h via INTRAVENOUS
  Filled 2021-07-15: qty 200

## 2021-07-15 NOTE — Progress Notes (Signed)
NAME:  Billy [name], MRN:  [id number], DOB:  Jan 16, 1966, LOS: 8 ADMISSION DATE:  07/07/2021, CONSULTATION DATE:  07/15/2021  REFERRING MD:  Lynnette Caffey, CHIEF COMPLAINT:  cardiogenic shock , on bipap   History of Present Illness:  55 year old diabetic, hypertensive admitted 10/24 with STEMI.  He had recent admission 10/11 for chest pain and hypertensive urgency with normal LVEF on echo.  He underwent left heart cath which showed three-vessel disease, underwent PCI with DES to left circumflex followed by CABG x4 with radial harvest and maze procedure.  Intra-Op EF was noted to be 20%.  He was extubated postop day 1 but developed progressive shock requiring Levophed and vasopressin with AKI and oliguria.  He was placed on BiPAP for hypoxia, ABG showed metabolic acidosis for which she was given 3 amps of bicarb and PCCM consulted Seen by heart failure service, coox 56% started on milrinone  Pertinent  Medical History  Diabetes Hypertension CKD stage II -Baseline creatinine 1.8 Paroxysmal atrial fibrillation Anxiety -on Xanax 2 mg thrice daily  Significant Hospital Events:   10/24 admitted with STEMI  10/27 PCCM consulted, due to respiratory issues intubated early morning   10/28 No acute events overnight, flipped back into NSR with less pressor requirement and decreased FIO2 this AM 10/30 increasing FiO2 again as fluid removal decreased.  Copious secretions.  Interim History / Subjective:     Objective   Blood pressure 114/63, pulse (!) 106, temperature 100.2 F (37.9 C), temperature source Axillary, resp. rate 18, height 6\' 2"  (1.88 m), weight 119 kg, SpO2 94 %. CVP:  [4 mmHg-19 mmHg] 8 mmHg  Vent Mode: PSV;CPAP FiO2 (%):  [40 %-75 %] 40 % Set Rate:  [20 bmp] 20 bmp Vt Set:  [550 mL] 550 mL PEEP:  [5 cmH20-8 cmH20] 5 cmH20 Pressure Support:  [8 cmH20] 8 cmH20 Plateau Pressure:  [16 cmH20-18 cmH20] 18 cmH20   Intake/Output Summary (Last 24 hours) at 07/15/2021 1001 Last data  filed at 07/15/2021 0940 Gross per 24 hour  Intake 2275.64 ml  Output 3325 ml  Net -1049.36 ml   Filed Weights   07/11/21 0500 07/14/21 0600 07/15/21 0500  Weight: 121.3 kg 119.8 kg 119 kg    Examination: General: Well-developed male who is alert and follows commands HEENT: MM pink/moist Neuro: Weak but grossly intact follows commands with extremities grips CV: Heart sounds are regular regular rate and rhythm PULM: Bilaterally Vent currently on pressure support ventilation with rate rate of 18 and pressure for volume of 460-540 FIO2 40% GI: soft, bsx4 active  Extremities: warm/dry, 2+ extremity edema  Skin: no rashes or lesions    Resolved Hospital Problem list     Assessment & Plan:   Critically ill due to expected mixed cardiogenic shock and distributive shock due to ischemic cardiomyopathy now status post three-vessel CABG.  Coronary artery disease with recent anterior STEMI  CABG x4 with radial harvest 07/09/2019 Ischemic cardiomyopathy normal EF Paroxsymal A-fib- back in NSR today Critically ill due to acute hypoxic respiratory failure due to acute pulmonary edema require mechanical ventilation; Concern for evolving aspiration PNA on cefepime Chronic benzo use, anxiety, severe agitation. High dose benzo use chronically PTA. Critically ill due to oliguric renal failure likely secondary to ATN requiring CRRT superimposed on CKD stage IIb 07/15/2021 appears ready for extubation Type 2 diabetes  with hyperglycemia Constipation, concern for reflux of TF At risk malnutrition  Acute anemia - stable  Generally making progress.  Limited by poor oxygenation due  to left lower lobe collapse.  Plan:  09/2020 assess for extubation note although he has not smoked cigarettes in 27 years he smokes marijuana every day.  Pulmonary toilet  Wean vasopressors  Continue dobutamine at this time.  Track cox  Titrate sedation to off  Continue CRRT and track creatinine Lab Results   Component Value Date   CREATININE 3.08 (H) 07/15/2021   CREATININE 2.95 (H) 07/14/2021   CREATININE 2.95 (H) 07/14/2021   CREATININE 1.02 09/09/2015   Continue amiodarone currently is normal sinus rhythm      Best Practice    Diet/type: tubefeeds per PEPUP protocol DVT prophylaxis: prophylactic heparin  GI prophylaxis: PPI Lines: Central line Foley:  Yes, and it is still needed Code Status:  full code Last date of multidisciplinary goals of care discussion [sister updated at bedside today by Dr. Agarwala.]  App CCT 40 min  Richardson Landry Chelsy Parrales ACNP Acute Care Nurse Practitioner [place] Please consult Amion 07/15/2021, 10:02 AM'

## 2021-07-15 NOTE — Progress Notes (Addendum)
Patient ID: Billy [name], male   DOB: 06-03-1966, 55 y.o.   MRN: [id number] P    Advanced Heart Failure Rounding Note  PCP-Cardiologist: None   Subjective:    10/25: S/P CABG x4 with MAZE . Intra Op EF 20%.  10/27: Intubated. Hypotensive. Placed on Norepi+ Vaso. Recurrent A fib with amio increased to 60 mg/hr.   BP dropped after Seroquel last night and NE increased. Now weaning back down  On NE 11 + 2.5 DBA, co-ox 69%  Trach aspirate culture NGTD. Off cefepime.  Remains intubated, wakes up and follows commands.   CVVH ongoing, pulling net neg 50-100/hr.  CVP 6-7.  CXR with bibasilar infiltrates.  Repeat this am pending   Objective:   Weight Range: 119 kg Body mass index is 33.68 kg/m.   Vital Signs:   Temp:  [97.5 F (36.4 C)-98.4 F (36.9 C)] 98.4 F (36.9 C) (11/01 0400) Pulse Rate:  [59-100] 94 (11/01 0700) Resp:  [9-28] 24 (11/01 0700) BP: (98-122)/(53-70) 118/66 (11/01 0700) SpO2:  [86 %-100 %] 100 % (11/01 0700) Arterial Line BP: (81-140)/(41-80) 122/41 (11/01 0700) FiO2 (%):  [40 %-75 %] 50 % (11/01 0600) Weight:  [119 kg] 119 kg (11/01 0500) Last BM Date: 07/15/21  Weight change: Filed Weights   07/11/21 0500 07/14/21 0600 07/15/21 0500  Weight: 121.3 kg 119.8 kg 119 kg    Intake/Output:   Intake/Output Summary (Last 24 hours) at 07/15/2021 0732 Last data filed at 07/15/2021 0600 Gross per 24 hour  Intake 2131.2 ml  Output 3164 ml  Net -1032.8 ml      Physical Exam  CVP 6-7 General:  Intubated. NO distress. HEENT: + ETT, R IJ Neck: supple. JVP 7. Carotids 2+ bilat; no bruits.  Cor: PMI nondisplaced. Regular rate & rhythm. No rubs, gallops or murmurs. Lungs: clear anteriorly Abdomen: soft, nontender, nondistended. No hepatosplenomegaly. No bruits or masses. Good bowel sounds. Extremities: no cyanosis, clubbing, rash, 1+ LE edema Neuro: Awake. Following commands.    Telemetry   NSR 80s-90s (personally reviewed)  EKG    N/A   Labs     CBC Recent Labs    07/14/21 0326 07/14/21 0358 07/14/21 0744 07/15/21 0455  WBC 9.2  --   --  11.8*  HGB 8.3*   < > 8.2* 7.9*  HCT 26.1*   < > 24.0* 24.5*  MCV 96.7  --   --  97.2  PLT 178  --   --  196   < > = values in this interval not displayed.   Basic Metabolic Panel Recent Labs    07/14/21 0326 07/14/21 0358 07/14/21 1657 07/15/21 0455  NA 134*   < > 134* 132*  K 4.2   < > 5.1 4.9  CL 102  --  103 102  CO2 23  --  23 23  GLUCOSE 193*  --  218* 276*  BUN 22*  --  23* 24*  CREATININE 2.95*  --  2.95* 3.08*  CALCIUM 7.7*  --  7.5* 7.6*  MG 2.6*  --   --  2.6*  PHOS 2.9  --  5.1* 3.9   < > = values in this interval not displayed.   Liver Function Tests Recent Labs    07/14/21 1657 07/15/21 0455  ALBUMIN 1.8* 1.8*   No results for input(s): LIPASE, AMYLASE in the last 72 hours. Cardiac Enzymes No results for input(s): CKTOTAL, CKMB, CKMBINDEX, TROPONINI in the last 72 hours.  BNP: BNP (last  3 results) No results for input(s): BNP in the last 8760 hours.  ProBNP (last 3 results) No results for input(s): PROBNP in the last 8760 hours.   D-Dimer No results for input(s): DDIMER in the last 72 hours. Hemoglobin A1C No results for input(s): HGBA1C in the last 72 hours.  Fasting Lipid Panel Recent Labs    07/14/21 0326  TRIG 240*    Thyroid Function Tests No results for input(s): TSH, T4TOTAL, T3FREE, THYROIDAB in the last 72 hours.  Invalid input(s): FREET3  Other results:   Imaging    DG Abd Portable 1V  Result Date: 07/14/2021 CLINICAL DATA:  Feeding tube placement. EXAM: PORTABLE ABDOMEN - 1 VIEW COMPARISON:  Abdominal x-ray from same day at 1131 hours. FINDINGS: Interval advancement of the feeding tube with the tip now at the junction of the third and fourth portions of the duodenum. Normal bowel gas pattern. IMPRESSION: 1. Interval feeding tube advancement with the tip now in the distal duodenum. Electronically Signed   By: Titus Dubin M.D.   On: 07/14/2021 13:20   DG Abd Portable 1V  Result Date: 07/14/2021 CLINICAL DATA:  Feeding tube placement EXAM: PORTABLE ABDOMEN - 1 VIEW COMPARISON:  07/12/2021 FINDINGS: Soft feeding tube enters the stomach in has its tip in the fundus. Regional gas pattern unremarkable. IMPRESSION: Soft feeding tube tip in the fundus of the stomach. Electronically Signed   By: Nelson Chimes M.D.   On: 07/14/2021 11:37     Medications:     Scheduled Medications:  acetylcysteine  4 mL Nebulization Q4H   amiodarone  200 mg Per Tube BID   aspirin  81 mg Per Tube Daily   Or   aspirin EC  81 mg Oral Daily   atorvastatin  80 mg Oral Daily   B-complex with vitamin C  1 tablet Per Tube Daily   bisacodyl  10 mg Rectal Daily   Or   bisacodyl  10 mg Oral Daily   chlorhexidine gluconate (MEDLINE KIT)  15 mL Mouth Rinse BID   Chlorhexidine Gluconate Cloth  6 each Topical Daily   clonazePAM  2 mg Per Tube BID   clopidogrel  75 mg Per Tube Daily   docusate  100 mg Per Tube BID   feeding supplement (PROSource TF)  90 mL Per Tube QID   heparin injection (subcutaneous)  5,000 Units Subcutaneous Q8H   insulin aspart  3 Units Subcutaneous Q4H   insulin aspart  3-9 Units Subcutaneous Q4H   mouth rinse  15 mL Mouth Rinse 10 times per day   metoCLOPramide (REGLAN) injection  5 mg Intravenous Q8H   multivitamin  15 mL Per Tube Daily   pantoprazole sodium  40 mg Per Tube Daily   polyethylene glycol  17 g Per Tube BID   QUEtiapine  25 mg Per Tube BID   sennosides  10 mL Per Tube BID   sodium chloride flush  10-40 mL Intracatheter Q12H   sodium chloride flush  3 mL Intravenous Q12H    Infusions:   prismasol BGK 4/2.5 400 mL/hr at 07/15/21 0643    prismasol BGK 4/2.5 300 mL/hr at 07/15/21 0319   sodium chloride     sodium chloride Stopped (07/13/21 0234)   sodium chloride     albumin human 12.5 g (07/08/21 1827)   And   sodium chloride Stopped (07/08/21 2051)   DOBUTamine 2.5 mcg/kg/min (07/15/21  0600)   feeding supplement (VITAL 1.5 CAL) 40 mL/hr at 07/15/21 0500  fentaNYL infusion INTRAVENOUS 135 mcg/hr (07/15/21 0600)   lactated ringers     lactated ringers 15 mL/hr at 07/15/21 0600   lactated ringers     norepinephrine (LEVOPHED) Adult infusion 16 mcg/min (07/15/21 0600)   prismasol BGK 4/2.5 1,500 mL/hr at 07/15/21 1696   promethazine (PHENERGAN) injection (IM or IVPB) Stopped (07/09/21 1628)   propofol (DIPRIVAN) infusion 40 mcg/kg/min (07/15/21 0600)    PRN Medications: sodium chloride, albumin human **AND** sodium chloride, albuterol, fentaNYL, heparin, heparin, lactated ringers, metoprolol tartrate, midazolam, midazolam, ondansetron (ZOFRAN) IV, oxyCODONE, promethazine (PHENERGAN) injection (IM or IVPB), sodium chloride, sodium chloride flush, sodium chloride flush    Patient Profile   Billy [name] is a 20 year with a history of visually impaired, DM, HTN, depression, anxiety takes 2 mg xanax 3 times a day, SVT, CKD Stage II, PAF, and CAD.Marland Kitchen   Anterior Stemi--> CABG--> cardiogenic shock.   Assessment/Plan   1. CAD: Admitted with anterolateral MI from occluded diagonal treated with DES.  Patient then had CABG with LIMA-LAD, SVG-D, SVG-PDA, and left radial-OM for complete revascularization.    - Continue ASA 81. Will likely drop this at discharge given need for Plavix and anticoagulation.  - Continue Plavix 75 daily.  - atorvastatin 80 mg daily.  - Amlodipine for radial harvest held with hypotension on pressors.    2. Atrial fibrillation: Paroxysmal, s/p Maze and appendage clipping.  He was in AF with RVR on 10/27, now back in NSR.  - Continue amiodarone 200 mg bid. - On heparin [place], ideally to apixaban eventually.   3. AKI on CKD stage 3: Baseline creatinine around 1.6, up to 5 post-op.  He has likely had multiple hits including contrast with initial cath/PCI and possible peri-operative hypotension.  Suspect ATN is present, will need to support him for now and hopefully  function will eventually improve. He is on CVVH. CVP 6-7 today. Pulling net negative UF 50-100 cc/hr, -1L yesterday. Down 2 lb overnight. Scr improving, 3 today 4. Acute systolic CHF: Ischemic cardiomyopathy.  Echo prior to this admission with EF 50-55% (pre-MI and CABG).  Intraoperative TEE with EF 20%.  CVP 10-11 today, on NE 7 and off vasopression.   Limited echo on 10/28 showed minimal pericardial effusion, EF in 25% range.  - Co-ox 69% on 11 NE + dobutamine 2.5 mcg/kg/min.  Increased NE overnight, BP dropped after Seroquel. Now weaning back down. Can continue to wean down NE as MAP tolerates.   - CVP 6-7, volume managed with CVVHD. Pulling for net neg 50-100/hr 5. Anemia: Post-op, Hgb 7.9. ? Role of transfusion. 6. ID: Afebrile today,  WBCs 13 => 11 => 9 => 11.8.  Trach aspirate culture negative.  CXR with bibasilar opacities. PCT 4.67.  - Course of cefepime, completes today.  7. Acute hypoxemic respiratory failure: Suspect primarily pulmonary edema, now intubated.  Possible PNA with bibasilar lung opacities.  - Chest PT.  - Per CCM, possible extubation today.   - Fluid removal via CVVH.  - Completed course of cefepime. AF. WBC stable. 8. Thrombocytopenia: Resolved.   FINCH, LINDSAY N 07/15/2021 7:32 AM  Patient seen with PA, agree with the above note.   Dropped BP overnight after getting Seroquel, NE increased.  Now tapering down again on NE (to 9).  Co-ox 69% on dobutamine 2.5.  CVP 6 today.    PCT 4.67, has suspected PNA on cefepime, now afebrile.   General: Sedated on vent.  Neck: No JVD, no thyromegaly or thyroid nodule.  Lungs: Decreased at bases.  CV: Nondisplaced PMI.  Heart regular S1/S2, no S3/S4, no murmur.  No peripheral edema.   Abdomen: Soft, nontender, no hepatosplenomegaly, no distention.  Skin: Intact without lesions or rashes.  Neurologic: Sedated Extremities: No clubbing or cyanosis.  HEENT: Normal.   CVP down to 6, can run CVVH today even to gently negative.     Continue current dobutamine and continue to titrate down NE.   Remains in NSR on amiodarone.  Not on full anticoagulation yet.   Possible PNA, elevated PCT.  Has had cefepime course.  Possible extubation today.   CRITICAL CARE Performed by: Loralie Champagne  Total critical care time: 35 minutes  Critical care time was exclusive of separately billable procedures and treating other patients.  Critical care was necessary to treat or prevent imminent or life-threatening deterioration.  Critical care was time spent personally by me on the following activities: development of treatment plan with patient and/or surrogate as well as nursing, discussions with consultants, evaluation of patient's response to treatment, examination of patient, obtaining history from patient or surrogate, ordering and performing treatments and interventions, ordering and review of laboratory studies, ordering and review of radiographic studies, pulse oximetry and re-evaluation of patient's condition.  Loralie Champagne 07/15/2021 8:07 AM

## 2021-07-15 NOTE — Progress Notes (Signed)
Post extubation, patient drowsy but able to follow commands. Right sided weakness noted including right hand grip and foot flexion/dorsiflexion. MD made aware. Per provider, will continue to reassess as patient becomes more alert.

## 2021-07-15 NOTE — Progress Notes (Addendum)
[facility] Progress Note   Subjective: On norepinephrine and dobutamine.  Receiving antibiotics for pneumonia.  -1.1 L yesterday.  Continuing to be slightly negative.  Objective Vitals:   07/15/21 0800 07/15/21 0900 07/15/21 1000 07/15/21 1025  BP: 121/73 114/63    Pulse: (!) 105 (!) 106 (!) 106 (!) 107  Resp: $Remo'19 16 18 20  'UocaT$ Temp:      TempSrc:      SpO2: 97% 95% 94% 91%  Weight:      Height:       Physical Exam General: intubated, sedated Heart: normal rate Lungs: coarse on vent, bilateral chest rise Abdomen: soft, nontender Extremities: trace edema  Additional Objective Labs: Basic Metabolic Panel: Recent Labs  Lab 07/14/21 0326 07/14/21 0358 07/14/21 0744 07/14/21 1657 07/15/21 0455  NA 134*   < > 135 134* 132*  K 4.2   < > 4.6 5.1 4.9  CL 102  --   --  103 102  CO2 23  --   --  23 23  GLUCOSE 193*  --   --  218* 276*  BUN 22*  --   --  23* 24*  CREATININE 2.95*  --   --  2.95* 3.08*  CALCIUM 7.7*  --   --  7.5* 7.6*  PHOS 2.9  --   --  5.1* 3.9   < > = values in this interval not displayed.   Liver Function Tests: Recent Labs  Lab 07/10/21 9163 07/10/21 1555 07/14/21 0326 07/14/21 1657 07/15/21 0455  AST 29  --   --   --   --   ALT 5  --   --   --   --   ALKPHOS 40  --   --   --   --   BILITOT 0.6  --   --   --   --   PROT 5.2*  --   --   --   --   ALBUMIN 2.3*   < > 1.8* 1.8* 1.8*   < > = values in this interval not displayed.   No results for input(s): LIPASE, AMYLASE in the last 168 hours. CBC: Recent Labs  Lab 07/11/21 0413 07/11/21 2035 07/12/21 0426 07/13/21 0237 07/14/21 0326 07/14/21 0358 07/14/21 0639 07/14/21 0744 07/15/21 0455  WBC 13.0*  --  11.0* 11.2* 9.2  --   --   --  11.8*  NEUTROABS 10.3*  --   --   --   --   --   --   --   --   HGB 8.4*   < > 9.0* 9.0* 8.3*   < > 8.5* 8.2* 7.9*  HCT 24.7*   < > 27.0* 27.4* 26.1*   < > 25.0* 24.0* 24.5*  MCV 94.3  --  94.7 96.1 96.7  --   --   --  97.2  PLT 148*  --   196 239 178  --   --   --  196   < > = values in this interval not displayed.   Blood Culture    Component Value Date/Time   SDES TRACHEAL ASPIRATE 07/12/2021 0817   SPECREQUEST NONE 07/12/2021 0817   CULT  07/12/2021 0817    Normal respiratory flora-no Staph aureus or Pseudomonas seen Performed at [facility] [address] [place], [place] [address]    REPTSTATUS 07/14/2021 FINAL 07/12/2021 0817    Cardiac Enzymes: No results for input(s): CKTOTAL, CKMB, CKMBINDEX, TROPONINI in  the last 168 hours. CBG: Recent Labs  Lab 07/14/21 1637 07/14/21 1945 07/14/21 2323 07/15/21 0509 07/15/21 0757  GLUCAP 198* 187* 212* 265* 254*   Iron Studies: No results for input(s): IRON, TIBC, TRANSFERRIN, FERRITIN in the last 72 hours. $RemoveB'@lablastinr3'DZRUfUbc$ @ Studies/Results: DG CHEST PORT 1 VIEW  Result Date: 07/15/2021 CLINICAL DATA:  Chest tube, respiratory failure EXAM: PORTABLE CHEST 1 VIEW COMPARISON:  Chest x-ray 07/14/2021 FINDINGS: Endotracheal tube tip is approximately 3.7 cm above the carina. Enteric tube tip is below the diaphragm. Right internal jugular line tip is in the SVC. Left internal jugular line tip is near the proximal SVC. Mediastinal drain is unchanged. Cardiac surgical changes and median sternotomy wires. Cardiomediastinal silhouette is unchanged. Improved aeration of the lower lung zones since previous study with mild hazy and linear opacities. Likely trace left pleural effusion. No pneumothorax visualized. IMPRESSION: 1. Medical devices as described. 2. Improved aeration of the lower lung zones since previous study with persistent mild opacities and likely trace left effusion. Electronically Signed   By: Ofilia Neas M.D.   On: 07/15/2021 08:29   DG CHEST PORT 1 VIEW  Result Date: 07/14/2021 CLINICAL DATA:  ARDS EXAM: PORTABLE CHEST 1 VIEW COMPARISON:  Yesterday FINDINGS: Endotracheal tube with tip just below the clavicular heads. Dialysis catheter and central line  with tips at the upper SVC. The enteric tube at least reaches the stomach. CABG and left atrial clipping. There is a drain over the central chest. Hazy appearance of the bilateral chest from atelectasis and pleural fluid primarily. No pneumothorax. IMPRESSION: 1. Stable hardware positioning. 2. Similar degree of hazy chest opacity from atelectasis and pleural fluid. Electronically Signed   By: Jorje Guild M.D.   On: 07/14/2021 06:12   DG Abd Portable 1V  Result Date: 07/14/2021 CLINICAL DATA:  Feeding tube placement. EXAM: PORTABLE ABDOMEN - 1 VIEW COMPARISON:  Abdominal x-ray from same day at 1131 hours. FINDINGS: Interval advancement of the feeding tube with the tip now at the junction of the third and fourth portions of the duodenum. Normal bowel gas pattern. IMPRESSION: 1. Interval feeding tube advancement with the tip now in the distal duodenum. Electronically Signed   By: Titus Dubin M.D.   On: 07/14/2021 13:20   DG Abd Portable 1V  Result Date: 07/14/2021 CLINICAL DATA:  Feeding tube placement EXAM: PORTABLE ABDOMEN - 1 VIEW COMPARISON:  07/12/2021 FINDINGS: Soft feeding tube enters the stomach in has its tip in the fundus. Regional gas pattern unremarkable. IMPRESSION: Soft feeding tube tip in the fundus of the stomach. Electronically Signed   By: Nelson Chimes M.D.   On: 07/14/2021 11:37   Medications:   prismasol BGK 4/2.5 400 mL/hr at 07/15/21 0643    prismasol BGK 4/2.5 300 mL/hr at 07/15/21 0319   sodium chloride     sodium chloride Stopped (07/13/21 0234)   sodium chloride     albumin human 12.5 g (07/08/21 1827)   And   sodium chloride Stopped (07/08/21 2051)   DOBUTamine 2.5 mcg/kg/min (07/15/21 0940)   feeding supplement (VITAL 1.5 CAL) 40 mL/hr at 07/15/21 0500   lactated ringers     lactated ringers 15 mL/hr at 07/15/21 0940   lactated ringers     norepinephrine (LEVOPHED) Adult infusion 7 mcg/min (07/15/21 0940)   prismasol BGK 4/2.5 1,500 mL/hr at 07/15/21 1012    promethazine (PHENERGAN) injection (IM or IVPB) Stopped (07/09/21 1628)    amiodarone  200 mg Per Tube BID   aspirin  81 mg Per Tube Daily   Or   aspirin EC  81 mg Oral Daily   atorvastatin  80 mg Oral Daily   B-complex with vitamin C  1 tablet Per Tube Daily   bisacodyl  10 mg Rectal Daily   Or   bisacodyl  10 mg Oral Daily   chlorhexidine gluconate (MEDLINE KIT)  15 mL Mouth Rinse BID   Chlorhexidine Gluconate Cloth  6 each Topical Daily   clonazePAM  0.5 mg Per Tube BID   clopidogrel  75 mg Per Tube Daily   docusate  100 mg Per Tube BID   feeding supplement (PROSource TF)  90 mL Per Tube QID   heparin injection (subcutaneous)  5,000 Units Subcutaneous Q8H   insulin aspart  3 Units Subcutaneous Q4H   insulin aspart  3-9 Units Subcutaneous Q4H   insulin detemir  20 Units Subcutaneous BID   mouth rinse  15 mL Mouth Rinse 10 times per day   metoCLOPramide (REGLAN) injection  5 mg Intravenous Q8H   multivitamin  15 mL Per Tube Daily   pantoprazole sodium  40 mg Per Tube Daily   polyethylene glycol  17 g Per Tube BID   QUEtiapine  25 mg Per Tube BID   sennosides  10 mL Per Tube BID   sodium chloride flush  10-40 mL Intracatheter Q12H   sodium chloride flush  3 mL Intravenous Q12H     Assessment/Plan: 55yo M DM, HTN, A fib, CKD currently being followed for severe AKI s/p CABG x 4 and MAZE.   **Severe AKI on CKD 3b:  Baseline 1.6 (Followed by Dr. Moshe Cipro outpt), was 1.9 prior to CABG and now AKI in setting of hemodynamic instability post op likely ATN but cholesterol embolic injury possible.  Initiated CRRT 10/27 for UF with ~anuria and volume overload.  Stable on CRRT today.  No acidosis or hyperkalemia.  Goal is to be slightly negative.  **Anemia: Hb ~8, transfuse PRN.    **Hyponatremia: Mild with sodium of 132.  Continue with volume removal as tolerated  **CAD s/p CABG  **A fib: amiodarone, anticoag.  **DM: per primary  **Shock: on norepi and dobutamine. Continue  per primary  **Pneumonia: Antibiotics per primary team

## 2021-07-15 NOTE — Progress Notes (Signed)
[place] visited pt. while rounding on [place]; pt.'s sister Kayla at bedside in [place]; pt. intubated and scheduled for extubation this AM per RN.  Sister shared that pt. was admitted after medical team observed possible heart attack during a scheduled stress test; upon admission [facility] medical team confirmed pt. was having a heart attack but per sister pt. was initially reluctant to proceed with surgical interventions, hoping to manage his illness "naturopathically".  "He keeps saying if he just eats lots of green vegetables everything will be OK," sister shared.  Sister says pt. did eventually consent to being brought to cath lab for treatment; stents were placed and pt. underwent open heart surgery.  Sister shared pt. has been struggling to recover from surgery, requiring reintubation for O2 support.  Today team in [place] plan to extubate pt. and sister expressed some trepidation and nervousness about this, hoping that pt. will do well after being extubated.  [facility] provided supportive presence and active listening.  Sister grateful for visit and support and said she would have requested prayer for pt. at bedside but did not want to do anything that might agitate or concern him.  Chaplains remain available as needed for additional support.  Lindaann Pascal, Chaplain Pager: [phone]

## 2021-07-15 NOTE — Progress Notes (Signed)
[address]       [place], [address]             [phone]                 7 Days Post-Op Procedure(s) (LRB): CORONARY ARTERY BYPASS GRAFTING (CABG) TIMES FOUR USING LEFT INTERNAL MAMMARY ARTERY, GREATER SAPHENOUS VEIN HARVESTED ENDOSCOPICALLY, AND LEFT RADIAL ARTERY HARVESTED OPEN (N/A) TRANSESOPHAGEAL ECHOCARDIOGRAM (TEE) (N/A) RADIAL ARTERY HARVEST (Left) ENDOVEIN HARVEST OF GREATER SAPHENOUS VEIN (Right) MAZE   Events: No events  _______________________________________________________________ Vitals: BP 118/66   Pulse (!) 101   Temp 100.2 F (37.9 C) (Axillary)   Resp 20   Ht 6\' 2"  (1.88 m)   Wt 119 kg   SpO2 99%   BMI 33.68 kg/m  Filed Weights   07/11/21 0500 07/14/21 0600 07/15/21 0500  Weight: 121.3 kg 119.8 kg 119 kg     - Neuro: sedated, but arousable  - Cardiovascular: sinus  Drips: levo 14 dob 2.5.  CVP:  [4 mmHg-19 mmHg] 5 mmHg  - Pulm:  Vent Mode: PSV;CPAP FiO2 (%):  [40 %-75 %] 40 % Set Rate:  [20 bmp] 20 bmp Vt Set:  [550 mL] 550 mL PEEP:  [5 cmH20-8 cmH20] 5 cmH20 Pressure Support:  [8 cmH20] 8 cmH20 Plateau Pressure:  [16 cmH20-18 cmH20] 18 cmH20  ABG    Component Value Date/Time   PHART 7.353 07/14/2021 0744   PCO2ART 45.6 07/14/2021 0744   PO2ART 107 07/14/2021 0744   HCO3 25.4 07/14/2021 0744   TCO2 27 07/14/2021 0744   ACIDBASEDEF 9.0 (H) 07/10/2021 0303   O2SAT 68.6 07/15/2021 0619    - Abd: Soft, distended - Extremity: warm  .Intake/Output      10/31 0701 11/01 0700 11/01 0701 11/02 0700   I.V. (mL/kg) 1630.8 (13.7) 63.5 (0.5)   NG/GT 453.3 40   IV Piggyback 200    Total Intake(mL/kg) 2284.1 (19.2) 103.5 (0.9)   Other 3148 86   Stool     Chest Tube 180    Total Output 3328 86   Net -1043.9 +17.5           _______________________________________________________________ Labs: CBC Latest Ref Rng & Units 07/15/2021 07/14/2021 07/14/2021  WBC 4.0 - 10.5 K/uL 11.8(H) - -  Hemoglobin 13.0  - 17.0 g/dL 7.9(L) 8.2(L) 8.5(L)  Hematocrit 39.0 - 52.0 % 24.5(L) 24.0(L) 25.0(L)  Platelets 150 - 400 K/uL 196 - -   CMP Latest Ref Rng & Units 07/15/2021 07/14/2021 07/14/2021  Glucose 70 - 99 mg/dL 276(H) 218(H) -  BUN 6 - 20 mg/dL 24(H) 23(H) -  Creatinine 0.61 - 1.24 mg/dL 3.08(H) 2.95(H) -  Sodium 135 - 145 mmol/L 132(L) 134(L) 135  Potassium 3.5 - 5.1 mmol/L 4.9 5.1 4.6  Chloride 98 - 111 mmol/L 102 103 -  CO2 22 - 32 mmol/L 23 23 -  Calcium 8.9 - 10.3 mg/dL 7.6(L) 7.5(L) -  Total Protein 6.5 - 8.1 g/dL - - -  Total Bilirubin 0.3 - 1.2 mg/dL - - -  Alkaline Phos 38 - 126 U/L - - -  AST 15 - 41 U/L - - -  ALT 0 - 44 U/L - - -    CXR: -Atelectasis and pulmonary vascular congestion.  _______________________________________________________________  Assessment and Plan: POD 7 s/p emergency CABG  Neuro: maintain sedation.  Better sedation on propofol. CV: on levo and dobutamine.  On amio PO  Remains in sinus,  Pulm: continue  vent support.  Wean to extubate today.   Renal: aneuric.  On CRRT.  CVP 3-4, may start running even.   GI: Will resume trickle feeds Heme: stable,  ID: afebrile.   Endo: SSI Dispo: continue ICU care.   Lajuana Matte 07/15/2021 8:08 AM

## 2021-07-15 NOTE — Procedures (Signed)
Extubation Procedure Note  Patient Details:   Name: ELIJA MCCAMISH DOB: [birth year] MRN: [id number]   Airway Documentation:    Vent end date: 07/15/21 Vent end time: 1025   Evaluation  O2 sats: stable throughout Complications: No apparent complications Patient did tolerate procedure well. Bilateral Breath Sounds: Clear   Yes  Pt extubated to 10L HFNC, pt tolerating well at this time. Cuff leak present, no stridor noted,  RN at bedside, MD aware, RT will continue to monitor.  Carren Rang 07/15/2021, 10:27 AM

## 2021-07-16 ENCOUNTER — Inpatient Hospital Stay (HOSPITAL_COMMUNITY): Payer: Medicare HMO

## 2021-07-16 DIAGNOSIS — R570 Cardiogenic shock: Secondary | ICD-10-CM | POA: Diagnosis not present

## 2021-07-16 DIAGNOSIS — J9601 Acute respiratory failure with hypoxia: Secondary | ICD-10-CM | POA: Diagnosis not present

## 2021-07-16 DIAGNOSIS — I5043 Acute on chronic combined systolic (congestive) and diastolic (congestive) heart failure: Secondary | ICD-10-CM | POA: Diagnosis not present

## 2021-07-16 DIAGNOSIS — N179 Acute kidney failure, unspecified: Secondary | ICD-10-CM | POA: Diagnosis not present

## 2021-07-16 LAB — CBC WITH DIFFERENTIAL/PLATELET
Abs Immature Granulocytes: 0.1 10*3/uL — ABNORMAL HIGH (ref 0.00–0.07)
Basophils Absolute: 0 10*3/uL (ref 0.0–0.1)
Basophils Relative: 0 %
Eosinophils Absolute: 0 10*3/uL (ref 0.0–0.5)
Eosinophils Relative: 0 %
HCT: 22.6 % — ABNORMAL LOW (ref 39.0–52.0)
Hemoglobin: 7.1 g/dL — ABNORMAL LOW (ref 13.0–17.0)
Lymphocytes Relative: 9 %
Lymphs Abs: 1.1 10*3/uL (ref 0.7–4.0)
MCH: 30.7 pg (ref 26.0–34.0)
MCHC: 31.4 g/dL (ref 30.0–36.0)
MCV: 97.8 fL (ref 80.0–100.0)
Metamyelocytes Relative: 1 %
Monocytes Absolute: 0.5 10*3/uL (ref 0.1–1.0)
Monocytes Relative: 4 %
Neutro Abs: 10.6 10*3/uL — ABNORMAL HIGH (ref 1.7–7.7)
Neutrophils Relative %: 86 %
Platelets: 222 10*3/uL (ref 150–400)
RBC: 2.31 MIL/uL — ABNORMAL LOW (ref 4.22–5.81)
RDW: 13.3 % (ref 11.5–15.5)
WBC: 12.3 10*3/uL — ABNORMAL HIGH (ref 4.0–10.5)
nRBC: 0 % (ref 0.0–0.2)
nRBC: 0 /100 WBC

## 2021-07-16 LAB — PREPARE RBC (CROSSMATCH)

## 2021-07-16 LAB — RENAL FUNCTION PANEL
Albumin: 1.7 g/dL — ABNORMAL LOW (ref 3.5–5.0)
Albumin: 2 g/dL — ABNORMAL LOW (ref 3.5–5.0)
Anion gap: 8 (ref 5–15)
Anion gap: 9 (ref 5–15)
BUN: 27 mg/dL — ABNORMAL HIGH (ref 6–20)
BUN: 31 mg/dL — ABNORMAL HIGH (ref 6–20)
CO2: 24 mmol/L (ref 22–32)
CO2: 24 mmol/L (ref 22–32)
Calcium: 7.6 mg/dL — ABNORMAL LOW (ref 8.9–10.3)
Calcium: 7.8 mg/dL — ABNORMAL LOW (ref 8.9–10.3)
Chloride: 101 mmol/L (ref 98–111)
Chloride: 103 mmol/L (ref 98–111)
Creatinine, Ser: 3.36 mg/dL — ABNORMAL HIGH (ref 0.61–1.24)
Creatinine, Ser: 3.4 mg/dL — ABNORMAL HIGH (ref 0.61–1.24)
GFR, Estimated: 21 mL/min — ABNORMAL LOW (ref >60)
GFR, Estimated: 20 mL/min — ABNORMAL LOW (ref >60)
Glucose, Bld: 237 mg/dL — ABNORMAL HIGH (ref 70–99)
Glucose, Bld: 221 mg/dL — ABNORMAL HIGH (ref 70–99)
Phosphorus: 2.5 mg/dL (ref 2.5–4.6)
Phosphorus: 2.7 mg/dL (ref 2.5–4.6)
Potassium: 4 mmol/L (ref 3.5–5.1)
Potassium: 4 mmol/L (ref 3.5–5.1)
Sodium: 133 mmol/L — ABNORMAL LOW (ref 135–145)
Sodium: 136 mmol/L (ref 135–145)

## 2021-07-16 LAB — GLUCOSE, CAPILLARY
Glucose-Capillary: 205 mg/dL — ABNORMAL HIGH (ref 70–99)
Glucose-Capillary: 197 mg/dL — ABNORMAL HIGH (ref 70–99)
Glucose-Capillary: 237 mg/dL — ABNORMAL HIGH (ref 70–99)
Glucose-Capillary: 206 mg/dL — ABNORMAL HIGH (ref 70–99)
Glucose-Capillary: 187 mg/dL — ABNORMAL HIGH (ref 70–99)

## 2021-07-16 LAB — POCT I-STAT 7, (LYTES, BLD GAS, ICA,H+H)
Acid-Base Excess: 1 mmol/L (ref 0.0–2.0)
Bicarbonate: 24.8 mmol/L (ref 20.0–28.0)
Calcium, Ion: 1.09 mmol/L — ABNORMAL LOW (ref 1.15–1.40)
HCT: 24 % — ABNORMAL LOW (ref 39.0–52.0)
Hemoglobin: 8.2 g/dL — ABNORMAL LOW (ref 13.0–17.0)
O2 Saturation: 94 %
Potassium: 4.2 mmol/L (ref 3.5–5.1)
Sodium: 137 mmol/L (ref 135–145)
TCO2: 26 mmol/L (ref 22–32)
pCO2 arterial: 34.9 mmHg (ref 32.0–48.0)
pH, Arterial: 7.459 — ABNORMAL HIGH (ref 7.350–7.450)
pO2, Arterial: 65 mmHg — ABNORMAL LOW (ref 83.0–108.0)

## 2021-07-16 LAB — MAGNESIUM: Magnesium: 2.8 mg/dL — ABNORMAL HIGH (ref 1.7–2.4)

## 2021-07-16 LAB — COOXEMETRY PANEL
Carboxyhemoglobin: 1.6 % — ABNORMAL HIGH (ref 0.5–1.5)
Carboxyhemoglobin: 1.2 % (ref 0.5–1.5)
Methemoglobin: 1.2 % (ref 0.0–1.5)
Methemoglobin: 0.9 % (ref 0.0–1.5)
O2 Saturation: 50.5 %
O2 Saturation: 51.8 %
Total hemoglobin: 7.5 g/dL — ABNORMAL LOW (ref 12.0–16.0)
Total hemoglobin: 7.5 g/dL — ABNORMAL LOW (ref 12.0–16.0)

## 2021-07-16 LAB — PHOSPHORUS: Phosphorus: 2.5 mg/dL (ref 2.5–4.6)

## 2021-07-16 LAB — HEMOGLOBIN AND HEMATOCRIT, BLOOD
HCT: 25.4 % — ABNORMAL LOW (ref 39.0–52.0)
Hemoglobin: 8.4 g/dL — ABNORMAL LOW (ref 13.0–17.0)

## 2021-07-16 MED ORDER — ATORVASTATIN CALCIUM 80 MG PO TABS
80.0000 mg | ORAL_TABLET | Freq: Every day | ORAL | Status: DC
  Administered 2021-07-17 – 2021-07-18 (×2): 80 mg
  Filled 2021-07-16 (×2): qty 1

## 2021-07-16 MED ORDER — ACETAMINOPHEN 325 MG PO TABS
650.0000 mg | ORAL_TABLET | Freq: Four times a day (QID) | ORAL | Status: DC | PRN
  Administered 2021-07-16: 650 mg via ORAL
  Filled 2021-07-16: qty 2

## 2021-07-16 MED ORDER — AMIODARONE HCL 200 MG PO TABS
200.0000 mg | ORAL_TABLET | Freq: Two times a day (BID) | ORAL | Status: DC
  Administered 2021-07-16 – 2021-07-18 (×6): 200 mg
  Filled 2021-07-16 (×6): qty 1

## 2021-07-16 MED ORDER — INSULIN ASPART 100 UNIT/ML IJ SOLN
3.0000 [IU] | Freq: Once | INTRAMUSCULAR | Status: AC
  Administered 2021-07-16: 3 [IU] via SUBCUTANEOUS

## 2021-07-16 MED ORDER — VITAL 1.5 CAL PO LIQD
1000.0000 mL | ORAL | Status: DC
  Administered 2021-07-17: 1000 mL

## 2021-07-16 MED ORDER — AMIODARONE HCL 200 MG PO TABS: 200.0000 mg | ORAL_TABLET | Freq: Every day | ORAL | Status: DC

## 2021-07-16 MED ORDER — NOREPINEPHRINE 16 MG/250ML-% IV SOLN
0.0000 ug/min | INTRAVENOUS | Status: DC
Start: 2021-07-16 — End: 2021-07-17
  Administered 2021-07-16: 2 ug/min via INTRAVENOUS

## 2021-07-16 MED ORDER — AMIODARONE HCL 200 MG PO TABS: 200.0000 mg | ORAL_TABLET | Freq: Two times a day (BID) | ORAL | Status: DC

## 2021-07-16 MED ORDER — ORAL CARE MOUTH RINSE
15.0000 mL | OROMUCOSAL | Status: DC
  Administered 2021-07-16 – 2021-07-17 (×6): 15 mL via OROMUCOSAL

## 2021-07-16 MED ORDER — DOCUSATE SODIUM 50 MG/5ML PO LIQD
100.0000 mg | Freq: Two times a day (BID) | ORAL | Status: DC
  Administered 2021-07-16 (×2): 100 mg
  Filled 2021-07-16 (×2): qty 10

## 2021-07-16 MED ORDER — CLONAZEPAM 0.25 MG PO TBDP
0.2500 mg | ORAL_TABLET | Freq: Two times a day (BID) | ORAL | Status: DC
  Administered 2021-07-16 – 2021-07-17 (×2): 0.25 mg
  Filled 2021-07-16 (×2): qty 1

## 2021-07-16 MED ORDER — ALBUMIN HUMAN 25 % IV SOLN
12.5000 g | Freq: Once | INTRAVENOUS | Status: AC
  Administered 2021-07-16: 12.5 g via INTRAVENOUS

## 2021-07-16 MED ORDER — PROSOURCE TF PO LIQD
45.0000 mL | Freq: Four times a day (QID) | ORAL | Status: DC
  Administered 2021-07-16 – 2021-07-22 (×10): 45 mL
  Filled 2021-07-16 (×12): qty 45

## 2021-07-16 MED ORDER — SIMETHICONE 40 MG/0.6ML PO SUSP
40.0000 mg | Freq: Four times a day (QID) | ORAL | Status: DC | PRN
  Administered 2021-07-16 – 2021-07-17 (×4): 40 mg via ORAL
  Filled 2021-07-16 (×5): qty 0.6

## 2021-07-16 MED ORDER — INSULIN ASPART 100 UNIT/ML IJ SOLN
6.0000 [IU] | INTRAMUSCULAR | Status: DC
  Administered 2021-07-16 – 2021-07-20 (×18): 6 [IU] via SUBCUTANEOUS

## 2021-07-16 MED ORDER — QUETIAPINE FUMARATE 25 MG PO TABS: 25.0000 mg | ORAL_TABLET | Freq: Every day | ORAL | Status: DC

## 2021-07-16 MED ORDER — FUROSEMIDE 10 MG/ML IJ SOLN
120.0000 mg | Freq: Once | INTRAVENOUS | Status: AC
  Administered 2021-07-16: 120 mg via INTRAVENOUS
  Filled 2021-07-16: qty 12

## 2021-07-16 NOTE — Progress Notes (Addendum)
Patient ID: Billy [name], male   DOB: December 05, 1965, 55 y.o.   MRN: [id number] P    Advanced Heart Failure Rounding Note  PCP-Cardiologist: None   Subjective:    10/25: S/P CABG x4 with MAZE . Intra Op EF 20%.  10/27: Intubated. Hypotensive. Placed on Norepi + Vaso. Recurrent A fib with amio increased to 60 mg/hr.  10/29: Co-ox low, DBA added 11/1: Extubated. AF with RVR, amio gtt restarted  Feels cold. Awake. Follows commands.  Recurrent AF with RVR yesterday afternoon. Given bolus IV amio and started on amio gtt. Now on 30 mg/hr. Maintaining SR overnight  Off NE. On DBA 2.5 mcg/kg/min, Co-ox 50% this am  Trach aspirate culture NGTD. Off cefepime. CXR 11/01 with improved aeration, persistent opacities. CXR this am pending  CVVH ongoing, pulling slightly negative. Now making some urine. CVP 10-12.    Objective:   Weight Range: 118.9 kg Body mass index is 33.66 kg/m.   Vital Signs:   Temp:  [98.2 F (36.8 C)-100.2 F (37.9 C)] 98.4 F (36.9 C) (11/02 0400) Pulse Rate:  [86-143] 94 (11/02 0600) Resp:  [16-27] 24 (11/02 0600) BP: (97-139)/(54-73) 138/66 (11/02 0600) SpO2:  [89 %-100 %] 99 % (11/02 0600) Arterial Line BP: (85-170)/(35-65) 166/63 (11/02 0600) FiO2 (%):  [40 %] 40 % (11/01 0800) Weight:  [118.9 kg] 118.9 kg (11/02 0432) Last BM Date: 07/15/21  Weight change: Filed Weights   07/14/21 0600 07/15/21 0500 07/16/21 0432  Weight: 119.8 kg 119 kg 118.9 kg    Intake/Output:   Intake/Output Summary (Last 24 hours) at 07/16/2021 0716 Last data filed at 07/16/2021 0600 Gross per 24 hour  Intake 1938.4 ml  Output 2998 ml  Net -1059.6 ml      Physical Exam  CVP 10-12 General: Lying in bed. No distress. HEENT: normal Neck: supple. RIJ HD cath, LIJ central line. Carotids 2+ bilat; no bruits. No lymphadenopathy or thryomegaly appreciated. Cor: PMI nondisplaced. Regular rate & rhythm. No rubs, gallops 3/6 systolic murmur Lungs: clear anteriorly Abdomen: soft,  nontender, + distended.  Extremities: no cyanosis, clubbing, rash, 2+ edema Neuro: alert & orientedx3, cranial nerves grossly intact. moves all 4 extremities w/o difficulty. Affect pleasant     Telemetry   AF with RVR yesterday afternoon, maintaining SR overnight (personally reviewed)   Labs    CBC Recent Labs    07/15/21 0455 07/16/21 0415  WBC 11.8* 12.3*  NEUTROABS  --  10.6*  HGB 7.9* 7.1*  HCT 24.5* 22.6*  MCV 97.2 97.8  PLT 196 557   Basic Metabolic Panel Recent Labs    07/15/21 0455 07/15/21 1615 07/16/21 0415  NA 132* 134* 133*  K 4.9 4.5 4.0  CL 102 103 101  CO2 23 22 24   GLUCOSE 276* 239* 237*  BUN 24* 23* 27*  CREATININE 3.08* 3.14* 3.36*  CALCIUM 7.6* 7.6* 7.6*  MG 2.6*  --  2.8*  PHOS 3.9 3.1 2.5  2.5   Liver Function Tests Recent Labs    07/15/21 1615 07/16/21 0415  ALBUMIN 1.7* 1.7*   No results for input(s): LIPASE, AMYLASE in the last 72 hours. Cardiac Enzymes No results for input(s): CKTOTAL, CKMB, CKMBINDEX, TROPONINI in the last 72 hours.  BNP: BNP (last 3 results) No results for input(s): BNP in the last 8760 hours.  ProBNP (last 3 results) No results for input(s): PROBNP in the last 8760 hours.   D-Dimer No results for input(s): DDIMER in the last 72 hours. Hemoglobin A1C No  results for input(s): HGBA1C in the last 72 hours.  Fasting Lipid Panel Recent Labs    07/14/21 0326  TRIG 240*    Thyroid Function Tests No results for input(s): TSH, T4TOTAL, T3FREE, THYROIDAB in the last 72 hours.  Invalid input(s): FREET3  Other results:   Imaging    No results found.   Medications:     Scheduled Medications:  aspirin  81 mg Per Tube Daily   Or   aspirin EC  81 mg Oral Daily   atorvastatin  80 mg Oral Daily   B-complex with vitamin C  1 tablet Per Tube Daily   bisacodyl  10 mg Rectal Daily   Or   bisacodyl  10 mg Oral Daily   Chlorhexidine Gluconate Cloth  6 each Topical Daily   clonazePAM  0.5 mg Per  Tube BID   clopidogrel  75 mg Per Tube Daily   feeding supplement (PROSource TF)  90 mL Per Tube QID   heparin injection (subcutaneous)  5,000 Units Subcutaneous Q8H   insulin aspart  3 Units Subcutaneous Q4H   insulin aspart  3-9 Units Subcutaneous Q4H   insulin detemir  20 Units Subcutaneous BID   mouth rinse  15 mL Mouth Rinse BID   metoCLOPramide (REGLAN) injection  5 mg Intravenous Q8H   multivitamin  15 mL Per Tube Daily   pantoprazole sodium  40 mg Per Tube Daily   polyethylene glycol  17 g Per Tube BID   QUEtiapine  25 mg Per Tube BID   sennosides  10 mL Per Tube BID   sodium chloride flush  10-40 mL Intracatheter Q12H   sodium chloride flush  3 mL Intravenous Q12H    Infusions:   prismasol BGK 4/2.5 400 mL/hr at 07/16/21 0416    prismasol BGK 4/2.5 300 mL/hr at 07/15/21 1733   sodium chloride     sodium chloride Stopped (07/13/21 0234)   sodium chloride     albumin human 12.5 g (07/08/21 1827)   And   sodium chloride Stopped (07/08/21 2051)   amiodarone 30 mg/hr (07/16/21 0600)   DOBUTamine 2.5 mcg/kg/min (07/16/21 0600)   feeding supplement (VITAL 1.5 CAL) 50 mL/hr at 07/16/21 0400   lactated ringers 15 mL/hr at 07/16/21 0600   lactated ringers     norepinephrine (LEVOPHED) Adult infusion Stopped (07/15/21 1507)   prismasol BGK 4/2.5 1,500 mL/hr at 07/15/21 2356   promethazine (PHENERGAN) injection (IM or IVPB) Stopped (07/09/21 1628)    PRN Medications: sodium chloride, albumin human **AND** sodium chloride, albuterol, heparin, heparin, metoprolol tartrate, ondansetron (ZOFRAN) IV, oxyCODONE, promethazine (PHENERGAN) injection (IM or IVPB), sodium chloride, sodium chloride flush, sodium chloride flush    Patient Profile   Billy [name] is a 66 year with a history of visually impaired, DM, HTN, depression, anxiety takes 2 mg xanax 3 times a day, SVT, CKD Stage II, PAF, and CAD.Marland Kitchen   Anterior Stemi--> CABG--> cardiogenic shock.   Assessment/Plan   1. CAD:  Admitted with anterolateral MI from occluded diagonal treated with DES.  Patient then had CABG with LIMA-LAD, SVG-D, SVG-PDA, and left radial-OM for complete revascularization.    - Continue ASA 81. Will likely drop this at discharge given need for Plavix and anticoagulation.  - Continue Plavix 75 daily.  - atorvastatin 80 mg daily.  - Amlodipine for radial harvest held with hypotension on pressors.    2. Atrial fibrillation: Paroxysmal, s/p Maze and appendage clipping.   - AF with RVR on 10/27, converted.  Back in AF with RVR 11/01 and converted to sinus with IV amio.  - Now on amio at 30 mg/hr - Continue amiodarone  - On heparin [place], ideally to apixaban eventually.   - Transfusing today for hgb 7.1 3. AKI on CKD stage 3: Baseline creatinine around 1.6, up to 5 post-op.  He has likely had multiple hits including contrast with initial cath/PCI and possible peri-operative hypotension.  Suspect ATN is present, will need to support him for now and hopefully function will eventually improve. He is on CVVH. CVP 10-12. Pulling slightly negative, -1L yesterday. Will incraese to aim for net neg 100/hr. Weight stable. Now making some urine.  4. Acute systolic CHF: Ischemic cardiomyopathy.  Echo prior to this admission with EF 50-55% (pre-MI and CABG).  Limited echo on 10/28 showed minimal pericardial effusion, EF in 25% range. Intraoperative TEE with EF 20%.  Co-ox 50% on DBA 2.5. Rechecking.  Off vasopression + NE.   CVP 10-12 today. Volume managed with CVVH. Aiming for net neg 100/hr today 5. Anemia: Post-op, Hgb 7.9>7.1 -CTS transfusing 1 u PRBCs 6. ID: Afebrile today,  WBCs 13 => 11 => 9 => 11.8 => 12.3.  Trach aspirate culture negative.  CXR with bibasilar opacities. PCT 4.67.  - Course of cefepime completed 7. Acute hypoxemic respiratory failure: Suspect primarily pulmonary edema, now intubated.  Possible PNA with bibasilar lung opacities.  - Chest PT.  - Extubated 11/01, 02 sats stable on 5L  -  Fluid removal via CVVH.  - Completed course of cefepime. AF. WBC stable. 8. Thrombocytopenia: Resolved.   FINCH, LINDSAY N 07/16/2021 7:16 AM  Patient seen with PA, agree with the above note.   Extubated yesterday.  Afib/RVR yesterday pm, amiodarone transitioned to gtt.  He is in NSR today.  Hgb down to 7.1 without overt bleeding.  CVP 10, co-ox 50% on dobutamine 2.5.   Off norepinephrine.  MAP stable.  CVVH pulling net 50 cc/hr, made 400 cc urine.   General: NAD Neck: JVP 10 cm, no thyromegaly or thyroid nodule.  Lungs: Clear to auscultation bilaterally with normal respiratory effort. CV: Nondisplaced PMI.  Heart regular S1/S2, no S3/S4, no murmur.  1+ ankle edema.  Abdomen: Soft, nontender, no hepatosplenomegaly, no distention.  Skin: Intact without lesions or rashes.  Neurologic: Awake, follows commands.  Extremities: No clubbing or cyanosis.  HEENT: Normal.   Continue amiodarone gtt,  now in NSR.  Only on heparin [place] in setting of low hgb and need for transfusion.   Has some protection from LA appendage clipping.  Eventually to Eliquis.   Continue dobutamine today, resend co-ox.  CVP mildly elevated and looks volume up.  Aim for net negative 100 cc/hr UF today.  MAP stable.  Hoping for renal recovery.   Transfuse 1 unit PRBCs today.   CRITICAL CARE Performed by: Loralie Champagne  Total critical care time: 35 minutes  Critical care time was exclusive of separately billable procedures and treating other patients.  Critical care was necessary to treat or prevent imminent or life-threatening deterioration.  Critical care was time spent personally by me on the following activities: development of treatment plan with patient and/or surrogate as well as nursing, discussions with consultants, evaluation of patient's response to treatment, examination of patient, obtaining history from patient or surrogate, ordering and performing treatments and interventions, ordering and review of  laboratory studies, ordering and review of radiographic studies, pulse oximetry and re-evaluation of patient's condition.  Loralie Champagne 07/16/2021 8:04 AM

## 2021-07-16 NOTE — Progress Notes (Signed)
NAME:  Billy [name], MRN:  [id number], DOB:  05-13-1966, LOS: 9 ADMISSION DATE:  07/07/2021, CONSULTATION DATE:  07/16/2021  REFERRING MD:  Lynnette Caffey, CHIEF COMPLAINT:  cardiogenic shock , on bipap   History of Present Illness:  55 year old diabetic, hypertensive admitted 10/24 with STEMI.  He had recent admission 10/11 for chest pain and hypertensive urgency with normal LVEF on echo.  He underwent left heart cath which showed three-vessel disease, underwent PCI with DES to left circumflex followed by CABG x4 with radial harvest and maze procedure.  Intra-Op EF was noted to be 20%.  He was extubated postop day 1 but developed progressive shock requiring Levophed and vasopressin with AKI and oliguria.  He was placed on BiPAP for hypoxia, ABG showed metabolic acidosis for which she was given 3 amps of bicarb and PCCM consulted Seen by heart failure service, coox 56% started on milrinone  Pertinent  Medical History  Diabetes Hypertension CKD stage II -Baseline creatinine 1.8 Paroxysmal atrial fibrillation Anxiety -on Xanax 2 mg thrice daily  Significant Hospital Events:   10/24 admitted with STEMI  10/27 PCCM consulted, due to respiratory issues intubated early morning   10/28 No acute events overnight, flipped back into NSR with less pressor requirement and decreased FIO2 this AM 10/30 increasing FiO2 again as fluid removal decreased.  Copious secretions. 11/1 extubated  Interim History / Subjective:   Tolerated extubation yesterday.  But diffusely weak particularly on the right side. Remains on CRRT with goal for today for more aggressive fluid removal per heart failure.  Objective   Blood pressure (!) 90/54, pulse 85, temperature 98.2 F (36.8 C), temperature source Oral, resp. rate (!) 34, height 6\' 2"  (1.88 m), weight 118.9 kg, SpO2 98 %. CVP:  [8 mmHg-11 mmHg] 11 mmHg  FiO2 (%):  [35 %] 35 %   Intake/Output Summary (Last 24 hours) at 07/16/2021 1235 Last data filed at  07/16/2021 1200 Gross per 24 hour  Intake 2772.4 ml  Output 3543 ml  Net -770.6 ml    Filed Weights   07/14/21 0600 07/15/21 0500 07/16/21 0432  Weight: 119.8 kg 119 kg 118.9 kg    Examination: General: Well-developed male who is alert and follows commands HEENT: MM pink/moist Neuro: Weak generally but more so on the right in both upper and lower extremities CV: Distant but normal heart sounds. PULM: On 6 L high flow nasal cannula.  No respiratory distress.  Chest clear bilaterally.  Bronchial breath sounds are absent at the left base now.  Sternal incision is well-healed.  GI: soft, bsx4 active  Extremities: warm/dry, 2+ extremity edema  Skin: no rashes or lesions   Ancillary tests   Chest x-ray personally reviewed shows large gastric air bubble and left basilar atelectasis but otherwise clear lung fields. Hemoglobin 7.1 Continues to have hyperglycemia SCV O2 stable at 51.8 (low due to anemia)  Assessment & Plan:   Critically ill due to expected mixed cardiogenic shock and distributive shock due to ischemic cardiomyopathy now status post three-vessel CABG.  Coronary artery disease with recent anterior STEMI  CABG x4 with radial harvest 07/09/2019 Ischemic cardiomyopathy normal EF Paroxsymal A-fib- back in NSR today Possible lacunar stroke with right hemiplegia Critically ill due to acute hypoxic respiratory failure due to acute pulmonary edema require mechanical ventilation; Concern for evolving aspiration PNA on cefepime Chronic benzo use, anxiety, severe agitation. High dose benzo use chronically PTA. Critically ill due to oliguric renal failure likely secondary to ATN requiring CRRT superimposed  on CKD stage IIb 07/15/2021 appears ready for extubation Type 2 diabetes  with hyperglycemia Constipation, concern for reflux of TF At risk malnutrition  Acute anemia - stable  Continues to make progress.  Generalized weakness and lack of renal recovery are major hurdles at  present.  Plan:  -Continue to wean oxygen to keep saturations greater than 92%. -Encourage incentive spirometry, continue chest physiotherapy given generalized weakness. -Elective CT head, stroke is of age indeterminate. -Continue CRRT as per heart failure/nephrology -Trial of diuresis today.  Foley catheter placed. -Patient is off norepinephrine now.  Continue dobutamine at current rate.  Follow SCV O2 -Begin to down titrate enteral sedation as affecting blood pressure and may be causing oversedation. -To tube feeds at goal.  Added simethicone to decrease gas production. -Transition to enteral amiodarone. -Transfuse 1 unit PRBC today -Increased tube feed coverage insulin   Best Practice    Diet/type: tubefeeds per PEPUP protocol DVT prophylaxis: prophylactic heparin  GI prophylaxis: PPI Lines: Central line Foley:  Yes, and it is still needed Code Status:  full code Last date of multidisciplinary goals of care discussion [sister updated at bedside today by Dr. Dinita Migliaccio.]  CRITICAL CARE Performed by: [name]   Total critical care time: 40 minutes  Critical care time was exclusive of separately billable procedures and treating other patients.  Critical care was necessary to treat or prevent imminent or life-threatening deterioration.  Critical care was time spent personally by me on the following activities: development of treatment plan with patient and/or surrogate as well as nursing, discussions with consultants, evaluation of patient's response to treatment, examination of patient, obtaining history from patient or surrogate, ordering and performing treatments and interventions, ordering and review of laboratory studies, ordering and review of radiographic studies, pulse oximetry, re-evaluation of patient's condition and participation in multidisciplinary rounds.  [name], MD [facility] ICU Physician [place]  Pager: [phone] Mobile:  [phone] After hours: [phone].  07/16/2021, 12:35 PM'

## 2021-07-16 NOTE — Progress Notes (Signed)
Nutrition Follow-up  DOCUMENTATION CODES:   Not applicable  INTERVENTION:   Tube Feeding via Cortrak:  Vital 1.5 at 60 ml/hr Pro-Source TF 45 mL QID Provides 2320 kcals, 141 g of protein and 1094 mL  Consider decreasing bowel regimen or changing to PRN given liquid stool via rectal tube  NUTRITION DIAGNOSIS:   Increased nutrient needs related to acute illness (AKI requiring CRRT) as evidenced by estimated needs.  Being addressed via TF   GOAL:   Patient will meet greater than or equal to 90% of their needs  Progressing  MONITOR:   Vent status, Labs, Weight trends, TF tolerance, I & O's  REASON FOR ASSESSMENT:   Ventilator, Consult Enteral/tube feeding initiation and management  ASSESSMENT:   55 year old male who presented to the ED on 10/24 with STEMI. PMH of atrial fibrillation, HTN, T2DM, depression, anxiety, CKD stage II.  10/24 - s/p LHC with PCI, found to have severe multivessel CAD 10/25 - s/p CABG x 4, Maze 10/26 - extubated 10/27 - intubated, CRRT start, trickle tube feeds started 10/30 - trickle tube feeds on hold due to concern for reflux of tube feeds 10/31 - Cortrak placed (tip post-pyloric), trickle tube feeds restarted, Rectal tube placed 11/01 -  Extubated  Pt remains very weak, remains NPO, SLP following with plans for FEES tomorrow  Remains on CRRT with plans for aggressive fluid removal today  Tolerating Vital 1.5 at 55 ml/hr with Pro-Source TF 90 mL QID  Rectal tube in place; 400 mL documented this AM  Chest tubes with 70 mL out  Labs: CBGs 197-230, Creatinine 3.36, BUN 27, sodium 133,  Meds: B complex with C, colace. Reglan, MVI liquid  Diet Order:   Diet Order             Diet NPO time specified  Diet effective now                   EDUCATION NEEDS:   Not appropriate for education at this time  Skin:  Skin Assessment: Skin Integrity Issues: Skin Integrity Issues:: Incisions Incisions: chest, L leg, R arm  Last BM:   11/2 rectal tube  Height:   Ht Readings from Last 1 Encounters:  07/14/21 6\' 2"  (1.88 m)    Weight:   Wt Readings from Last 1 Encounters:  07/16/21 118.9 kg    Ideal Body Weight:  86.4 kg  BMI:  Body mass index is 33.66 kg/m.  Estimated Nutritional Needs:   Kcal:  2200-2500 kcals  Protein:  130-160 g  Fluid:  >/= 2.0 L (limit fluid in nutrition support as much as possible)  Kerman Passey MS, RDN, LDN, CNSC Registered Dietitian III Clinical Nutrition RD Pager and On-Call Pager Number Located in [place]

## 2021-07-16 NOTE — Progress Notes (Signed)
Inpatient Diabetes Program Recommendations  AACE/ADA: New Consensus Statement on Inpatient Glycemic Control (2015)  Target Ranges:  Prepandial:   less than 140 mg/dL      Peak postprandial:   less than 180 mg/dL (1-2 hours)      Critically ill patients:  140 - 180 mg/dL   Lab Results  Component Value Date   GLUCAP 197 (H) 07/16/2021   HGBA1C 6.7 (H) 07/07/2021    Review of Glycemic Control Results for Billy [name], Billy [name] (MRN [id number]) as of 07/16/2021 07:59  Ref. Range 07/15/2021 16:14 07/15/2021 20:06 07/15/2021 23:36 07/16/2021 04:22 07/16/2021 07:53  Glucose-Capillary Latest Ref Range: 70 - 99 mg/dL 229 (H) 230 (H) 226 (H) 205 (H) 197 (H)    Inpatient Diabetes Program Recommendations:   Consider: -Increase Novolog tube feed coverage to 4 units q 4 hrs. (Hold if tube feed held or stopped for any reason)  Thank you, Bethena Roys E. Shawny Borkowski, RN, MSN, CDE  Diabetes Coordinator Inpatient Glycemic Control Team Team Pager [phone] (8am-5pm) 07/16/2021 8:00 AM

## 2021-07-16 NOTE — Progress Notes (Signed)
[address]       [place] [address]             [phone]                 8 Days Post-Op Procedure(s) (LRB): CORONARY ARTERY BYPASS GRAFTING (CABG) TIMES FOUR USING LEFT INTERNAL MAMMARY ARTERY, GREATER SAPHENOUS VEIN HARVESTED ENDOSCOPICALLY, AND LEFT RADIAL ARTERY HARVESTED OPEN (N/A) TRANSESOPHAGEAL ECHOCARDIOGRAM (TEE) (N/A) RADIAL ARTERY HARVEST (Left) ENDOVEIN HARVEST OF GREATER SAPHENOUS VEIN (Right) MAZE   Events: No events Making some urine, required straight cath overnight  _______________________________________________________________ Vitals: BP 138/66   Pulse 94   Temp 98.4 F (36.9 C) (Oral)   Resp (!) 24   Ht 6\' 2"  (1.88 m)   Wt 118.9 kg   SpO2 99%   BMI 33.66 kg/m  Filed Weights   07/14/21 0600 07/15/21 0500 07/16/21 0432  Weight: 119.8 kg 119 kg 118.9 kg     - Neuro: alert NAD  - Cardiovascular: sinus  Drips:  dob 2.5.  CVP:  [5 mmHg-11 mmHg] 11 mmHg  - Pulm:  Coarse, EWOB  ABG    Component Value Date/Time   PHART 7.353 07/14/2021 0744   PCO2ART 45.6 07/14/2021 0744   PO2ART 107 07/14/2021 0744   HCO3 25.4 07/14/2021 0744   TCO2 27 07/14/2021 0744   ACIDBASEDEF 9.0 (H) 07/10/2021 0303   O2SAT 50.5 07/16/2021 0415    - Abd: Soft - Extremity: warm  .Intake/Output      11/01 0701 11/02 0700 11/02 0701 11/03 0700   I.V. (mL/kg) 985.1 (8.3)    NG/GT 953.3    IV Piggyback     Total Intake(mL/kg) 1938.4 (16.3)    Urine (mL/kg/hr) 400 (0.1)    Other 2528    Chest Tube 70    Total Output 2998    Net -1059.6            _______________________________________________________________ Labs: CBC Latest Ref Rng & Units 07/16/2021 07/15/2021 07/14/2021  WBC 4.0 - 10.5 K/uL 12.3(H) 11.8(H) -  Hemoglobin 13.0 - 17.0 g/dL 7.1(L) 7.9(L) 8.2(L)  Hematocrit 39.0 - 52.0 % 22.6(L) 24.5(L) 24.0(L)  Platelets 150 - 400 K/uL 222 196 -   CMP Latest Ref Rng & Units 07/16/2021 07/15/2021 07/15/2021  Glucose 70 - 99 mg/dL  237(H) 239(H) 276(H)  BUN 6 - 20 mg/dL 27(H) 23(H) 24(H)  Creatinine 0.61 - 1.24 mg/dL 3.36(H) 3.14(H) 3.08(H)  Sodium 135 - 145 mmol/L 133(L) 134(L) 132(L)  Potassium 3.5 - 5.1 mmol/L 4.0 4.5 4.9  Chloride 98 - 111 mmol/L 101 103 102  CO2 22 - 32 mmol/L 24 22 23   Calcium 8.9 - 10.3 mg/dL 7.6(L) 7.6(L) 7.6(L)  Total Protein 6.5 - 8.1 g/dL - - -  Total Bilirubin 0.3 - 1.2 mg/dL - - -  Alkaline Phos 38 - 126 U/L - - -  AST 15 - 41 U/L - - -  ALT 0 - 44 U/L - - -    CXR: -large gastric bubble  _______________________________________________________________  Assessment and Plan: POD 8 s/p emergency CABG  Neuro: pain controlled. CV: on dobutamine.  On amio PO  Remains in sinus.  Wean dob as tolerated Pulm: aggressive pulm toilet Renal: starting to make urine.  Hopefully can transition to IHD GI: swallow eval, and start diet today Heme: would transfuse 1 unit pRBCs ID: afebrile.   Endo: SSI Dispo: continue ICU care.   Lajuana Matte 07/16/2021 7:13 AM

## 2021-07-16 NOTE — Progress Notes (Signed)
CSW attempted to visit the patient at bedside to introduce self as the heart failure social worker and to complete a very brief SDOH screening with the patient to address social needs and discuss discharge plans as needed however the patient was receiving a bath and busy at the moment and CSW will follow up later on at another time.   CSW will continue to follow for discharge needs and will check back with the patient at another time.  Shama Monfils, MSW, [place] Heart Failure Social Worker

## 2021-07-16 NOTE — Progress Notes (Signed)
[facility] Progress Note   Subjective: The patient was extubated.  Norepinephrine titrated off.  Only on dobutamine.  Did have some urine output at 400 cc over the past 24 hours.  Objective Vitals:   07/16/21 0915 07/16/21 1000 07/16/21 1037 07/16/21 1055  BP: 117/61 103/66    Pulse: 87 87 88 78  Resp: 20 (!) 27 (!) 30 (!) 24  Temp: 99.3 F (37.4 C)     TempSrc: Oral     SpO2: 100% 99% 95% 99%  Weight:      Height:       Physical Exam General: Awake, alert, lying in bed Heart: normal rate Lungs: Bilateral chest rise, coarse upper airway sounds Abdomen: soft, nontender Extremities: trace edema  Additional Objective Labs: Basic Metabolic Panel: Recent Labs  Lab 07/15/21 0455 07/15/21 1615 07/16/21 0415  NA 132* 134* 133*  K 4.9 4.5 4.0  CL 102 103 101  CO2 23 22 24   GLUCOSE 276* 239* 237*  BUN 24* 23* 27*  CREATININE 3.08* 3.14* 3.36*  CALCIUM 7.6* 7.6* 7.6*  PHOS 3.9 3.1 2.5  2.5   Liver Function Tests: Recent Labs  Lab 07/10/21 0937 07/10/21 1555 07/15/21 0455 07/15/21 1615 07/16/21 0415  AST 29  --   --   --   --   ALT 5  --   --   --   --   ALKPHOS 40  --   --   --   --   BILITOT 0.6  --   --   --   --   PROT 5.2*  --   --   --   --   ALBUMIN 2.3*   < > 1.8* 1.7* 1.7*   < > = values in this interval not displayed.   No results for input(s): LIPASE, AMYLASE in the last 168 hours. CBC: Recent Labs  Lab 07/11/21 0413 07/11/21 2035 07/12/21 0426 07/13/21 0237 07/14/21 0326 07/14/21 0358 07/14/21 0744 07/15/21 0455 07/16/21 0415  WBC 13.0*  --  11.0* 11.2* 9.2  --   --  11.8* 12.3*  NEUTROABS 10.3*  --   --   --   --   --   --   --  10.6*  HGB 8.4*   < > 9.0* 9.0* 8.3*   < > 8.2* 7.9* 7.1*  HCT 24.7*   < > 27.0* 27.4* 26.1*   < > 24.0* 24.5* 22.6*  MCV 94.3  --  94.7 96.1 96.7  --   --  97.2 97.8  PLT 148*  --  196 239 178  --   --  196 222   < > = values in this interval not displayed.   Blood Culture    Component  Value Date/Time   SDES TRACHEAL ASPIRATE 07/12/2021 0817   SPECREQUEST NONE 07/12/2021 0817   CULT  07/12/2021 0817    Normal respiratory flora-no Staph aureus or Pseudomonas seen Performed at [facility] [address] [place], [place] [address]    REPTSTATUS 07/14/2021 FINAL 07/12/2021 0817    Cardiac Enzymes: No results for input(s): CKTOTAL, CKMB, CKMBINDEX, TROPONINI in the last 168 hours. CBG: Recent Labs  Lab 07/15/21 1614 07/15/21 2006 07/15/21 2336 07/16/21 0422 07/16/21 0753  GLUCAP 229* 230* 226* 205* 197*   Iron Studies: No results for input(s): IRON, TIBC, TRANSFERRIN, FERRITIN in the last 72 hours. @lablastinr3 @ Studies/Results: DG Chest Port 1 View  Result Date: 07/16/2021 CLINICAL DATA:  Chest tube Status  post STEMI Shortness of breath EXAM: PORTABLE CHEST 1 VIEW COMPARISON:  07/25/2021 FINDINGS: Unchanged cardiomegaly. No significant pulmonary vascular congestion. Interval elevation of the left hemidiaphragm with left basilar atelectasis. Nasogastric tube extends below the left hemidiaphragm. The distal tip is not included in the study. Right IJ central venous catheter is unchanged position. There has been interval removal of the endotracheal tube. Left IJ dialysis catheter terminates in the region of the left brachiocephalic vein, unchanged from prior exam. Median sternotomy changes are again seen. IMPRESSION: 1. Interval elevation of the left hemidiaphragm likely result of gaseous distension of the stomach. 2. Left basilar atelectasis again noted. 3. Unchanged cardiomegaly. Electronically Signed   By: Miachel Roux M.D.   On: 07/16/2021 08:23   DG CHEST PORT 1 VIEW  Result Date: 07/15/2021 CLINICAL DATA:  Chest tube, respiratory failure EXAM: PORTABLE CHEST 1 VIEW COMPARISON:  Chest x-ray 07/14/2021 FINDINGS: Endotracheal tube tip is approximately 3.7 cm above the carina. Enteric tube tip is below the diaphragm. Right internal jugular line tip is in the SVC.  Left internal jugular line tip is near the proximal SVC. Mediastinal drain is unchanged. Cardiac surgical changes and median sternotomy wires. Cardiomediastinal silhouette is unchanged. Improved aeration of the lower lung zones since previous study with mild hazy and linear opacities. Likely trace left pleural effusion. No pneumothorax visualized. IMPRESSION: 1. Medical devices as described. 2. Improved aeration of the lower lung zones since previous study with persistent mild opacities and likely trace left effusion. Electronically Signed   By: Ofilia Neas M.D.   On: 07/15/2021 08:29   DG Abd Portable 1V  Result Date: 07/14/2021 CLINICAL DATA:  Feeding tube placement. EXAM: PORTABLE ABDOMEN - 1 VIEW COMPARISON:  Abdominal x-ray from same day at 1131 hours. FINDINGS: Interval advancement of the feeding tube with the tip now at the junction of the third and fourth portions of the duodenum. Normal bowel gas pattern. IMPRESSION: 1. Interval feeding tube advancement with the tip now in the distal duodenum. Electronically Signed   By: Titus Dubin M.D.   On: 07/14/2021 13:20   DG Abd Portable 1V  Result Date: 07/14/2021 CLINICAL DATA:  Feeding tube placement EXAM: PORTABLE ABDOMEN - 1 VIEW COMPARISON:  07/12/2021 FINDINGS: Soft feeding tube enters the stomach in has its tip in the fundus. Regional gas pattern unremarkable. IMPRESSION: Soft feeding tube tip in the fundus of the stomach. Electronically Signed   By: Nelson Chimes M.D.   On: 07/14/2021 11:37   Medications:   prismasol BGK 4/2.5 400 mL/hr at 07/16/21 0904    prismasol BGK 4/2.5 300 mL/hr at 07/15/21 1733   sodium chloride     sodium chloride Stopped (07/13/21 0234)   sodium chloride     albumin human 12.5 g (07/08/21 1827)   And   sodium chloride Stopped (07/08/21 2051)   DOBUTamine 2.5 mcg/kg/min (07/16/21 1000)   feeding supplement (VITAL 1.5 CAL) 50 mL/hr at 07/16/21 0400   furosemide     lactated ringers Stopped (07/16/21  0848)   prismasol BGK 4/2.5 1,500 mL/hr at 07/16/21 0815   promethazine (PHENERGAN) injection (IM or IVPB) Stopped (07/09/21 1628)    amiodarone  200 mg Per Tube BID   Followed by   Derrill Memo ON 07/23/2021] amiodarone  200 mg Per Tube Daily   aspirin  81 mg Per Tube Daily   Or   aspirin EC  81 mg Oral Daily   atorvastatin  80 mg Oral Daily   B-complex with vitamin  C  1 tablet Per Tube Daily   Chlorhexidine Gluconate Cloth  6 each Topical Daily   clonazePAM  0.5 mg Per Tube BID   clopidogrel  75 mg Per Tube Daily   docusate  100 mg Per Tube BID   feeding supplement (PROSource TF)  90 mL Per Tube QID   heparin injection (subcutaneous)  5,000 Units Subcutaneous Q8H   insulin aspart  3-9 Units Subcutaneous Q4H   insulin aspart  6 Units Subcutaneous Q4H   insulin detemir  20 Units Subcutaneous BID   mouth rinse  15 mL Mouth Rinse BID   metoCLOPramide (REGLAN) injection  5 mg Intravenous Q8H   multivitamin  15 mL Per Tube Daily   pantoprazole sodium  40 mg Per Tube Daily   polyethylene glycol  17 g Per Tube BID   QUEtiapine  25 mg Per Tube BID   sennosides  10 mL Per Tube BID   sodium chloride flush  10-40 mL Intracatheter Q12H   sodium chloride flush  3 mL Intravenous Q12H     Assessment/Plan: 55yo M DM, HTN, A fib, CKD currently being followed for severe AKI s/p CABG x 4 and MAZE.   **Severe AKI on CKD 3b:  Baseline 1.6 (Followed by Dr. Moshe Cipro outpt), was 1.9 prior to CABG and now AKI in setting of hemodynamic instability post op likely ATN but cholesterol embolic injury possible.  Initiated CRRT 10/27 for UF with ~anuria and volume overload.  Now extubated with improvement. UOP picking up which is a great sine. Possible no restart CRRT once the patient is off dobutamine  **Anemia: Hb  lower at 7 today, transfuse PRN.    **Hyponatremia: Mild with sodium of 133.  Continue with volume removal as tolerated  **CAD s/p CABG  **A fib: amiodarone, anticoag.  **DM: per  primary  **Shock: Improved only on dobutamine. Continue per primary  **Pneumonia: Antibiotics per primary team
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
## 2021-07-16 NOTE — Evaluation (Signed)
Occupational Therapy Evaluation Patient Details Name: Billy [name] MRN: [id number] DOB: 1966/08/13 Today's Date: 07/16/2021   History of Present Illness Pt adm 10/24 with STEMI. Pt initially refused cardiac stenting but later changed his mind and underwnet LHC with PCI. Pt with severe 3V CAD and underwent CABG x 4  and MAZE on 10/25. Pt extubated 10/26. Reintubated 10/27. Extubated 11/1. CRRT initiated 10/27. PMH - visually impaired, afib, HTN, DM, neuropathy, anxiety, rt toe amputation   Clinical Impression   Pt was living alone with his sister assisting with transportation. Pt has very poor vision at baseline. He presents with lethargy, significant generalized weakness and R hemiparesis. He requires +2 total assistance for bed level mobility and is dependent in all ADL. Recommending SNF level rehab upon discharge.     Recommendations for follow up therapy are one component of a multi-disciplinary discharge planning process, led by the attending physician.  Recommendations may be updated based on patient status, additional functional criteria and insurance authorization.   Follow Up Recommendations  Skilled nursing-short term rehab (<3 hours/day)    Assistance Recommended at Discharge Frequent or constant Supervision/Assistance  Functional Status Assessment  Patient has had a recent decline in their functional status and/or demonstrates limited ability to make significant improvements in function in a reasonable and predictable amount of time  Equipment Recommendations  Hospital bed;Wheelchair (measurements OT);Wheelchair cushion (measurements OT);BSC;Other (comment) (lift equipment)    Recommendations for Other Services       Precautions / Restrictions Precautions Precautions: Fall;Other (comment) Precaution Comments: visually impaired,CRRT, chest tube      Mobility Bed Mobility Overal bed mobility: Needs Assistance Bed Mobility: Supine to Sit     Supine to sit: +2 for  physical assistance;Total assist     General bed mobility comments: +2 total to bring trunk forward into long sit from chair position in bed.    Transfers                   General transfer comment: Not attempted      Balance Overall balance assessment: Needs assistance     Sitting balance - Comments: In chair position in bed pt with rt lateral lean and unable to correct without total assist                                   ADL either performed or assessed with clinical judgement   ADL                                         General ADL Comments: total assist, cortrak     Vision Baseline Vision/History: 1 Wears glasses Ability to See in Adequate Light: 4 Severely impaired Patient Visual Report: No change from baseline       Perception     Praxis      Pertinent Vitals/Pain Pain Assessment: No/denies pain Facial Expression: Relaxed, neutral Body Movements: Absence of movements Muscle Tension: Relaxed Compliance with ventilator (intubated pts.): N/A Vocalization (extubated pts.): Talking in normal tone or no sound CPOT Total: 0     Hand Dominance Right   Extremity/Trunk Assessment Upper Extremity Assessment Upper Extremity Assessment: RUE deficits/detail;LUE deficits/detail RUE Deficits / Details: 0/5, reports he could feel nailbed squeezed RUE Coordination: decreased fine motor;decreased gross motor LUE Deficits / Details: 2-/5 LUE Coordination: decreased  fine motor;decreased gross motor   Lower Extremity Assessment Lower Extremity Assessment: Defer to PT evaluation RLE Deficits / Details: Active movement with assist against gravity RLE Sensation: history of peripheral neuropathy LLE Deficits / Details: Active movement with assist against gravity LLE Sensation: history of peripheral neuropathy   Cervical / Trunk Assessment Cervical / Trunk Assessment: Other exceptions Cervical / Trunk Exceptions: weakness,  maintains head rotated and flexed toward L, can bring to neutral when cued   Communication Communication Communication: Expressive difficulties (low volume, minimal verbalization)   Cognition Arousal/Alertness: Lethargic Behavior During Therapy: Flat affect Overall Cognitive Status: Difficult to assess                                 General Comments: Followed 1 step commands inconsistently with incr time. Aroused brief periods but quickly back to sleep     General Comments  VSS    Exercises     Shoulder Instructions      Home Living Family/patient expects to be discharged to:: Skilled nursing facility Living Arrangements: Alone Available Help at Discharge: Family;Available PRN/intermittently Type of Home: House Home Access: Stairs to enter [employer] of Steps: 1   Home Layout: One level     Bathroom Shower/Tub: Occupational psychologist: Handicapped height     Home Equipment: None   Additional Comments: diabetic retinopathy, cannot read small print or see detail, can read very large print with his glasses      Prior Functioning/Environment Prior Level of Function : Independent/Modified Independent             Mobility Comments: Likes to work in the yard. Rides his bike to [place] and other places, sister [name] provides transportation          OT Problem List: Decreased strength;Decreased activity tolerance;Impaired balance (sitting and/or standing);Decreased coordination;Decreased cognition;Decreased knowledge of use of DME or AE;Impaired UE functional use;Obesity      OT Treatment/Interventions: Self-care/ADL training;Neuromuscular education;DME and/or AE instruction;Therapeutic activities;Cognitive remediation/compensation;Visual/perceptual remediation/compensation;Patient/family education;Balance training    OT Goals(Current goals can be found in the care plan section) Acute Rehab OT Goals OT Goal Formulation: With  family Time For Goal Achievement: 07/30/21 Potential to Achieve Goals: Fair ADL Goals Pt Will Perform Grooming: with mod assist;sitting Pt Will Perform Upper Body Dressing: with mod assist;sitting Pt/caregiver will Perform Home Exercise Program: Increased strength;Right Upper extremity;With minimal assist Additional ADL Goal #1: Pt will perform bed mobility with mod assist for pressure relief and ADL. Additional ADL Goal #2: Pt will demonstrate fair sitting balance at EOB x 5 minutes in preparation for ADL.  OT Frequency: Min 2X/week   Barriers to D/C: Decreased caregiver support          Co-evaluation PT/OT/SLP Co-Evaluation/Treatment: Yes Reason for Co-Treatment: Complexity of the patient's impairments (multi-system involvement);For patient/therapist safety PT goals addressed during session: Strengthening/ROM OT goals addressed during session: Strengthening/ROM      AM-PAC OT "6 Clicks" Daily Activity     Outcome Measure Help from another person eating meals?: Total Help from another person taking care of personal grooming?: Total Help from another person toileting, which includes using toliet, bedpan, or urinal?: Total Help from another person bathing (including washing, rinsing, drying)?: Total Help from another person to put on and taking off regular upper body clothing?: Total Help from another person to put on and taking off regular lower body clothing?: Total 6 Click Score: 6  End of Session Nurse Communication: Mobility status  Activity Tolerance: Patient limited by lethargy Patient left: in bed;with call bell/phone within reach;with family/visitor present;with nursing/sitter in room  OT Visit Diagnosis: Muscle weakness (generalized) (M62.81);Hemiplegia and hemiparesis Hemiplegia - Right/Left: Right Hemiplegia - dominant/non-dominant: Dominant Hemiplegia - caused by: Unspecified                Time: 8502-7741 OT Time Calculation (min): 27 min Charges:  OT  General Charges $OT Visit: 1 Visit OT Evaluation $OT Eval Moderate Complexity: 1 Mod  [name], OTR/L Acute Rehabilitation Services Pager: [phone] Office: [phone]  [name] 07/16/2021, 1:59 PM

## 2021-07-16 NOTE — Evaluation (Signed)
Physical Therapy Evaluation Patient Details Name: Billy [name] MRN: [id number] DOB: 03-13-1966 Today's Date: 07/16/2021  History of Present Illness  Pt adm 10/24 with STEMI. Pt initially refused cardiac stenting but later changed his mind and underwnet LHC with PCI. Pt with severe 3V CAD and underwent CABG x 4  and MAZE on 10/25. Pt extubated 10/26. Reintubated 10/27. Extubated 11/1. CRRT initiated 10/27. PMH - visually impaired, afib, HTN, DM, neuropathy, anxiety, rt toe amputation  Clinical Impression  Pt presents to PT with severe deficits in mobility, strength, and balance. Expect pt will make slow, steady progress. Lives alone and has intermittent support. Recommend SNF for further rehab.        Recommendations for follow up therapy are one component of a multi-disciplinary discharge planning process, led by the attending physician.  Recommendations may be updated based on patient status, additional functional criteria and insurance authorization.  Follow Up Recommendations Skilled nursing-short term rehab (<3 hours/day)    Assistance Recommended at Discharge Frequent or constant Supervision/Assistance  Functional Status Assessment Patient has had a recent decline in their functional status and demonstrates the ability to make significant improvements in function in a reasonable and predictable amount of time.  Equipment Recommendations  Other (comment) (To be determined)    Recommendations for Other Services       Precautions / Restrictions Precautions Precautions: Fall;Other (comment) Precaution Comments: Cvisually impaired, RRT, chest tube      Mobility  Bed Mobility Overal bed mobility: Needs Assistance Bed Mobility: Supine to Sit     Supine to sit: +2 for physical assistance;Total assist     General bed mobility comments: +2 total to bring trunk forward into long sit from chair position in bed.    Transfers                   General transfer comment: Not  attempted    Ambulation/Gait                Stairs            Wheelchair Mobility    Modified Rankin (Stroke Patients Only)       Balance Overall balance assessment: Needs assistance     Sitting balance - Comments: In chair position in bed pt with rt lateral lean and unable to correct without total assist                                     Pertinent Vitals/Pain Pain Assessment: CPOT Facial Expression: Relaxed, neutral Body Movements: Absence of movements Muscle Tension: Relaxed Compliance with ventilator (intubated pts.): N/A Vocalization (extubated pts.): Talking in normal tone or no sound CPOT Total: 0    Home Living Family/patient expects to be discharged to:: Skilled nursing facility Living Arrangements: Alone Available Help at Discharge: Family;Available PRN/intermittently Type of Home: House Home Access: Stairs to enter   [employer] of Steps: 1   Home Layout: One level Home Equipment: None Additional Comments: Severe vsual impairment with the use of glassess for reading    Prior Function Prior Level of Function : Independent/Modified Independent             Mobility Comments: Likes to work in the yard. Rides his bike to [place] and other places       Hand Dominance   Dominant Hand: Right    Extremity/Trunk Assessment   Upper Extremity Assessment Upper Extremity Assessment:  Defer to OT evaluation    Lower Extremity Assessment Lower Extremity Assessment: RLE deficits/detail;LLE deficits/detail RLE Deficits / Details: Active movement with assist against gravity RLE Sensation: history of peripheral neuropathy LLE Deficits / Details: Active movement with assist against gravity LLE Sensation: history of peripheral neuropathy    Cervical / Trunk Assessment Cervical / Trunk Assessment: Other exceptions (Pt with head turned to the rt and rt lateral flexion. Able to achieve midline with verbal cues and assist  to turn.)  Communication   Communication: Other (comment) (low volume post extubation)  Cognition Arousal/Alertness: Lethargic Behavior During Therapy: Flat affect Overall Cognitive Status: Difficult to assess                                 General Comments: Followed 1 step commands inconsistently with incr time. Aroused brief periods but quickly back to sleep        General Comments General comments (skin integrity, edema, etc.): VSS    Exercises     Assessment/Plan    PT Assessment Patient needs continued PT services  PT Problem List Decreased strength;Decreased range of motion;Decreased activity tolerance;Decreased balance;Decreased mobility;Decreased cognition;Impaired sensation       PT Treatment Interventions DME instruction;Gait training;Functional mobility training;Therapeutic activities;Therapeutic exercise;Balance training;Cognitive remediation;Patient/family education;Wheelchair mobility training    PT Goals (Current goals can be found in the Care Plan section)  Acute Rehab PT Goals Patient Stated Goal: not stated PT Goal Formulation: With patient/family Time For Goal Achievement: 07/30/21 Potential to Achieve Goals: Fair    Frequency Min 3X/week   Barriers to discharge Decreased caregiver support lives alone    Co-evaluation PT/OT/SLP Co-Evaluation/Treatment: Yes Reason for Co-Treatment: Complexity of the patient's impairments (multi-system involvement);For patient/therapist safety PT goals addressed during session: Strengthening/ROM         AM-PAC PT "6 Clicks" Mobility  Outcome Measure Help needed turning from your back to your side while in a flat bed without using bedrails?: Total Help needed moving from lying on your back to sitting on the side of a flat bed without using bedrails?: Total Help needed moving to and from a bed to a chair (including a wheelchair)?: Total Help needed standing up from a chair using your arms (e.g.,  wheelchair or bedside chair)?: Total Help needed to walk in hospital room?: Total Help needed climbing 3-5 steps with a railing? : Total 6 Click Score: 6    End of Session Equipment Utilized During Treatment: Oxygen Activity Tolerance: Patient limited by lethargy;Patient limited by fatigue Patient left: in bed;with call bell/phone within reach;with nursing/sitter in room;with family/visitor present   PT Visit Diagnosis: Other abnormalities of gait and mobility (R26.89);Muscle weakness (generalized) (M62.81)    Time: 4360-6770 PT Time Calculation (min) (ACUTE ONLY): 24 min   Charges:   PT Evaluation $PT Eval Moderate Complexity: [place] Pager [phone] Office [place] 07/16/2021, 1:01 PM

## 2021-07-17 ENCOUNTER — Inpatient Hospital Stay (HOSPITAL_COMMUNITY): Payer: Medicare HMO

## 2021-07-17 DIAGNOSIS — N179 Acute kidney failure, unspecified: Secondary | ICD-10-CM | POA: Diagnosis not present

## 2021-07-17 DIAGNOSIS — R570 Cardiogenic shock: Secondary | ICD-10-CM | POA: Diagnosis not present

## 2021-07-17 DIAGNOSIS — J9601 Acute respiratory failure with hypoxia: Secondary | ICD-10-CM | POA: Diagnosis not present

## 2021-07-17 DIAGNOSIS — I5043 Acute on chronic combined systolic (congestive) and diastolic (congestive) heart failure: Secondary | ICD-10-CM | POA: Diagnosis not present

## 2021-07-17 LAB — COOXEMETRY PANEL
Carboxyhemoglobin: 1.5 % (ref 0.5–1.5)
Carboxyhemoglobin: 1.4 % (ref 0.5–1.5)
Methemoglobin: 1.1 % (ref 0.0–1.5)
Methemoglobin: 0.6 % (ref 0.0–1.5)
O2 Saturation: 58 %
O2 Saturation: 67.9 %
Total hemoglobin: 5.9 g/dL — CL (ref 12.0–16.0)
Total hemoglobin: 7.6 g/dL — ABNORMAL LOW (ref 12.0–16.0)

## 2021-07-17 LAB — RENAL FUNCTION PANEL
Albumin: 2.1 g/dL — ABNORMAL LOW (ref 3.5–5.0)
Albumin: 2 g/dL — ABNORMAL LOW (ref 3.5–5.0)
Anion gap: 9 (ref 5–15)
Anion gap: 9 (ref 5–15)
BUN: 35 mg/dL — ABNORMAL HIGH (ref 6–20)
BUN: 37 mg/dL — ABNORMAL HIGH (ref 6–20)
CO2: 24 mmol/L (ref 22–32)
CO2: 26 mmol/L (ref 22–32)
Calcium: 7.9 mg/dL — ABNORMAL LOW (ref 8.9–10.3)
Calcium: 7.9 mg/dL — ABNORMAL LOW (ref 8.9–10.3)
Chloride: 105 mmol/L (ref 98–111)
Chloride: 103 mmol/L (ref 98–111)
Creatinine, Ser: 3.71 mg/dL — ABNORMAL HIGH (ref 0.61–1.24)
Creatinine, Ser: 3.66 mg/dL — ABNORMAL HIGH (ref 0.61–1.24)
GFR, Estimated: 18 mL/min — ABNORMAL LOW (ref >60)
GFR, Estimated: 19 mL/min — ABNORMAL LOW (ref >60)
Glucose, Bld: 156 mg/dL — ABNORMAL HIGH (ref 70–99)
Glucose, Bld: 241 mg/dL — ABNORMAL HIGH (ref 70–99)
Phosphorus: 2.6 mg/dL (ref 2.5–4.6)
Phosphorus: 2.6 mg/dL (ref 2.5–4.6)
Potassium: 4.3 mmol/L (ref 3.5–5.1)
Potassium: 4.4 mmol/L (ref 3.5–5.1)
Sodium: 138 mmol/L (ref 135–145)
Sodium: 138 mmol/L (ref 135–145)

## 2021-07-17 LAB — CBC
HCT: 26.3 % — ABNORMAL LOW (ref 39.0–52.0)
Hemoglobin: 8.4 g/dL — ABNORMAL LOW (ref 13.0–17.0)
MCH: 30.4 pg (ref 26.0–34.0)
MCHC: 31.9 g/dL (ref 30.0–36.0)
MCV: 95.3 fL (ref 80.0–100.0)
Platelets: 273 10*3/uL (ref 150–400)
RBC: 2.76 MIL/uL — ABNORMAL LOW (ref 4.22–5.81)
RDW: 13.9 % (ref 11.5–15.5)
WBC: 15.2 10*3/uL — ABNORMAL HIGH (ref 4.0–10.5)
nRBC: 0.1 % (ref 0.0–0.2)

## 2021-07-17 LAB — GLUCOSE, CAPILLARY
Glucose-Capillary: 156 mg/dL — ABNORMAL HIGH (ref 70–99)
Glucose-Capillary: 192 mg/dL — ABNORMAL HIGH (ref 70–99)
Glucose-Capillary: 224 mg/dL — ABNORMAL HIGH (ref 70–99)
Glucose-Capillary: 247 mg/dL — ABNORMAL HIGH (ref 70–99)

## 2021-07-17 LAB — TYPE AND SCREEN
ABO/RH(D): O NEG
Antibody Screen: NEGATIVE
Unit division: 0

## 2021-07-17 LAB — BPAM RBC
Blood Product Expiration Date: 202211122359
ISSUE DATE / TIME: 202211020852
Unit Type and Rh: 9500

## 2021-07-17 LAB — MAGNESIUM: Magnesium: 2.7 mg/dL — ABNORMAL HIGH (ref 1.7–2.4)

## 2021-07-17 MED ORDER — ADULT MULTIVITAMIN W/MINERALS CH
1.0000 | ORAL_TABLET | Freq: Every day | ORAL | Status: DC
  Administered 2021-07-18 – 2021-07-21 (×4): 1 via ORAL
  Filled 2021-07-17 (×4): qty 1

## 2021-07-17 MED ORDER — ALPRAZOLAM 0.5 MG PO TABS: 1.0000 mg | ORAL_TABLET | Freq: Three times a day (TID) | ORAL | Status: DC | PRN

## 2021-07-17 MED ORDER — CHLORHEXIDINE GLUCONATE 0.12 % MT SOLN
15.0000 mL | Freq: Two times a day (BID) | OROMUCOSAL | Status: DC
  Administered 2021-07-17 – 2021-08-05 (×37): 15 mL via OROMUCOSAL
  Filled 2021-07-17 (×34): qty 15

## 2021-07-17 MED ORDER — ALPRAZOLAM 0.5 MG PO TABS
1.0000 mg | ORAL_TABLET | Freq: Three times a day (TID) | ORAL | Status: DC
  Administered 2021-07-17 – 2021-08-06 (×56): 1 mg via ORAL
  Filled 2021-07-17 (×60): qty 2

## 2021-07-17 MED ORDER — ORAL CARE MOUTH RINSE
15.0000 mL | Freq: Two times a day (BID) | OROMUCOSAL | Status: DC
  Administered 2021-07-17 – 2021-08-05 (×30): 15 mL via OROMUCOSAL

## 2021-07-17 MED ORDER — AMIODARONE IV BOLUS ONLY 150 MG/100ML
150.0000 mg | Freq: Once | INTRAVENOUS | Status: AC
  Administered 2021-07-17: 150 mg via INTRAVENOUS
  Filled 2021-07-17: qty 100

## 2021-07-17 MED ORDER — TRAZODONE HCL 50 MG PO TABS
100.0000 mg | ORAL_TABLET | Freq: Every day | ORAL | Status: DC
  Administered 2021-07-18: 100 mg
  Filled 2021-07-17: qty 2

## 2021-07-17 MED ORDER — HEPARIN (PORCINE) 2000 UNITS/L FOR CRRT: INTRAVENOUS_CENTRAL | Status: DC | PRN

## 2021-07-17 MED ORDER — METOCLOPRAMIDE HCL 5 MG/ML IJ SOLN: 10.0000 mg | Freq: Four times a day (QID) | INTRAMUSCULAR | Status: DC

## 2021-07-17 MED ORDER — SODIUM CHLORIDE 0.9 % IV SOLN
250.0000 [IU]/h | INTRAVENOUS | Status: DC
  Administered 2021-07-17 – 2021-07-18 (×2): 500 [IU]/h via INTRAVENOUS_CENTRAL
  Filled 2021-07-17: qty 10000
  Filled 2021-07-17: qty 2

## 2021-07-17 MED ORDER — SERTRALINE HCL 25 MG PO TABS
50.0000 mg | ORAL_TABLET | Freq: Every day | ORAL | Status: DC
  Administered 2021-07-17 – 2021-08-06 (×21): 50 mg via ORAL
  Filled 2021-07-17: qty 1
  Filled 2021-07-17: qty 2
  Filled 2021-07-17 (×2): qty 1
  Filled 2021-07-17 (×3): qty 2
  Filled 2021-07-17: qty 1
  Filled 2021-07-17: qty 2
  Filled 2021-07-17: qty 1
  Filled 2021-07-17 (×11): qty 2

## 2021-07-17 MED ORDER — METOCLOPRAMIDE HCL 5 MG/ML IJ SOLN
5.0000 mg | Freq: Four times a day (QID) | INTRAMUSCULAR | Status: AC
  Administered 2021-07-17 – 2021-07-18 (×4): 5 mg via INTRAVENOUS
  Filled 2021-07-17 (×3): qty 2

## 2021-07-17 MED ORDER — ISOSORBIDE MONONITRATE ER 30 MG PO TB24: 30.0000 mg | ORAL_TABLET | Freq: Three times a day (TID) | ORAL | Status: DC

## 2021-07-17 MED ORDER — SENNOSIDES 8.8 MG/5ML PO SYRP
10.0000 mL | ORAL_SOLUTION | Freq: Every evening | ORAL | Status: DC | PRN
  Filled 2021-07-17: qty 10

## 2021-07-17 MED ORDER — ISOSORBIDE DINITRATE 10 MG PO TABS
10.0000 mg | ORAL_TABLET | Freq: Three times a day (TID) | ORAL | Status: DC
  Administered 2021-07-17 – 2021-07-29 (×32): 10 mg via ORAL
  Filled 2021-07-17 (×34): qty 1

## 2021-07-17 MED ORDER — AMIODARONE HCL IN DEXTROSE 360-4.14 MG/200ML-% IV SOLN
INTRAVENOUS | Status: AC
  Filled 2021-07-17: qty 200

## 2021-07-17 MED ORDER — POLYETHYLENE GLYCOL 3350 17 G PO PACK: 17.0000 g | PACK | Freq: Every day | ORAL | Status: DC | PRN

## 2021-07-17 MED ORDER — HYDRALAZINE HCL 25 MG PO TABS
12.5000 mg | ORAL_TABLET | Freq: Three times a day (TID) | ORAL | Status: DC
  Administered 2021-07-17 – 2021-08-06 (×56): 12.5 mg via ORAL
  Filled 2021-07-17 (×57): qty 1

## 2021-07-17 MED ORDER — DOCUSATE SODIUM 50 MG/5ML PO LIQD: 100.0000 mg | Freq: Two times a day (BID) | ORAL | Status: DC | PRN

## 2021-07-17 NOTE — Progress Notes (Signed)
NAME:  Billy [name], MRN:  [id number], DOB:  11-28-1965, LOS: 2 ADMISSION DATE:  07/07/2021, CONSULTATION DATE:  07/17/2021  REFERRING MD:  Lynnette Caffey, CHIEF COMPLAINT:  cardiogenic shock , on bipap   History of Present Illness:  55 year old diabetic, hypertensive admitted 10/24 with STEMI.  He had recent admission 10/11 for chest pain and hypertensive urgency with normal LVEF on echo.  He underwent left heart cath which showed three-vessel disease, underwent PCI with DES to left circumflex followed by CABG x4 with radial harvest and maze procedure.  Intra-Op EF was noted to be 20%.  He was extubated postop day 1 but developed progressive shock requiring Levophed and vasopressin with AKI and oliguria.  He was placed on BiPAP for hypoxia, ABG showed metabolic acidosis for which she was given 3 amps of bicarb and PCCM consulted Seen by heart failure service, coox 56% started on milrinone  Pertinent  Medical History  Diabetes Hypertension CKD stage II -Baseline creatinine 1.8 Paroxysmal atrial fibrillation Anxiety -on Xanax 2 mg thrice daily  Significant Hospital Events:   10/24 admitted with STEMI  10/27 PCCM consulted, due to respiratory issues intubated early morning   10/28 No acute events overnight, flipped back into NSR with less pressor requirement and decreased FIO2 this AM 10/30 increasing FiO2 again as fluid removal decreased.  Copious secretions. 11/1 extubated 11/2 off dobutamine.   Interim History / Subjective:   Passed swallow evaluation. Poor sleep.  No response to diuretic.   Objective   Blood pressure (!) 141/71, pulse 90, temperature 98.5 F (36.9 C), temperature source Oral, resp. rate (!) 37, height 6\' 2"  (1.88 m), weight 114.2 kg, SpO2 96 %.    FiO2 (%):  [32 %] 32 %   Intake/Output Summary (Last 24 hours) at 07/17/2021 0956 Last data filed at 07/17/2021 0900 Gross per 24 hour  Intake 2316.72 ml  Output 5070 ml  Net -2753.28 ml    Filed Weights    07/15/21 0500 07/16/21 0432 07/17/21 0700  Weight: 119 kg 118.9 kg 114.2 kg    Examination: General: Well-developed male who is alert and follows commands HEENT: MM pink/moist Neuro: Weak generally but more so on the right in both upper and lower extremities CV: + rub.  PULM: On 6 L high flow nasal cannula.  No respiratory distress.  Chest clear bilaterally.  Bronchial breath sounds are absent at the left base now.  Sternal incision is well-healed.  GI: soft, bsx4 active  Extremities: warm/dry, 2+ extremity edema  Skin: no rashes or lesions   Ancillary tests   Normal head CT.  ScvO2 - 67% post transfusion.   Assessment & Plan:   Critically ill due to expected mixed cardiogenic shock and distributive shock due to ischemic cardiomyopathy now status post three-vessel CABG.  Coronary artery disease with recent anterior STEMI  CABG x4 with radial harvest 07/09/2019 Ischemic cardiomyopathy normal EF Paroxsymal A-fib- back in NSR today Possible lacunar stroke with right hemiplegia Critically ill due to acute hypoxic respiratory failure due to acute pulmonary edema require mechanical ventilation; Concern for evolving aspiration PNA on cefepime Chronic benzo use, anxiety, severe agitation. High dose benzo use chronically PTA. Critically ill due to oliguric renal failure likely secondary to ATN requiring CRRT superimposed on CKD stage IIb 07/15/2021 appears ready for extubation Type 2 diabetes  with hyperglycemia Constipation, concern for reflux of TF At risk malnutrition  Acute anemia - stable  Continues to make progress.  Generalized weakness and lack of renal recovery  are major hurdles at present.  Plan:  -Continue to wean oxygen to keep saturations greater than 92%. -Encourage incentive spirometry, continue chest physiotherapy given generalized weakness. -Transition to IHD soon. No sign of renal recovery. Will need Permcath soon (Monday).  -Stopped dobutamine today.  - Will order  repeat echo for tomorrow to assess for cardiac recovery - Stop seroquel and resume home seroquel. Start Zoloft for anxiety - Cleared for oral intake, continue tube feeds.  -Increased tube feed coverage insulin   Best Practice    Diet/type: tubefeeds per PEPUP protocol - starting oral s DVT prophylaxis: prophylactic heparin  GI prophylaxis: PPI Lines: Central line Remove central and keep HD Foley:  removal ordered  Code Status:  full code Last date of multidisciplinary goals of care discussion [sister updated at bedside today by Dr. Tillman Kazmierski.]  CRITICAL CARE Performed by: [name]   Total critical care time: 40 minutes  Critical care time was exclusive of separately billable procedures and treating other patients.  Critical care was necessary to treat or prevent imminent or life-threatening deterioration.  Critical care was time spent personally by me on the following activities: development of treatment plan with patient and/or surrogate as well as nursing, discussions with consultants, evaluation of patient's response to treatment, examination of patient, obtaining history from patient or surrogate, ordering and performing treatments and interventions, ordering and review of laboratory studies, ordering and review of radiographic studies, pulse oximetry, re-evaluation of patient's condition and participation in multidisciplinary rounds.  [name], MD [facility] ICU Physician [place]  Pager: [phone] Mobile: [phone] After hours: [phone].  07/17/2021, 9:56 AM'

## 2021-07-17 NOTE — Progress Notes (Signed)
Inpatient Rehab Admissions Coordinator:   Per updated PT recommendations pt was screened for CIR by Shann Medal, PT, DPT.  Therapy evaluations indicate pt from home alone, with limited support at discharge.  Currently max/total assist +2 and IF [employer] Medicare did approve CIR they would not approve a SNF admission after CIR.  Will need to demonstrate improvement in mobility and/or confirmation of 24/7 assist to be considered for CIR.  I will not place an order at this time but will rescreen on Monday for progress if pt remains in house.   Shann Medal, PT, DPT Admissions Coordinator [phone] 07/17/21  4:39 PM

## 2021-07-17 NOTE — Progress Notes (Signed)
Alleen Borne RN aware of order to d/c cordis.  RN at bedside.

## 2021-07-17 NOTE — Progress Notes (Signed)
Speech Language Pathology Treatment: Dysphagia  Patient Details Name: Billy [name] MRN: [id number] DOB: 12/21/1965 Today's Date: 07/17/2021 Time: 0945-1000 SLP Time Calculation (min) (ACUTE ONLY): 15 min  Assessment / Plan / Recommendation Clinical Impression  Pt demonstrates signs of improved secretion management and airway protection today. Pt Is able to cough up secretions and self suction. Pt swallow ice without coughing, then passed a 3 oz water swallow and consumed further consecutive sips of juice and water without any coughing or wet vocal quality. He was also bale to consume purees and masticate a small bit of cookie. Recommend initiating a full liquid diet with potential to upgrade to regular solids in the short term. RN may provide meds orally as well as a trial for eventual Cortrak removal. Will f/u for tolerance.   HPI HPI: 55 year old man with acute hypoxic respiratory failure requiring mechanical ventilation and cardiogenic shock requiring titration of dobutamine and acute kidney injury requiring CRRT.  Now s/p three vessel CABG.  Admitted 10/24, intubated 10/27-11/1. Anxiety, concern for aspiration of TF and potential cerebral infarct given right sided weakness, though  no imaging yet.      SLP Plan  Continue with current plan of care      Recommendations for follow up therapy are one component of a multi-disciplinary discharge planning process, led by the attending physician.  Recommendations may be updated based on patient status, additional functional criteria and insurance authorization.    Recommendations  Diet recommendations: Thin liquid Liquids provided via: Cup;Straw Medication Administration: Via alternative means                Oral Care Recommendations: Oral care QID Follow up Recommendations: Inpatient Rehab SLP Visit Diagnosis: Dysphagia, pharyngeal phase (R13.13) Plan: Continue with current plan of care       GO                Zaelyn Noack,  Katherene Ponto  07/17/2021, 10:02 AM

## 2021-07-17 NOTE — Progress Notes (Signed)
[facility] Progress Note   Subjective: Awake and alert today.  Continues on dobutamine.  Did have some atrial fibrillation with RVR yesterday but in normal sinus rhythm currently.  No longer on antibiotics.  -2.5 L on CRRT yesterday with almost no urine output.  Objective Vitals:   07/17/21 0500 07/17/21 0600 07/17/21 0700 07/17/21 0800  BP: (!) 161/79 120/66  (!) 141/71  Pulse: 95 95 97 90  Resp: (!) 29 (!) 36 (!) 22 (!) 37  Temp:    98.5 F (36.9 C)  TempSrc:    Oral  SpO2: 93% 93% 97% 96%  Weight:   114.2 kg   Height:       Physical Exam General: Awake, alert, lying in bed Heart: normal rate Lungs: Bilateral chest rise, coarse upper airway sounds Abdomen: soft, nontender Extremities: trace edema  Additional Objective Labs: Basic Metabolic Panel: Recent Labs  Lab 07/16/21 0415 07/16/21 1330 07/16/21 1625 07/17/21 0514  NA 133* 137 136 138  K 4.0 4.2 4.0 4.3  CL 101  --  103 105  CO2 24  --  24 24  GLUCOSE 237*  --  221* 156*  BUN 27*  --  31* 35*  CREATININE 3.36*  --  3.40* 3.71*  CALCIUM 7.6*  --  7.8* 7.9*  PHOS 2.5  2.5  --  2.7 2.6   Liver Function Tests: Recent Labs  Lab 07/10/21 0937 07/10/21 1555 07/16/21 0415 07/16/21 1625 07/17/21 0514  AST 29  --   --   --   --   ALT 5  --   --   --   --   ALKPHOS 40  --   --   --   --   BILITOT 0.6  --   --   --   --   PROT 5.2*  --   --   --   --   ALBUMIN 2.3*   < > 1.7* 2.0* 2.1*   < > = values in this interval not displayed.   No results for input(s): LIPASE, AMYLASE in the last 168 hours. CBC: Recent Labs  Lab 07/11/21 0413 07/11/21 2035 07/13/21 0237 07/14/21 0326 07/14/21 0358 07/15/21 0455 07/16/21 0415 07/16/21 1330 07/16/21 1625 07/17/21 0514  WBC 13.0*   < > 11.2* 9.2  --  11.8* 12.3*  --   --  15.2*  NEUTROABS 10.3*  --   --   --   --   --  10.6*  --   --   --   HGB 8.4*   < > 9.0* 8.3*   < > 7.9* 7.1* 8.2* 8.4* 8.4*  HCT 24.7*   < > 27.4* 26.1*   < > 24.5* 22.6*  24.0* 25.4* 26.3*  MCV 94.3   < > 96.1 96.7  --  97.2 97.8  --   --  95.3  PLT 148*   < > 239 178  --  196 222  --   --  273   < > = values in this interval not displayed.   Blood Culture    Component Value Date/Time   SDES TRACHEAL ASPIRATE 07/12/2021 0817   SPECREQUEST NONE 07/12/2021 0817   CULT  07/12/2021 0817    Normal respiratory flora-no Staph aureus or Pseudomonas seen Performed at [facility] [address], [place], [place] [address]    REPTSTATUS 07/14/2021 FINAL 07/12/2021 0817    Cardiac Enzymes: No results for input(s): CKTOTAL, CKMB, CKMBINDEX, TROPONINI  in the last 168 hours. CBG: Recent Labs  Lab 07/16/21 1203 07/16/21 1552 07/16/21 1956 07/17/21 0005 07/17/21 0511  GLUCAP 237* 206* 187* 192* 156*   Iron Studies: No results for input(s): IRON, TIBC, TRANSFERRIN, FERRITIN in the last 72 hours. @lablastinr3 @ Studies/Results: CT HEAD WO CONTRAST (5MM)  Result Date: 07/17/2021 CLINICAL DATA:  Stroke follow-up EXAM: CT HEAD WITHOUT CONTRAST TECHNIQUE: Contiguous axial images were obtained from the base of the skull through the vertex without intravenous contrast. COMPARISON:  None. FINDINGS: Brain: There is no mass, hemorrhage or extra-axial collection. The size and configuration of the ventricles and extra-axial CSF spaces are normal. The brain parenchyma is normal, without acute or chronic infarction. Vascular: No abnormal hyperdensity of the major intracranial arteries or dural venous sinuses. No intracranial atherosclerosis. Skull: The visualized skull base, calvarium and extracranial soft tissues are normal. Sinuses/Orbits: No fluid levels or advanced mucosal thickening of the visualized paranasal sinuses. No mastoid or middle ear effusion. The orbits are normal. IMPRESSION: Normal head CT. Electronically Signed   By: Ulyses Jarred M.D.   On: 07/17/2021 02:37   DG Chest Port 1 View  Result Date: 07/16/2021 CLINICAL DATA:  Chest tube Status post STEMI  Shortness of breath EXAM: PORTABLE CHEST 1 VIEW COMPARISON:  07/25/2021 FINDINGS: Unchanged cardiomegaly. No significant pulmonary vascular congestion. Interval elevation of the left hemidiaphragm with left basilar atelectasis. Nasogastric tube extends below the left hemidiaphragm. The distal tip is not included in the study. Right IJ central venous catheter is unchanged position. There has been interval removal of the endotracheal tube. Left IJ dialysis catheter terminates in the region of the left brachiocephalic vein, unchanged from prior exam. Median sternotomy changes are again seen. IMPRESSION: 1. Interval elevation of the left hemidiaphragm likely result of gaseous distension of the stomach. 2. Left basilar atelectasis again noted. 3. Unchanged cardiomegaly. Electronically Signed   By: Miachel Roux M.D.   On: 07/16/2021 08:23   Medications:   prismasol BGK 4/2.5 400 mL/hr at 07/16/21 2151    prismasol BGK 4/2.5 300 mL/hr at 07/17/21 0544   sodium chloride Stopped (07/13/21 0234)   albumin human 12.5 g (07/08/21 1827)   And   sodium chloride Stopped (07/08/21 2051)   DOBUTamine 2.5 mcg/kg/min (07/17/21 0626)   feeding supplement (VITAL 1.5 CAL) 60 mL/hr at 07/17/21 0100   lactated ringers Stopped (07/16/21 0848)   norepinephrine (LEVOPHED) Adult infusion 0 mcg/min (07/17/21 0627)   prismasol BGK 4/2.5 1,500 mL/hr at 07/17/21 0543   promethazine (PHENERGAN) injection (IM or IVPB) Stopped (07/09/21 1628)    amiodarone  200 mg Per Tube BID   Followed by   Derrill Memo ON 07/23/2021] amiodarone  200 mg Per Tube Daily   aspirin  81 mg Per Tube Daily   Or   aspirin EC  81 mg Oral Daily   atorvastatin  80 mg Per Tube Daily   B-complex with vitamin C  1 tablet Per Tube Daily   chlorhexidine  15 mL Mouth Rinse BID   Chlorhexidine Gluconate Cloth  6 each Topical Daily   clonazePAM  0.25 mg Per Tube BID   clopidogrel  75 mg Per Tube Daily   feeding supplement (PROSource TF)  45 mL Per Tube QID    heparin injection (subcutaneous)  5,000 Units Subcutaneous Q8H   hydrALAZINE  12.5 mg Oral TID   insulin aspart  3-9 Units Subcutaneous Q4H   insulin aspart  6 Units Subcutaneous Q4H   insulin detemir  20 Units Subcutaneous  BID   isosorbide dinitrate  10 mg Oral TID   mouth rinse  15 mL Mouth Rinse q12n4p   multivitamin  15 mL Per Tube Daily   pantoprazole sodium  40 mg Per Tube Daily   QUEtiapine  25 mg Per Tube QHS   sodium chloride flush  10-40 mL Intracatheter Q12H   sodium chloride flush  3 mL Intravenous Q12H     Assessment/Plan: 55yo M DM, HTN, A fib, CKD currently being followed for severe AKI s/p CABG x 4 and MAZE.   **Severe AKI on CKD 3b:  Baseline 1.6 (Followed by Dr. Moshe Cipro outpt), was 1.9 prior to CABG and now AKI in setting of hemodynamic instability post op likely ATN but cholesterol embolic injury possible.  Initiated CRRT 10/27 for UF with ~anuria and volume overload.  Now extubated with improvement.  Urine output was better but now trailed off of the past 24 hours.  Good ultrafiltration on CRRT yesterday but CVP on the lower side today.  We will try to maintain even for 24 hours.  **Anemia: Hb  lower at 8.4 today, transfuse PRN.    **Hyponatremia: intermittent issue related to volume accumulation.  Normal today  **CAD s/p CABG  **A fib: amiodarone, anticoag.  **DM: per primary  **Shock: Improved only on dobutamine. Continue per primary  **Pneumonia: Was on cefepime.  Now stopped likely resolved.

## 2021-07-17 NOTE — Progress Notes (Signed)
      [address]       [place] [address]             [phone]                 9 Days Post-Op Procedure(s) (LRB): CORONARY ARTERY BYPASS GRAFTING (CABG) TIMES FOUR USING LEFT INTERNAL MAMMARY ARTERY, GREATER SAPHENOUS VEIN HARVESTED ENDOSCOPICALLY, AND LEFT RADIAL ARTERY HARVESTED OPEN (N/A) TRANSESOPHAGEAL ECHOCARDIOGRAM (TEE) (N/A) RADIAL ARTERY HARVEST (Left) ENDOVEIN HARVEST OF GREATER SAPHENOUS VEIN (Right) MAZE   Events: No events   _______________________________________________________________ Vitals: BP 120/66   Pulse 97   Temp 98.3 F (36.8 C) (Oral)   Resp (!) 22   Ht 6\' 2"  (1.88 m)   Wt 114.2 kg   SpO2 97%   BMI 32.32 kg/m  Filed Weights   07/15/21 0500 07/16/21 0432 07/17/21 0700  Weight: 119 kg 118.9 kg 114.2 kg     - Neuro: alert NAD  - Cardiovascular: sinus  Drips:       - Pulm:  Coarse, EWOB  ABG    Component Value Date/Time   PHART 7.459 (H) 07/16/2021 1330   PCO2ART 34.9 07/16/2021 1330   PO2ART 65 (L) 07/16/2021 1330   HCO3 24.8 07/16/2021 1330   TCO2 26 07/16/2021 1330   ACIDBASEDEF 9.0 (H) 07/10/2021 0303   O2SAT 67.9 07/17/2021 0514    - Abd: Soft - Extremity: warm  .Intake/Output      11/02 0701 11/03 0700 11/03 0701 11/04 0700   I.V. (mL/kg) 200.1 (1.8) 13.2 (0.1)   Blood 373    NG/GT 1736.3    IV Piggyback 95.2    Total Intake(mL/kg) 2404.6 (21.1) 13.2 (0.1)   Urine (mL/kg/hr) 40 (0)    Other 3948    Stool 1300    Chest Tube 0    Total Output 5288    Net -2883.4 +13.2        Stool Occurrence 1 x       _______________________________________________________________ Labs: CBC Latest Ref Rng & Units 07/17/2021 07/16/2021 07/16/2021  WBC 4.0 - 10.5 K/uL 15.2(H) - -  Hemoglobin 13.0 - 17.0 g/dL 8.4(L) 8.4(L) 8.2(L)  Hematocrit 39.0 - 52.0 % 26.3(L) 25.4(L) 24.0(L)  Platelets 150 - 400 K/uL 273 - -   CMP Latest Ref Rng & Units 07/17/2021 07/16/2021 07/16/2021  Glucose 70 - 99 mg/dL 156(H)  221(H) -  BUN 6 - 20 mg/dL 35(H) 31(H) -  Creatinine 0.61 - 1.24 mg/dL 3.71(H) 3.40(H) -  Sodium 135 - 145 mmol/L 138 136 137  Potassium 3.5 - 5.1 mmol/L 4.3 4.0 4.2  Chloride 98 - 111 mmol/L 105 103 -  CO2 22 - 32 mmol/L 24 24 -  Calcium 8.9 - 10.3 mg/dL 7.9(L) 7.8(L) -  Total Protein 6.5 - 8.1 g/dL - - -  Total Bilirubin 0.3 - 1.2 mg/dL - - -  Alkaline Phos 38 - 126 U/L - - -  AST 15 - 41 U/L - - -  ALT 0 - 44 U/L - - -    CXR: -  _______________________________________________________________  Assessment and Plan: POD 9 s/p emergency CABG  Neuro: pain controlled. CV:   On amio PO  Remains in sinus.  Wean dob as tolerated Pulm: aggressive pulm toilet.  Will remove CTs Renal:   Hopefully can transition to IHD GI: continue tubefeeds Heme: stable ID: afebrile.   Endo: SSI Dispo: continue ICU care.   Lajuana Matte 07/17/2021 7:42 AM

## 2021-07-17 NOTE — Progress Notes (Signed)
Patient ID: Billy [name], male   DOB: 21-Mar-1966, 55 y.o.   MRN: [id number] TCTS Evening Rounds:  Hemodynamically stable on no drips.  Had brief atrial fib today. Sinus otherwise.  Sats 98% on [place] 3L.  Remains even on CRRT.  BMET    Component Value Date/Time   NA 138 07/17/2021 1537   K 4.4 07/17/2021 1537   CL 103 07/17/2021 1537   CO2 26 07/17/2021 1537   GLUCOSE 241 (H) 07/17/2021 1537   BUN 37 (H) 07/17/2021 1537   BUN 31 02/26/2016 0000   CREATININE 3.66 (H) 07/17/2021 1537   CREATININE 1.02 09/09/2015 1710   CALCIUM 7.9 (L) 07/17/2021 1537   GFRNONAA 19 (L) 07/17/2021 1537

## 2021-07-17 NOTE — Plan of Care (Signed)

## 2021-07-17 NOTE — Progress Notes (Signed)
Physical Therapy Treatment Patient Details Name: Billy [name] MRN: [id number] DOB: [birth year] Today's Date: 07/17/2021   History of Present Illness Pt adm 10/24 with STEMI. Pt initially refused cardiac stenting but later changed his mind and underwnet LHC with PCI. Pt with severe 3V CAD and underwent CABG x 4  and MAZE on 10/25. Pt extubated 10/26. Reintubated 10/27. Extubated 11/1. CRRT initiated 10/27. PMH - visually impaired, afib, HTN, DM, neuropathy, anxiety, rt toe amputation    PT Comments    Pt tolerates treatment well but reports significant anxiety during session. Pt continues to require significant physical assistance to mobilize due to generalized weakness. R side does appear somewhat weaker than left, yet both sides are very weak compared to baseline. PT provides education on LE HEP to improve strength. Pt will continue to benefit from aggressive mobilization to improve activity tolerance and aide in a return to the pt's prior level of function. PT updates recommendations to CIR at this time as the pt is much more alert and better able to participate during sessions.   Recommendations for follow up therapy are one component of a multi-disciplinary discharge planning process, led by the attending physician.  Recommendations may be updated based on patient status, additional functional criteria and insurance authorization.  Follow Up Recommendations  Acute inpatient rehab (3hours/day)     Assistance Recommended at Discharge Intermittent Supervision/Assistance  Equipment Recommendations   (TBD)    Recommendations for Other Services Rehab consult     Precautions / Restrictions Precautions Precautions: Fall;Other (comment) Precaution Comments: visually impaired,CRRT Restrictions Weight Bearing Restrictions: No Other Position/Activity Restrictions: sternal precautions     Mobility  Bed Mobility Overal bed mobility: Needs Assistance Bed Mobility: Supine to Sit;Sit to  Supine     Supine to sit: Max assist;+2 for physical assistance;HOB elevated Sit to supine: Total assist;+2 for physical assistance        Transfers Overall transfer level: Needs assistance Equipment used: 2 person hand held assist Transfers: Sit to/from Stand Sit to Stand: Total assist;Max assist;+2 physical assistance           General transfer comment: totalA x2 for initiatl stand with R knee block, maxA x2 for 2nd stand with bilateral knee block, cues for trunk and hip extension    Ambulation/Gait                 Stairs             Wheelchair Mobility    Modified Rankin (Stroke Patients Only)       Balance Overall balance assessment: Needs assistance Sitting-balance support: Feet supported;No upper extremity supported Sitting balance-Leahy Scale: Poor Sitting balance - Comments: intermittent minA, R lateral lean Postural control: Right lateral lean Standing balance support: Bilateral upper extremity supported Standing balance-Leahy Scale: Zero Standing balance comment: max-totalA x 2                            Cognition Arousal/Alertness: Awake/alert Behavior During Therapy: Anxious Overall Cognitive Status: Impaired/Different from baseline Area of Impairment: Problem solving                             Problem Solving: Slow processing          Exercises      General Comments General comments (skin integrity, edema, etc.): VSS, pt on 3L [place]      Pertinent Vitals/Pain Pain Assessment: No/denies  pain    Home Living                          Prior Function            PT Goals (current goals can now be found in the care plan section) Acute Rehab PT Goals Patient Stated Goal: to improve mobility quality and get stronger Progress towards PT goals: Progressing toward goals    Frequency    Min 3X/week      PT Plan Current plan remains appropriate    Co-evaluation               AM-PAC PT "6 Clicks" Mobility   Outcome Measure  Help needed turning from your back to your side while in a flat bed without using bedrails?: Total Help needed moving from lying on your back to sitting on the side of a flat bed without using bedrails?: Total Help needed moving to and from a bed to a chair (including a wheelchair)?: Total Help needed standing up from a chair using your arms (e.g., wheelchair or bedside chair)?: Total Help needed to walk in hospital room?: Total Help needed climbing 3-5 steps with a railing? : Total 6 Click Score: 6    End of Session Equipment Utilized During Treatment: Oxygen Activity Tolerance: Patient limited by fatigue Patient left: in bed;with call bell/phone within reach;with nursing/sitter in room;with family/visitor present Nurse Communication: Mobility status PT Visit Diagnosis: Other abnormalities of gait and mobility (R26.89);Muscle weakness (generalized) (M62.81)     Time: 3086-5784 PT Time Calculation (min) (ACUTE ONLY): 29 min  Charges:  $Therapeutic Activity: 23-37 mins                     Zenaida Niece, PT, DPT Acute Rehabilitation Pager: [phone] Office [place] Josejulian Tarango 07/17/2021, 3:41 PM

## 2021-07-17 NOTE — Progress Notes (Signed)
Per report, patient expressed concerns about possibly not wanting chest compressions. This RN assessed patient for needs regarding this matter. Patient is alert and oriented x4 for assessment. This RN asked patient to express his concerns and wishes regarding code status. Patient states "I would like to get more information and have a conversation with my family and everybody about whether I should be brought back if anything happens to me while I am here." This RN educated patient that until this conversation is had and a decision is made he will remain a FULL CODE and the healthcare team will do everything to bring him back if his heart were to stop beating or he were to stop breathing for any reason, to include but not limited to medicines, chest compressions, and breathing tube. Patient verbalized understanding.

## 2021-07-17 NOTE — Progress Notes (Signed)
Patient ID: Billy [name], male   DOB: 06-05-1966, 55 y.o.   MRN: [id number]     Advanced Heart Failure Rounding Note  PCP-Cardiologist: None   Subjective:    10/25: S/P CABG x4 with MAZE . Intra Op EF 20%.  10/27: Intubated. Hypotensive. Placed on Norepi + Vaso. Recurrent A fib with amio increased to 60 mg/hr.  10/29: Co-ox low, DBA added 11/1: Extubated. AF with RVR, amio gtt restarted  Awake and follows commands, not talking.   Recurrent AF with RVR yesterday afternoon. Now in NSR on po amiodarone.   Off NE. On DBA 2.5 mcg/kg/min, Co-ox 68% this am.   Trach aspirate culture NGTD. Off cefepime.  CXR with left base atelectasis.  CVVH ongoing, pulling around 100 cc/hr net negative UF.  CVP 5 this morning with weight below pre-op.    Objective:   Weight Range: 114.2 kg Body mass index is 32.32 kg/m.   Vital Signs:   Temp:  [98.2 F (36.8 C)-100.2 F (37.9 C)] 98.3 F (36.8 C) (11/03 0400) Pulse Rate:  [78-143] 97 (11/03 0700) Resp:  [20-36] 22 (11/03 0700) BP: (76-161)/(53-83) 120/66 (11/03 0600) SpO2:  [93 %-100 %] 97 % (11/03 0700) Arterial Line BP: (87-198)/(38-68) 134/49 (11/03 0600) FiO2 (%):  [32 %] 32 % (11/02 1935) Weight:  [114.2 kg] 114.2 kg (11/03 0700) Last BM Date: 07/16/21  Weight change: Filed Weights   07/15/21 0500 07/16/21 0432 07/17/21 0700  Weight: 119 kg 118.9 kg 114.2 kg    Intake/Output:   Intake/Output Summary (Last 24 hours) at 07/17/2021 0752 Last data filed at 07/17/2021 0708 Gross per 24 hour  Intake 2417.79 ml  Output 5288 ml  Net -2870.21 ml      Physical Exam  CVP 5 General: NAD Neck: No JVD, no thyromegaly or thyroid nodule.  Lungs: Clear to auscultation bilaterally with normal respiratory effort. CV: Nondisplaced PMI.  Heart regular S1/S2, no S3/S4, no murmur.  No peripheral edema.   Abdomen: Soft, nontender, no hepatosplenomegaly, no distention.  Skin: Intact without lesions or rashes.  Neurologic: Awake, follows  commands Extremities: No clubbing or cyanosis.  HEENT: Normal.    Telemetry   NSR (personally reviewed)   Labs    CBC Recent Labs    07/16/21 0415 07/16/21 1330 07/16/21 1625 07/17/21 0514  WBC 12.3*  --   --  15.2*  NEUTROABS 10.6*  --   --   --   HGB 7.1*   < > 8.4* 8.4*  HCT 22.6*   < > 25.4* 26.3*  MCV 97.8  --   --  95.3  PLT 222  --   --  273   < > = values in this interval not displayed.   Basic Metabolic Panel Recent Labs    07/16/21 0415 07/16/21 1330 07/16/21 1625 07/17/21 0514  NA 133*   < > 136 138  K 4.0   < > 4.0 4.3  CL 101  --  103 105  CO2 24  --  24 24  GLUCOSE 237*  --  221* 156*  BUN 27*  --  31* 35*  CREATININE 3.36*  --  3.40* 3.71*  CALCIUM 7.6*  --  7.8* 7.9*  MG 2.8*  --   --  2.7*  PHOS 2.5  2.5  --  2.7 2.6   < > = values in this interval not displayed.   Liver Function Tests Recent Labs    07/16/21 1625 07/17/21 0514  ALBUMIN 2.0* 2.1*  No results for input(s): LIPASE, AMYLASE in the last 72 hours. Cardiac Enzymes No results for input(s): CKTOTAL, CKMB, CKMBINDEX, TROPONINI in the last 72 hours.  BNP: BNP (last 3 results) No results for input(s): BNP in the last 8760 hours.  ProBNP (last 3 results) No results for input(s): PROBNP in the last 8760 hours.   D-Dimer No results for input(s): DDIMER in the last 72 hours. Hemoglobin A1C No results for input(s): HGBA1C in the last 72 hours.  Fasting Lipid Panel No results for input(s): CHOL, HDL, LDLCALC, TRIG, CHOLHDL, LDLDIRECT in the last 72 hours.   Thyroid Function Tests No results for input(s): TSH, T4TOTAL, T3FREE, THYROIDAB in the last 72 hours.  Invalid input(s): FREET3  Other results:   Imaging    CT HEAD WO CONTRAST (5MM)  Result Date: 07/17/2021 CLINICAL DATA:  Stroke follow-up EXAM: CT HEAD WITHOUT CONTRAST TECHNIQUE: Contiguous axial images were obtained from the base of the skull through the vertex without intravenous contrast. COMPARISON:   None. FINDINGS: Brain: There is no mass, hemorrhage or extra-axial collection. The size and configuration of the ventricles and extra-axial CSF spaces are normal. The brain parenchyma is normal, without acute or chronic infarction. Vascular: No abnormal hyperdensity of the major intracranial arteries or dural venous sinuses. No intracranial atherosclerosis. Skull: The visualized skull base, calvarium and extracranial soft tissues are normal. Sinuses/Orbits: No fluid levels or advanced mucosal thickening of the visualized paranasal sinuses. No mastoid or middle ear effusion. The orbits are normal. IMPRESSION: Normal head CT. Electronically Signed   By: Ulyses Jarred M.D.   On: 07/17/2021 02:37     Medications:     Scheduled Medications:  amiodarone  200 mg Per Tube BID   Followed by   Derrill Memo ON 07/23/2021] amiodarone  200 mg Per Tube Daily   aspirin  81 mg Per Tube Daily   Or   aspirin EC  81 mg Oral Daily   atorvastatin  80 mg Per Tube Daily   B-complex with vitamin C  1 tablet Per Tube Daily   Chlorhexidine Gluconate Cloth  6 each Topical Daily   clonazePAM  0.25 mg Per Tube BID   clopidogrel  75 mg Per Tube Daily   feeding supplement (PROSource TF)  45 mL Per Tube QID   heparin injection (subcutaneous)  5,000 Units Subcutaneous Q8H   hydrALAZINE  12.5 mg Oral TID   insulin aspart  3-9 Units Subcutaneous Q4H   insulin aspart  6 Units Subcutaneous Q4H   insulin detemir  20 Units Subcutaneous BID   isosorbide dinitrate  10 mg Oral TID   mouth rinse  15 mL Mouth Rinse Q2H   multivitamin  15 mL Per Tube Daily   pantoprazole sodium  40 mg Per Tube Daily   QUEtiapine  25 mg Per Tube QHS   sodium chloride flush  10-40 mL Intracatheter Q12H   sodium chloride flush  3 mL Intravenous Q12H    Infusions:   prismasol BGK 4/2.5 400 mL/hr at 07/16/21 2151    prismasol BGK 4/2.5 300 mL/hr at 07/17/21 0544   sodium chloride Stopped (07/13/21 0234)   albumin human 12.5 g (07/08/21 1827)   And    sodium chloride Stopped (07/08/21 2051)   DOBUTamine 2.5 mcg/kg/min (07/17/21 0626)   feeding supplement (VITAL 1.5 CAL) 60 mL/hr at 07/17/21 0100   lactated ringers Stopped (07/16/21 0848)   norepinephrine (LEVOPHED) Adult infusion 0 mcg/min (07/17/21 0627)   prismasol BGK 4/2.5 1,500 mL/hr at 07/17/21  0543   promethazine (PHENERGAN) injection (IM or IVPB) Stopped (07/09/21 1628)    PRN Medications: acetaminophen, albumin human **AND** sodium chloride, albuterol, docusate, heparin, heparin, metoprolol tartrate, ondansetron (ZOFRAN) IV, oxyCODONE, polyethylene glycol, promethazine (PHENERGAN) injection (IM or IVPB), sennosides, simethicone, sodium chloride, sodium chloride flush, sodium chloride flush    Patient Profile   Mr Daniello is a 24 year with a history of visually impaired, DM, HTN, depression, anxiety takes 2 mg xanax 3 times a day, SVT, CKD Stage II, PAF, and CAD.Marland Kitchen   Anterior Stemi--> CABG--> cardiogenic shock.   Assessment/Plan   1. CAD: Admitted with anterolateral MI from occluded diagonal treated with DES.  Patient then had CABG with LIMA-LAD, SVG-D, SVG-PDA, and left radial-OM for complete revascularization.    - Continue ASA 81. Will likely drop this at discharge given need for Plavix and anticoagulation.  - Continue Plavix 75 daily.  - atorvastatin 80 mg daily.  - Amlodipine for radial harvest held with hypotension on pressors.    2. Atrial fibrillation: Paroxysmal, s/p Maze and appendage clipping.  Having ongoing transient runs of atrial fibrillation.  - Continue amiodarone 200 mg bid.  - On heparin [place], ideally to apixaban eventually.  Currently not anticoagulated with ongoing need for blood products (transfused yesterday).  In patients with surgical LA appendage closure, if unable to take anticoagulation long-term should get DOAC 3 months then assess for completeness of closure with TEE.  If able to take anticoagulation, the LAA closure is adjunctive.  3. AKI on CKD  stage 3: Baseline creatinine around 1.6, up to 5 post-op.  He has likely had multiple hits including contrast with initial cath/PCI and possible peri-operative hypotension.  Suspect ATN is present, will need to support him for now and hopefully function will eventually improve. He is on CVVH. CVP 5 with weight below baseline. Minimal UOP, not much renal recovery at this point.   - Back off on UF, aim for even to net negative 50 cc/hr UF.  4. Acute systolic CHF: Ischemic cardiomyopathy.  Echo prior to this admission with EF 50-55% (pre-MI and CABG).  Limited echo on 10/28 showed minimal pericardial effusion, EF in 25% range. Intraoperative TEE with EF 20%.  Co-ox 68% on DBA 2.5. Off vasopressin + NE.   CVP 5 today, weight below baseline.   - Will stop dobutamine today.  - Elevated BP, start hydralazine 12.5 mg tid + isordil 10 tid.  - Drop back on UF to even to net negative 50 cc/hr.  5. Anemia: Transfuse hgb < 8, had 1 unit yesterday.  6. ID: Afebrile, off abx.  - Course of cefepime completed 7. Acute hypoxemic respiratory failure: Suspect primarily pulmonary edema, now intubated.  Possible PNA with bibasilar lung opacities. Extubated, completed course of cefepime, volume status now looks good.  8. Thrombocytopenia: Resolved.   CRITICAL CARE Performed by: Loralie Champagne  Total critical care time: 35 minutes  Critical care time was exclusive of separately billable procedures and treating other patients.  Critical care was necessary to treat or prevent imminent or life-threatening deterioration.  Critical care was time spent personally by me on the following activities: development of treatment plan with patient and/or surrogate as well as nursing, discussions with consultants, evaluation of patient's response to treatment, examination of patient, obtaining history from patient or surrogate, ordering and performing treatments and interventions, ordering and review of laboratory studies, ordering  and review of radiographic studies, pulse oximetry and re-evaluation of patient's condition.  Loralie Champagne 07/17/2021 7:52  AM

## 2021-07-18 ENCOUNTER — Inpatient Hospital Stay (HOSPITAL_COMMUNITY): Payer: Medicare HMO

## 2021-07-18 DIAGNOSIS — I428 Other cardiomyopathies: Secondary | ICD-10-CM

## 2021-07-18 DIAGNOSIS — N179 Acute kidney failure, unspecified: Secondary | ICD-10-CM | POA: Diagnosis not present

## 2021-07-18 DIAGNOSIS — Z951 Presence of aortocoronary bypass graft: Secondary | ICD-10-CM | POA: Diagnosis not present

## 2021-07-18 DIAGNOSIS — L8990 Pressure ulcer of unspecified site, unspecified stage: Secondary | ICD-10-CM | POA: Insufficient documentation

## 2021-07-18 LAB — RENAL FUNCTION PANEL
Albumin: 2.1 g/dL — ABNORMAL LOW (ref 3.5–5.0)
Albumin: 2.2 g/dL — ABNORMAL LOW (ref 3.5–5.0)
Anion gap: 6 (ref 5–15)
Anion gap: 6 (ref 5–15)
BUN: 39 mg/dL — ABNORMAL HIGH (ref 6–20)
BUN: 37 mg/dL — ABNORMAL HIGH (ref 6–20)
CO2: 27 mmol/L (ref 22–32)
CO2: 27 mmol/L (ref 22–32)
Calcium: 7.8 mg/dL — ABNORMAL LOW (ref 8.9–10.3)
Calcium: 7.8 mg/dL — ABNORMAL LOW (ref 8.9–10.3)
Chloride: 105 mmol/L (ref 98–111)
Chloride: 103 mmol/L (ref 98–111)
Creatinine, Ser: 3.76 mg/dL — ABNORMAL HIGH (ref 0.61–1.24)
Creatinine, Ser: 3.61 mg/dL — ABNORMAL HIGH (ref 0.61–1.24)
GFR, Estimated: 18 mL/min — ABNORMAL LOW (ref >60)
GFR, Estimated: 19 mL/min — ABNORMAL LOW (ref >60)
Glucose, Bld: 167 mg/dL — ABNORMAL HIGH (ref 70–99)
Glucose, Bld: 135 mg/dL — ABNORMAL HIGH (ref 70–99)
Phosphorus: 3.1 mg/dL (ref 2.5–4.6)
Phosphorus: 2.9 mg/dL (ref 2.5–4.6)
Potassium: 4.5 mmol/L (ref 3.5–5.1)
Potassium: 4.3 mmol/L (ref 3.5–5.1)
Sodium: 138 mmol/L (ref 135–145)
Sodium: 136 mmol/L (ref 135–145)

## 2021-07-18 LAB — CBC
HCT: 27 % — ABNORMAL LOW (ref 39.0–52.0)
Hemoglobin: 8.7 g/dL — ABNORMAL LOW (ref 13.0–17.0)
MCH: 31.1 pg (ref 26.0–34.0)
MCHC: 32.2 g/dL (ref 30.0–36.0)
MCV: 96.4 fL (ref 80.0–100.0)
Platelets: 267 10*3/uL (ref 150–400)
RBC: 2.8 MIL/uL — ABNORMAL LOW (ref 4.22–5.81)
RDW: 13.5 % (ref 11.5–15.5)
WBC: 13.8 10*3/uL — ABNORMAL HIGH (ref 4.0–10.5)
nRBC: 0 % (ref 0.0–0.2)

## 2021-07-18 LAB — ECHOCARDIOGRAM LIMITED
Height: 74 in
S' Lateral: 3.5 cm
Single Plane A4C EF: 54.3 %
Weight: 3735.47 oz

## 2021-07-18 LAB — GLUCOSE, CAPILLARY
Glucose-Capillary: 100 mg/dL — ABNORMAL HIGH (ref 70–99)
Glucose-Capillary: 103 mg/dL — ABNORMAL HIGH (ref 70–99)
Glucose-Capillary: 115 mg/dL — ABNORMAL HIGH (ref 70–99)
Glucose-Capillary: 138 mg/dL — ABNORMAL HIGH (ref 70–99)
Glucose-Capillary: 155 mg/dL — ABNORMAL HIGH (ref 70–99)
Glucose-Capillary: 168 mg/dL — ABNORMAL HIGH (ref 70–99)
Glucose-Capillary: 231 mg/dL — ABNORMAL HIGH (ref 70–99)
Glucose-Capillary: 243 mg/dL — ABNORMAL HIGH (ref 70–99)

## 2021-07-18 LAB — COOXEMETRY PANEL
Carboxyhemoglobin: 1.6 % — ABNORMAL HIGH (ref 0.5–1.5)
Methemoglobin: 0.9 % (ref 0.0–1.5)
O2 Saturation: 65.5 %
Total hemoglobin: 8.3 g/dL — ABNORMAL LOW (ref 12.0–16.0)

## 2021-07-18 LAB — MAGNESIUM: Magnesium: 2.8 mg/dL — ABNORMAL HIGH (ref 1.7–2.4)

## 2021-07-18 MED ORDER — VITAL 1.5 CAL PO LIQD
1000.0000 mL | ORAL | Status: DC
  Administered 2021-07-18: 1000 mL

## 2021-07-18 MED ORDER — METOCLOPRAMIDE HCL 5 MG/ML IJ SOLN
10.0000 mg | Freq: Four times a day (QID) | INTRAMUSCULAR | Status: AC
  Administered 2021-07-18 – 2021-07-20 (×7): 10 mg via INTRAVENOUS
  Filled 2021-07-18 (×7): qty 2

## 2021-07-18 MED ORDER — CARVEDILOL 3.125 MG PO TABS
3.1250 mg | ORAL_TABLET | Freq: Two times a day (BID) | ORAL | Status: DC
  Administered 2021-07-18 – 2021-07-22 (×8): 3.125 mg via ORAL
  Filled 2021-07-18 (×8): qty 1

## 2021-07-18 NOTE — Progress Notes (Signed)
Occupational Therapy Treatment Patient Details Name: Billy [name] MRN: [id number] DOB: 29-Oct-1965 Today's Date: 07/18/2021   History of present illness Pt adm 10/24 with STEMI. Pt initially refused cardiac stenting but later changed his mind and underwnet LHC with PCI. Pt with severe 3V CAD and underwent CABG x 4  and MAZE on 10/25. Pt extubated 10/26. Reintubated 10/27. Extubated 11/1. CRRT initiated 10/27. PMH - visually impaired, afib, HTN, DM, neuropathy, anxiety, rt toe amputation   OT comments  Pt texting church members in bed upon arrival, states it is difficult as he fatigues easily and UEs feel heavy. 2 person assist needed to assist pt to EOB. BP 114/47 (87) supine, 71/52 (60) in sitting, 99/64 (75) with pt reporting increase in weakness with sitting EOB. Pt demonstrated fair sitting balance x 10 minutes. Per RN, pt is now able to self feed with set up.    Recommendations for follow up therapy are one component of a multi-disciplinary discharge planning process, led by the attending physician.  Recommendations may be updated based on patient status, additional functional criteria and insurance authorization.    Follow Up Recommendations  Skilled nursing-short term rehab (<3 hours/day)    Assistance Recommended at Discharge Frequent or [place] [facility] bed;Wheelchair (measurements OT);Wheelchair cushion (measurements OT);BSC;Other (comment)    Recommendations for Other Services      Precautions / Restrictions Precautions Precautions: Fall;Other (comment) Precaution Comments: visually impaired,CRRT Restrictions Other Position/Activity Restrictions: sternal precautions       Mobility Bed Mobility Overal bed mobility: Needs Assistance Bed Mobility: Supine to Sit;Sit to Supine     Supine to sit: Max assist;+2 for physical assistance;HOB elevated Sit to supine: Total assist;+2 for physical assistance   General bed  mobility comments: multimodal cues to initiate LEs toward EOB, assist to raise trunk and position hips at EOB, assist for all aspect to return to supine and pull up in bed    Transfers                   General transfer comment: deferred due to low BP     Balance Overall balance assessment: Needs assistance   Sitting balance-Leahy Scale: Fair Sitting balance - Comments: min guard assist                                   ADL either performed or assessed with clinical judgement   ADL Overall ADL's : Needs assistance/impaired Eating/Feeding: Minimal assistance;Bed level                   Lower Body Dressing: Bed level;Total assistance                 General ADL Comments: cortrak remains, pt self feeding with set up     Vision       Perception     Praxis      Cognition Arousal/Alertness: Awake/alert Behavior During Therapy: Anxious Overall Cognitive Status: Impaired/Different from baseline Area of Impairment: Following commands;Memory;Problem solving                     Memory: Decreased short-term memory;Decreased recall of precautions Following Commands: Follows one step commands with increased time     Problem Solving: Slow processing;Decreased initiation;Difficulty sequencing;Requires verbal cues;Requires tactile cues            Exercises     Shoulder Instructions  General Comments      Pertinent Vitals/ Pain       Pain Assessment: Faces Faces Pain Scale: Hurts little more Pain Location: generalized, chest incision Pain Descriptors / Indicators: Sore;Guarding;Grimacing Pain Intervention(s): Monitored during session;Repositioned  Home Living                                          Prior Functioning/Environment              Frequency  Min 2X/week        Progress Toward Goals  OT Goals(current goals can now be found in the care plan section)  Progress towards OT  goals: Progressing toward goals  Acute Rehab OT Goals OT Goal Formulation: With family Time For Goal Achievement: 07/30/21 Potential to Achieve Goals: [place] Discharge plan remains appropriate    Co-evaluation                 AM-PAC OT "6 Clicks" Daily Activity     Outcome Measure   Help from another person eating meals?: A Little Help from another person taking care of personal grooming?: A Lot Help from another person toileting, which includes using toliet, bedpan, or urinal?: Total Help from another person bathing (including washing, rinsing, drying)?: Total Help from another person to put on and taking off regular upper body clothing?: Total   6 Click Score: 8    End of Session    OT Visit Diagnosis: Muscle weakness (generalized) (M62.81);Other symptoms and signs involving cognitive function;Pain   Activity Tolerance Treatment limited secondary to medical complications (Comment) (Bp drops in sitting)   Patient Left in bed;with call bell/phone within reach;with nursing/sitter in room   Nurse Communication Mobility status (aware of BP)        Time: 2620-3559 OT Time Calculation (min): 30 min  Charges: OT General Charges $OT Visit: 1 Visit OT Treatments $Therapeutic Activity: 23-37 mins  [name], OTR/L Acute Rehabilitation Services Pager: [phone] Office: [phone]   [name] 07/18/2021, 12:46 PM

## 2021-07-18 NOTE — Progress Notes (Signed)
[place] Progress Note Patient Name: Billy [name] DOB: 03/17/1966 MRN: [id number]   Date of Service  07/18/2021  HPI/Events of Note  Hypoxia - Review of CXR reveals elevated left hemidiaphragm with left lung base atelectasis.  eICU Interventions  Plan: Incentive spirometry Q 1 hours while awake.      Intervention Category Major Interventions: Hypoxemia - evaluation and management  [name] 07/18/2021, 9:21 PM

## 2021-07-18 NOTE — Progress Notes (Signed)
[place] Progress Note Patient Name: MONTA POLICE DOB: [birth year] MRN: [id number]   Date of Service  07/18/2021  HPI/Events of Note  Patient has been taking PO today despite having cortrak, extubated 11/1.  Pt now sat 88-91% on 3-4L [place], tachypneic 35, audible rhonchi per RN. Sat now = 98% and RR = 35.  eICU Interventions  Plan: Nothing further by mouth for tonight. Portable CXR STAT.     Intervention Category Major Interventions: Hypoxemia - evaluation and management  Shanyiah Conde [place] 07/18/2021, 8:20 PM

## 2021-07-18 NOTE — Progress Notes (Signed)
  Echocardiogram 2D Echocardiogram has been performed.  Billy [name] 07/18/2021, 11:52 AM

## 2021-07-18 NOTE — Progress Notes (Signed)
Physical Therapy Treatment Patient Details Name: Billy [name] MRN: [id number] DOB: 10/13/1965 Today's Date: 07/18/2021   History of Present Illness Pt adm 10/24 with STEMI. Pt initially refused cardiac stenting but later changed his mind and underwnet LHC with PCI. Pt with severe 3V CAD and underwent CABG x 4  and MAZE on 10/25. Pt extubated 10/26. Reintubated 10/27. Extubated 11/1. CRRT initiated 10/27. PMH - visually impaired, afib, HTN, DM, neuropathy, anxiety, rt toe amputation    PT Comments    Pt making steady progress. Focused on LE exercise program. Sister present and watched exercises. Created and printed exercise program for pt's sisters to assist with.    Recommendations for follow up therapy are one component of a multi-disciplinary discharge planning process, led by the attending physician.  Recommendations may be updated based on patient status, additional functional criteria and insurance authorization.  Follow Up Recommendations  Skilled nursing-short term rehab (<3 hours/day)     Assistance Recommended at Discharge Frequent or constant Supervision/Assistance  Equipment Recommendations  Other (comment) (TBD)    Recommendations for Other Services       Precautions / Restrictions Precautions Precautions: Fall;Other (comment);Sternal Precaution Comments: visually impaired,CRRT Restrictions Other Position/Activity Restrictions: sternal precautions     Mobility  Bed Mobility Overal bed mobility: Needs Assistance Bed Mobility: Sit to Supine      Sit to supine: Total assist;+2 for physical assistance   General bed mobility comments: Assist to lower trunk and bring legs up into bed    Transfers                   General transfer comment: deferred due to low BP    Ambulation/Gait                 Stairs             Wheelchair Mobility    Modified Rankin (Stroke Patients Only)       Balance Overall balance assessment: Needs  assistance Sitting-balance support: Feet supported;No upper extremity supported Sitting balance-Leahy Scale: Fair Sitting balance - Comments: min guard assist                                    Cognition Arousal/Alertness: Awake/alert Behavior During Therapy: Anxious Overall Cognitive Status: Impaired/Different from baseline Area of Impairment: Following commands;Memory;Problem solving                     Memory: Decreased short-term memory;Decreased recall of precautions Following Commands: Follows one step commands with increased time     Problem Solving: Slow processing;Decreased initiation;Difficulty sequencing;Requires verbal cues;Requires tactile cues          Exercises General Exercises - Lower Extremity Ankle Circles/Pumps: AROM;10 reps;Both;Supine Short Arc Quad: AROM;Both;10 reps;Supine Long Arc Quad: AROM;10 reps;Both;Seated Heel Slides: AAROM;Both;10 reps;Supine Hip ABduction/ADduction: AAROM;Both;10 reps;Supine Straight Leg Raises: AAROM;Both;10 reps;Supine    General Comments General comments (skin integrity, edema, etc.): VSS      Pertinent Vitals/Pain Pain Assessment: Faces Faces Pain Scale: Hurts little more Pain Location: generalized, chest incision Pain Descriptors / Indicators: Sore;Guarding;Grimacing Pain Intervention(s): Monitored during session;Repositioned    Home Living                          Prior Function            PT Goals (current goals can now be found  in the care plan section) Progress towards PT goals: Progressing toward goals    Frequency    Min 3X/week      PT Plan Discharge plan needs to be updated    Co-evaluation              AM-PAC PT "6 Clicks" Mobility   Outcome Measure  Help needed turning from your back to your side while in a flat bed without using bedrails?: Total Help needed moving from lying on your back to sitting on the side of a flat bed without using  bedrails?: Total Help needed moving to and from a bed to a chair (including a wheelchair)?: Total Help needed standing up from a chair using your arms (e.g., wheelchair or bedside chair)?: Total Help needed to walk in hospital room?: Total Help needed climbing 3-5 steps with a railing? : Total 6 Click Score: 6    End of Session Equipment Utilized During Treatment: Oxygen Activity Tolerance: Patient tolerated treatment well Patient left: in bed;with call bell/phone within reach;with family/visitor present Nurse Communication: Mobility status (nurse present) PT Visit Diagnosis: Other abnormalities of gait and mobility (R26.89);Muscle weakness (generalized) (M62.81)     Time: 4503-8882 PT Time Calculation (min) (ACUTE ONLY): 22 min  Charges:  $Therapeutic Activity: 8-22 mins                     [place] Pager [phone] Office [place] 07/18/2021, 3:07 PM

## 2021-07-18 NOTE — Progress Notes (Signed)
Upon initial assessment pt, is tachypenic RR 30-40, and lung sounds are rhonchus. Pt is alert and oriented and RN was able to get the patient to cough up mucus. RN is concerned patient may have aspirated. RN called Elink RN in regards of my concerns.

## 2021-07-18 NOTE — Progress Notes (Signed)
CPT held at this time per patient request.

## 2021-07-18 NOTE — Progress Notes (Signed)
   NAME:  Billy [name], MRN:  [id number], DOB:  [birth year], LOS: 51 ADMISSION DATE:  07/07/2021, CONSULTATION DATE:  07/18/2021  REFERRING MD:  Lynnette Caffey, CHIEF COMPLAINT:  cardiogenic shock , on bipap   History of Present Illness:  55 year old diabetic, hypertensive admitted 10/24 with STEMI.  He had recent admission 10/11 for chest pain and hypertensive urgency with normal LVEF on echo.  He underwent left heart cath which showed three-vessel disease, underwent PCI with DES to left circumflex followed by CABG x4 with radial harvest and maze procedure.  Intra-Op EF was noted to be 20%.  He was extubated postop day 1 but developed progressive shock requiring Levophed and vasopressin with AKI and oliguria.  He was placed on BiPAP for hypoxia, ABG showed metabolic acidosis for which she was given 3 amps of bicarb and PCCM consulted Seen by heart failure service, coox 56% started on milrinone  Pertinent  Medical History  Diabetes Hypertension CKD stage II -Baseline creatinine 1.8 Paroxysmal atrial fibrillation Anxiety -on Xanax 2 mg thrice daily  Significant Hospital Events:   10/24 admitted with STEMI  10/27 PCCM consulted, due to respiratory issues intubated early morning   10/28 No acute events overnight, flipped back into NSR with less pressor requirement and decreased FIO2 this AM 10/30 increasing FiO2 again as fluid removal decreased.  Copious secretions. 11/1 extubated 11/2 off dobutamine.   Interim History / Subjective:  In better spirits today. CRRT ongoing but to stop tonight.  Sat on edge of bed Starting to eat but not much appetite.  Objective   Blood pressure 95/67, pulse 80, temperature 99 F (37.2 C), temperature source Oral, resp. rate (!) 26, height 6\' 2"  (1.88 m), weight 105.9 kg, SpO2 100 %.        Intake/Output Summary (Last 24 hours) at 07/18/2021 1430 Last data filed at 07/18/2021 1400 Gross per 24 hour  Intake 1768 ml  Output 1781 ml  Net -13 ml     Filed Weights   07/17/21 0700 07/18/21 0630 07/18/21 0753  Weight: 114.2 kg 106.9 kg 105.9 kg    Examination: No distress Lungs clear Mild anasarca Sternotomy scar well-healed Ext warm Aox3, fair insight Flexiseal in place   Ancillary tests  SvO2 65% Repeat echo noted Still fair amount of gas distension on abd  Assessment & Plan:   Post 3 vessel CABG complicated by shock, renal failure, neuromuscular weakness, ileus- overall improving, off inotropes.  Starting to take PO but also on full TF.  - Drop TF in half - Restart reglan - Progressive mobility - CRRT to stop tonight, hopefully have some renal recovery - Encourage IS - Basal bolus insulin as ordered - Seems less anxious today - Will follow  Erskine Emery MD PCCM

## 2021-07-18 NOTE — TOC Progression Note (Signed)
Transition of Care [facility]) - Progression Note    Patient Details  Name: Billy [name] MRN: [id number] Date of Birth: 23-Jul-1966  Transition of Care [facility]) CM/SW [place], Donalds Phone Number: 07/18/2021, 3:36 PM  Clinical Narrative:    HF CSW filled out FL2 it is ready for MD signature once feeding tube is removed. RN reports they are waiting for him to eat a majority of his PO meals to discharge feeding tube. Which will need to occur before FL2 can be faxed out for SNF bed offers.  CSW will continue to follow throughout discharge.       Expected Discharge Plan and Services                                                 Social Determinants of Health (SDOH) Interventions    Readmission Risk Interventions No flowsheet data found.  Cindi Ghazarian, MSW, [place] Heart Failure Social Worker

## 2021-07-18 NOTE — Progress Notes (Addendum)
Patient ID: Billy [name], male   DOB: 1966/05/03, 55 y.o.   MRN: [id number]     Advanced Heart Failure Rounding Note  PCP-Cardiologist: None   Subjective:    10/25: S/P CABG x4 with MAZE . Intra Op EF 20%.  10/27: Intubated. Hypotensive. Placed on Norepi + Vaso. Recurrent A fib with amio increased to 60 mg/hr.  10/29: Co-ox low, DBA added 11/1: Extubated. AF with RVR, amio gtt restarted 11/03: Dobutamine off  Off NE and DBA, Co-ox 66% this am.   One short run of AF yesterday afternoon, maintaining sinus this am with po amio.  Not making urine. Remains on CRRT, pulling for net even. Weight this morning probably not accurate.  Hgb stable at 8.7  Trach aspirate culture NGTD. Off cefepime.  CXR with left base atelectasis.  Clear liquid diet currently + tube feeds.   Objective:   Weight Range: 106.9 kg Body mass index is 30.26 kg/m.   Vital Signs:   Temp:  [98 F (36.7 C)-98.7 F (37.1 C)] 98.3 F (36.8 C) (11/04 0300) Pulse Rate:  [81-167] 86 (11/04 0700) Resp:  [25-39] 31 (11/04 0700) BP: (90-151)/(57-77) 137/73 (11/04 0700) SpO2:  [81 %-100 %] 99 % (11/04 0700) Arterial Line BP: (143-199)/(58-71) 199/71 (11/03 1000) Weight:  [106.9 kg] 106.9 kg (11/04 0630) Last BM Date: 07/17/21  Weight change: Filed Weights   07/16/21 0432 07/17/21 0700 07/18/21 0630  Weight: 118.9 kg 114.2 kg 106.9 kg    Intake/Output:   Intake/Output Summary (Last 24 hours) at 07/18/2021 0729 Last data filed at 07/18/2021 0700 Gross per 24 hour  Intake 1633.38 ml  Output 2020 ml  Net -386.62 ml      Physical Exam   General: NAD Neck: + CorTrack, LIJ HD cath Lungs: Clear to auscultation bilaterally with normal respiratory effort. CV: Nondisplaced PMI.  Heart regular S1/S2, no S3/S4, no murmur.   Abdomen: Soft, nontender, no hepatosplenomegaly, no distention.  Skin: Intact without lesions or rashes.  Neurologic: Awake, follows commands Extremities: No clubbing or cyanosis. Trace  edema HEENT: Normal.    Telemetry   NSR 80s (personally reviewed)   Labs    CBC Recent Labs    07/16/21 0415 07/16/21 1330 07/17/21 0514 07/18/21 0151  WBC 12.3*  --  15.2* 13.8*  NEUTROABS 10.6*  --   --   --   HGB 7.1*   < > 8.4* 8.7*  HCT 22.6*   < > 26.3* 27.0*  MCV 97.8  --  95.3 96.4  PLT 222  --  273 267   < > = values in this interval not displayed.   Basic Metabolic Panel Recent Labs    07/17/21 0514 07/17/21 1537 07/18/21 0151  NA 138 138 138  K 4.3 4.4 4.5  CL 105 103 105  CO2 24 26 27   GLUCOSE 156* 241* 167*  BUN 35* 37* 39*  CREATININE 3.71* 3.66* 3.76*  CALCIUM 7.9* 7.9* 7.8*  MG 2.7*  --  2.8*  PHOS 2.6 2.6 3.1   Liver Function Tests Recent Labs    07/17/21 1537 07/18/21 0151  ALBUMIN 2.0* 2.1*   No results for input(s): LIPASE, AMYLASE in the last 72 hours. Cardiac Enzymes No results for input(s): CKTOTAL, CKMB, CKMBINDEX, TROPONINI in the last 72 hours.  BNP: BNP (last 3 results) No results for input(s): BNP in the last 8760 hours.  ProBNP (last 3 results) No results for input(s): PROBNP in the last 8760 hours.   D-Dimer No results  for input(s): DDIMER in the last 72 hours. Hemoglobin A1C No results for input(s): HGBA1C in the last 72 hours.  Fasting Lipid Panel No results for input(s): CHOL, HDL, LDLCALC, TRIG, CHOLHDL, LDLDIRECT in the last 72 hours.   Thyroid Function Tests No results for input(s): TSH, T4TOTAL, T3FREE, THYROIDAB in the last 72 hours.  Invalid input(s): FREET3  Other results:   Imaging    DG Abd 1 View  Result Date: 07/17/2021 CLINICAL DATA:  Ileus EXAM: ABDOMEN - 1 VIEW COMPARISON:  None. FINDINGS: Enteric tube appears to course past the pylorus with tip terminating in the distal third portion of the duodenum. Prominent loop of large bowel in the left lower quadrant measures near the upper limit of normal. No dilated loops of small bowel identified. No pathologic calcifications. No acute osseous  abnormality. IMPRESSION: Enteric tube appears to terminate in the third portion of the duodenum. No significantly dilated loops of small bowel identified. Prominent air-filled loop of large bowel with left lower quadrant measures at the upper limits normal. Electronically Signed   By: Albin Felling M.D.   On: 07/17/2021 14:43     Medications:     Scheduled Medications:  ALPRAZolam  1 mg Oral TID   amiodarone  200 mg Per Tube BID   Followed by   Derrill Memo ON 07/23/2021] amiodarone  200 mg Per Tube Daily   aspirin  81 mg Per Tube Daily   Or   aspirin EC  81 mg Oral Daily   atorvastatin  80 mg Per Tube Daily   B-complex with vitamin C  1 tablet Per Tube Daily   chlorhexidine  15 mL Mouth Rinse BID   Chlorhexidine Gluconate Cloth  6 each Topical Daily   clopidogrel  75 mg Per Tube Daily   feeding supplement (PROSource TF)  45 mL Per Tube QID   heparin injection (subcutaneous)  5,000 Units Subcutaneous Q8H   hydrALAZINE  12.5 mg Oral TID   insulin aspart  3-9 Units Subcutaneous Q4H   insulin aspart  6 Units Subcutaneous Q4H   insulin detemir  20 Units Subcutaneous BID   isosorbide dinitrate  10 mg Oral TID   mouth rinse  15 mL Mouth Rinse q12n4p   multivitamin with minerals  1 tablet Oral Daily   pantoprazole sodium  40 mg Per Tube Daily   sertraline  50 mg Oral Daily   sodium chloride flush  3 mL Intravenous Q12H   traZODone  100 mg Per Tube QHS    Infusions:   prismasol BGK 4/2.5 400 mL/hr at 07/17/21 2015    prismasol BGK 4/2.5 300 mL/hr at 07/18/21 0102   sodium chloride Stopped (07/13/21 0234)   albumin human 12.5 g (07/08/21 1827)   And   sodium chloride Stopped (07/08/21 2051)   feeding supplement (VITAL 1.5 CAL) 60 mL/hr at 07/18/21 0700   heparin 10,000 units/ 20 mL infusion syringe 500 Units/hr (07/17/21 1938)   lactated ringers Stopped (07/16/21 0848)   prismasol BGK 4/2.5 1,500 mL/hr at 07/18/21 0640   promethazine (PHENERGAN) injection (IM or IVPB) Stopped (07/09/21  1628)    PRN Medications: acetaminophen, albumin human **AND** sodium chloride, albuterol, docusate, heparin, heparin, heparin, metoprolol tartrate, ondansetron (ZOFRAN) IV, oxyCODONE, polyethylene glycol, promethazine (PHENERGAN) injection (IM or IVPB), sennosides, simethicone, sodium chloride flush    Patient Profile   Billy [name] is a 8 year with a history of visually impaired, DM, HTN, depression, anxiety takes 2 mg xanax 3 times a day, SVT,  CKD Stage II, PAF, and CAD.Marland Kitchen   Anterior Stemi--> CABG--> cardiogenic shock.   Assessment/Plan   1. CAD: Admitted with anterolateral MI from occluded diagonal treated with DES.  Patient then had CABG with LIMA-LAD, SVG-D, SVG-PDA, and left radial-OM for complete revascularization.    - Continue ASA 81. Will likely drop this at discharge given need for Plavix and anticoagulation.  - Continue Plavix 75 daily.  - atorvastatin 80 mg daily.  - Amlodipine for radial harvest held with hypotension on pressors.    2. Atrial fibrillation: Paroxysmal, s/p Maze and appendage clipping.  Having ongoing transient runs of atrial fibrillation.  - Continue amiodarone 200 mg bid.  - Heparin running through CRRT circuit + subQ heparin, ideally to apixaban eventually.  Hgb stable at 8.7 today, transfused 11/02. Currently not anticoagulated with ongoing need for blood products (transfused yesterday).  In patients with surgical LA appendage closure, if unable to take anticoagulation long-term should get DOAC 3 months then assess for completeness of closure with TEE.  If able to take anticoagulation, the LAA closure is adjunctive.  3. AKI on CKD stage 3: Baseline creatinine around 1.6, up to 5 post-op.  He has likely had multiple hits including contrast with initial cath/PCI and possible peri-operative hypotension.  Suspect ATN is present. Not making any urine. Now pulling for net even. Concerned about chances of renal recovery. Nephrology following. 4. Acute systolic CHF:  Ischemic cardiomyopathy.  Echo prior to this admission with EF 50-55% (pre-MI and CABG).  Limited echo on 10/28 showed minimal pericardial effusion, EF in 25% range. Intraoperative TEE with EF 20%. Off vasopressin + NE + Off DBA . Co-ox stable off inotrope - Continue hydralazine 12.5 mg tid + isordil 10 tid.  - Start coreg 3.125 mg BID - Volume stable. Continues on CVVH, pulling for net even. Not making urine at this point. 5. Anemia: Transfuse hgb < 8, had 1 unit 11/02 6. ID: Afebrile, off abx.  - Course of cefepime completed 7. Acute hypoxemic respiratory failure: Suspect primarily pulmonary edema, now intubated.  Possible PNA with bibasilar lung opacities. Extubated, completed course of cefepime, volume status now looks good. 02 stable on 3L [place] 8. Thrombocytopenia: Resolved.  9. Right sided weakness: Normal head CT 11/03  [facility], Billy [name] 07/18/2021 7:29 AM  Patient seen with NP, agree with the above note.   Clear mentally this morning, fully oriented.  Discussion about diet, he is frustrated as he tries to follow a vegan diet.   MAP stable, co-ox 66%.  Remains off dobutamine.  CVVH running even, not making urine.  No CVP line.   NSR this morning on po amiodarone.   General: NAD Neck: No JVD, no thyromegaly or thyroid nodule.  Lungs: Clear to auscultation bilaterally with normal respiratory effort. CV: Nondisplaced PMI.  Heart regular S1/S2, no S3/S4, no murmur.  No peripheral edema.   Abdomen: Soft, nontender, no hepatosplenomegaly, no distention.  Skin: Intact without lesions or rashes.  Neurologic: Alert and oriented x 3.  Psych: Normal affect. Extremities: No clubbing or cyanosis.  HEENT: Normal.   Volume status looks ok by exam.  Think he is ready for transition to iHD.  Will need permanent HD catheter as it looks like he will need ongoing HD.   Continue hydralazine/Imdur and will add Coreg 3.125 mg bid. Good co-ox at 66%.   Continue amiodarone po for atrial fibrillation.   Eventually start Eliquis and stop ASA (continue Plavix).  Hgb appears to be stabilizing.  CRITICAL CARE Performed by: Loralie Champagne  Total critical care time: 35 minutes  Critical care time was exclusive of separately billable procedures and treating other patients.  Critical care was necessary to treat or prevent imminent or life-threatening deterioration.  Critical care was time spent personally by me on the following activities: development of treatment plan with patient and/or surrogate as well as nursing, discussions with consultants, evaluation of patient's response to treatment, examination of patient, obtaining history from patient or surrogate, ordering and performing treatments and interventions, ordering and review of laboratory studies, ordering and review of radiographic studies, pulse oximetry and re-evaluation of patient's condition.  Loralie Champagne 07/18/2021 8:06 AM

## 2021-07-18 NOTE — Progress Notes (Signed)
[address]       [place] [address]             [phone]                 10 Days Post-Op Procedure(s) (LRB): CORONARY ARTERY BYPASS GRAFTING (CABG) TIMES FOUR USING LEFT INTERNAL MAMMARY ARTERY, GREATER SAPHENOUS VEIN HARVESTED ENDOSCOPICALLY, AND LEFT RADIAL ARTERY HARVESTED OPEN (N/A) TRANSESOPHAGEAL ECHOCARDIOGRAM (TEE) (N/A) RADIAL ARTERY HARVEST (Left) ENDOVEIN HARVEST OF GREATER SAPHENOUS VEIN (Right) MAZE   Events: No events Seen by speech and approved for clears, but patient refuses to take his medications this diet has not been advanced.   _______________________________________________________________ Vitals: BP 114/64   Pulse 81   Temp 99 F (37.2 C) (Oral)   Resp (!) 28   Ht 6\' 2"  (1.88 m)   Wt 105.9 kg   SpO2 97%   BMI 29.98 kg/m  Filed Weights   07/17/21 0700 07/18/21 0630 07/18/21 0753  Weight: 114.2 kg 106.9 kg 105.9 kg     - Neuro: alert NAD  - Cardiovascular: sinus  Drips:       - Pulm:  Coarse, EWOB  ABG    Component Value Date/Time   PHART 7.459 (H) 07/16/2021 1330   PCO2ART 34.9 07/16/2021 1330   PO2ART 65 (L) 07/16/2021 1330   HCO3 24.8 07/16/2021 1330   TCO2 26 07/16/2021 1330   ACIDBASEDEF 9.0 (H) 07/10/2021 0303   O2SAT 65.5 07/18/2021 0502    - Abd: Soft - Extremity: warm  .Intake/Output      11/03 0701 11/04 0700 11/04 0701 11/05 0700   P.O.  238   I.V. (mL/kg) 21.6 (0.2)    Blood     Other  0   NG/GT 1625 210   IV Piggyback     Total Intake(mL/kg) 1646.6 (15.4) 448 (4.2)   Urine (mL/kg/hr) 0 (0) 0 (0)   Emesis/NG output  0   Other 2020 181   Stool  50   Chest Tube 0    Total Output 2020 231   Net -373.5 +217           _______________________________________________________________ Labs: CBC Latest Ref Rng & Units 07/18/2021 07/17/2021 07/16/2021  WBC 4.0 - 10.5 K/uL 13.8(H) 15.2(H) -  Hemoglobin 13.0 - 17.0 g/dL 8.7(L) 8.4(L) 8.4(L)  Hematocrit 39.0 - 52.0 % 27.0(L) 26.3(L)  25.4(L)  Platelets 150 - 400 K/uL 267 273 -   CMP Latest Ref Rng & Units 07/18/2021 07/17/2021 07/17/2021  Glucose 70 - 99 mg/dL 167(H) 241(H) 156(H)  BUN 6 - 20 mg/dL 39(H) 37(H) 35(H)  Creatinine 0.61 - 1.24 mg/dL 3.76(H) 3.66(H) 3.71(H)  Sodium 135 - 145 mmol/L 138 138 138  Potassium 3.5 - 5.1 mmol/L 4.5 4.4 4.3  Chloride 98 - 111 mmol/L 105 103 105  CO2 22 - 32 mmol/L 27 26 24   Calcium 8.9 - 10.3 mg/dL 7.8(L) 7.9(L) 7.9(L)  Total Protein 6.5 - 8.1 g/dL - - -  Total Bilirubin 0.3 - 1.2 mg/dL - - -  Alkaline Phos 38 - 126 U/L - - -  AST 15 - 41 U/L - - -  ALT 0 - 44 U/L - - -    CXR: -  _______________________________________________________________  Assessment and Plan: POD 10 s/p emergency CABG  Neuro: pain controlled. CV:   On amio PO  Remains in sinus.   Pulm: aggressive pulm toilet.   Renal:   Hopefully can transition to IHD GI: continue  tubefeeds patient must agree to take medications prior to advancing diet. Heme: stable ID: afebrile.   Endo: SSI Dispo: continue ICU care.   Lajuana Matte 07/18/2021 10:27 AM

## 2021-07-18 NOTE — Progress Notes (Signed)
Speech Language Pathology Treatment: Dysphagia  Patient Details Name: Billy [name] MRN: [id number] DOB: December 31, 1965 Today's Date: 07/18/2021 Time: 0910-0920 SLP Time Calculation (min) (ACUTE ONLY): 10 min  Assessment / Plan / Recommendation Clinical Impression  Pt demonstrates no further signs of aspiration or dysphagia. He has tolerated his full liquids meals and is taking meds whole with liquids. Pt able to masticate solids well though he does at times request or initiate a liquid wash. Pt is independent and can upgrade to regular /thin. No SLP f/u needed for swallowing will sign off.   HPI HPI: 55 year old man with acute hypoxic respiratory failure requiring mechanical ventilation and cardiogenic shock requiring titration of dobutamine and acute kidney injury requiring CRRT.  Now s/p three vessel CABG.  Admitted 10/24, intubated 10/27-11/1. Anxiety, concern for aspiration of TF and potential cerebral infarct given right sided weakness, though  no imaging yet.      SLP Plan         Recommendations for follow up therapy are one component of a multi-disciplinary discharge planning process, led by the attending physician.  Recommendations may be updated based on patient status, additional functional criteria and insurance authorization.    Recommendations  Diet recommendations: Regular;Thin liquid Liquids provided via: Cup;Straw                Follow up Recommendations: Inpatient Rehab       GO                Lisaanne Lawrie, Katherene Ponto  07/18/2021, 12:10 PM

## 2021-07-18 NOTE — Progress Notes (Signed)
[facility] Progress Note   Subjective: Much more awake and alert today.  States that he feels pretty good.  Minimal urine output.  Objective Vitals:   07/18/21 0800 07/18/21 0806 07/18/21 0900 07/18/21 1000  BP: 131/82  127/63 114/64  Pulse: 86  84 81  Resp: (!) 25  (!) 35 (!) 28  Temp:  99 F (37.2 C)    TempSrc:  Oral    SpO2: 98%  97% 97%  Weight:      Height:       Physical Exam General: Awake, alert, lying in bed Heart: normal rate Lungs: Bilateral chest rise, no increased work of breathing Abdomen: soft, nontender Extremities: trace edema  Additional Objective Labs: Basic Metabolic Panel: Recent Labs  Lab 07/17/21 0514 07/17/21 1537 07/18/21 0151  NA 138 138 138  K 4.3 4.4 4.5  CL 105 103 105  CO2 24 26 27   GLUCOSE 156* 241* 167*  BUN 35* 37* 39*  CREATININE 3.71* 3.66* 3.76*  CALCIUM 7.9* 7.9* 7.8*  PHOS 2.6 2.6 3.1   Liver Function Tests: Recent Labs  Lab 07/17/21 0514 07/17/21 1537 07/18/21 0151  ALBUMIN 2.1* 2.0* 2.1*   No results for input(s): LIPASE, AMYLASE in the last 168 hours. CBC: Recent Labs  Lab 07/14/21 0326 07/14/21 0358 07/15/21 0455 07/16/21 0415 07/16/21 1330 07/16/21 1625 07/17/21 0514 07/18/21 0151  WBC 9.2  --  11.8* 12.3*  --   --  15.2* 13.8*  NEUTROABS  --   --   --  10.6*  --   --   --   --   HGB 8.3*   < > 7.9* 7.1*   < > 8.4* 8.4* 8.7*  HCT 26.1*   < > 24.5* 22.6*   < > 25.4* 26.3* 27.0*  MCV 96.7  --  97.2 97.8  --   --  95.3 96.4  PLT 178  --  196 222  --   --  273 267   < > = values in this interval not displayed.   Blood Culture    Component Value Date/Time   SDES TRACHEAL ASPIRATE 07/12/2021 0817   SPECREQUEST NONE 07/12/2021 0817   CULT  07/12/2021 0817    Normal respiratory flora-no Staph aureus or Pseudomonas seen Performed at [facility] [address], [place], [place] [address]    REPTSTATUS 07/14/2021 FINAL 07/12/2021 0817    Cardiac Enzymes: No results for  input(s): CKTOTAL, CKMB, CKMBINDEX, TROPONINI in the last 168 hours. CBG: Recent Labs  Lab 07/17/21 1657 07/17/21 1943 07/18/21 0028 07/18/21 0405 07/18/21 0646  GLUCAP 247* 224* 138* 155* 103*   Iron Studies: No results for input(s): IRON, TIBC, TRANSFERRIN, FERRITIN in the last 72 hours. @lablastinr3 @ Studies/Results: DG Abd 1 View  Result Date: 07/17/2021 CLINICAL DATA:  Ileus EXAM: ABDOMEN - 1 VIEW COMPARISON:  None. FINDINGS: Enteric tube appears to course past the pylorus with tip terminating in the distal third portion of the duodenum. Prominent loop of large bowel in the left lower quadrant measures near the upper limit of normal. No dilated loops of small bowel identified. No pathologic calcifications. No acute osseous abnormality. IMPRESSION: Enteric tube appears to terminate in the third portion of the duodenum. No significantly dilated loops of small bowel identified. Prominent air-filled loop of large bowel with left lower quadrant measures at the upper limits normal. Electronically Signed   By: Albin Felling M.D.   On: 07/17/2021 14:43   CT HEAD WO CONTRAST (  5MM)  Result Date: 07/17/2021 CLINICAL DATA:  Stroke follow-up EXAM: CT HEAD WITHOUT CONTRAST TECHNIQUE: Contiguous axial images were obtained from the base of the skull through the vertex without intravenous contrast. COMPARISON:  None. FINDINGS: Brain: There is no mass, hemorrhage or extra-axial collection. The size and configuration of the ventricles and extra-axial CSF spaces are normal. The brain parenchyma is normal, without acute or chronic infarction. Vascular: No abnormal hyperdensity of the major intracranial arteries or dural venous sinuses. No intracranial atherosclerosis. Skull: The visualized skull base, calvarium and extracranial soft tissues are normal. Sinuses/Orbits: No fluid levels or advanced mucosal thickening of the visualized paranasal sinuses. No mastoid or middle ear effusion. The orbits are normal.  IMPRESSION: Normal head CT. Electronically Signed   By: Ulyses Jarred M.D.   On: 07/17/2021 02:37   Medications:   prismasol BGK 4/2.5 400 mL/hr at 07/17/21 2015    prismasol BGK 4/2.5 300 mL/hr at 07/18/21 0102   sodium chloride Stopped (07/13/21 0234)   albumin human 12.5 g (07/08/21 1827)   And   sodium chloride Stopped (07/08/21 2051)   feeding supplement (VITAL 1.5 CAL) 60 mL/hr at 07/18/21 1000   heparin 10,000 units/ 20 mL infusion syringe 500 Units/hr (07/18/21 0950)   lactated ringers Stopped (07/16/21 0848)   prismasol BGK 4/2.5 2,000 mL/hr at 07/18/21 0937   promethazine (PHENERGAN) injection (IM or IVPB) Stopped (07/09/21 1628)    ALPRAZolam  1 mg Oral TID   amiodarone  200 mg Per Tube BID   Followed by   Derrill Memo ON 07/23/2021] amiodarone  200 mg Per Tube Daily   aspirin  81 mg Per Tube Daily   Or   aspirin EC  81 mg Oral Daily   atorvastatin  80 mg Per Tube Daily   B-complex with vitamin C  1 tablet Per Tube Daily   carvedilol  3.125 mg Oral BID WC   chlorhexidine  15 mL Mouth Rinse BID   Chlorhexidine Gluconate Cloth  6 each Topical Daily   clopidogrel  75 mg Per Tube Daily   feeding supplement (PROSource TF)  45 mL Per Tube QID   heparin injection (subcutaneous)  5,000 Units Subcutaneous Q8H   hydrALAZINE  12.5 mg Oral TID   insulin aspart  3-9 Units Subcutaneous Q4H   insulin aspart  6 Units Subcutaneous Q4H   insulin detemir  20 Units Subcutaneous BID   isosorbide dinitrate  10 mg Oral TID   mouth rinse  15 mL Mouth Rinse q12n4p   multivitamin with minerals  1 tablet Oral Daily   pantoprazole sodium  40 mg Per Tube Daily   sertraline  50 mg Oral Daily   sodium chloride flush  3 mL Intravenous Q12H   traZODone  100 mg Per Tube QHS     Assessment/Plan: 55yo M DM, HTN, A fib, CKD currently being followed for severe AKI s/p CABG x 4 and MAZE.   **Severe AKI on CKD 3b:  Baseline 1.6 (Followed by Dr. Moshe Cipro outpt), was 1.9 prior to CABG and now AKI in  setting of hemodynamic instability post op likely ATN but cholesterol embolic injury possible.  Initiated CRRT 10/27 for UF with ~anuria and volume overload.  Now extubated with improvement.  Urine output fairly minimal at this time but there is still chance for recovery.  Running well on CRRT today.  We will plan to no restart today or take off this evening.  We will address needs for further renal replacement therapy based  on labs and clinical progress.  Also likely will consult IR over the weekend for plans for tunneled dialysis catheter early next week.  **Anemia: Hemoglobin stable in the eights.  Transfuse as needed.  May consider ESA next week  **Hyponatremia: Has resolved with volume removal.  **CAD s/p CABG  **A fib: amiodarone, anticoag.  **DM: per primary  **Shock: No longer requiring norepinephrine or dobutamine.  Resolved  **Pneumonia: Was on cefepime.  Now stopped likely resolved.

## 2021-07-18 NOTE — Progress Notes (Signed)
Cortrak Tube Team Note:  RN asked Cortrak RD to pull Cortrak out a few centimeters (had been bridled at 100cm) due to pt c/o discomfort. Cortrak pulled back to 88cm and bridled in L nare. RN made aware.  X-ray is required, abdominal x-ray has been ordered by the Cortrak team. Please confirm tube placement before using the Cortrak tube.   If the tube becomes dislodged please keep the tube and contact the Cortrak team at [email or web address] (password [email or web address]) for replacement.  If after hours and replacement cannot be delayed, place a NG tube and confirm placement with an abdominal x-ray.    Tube Type:  Cortrak - 43 inches Tube Location:  Left nare Initial Placement:  Postpyloric Secured by: Bridle Technique Used to Measure Tube Placement:  Marking at nare/corner of mouth Cortrak Secured At:  88 cm  Larkin Ina, MS, RD, LDN (she/her/hers) RD pager number and weekend/on-call pager number located in [place].

## 2021-07-18 NOTE — Progress Notes (Signed)
      [address]       [place] [address]             [phone]      POD  # 10   Still refusing meds  BP 115/63 (BP Location: Left Arm)   Pulse 76   Temp 98.2 F (36.8 C) (Oral)   Resp (!) 37   Ht 6\' 2"  (1.88 m)   Wt 105.9 kg   SpO2 97%   BMI 29.98 kg/m   Intake/Output Summary (Last 24 hours) at 07/18/2021 1646 Last data filed at 07/18/2021 1600 Gross per 24 hour  Intake 1758 ml  Output 1884 ml  Net -126 ml   Still on CRRT  Chinelo Benn C. Roxan Hockey, MD [facility] [phone]

## 2021-07-19 DIAGNOSIS — N179 Acute kidney failure, unspecified: Secondary | ICD-10-CM | POA: Diagnosis not present

## 2021-07-19 DIAGNOSIS — Z951 Presence of aortocoronary bypass graft: Secondary | ICD-10-CM | POA: Diagnosis not present

## 2021-07-19 DIAGNOSIS — I5043 Acute on chronic combined systolic (congestive) and diastolic (congestive) heart failure: Secondary | ICD-10-CM | POA: Diagnosis not present

## 2021-07-19 DIAGNOSIS — J9601 Acute respiratory failure with hypoxia: Secondary | ICD-10-CM | POA: Diagnosis not present

## 2021-07-19 DIAGNOSIS — R570 Cardiogenic shock: Secondary | ICD-10-CM | POA: Diagnosis not present

## 2021-07-19 LAB — RENAL FUNCTION PANEL
Albumin: 2.3 g/dL — ABNORMAL LOW (ref 3.5–5.0)
Anion gap: 8 (ref 5–15)
BUN: 53 mg/dL — ABNORMAL HIGH (ref 6–20)
CO2: 25 mmol/L (ref 22–32)
Calcium: 8.2 mg/dL — ABNORMAL LOW (ref 8.9–10.3)
Chloride: 102 mmol/L (ref 98–111)
Creatinine, Ser: 4.74 mg/dL — ABNORMAL HIGH (ref 0.61–1.24)
GFR, Estimated: 14 mL/min — ABNORMAL LOW (ref >60)
Glucose, Bld: 191 mg/dL — ABNORMAL HIGH (ref 70–99)
Phosphorus: 4 mg/dL (ref 2.5–4.6)
Potassium: 4.2 mmol/L (ref 3.5–5.1)
Sodium: 135 mmol/L (ref 135–145)

## 2021-07-19 LAB — CBC
HCT: 25.4 % — ABNORMAL LOW (ref 39.0–52.0)
Hemoglobin: 8.2 g/dL — ABNORMAL LOW (ref 13.0–17.0)
MCH: 31.4 pg (ref 26.0–34.0)
MCHC: 32.3 g/dL (ref 30.0–36.0)
MCV: 97.3 fL (ref 80.0–100.0)
Platelets: 319 10*3/uL (ref 150–400)
RBC: 2.61 MIL/uL — ABNORMAL LOW (ref 4.22–5.81)
RDW: 13.2 % (ref 11.5–15.5)
WBC: 15.7 10*3/uL — ABNORMAL HIGH (ref 4.0–10.5)
nRBC: 0 % (ref 0.0–0.2)

## 2021-07-19 LAB — GLUCOSE, CAPILLARY
Glucose-Capillary: 147 mg/dL — ABNORMAL HIGH (ref 70–99)
Glucose-Capillary: 184 mg/dL — ABNORMAL HIGH (ref 70–99)
Glucose-Capillary: 117 mg/dL — ABNORMAL HIGH (ref 70–99)
Glucose-Capillary: 165 mg/dL — ABNORMAL HIGH (ref 70–99)
Glucose-Capillary: 104 mg/dL — ABNORMAL HIGH (ref 70–99)

## 2021-07-19 LAB — HEPATITIS B SURFACE ANTIGEN: Hepatitis B Surface Ag: NONREACTIVE

## 2021-07-19 LAB — COOXEMETRY PANEL
Carboxyhemoglobin: 1.7 % — ABNORMAL HIGH (ref 0.5–1.5)
Methemoglobin: 1.1 % (ref 0.0–1.5)
O2 Saturation: 53.5 %
Total hemoglobin: 8.2 g/dL — ABNORMAL LOW (ref 12.0–16.0)

## 2021-07-19 LAB — HEPATITIS B SURFACE ANTIBODY,QUALITATIVE: Hep B S Ab: NONREACTIVE

## 2021-07-19 LAB — HEPARIN LEVEL (UNFRACTIONATED): Heparin Unfractionated: <0.1 IU/mL — ABNORMAL LOW (ref 0.30–0.70)

## 2021-07-19 LAB — MAGNESIUM: Magnesium: 2.9 mg/dL — ABNORMAL HIGH (ref 1.7–2.4)

## 2021-07-19 MED ORDER — HEPARIN SODIUM (PORCINE) 1000 UNIT/ML DIALYSIS
1000.0000 [IU] | INTRAMUSCULAR | Status: DC | PRN
  Administered 2021-07-20: 1000 [IU] via INTRAVENOUS_CENTRAL
  Filled 2021-07-19 (×3): qty 1

## 2021-07-19 MED ORDER — ASPIRIN 81 MG PO CHEW
81.0000 mg | CHEWABLE_TABLET | Freq: Every day | ORAL | Status: DC
  Administered 2021-07-19 – 2021-07-22 (×3): 81 mg via ORAL
  Filled 2021-07-19 (×3): qty 1

## 2021-07-19 MED ORDER — ATORVASTATIN CALCIUM 80 MG PO TABS
80.0000 mg | ORAL_TABLET | Freq: Every day | ORAL | Status: DC
  Administered 2021-07-19 – 2021-08-06 (×19): 80 mg via ORAL
  Filled 2021-07-19 (×19): qty 1

## 2021-07-19 MED ORDER — AMIODARONE HCL 200 MG PO TABS
200.0000 mg | ORAL_TABLET | Freq: Two times a day (BID) | ORAL | Status: DC
  Administered 2021-07-19 – 2021-07-21 (×6): 200 mg via ORAL
  Filled 2021-07-19 (×6): qty 1

## 2021-07-19 MED ORDER — CHLORHEXIDINE GLUCONATE CLOTH 2 % EX PADS
6.0000 | MEDICATED_PAD | Freq: Every day | CUTANEOUS | Status: DC
  Administered 2021-07-19 – 2021-07-21 (×2): 6 via TOPICAL

## 2021-07-19 MED ORDER — ALTEPLASE 2 MG IJ SOLR
2.0000 mg | Freq: Once | INTRAMUSCULAR | Status: DC | PRN
  Filled 2021-07-19: qty 2

## 2021-07-19 MED ORDER — PENTAFLUOROPROP-TETRAFLUOROETH EX AERO
1.0000 "application " | INHALATION_SPRAY | CUTANEOUS | Status: DC | PRN
  Filled 2021-07-19: qty 116

## 2021-07-19 MED ORDER — AMIODARONE HCL 200 MG PO TABS: 200.0000 mg | ORAL_TABLET | Freq: Every day | ORAL | Status: DC

## 2021-07-19 MED ORDER — LIDOCAINE-PRILOCAINE 2.5-2.5 % EX CREA: 1.0000 "application " | TOPICAL_CREAM | CUTANEOUS | Status: DC | PRN

## 2021-07-19 MED ORDER — SODIUM CHLORIDE 0.9 % IV SOLN: 100.0000 mL | INTRAVENOUS | Status: DC | PRN

## 2021-07-19 MED ORDER — ASPIRIN EC 81 MG PO TBEC
81.0000 mg | DELAYED_RELEASE_TABLET | Freq: Every day | ORAL | Status: DC
  Administered 2021-07-21: 81 mg via ORAL
  Filled 2021-07-19: qty 1

## 2021-07-19 MED ORDER — B COMPLEX-C PO TABS
1.0000 | ORAL_TABLET | Freq: Every day | ORAL | Status: DC
  Administered 2021-07-20 – 2021-07-21 (×2): 1 via ORAL
  Filled 2021-07-19 (×4): qty 1

## 2021-07-19 MED ORDER — HEPARIN (PORCINE) 25000 UT/250ML-% IV SOLN
600.0000 [IU]/h | INTRAVENOUS | Status: DC
  Administered 2021-07-19 – 2021-07-21 (×2): 600 [IU]/h via INTRAVENOUS
  Filled 2021-07-19 (×2): qty 250

## 2021-07-19 MED ORDER — LIDOCAINE HCL (PF) 1 % IJ SOLN: 5.0000 mL | INTRAMUSCULAR | Status: DC | PRN

## 2021-07-19 MED ORDER — CEFAZOLIN SODIUM-DEXTROSE 2-4 GM/100ML-% IV SOLN: 2.0000 g | Freq: Once | INTRAVENOUS | Status: AC

## 2021-07-19 MED ORDER — PANTOPRAZOLE SODIUM 40 MG PO TBEC
40.0000 mg | DELAYED_RELEASE_TABLET | Freq: Every day | ORAL | Status: DC
  Administered 2021-07-19 – 2021-08-06 (×18): 40 mg via ORAL
  Filled 2021-07-19 (×18): qty 1

## 2021-07-19 MED ORDER — TRAZODONE HCL 50 MG PO TABS
100.0000 mg | ORAL_TABLET | Freq: Every day | ORAL | Status: DC
  Administered 2021-07-19 – 2021-08-05 (×18): 100 mg via ORAL
  Filled 2021-07-19 (×18): qty 2

## 2021-07-19 MED ORDER — CLOPIDOGREL BISULFATE 75 MG PO TABS
75.0000 mg | ORAL_TABLET | Freq: Every day | ORAL | Status: DC
  Administered 2021-07-19 – 2021-08-06 (×19): 75 mg via ORAL
  Filled 2021-07-19 (×19): qty 1

## 2021-07-19 MED ORDER — INSULIN DETEMIR 100 UNIT/ML ~~LOC~~ SOLN
25.0000 [IU] | Freq: Two times a day (BID) | SUBCUTANEOUS | Status: DC
  Administered 2021-07-19 – 2021-07-20 (×4): 25 [IU] via SUBCUTANEOUS
  Filled 2021-07-19 (×6): qty 0.25

## 2021-07-19 NOTE — Progress Notes (Signed)
11 Days Post-Op Procedure(s) (LRB): CORONARY ARTERY BYPASS GRAFTING (CABG) TIMES FOUR USING LEFT INTERNAL MAMMARY ARTERY, GREATER SAPHENOUS VEIN HARVESTED ENDOSCOPICALLY, AND LEFT RADIAL ARTERY HARVESTED OPEN (N/A) TRANSESOPHAGEAL ECHOCARDIOGRAM (TEE) (N/A) RADIAL ARTERY HARVEST (Left) ENDOVEIN HARVEST OF GREATER SAPHENOUS VEIN (Right) MAZE Subjective: Denies pain, nausea  Objective: Vital signs in last 24 hours: Temp:  [98.2 F (36.8 C)-98.7 F (37.1 C)] 98.7 F (37.1 C) (11/05 0800) Pulse Rate:  [76-134] 82 (11/05 0814) Cardiac Rhythm: Normal sinus rhythm (11/05 0800) Resp:  [20-40] 20 (11/05 0814) BP: (92-137)/(53-91) 134/73 (11/05 0800) SpO2:  [89 %-100 %] 95 % (11/05 0814) Weight:  [108.9 kg] 108.9 kg (11/05 0500)  Hemodynamic parameters for last 24 hours:    Intake/Output from previous day: 11/04 0701 - 11/05 0700 In: 1248 [P.O.:288; NG/GT:960] Out: 863 [Stool:150] Intake/Output this shift: Total I/O In: 60 [NG/GT:60] Out: 50 [Stool:50]  General appearance: alert, cooperative, and no distress Neurologic: intact Heart: regular rate and rhythm Lungs: diminished breath sounds left base Abdomen: distended, tympanitic, nontender  Lab Results: Recent Labs    07/18/21 0151 07/19/21 0313  WBC 13.8* 15.7*  HGB 8.7* 8.2*  HCT 27.0* 25.4*  PLT 267 319   BMET:  Recent Labs    07/18/21 1600 07/19/21 0313  NA 136 135  K 4.3 4.2  CL 103 102  CO2 27 25  GLUCOSE 135* 191*  BUN 37* 53*  CREATININE 3.61* 4.74*  CALCIUM 7.8* 8.2*    PT/INR: No results for input(s): LABPROT, INR in the last 72 hours. ABG    Component Value Date/Time   PHART 7.459 (H) 07/16/2021 1330   HCO3 24.8 07/16/2021 1330   TCO2 26 07/16/2021 1330   ACIDBASEDEF 9.0 (H) 07/10/2021 0303   O2SAT 53.5 07/19/2021 0313   CBG (last 3)  Recent Labs    07/18/21 1949 07/19/21 0315 07/19/21 0818  GLUCAP 168* 147* 184*    Assessment/Plan: S/P Procedure(s) (LRB): CORONARY ARTERY BYPASS  GRAFTING (CABG) TIMES FOUR USING LEFT INTERNAL MAMMARY ARTERY, GREATER SAPHENOUS VEIN HARVESTED ENDOSCOPICALLY, AND LEFT RADIAL ARTERY HARVESTED OPEN (N/A) TRANSESOPHAGEAL ECHOCARDIOGRAM (TEE) (N/A) RADIAL ARTERY HARVEST (Left) ENDOVEIN HARVEST OF GREATER SAPHENOUS VEIN (Right) MAZE - NEURO- intact CV- in SR, BP normal  Co-ox down to 53 this AM RESP- elevation of left hemidiaphragm   Sats are good RENAL- acute kidney injury in setting of stage IIIB CKD creatinine up to 4.7, oliguric  For HD today ENDO- CBG moderately elevated GI- gastric dilatation, ileus  Stomach still distended on exam, but no nausea  Cor track dc'ed  Monitor Anemia- Hgb down slightly, monitor SCD for DVT prophylaxis    LOS: 12 days    Billy [name] 07/19/2021

## 2021-07-19 NOTE — Progress Notes (Signed)
[facility] Progress Note   Subjective: Patient doing well today.  Some shortness of breath but states it is largely unchanged.  Minimal urine output.  Objective Vitals:   07/19/21 0600 07/19/21 0700 07/19/21 0800 07/19/21 0814  BP: 134/77 (!) 129/58 134/73   Pulse: 92 93 92 82  Resp: (!) 36 (!) 30 (!) 22 20  Temp:   98.7 F (37.1 C)   TempSrc:   Oral   SpO2: 94% 95% 95% 95%  Weight:      Height:       Physical Exam General: Awake, alert, lying in bed Heart: normal rate Lungs: Bilateral chest rise, no increased work of breathing Abdomen: soft, nontender Extremities: trace edema  Additional Objective Labs: Basic Metabolic Panel: Recent Labs  Lab 07/18/21 0151 07/18/21 1600 07/19/21 0313  NA 138 136 135  K 4.5 4.3 4.2  CL 105 103 102  CO2 27 27 25   GLUCOSE 167* 135* 191*  BUN 39* 37* 53*  CREATININE 3.76* 3.61* 4.74*  CALCIUM 7.8* 7.8* 8.2*  PHOS 3.1 2.9 4.0   Liver Function Tests: Recent Labs  Lab 07/18/21 0151 07/18/21 1600 07/19/21 0313  ALBUMIN 2.1* 2.2* 2.3*   No results for input(s): LIPASE, AMYLASE in the last 168 hours. CBC: Recent Labs  Lab 07/15/21 0455 07/16/21 0415 07/16/21 1330 07/17/21 0514 07/18/21 0151 07/19/21 0313  WBC 11.8* 12.3*  --  15.2* 13.8* 15.7*  NEUTROABS  --  10.6*  --   --   --   --   HGB 7.9* 7.1*   < > 8.4* 8.7* 8.2*  HCT 24.5* 22.6*   < > 26.3* 27.0* 25.4*  MCV 97.2 97.8  --  95.3 96.4 97.3  PLT 196 222  --  273 267 319   < > = values in this interval not displayed.   Blood Culture    Component Value Date/Time   SDES TRACHEAL ASPIRATE 07/12/2021 0817   SPECREQUEST NONE 07/12/2021 0817   CULT  07/12/2021 0817    Normal respiratory flora-no Staph aureus or Pseudomonas seen Performed at [facility] [address], [place], [place] [address]    REPTSTATUS 07/14/2021 FINAL 07/12/2021 0817    Cardiac Enzymes: No results for input(s): CKTOTAL, CKMB, CKMBINDEX, TROPONINI in the last 168  hours. CBG: Recent Labs  Lab 07/18/21 1140 07/18/21 1553 07/18/21 1949 07/19/21 0315 07/19/21 0818  GLUCAP 231* 115* 168* 147* 184*   Iron Studies: No results for input(s): IRON, TIBC, TRANSFERRIN, FERRITIN in the last 72 hours. @lablastinr3 @ Studies/Results: DG Abd 1 View  Result Date: 07/17/2021 CLINICAL DATA:  Ileus EXAM: ABDOMEN - 1 VIEW COMPARISON:  None. FINDINGS: Enteric tube appears to course past the pylorus with tip terminating in the distal third portion of the duodenum. Prominent loop of large bowel in the left lower quadrant measures near the upper limit of normal. No dilated loops of small bowel identified. No pathologic calcifications. No acute osseous abnormality. IMPRESSION: Enteric tube appears to terminate in the third portion of the duodenum. No significantly dilated loops of small bowel identified. Prominent air-filled loop of large bowel with left lower quadrant measures at the upper limits normal. Electronically Signed   By: Albin Felling M.D.   On: 07/17/2021 14:43   DG CHEST PORT 1 VIEW  Result Date: 07/18/2021 CLINICAL DATA:  Hypoxia. EXAM: PORTABLE CHEST 1 VIEW COMPARISON:  Chest radiograph dated 07/16/2021. FINDINGS: Interval removal of the right IJ central venous line. A feeding tube is noted  with tip suboptimally visualized but below the diaphragm. There is elevation of the left hemidiaphragm with left lung base atelectasis. The right lung is clear. No pleural effusion pneumothorax. Stable cardiomegaly. Median sternotomy wires and CABG vascular clips. No acute osseous pathology. IMPRESSION: Elevated left hemidiaphragm with left lung base atelectasis. Electronically Signed   By: Anner Crete M.D.   On: 07/18/2021 20:51   DG Abd Portable 1V  Result Date: 07/18/2021 CLINICAL DATA:  Feeding tube placement EXAM: PORTABLE ABDOMEN - 1 VIEW COMPARISON:  07/17/2021 FINDINGS: There is gaseous distention of stomach. Enteric tube is noted following the course 3rd to 4th  portion duodenum. There is no significant small bowel dilation. Gas is present in colon. Degenerative changes are noted in lumbar spine. Left hemidiaphragm is elevated. IMPRESSION: There is no significant change in position of enteric tube with its tip in the region of 3rd to 4th portion of duodenum.There is gaseous distention of stomach. There is no significant small bowel dilation. Electronically Signed   By: Elmer Picker M.D.   On: 07/18/2021 13:23   ECHOCARDIOGRAM LIMITED  Result Date: 07/18/2021    ECHOCARDIOGRAM LIMITED REPORT   Patient Name:   Billy [name] Date of Exam: 07/18/2021 Medical Rec #:  [id number]      Height:       74.0 in Accession #:    [id number]     Weight:       233.5 lb Date of Birth:  05-Apr-1966      BSA:          2.321 m Patient Age:    55 years       BP:           114/74 mmHg Patient Gender: M              HR:           82 bpm. Exam Location:  Inpatient Procedure: Limited Echo Indications:     Cardiomyopathy  History:         Patient has prior history of Echocardiogram examinations, most                  recent 07/11/2021. CHF, CAD and Previous Myocardial Infarction,                  Prior CABG and MAZE procedure, Arrythmias:Atrial Fibrillation,                  Signs/Symptoms:CKD; Risk Factors:Diabetes, Hypertension and                  Obesity.  Sonographer:     Dustin Flock RDCS Referring Phys:  [id number] [name] Diagnosing Phys: Eleonore Chiquito MD IMPRESSIONS  1. Limited study. LVEF appears to have improved 45-50%. Global hypokinesis. RV function appears normal.  2. Left ventricular ejection fraction, by estimation, is 45 to 50%. The left ventricle has mildly decreased function. The left ventricle demonstrates global hypokinesis.  3. Right ventricular systolic function is normal. The right ventricular size is normal.  4. The mitral valve is grossly normal. Comparison(s): Changes from prior study are noted. FINDINGS  Left Ventricle: Left ventricular ejection fraction,  by estimation, is 45 to 50%. The left ventricle has mildly decreased function. The left ventricle demonstrates global hypokinesis. The left ventricular internal cavity size was normal in size. There is  no left ventricular hypertrophy. Right Ventricle: The right ventricular size is normal. No increase in right ventricular wall thickness. Right ventricular systolic function  is normal. Pericardium: There is no evidence of pericardial effusion. Mitral Valve: The mitral valve is grossly normal. Mild to moderate mitral annular calcification. LEFT VENTRICLE PLAX 2D LVIDd:         4.92 cm      Diastology LVIDs:         3.50 cm      LV e' medial:  7.18 cm/s LV PW:         1.06 cm      LV e' lateral: 5.87 cm/s LV IVS:        1.03 cm  LV Volumes (MOD) LV vol d, MOD A4C: 138.0 ml LV vol s, MOD A4C: 63.1 ml LV SV MOD A4C:     138.0 ml Eleonore Chiquito MD Electronically signed by Eleonore Chiquito MD Signature Date/Time: 07/18/2021/12:46:29 PM    Final (Updated)    Medications:  sodium chloride Stopped (07/13/21 0234)   albumin human 12.5 g (07/08/21 1827)   And   sodium chloride Stopped (07/08/21 2051)   lactated ringers Stopped (07/16/21 0848)   promethazine (PHENERGAN) injection (IM or IVPB) Stopped (07/09/21 1628)    ALPRAZolam  1 mg Oral TID   amiodarone  200 mg Per Tube BID   Followed by   Derrill Memo ON 07/23/2021] amiodarone  200 mg Per Tube Daily   aspirin  81 mg Per Tube Daily   Or   aspirin EC  81 mg Oral Daily   atorvastatin  80 mg Per Tube Daily   B-complex with vitamin C  1 tablet Per Tube Daily   carvedilol  3.125 mg Oral BID WC   chlorhexidine  15 mL Mouth Rinse BID   Chlorhexidine Gluconate Cloth  6 each Topical Daily   Chlorhexidine Gluconate Cloth  6 each Topical Q0600   clopidogrel  75 mg Per Tube Daily   feeding supplement (PROSource TF)  45 mL Per Tube QID   heparin injection (subcutaneous)  5,000 Units Subcutaneous Q8H   hydrALAZINE  12.5 mg Oral TID   insulin aspart  3-9 Units Subcutaneous Q4H    insulin aspart  6 Units Subcutaneous Q4H   insulin detemir  20 Units Subcutaneous BID   isosorbide dinitrate  10 mg Oral TID   mouth rinse  15 mL Mouth Rinse q12n4p   metoCLOPramide (REGLAN) injection  10 mg Intravenous Q6H   multivitamin with minerals  1 tablet Oral Daily   pantoprazole sodium  40 mg Per Tube Daily   sertraline  50 mg Oral Daily   sodium chloride flush  3 mL Intravenous Q12H   traZODone  100 mg Per Tube QHS     Assessment/Plan: 55yo M DM, HTN, A fib, CKD currently being followed for severe AKI s/p CABG x 4 and MAZE.   **Severe AKI on CKD 3b:  Baseline 1.6 (Followed by Dr. Moshe Cipro outpt), was 1.9 prior to CABG and now AKI in setting of hemodynamic instability post op likely ATN but cholesterol embolic injury possible.  Initiated CRRT 10/27 for UF with ~anuria and volume overload.  Now extubated with improvement.  Urine output has intermittently been present but fairly low.  Off of CRRT on 11/4.  Will perform dialysis today and keep on TTS schedule as tolerated until we see signs of recovery -IR consult placed to switch temporary to tunneled HD cath next week  **Anemia: Hemoglobin stable in the 8s.  Transfuse as needed.  May consider ESA next week  **Hyponatremia: Has resolved with volume removal.  **CAD s/p CABG  **  A fib: amiodarone, anticoag.  **DM: per primary  **Shock: No longer requiring norepinephrine or dobutamine.  Resolved  **Pneumonia: s/p treatment

## 2021-07-19 NOTE — Progress Notes (Signed)
   NAME:  Billy [name], MRN:  [id number], DOB:  Dec 22, 1965, LOS: 12 ADMISSION DATE:  07/07/2021, CONSULTATION DATE:  07/19/2021  REFERRING MD:  Lynnette Caffey, CHIEF COMPLAINT:  cardiogenic shock , on bipap   History of Present Illness:  55 year old diabetic, hypertensive admitted 10/24 with STEMI.  He had recent admission 10/11 for chest pain and hypertensive urgency with normal LVEF on echo.  He underwent left heart cath which showed three-vessel disease, underwent PCI with DES to left circumflex followed by CABG x4 with radial harvest and maze procedure.  Intra-Op EF was noted to be 20%.  He was extubated postop day 1 but developed progressive shock requiring Levophed and vasopressin with AKI and oliguria.  He was placed on BiPAP for hypoxia, ABG showed metabolic acidosis for which she was given 3 amps of bicarb and PCCM consulted Seen by heart failure service, coox 56% started on milrinone  Pertinent  Medical History  Diabetes Hypertension CKD stage II -Baseline creatinine 1.8 Paroxysmal atrial fibrillation Anxiety -on Xanax 2 mg thrice daily  Significant Hospital Events:   10/24 admitted with STEMI  10/27 PCCM consulted, due to respiratory issues intubated early morning   10/28 No acute events overnight, flipped back into NSR with less pressor requirement and decreased FIO2 this AM 10/30 increasing FiO2 again as fluid removal decreased.  Copious secretions. 11/1 extubated 11/2 off dobutamine.   Interim History / Subjective:  CRRT is off. Patient in good spirits Some question of coughing and tachypnea overnight that seems more c/w agitation rather than respiratory distress On minimal O2  Objective   Blood pressure (!) 129/58, pulse 93, temperature 98.4 F (36.9 C), temperature source Oral, resp. rate (!) 30, height 6\' 2"  (1.88 m), weight 108.9 kg, SpO2 95 %.        Intake/Output Summary (Last 24 hours) at 07/19/2021 0814 Last data filed at 07/19/2021 0600 Gross per 24 hour   Intake 1158 ml  Output 750 ml  Net 408 ml    Filed Weights   07/18/21 0630 07/18/21 0753 07/19/21 0500  Weight: 106.9 kg 105.9 kg 108.9 kg    Examination: No distress Cortrak in place Lungs diminished at bases Sternotomy scar well healed Abdomen soft but distended, nontender, +BS  BUN/Cr up a bit Bps good No urine  Ancillary tests  His left hemidiaphragm elevation is chronic  Assessment & Plan:   Post 3 vessel CABG complicated by shock, renal failure, neuromuscular weakness, ileus- overall improving, off inotropes.  Cortrak is really bothering him and contributing to agitation, taking PO, making stool.  - Continue reglan - Push mobility - iHD per nephrology - Encourage IS - Basal bolus insulin as ordered - DC cortrak, trial of full PO idet, continue reglan, if s/s of worsening ileus or aspiration we will need to do a real NGT and hook to LIS, discussed with patient - Keep in ICU until we make sure tolerating diet and no respiratory decompensation, will follow  Erskine Emery MD PCCM

## 2021-07-19 NOTE — Progress Notes (Signed)
Pt refused RN to check BGL, and was swatting at the RN when asking for a BGL. Pt was educated on the need for his blood glucose to be monitored. Pt refused and said "Get away from me". RN educated pt on sign and symptoms of hypo/hyper-glycemia. Pt states he is aware and would like for me to leave the room.

## 2021-07-19 NOTE — Progress Notes (Signed)
Patient ID: KOSTON HENNES, male   DOB: 12/20/1965, 55 y.o.   MRN: [id number]     Advanced Heart Failure Rounding Note  PCP-Cardiologist: None   Subjective:    10/25: S/P CABG x4 with MAZE . Intra Op EF 20%.  10/27: Intubated. Hypotensive. Placed on Norepi + Vaso. Recurrent A fib with amio increased to 60 mg/hr.  10/29: Co-ox low, DBA added 11/1: Extubated. AF with RVR, amio gtt restarted 11/03: Dobutamine off  Off NE and DBA, Co-ox 66% -> 54%  CVVHD stopped 10/4. Weight up 7 pounds (?). Remains anuric   No further AF overnight.   Denies CP or SOB.   Objective:   Weight Range: 108.9 kg Body mass index is 30.82 kg/m.   Vital Signs:   Temp:  [98.2 F (36.8 C)-98.7 F (37.1 C)] 98.7 F (37.1 C) (11/05 0800) Pulse Rate:  [76-134] 82 (11/05 0814) Resp:  [20-40] 20 (11/05 0814) BP: (92-137)/(53-91) 134/73 (11/05 0800) SpO2:  [89 %-100 %] 95 % (11/05 0814) Weight:  [108.9 kg] 108.9 kg (11/05 0500) Last BM Date: 07/19/21  Weight change: Filed Weights   07/18/21 0630 07/18/21 0753 07/19/21 0500  Weight: 106.9 kg 105.9 kg 108.9 kg    Intake/Output:   Intake/Output Summary (Last 24 hours) at 07/19/2021 0928 Last data filed at 07/19/2021 0800 Gross per 24 hour  Intake 1040 ml  Output 712 ml  Net 328 ml       Physical Exam   General:  Sitting up in bed No resp difficulty HEENT: normal Neck: supple. JVP to jaw. Carotids 2+ bilat; no bruits. No lymphadenopathy or thryomegaly appreciated. Cor: Sternal wound ok. PMI nondisplaced. Regular rate & rhythm. No rubs, gallops or murmurs. Lungs: clear Abdomen: soft, nontender, nondistended. No hepatosplenomegaly. No bruits or masses. Good bowel sounds. Extremities: no cyanosis, clubbing, rash, tr edema Neuro: alert & orientedx3, cranial nerves grossly intact. moves all 4 extremities w/o difficulty. Affect pleasant   Telemetry   NSR 80s No AF Personally reviewed   Labs    CBC Recent Labs    07/18/21 0151  07/19/21 0313  WBC 13.8* 15.7*  HGB 8.7* 8.2*  HCT 27.0* 25.4*  MCV 96.4 97.3  PLT 267 673    Basic Metabolic Panel Recent Labs    07/18/21 0151 07/18/21 1600 07/19/21 0313  NA 138 136 135  K 4.5 4.3 4.2  CL 105 103 102  CO2 27 27 25   GLUCOSE 167* 135* 191*  BUN 39* 37* 53*  CREATININE 3.76* 3.61* 4.74*  CALCIUM 7.8* 7.8* 8.2*  MG 2.8*  --  2.9*  PHOS 3.1 2.9 4.0    Liver Function Tests Recent Labs    07/18/21 1600 07/19/21 0313  ALBUMIN 2.2* 2.3*    No results for input(s): LIPASE, AMYLASE in the last 72 hours. Cardiac Enzymes No results for input(s): CKTOTAL, CKMB, CKMBINDEX, TROPONINI in the last 72 hours.  BNP: BNP (last 3 results) No results for input(s): BNP in the last 8760 hours.  ProBNP (last 3 results) No results for input(s): PROBNP in the last 8760 hours.   D-Dimer No results for input(s): DDIMER in the last 72 hours. Hemoglobin A1C No results for input(s): HGBA1C in the last 72 hours.  Fasting Lipid Panel No results for input(s): CHOL, HDL, LDLCALC, TRIG, CHOLHDL, LDLDIRECT in the last 72 hours.   Thyroid Function Tests No results for input(s): TSH, T4TOTAL, T3FREE, THYROIDAB in the last 72 hours.  Invalid input(s): FREET3  Other results:  Imaging    DG CHEST PORT 1 VIEW  Result Date: 07/18/2021 CLINICAL DATA:  Hypoxia. EXAM: PORTABLE CHEST 1 VIEW COMPARISON:  Chest radiograph dated 07/16/2021. FINDINGS: Interval removal of the right IJ central venous line. A feeding tube is noted with tip suboptimally visualized but below the diaphragm. There is elevation of the left hemidiaphragm with left lung base atelectasis. The right lung is clear. No pleural effusion pneumothorax. Stable cardiomegaly. Median sternotomy wires and CABG vascular clips. No acute osseous pathology. IMPRESSION: Elevated left hemidiaphragm with left lung base atelectasis. Electronically Signed   By: Anner Crete M.D.   On: 07/18/2021 20:51   DG Abd Portable  1V  Result Date: 07/18/2021 CLINICAL DATA:  Feeding tube placement EXAM: PORTABLE ABDOMEN - 1 VIEW COMPARISON:  07/17/2021 FINDINGS: There is gaseous distention of stomach. Enteric tube is noted following the course 3rd to 4th portion duodenum. There is no significant small bowel dilation. Gas is present in colon. Degenerative changes are noted in lumbar spine. Left hemidiaphragm is elevated. IMPRESSION: There is no significant change in position of enteric tube with its tip in the region of 3rd to 4th portion of duodenum.There is gaseous distention of stomach. There is no significant small bowel dilation. Electronically Signed   By: Elmer Picker M.D.   On: 07/18/2021 13:23   ECHOCARDIOGRAM LIMITED  Result Date: 07/18/2021    ECHOCARDIOGRAM LIMITED REPORT   Patient Name:   ORLANDO DEVEREUX Date of Exam: 07/18/2021 Medical Rec #:  [id number]      Height:       74.0 in Accession #:    [id number]     Weight:       233.5 lb Date of Birth:  [birth year]      BSA:          2.321 m Patient Age:    73 years       BP:           114/74 mmHg Patient Gender: M              HR:           82 bpm. Exam Location:  Inpatient Procedure: Limited Echo Indications:     Cardiomyopathy  History:         Patient has prior history of Echocardiogram examinations, most                  recent 07/11/2021. CHF, CAD and Previous Myocardial Infarction,                  Prior CABG and MAZE procedure, Arrythmias:Atrial Fibrillation,                  Signs/Symptoms:CKD; Risk Factors:Diabetes, Hypertension and                  Obesity.  Sonographer:     Dustin Flock RDCS Referring Phys:  [id number] [name] Diagnosing Phys: Eleonore Chiquito MD IMPRESSIONS  1. Limited study. LVEF appears to have improved 45-50%. Global hypokinesis. RV function appears normal.  2. Left ventricular ejection fraction, by estimation, is 45 to 50%. The left ventricle has mildly decreased function. The left ventricle demonstrates global hypokinesis.  3. Right  ventricular systolic function is normal. The right ventricular size is normal.  4. The mitral valve is grossly normal. Comparison(s): Changes from prior study are noted. FINDINGS  Left Ventricle: Left ventricular ejection fraction, by estimation, is 45 to 50%. The left ventricle has mildly decreased  function. The left ventricle demonstrates global hypokinesis. The left ventricular internal cavity size was normal in size. There is  no left ventricular hypertrophy. Right Ventricle: The right ventricular size is normal. No increase in right ventricular wall thickness. Right ventricular systolic function is normal. Pericardium: There is no evidence of pericardial effusion. Mitral Valve: The mitral valve is grossly normal. Mild to moderate mitral annular calcification. LEFT VENTRICLE PLAX 2D LVIDd:         4.92 cm      Diastology LVIDs:         3.50 cm      LV e' medial:  7.18 cm/s LV PW:         1.06 cm      LV e' lateral: 5.87 cm/s LV IVS:        1.03 cm  LV Volumes (MOD) LV vol d, MOD A4C: 138.0 ml LV vol s, MOD A4C: 63.1 ml LV SV MOD A4C:     138.0 ml Eleonore Chiquito MD Electronically signed by Eleonore Chiquito MD Signature Date/Time: 07/18/2021/12:46:29 PM    Final (Updated)      Medications:     Scheduled Medications:  ALPRAZolam  1 mg Oral TID   amiodarone  200 mg Per Tube BID   Followed by   Derrill Memo ON 07/23/2021] amiodarone  200 mg Per Tube Daily   aspirin  81 mg Per Tube Daily   Or   aspirin EC  81 mg Oral Daily   atorvastatin  80 mg Per Tube Daily   B-complex with vitamin C  1 tablet Per Tube Daily   carvedilol  3.125 mg Oral BID WC   chlorhexidine  15 mL Mouth Rinse BID   Chlorhexidine Gluconate Cloth  6 each Topical Daily   Chlorhexidine Gluconate Cloth  6 each Topical Q0600   clopidogrel  75 mg Per Tube Daily   feeding supplement (PROSource TF)  45 mL Per Tube QID   heparin injection (subcutaneous)  5,000 Units Subcutaneous Q8H   hydrALAZINE  12.5 mg Oral TID   insulin aspart  3-9 Units  Subcutaneous Q4H   insulin aspart  6 Units Subcutaneous Q4H   insulin detemir  25 Units Subcutaneous BID   isosorbide dinitrate  10 mg Oral TID   mouth rinse  15 mL Mouth Rinse q12n4p   metoCLOPramide (REGLAN) injection  10 mg Intravenous Q6H   multivitamin with minerals  1 tablet Oral Daily   pantoprazole sodium  40 mg Per Tube Daily   sertraline  50 mg Oral Daily   sodium chloride flush  3 mL Intravenous Q12H   traZODone  100 mg Per Tube QHS    Infusions:  sodium chloride Stopped (07/13/21 0234)   albumin human 12.5 g (07/08/21 1827)   And   sodium chloride Stopped (07/08/21 2051)   lactated ringers Stopped (07/16/21 0848)   promethazine (PHENERGAN) injection (IM or IVPB) Stopped (07/09/21 1628)    PRN Medications: acetaminophen, albumin human **AND** sodium chloride, albuterol, docusate, metoprolol tartrate, ondansetron (ZOFRAN) IV, oxyCODONE, polyethylene glycol, promethazine (PHENERGAN) injection (IM or IVPB), sennosides, simethicone, sodium chloride flush    Patient Profile   Mr Vora is a 82 year with a history of visually impaired, DM, HTN, depression, anxiety takes 2 mg xanax 3 times a day, SVT, CKD Stage II, PAF, and CAD.Marland Kitchen   Anterior Stemi--> CABG--> cardiogenic shock.   Assessment/Plan   1. CAD: Admitted with anterolateral MI from occluded diagonal treated with DES.  Patient then had  CABG with LIMA-LAD, SVG-D, SVG-PDA, and left radial-OM for complete revascularization on 10/25   - Continue ASA 81. Will likely drop this at discharge given need for Plavix and anticoagulation.  - Continue Plavix 75 daily.  - atorvastatin 80 mg daily.  - Amlodipine for radial harvest held with hypotension on pressors.    - No s/s angina  2. Atrial fibrillation: Paroxysmal, s/p Maze and appendage clipping.  Having brief runs of atrial fibrillation but not sustained - Continue amiodarone 200 mg bid.  - Had heparin in CVVHD circuit. CVVHD now off. D/w D[employer]. [employer] Will  start low-dose heparin at 600u/hr. Can titrate if stable.  - Hgb 8.7 -> 8.2 today - In patients with surgical LA appendage closure, if unable to take anticoagulation long-term should get DOAC 3 months then assess for completeness of closure with TEE.  If able to take anticoagulation, the LAA closure is adjunctive.  3. AKI on CKD stage 3: Baseline creatinine around 1.6, up to 5 post-op.  He has likely had multiple hits including contrast with initial cath/PCI and possible peri-operative hypotension.  Suspect ATN is present. Not making any urine.Concerned about chances of renal recovery. Nephrology following. CVVHD stopped 11/4. Fir iHD today  4. Acute systolic CHF: Ischemic cardiomyopathy.  Echo prior to this admission with EF 50-55% (pre-MI and CABG).  Limited echo on 10/28 showed minimal pericardial effusion, EF in 25% range. Intraoperative TEE with EF 20%. Off vasopressin + NE + Off DBA . Co-ox stable off inotrope - Continue hydralazine 12.5 mg tid + isordil 10 tid. Will not push today as need BP for iHD - Continue  coreg 3.125 mg BID - Volume up. For iHD today 5. Anemia: Transfuse hgb < 8, had 1 unit 11/02 6. ID: Afebrile, off abx.  - Course of cefepime completed 7. Acute hypoxemic respiratory failure: Suspect primarily pulmonary edema, now intubated.  Possible PNA with bibasilar lung opacities. Extubated, completed course of cefepime, volume status now looks good. 02 stable on 3L [place] 8. Thrombocytopenia: Resolved.  9. Right sided weakness: Normal head CT 11/03  Glori Bickers 07/19/2021 9:28 AM

## 2021-07-19 NOTE — Progress Notes (Addendum)
Advance for heparin Indication: atrial fibrillation  No Known Allergies  Patient Measurements: Height: 6\' 2"  (188 cm) Weight: 108.9 kg (240 lb 1.3 oz) IBW/kg (Calculated) : 82.2 HEPARIN DW (KG): 106.6   Vital Signs: Temp: 98.3 F (36.8 C) (11/05 1608) Temp Source: Oral (11/05 1608) BP: 122/72 (11/05 1600) Pulse Rate: 81 (11/05 1600)  Labs: Recent Labs    07/17/21 0514 07/17/21 1537 07/18/21 0151 07/18/21 1600 07/19/21 0313 07/19/21 1438  HGB 8.4*  --  8.7*  --  8.2*  --   HCT 26.3*  --  27.0*  --  25.4*  --   PLT 273  --  267  --  319  --   HEPARINUNFRC  --   --   --   --   --  <0.10*  CREATININE 3.71*   < > 3.76* 3.61* 4.74*  --    < > = values in this interval not displayed.     Estimated Creatinine Clearance: 22.9 mL/min (A) (by C-G formula based on SCr of 4.74 mg/dL (H)).   Medical History: Past Medical History:  Diagnosis Date   Anxiety    Depression    Diabetic neuropathy ([place])    Diabetic retinopathy ([place])    visual impairment   Dysrhythmia    "palpatations sometimes" (06/30/2017)   Exertional dyspnea 06/30/2021   Family history of adverse reaction to anesthesia    sister had OR 2016; "couldn't wake up; could hear what they were saying but couldn't get their attention; like I was paralyzed but fully awake" (06/30/2017)   Pneumonia X 2   SVT (supraventricular tachycardia) ([place])    Toe ulcer ([place]) 06/30/2017   2nd digit   Type II diabetes mellitus ([place])     Medications:  Infusions:   sodium chloride Stopped (07/13/21 0234)   sodium chloride     sodium chloride     albumin human 12.5 g (07/08/21 1827)   And   sodium chloride Stopped (07/08/21 2051)   [START ON 07/21/2021]  ceFAZolin (ANCEF) IV     heparin 600 Units/hr (07/19/21 1300)   lactated ringers Stopped (07/16/21 0848)   promethazine (PHENERGAN) injection (IM or IVPB) Stopped (07/09/21 1628)    Assessment: 55 yo M s/p CABG, MAZE, and  atriaclip on 10/25. Has experienced intermittent post-op a fib treated with amiodarone and was previously on a therapeutic heparin gtt with goal of 0.3-0.5. Also noted to have post-op bleeding and received RBCs on 11/2.   Hemoglobin stable the last 3 days in the mid 8s and heparin was restarted 11/5.  -heparin level < 0.1  Goal of Therapy:  Fixed dose heparin per MD to start Monitor platelets by anticoagulation protocol: Yes   Plan:  -No titration of heparin until 11/6; continue  600 units/hr -Heparin level and CBC in am  Hildred Laser, PharmD Clinical Pharmacist **Pharmacist phone directory can now be found on [email or web address] ([email or web address]).  Listed under [place].

## 2021-07-19 NOTE — Consult Note (Signed)
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 VIEW COMPARISON:  07/17/2021 FINDINGS: There is gaseous distention of stomach. Enteric tube is noted following the course 3rd to 4th portion duodenum. There is no significant small bowel dilation. Gas is present in colon. Degenerative changes are noted in lumbar spine. Left hemidiaphragm is elevated. IMPRESSION: There is no significant change in position of enteric tube with its tip in the region of 3rd to 4th portion of duodenum.There is gaseous distention of stomach. There is no significant small bowel dilation. Electronically Signed   By: Elmer Picker M.D.   On: 07/18/2021  13:23   DG Abd Portable 1V  Result Date: 07/14/2021 CLINICAL DATA:  Feeding tube placement. EXAM: PORTABLE ABDOMEN - 1 VIEW COMPARISON:  Abdominal x-ray from same day at 1131 hours. FINDINGS: Interval advancement of the feeding tube with the tip  now at the junction of the third and fourth portions of the duodenum. Normal bowel gas pattern. IMPRESSION: 1. Interval feeding tube advancement with the tip now in the distal duodenum. Electronically Signed   By: Titus Dubin M.D.   On: 07/14/2021 13:20   DG Abd Portable 1V  Result Date: 07/14/2021 CLINICAL DATA:  Feeding tube placement EXAM: PORTABLE ABDOMEN - 1 VIEW COMPARISON:  07/12/2021 FINDINGS: Soft feeding tube enters the stomach in has its tip in the fundus. Regional gas pattern unremarkable. IMPRESSION: Soft feeding tube tip in the fundus of the stomach. Electronically Signed   By: Nelson Chimes M.D.   On: 07/14/2021 11:37   MYOCARDIAL PERFUSION IMAGING  Result Date: 07/07/2021   Findings are consistent with prior myocardial infarction. The study is high risk.   ST elevation in the lateral and anterior leads was noted.   LV perfusion is abnormal. Defect 1: There is a large defect with severe reduction in uptake present in the apical to mid anterior location(s) that is fixed. Consistent with infarction.   Prior study not available for comparison. Read as a rest only study. Immediately following stress testing due to ECG changes and symptoms, patient was evaluated by DOD and a Code STEMI was called.  Patient was transported to hospital via EMS for emergent cath. On rest images, there is lack of uptake in the mid to apical anterior wall. This is suggestive of infarction. ECG also shows anterolateral ST elevation after lexiscan, with progression to afib RVR. Results reported to Dr. Burt Knack, who has seen patient in ER.   ECHOCARDIOGRAM COMPLETE  Result Date: 06/25/2021    ECHOCARDIOGRAM REPORT   Patient Name:   Billy [name] Date of Exam: 06/25/2021 Medical Rec #:  [id number]      Height:       74.0 in Accession #:    [id number]     Weight:       253.0 lb Date of Birth:  11/10/1965      BSA:          2.402 m Patient Age:    68 years       BP:           142/93 mmHg Patient Gender: M               HR:           85 bpm. Exam Location:  Inpatient Procedure: 2D Echo, Cardiac Doppler and Color Doppler Indications:    R07.9* Chest pain, unspecified  History:        Patient has prior history of Echocardiogram examinations, most                 recent 01/30/2021. Risk Factors:Diabetes.  Sonographer:    Bernadene Person RDCS Referring Phys: [id number] [place]  1. Septal / inferior basal hypokinesis EF similar to estimated on TTE 01/30/21. Left ventricular ejection fraction, by estimation, is 50 to 55%. The left ventricle has low normal function. The left ventricle has no regional wall motion abnormalities. The  left ventricular internal cavity size was mildly dilated. Left ventricular diastolic parameters are consistent with Grade I diastolic dysfunction (impaired relaxation).  2. Right ventricular systolic function is normal. The  right ventricular size is normal. There is normal pulmonary artery systolic pressure.  3. The mitral valve is abnormal. No evidence of mitral valve regurgitation. No evidence of mitral stenosis. Moderate to severe mitral annular calcification.  4. The aortic valve is tricuspid. Aortic valve regurgitation is not visualized. Mild to moderate aortic valve sclerosis/calcification is present, without any evidence of aortic stenosis.  5. Aortic dilatation noted. There is mild dilatation of the aortic root, measuring 39 mm.  6. The inferior vena cava is normal in size with greater than 50% respiratory variability, suggesting right atrial pressure of 3 mmHg. FINDINGS  Left Ventricle: Septal / inferior basal hypokinesis EF similar to estimated on TTE 01/30/21. Left ventricular ejection fraction, by estimation, is 50 to 55%. The left ventricle has low normal function. The left ventricle has no regional wall motion abnormalities. The left ventricular internal cavity size was mildly dilated. There is no left ventricular hypertrophy. Left ventricular diastolic parameters are consistent  with Grade I diastolic dysfunction (impaired relaxation). Right Ventricle: The right ventricular size is normal. No increase in right ventricular wall thickness. Right ventricular systolic function is normal. There is normal pulmonary artery systolic pressure. The tricuspid regurgitant velocity is 2.20 m/s, and  with an assumed right atrial pressure of 3 mmHg, the estimated right ventricular systolic pressure is 47.6 mmHg. Left Atrium: Left atrial size was normal in size. Right Atrium: Right atrial size was normal in size. Pericardium: There is no evidence of pericardial effusion. Mitral Valve: The mitral valve is abnormal. There is mild thickening of the mitral valve leaflet(s). There is mild calcification of the mitral valve leaflet(s). Moderate to severe mitral annular calcification. No evidence of mitral valve regurgitation. No evidence of mitral valve stenosis. Tricuspid Valve: The tricuspid valve is normal in structure. Tricuspid valve regurgitation is trivial. No evidence of tricuspid stenosis. Aortic Valve: The aortic valve is tricuspid. Aortic valve regurgitation is not visualized. Mild to moderate aortic valve sclerosis/calcification is present, without any evidence of aortic stenosis. Pulmonic Valve: The pulmonic valve was normal in structure. Pulmonic valve regurgitation is not visualized. No evidence of pulmonic stenosis. Aorta: Aortic dilatation noted. There is mild dilatation of the aortic root, measuring 39 mm. Venous: The inferior vena cava is normal in size with greater than 50% respiratory variability, suggesting right atrial pressure of 3 mmHg. IAS/Shunts: No atrial level shunt detected by color flow Doppler.  LEFT VENTRICLE PLAX 2D LVIDd:         5.30 cm   Diastology LVIDs:         3.90 cm   LV e' medial:    4.05 cm/s LV PW:         0.90 cm   LV E/e' medial:  14.6 LV IVS:        0.90 cm   LV e' lateral:   6.30 cm/s LVOT diam:     2.20 cm   LV E/e' lateral: 9.4 LV SV:         65 LV SV Index:   27  LVOT Area:     3.80 cm  RIGHT VENTRICLE RV S prime:     13.90 cm/s TAPSE (M-mode): 2.3 cm LEFT ATRIUM             Index        RIGHT ATRIUM           Index LA diam:        3.70 cm 1.54 cm/m   RA Area:  18.80 cm LA Vol (A2C):   71.9 ml 29.94 ml/m  RA Volume:   52.40 ml  21.82 ml/m LA Vol (A4C):   76.6 ml 31.89 ml/m LA Biplane Vol: 75.2 ml 31.31 ml/m  AORTIC VALVE LVOT Vmax:   93.40 cm/s LVOT Vmean:  60.800 cm/s LVOT VTI:    0.171 m  AORTA Ao Root diam: 3.90 cm MITRAL VALVE               TRICUSPID VALVE MV Area (PHT): 2.00 cm    TR Peak grad:   19.4 mmHg MV Decel Time: 380 msec    TR Vmax:        220.00 cm/s MV E velocity: 59.10 cm/s MV A velocity: 99.80 cm/s  SHUNTS MV E/A ratio:  0.59        Systemic VTI:  0.17 m                            Systemic Diam: 2.20 cm Jenkins Rouge MD Electronically signed by Jenkins Rouge MD Signature Date/Time: 06/25/2021/2:09:33 PM    Final    ECHO INTRAOPERATIVE TEE  Result Date: 07/08/2021  *INTRAOPERATIVE TRANSESOPHAGEAL REPORT *  Patient Name:   Billy [name] Date of Exam: 07/08/2021 Medical Rec #:  [id number]      Height:       74.0 in Accession #:    [id number]     Weight:       255.1 lb Date of Birth:  1966-05-12      BSA:          2.41 m Patient Age:    82 years       BP:           143/86 mmHg Patient Gender: M              HR:           71 bpm. Exam Location:  Inpatient Transesophogeal exam was perform intraoperatively during surgical procedure. Patient was closely monitored under general anesthesia during the entirety of examination. Indications:     I25.110 Atherosclerotic heart disease of native coronary artery                  with unstable angina pectoris Performing Phys: [id number] Lucile Crater LIGHTFOOT Diagnosing Phys: Annye Asa MD Complications: No known complications during this procedure. POST-OP IMPRESSIONS limited post-CPB exam: The patient separated from CPB with Dobutamine assistance. _ Left Ventricle: The left ventricular function is essentially  unchanged unchanged from pre-bypass images. The ventricle has severely reduced systolic function, with global hypokinesis, and severe hypokinesis of the inferior wall. The overall ejection fraction appears 20%. There is slight improvement with time off of CPB, allowing discontinuation of the Dobutamine. _ Right Ventricle: The right ventricular function appears unchanged from pre-bypass images. The RV was slightly hypokinetic on initial separation from CPB, but this quickly normalized. _ Aortic Valve: The aortic valve function appears unchanged from pre-bypass images. _ Mitral Valve: The mitral valve function appears unchanged from pre-bypass images. _ Tricuspid Valve: The tricuspid valve function appears unchanged from pre-bypass. There is trivial TR. PRE-OP FINDINGS  Left Ventricle: The left ventricle has moderate-severely reduced systolic function, with an ejection fraction of 30-35%, measured 33%. The cavity size appears mildly dilated. Left ventricular diffuse hypokinesis, with Severe hypokinesis of the left ventricular, inferior wall and inferoseptal wall. There is no left ventricular hypertrophy. Left ventricular diastolic function was not evaluated. Right Ventricle:  The right ventricle has normal systolic function. The cavity size is normal. There is no increase in right ventricular wall thickness. Left Atrium: Mild Left atrial size was dilated. No left atrial/left atrial appendage thrombus was detected. Left atrial appendage velocity is normal at greater than 40 cm/s. Right Atrium: Right atrial size was normal in size. Catheter present in the right atrium. Interatrial Septum: No atrial level shunt detected by color flow Doppler. There is no evidence of a patent foramen ovale. Pericardium: A small pericardial effusion is present. The pericardial effusion is circumferential. Mitral Valve: The mitral valve is normal in structure. Mitral valve regurgitation is not visualized by color flow Doppler. There is no  evidence of mitral valve vegetation. There is no evidence of mitral stenosis, with peak gradient 3 mmHg, mean gradient 1 mmHg. Tricuspid Valve: The tricuspid valve was normal in structure. Tricuspid valve regurgitation is trivial by color flow Doppler. No evidence of tricuspid stenosis is present. There is no evidence of tricuspid valve vegetation. Aortic Valve: The aortic valve is tricuspid. Aortic valve regurgitation was not visualized by color flow Doppler. There is no stenosis of the aortic valve, with peak gradient 3 mmHg, mean gradient 2 mmHg. There is no evidence of aortic valve vegetation. Pulmonic Valve: The pulmonic valve was normal in structure, with normal leaflet mobility. No evidence of pulmonic stenosis. Pulmonic valve regurgitation is trivial by color flow Doppler. Aorta: The aortic root, ascending aorta and aortic arch appear normal in size and structure, although The aortic arch was not well visualized. Pulmonary Artery: The pulmonary artery is of normal size. Venous: The inferior vena cava is dilated in size with greater than 50% respiratory variability, suggesting right atrial pressure of 8 mmHg. Shunts: There is no evidence of an atrial septal defect. +-------------+--------++ AORTIC VALVE          +-------------+--------++ AV Mean Grad:2.0 mmHg +-------------+--------++  Annye Asa MD Electronically signed by Annye Asa MD Signature Date/Time: 07/08/2021/6:44:10 PM    Final    ECHOCARDIOGRAM LIMITED  Result Date: 07/18/2021    ECHOCARDIOGRAM LIMITED REPORT   Patient Name:   Billy [name] Date of Exam: 07/18/2021 Medical Rec #:  [id number]      Height:       74.0 in Accession #:    [id number]     Weight:       233.5 lb Date of Birth:  11/25/1965      BSA:          2.321 m Patient Age:    89 years       BP:           114/74 mmHg Patient Gender: M              HR:           82 bpm. Exam Location:  Inpatient Procedure: Limited Echo Indications:     Cardiomyopathy  History:          Patient has prior history of Echocardiogram examinations, most                  recent 07/11/2021. CHF, CAD and Previous Myocardial Infarction,                  Prior CABG and MAZE procedure, Arrythmias:Atrial Fibrillation,                  Signs/Symptoms:CKD; Risk Factors:Diabetes, Hypertension and  Obesity.  Sonographer:     Dustin Flock RDCS Referring Phys:  [id number] [name] Diagnosing Phys: Eleonore Chiquito MD IMPRESSIONS  1. Limited study. LVEF appears to have improved 45-50%. Global hypokinesis. RV function appears normal.  2. Left ventricular ejection fraction, by estimation, is 45 to 50%. The left ventricle has mildly decreased function. The left ventricle demonstrates global hypokinesis.  3. Right ventricular systolic function is normal. The right ventricular size is normal.  4. The mitral valve is grossly normal. Comparison(s): Changes from prior study are noted. FINDINGS  Left Ventricle: Left ventricular ejection fraction, by estimation, is 45 to 50%. The left ventricle has mildly decreased function. The left ventricle demonstrates global hypokinesis. The left ventricular internal cavity size was normal in size. There is  no left ventricular hypertrophy. Right Ventricle: The right ventricular size is normal. No increase in right ventricular wall thickness. Right ventricular systolic function is normal. Pericardium: There is no evidence of pericardial effusion. Mitral Valve: The mitral valve is grossly normal. Mild to moderate mitral annular calcification. LEFT VENTRICLE PLAX 2D LVIDd:         4.92 cm      Diastology LVIDs:         3.50 cm      LV e' medial:  7.18 cm/s LV PW:         1.06 cm      LV e' lateral: 5.87 cm/s LV IVS:        1.03 cm  LV Volumes (MOD) LV vol d, MOD A4C: 138.0 ml LV vol s, MOD A4C: 63.1 ml LV SV MOD A4C:     138.0 ml Eleonore Chiquito MD Electronically signed by Eleonore Chiquito MD Signature Date/Time: 07/18/2021/12:46:29 PM    Final (Updated)     ECHOCARDIOGRAM LIMITED  Result Date: 07/11/2021    ECHOCARDIOGRAM LIMITED REPORT   Patient Name:   Billy [name] Date of Exam: 07/11/2021 Medical Rec #:  [id number]      Height:       74.0 in Accession #:    [id number]     Weight:       267.4 lb Date of Birth:  1966/07/30      BSA:          2.459 m Patient Age:    70 years       BP:           109/55 mmHg Patient Gender: M              HR:           83 bpm. Exam Location:  Inpatient Procedure: Limited Echo and Intracardiac Opacification Agent Indications:    Evaluate EF at normal heart rate  History:        Patient has prior history of Echocardiogram examinations. CAD                 and Previous Myocardial Infarction, Prior CABG and Abnormal ECG,                 Arrythmias:SVT, Signs/Symptoms:Shortness of Breath and Chest                 Pain; Risk Factors:Hypertension and Diabetes. Cardiac shock.  Sonographer:    Merrie Roof RDCS Referring Phys: [place]  1. Left ventricular ejection fraction, by estimation, is 20 to 25%. The left ventricle has severely decreased function. The left ventricle demonstrates regional wall motion abnormalities (see scoring diagram/findings for description).  There is severe akinesis of the left ventricular, mid-apical anteroseptal wall and apical segment. FINDINGS  Left Ventricle: Left ventricular ejection fraction, by estimation, is 20 to 25%. The left ventricle has severely decreased function. The left ventricle demonstrates regional wall motion abnormalities. Severe akinesis of the left ventricular, mid-apical anteroseptal wall and apical segment. Definity contrast agent was given IV to delineate the left ventricular endocardial borders.  LV Volumes (MOD) LV vol d, MOD A2C: 51.2 ml LV vol d, MOD A4C: 96.2 ml LV vol s, MOD A2C: 31.3 ml LV vol s, MOD A4C: 49.5 ml LV SV MOD A2C:     19.9 ml LV SV MOD A4C:     96.2 ml LV SV MOD BP:      26.8 ml Mertie Moores MD Electronically signed by Mertie Moores MD  Signature Date/Time: 07/11/2021/6:02:39 PM    Final    ECHOCARDIOGRAM LIMITED  Result Date: 07/10/2021    ECHOCARDIOGRAM LIMITED REPORT   Patient Name:   Billy [name] Date of Exam: 07/10/2021 Medical Rec #:  [id number]      Height:       74.0 in Accession #:    [id number]     Weight:       263.2 lb Date of Birth:  09-18-1965      BSA:          2.443 m Patient Age:    9 years       BP:           120/58 mmHg Patient Gender: M              HR:           131 bpm. Exam Location:  Inpatient Procedure: Limited Echo, Cardiac Doppler and Color Doppler Indications:    I31.3 Pericardial effusion (noninflammatory)  History:        Patient has prior history of Echocardiogram examinations, most                 recent 06/25/2021. Previous Myocardial Infarction and CAD, Prior                 CABG and Abnormal ECG, Arrythmias:SVT, Signs/Symptoms:Shortness                 of Breath, Dyspnea and Chest Pain; Risk Factors:Hypertension and                 Diabetes. Cardiac shock.  Sonographer:    Roseanna Rainbow RDCS Referring Phys: [id number] RAKESH V ALVA  Sonographer Comments: Technically difficult study due to poor echo windows, patient is morbidly obese and echo performed with patient supine and on artificial respirator. Image acquisition challenging due to patient body habitus. Wound dressing in subcostal region. IMPRESSIONS  1. Left ventricular ejection fraction, by estimation, is 50 to 55% which may be related to tachycardia. The left ventricle has low normal function. The left ventricle has no regional wall motion abnormalities. There is mild concentric left ventricular hypertrophy.  2. Right ventricular systolic function is normal. The right ventricular size is normal.  3. The mitral valve is normal in structure. No evidence of mitral valve regurgitation. No evidence of mitral stenosis. Moderate mitral annular calcification.  4. The aortic valve was not well visualized. Aortic valve regurgitation is not visualized. Mild to moderate  aortic valve sclerosis/calcification is present, without any evidence of aortic stenosis.  5. Aortic dilatation noted. There is mild dilatation of the aortic root, measuring 40 mm.  6. The  inferior vena cava is normal in size with greater than 50% respiratory variability, suggesting right atrial pressure of 3 mmHg.  7. A trivial pericardial effusion is posterior to the left ventricle.  8. Consider repeat limited echo for LVF once HR slowed down. FINDINGS  Left Ventricle: Left ventricular ejection fraction, by estimation, is 55 to 60%. The left ventricle has normal function. The left ventricle has no regional wall motion abnormalities. The left ventricular internal cavity size was normal in size. There is  mild concentric left ventricular hypertrophy. Left ventricular diastolic function could not be evaluated. Right Ventricle: The right ventricular size is normal. No increase in right ventricular wall thickness. Right ventricular systolic function is normal. Left Atrium: Left atrial size was normal in size. Right Atrium: Right atrial size was normal in size. Pericardium: Trivial pericardial effusion is present. The pericardial effusion is posterior to the left ventricle. Mitral Valve: The mitral valve is normal in structure. There is mild thickening of the mitral valve leaflet(s). Moderate mitral annular calcification. No evidence of mitral valve stenosis. Tricuspid Valve: The tricuspid valve is normal in structure. Tricuspid valve regurgitation is not demonstrated. No evidence of tricuspid stenosis. Aortic Valve: The aortic valve was not well visualized. Aortic valve regurgitation is not visualized. Mild to moderate aortic valve sclerosis/calcification is present, without any evidence of aortic stenosis. Pulmonic Valve: The pulmonic valve was normal in structure. Pulmonic valve regurgitation is not visualized. No evidence of pulmonic stenosis. Aorta: The aortic root is normal in size and structure and aortic  dilatation noted. There is mild dilatation of the aortic root, measuring 40 mm. Venous: The inferior vena cava is normal in size with greater than 50% respiratory variability, suggesting right atrial pressure of 3 mmHg. IAS/Shunts: No atrial level shunt detected by color flow Doppler. LEFT VENTRICLE PLAX 2D LVIDd:         5.20 cm LVIDs:         3.80 cm LV PW:         1.45 cm LV IVS:        1.25 cm  LEFT ATRIUM         Index LA diam:    3.50 cm 1.43 cm/m   AORTA Ao Root diam: 4.00 cm Fransico Him MD Electronically signed by Fransico Him MD Signature Date/Time: 07/10/2021/10:02:47 AM    Final     Labs:  CBC: Recent Labs    07/16/21 0415 07/16/21 1330 07/16/21 1625 07/17/21 0514 07/18/21 0151 07/19/21 0313  WBC 12.3*  --   --  15.2* 13.8* 15.7*  HGB 7.1*   < > 8.4* 8.4* 8.7* 8.2*  HCT 22.6*   < > 25.4* 26.3* 27.0* 25.4*  PLT 222  --   --  273 267 319   < > = values in this interval not displayed.    COAGS: Recent Labs    07/07/21 1353 07/08/21 1743 07/10/21 0937  INR 0.9 1.3* 1.2  APTT 24 33  --     BMP: Recent Labs    07/17/21 1537 07/18/21 0151 07/18/21 1600 07/19/21 0313  NA 138 138 136 135  K 4.4 4.5 4.3 4.2  CL 103 105 103 102  CO2 26 27 27 25   GLUCOSE 241* 167* 135* 191*  BUN 37* 39* 37* 53*  CALCIUM 7.9* 7.8* 7.8* 8.2*  CREATININE 3.66* 3.76* 3.61* 4.74*  GFRNONAA 19* 18* 19* 14*    LIVER FUNCTION TESTS: Recent Labs    07/07/21 1353 07/10/21 0937 07/10/21 1555 07/17/21  1537 07/18/21 0151 07/18/21 1600 07/19/21 0313  BILITOT 1.0 0.6  --   --   --   --   --   AST 113* 29  --   --   --   --   --   ALT 24 5  --   --   --   --   --   ALKPHOS 38 40  --   --   --   --   --   PROT 6.1* 5.2*  --   --   --   --   --   ALBUMIN 3.3* 2.3*   < > 2.0* 2.1* 2.2* 2.3*   < > = values in this interval not displayed.    TUMOR MARKERS: No results for input(s): AFPTM, CEA, CA199, CHROMGRNA in the last 8760 hours.  Assessment and Plan:  Severe acute kidney  injury; Hemodialysis: [name], 55 year old male, is tentatively scheduled 07/21/21 for an image-guided tunneled dialysis catheter with removal of the temporary catheter.  Risks and benefits discussed with the patient including, but not limited to bleeding, infection, vascular injury, pneumothorax which may require chest tube placement, air embolism or even death  All of the patient's questions were answered, patient is agreeable to proceed. He will be NPO at midnight.   Consent signed and in chart.   Thank you for this interesting consult.  I greatly enjoyed meeting BLAIRE HODSDON and look forward to participating in their care.  A copy of this report was sent to the requesting provider on this date.  Electronically Signed: Soyla Dryer, AGACNP-BC [phone] 07/19/2021, 9:16 AM   I spent a total of 20 Minutes    in face to face in clinical consultation, greater than 50% of which was counseling/coordinating care for tunneled dialysis catheter.

## 2021-07-19 NOTE — Progress Notes (Signed)
RN walked into patients room because he was sitting up trying to exit the bed. RN asked the patient if he needed anything and the pt told the RN "leave my room I don't need you". Jerral Bonito, RN was at bedside and witnessed patient refusing to be helped to be repositioned in the bed. RN made patient aware of his risk for falling if he does get out of the bed. Bed alarm is ON and fall mats are in place.

## 2021-07-19 NOTE — Progress Notes (Signed)
ANTICOAGULATION CONSULT NOTE - Initial Consult  Pharmacy Consult for heparin Indication: atrial fibrillation  No Known Allergies  Patient Measurements: Height: 6\' 2"  (188 cm) Weight: 108.9 kg (240 lb 1.3 oz) IBW/kg (Calculated) : 82.2  Vital Signs: Temp: 98.7 F (37.1 C) (11/05 0800) Temp Source: Oral (11/05 0800) BP: 141/77 (11/05 1000) Pulse Rate: 88 (11/05 1000)  Labs: Recent Labs    07/17/21 0514 07/17/21 1537 07/18/21 0151 07/18/21 1600 07/19/21 0313  HGB 8.4*  --  8.7*  --  8.2*  HCT 26.3*  --  27.0*  --  25.4*  PLT 273  --  267  --  319  CREATININE 3.71*   < > 3.76* 3.61* 4.74*   < > = values in this interval not displayed.    Estimated Creatinine Clearance: 22.9 mL/min (A) (by C-G formula based on SCr of 4.74 mg/dL (H)).   Medical History: Past Medical History:  Diagnosis Date   Anxiety    Depression    Diabetic neuropathy ([place])    Diabetic retinopathy ([place])    visual impairment   Dysrhythmia    "palpatations sometimes" (06/30/2017)   Exertional dyspnea 06/30/2021   Family history of adverse reaction to anesthesia    sister had OR 2016; "couldn't wake up; could hear what they were saying but couldn't get their attention; like I was paralyzed but fully awake" (06/30/2017)   Pneumonia X 2   SVT (supraventricular tachycardia) ([place])    Toe ulcer ([place]) 06/30/2017   2nd digit   Type II diabetes mellitus ([place])     Medications:  Infusions:   sodium chloride Stopped (07/13/21 0234)   albumin human 12.5 g (07/08/21 1827)   And   sodium chloride Stopped (07/08/21 2051)   heparin     lactated ringers Stopped (07/16/21 0848)   promethazine (PHENERGAN) injection (IM or IVPB) Stopped (07/09/21 1628)    Assessment: 55 yo M s/p CABG, MAZE, and atriaclip on 10/25. Has experienced intermittent post-op a fib treated with amiodarone and was previously on a therapeutic heparin gtt with goal of 0.3-0.5. Also noted to have post-op bleeding and received RBCs on  11/2.   Hemoglobin stable the last 3 days in the mid 8s. Platelets 260-320. No current bleeding noted.   Goal of Therapy:  Fixed dose heparin per MD to start Monitor platelets by anticoagulation protocol: Yes   Plan:  Start heparin infusion at 600 units/hr Daily HL and CBC while on heparin Follow-up with MD 11/6 on further titration of heparin gtt  Anderson Malta A Gladys Gutman 07/19/2021,10:19 AM

## 2021-07-19 NOTE — Progress Notes (Signed)
      [address]       [place] [address]             [phone]      Resting comfortably  Was able to sit up some today  BP 102/72   Pulse 81   Temp 98.3 F (36.8 C) (Oral)   Resp (!) 31   Ht 6\' 2"  (1.88 m)   Wt 108.9 kg   SpO2 94%   BMI 30.82 kg/m   Intake/Output Summary (Last 24 hours) at 07/19/2021 1813 Last data filed at 07/19/2021 1300 Gross per 24 hour  Intake 786.16 ml  Output 50 ml  Net 736.16 ml   PO intake minimal- monitor  Takyah Ciaramitaro C. Roxan Hockey, MD [facility] [phone]

## 2021-07-19 NOTE — Progress Notes (Signed)
RN called family member [name]) because patient is continuing to be agitated. [name] was able to calm the patient down enough for the RN to help the patient back in the bed.

## 2021-07-20 ENCOUNTER — Inpatient Hospital Stay (HOSPITAL_COMMUNITY): Payer: Medicare HMO

## 2021-07-20 DIAGNOSIS — Z951 Presence of aortocoronary bypass graft: Secondary | ICD-10-CM | POA: Diagnosis not present

## 2021-07-20 DIAGNOSIS — I5043 Acute on chronic combined systolic (congestive) and diastolic (congestive) heart failure: Secondary | ICD-10-CM | POA: Diagnosis not present

## 2021-07-20 DIAGNOSIS — R570 Cardiogenic shock: Secondary | ICD-10-CM | POA: Diagnosis not present

## 2021-07-20 DIAGNOSIS — J9601 Acute respiratory failure with hypoxia: Secondary | ICD-10-CM | POA: Diagnosis not present

## 2021-07-20 DIAGNOSIS — N179 Acute kidney failure, unspecified: Secondary | ICD-10-CM | POA: Diagnosis not present

## 2021-07-20 LAB — FERRITIN: Ferritin: 541 ng/mL — ABNORMAL HIGH (ref 24–336)

## 2021-07-20 LAB — RENAL FUNCTION PANEL
Albumin: 2.4 g/dL — ABNORMAL LOW (ref 3.5–5.0)
Anion gap: 11 (ref 5–15)
BUN: 77 mg/dL — ABNORMAL HIGH (ref 6–20)
CO2: 21 mmol/L — ABNORMAL LOW (ref 22–32)
Calcium: 8.2 mg/dL — ABNORMAL LOW (ref 8.9–10.3)
Chloride: 103 mmol/L (ref 98–111)
Creatinine, Ser: 6.36 mg/dL — ABNORMAL HIGH (ref 0.61–1.24)
GFR, Estimated: 10 mL/min — ABNORMAL LOW (ref >60)
Glucose, Bld: 110 mg/dL — ABNORMAL HIGH (ref 70–99)
Phosphorus: 6.4 mg/dL — ABNORMAL HIGH (ref 2.5–4.6)
Potassium: 4.4 mmol/L (ref 3.5–5.1)
Sodium: 135 mmol/L (ref 135–145)

## 2021-07-20 LAB — CBC
HCT: 26.2 % — ABNORMAL LOW (ref 39.0–52.0)
Hemoglobin: 8.3 g/dL — ABNORMAL LOW (ref 13.0–17.0)
MCH: 30.9 pg (ref 26.0–34.0)
MCHC: 31.7 g/dL (ref 30.0–36.0)
MCV: 97.4 fL (ref 80.0–100.0)
Platelets: 398 10*3/uL (ref 150–400)
RBC: 2.69 MIL/uL — ABNORMAL LOW (ref 4.22–5.81)
RDW: 13.2 % (ref 11.5–15.5)
WBC: 14.5 10*3/uL — ABNORMAL HIGH (ref 4.0–10.5)
nRBC: 0 % (ref 0.0–0.2)

## 2021-07-20 LAB — COOXEMETRY PANEL
Carboxyhemoglobin: 2.1 % — ABNORMAL HIGH (ref 0.5–1.5)
Methemoglobin: 0.7 % (ref 0.0–1.5)
O2 Saturation: 70.1 %
Total hemoglobin: 7.3 g/dL — ABNORMAL LOW (ref 12.0–16.0)

## 2021-07-20 LAB — GLUCOSE, CAPILLARY
Glucose-Capillary: 102 mg/dL — ABNORMAL HIGH (ref 70–99)
Glucose-Capillary: 103 mg/dL — ABNORMAL HIGH (ref 70–99)
Glucose-Capillary: 106 mg/dL — ABNORMAL HIGH (ref 70–99)
Glucose-Capillary: 118 mg/dL — ABNORMAL HIGH (ref 70–99)
Glucose-Capillary: 166 mg/dL — ABNORMAL HIGH (ref 70–99)
Glucose-Capillary: 181 mg/dL — ABNORMAL HIGH (ref 70–99)
Glucose-Capillary: 78 mg/dL (ref 70–99)

## 2021-07-20 LAB — HEPARIN LEVEL (UNFRACTIONATED): Heparin Unfractionated: <0.1 IU/mL — ABNORMAL LOW (ref 0.30–0.70)

## 2021-07-20 LAB — HEPATITIS B SURFACE ANTIBODY, QUANTITATIVE: Hep B S AB Quant (Post): <3.1 m[IU]/mL — ABNORMAL LOW (ref >9.9)

## 2021-07-20 LAB — IRON AND TIBC
Iron: 29 ug/dL — ABNORMAL LOW (ref 45–182)
Saturation Ratios: 12 % — ABNORMAL LOW (ref 17.9–39.5)
TIBC: 244 ug/dL — ABNORMAL LOW (ref 250–450)
UIBC: 215 ug/dL

## 2021-07-20 LAB — MAGNESIUM: Magnesium: 3 mg/dL — ABNORMAL HIGH (ref 1.7–2.4)

## 2021-07-20 NOTE — Progress Notes (Signed)
Patient ID: Billy [name], male   DOB: 05/17/1966, 55 y.o.   MRN: [id number]     Advanced Heart Failure Rounding Note  PCP-Cardiologist: None   Subjective:    10/25: S/P CABG x4 with MAZE . Intra Op EF 20%.  10/27: Intubated. Hypotensive. Placed on Norepi + Vaso. Recurrent A fib with amio increased to 60 mg/hr.  10/29: Co-ox low, DBA added 11/1: Extubated. AF with RVR, amio gtt restarted 11/03: Dobutamine off  Off NE and DBA, Co-ox 66% -> 54% -> 70%  CVVHD stopped 10/4. Weight up 13 pounds (?). Had about 700cc of urine out. For HD today (got pushed back from yesterday)  No further AF overnight. '  On low-dose heparin. No bleeding. Hgb stable 8.3  Denies SOB, orthopnea or PND.   Objective:   Weight Range: 111.8 kg Body mass index is 31.65 kg/m.   Vital Signs:   Temp:  [98.3 F (36.8 C)-98.5 F (36.9 C)] 98.5 F (36.9 C) (11/06 0700) Pulse Rate:  [77-88] 79 (11/06 1000) Resp:  [12-32] 22 (11/06 1000) BP: (88-130)/(48-76) 122/65 (11/06 1000) SpO2:  [91 %-100 %] 98 % (11/06 1000) Weight:  [111.8 kg] 111.8 kg (11/06 0700) Last BM Date: 07/20/21  Weight change: Filed Weights   07/18/21 0753 07/19/21 0500 07/20/21 0700  Weight: 105.9 kg 108.9 kg 111.8 kg    Intake/Output:   Intake/Output Summary (Last 24 hours) at 07/20/2021 1037 Last data filed at 07/20/2021 1000 Gross per 24 hour  Intake 611.57 ml  Output 750 ml  Net -138.43 ml       Physical Exam   General:  Well appearing. No resp difficulty HEENT: normal Neck: supple. no JVD. Carotids 2+ bilat; no bruits. No lymphadenopathy or thryomegaly appreciated. Cor: PMI nondisplaced. Regular rate & rhythm. No rubs, gallops or murmurs. Lungs: clear Abdomen: soft, nontender, nondistended. No hepatosplenomegaly. No bruits or masses. Good bowel sounds. Extremities: no cyanosis, clubbing, rash, edema Neuro: alert & orientedx3, cranial nerves grossly intact. moves all 4 extremities w/o difficulty. Affect  pleasant  Telemetry   NSR 80s No AF Personally reviewed  Labs    CBC Recent Labs    07/19/21 0313 07/20/21 0209  WBC 15.7* 14.5*  HGB 8.2* 8.3*  HCT 25.4* 26.2*  MCV 97.3 97.4  PLT 319 478    Basic Metabolic Panel Recent Labs    07/19/21 0313 07/20/21 0209  NA 135 135  K 4.2 4.4  CL 102 103  CO2 25 21*  GLUCOSE 191* 110*  BUN 53* 77*  CREATININE 4.74* 6.36*  CALCIUM 8.2* 8.2*  MG 2.9* 3.0*  PHOS 4.0 6.4*    Liver Function Tests Recent Labs    07/19/21 0313 07/20/21 0209  ALBUMIN 2.3* 2.4*    No results for input(s): LIPASE, AMYLASE in the last 72 hours. Cardiac Enzymes No results for input(s): CKTOTAL, CKMB, CKMBINDEX, TROPONINI in the last 72 hours.  BNP: BNP (last 3 results) No results for input(s): BNP in the last 8760 hours.  ProBNP (last 3 results) No results for input(s): PROBNP in the last 8760 hours.   D-Dimer No results for input(s): DDIMER in the last 72 hours. Hemoglobin A1C No results for input(s): HGBA1C in the last 72 hours.  Fasting Lipid Panel No results for input(s): CHOL, HDL, LDLCALC, TRIG, CHOLHDL, LDLDIRECT in the last 72 hours.   Thyroid Function Tests No results for input(s): TSH, T4TOTAL, T3FREE, THYROIDAB in the last 72 hours.  Invalid input(s): FREET3  Other results:  Imaging    DG Abd 1 View  Result Date: 07/20/2021 CLINICAL DATA:  Follow-up ileus. EXAM: ABDOMEN - 1 VIEW COMPARISON:  07/18/2021 FINDINGS: Previously seen feeding tube is no longer visualized. Gaseous distension stomach is noted. No evidence of dilated bowel loops. No definite radiopaque urinary calculi identified. IMPRESSION: Gaseous distension of stomach.  No evidence of dilated bowel loops. Electronically Signed   By: Marlaine Hind M.D.   On: 07/20/2021 09:47   DG Chest Port 1 View  Result Date: 07/20/2021 CLINICAL DATA:  Status post CABG EXAM: PORTABLE CHEST 1 VIEW COMPARISON:  07/18/2021 chest radiograph. FINDINGS: Intact sternotomy wires.  Left internal jugular central venous catheter terminates over the left brachiocephalic vein and just to the right of midline, unchanged. Stable cardiomediastinal silhouette with borderline mild cardiomegaly. No pneumothorax. Possible trace left pleural effusion. No right pleural effusion. No overt pulmonary edema. Stable mild elevation of the left hemidiaphragm with left basilar atelectasis. IMPRESSION: 1. Stable borderline mild cardiomegaly without overt pulmonary edema. 2. Possible trace left pleural effusion. 3. Stable mild elevation of the left hemidiaphragm with left basilar atelectasis. Electronically Signed   By: Ilona Sorrel M.D.   On: 07/20/2021 07:14     Medications:     Scheduled Medications:  ALPRAZolam  1 mg Oral TID   amiodarone  200 mg Oral BID   Followed by   Derrill Memo ON 07/23/2021] amiodarone  200 mg Oral Daily   aspirin  81 mg Oral Daily   Or   aspirin EC  81 mg Oral Daily   atorvastatin  80 mg Oral Daily   B-complex with vitamin C  1 tablet Oral Daily   carvedilol  3.125 mg Oral BID WC   chlorhexidine  15 mL Mouth Rinse BID   Chlorhexidine Gluconate Cloth  6 each Topical Daily   Chlorhexidine Gluconate Cloth  6 each Topical Q0600   clopidogrel  75 mg Oral Daily   feeding supplement (PROSource TF)  45 mL Per Tube QID   hydrALAZINE  12.5 mg Oral TID   insulin aspart  3-9 Units Subcutaneous Q4H   insulin aspart  6 Units Subcutaneous Q4H   insulin detemir  25 Units Subcutaneous BID   isosorbide dinitrate  10 mg Oral TID   mouth rinse  15 mL Mouth Rinse q12n4p   metoCLOPramide (REGLAN) injection  10 mg Intravenous Q6H   multivitamin with minerals  1 tablet Oral Daily   pantoprazole  40 mg Oral Daily   sertraline  50 mg Oral Daily   sodium chloride flush  3 mL Intravenous Q12H   traZODone  100 mg Oral QHS    Infusions:  sodium chloride Stopped (07/13/21 0234)   sodium chloride     sodium chloride     albumin human 12.5 g (07/08/21 1827)   And   sodium chloride  Stopped (07/08/21 2051)   [START ON 07/21/2021]  ceFAZolin (ANCEF) IV     heparin 600 Units/hr (07/20/21 1000)   lactated ringers Stopped (07/16/21 0848)   promethazine (PHENERGAN) injection (IM or IVPB) Stopped (07/09/21 1628)    PRN Medications: sodium chloride, sodium chloride, acetaminophen, albumin human **AND** sodium chloride, albuterol, alteplase, docusate, heparin, lidocaine (PF), lidocaine-prilocaine, metoprolol tartrate, ondansetron (ZOFRAN) IV, oxyCODONE, pentafluoroprop-tetrafluoroeth, polyethylene glycol, promethazine (PHENERGAN) injection (IM or IVPB), sennosides, simethicone, sodium chloride flush    Patient Profile   Mr Kolander is a 42 year with a history of visually impaired, DM, HTN, depression, anxiety takes 2 mg xanax 3 times a day,  SVT, CKD Stage II, PAF, and CAD.Marland Kitchen   Anterior Stemi--> CABG--> cardiogenic shock.   Assessment/Plan   1. CAD: Admitted with anterolateral MI from occluded diagonal treated with DES.  Patient then had CABG with LIMA-LAD, SVG-D, SVG-PDA, and left radial-OM for complete revascularization on 10/25   - Continue ASA 81. Will likely drop this at discharge given need for Plavix and anticoagulation.  - Continue Plavix 75 daily.  - atorvastatin 80 mg daily.  - Amlodipine for radial harvest held with hypotension on pressors.    - No s/s angina 2. Atrial fibrillation: Paroxysmal, s/p Maze and appendage clipping.  Having brief runs of atrial fibrillation but not sustained - Continue amiodarone 200 mg bid.  - Tolerating low-dose heparin @ 600 u/hr. No bleeding. HL < 0.1. Will not titrate today. Consider DOAC once tunneled HD cath placed. - Hgb 8.7 -> 8.2 -> 8.3 today - In patients with surgical LA appendage closure, if unable to take anticoagulation long-term should get DOAC 3 months then assess for completeness of closure with TEE.  If able to take anticoagulation, the LAA closure is adjunctive.  3. AKI on CKD stage 3: Baseline creatinine around 1.6,  up to 5 post-op.  He has likely had multiple hits including contrast with initial cath/PCI and possible peri-operative hypotension.  Suspect ATN. Concerned about chances of renal recovery. Made 700 cc urine overnight, Nephrology following. CVVHD stopped 11/4 - For HD today. Tunneled cath ordered  4. Acute systolic CHF: Ischemic cardiomyopathy.  Echo prior to this admission with EF 50-55% (pre-MI and CABG).  Limited echo on 10/28 showed minimal pericardial effusion, EF in 25% range. Intraoperative TEE with EF 20%. Off vasopressin + NE + Off DBA . Co-ox stable off inotrope - Continue hydralazine 12.5 mg tid + isordil 10 tid. Will not push today as need BP for iHD - Continue  coreg 3.125 mg BID - No ARN/SGLT2 with AKI - Volume up. For iHD today 5. Anemia: Transfuse hgb < 8, had 1 unit 11/02 6. ID: Afebrile, off abx. WBC stable 14-15k range - Course of cefepime completed 7. Acute hypoxemic respiratory failure: Suspect primarily pulmonary edema, now intubated.  Possible PNA with bibasilar lung opacities. Extubated, completed course of cefepime, volume status now looks good. 02 stable on 3L [place] 8. Thrombocytopenia: Resolved.  9. Right sided weakness: Normal head CT 11/03  If tolerates iHD likely can go to floor tomorrow   Glori Bickers MD 07/20/2021 10:37 AM

## 2021-07-20 NOTE — Progress Notes (Signed)
   NAME:  Billy [name], MRN:  [id number], DOB:  26-Jul-1966, LOS: 35 ADMISSION DATE:  07/07/2021, CONSULTATION DATE:  07/20/2021  REFERRING MD:  Billy [name], CHIEF COMPLAINT:  cardiogenic shock , on bipap   History of Present Illness:  55 year old diabetic, hypertensive admitted 10/24 with STEMI.  He had recent admission 10/11 for chest pain and hypertensive urgency with normal LVEF on echo.  He underwent left heart cath which showed three-vessel disease, underwent PCI with DES to left circumflex followed by CABG x4 with radial harvest and maze procedure.  Intra-Op EF was noted to be 20%.  He was extubated postop day 1 but developed progressive shock requiring Levophed and vasopressin with AKI and oliguria.  He was placed on BiPAP for hypoxia, ABG showed metabolic acidosis for which she was given 3 amps of bicarb and PCCM consulted Seen by heart failure service, coox 56% started on milrinone  Pertinent  Medical History  Diabetes Hypertension CKD stage II -Baseline creatinine 1.8 Paroxysmal atrial fibrillation Anxiety -on Xanax 2 mg thrice daily  Significant Hospital Events:   10/24 admitted with STEMI  10/27 PCCM consulted, due to respiratory issues intubated early morning   10/28 No acute events overnight, flipped back into NSR with less pressor requirement and decreased FIO2 this AM 10/30 increasing FiO2 again as fluid removal decreased.  Copious secretions. 11/1 extubated 11/2 off dobutamine.   Interim History / Subjective:  No events. Patients states he is feeling well. Occasional burping but no N/V.  Objective   Blood pressure 126/71, pulse 80, temperature 98.5 F (36.9 C), temperature source Oral, resp. rate (!) 22, height 6\' 2"  (1.88 m), weight 108.9 kg, SpO2 100 %.        Intake/Output Summary (Last 24 hours) at 07/20/2021 0732 Last data filed at 07/20/2021 0700 Gross per 24 hour  Intake 533.57 ml  Output 100 ml  Net 433.57 ml    Filed Weights   07/18/21 0630  07/18/21 0753 07/19/21 0500  Weight: 106.9 kg 105.9 kg 108.9 kg    Examination: No distress Abdomen slightly distended but still soft Rectal tube with clear appearing liquid Respiratory status stable with poor inspiratory effort Ext with trace edema Moves all 4 ext to command  Ancillary tests  His left hemidiaphragm elevation is chronic  Assessment & Plan:   Post 3 vessel CABG complicated by shock, renal failure, neuromuscular weakness, ileus- overall improving, off inotropes.  Cortrak is really bothering him and contributing to agitation, taking PO, making stool.  - Continue reglan - Push mobility - iHD and tunneled line per nephrology - Encourage IS - Basal bolus insulin as ordered - Encourage PO, if he does not take enough would consider regular NGT so we can suction PRN - Probably will keep in ICU until PO access figured out and Billy [name] is back on service  Billy Emery MD PCCM

## 2021-07-20 NOTE — Progress Notes (Signed)
      [address]       [place] [address]             [phone]      Just finished HD Feels well + flatus but no BM, continues to have some belching  BP 119/62 (BP Location: Right Arm)   Pulse 79   Temp 98.3 F (36.8 C) (Oral)   Resp 20   Ht 6\' 2"  (1.88 m)   Wt 111.8 kg   SpO2 98%   BMI 31.65 kg/m   Intake/Output Summary (Last 24 hours) at 07/20/2021 1739 Last data filed at 07/20/2021 1731 Gross per 24 hour  Intake 407.41 ml  Output 2200 ml  Net -1792.59 ml   Remo Lipps C. Roxan Hockey, MD [facility] [phone]

## 2021-07-20 NOTE — Progress Notes (Signed)
[place] for heparin Indication: atrial fibrillation  No Known Allergies  Patient Measurements: Height: 6\' 2"  (188 cm) Weight: 111.8 kg (246 lb 7.6 oz) IBW/kg (Calculated) : 82.2 HEPARIN DW (KG): 106.6   Vital Signs: Temp: 98.5 F (36.9 C) (11/06 0700) Temp Source: Oral (11/06 0700) BP: 117/63 (11/06 0910) Pulse Rate: 82 (11/06 0910)  Labs: Recent Labs    07/18/21 0151 07/18/21 1600 07/19/21 0313 07/19/21 1438 07/20/21 0209  HGB 8.7*  --  8.2*  --  8.3*  HCT 27.0*  --  25.4*  --  26.2*  PLT 267  --  319  --  398  HEPARINUNFRC  --   --   --  <0.10* <0.10*  CREATININE 3.76* 3.61* 4.74*  --  6.36*     Estimated Creatinine Clearance: 17.2 mL/min (A) (by C-G formula based on SCr of 6.36 mg/dL (H)).   Medical History: Past Medical History:  Diagnosis Date   Anxiety    Depression    Diabetic neuropathy ([place])    Diabetic retinopathy ([place])    visual impairment   Dysrhythmia    "palpatations sometimes" (06/30/2017)   Exertional dyspnea 06/30/2021   Family history of adverse reaction to anesthesia    sister had OR 2016; "couldn't wake up; could hear what they were saying but couldn't get their attention; like I was paralyzed but fully awake" (06/30/2017)   Pneumonia X 2   SVT (supraventricular tachycardia) ([place])    Toe ulcer ([place]) 06/30/2017   2nd digit   Type II diabetes mellitus ([place])     Medications:  Infusions:   sodium chloride Stopped (07/13/21 0234)   sodium chloride     sodium chloride     albumin human 12.5 g (07/08/21 1827)   And   sodium chloride Stopped (07/08/21 2051)   [START ON 07/21/2021]  ceFAZolin (ANCEF) IV     heparin 600 Units/hr (07/20/21 0800)   lactated ringers Stopped (07/16/21 0848)   promethazine (PHENERGAN) injection (IM or IVPB) Stopped (07/09/21 1628)    Assessment: 55 yo M s/p CABG, MAZE, and atriaclip on 10/25. Has experienced intermittent post-op a fib treated with amiodarone and was  previously on a therapeutic heparin gtt with goal of 0.3-0.5. Also noted to have post-op bleeding and received RBCs on 11/2.   Hemoglobin and plts stable the last 3 days in the mid 8s and 300s respectively. Heparin was restarted 11/5 at fixed dose of 600 units/hr.  -heparin level < 0.1  Goal of Therapy:  Fixed dose heparin per MD Monitor platelets by anticoagulation protocol: Yes   Plan:  -No titration of heparin until 11/7; continue  600 units/hr -Heparin level and CBC in am -F/u titration with MD on 11/7  Cathrine Muster, PharmD PGY2 Cardiology Pharmacy Resident Phone: [phone] 07/20/2021  9:32 AM  Please check [email or web address] for unit-specific pharmacy phone numbers.

## 2021-07-20 NOTE — Progress Notes (Signed)
[facility] Progress Note   Subjective: Patient continues to feel well today.  Possibly slightly increased urine output over the past 24 hours.  No other significant complaints  Objective Vitals:   07/20/21 0400 07/20/21 0500 07/20/21 0600 07/20/21 0700  BP: (!) 100/50 121/72 128/75 126/71  Pulse: 81 83 85 80  Resp: 12 (!) 28 17 (!) 22  Temp:    98.5 F (36.9 C)  TempSrc:    Oral  SpO2: 95% 97% 96% 100%  Weight:    111.8 kg  Height:       Physical Exam General: Awake, alert, lying in bed Heart: normal rate Lungs: Bilateral chest rise, no increased work of breathing Abdomen: soft, nontender Extremities: trace edema  Additional Objective Labs: Basic Metabolic Panel: Recent Labs  Lab 07/18/21 1600 07/19/21 0313 07/20/21 0209  NA 136 135 135  K 4.3 4.2 4.4  CL 103 102 103  CO2 27 25 21*  GLUCOSE 135* 191* 110*  BUN 37* 53* 77*  CREATININE 3.61* 4.74* 6.36*  CALCIUM 7.8* 8.2* 8.2*  PHOS 2.9 4.0 6.4*   Liver Function Tests: Recent Labs  Lab 07/18/21 1600 07/19/21 0313 07/20/21 0209  ALBUMIN 2.2* 2.3* 2.4*   No results for input(s): LIPASE, AMYLASE in the last 168 hours. CBC: Recent Labs  Lab 07/16/21 0415 07/16/21 1330 07/17/21 0514 07/18/21 0151 07/19/21 0313 07/20/21 0209  WBC 12.3*  --  15.2* 13.8* 15.7* 14.5*  NEUTROABS 10.6*  --   --   --   --   --   HGB 7.1*   < > 8.4* 8.7* 8.2* 8.3*  HCT 22.6*   < > 26.3* 27.0* 25.4* 26.2*  MCV 97.8  --  95.3 96.4 97.3 97.4  PLT 222  --  273 267 319 398   < > = values in this interval not displayed.   Blood Culture    Component Value Date/Time   SDES TRACHEAL ASPIRATE 07/12/2021 0817   SPECREQUEST NONE 07/12/2021 0817   CULT  07/12/2021 0817    Normal respiratory flora-no Staph aureus or Pseudomonas seen Performed at [facility] [address], [place], [place] [address]    REPTSTATUS 07/14/2021 FINAL 07/12/2021 0817    Cardiac Enzymes: No results for input(s): CKTOTAL, CKMB,  CKMBINDEX, TROPONINI in the last 168 hours. CBG: Recent Labs  Lab 07/19/21 2034 07/20/21 0042 07/20/21 0102 07/20/21 0457 07/20/21 0706  GLUCAP 104* 78 102* 106* 118*   Iron Studies: No results for input(s): IRON, TIBC, TRANSFERRIN, FERRITIN in the last 72 hours. @lablastinr3 @ Studies/Results: DG Chest Port 1 View  Result Date: 07/20/2021 CLINICAL DATA:  Status post CABG EXAM: PORTABLE CHEST 1 VIEW COMPARISON:  07/18/2021 chest radiograph. FINDINGS: Intact sternotomy wires. Left internal jugular central venous catheter terminates over the left brachiocephalic vein and just to the right of midline, unchanged. Stable cardiomediastinal silhouette with borderline mild cardiomegaly. No pneumothorax. Possible trace left pleural effusion. No right pleural effusion. No overt pulmonary edema. Stable mild elevation of the left hemidiaphragm with left basilar atelectasis. IMPRESSION: 1. Stable borderline mild cardiomegaly without overt pulmonary edema. 2. Possible trace left pleural effusion. 3. Stable mild elevation of the left hemidiaphragm with left basilar atelectasis. Electronically Signed   By: Ilona Sorrel M.D.   On: 07/20/2021 07:14   DG CHEST PORT 1 VIEW  Result Date: 07/18/2021 CLINICAL DATA:  Hypoxia. EXAM: PORTABLE CHEST 1 VIEW COMPARISON:  Chest radiograph dated 07/16/2021. FINDINGS: Interval removal of the right IJ central venous line.  A feeding tube is noted with tip suboptimally visualized but below the diaphragm. There is elevation of the left hemidiaphragm with left lung base atelectasis. The right lung is clear. No pleural effusion pneumothorax. Stable cardiomegaly. Median sternotomy wires and CABG vascular clips. No acute osseous pathology. IMPRESSION: Elevated left hemidiaphragm with left lung base atelectasis. Electronically Signed   By: Anner Crete M.D.   On: 07/18/2021 20:51   DG Abd Portable 1V  Result Date: 07/18/2021 CLINICAL DATA:  Feeding tube placement EXAM: PORTABLE  ABDOMEN - 1 VIEW COMPARISON:  07/17/2021 FINDINGS: There is gaseous distention of stomach. Enteric tube is noted following the course 3rd to 4th portion duodenum. There is no significant small bowel dilation. Gas is present in colon. Degenerative changes are noted in lumbar spine. Left hemidiaphragm is elevated. IMPRESSION: There is no significant change in position of enteric tube with its tip in the region of 3rd to 4th portion of duodenum.There is gaseous distention of stomach. There is no significant small bowel dilation. Electronically Signed   By: Elmer Picker M.D.   On: 07/18/2021 13:23   ECHOCARDIOGRAM LIMITED  Result Date: 07/18/2021    ECHOCARDIOGRAM LIMITED REPORT   Patient Name:   Billy [name] Date of Exam: 07/18/2021 Medical Rec #:  [id number]      Height:       74.0 in Accession #:    [id number]     Weight:       233.5 lb Date of Birth:  July 29, 1966      BSA:          2.321 m Patient Age:    55 years       BP:           114/74 mmHg Patient Gender: M              HR:           82 bpm. Exam Location:  Inpatient Procedure: Limited Echo Indications:     Cardiomyopathy  History:         Patient has prior history of Echocardiogram examinations, most                  recent 07/11/2021. CHF, CAD and Previous Myocardial Infarction,                  Prior CABG and MAZE procedure, Arrythmias:Atrial Fibrillation,                  Signs/Symptoms:CKD; Risk Factors:Diabetes, Hypertension and                  Obesity.  Sonographer:     Dustin Flock RDCS Referring Phys:  [id number] [name] Diagnosing Phys: Eleonore Chiquito MD IMPRESSIONS  1. Limited study. LVEF appears to have improved 45-50%. Global hypokinesis. RV function appears normal.  2. Left ventricular ejection fraction, by estimation, is 45 to 50%. The left ventricle has mildly decreased function. The left ventricle demonstrates global hypokinesis.  3. Right ventricular systolic function is normal. The right ventricular size is normal.  4. The  mitral valve is grossly normal. Comparison(s): Changes from prior study are noted. FINDINGS  Left Ventricle: Left ventricular ejection fraction, by estimation, is 45 to 50%. The left ventricle has mildly decreased function. The left ventricle demonstrates global hypokinesis. The left ventricular internal cavity size was normal in size. There is  no left ventricular hypertrophy. Right Ventricle: The right ventricular size is normal. No increase in right ventricular wall  thickness. Right ventricular systolic function is normal. Pericardium: There is no evidence of pericardial effusion. Mitral Valve: The mitral valve is grossly normal. Mild to moderate mitral annular calcification. LEFT VENTRICLE PLAX 2D LVIDd:         4.92 cm      Diastology LVIDs:         3.50 cm      LV e' medial:  7.18 cm/s LV PW:         1.06 cm      LV e' lateral: 5.87 cm/s LV IVS:        1.03 cm  LV Volumes (MOD) LV vol d, MOD A4C: 138.0 ml LV vol s, MOD A4C: 63.1 ml LV SV MOD A4C:     138.0 ml Eleonore Chiquito MD Electronically signed by Eleonore Chiquito MD Signature Date/Time: 07/18/2021/12:46:29 PM    Final (Updated)    Medications:  sodium chloride Stopped (07/13/21 0234)   sodium chloride     sodium chloride     albumin human 12.5 g (07/08/21 1827)   And   sodium chloride Stopped (07/08/21 2051)   [START ON 07/21/2021]  ceFAZolin (ANCEF) IV     heparin 600 Units/hr (07/20/21 0700)   lactated ringers Stopped (07/16/21 0848)   promethazine (PHENERGAN) injection (IM or IVPB) Stopped (07/09/21 1628)    ALPRAZolam  1 mg Oral TID   amiodarone  200 mg Oral BID   Followed by   Derrill Memo ON 07/23/2021] amiodarone  200 mg Oral Daily   aspirin  81 mg Oral Daily   Or   aspirin EC  81 mg Oral Daily   atorvastatin  80 mg Oral Daily   B-complex with vitamin C  1 tablet Oral Daily   carvedilol  3.125 mg Oral BID WC   chlorhexidine  15 mL Mouth Rinse BID   Chlorhexidine Gluconate Cloth  6 each Topical Daily   Chlorhexidine Gluconate Cloth  6  each Topical Q0600   clopidogrel  75 mg Oral Daily   feeding supplement (PROSource TF)  45 mL Per Tube QID   hydrALAZINE  12.5 mg Oral TID   insulin aspart  3-9 Units Subcutaneous Q4H   insulin aspart  6 Units Subcutaneous Q4H   insulin detemir  25 Units Subcutaneous BID   isosorbide dinitrate  10 mg Oral TID   mouth rinse  15 mL Mouth Rinse q12n4p   metoCLOPramide (REGLAN) injection  10 mg Intravenous Q6H   multivitamin with minerals  1 tablet Oral Daily   pantoprazole  40 mg Oral Daily   sertraline  50 mg Oral Daily   sodium chloride flush  3 mL Intravenous Q12H   traZODone  100 mg Oral QHS     Assessment/Plan: 55yo M DM, HTN, A fib, CKD currently being followed for severe AKI s/p CABG x 4 and MAZE.   **Severe AKI on CKD 3b:  Baseline 1.6 (Followed by Dr. Moshe Cipro outpt), was 1.9 prior to CABG and now AKI in setting of hemodynamic instability post op likely ATN but cholesterol embolic injury possible.  Initiated CRRT 10/27 for UF with ~anuria and volume overload.  Now extubated with improvement.  Urine output has intermittently been present but fairly low.  Off of CRRT on 11/4.  Plan was to do dialysis yesterday but got delayed.  We will do dialysis today and then reassess renal replacement therapy needs daily. -IR consult placed to switch temporary to tunneled HD cath when able  **Anemia: Hemoglobin stable in the  8s.  Check iron studies. Transfuse as needed.  May consider ESA next week  **Hyponatremia: Has resolved with volume removal.  **CAD s/p CABG  **A fib: amiodarone, anticoag.  **DM: per primary  **Shock: No longer requiring norepinephrine or dobutamine.  Resolved  **Pneumonia: s/p treatment

## 2021-07-20 NOTE — Plan of Care (Signed)
  Problem: Education: Goal: Knowledge of General Education information will improve Description: Including pain rating scale, medication(s)/side effects and non-pharmacologic comfort measures Outcome: Not Progressing   Problem: Health Behavior/Discharge Planning: Goal: Ability to manage health-related needs will improve Outcome: Not Progressing   Problem: Clinical Measurements: Goal: Diagnostic test results will improve Outcome: Progressing Goal: Respiratory complications will improve Outcome: Not Progressing   Problem: Nutrition: Goal: Adequate nutrition will be maintained Outcome: Progressing   Problem: Coping: Goal: Level of anxiety will decrease Outcome: Not Progressing

## 2021-07-20 NOTE — Progress Notes (Signed)
12 Days Post-Op Procedure(s) (LRB): CORONARY ARTERY BYPASS GRAFTING (CABG) TIMES FOUR USING LEFT INTERNAL MAMMARY ARTERY, GREATER SAPHENOUS VEIN HARVESTED ENDOSCOPICALLY, AND LEFT RADIAL ARTERY HARVESTED OPEN (N/A) TRANSESOPHAGEAL ECHOCARDIOGRAM (TEE) (N/A) RADIAL ARTERY HARVEST (Left) ENDOVEIN HARVEST OF GREATER SAPHENOUS VEIN (Right) MAZE Subjective: Has been belching, says stomach is better He did eat breakfast this AM  Objective: Vital signs in last 24 hours: Temp:  [98.3 F (36.8 C)-98.5 F (36.9 C)] 98.5 F (36.9 C) (11/06 0700) Pulse Rate:  [77-88] 84 (11/06 0800) Cardiac Rhythm: Normal sinus rhythm (11/06 0400) Resp:  [12-35] 21 (11/06 0800) BP: (88-141)/(48-77) 115/69 (11/06 0800) SpO2:  [91 %-100 %] 100 % (11/06 0800) Weight:  [111.8 kg] 111.8 kg (11/06 0700)  Hemodynamic parameters for last 24 hours:    Intake/Output from previous day: 11/05 0701 - 11/06 0700 In: 533.6 [P.O.:360; I.V.:113.6; NG/GT:60] Out: 100 [Stool:100] Intake/Output this shift: Total I/O In: 246 [P.O.:240; I.V.:6] Out: 700 [Urine:700]  General appearance: alert, cooperative, and no distress Neurologic: intact Heart: regular rate and rhythm Lungs: diminished breath sounds bibasilar Abdomen: less distended, still tympanitic Wound: clean and dry  Lab Results: Recent Labs    07/19/21 0313 07/20/21 0209  WBC 15.7* 14.5*  HGB 8.2* 8.3*  HCT 25.4* 26.2*  PLT 319 398   BMET:  Recent Labs    07/19/21 0313 07/20/21 0209  NA 135 135  K 4.2 4.4  CL 102 103  CO2 25 21*  GLUCOSE 191* 110*  BUN 53* 77*  CREATININE 4.74* 6.36*  CALCIUM 8.2* 8.2*    PT/INR: No results for input(s): LABPROT, INR in the last 72 hours. ABG    Component Value Date/Time   PHART 7.459 (H) 07/16/2021 1330   HCO3 24.8 07/16/2021 1330   TCO2 26 07/16/2021 1330   ACIDBASEDEF 9.0 (H) 07/10/2021 0303   O2SAT 70.1 07/20/2021 0624   CBG (last 3)  Recent Labs    07/20/21 0102 07/20/21 0457 07/20/21 0706   GLUCAP 102* 106* 118*    Assessment/Plan: S/P Procedure(s) (LRB): CORONARY ARTERY BYPASS GRAFTING (CABG) TIMES FOUR USING LEFT INTERNAL MAMMARY ARTERY, GREATER SAPHENOUS VEIN HARVESTED ENDOSCOPICALLY, AND LEFT RADIAL ARTERY HARVESTED OPEN (N/A) TRANSESOPHAGEAL ECHOCARDIOGRAM (TEE) (N/A) RADIAL ARTERY HARVEST (Left) ENDOVEIN HARVEST OF GREATER SAPHENOUS VEIN (Right) MAZE - Looks better this AM CV- in SR RESP_ CXR looks better this AM, still elevated left hemidiaphragm RENAL- straight cath this AM- 700 ml of urine present  Creatinine up to 6.36, bicarb 21  Was not dialyzed yesterday, hopefully will be today ENDO- CBG well controlled GI- still has gastric distention on Abdominal films, but improved from yesterday  Tolerating POs Anemia- stable Deconditioning- continue to mobilize as tolerated  LOS: 13 days    Melrose Nakayama 07/20/2021

## 2021-07-21 DIAGNOSIS — N179 Acute kidney failure, unspecified: Secondary | ICD-10-CM | POA: Diagnosis not present

## 2021-07-21 DIAGNOSIS — J9601 Acute respiratory failure with hypoxia: Secondary | ICD-10-CM | POA: Diagnosis not present

## 2021-07-21 DIAGNOSIS — I5043 Acute on chronic combined systolic (congestive) and diastolic (congestive) heart failure: Secondary | ICD-10-CM | POA: Diagnosis not present

## 2021-07-21 LAB — RENAL FUNCTION PANEL
Albumin: 2.3 g/dL — ABNORMAL LOW (ref 3.5–5.0)
Anion gap: 10 (ref 5–15)
BUN: 59 mg/dL — ABNORMAL HIGH (ref 6–20)
CO2: 24 mmol/L (ref 22–32)
Calcium: 8.1 mg/dL — ABNORMAL LOW (ref 8.9–10.3)
Chloride: 100 mmol/L (ref 98–111)
Creatinine, Ser: 5.26 mg/dL — ABNORMAL HIGH (ref 0.61–1.24)
GFR, Estimated: 12 mL/min — ABNORMAL LOW (ref >60)
Glucose, Bld: 77 mg/dL (ref 70–99)
Phosphorus: 6 mg/dL — ABNORMAL HIGH (ref 2.5–4.6)
Potassium: 4 mmol/L (ref 3.5–5.1)
Sodium: 134 mmol/L — ABNORMAL LOW (ref 135–145)

## 2021-07-21 LAB — CBC
HCT: 23.9 % — ABNORMAL LOW (ref 39.0–52.0)
Hemoglobin: 7.7 g/dL — ABNORMAL LOW (ref 13.0–17.0)
MCH: 30.6 pg (ref 26.0–34.0)
MCHC: 32.2 g/dL (ref 30.0–36.0)
MCV: 94.8 fL (ref 80.0–100.0)
Platelets: 381 10*3/uL (ref 150–400)
RBC: 2.52 MIL/uL — ABNORMAL LOW (ref 4.22–5.81)
RDW: 13 % (ref 11.5–15.5)
WBC: 11.6 10*3/uL — ABNORMAL HIGH (ref 4.0–10.5)
nRBC: 0 % (ref 0.0–0.2)

## 2021-07-21 LAB — GLUCOSE, CAPILLARY
Glucose-Capillary: 119 mg/dL — ABNORMAL HIGH (ref 70–99)
Glucose-Capillary: 130 mg/dL — ABNORMAL HIGH (ref 70–99)
Glucose-Capillary: 133 mg/dL — ABNORMAL HIGH (ref 70–99)
Glucose-Capillary: 154 mg/dL — ABNORMAL HIGH (ref 70–99)
Glucose-Capillary: 63 mg/dL — ABNORMAL LOW (ref 70–99)
Glucose-Capillary: 77 mg/dL (ref 70–99)
Glucose-Capillary: 87 mg/dL (ref 70–99)

## 2021-07-21 LAB — MAGNESIUM: Magnesium: 2.4 mg/dL (ref 1.7–2.4)

## 2021-07-21 LAB — COOXEMETRY PANEL
Carboxyhemoglobin: 1.9 % — ABNORMAL HIGH (ref 0.5–1.5)
Methemoglobin: 0.8 % (ref 0.0–1.5)
O2 Saturation: 68.6 %
Total hemoglobin: 7.7 g/dL — ABNORMAL LOW (ref 12.0–16.0)

## 2021-07-21 LAB — HEPARIN LEVEL (UNFRACTIONATED)
Heparin Unfractionated: <0.1 IU/mL — ABNORMAL LOW (ref 0.30–0.70)
Heparin Unfractionated: <0.1 IU/mL — ABNORMAL LOW (ref 0.30–0.70)

## 2021-07-21 MED ORDER — SODIUM CHLORIDE 0.9% FLUSH
3.0000 mL | Freq: Two times a day (BID) | INTRAVENOUS | Status: DC
  Administered 2021-07-21 (×2): 3 mL via INTRAVENOUS

## 2021-07-21 MED ORDER — INSULIN ASPART 100 UNIT/ML IJ SOLN
3.0000 [IU] | Freq: Three times a day (TID) | INTRAMUSCULAR | Status: DC
Start: 2021-07-21 — End: 2021-07-27
  Administered 2021-07-21: 3 [IU] via SUBCUTANEOUS
  Administered 2021-07-22: 9 [IU] via SUBCUTANEOUS
  Administered 2021-07-22: 6 [IU] via SUBCUTANEOUS
  Administered 2021-07-23 (×2): 3 [IU] via SUBCUTANEOUS
  Administered 2021-07-23: 9 [IU] via SUBCUTANEOUS
  Administered 2021-07-24: 3 [IU] via SUBCUTANEOUS
  Administered 2021-07-24: 6 [IU] via SUBCUTANEOUS
  Administered 2021-07-24 – 2021-07-25 (×2): 9 [IU] via SUBCUTANEOUS
  Administered 2021-07-25: 7 [IU] via SUBCUTANEOUS
  Administered 2021-07-25 – 2021-07-26 (×2): 9 [IU] via SUBCUTANEOUS
  Administered 2021-07-26: 6 [IU] via SUBCUTANEOUS
  Administered 2021-07-26: 3 [IU] via SUBCUTANEOUS

## 2021-07-21 MED ORDER — SODIUM CHLORIDE 0.9 % IV SOLN
250.0000 mg | Freq: Every day | INTRAVENOUS | Status: AC
  Administered 2021-07-21 – 2021-07-22 (×2): 250 mg via INTRAVENOUS
  Filled 2021-07-21 (×2): qty 20

## 2021-07-21 MED ORDER — ~~LOC~~ CARDIAC SURGERY, PATIENT & FAMILY EDUCATION: Freq: Once | Status: AC

## 2021-07-21 MED ORDER — INSULIN DETEMIR 100 UNIT/ML ~~LOC~~ SOLN
20.0000 [IU] | Freq: Two times a day (BID) | SUBCUTANEOUS | Status: DC
  Administered 2021-07-21 – 2021-07-31 (×20): 20 [IU] via SUBCUTANEOUS
  Filled 2021-07-21 (×25): qty 0.2

## 2021-07-21 MED ORDER — DARBEPOETIN ALFA 40 MCG/0.4ML IJ SOSY
40.0000 ug | PREFILLED_SYRINGE | Freq: Once | INTRAMUSCULAR | Status: AC
  Administered 2021-07-21: 40 ug via SUBCUTANEOUS
  Filled 2021-07-21: qty 0.4

## 2021-07-21 MED ORDER — SODIUM CHLORIDE 0.9 % IV SOLN: 250.0000 mL | INTRAVENOUS | Status: DC | PRN

## 2021-07-21 MED ORDER — CHLORHEXIDINE GLUCONATE CLOTH 2 % EX PADS
6.0000 | MEDICATED_PAD | Freq: Every day | CUTANEOUS | Status: DC
  Administered 2021-07-22: 6 via TOPICAL

## 2021-07-21 MED ORDER — FUROSEMIDE 10 MG/ML IJ SOLN
80.0000 mg | Freq: Once | INTRAMUSCULAR | Status: AC
  Administered 2021-07-21: 80 mg via INTRAVENOUS
  Filled 2021-07-21: qty 8

## 2021-07-21 MED ORDER — SODIUM CHLORIDE 0.9% FLUSH: 3.0000 mL | INTRAVENOUS | Status: DC | PRN

## 2021-07-21 MED ORDER — CALCIUM ACETATE (PHOS BINDER) 667 MG PO CAPS
667.0000 mg | ORAL_CAPSULE | Freq: Three times a day (TID) | ORAL | Status: DC
  Administered 2021-07-22 – 2021-07-26 (×9): 667 mg via ORAL
  Filled 2021-07-21 (×11): qty 1

## 2021-07-21 NOTE — Progress Notes (Signed)
Inpatient Diabetes Program Recommendations  AACE/ADA: New Consensus Statement on Inpatient Glycemic Control (2015)  Target Ranges:  Prepandial:   less than 140 mg/dL      Peak postprandial:   less than 180 mg/dL (1-2 hours)      Critically ill patients:  140 - 180 mg/dL   Lab Results  Component Value Date   GLUCAP 133 (H) 07/21/2021   HGBA1C 6.7 (H) 07/07/2021    Review of Glycemic Control Results for MAKYA, YURKO (MRN [id number]) as of 07/21/2021 14:21  Ref. Range 07/20/2021 20:20 07/21/2021 00:20 07/21/2021 03:56 07/21/2021 04:25 07/21/2021 06:47  Glucose-Capillary Latest Ref Range: 70 - 99 mg/dL 181 (H) 87 63 (L) 77 133 (H)   Inpatient Diabetes Program Recommendations:   Patient is now eating and tube feeds are discontinued. Please consider: -D/C Novolog meal coverage -Decrease Novolog correction to 0-9 units tid + hs then q 4 hrs. When NPO. -Decrease Semglee to 20 units Secure chat sent to [facility].  Thank you, Nani Gasser. Lakeyta Vandenheuvel, RN, MSN, CDE  Diabetes Coordinator Inpatient Glycemic Control Team Team Pager [phone] (8am-5pm) 07/21/2021 2:27 PM

## 2021-07-21 NOTE — Progress Notes (Addendum)
Patient ID: Billy [name], male   DOB: 06-Dec-1965, 55 y.o.   MRN: [id number]     Advanced Heart Failure Rounding Note  PCP-Cardiologist: None   Subjective:    10/25: S/P CABG x4 with MAZE . Intra Op EF 20%.  10/27: Intubated. Hypotensive. Placed on Norepi + Vaso. Recurrent A fib with amio increased to 60 mg/hr.  10/29: Co-ox low, DBA added 11/1: Extubated. AF with RVR, amio gtt restarted 11/03: Dobutamine off 11/6: Started iHD   Earlier this morning given IV lasix per Nephrology. Creatinine 6.4>5.3  On low-dose heparin. No bleeding. Hgb trending down 8.3>7.7   Denies SOB. Wants to try urinate.   Objective:   Weight Range: 111.3 kg Body mass index is 31.5 kg/m.   Vital Signs:   Temp:  [98.2 F (36.8 C)-98.5 F (36.9 C)] 98.5 F (36.9 C) (11/06 2300) Pulse Rate:  [72-95] 76 (11/07 0600) Resp:  [17-34] 22 (11/07 0600) BP: (82-164)/(45-133) 103/59 (11/07 0600) SpO2:  [91 %-100 %] 99 % (11/07 0600) Weight:  [111.3 kg-111.8 kg] 111.3 kg (11/07 0500) Last BM Date: 07/20/21  Weight change: Filed Weights   07/20/21 0700 07/20/21 1426 07/21/21 0500  Weight: 111.8 kg 111.8 kg 111.3 kg    Intake/Output:   Intake/Output Summary (Last 24 hours) at 07/21/2021 0726 Last data filed at 07/21/2021 0458 Gross per 24 hour  Intake 731.52 ml  Output 2200 ml  Net -1468.48 ml      Physical Exam   General: In bed. No resp difficulty HEENT: normal Neck: supple. no JVD. Carotids 2+ bilat; no bruits. No lymphadenopathy or thryomegaly appreciated. LIJ Cor: PMI nondisplaced. Regular rate & rhythm. No rubs, gallops or murmurs. Lungs: clear Abdomen: soft, nontender, nondistended. No hepatosplenomegaly. No bruits or masses. Good bowel sounds. Extremities: no cyanosis, clubbing, rash, edema Neuro: alert & orientedx3, cranial nerves grossly intact. moves all 4 extremities w/o difficulty. Affect pleasant  Telemetry   NSR 80s   Labs    CBC Recent Labs    07/20/21 0209  07/21/21 0055  WBC 14.5* 11.6*  HGB 8.3* 7.7*  HCT 26.2* 23.9*  MCV 97.4 94.8  PLT 398 497   Basic Metabolic Panel Recent Labs    07/20/21 0209 07/21/21 0055  NA 135 134*  K 4.4 4.0  CL 103 100  CO2 21* 24  GLUCOSE 110* 77  BUN 77* 59*  CREATININE 6.36* 5.26*  CALCIUM 8.2* 8.1*  MG 3.0* 2.4  PHOS 6.4* 6.0*   Liver Function Tests Recent Labs    07/20/21 0209 07/21/21 0055  ALBUMIN 2.4* 2.3*   No results for input(s): LIPASE, AMYLASE in the last 72 hours. Cardiac Enzymes No results for input(s): CKTOTAL, CKMB, CKMBINDEX, TROPONINI in the last 72 hours.  BNP: BNP (last 3 results) No results for input(s): BNP in the last 8760 hours.  ProBNP (last 3 results) No results for input(s): PROBNP in the last 8760 hours.   D-Dimer No results for input(s): DDIMER in the last 72 hours. Hemoglobin A1C No results for input(s): HGBA1C in the last 72 hours.  Fasting Lipid Panel No results for input(s): CHOL, HDL, LDLCALC, TRIG, CHOLHDL, LDLDIRECT in the last 72 hours.   Thyroid Function Tests No results for input(s): TSH, T4TOTAL, T3FREE, THYROIDAB in the last 72 hours.  Invalid input(s): FREET3  Other results:   Imaging    DG Abd 1 View  Result Date: 07/20/2021 CLINICAL DATA:  Follow-up ileus. EXAM: ABDOMEN - 1 VIEW COMPARISON:  07/18/2021 FINDINGS: Previously seen  feeding tube is no longer visualized. Gaseous distension stomach is noted. No evidence of dilated bowel loops. No definite radiopaque urinary calculi identified. IMPRESSION: Gaseous distension of stomach.  No evidence of dilated bowel loops. Electronically Signed   By: Marlaine Hind M.D.   On: 07/20/2021 09:47     Medications:     Scheduled Medications:  ALPRAZolam  1 mg Oral TID   amiodarone  200 mg Oral BID   Followed by   Derrill Memo ON 07/23/2021] amiodarone  200 mg Oral Daily   aspirin  81 mg Oral Daily   Or   aspirin EC  81 mg Oral Daily   atorvastatin  80 mg Oral Daily   B-complex with vitamin  C  1 tablet Oral Daily   calcium acetate  667 mg Oral TID WC   carvedilol  3.125 mg Oral BID WC   chlorhexidine  15 mL Mouth Rinse BID   Chlorhexidine Gluconate Cloth  6 each Topical Daily   Chlorhexidine Gluconate Cloth  6 each Topical Q0600   clopidogrel  75 mg Oral Daily   [facility], Patient [facility] Family Education   Does not apply Once   darbepoetin (ARANESP) injection - NON-DIALYSIS  40 mcg Subcutaneous Once   feeding supplement (PROSource TF)  45 mL Per Tube QID   furosemide  80 mg Intravenous Once   hydrALAZINE  12.5 mg Oral TID   insulin aspart  3-9 Units Subcutaneous Q4H   insulin aspart  6 Units Subcutaneous Q4H   insulin detemir  25 Units Subcutaneous BID   isosorbide dinitrate  10 mg Oral TID   mouth rinse  15 mL Mouth Rinse q12n4p   multivitamin with minerals  1 tablet Oral Daily   pantoprazole  40 mg Oral Daily   sertraline  50 mg Oral Daily   sodium chloride flush  3 mL Intravenous Q12H   sodium chloride flush  3 mL Intravenous Q12H   traZODone  100 mg Oral QHS    Infusions:  sodium chloride Stopped (07/13/21 0234)   sodium chloride     sodium chloride     sodium chloride     albumin human 12.5 g (07/08/21 1827)   And   sodium chloride Stopped (07/08/21 2051)    ceFAZolin (ANCEF) IV     ferric gluconate (FERRLECIT) IVPB     heparin 600 Units/hr (07/21/21 0458)   lactated ringers Stopped (07/16/21 0848)   promethazine (PHENERGAN) injection (IM or IVPB) Stopped (07/09/21 1628)    PRN Medications: sodium chloride, sodium chloride, sodium chloride, acetaminophen, albumin human **AND** sodium chloride, albuterol, alteplase, docusate, heparin, lidocaine (PF), lidocaine-prilocaine, metoprolol tartrate, ondansetron (ZOFRAN) IV, oxyCODONE, pentafluoroprop-tetrafluoroeth, polyethylene glycol, promethazine (PHENERGAN) injection (IM or IVPB), sennosides, simethicone, sodium chloride flush, sodium chloride flush    Patient Profile   [name] is a 34  year with a history of visually impaired, DM, HTN, depression, anxiety takes 2 mg xanax 3 times a day, SVT, CKD Stage II, PAF, and CAD.Marland Kitchen   Anterior Stemi--> CABG--> cardiogenic shock.   Assessment/Plan   1. CAD: Admitted with anterolateral MI from occluded diagonal treated with DES.  Patient then had CABG with LIMA-LAD, SVG-D, SVG-PDA, and left radial-OM for complete revascularization on 10/25   - Continue ASA 81. Will likely drop this at discharge given need for Plavix and anticoagulation.  - Continue Plavix 75 daily.  - atorvastatin 80 mg daily.  - Amlodipine for radial harvest held with hypotension on pressors.    -  No chest pain.  2. Atrial fibrillation: Paroxysmal, s/p Maze and appendage clipping.  Having brief runs of atrial fibrillation but not sustained - Maintaining NSR - Continue amiodarone 200 mg bid.  - Tolerating low-dose heparin @ 600 u/hr. No bleeding. HL < 0.1.  Consider DOAC once tunneled HD cath placed. - Hgb 8.7 -> 8.2 -> 8.3 ->7.7  - No obvious source.  - In patients with surgical LA appendage closure, if unable to take anticoagulation long-term should get DOAC 3 months then assess for completeness of closure with TEE.  If able to take anticoagulation, the LAA closure is adjunctive.  3. AKI on CKD stage 3: Baseline creatinine around 1.6, up to 5 post-op.  He has likely had multiple hits including contrast with initial cath/PCI and possible peri-operative hypotension.  Suspect ATN. Concerned about chances of renal recovery. 11/6 made 700 cc urine overnight, Nephrology following. CVVHD stopped 11/4 - Had iHD 11/6. Tunneled cath today 4. Acute systolic CHF: Ischemic cardiomyopathy.  Echo prior to this admission with EF 50-55% (pre-MI and CABG).  Limited echo on 10/28 showed minimal pericardial effusion, EF in 25% range. Intraoperative TEE with EF 20%. Off vasopressin + NE + Off DBA . Co-ox stable off inotrope - Continue hydralazine 12.5 mg tid + isordil 10 tid. Will not push  today as need BP for iHD - Continue  coreg 3.125 mg BID - No ARN/SGLT2 with AKI - Tolerated iHD - Given IV lasix per nephrology today.  5. Anemia: Transfuse hgb < 8, had 1 unit 11/02 6. ID: Afebrile, off abx. WBC stable 11.6  - Course of cefepime completed 7. Acute hypoxemic respiratory failure: Suspect primarily pulmonary edema, now intubated.  Possible PNA with bibasilar lung opacities. Extubated, completed course of cefepime, volume status now looks good. 02 stable on 2L [place]. Continue to wean.  8. Thrombocytopenia: Resolved.  9. Right sided weakness: Normal head CT 11/03   Tunneled catheter today. PT following. ? CIR reassess.   Transferring to [facility] today.   Amy Clegg NP-C 07/21/2021 7:26 AM  Patient seen with NP, agree with the above note.   MAP stable, has been up to bedside commode. Had 750 cc UOP over last day.  Also had 1st HD yesterday.   Remains in NSR on po amiodarone and on low dose heparin gtt.   General: NAD Neck: No JVD, no thyromegaly or thyroid nodule.  Lungs: Clear to auscultation bilaterally with normal respiratory effort. CV: Nondisplaced PMI.  Heart regular S1/S2, no S3/S4, no murmur.  No peripheral edema.   Abdomen: Soft, nontender, no hepatosplenomegaly, no distention.  Skin: Intact without lesions or rashes.  Neurologic: Alert and oriented x 3.  Psych: Normal affect. Extremities: No clubbing or cyanosis.  HEENT: Normal.    Tentative plan for HD tomorrow but follow UOP today.  To get tunneled catheter.  Will continue current doses hydralazine/isordil, avoid aggressive drop in MAP.   Remains in NSR on amiodarone.  Will need to decide long-term anticoagulation in setting of LA appendage clip with surgery.  My best understanding of the available data is in the NP's note above under #2.   Loralie Champagne 07/21/2021 8:05 AM

## 2021-07-21 NOTE — Progress Notes (Signed)
[place] for heparin Indication: atrial fibrillation  No Known Allergies  Patient Measurements: Height: 6\' 2"  (188 cm) Weight: 111.3 kg (245 lb 6 oz) IBW/kg (Calculated) : 82.2 HEPARIN DW (KG): 106.6   Vital Signs: Temp: 98.2 F (36.8 C) (11/07 0842) Temp Source: Oral (11/07 0842) BP: 122/Billy (11/07 0900) Pulse Rate: 79 (11/07 0900)  Labs: Recent Labs    07/19/21 0313 07/19/21 1438 07/20/21 0209 07/21/21 0055 07/21/21 0838  HGB 8.2*  --  8.3* 7.7*  --   HCT 25.4*  --  26.2* 23.9*  --   PLT 319  --  398 381  --   HEPARINUNFRC  --    < > <0.10* <0.10* <0.10*  CREATININE 4.74*  --  6.36* 5.26*  --    < > = values in this interval not displayed.     Estimated Creatinine Clearance: 20.8 mL/min (A) (by C-G formula based on SCr of 5.26 mg/dL (H)).   Medical History: Past Medical History:  Diagnosis Date   Anxiety    Depression    Diabetic neuropathy ([place])    Diabetic retinopathy ([place])    visual impairment   Dysrhythmia    "palpatations sometimes" (06/30/2017)   Exertional dyspnea 06/30/2021   Family history of adverse reaction to anesthesia    sister had OR 2016; "couldn't wake up; could hear what they were saying but couldn't get their attention; like I was paralyzed but fully awake" (06/30/2017)   Pneumonia X 2   SVT (supraventricular tachycardia) ([place])    Toe ulcer ([place]) 06/30/2017   2nd digit   Type II diabetes mellitus ([place])     Medications:  Infusions:   sodium chloride Stopped (07/13/21 0234)   sodium chloride     sodium chloride     sodium chloride     albumin human 12.5 g (07/08/21 1827)   And   sodium chloride Stopped (07/08/21 2051)    ceFAZolin (ANCEF) IV     ferric gluconate (FERRLECIT) IVPB     heparin 600 Units/hr (07/21/21 0900)   lactated ringers Stopped (07/16/21 0848)   promethazine (PHENERGAN) injection (IM or IVPB) Stopped (07/09/21 1628)    Assessment: 55 yo Billy [name] s/p CABG, MAZE, and  atriaclip on 10/25. Has experienced intermittent post-op a fib treated with amiodarone and was previously on a therapeutic heparin gtt with goal of 0.3-0.5. Also noted to have post-op bleeding and received RBCs on 11/2.   Heparin level still undetectable this morning on fixed rate heparin. Discussed with surgery and cardiology this morning. Will leave at fixed rate until after tunneled HD cath is completed then plan to transition to doac once all procedures are complete.   Goal of Therapy:  Fixed dose heparin per MD Monitor platelets by anticoagulation protocol: Yes   Plan:  -No titration of heparin; continue 600 units/hr -Heparin level and CBC in am  Erin Hearing PharmD., BCPS Clinical Pharmacist 07/21/2021 9:44 AM

## 2021-07-21 NOTE — Progress Notes (Addendum)
   NAME:  Billy [name], MRN:  [id number], DOB:  1965-10-20, LOS: 34 ADMISSION DATE:  07/07/2021, CONSULTATION DATE:  07/21/2021  REFERRING MD:  Lynnette Caffey, CHIEF COMPLAINT:  cardiogenic shock , on bipap   History of Present Illness:  55 year old diabetic, hypertensive admitted 10/24 with STEMI.  He had recent admission 10/11 for chest pain and hypertensive urgency with normal LVEF on echo.  He underwent left heart cath which showed three-vessel disease, underwent PCI with DES to left circumflex followed by CABG x4 with radial harvest and maze procedure.  Intra-Op EF was noted to be 20%.  He was extubated postop day 1 but developed progressive shock requiring Levophed and vasopressin with AKI and oliguria.  He was placed on BiPAP for hypoxia, ABG showed metabolic acidosis for which she was given 3 amps of bicarb and PCCM consulted Seen by heart failure service, coox 56% started on milrinone  Pertinent  Medical History  Diabetes Hypertension CKD stage II -Baseline creatinine 1.8 Paroxysmal atrial fibrillation Anxiety -on Xanax 2 mg thrice daily  Significant Hospital Events:   10/24 admitted with STEMI  10/27 PCCM consulted, due to respiratory issues intubated early morning   10/28 No acute events overnight, flipped back into NSR with less pressor requirement and decreased FIO2 this AM 10/30 increasing FiO2 again as fluid removal decreased.  Copious secretions. 11/1 extubated 11/2 off dobutamine  Interim History / Subjective:  No acute events overnight. Complaining of some wheeze overnight alleviated with nebs.   Objective   Blood pressure 122/66, pulse 79, temperature 98.2 F (36.8 C), temperature source Oral, resp. rate 19, height 6\' 2"  (1.88 m), weight 111.3 kg, SpO2 97 %.        Intake/Output Summary (Last 24 hours) at 07/21/2021 0934 Last data filed at 07/21/2021 0900 Gross per 24 hour  Intake 503.72 ml  Output 1500 ml  Net -996.28 ml    Filed Weights   07/20/21 0700  07/20/21 1426 07/21/21 0500  Weight: 111.8 kg 111.8 kg 111.3 kg    Examination: General:  middle aged adult male in NAD Neuro:  Alert, oriented, non-focal HEENT:  [place]/AT, No JVD noted, PERRL Cardiovascular:  RRR, no MRG. Trace if any LE edema.  Lungs:  Clear bilateral breath sounds Abdomen:  Soft, non-distended, non-tender.  Musculoskeletal:  No acute deformity or ROM limitation.  Skin:  Intact, MMM   Ancillary tests  His left hemidiaphragm elevation is chronic  Assessment & Plan:   Post 3 vessel CABG complicated by shock, renal failure, neuromuscular weakness, ileus- overall improving, off inotropes.  Cortrak is really bothering him and contributing to agitation, taking PO, making stool.  - Push mobility - Encourage IS - Cortrak out, advance PO as tolerated.  - for tunneled HD cath today.  - Basal bolus insulin as ordered - Transferring out of ICU today per primary.    Georgann Housekeeper, AGACNP-BC [facility]  See Amion for personal pager PCCM on call pager [phone] until 7pm. Please call Elink 7p-7a. [phone]  07/21/2021 9:36 AM

## 2021-07-21 NOTE — Progress Notes (Addendum)
[address]       [place] [address]             [phone]                 13 Days Post-Op Procedure(s) (LRB): CORONARY ARTERY BYPASS GRAFTING (CABG) TIMES FOUR USING LEFT INTERNAL MAMMARY ARTERY, GREATER SAPHENOUS VEIN HARVESTED ENDOSCOPICALLY, AND LEFT RADIAL ARTERY HARVESTED OPEN (N/A) TRANSESOPHAGEAL ECHOCARDIOGRAM (TEE) (N/A) RADIAL ARTERY HARVEST (Left) ENDOVEIN HARVEST OF GREATER SAPHENOUS VEIN (Right) MAZE   Events: No events    _______________________________________________________________ Vitals: BP (!) 103/59   Pulse 76   Temp 98.5 F (36.9 C) (Oral)   Resp (!) 22   Ht 6\' 2"  (1.88 m)   Wt 111.3 kg   SpO2 99%   BMI 31.50 kg/m  Filed Weights   07/20/21 0700 07/20/21 1426 07/21/21 0500  Weight: 111.8 kg 111.8 kg 111.3 kg     - Neuro: alert NAD  - Cardiovascular: sinus  Drips:       - Pulm:  Coarse, EWOB  ABG    Component Value Date/Time   PHART 7.459 (H) 07/16/2021 1330   PCO2ART 34.9 07/16/2021 1330   PO2ART 65 (L) 07/16/2021 1330   HCO3 24.8 07/16/2021 1330   TCO2 26 07/16/2021 1330   ACIDBASEDEF 9.0 (H) 07/10/2021 0303   O2SAT 70.1 07/20/2021 0624    - Abd: Soft - Extremity: warm  .Intake/Output      11/06 0701 11/07 0700 11/07 0701 11/08 0700   P.O. 600    I.V. (mL/kg) 131.5 (1.2)    Other     NG/GT     Total Intake(mL/kg) 731.5 (6.6)    Urine (mL/kg/hr) 700 (0.3)    Emesis/NG output     Other 1500    Stool 0    Total Output 2200    Net -1468.5            _______________________________________________________________ Labs: CBC Latest Ref Rng & Units 07/21/2021 07/20/2021 07/19/2021  WBC 4.0 - 10.5 K/uL 11.6(H) 14.5(H) 15.7(H)  Hemoglobin 13.0 - 17.0 g/dL 7.7(L) 8.3(L) 8.2(L)  Hematocrit 39.0 - 52.0 % 23.9(L) 26.2(L) 25.4(L)  Platelets 150 - 400 K/uL 381 398 319   CMP Latest Ref Rng & Units 07/21/2021 07/20/2021 07/19/2021  Glucose 70 - 99 mg/dL 77 110(H) 191(H)  BUN 6 - 20 mg/dL 59(H) 77(H)  53(H)  Creatinine 0.61 - 1.24 mg/dL 5.26(H) 6.36(H) 4.74(H)  Sodium 135 - 145 mmol/L 134(L) 135 135  Potassium 3.5 - 5.1 mmol/L 4.0 4.4 4.2  Chloride 98 - 111 mmol/L 100 103 102  CO2 22 - 32 mmol/L 24 21(L) 25  Calcium 8.9 - 10.3 mg/dL 8.1(L) 8.2(L) 8.2(L)  Total Protein 6.5 - 8.1 g/dL - - -  Total Bilirubin 0.3 - 1.2 mg/dL - - -  Alkaline Phos 38 - 126 U/L - - -  AST 15 - 41 U/L - - -  ALT 0 - 44 U/L - - -    CXR: -  _______________________________________________________________  Assessment and Plan: POD 13 s/p emergency CABG  Neuro: pain controlled. CV:   On amio PO  Remains in sinus.  Started on hep gtt for burst of afib.  Would prefer no anticoagulation since clipped, and on aspirin and plavix.  Will discuss with heart failure Pulm: aggressive pulm toilet.   Renal:   tolerating IHD GI: tolerating diet.  NPO now for catheter Heme: stable ID: afebrile.   Endo: SSI Dispo:  2C today   Lajuana Matte 07/21/2021 7:15 AM

## 2021-07-21 NOTE — TOC Progression Note (Addendum)
Transition of Care [facility]) - Progression Note    Patient Details  Name: Billy [name] MRN: [id number] Date of Birth: October 01, 1965  Transition of Care [facility]) CM/SW [place], [place] Phone Number: 07/21/2021, 12:15 PM  Clinical Narrative:    HF CSW attempted to see Mr. Devan at bedside however he is not in his room. CSW will attempt again at a later time to discuss discharge plan.  CSW visited Mr. Arriaga at bedside on [place] but is getting ready to transfer to [facility] and CSW will come back to discuss rehab once Mr. Honeyman is settled.  CSW received consult for possible SNF placement at time of discharge. CSW spoke with patient at bedside. Patient reported that he is currently unable to care for himself at his home given his current physical needs and fall risk. Patient expressed understanding of PT recommendation and is agreeable to SNF placement at time of discharge. Patient reports preference for [facility], [facility] or [facility]. CSW discussed insurance authorization process and provided Medicare SNF ratings list. Patient has received COVID vaccines. Patient expressed being hopeful for rehab and to feel better soon. Mr. Chesnut reported he would like the CSW to reach out to his sister [name] and update about the discharge plan and rehab. No further questions reported at this time.   CSW reached out to [facility] to see about bed availability and Claiborne Billings reported she won't have anything available until maybe Monday next week.  CSW attempted to contact Mr. Cutshaw sister, [name] [phone] to discuss the plan for discharge however she didn't answer the phone and CSW left a voicemail for her to return the call. CSW received a call back from [facility] and updated her about the discharge plan for her brother.  Skilled Nursing Rehab Facilities-   [email or web address]   Ratings out of 5 possible   Name Address  Phone # [place] [facility] [place]  [facility] [place] [address] [place] 5 5 2 4   [facility]  [address] [place], [place] [phone] 4 2 5 5   [facility] [place], [place] 4 1 1 1   [place], [place] 2 2 4 4   [facility] [address], [place] 2 1 1 1   [place] [address] [place] [phone] 3 1 4 3   [facility] [address] [place] 5 2 2 3   [facility] [address] [place] 4 1 2 1   [facility] [place], [place] [phone] 5 1 2 2   [facility] [phone] [address], [place] [phone] 4 1 1 1   [facility] [address] [facility] [phone] 4 1 2 1   [place] [place] [phone] 4 1 1 1    CSW will continue to follow through discharge.      Expected Discharge Plan and Services                                                 Social Determinants of Health (SDOH) Interventions    Readmission Risk Interventions No flowsheet data found.  Daryll Spisak, MSW, [place] Heart Failure Social Worker

## 2021-07-21 NOTE — Progress Notes (Signed)
PT Cancellation Note  Patient Details Name: Billy [name] MRN: [id number] DOB: [birth year]   Cancelled Treatment:    Reason Eval/Treat Not Completed: Other (comment). Nurse reports patient has been up/down multiple times this AM and now exhausted. Will check back later today.    Shary Decamp [facility] 07/21/2021, 10:31 AM [place] Pager [phone] Office [phone]

## 2021-07-21 NOTE — Progress Notes (Signed)
OT Cancellation Note  Patient Details Name: Billy [name] MRN: [id number] DOB: [birth year]   Cancelled Treatment:    Reason Eval/Treat Not Completed: Fatigue/lethargy limiting ability to participate. Nurse reports patient has been up/down multiple times this AM and now exhausted. Will check back as schedule allows.  Camp Swift 07/21/2021, 10:44 AM  Jesse Sans OTR/L Acute Rehabilitation Services Pager: [phone] Office: [phone]

## 2021-07-21 NOTE — Progress Notes (Addendum)
[facility] Progress Note   Subjective:  He had 700 ml uop over 11/6 charted as with intermittent cath.  Last HD on 11/6 with 1.5 kg UF.  He doesn't wear oxygen at home and has been on 2-2.5 liters overnight - usually when not sleeping on a 1 liter per RN but she states he has sleep apnea.  RN states he's going down for tunn catheter today  Review of systems:  States some shortness of breath and wheezing Denies chest pain No n/v Had to get in/out cath - hadn't urinated before that.  Hasn't been an issue usually except around surgery in the past  Objective Vitals:   07/21/21 0200 07/21/21 0230 07/21/21 0300 07/21/21 0400  BP: (!) 97/49 119/68 (!) 104/52 (!) 123/51  Pulse: 72 78 74 78  Resp: (!) 23 (!) 27 (!) 22 (!) 23  Temp:      TempSrc:      SpO2: 98% 96% 100% 97%  Weight:      Height:       Physical Exam  General adult male in bed in no acute distress HEENT normocephalic atraumatic extraocular movements intact sclera anicteric Neck supple trachea midline Lungs clear to auscultation bilaterally normal work of breathing at rest  Heart S1S2 no rub Abdomen soft nontender nondistended Extremities no edema  Psych normal mood and affect Neuro alert and oriented x 3 provides hx and follows commands GU no foley  Access: Left IJ nontunneled catheter   Additional Objective Labs: Basic Metabolic Panel: Recent Labs  Lab 07/19/21 0313 07/20/21 0209 07/21/21 0055  NA 135 135 134*  K 4.2 4.4 4.0  CL 102 103 100  CO2 25 21* 24  GLUCOSE 191* 110* 77  BUN 53* 77* 59*  CREATININE 4.74* 6.36* 5.26*  CALCIUM 8.2* 8.2* 8.1*  PHOS 4.0 6.4* 6.0*   Liver Function Tests: Recent Labs  Lab 07/19/21 0313 07/20/21 0209 07/21/21 0055  ALBUMIN 2.3* 2.4* 2.3*   CBC: Recent Labs  Lab 07/16/21 0415 07/16/21 1330 07/17/21 0514 07/18/21 0151 07/19/21 0313 07/20/21 0209 07/21/21 0055  WBC 12.3*  --  15.2* 13.8* 15.7* 14.5* 11.6*  NEUTROABS 10.6*  --   --   --    --   --   --   HGB 7.1*   < > 8.4* 8.7* 8.2* 8.3* 7.7*  HCT 22.6*   < > 26.3* 27.0* 25.4* 26.2* 23.9*  MCV 97.8  --  95.3 96.4 97.3 97.4 94.8  PLT 222  --  273 267 319 398 381   < > = values in this interval not displayed.   Blood Culture    Component Value Date/Time   SDES TRACHEAL ASPIRATE 07/12/2021 0817   SPECREQUEST NONE 07/12/2021 0817   CULT  07/12/2021 0817    Normal respiratory flora-no Staph aureus or Pseudomonas seen Performed at [facility] [address], [place], [place] [address]    REPTSTATUS 07/14/2021 FINAL 07/12/2021 0817    Cardiac Enzymes: No results for input(s): CKTOTAL, CKMB, CKMBINDEX, TROPONINI in the last 168 hours. CBG: Recent Labs  Lab 07/20/21 1605 07/20/21 2020 07/21/21 0020 07/21/21 0356 07/21/21 0425  GLUCAP 103* 181* 87 63* 77   Iron Studies:  Recent Labs    07/19/21 1133  IRON 29*  TIBC 244*  FERRITIN 541*   @lablastinr3 @ Studies/Results: DG Abd 1 View  Result Date: 07/20/2021 CLINICAL DATA:  Follow-up ileus. EXAM: ABDOMEN - 1 VIEW COMPARISON:  07/18/2021 FINDINGS: Previously seen feeding tube  is no longer visualized. Gaseous distension stomach is noted. No evidence of dilated bowel loops. No definite radiopaque urinary calculi identified. IMPRESSION: Gaseous distension of stomach.  No evidence of dilated bowel loops. Electronically Signed   By: Marlaine Hind M.D.   On: 07/20/2021 09:47   DG Chest Port 1 View  Result Date: 07/20/2021 CLINICAL DATA:  Status post CABG EXAM: PORTABLE CHEST 1 VIEW COMPARISON:  07/18/2021 chest radiograph. FINDINGS: Intact sternotomy wires. Left internal jugular central venous catheter terminates over the left brachiocephalic vein and just to the right of midline, unchanged. Stable cardiomediastinal silhouette with borderline mild cardiomegaly. No pneumothorax. Possible trace left pleural effusion. No right pleural effusion. No overt pulmonary edema. Stable mild elevation of the left hemidiaphragm  with left basilar atelectasis. IMPRESSION: 1. Stable borderline mild cardiomegaly without overt pulmonary edema. 2. Possible trace left pleural effusion. 3. Stable mild elevation of the left hemidiaphragm with left basilar atelectasis. Electronically Signed   By: Ilona Sorrel M.D.   On: 07/20/2021 07:14   Medications:  sodium chloride Stopped (07/13/21 0234)   sodium chloride     sodium chloride     albumin human 12.5 g (07/08/21 1827)   And   sodium chloride Stopped (07/08/21 2051)    ceFAZolin (ANCEF) IV     heparin 600 Units/hr (07/21/21 0458)   lactated ringers Stopped (07/16/21 0848)   promethazine (PHENERGAN) injection (IM or IVPB) Stopped (07/09/21 1628)    ALPRAZolam  1 mg Oral TID   amiodarone  200 mg Oral BID   Followed by   Derrill Memo ON 07/23/2021] amiodarone  200 mg Oral Daily   aspirin  81 mg Oral Daily   Or   aspirin EC  81 mg Oral Daily   atorvastatin  80 mg Oral Daily   B-complex with vitamin C  1 tablet Oral Daily   carvedilol  3.125 mg Oral BID WC   chlorhexidine  15 mL Mouth Rinse BID   Chlorhexidine Gluconate Cloth  6 each Topical Daily   Chlorhexidine Gluconate Cloth  6 each Topical Q0600   clopidogrel  75 mg Oral Daily   feeding supplement (PROSource TF)  45 mL Per Tube QID   hydrALAZINE  12.5 mg Oral TID   insulin aspart  3-9 Units Subcutaneous Q4H   insulin aspart  6 Units Subcutaneous Q4H   insulin detemir  25 Units Subcutaneous BID   isosorbide dinitrate  10 mg Oral TID   mouth rinse  15 mL Mouth Rinse q12n4p   multivitamin with minerals  1 tablet Oral Daily   pantoprazole  40 mg Oral Daily   sertraline  50 mg Oral Daily   sodium chloride flush  3 mL Intravenous Q12H   traZODone  100 mg Oral QHS     Assessment/Plan: 55yo M DM, HTN, A fib, CKD currently being followed for severe AKI s/p CABG x 4 and MAZE.   # Severe AKI on CKD 3b:   - Baseline 1.6 (Followed by Dr. Moshe Cipro outpt), was 1.9 prior to CABG  - AKI in setting of hemodynamic instability  post op likely ATN but cholesterol embolic injury possible.  Initiated CRRT 10/27 for UF with ~anuria and volume overload.  Off of CRRT on 11/4.   ---------------------- - Assess HD needs daily - tentatively plan for HD on 11/8 - IR has been consulted to switch temporary to tunneled HD cath when able - check bladder scan as below - lasix 80 mg IV once  # Anemia: Hemoglobin  stable in the 8s.  iron deficient. Ferrelecit x 2 doses ordered.  Transfuse as needed.  Start ESA - aranesp 40 mcg on 11/7 ordered  # Urinary retention - check post-void bladder scan and place foley if over 250 mL retained  # Hyponatremia: Has improved with volume removal.  # CKD stage 3b - predates admission - Baseline Cr 1.6 and follows with Dr. Moshe Cipro outpatient  # CAD s/p CABG  # A fib: amiodarone, anticoag per primary  # DM: per primary  # Hyperphosphatemia - would transition to renal diet - currently NPO - s/p HD - previously controlled - may be able to stop binder soon - [place]lo with meals for now   # Shock: No longer requiring norepinephrine or dobutamine.  Resolved  # Pneumonia: s/p treatment   Claudia Desanctis, MD 07/21/2021 6:14 AM

## 2021-07-21 NOTE — Progress Notes (Signed)
Physical Therapy Treatment Patient Details Name: STONY STEGMANN MRN: [id number] DOB: [birth year] Today's Date: 07/21/2021   History of Present Illness Pt adm 10/24 with STEMI. Pt initially refused cardiac stenting but later changed his mind and underwnet LHC with PCI. Pt with severe 3V CAD and underwent CABG x 4  and MAZE on 10/25. Pt extubated 10/26. Reintubated 10/27. Extubated 11/1. CRRT initiated 10/27. PMH - visually impaired, afib, HTN, DM, neuropathy, anxiety, rt toe amputation    PT Comments    Pt is making excellent progress with mobility. Able to amb in hallway with assist. Pt in good spirits and remains motivated to return to independence.    Recommendations for follow up therapy are one component of a multi-disciplinary discharge planning process, led by the attending physician.  Recommendations may be updated based on patient status, additional functional criteria and insurance authorization.  Follow Up Recommendations  Skilled nursing-short term rehab (<3 hours/day)     Assistance Recommended at Discharge Intermittent Supervision/Assistance  Equipment Recommendations  Rolling walker (2 wheels);Rollator (4 wheels) (To be determined at SNF)    Recommendations for Other Services       Precautions / Restrictions Precautions Precautions: Fall;Other (comment);Sternal Precaution Comments: visually impaired Restrictions Weight Bearing Restrictions: Yes (sternal)     Mobility  Bed Mobility Overal bed mobility: Needs Assistance Bed Mobility: Supine to Sit     Supine to sit: HOB elevated;Min assist     General bed mobility comments: Assist to elevate trunk into sitting    Transfers Overall transfer level: Needs assistance Equipment used: Rolling walker (2 wheels) Transfers: Sit to/from Stand Sit to Stand: +2 physical assistance;Min assist           General transfer comment: Assist to bring hips up and for balance. Verbal cues to use rocking momentum and to  bring hands back to knees prior to sitting down.    Ambulation/Gait Ambulation/Gait assistance: Min assist Gait Distance (Feet): 65 Feet (x 2) Assistive device: Rolling walker (2 wheels) Gait Pattern/deviations: Step-through pattern;Decreased stride length;Narrow base of support;Drifts right/left Gait velocity: decr Gait velocity interpretation: 1.31 - 2.62 ft/sec, indicative of limited community ambulator   General Gait Details: Assist for balance and stability.   Stairs             Wheelchair Mobility    Modified Rankin (Stroke Patients Only)       Balance Overall balance assessment: Needs assistance Sitting-balance support: Feet supported;No upper extremity supported Sitting balance-Leahy Scale: Fair     Standing balance support: Bilateral upper extremity supported;Reliant on assistive device for balance Standing balance-Leahy Scale: Poor Standing balance comment: walker and min assist for static standing                            Cognition Arousal/Alertness: Awake/alert Behavior During Therapy: Anxious;WFL for tasks assessed/performed Overall Cognitive Status: Within Functional Limits for tasks assessed                                          Exercises      General Comments General comments (skin integrity, edema, etc.): VSS on RA      Pertinent Vitals/Pain Pain Assessment: No/denies pain    Home Living  Prior Function            PT Goals (current goals can now be found in the care plan section) Acute Rehab PT Goals Patient Stated Goal: get home for Thanksgiving PT Goal Formulation: With patient Time For Goal Achievement: 08/04/21 Potential to Achieve Goals: Good Progress towards PT goals: Goals met and updated - see care plan    Frequency    Min 2X/week      PT Plan Current plan remains appropriate;Frequency needs to be updated    Co-evaluation               AM-PAC PT "6 Clicks" Mobility   Outcome Measure  Help needed turning from your back to your side while in a flat bed without using bedrails?: A Little Help needed moving from lying on your back to sitting on the side of a flat bed without using bedrails?: A Little Help needed moving to and from a bed to a chair (including a wheelchair)?: Total Help needed standing up from a chair using your arms (e.g., wheelchair or bedside chair)?: Total Help needed to walk in hospital room?: A Little Help needed climbing 3-5 steps with a railing? : Total 6 Click Score: 12    End of Session Equipment Utilized During Treatment: Gait belt Activity Tolerance: Patient tolerated treatment well Patient left: in chair;with call bell/phone within reach;with chair alarm set;with family/visitor present Nurse Communication: Mobility status PT Visit Diagnosis: Other abnormalities of gait and mobility (R26.89);Muscle weakness (generalized) (M62.81)     Time: 7001-7494 PT Time Calculation (min) (ACUTE ONLY): 16 min  Charges:  $Gait Training: 8-22 mins                     [place] Pager [phone] Office [place] 07/21/2021, 4:25 PM

## 2021-07-21 NOTE — Progress Notes (Signed)
Pt sister [name] contacted this RN to voice concerns that pt has not had HD since Saturday and the permanent HD cath has not been placed. She asked me to document this and pass this information on to oncoming RN in the am.

## 2021-07-21 NOTE — Progress Notes (Signed)
Attempted to call report to nurse at 1100.

## 2021-07-21 NOTE — NC FL2 (Signed)
[place] LEVEL OF CARE SCREENING TOOL     IDENTIFICATION  Patient Name: Billy [name] Birthdate: 1966-03-02 Sex: male Admission Date (Current Location): 07/07/2021  [facility] and [place] Number:  Herbalist and Address:  The [place], [place] [address], [place], [place] [address]      Provider Number: [id number]  Attending Physician Name and Address:  Lajuana Matte, MD  Relative Name and Phone Number:       Current Level of Care: Hospital Recommended Level of Care: [place] Prior Approval Number:    Date Approved/Denied:   PASRR Number: [id number]  Discharge Plan: SNF    Current Diagnoses: Patient Active Problem List   Diagnosis Date Noted   Pressure injury of skin 07/18/2021   Cardiogenic shock ([place])    AKI (acute kidney injury) ([place])    Acute respiratory failure with hypoxia (HCC)    S/P CABG x 4 07/08/2021   STEMI (ST elevation myocardial infarction) ([place]) 07/07/2021   Exertional dyspnea 06/30/2021   SVT (supraventricular tachycardia) ([place]) 06/30/2021   Chest pain 06/25/2021   Renal insufficiency 06/25/2021   Hypertensive urgency 06/25/2021   S/P amputation of lesser toe, right ([place]) 07/13/2017   Chronic osteomyelitis of toe, right ([place])    Anxiety and depression 06/30/2017   Left shoulder pain 06/30/2017   Marijuana dependence ([place]) 06/30/2017   Diabetes mellitus with ulcer of toe ([place]) 06/05/2012   Hypertension 06/05/2012    Orientation RESPIRATION BLADDER Height & Weight     Self, Time, Situation, Place  O2 (3L) Continent Weight: 245 lb 6 oz (111.3 kg) Height:  6\' 2"  (188 cm)  BEHAVIORAL SYMPTOMS/MOOD NEUROLOGICAL BOWEL NUTRITION STATUS      Incontinent Diet (See DC Summary)  AMBULATORY STATUS COMMUNICATION OF NEEDS Skin   Extensive Assist Verbally PU Stage and Appropriate Care                       Personal Care Assistance Level of Assistance  Bathing, Feeding, Dressing  Bathing Assistance: Limited assistance Feeding assistance: Independent Dressing Assistance: Limited assistance     Functional Limitations Info  Hearing, Speech, Sight Sight Info: Impaired Hearing Info: Adequate Speech Info: Adequate    SPECIAL CARE FACTORS FREQUENCY  PT (By licensed PT), OT (By licensed OT)     PT Frequency: 5x/week OT Frequency: 5x/week            Contractures Contractures Info: Not present    Additional Factors Info  Code Status, Allergies, Insulin Sliding Scale, Psychotropic Code Status Info: Full Allergies Info: No Known Allergies Psychotropic Info: Please See DC Summary Insulin Sliding Scale Info: See Med List       Current Medications (07/21/2021):  This is the current hospital active medication list Current Facility-Administered Medications  Medication Dose Route Frequency Provider Last Rate Last Admin   0.9 %  sodium chloride infusion  250 mL Intravenous Continuous Gold, Wayne E, PA-C   Stopped at 07/13/21 0234   0.9 %  sodium chloride infusion  100 mL Intravenous PRN Reesa Chew, MD       0.9 %  sodium chloride infusion  100 mL Intravenous PRN Reesa Chew, MD       0.9 %  sodium chloride infusion  250 mL Intravenous PRN Lightfoot, Lucile Crater, MD       acetaminophen (TYLENOL) tablet 650 mg  650 mg Oral Q6H PRN [name], MD   650 mg  at 07/16/21 1021   albumin human 25 % solution 12.5 g  12.5 g Intravenous Q15 min PRN Lajuana Matte, MD 60 mL/hr at 07/08/21 1827 12.5 g at 07/08/21 1827   And   sodium chloride 0.9 % bolus 250 mL  250 mL Intravenous Q15 min PRN Lajuana Matte, MD   Stopped at 07/08/21 2051   albuterol (PROVENTIL) (2.5 MG/3ML) 0.083% nebulizer solution 2.5 mg  2.5 mg Nebulization Q4H PRN Lajuana Matte, MD   2.5 mg at 07/20/21 2312   ALPRAZolam (XANAX) tablet 1 mg  1 mg Oral TID Jennelle Human B, NP   1 mg at 07/21/21 1004   alteplase (CATHFLO ACTIVASE) injection 2 mg  2 mg Intracatheter Once PRN  Reesa Chew, MD       amiodarone (PACERONE) tablet 200 mg  200 mg Oral BID Bensimhon, Shaune Pascal, MD   200 mg at 07/21/21 1004   Followed by   Derrill Memo ON 07/23/2021] amiodarone (PACERONE) tablet 200 mg  200 mg Oral Daily Bensimhon, Shaune Pascal, MD       aspirin chewable tablet 81 mg  81 mg Oral Daily Bensimhon, Shaune Pascal, MD   81 mg at 07/20/21 0941   Or   aspirin EC tablet 81 mg  81 mg Oral Daily Bensimhon, Shaune Pascal, MD   81 mg at 07/21/21 0956   atorvastatin (LIPITOR) tablet 80 mg  80 mg Oral Daily Bensimhon, Shaune Pascal, MD   80 mg at 07/21/21 8101   B-complex with vitamin C tablet 1 tablet  1 tablet Oral Daily Bensimhon, Shaune Pascal, MD   1 tablet at 07/21/21 1004   calcium acetate (PHOSLO) capsule 667 mg  667 mg Oral TID WC Claudia Desanctis, MD       carvedilol (COREG) tablet 3.125 mg  3.125 mg Oral BID WC Larey Dresser, MD   3.125 mg at 07/21/21 0848   ceFAZolin (ANCEF) IVPB 2g/100 mL premix  2 g Intravenous Once [place], Roselyn Reef R, NP       chlorhexidine (PERIDEX) 0.12 % solution 15 mL  15 mL Mouth Rinse BID Lajuana Matte, MD   15 mL at 07/21/21 1005   Chlorhexidine Gluconate Cloth 2 % PADS 6 each  6 each Topical Daily Lajuana Matte, MD   6 each at 07/20/21 1354   Chlorhexidine Gluconate Cloth 2 % PADS 6 each  6 each Topical Q0600 Reesa Chew, MD   6 each at 07/21/21 0912   clopidogrel (PLAVIX) tablet 75 mg  75 mg Oral Daily Bensimhon, Shaune Pascal, MD   75 mg at 07/21/21 0959   [facility], Patient & Family Education   Does not apply Once Lajuana Matte, MD       docusate (COLACE) 50 MG/5ML liquid 100 mg  100 mg Per Tube BID PRN Joette Catching, PA-C       feeding supplement (PROSource TF) liquid 45 mL  45 mL Per Tube QID Lajuana Matte, MD   45 mL at 07/18/21 2231   ferric gluconate (FERRLECIT) 250 mg in sodium chloride 0.9 % 250 mL IVPB  250 mg Intravenous Daily Claudia Desanctis, MD 135 mL/hr at 07/21/21 1120 250 mg at 07/21/21 1120   heparin  ADULT infusion 100 units/mL (25000 units/251mL)  600 Units/hr Intravenous Continuous Bensimhon, Shaune Pascal, MD 6 mL/hr at 07/21/21 1000 600 Units/hr at 07/21/21 1000   heparin injection 1,000 Units  1,000 Units Dialysis PRN Joylene Grapes,  Shaune Pollack, MD   1,000 Units at 07/20/21 1736   hydrALAZINE (APRESOLINE) tablet 12.5 mg  12.5 mg Oral TID Joette Catching, PA-C   12.5 mg at 07/21/21 0959   insulin aspart (novoLOG) injection 3-9 Units  3-9 Units Subcutaneous Q4H Lajuana Matte, MD   3 Units at 07/21/21 0849   insulin aspart (novoLOG) injection 6 Units  6 Units Subcutaneous Q4H [name], MD   6 Units at 07/20/21 2046   insulin detemir (LEVEMIR) injection 25 Units  25 Units Subcutaneous BID Bensimhon, Shaune Pascal, MD   25 Units at 07/20/21 2047   isosorbide dinitrate (ISORDIL) tablet 10 mg  10 mg Oral TID Larey Dresser, MD   10 mg at 07/21/21 1004   lactated ringers infusion   Intravenous Continuous Gold, Wayne E, PA-C   Stopped at 07/16/21 0848   lidocaine (PF) (XYLOCAINE) 1 % injection 5 mL  5 mL Intradermal PRN Reesa Chew, MD       lidocaine-prilocaine (EMLA) cream 1 application  1 application Topical PRN Reesa Chew, MD       MEDLINE mouth rinse  15 mL Mouth Rinse q12n4p Lajuana Matte, MD   15 mL at 07/20/21 1751   metoprolol tartrate (LOPRESSOR) injection 2.5-5 mg  2.5-5 mg Intravenous Q2H PRN Jadene Pierini E, PA-C   5 mg at 07/16/21 2236   multivitamin with minerals tablet 1 tablet  1 tablet Oral Daily Lajuana Matte, MD   1 tablet at 07/21/21 0956   ondansetron (ZOFRAN) injection 4 mg  4 mg Intravenous Q6H PRN Jadene Pierini E, PA-C   4 mg at 07/20/21 1452   oxyCODONE (Oxy IR/ROXICODONE) immediate release tablet 5-10 mg  5-10 mg Oral Q3H PRN Jadene Pierini E, PA-C   10 mg at 07/12/21 2112   pantoprazole (PROTONIX) EC tablet 40 mg  40 mg Oral Daily Bensimhon, Shaune Pascal, MD   40 mg at 07/21/21 1004   pentafluoroprop-tetrafluoroeth (GEBAUERS) aerosol 1 application  1  application Topical PRN Reesa Chew, MD       polyethylene glycol (MIRALAX / GLYCOLAX) packet 17 g  17 g Per Tube Daily PRN Joette Catching, PA-C       promethazine (PHENERGAN) 12.5 mg in sodium chloride 0.9 % 50 mL IVPB  12.5 mg Intravenous Q6H PRN Lajuana Matte, MD   Stopped at 07/09/21 1628   sennosides (SENOKOT) 8.8 MG/5ML syrup 10 mL  10 mL Per Tube QHS PRN Joette Catching, PA-C       sertraline (ZOLOFT) tablet 50 mg  50 mg Oral Daily Agarwala, Einar Grad, MD   50 mg at 07/21/21 1004   simethicone (MYLICON) 40 FG/1.8EX suspension 40 mg  40 mg Oral QID PRN [name], MD   40 mg at 07/17/21 1543   sodium chloride flush (NS) 0.9 % injection 3 mL  3 mL Intravenous Q12H Gold, Wayne E, PA-C   3 mL at 07/21/21 1000   sodium chloride flush (NS) 0.9 % injection 3 mL  3 mL Intravenous PRN Gold, Wayne E, PA-C       sodium chloride flush (NS) 0.9 % injection 3 mL  3 mL Intravenous Q12H Lightfoot, Harrell O, MD   3 mL at 07/21/21 1000   sodium chloride flush (NS) 0.9 % injection 3 mL  3 mL Intravenous PRN Lajuana Matte, MD       traZODone (DESYREL) tablet 100 mg  100 mg Oral QHS Bensimhon, Shaune Pascal,  MD   100 mg at 07/20/21 2047     Discharge Medications: Please see discharge summary for a list of discharge medications.  Relevant Imaging Results:  Relevant Lab Results:   Additional Information SSN#: [id number]  Leotha Voeltz, LCSWA

## 2021-07-22 ENCOUNTER — Inpatient Hospital Stay (HOSPITAL_COMMUNITY): Payer: Medicare HMO

## 2021-07-22 DIAGNOSIS — N179 Acute kidney failure, unspecified: Secondary | ICD-10-CM | POA: Diagnosis not present

## 2021-07-22 HISTORY — PX: IR FLUORO GUIDE CV LINE RIGHT: IMG2283

## 2021-07-22 HISTORY — PX: IR US GUIDE VASC ACCESS RIGHT: IMG2390

## 2021-07-22 LAB — CBC
HCT: 22.9 % — ABNORMAL LOW (ref 39.0–52.0)
Hemoglobin: 7.5 g/dL — ABNORMAL LOW (ref 13.0–17.0)
MCH: 31 pg (ref 26.0–34.0)
MCHC: 32.8 g/dL (ref 30.0–36.0)
MCV: 94.6 fL (ref 80.0–100.0)
Platelets: 464 10*3/uL — ABNORMAL HIGH (ref 150–400)
RBC: 2.42 MIL/uL — ABNORMAL LOW (ref 4.22–5.81)
RDW: 13.1 % (ref 11.5–15.5)
WBC: 9.1 10*3/uL (ref 4.0–10.5)
nRBC: 0 % (ref 0.0–0.2)

## 2021-07-22 LAB — GLUCOSE, CAPILLARY
Glucose-Capillary: 145 mg/dL — ABNORMAL HIGH (ref 70–99)
Glucose-Capillary: 110 mg/dL — ABNORMAL HIGH (ref 70–99)
Glucose-Capillary: 99 mg/dL (ref 70–99)
Glucose-Capillary: 94 mg/dL (ref 70–99)
Glucose-Capillary: 178 mg/dL — ABNORMAL HIGH (ref 70–99)
Glucose-Capillary: 203 mg/dL — ABNORMAL HIGH (ref 70–99)

## 2021-07-22 LAB — HEPARIN LEVEL (UNFRACTIONATED): Heparin Unfractionated: <0.1 IU/mL — ABNORMAL LOW (ref 0.30–0.70)

## 2021-07-22 LAB — RENAL FUNCTION PANEL
Albumin: 2.3 g/dL — ABNORMAL LOW (ref 3.5–5.0)
Anion gap: 12 (ref 5–15)
BUN: 80 mg/dL — ABNORMAL HIGH (ref 6–20)
CO2: 22 mmol/L (ref 22–32)
Calcium: 8 mg/dL — ABNORMAL LOW (ref 8.9–10.3)
Chloride: 98 mmol/L (ref 98–111)
Creatinine, Ser: 6.5 mg/dL — ABNORMAL HIGH (ref 0.61–1.24)
GFR, Estimated: 9 mL/min — ABNORMAL LOW (ref >60)
Glucose, Bld: 140 mg/dL — ABNORMAL HIGH (ref 70–99)
Phosphorus: 7.7 mg/dL — ABNORMAL HIGH (ref 2.5–4.6)
Potassium: 4.4 mmol/L (ref 3.5–5.1)
Sodium: 132 mmol/L — ABNORMAL LOW (ref 135–145)

## 2021-07-22 LAB — COOXEMETRY PANEL
Carboxyhemoglobin: 1.8 % — ABNORMAL HIGH (ref 0.5–1.5)
Methemoglobin: 0.8 % (ref 0.0–1.5)
O2 Saturation: 63.7 %
Total hemoglobin: 8.5 g/dL — ABNORMAL LOW (ref 12.0–16.0)

## 2021-07-22 LAB — MAGNESIUM: Magnesium: 2.6 mg/dL — ABNORMAL HIGH (ref 1.7–2.4)

## 2021-07-22 MED ORDER — CARVEDILOL 3.125 MG PO TABS
3.1250 mg | ORAL_TABLET | Freq: Two times a day (BID) | ORAL | Status: DC
  Administered 2021-07-23 – 2021-07-31 (×17): 3.125 mg via ORAL
  Filled 2021-07-22 (×17): qty 1

## 2021-07-22 MED ORDER — AMIODARONE HCL 200 MG PO TABS
200.0000 mg | ORAL_TABLET | Freq: Two times a day (BID) | ORAL | Status: AC
  Administered 2021-07-22 (×2): 200 mg via ORAL
  Filled 2021-07-22 (×3): qty 1

## 2021-07-22 MED ORDER — AMIODARONE HCL 200 MG PO TABS
200.0000 mg | ORAL_TABLET | Freq: Every day | ORAL | Status: DC
  Administered 2021-07-23 – 2021-08-02 (×11): 200 mg via ORAL
  Filled 2021-07-22 (×12): qty 1

## 2021-07-22 MED ORDER — MIDAZOLAM HCL 2 MG/2ML IJ SOLN
INTRAMUSCULAR | Status: AC | PRN
  Administered 2021-07-22 (×2): 1 mg via INTRAVENOUS

## 2021-07-22 MED ORDER — CHLORHEXIDINE GLUCONATE CLOTH 2 % EX PADS
6.0000 | MEDICATED_PAD | Freq: Every day | CUTANEOUS | Status: DC
  Administered 2021-07-23 – 2021-07-24 (×2): 6 via TOPICAL

## 2021-07-22 MED ORDER — FENTANYL CITRATE (PF) 100 MCG/2ML IJ SOLN
INTRAMUSCULAR | Status: AC
  Filled 2021-07-22: qty 2

## 2021-07-22 MED ORDER — LIP MEDEX EX OINT
TOPICAL_OINTMENT | CUTANEOUS | Status: DC | PRN
  Administered 2021-07-22 – 2021-07-27 (×2): 75 via TOPICAL
  Filled 2021-07-22 (×2): qty 7

## 2021-07-22 MED ORDER — HEPARIN SODIUM (PORCINE) 1000 UNIT/ML IJ SOLN
INTRAMUSCULAR | Status: AC
  Administered 2021-07-22: 3.8 mL
  Filled 2021-07-22: qty 1

## 2021-07-22 MED ORDER — FENTANYL CITRATE (PF) 100 MCG/2ML IJ SOLN
INTRAMUSCULAR | Status: AC | PRN
  Administered 2021-07-22 (×2): 50 ug via INTRAVENOUS

## 2021-07-22 MED ORDER — HEPARIN (PORCINE) 25000 UT/250ML-% IV SOLN
600.0000 [IU]/h | INTRAVENOUS | Status: DC
  Administered 2021-07-22: 600 [IU]/h via INTRAVENOUS
  Filled 2021-07-22: qty 250

## 2021-07-22 MED ORDER — CARVEDILOL 3.125 MG PO TABS
3.1250 mg | ORAL_TABLET | Freq: Once | ORAL | Status: AC
Start: 2021-07-22 — End: 2021-07-22
  Administered 2021-07-22: 3.125 mg via ORAL
  Filled 2021-07-22: qty 1

## 2021-07-22 MED ORDER — CHLORHEXIDINE GLUCONATE CLOTH 2 % EX PADS: 6.0000 | MEDICATED_PAD | Freq: Every day | CUTANEOUS | Status: DC

## 2021-07-22 MED ORDER — DARBEPOETIN ALFA 40 MCG/0.4ML IJ SOSY: 40.0000 ug | PREFILLED_SYRINGE | INTRAMUSCULAR | Status: DC

## 2021-07-22 MED ORDER — LIDOCAINE-EPINEPHRINE 1 %-1:100000 IJ SOLN
INTRAMUSCULAR | Status: AC
  Administered 2021-07-22: 15 mL via SUBCUTANEOUS
  Filled 2021-07-22: qty 1

## 2021-07-22 MED ORDER — CEFAZOLIN SODIUM-DEXTROSE 2-4 GM/100ML-% IV SOLN
INTRAVENOUS | Status: AC
  Administered 2021-07-22: 2 g via INTRAVENOUS
  Filled 2021-07-22: qty 100

## 2021-07-22 MED ORDER — LIDOCAINE HCL URETHRAL/MUCOSAL 2 % EX GEL
1.0000 | Freq: Once | CUTANEOUS | Status: AC
Start: 2021-07-22 — End: 2021-07-22
  Administered 2021-07-22: 1 via URETHRAL
  Filled 2021-07-22: qty 6

## 2021-07-22 MED ORDER — MIDAZOLAM HCL 2 MG/2ML IJ SOLN
INTRAMUSCULAR | Status: AC
  Filled 2021-07-22: qty 2

## 2021-07-22 NOTE — Procedures (Signed)
Interventional Radiology Procedure:   Indications: AKI  Procedure: Tunneled dialysis catheter placement  Findings: Right jugular Palindrome, 23 cm, tip at SVC/RA junction.   Complications: No immediate complications noted.     EBL: Minimal  Plan: Right jugular dialysis catheter is ready to use.     Cobey Raineri R. Anselm Pancoast, MD  Pager: [phone]

## 2021-07-22 NOTE — Sedation Documentation (Signed)
Vital signs stable. 

## 2021-07-22 NOTE — TOC Progression Note (Addendum)
Transition of Care [place] [facility]) - Progression Note    Patient Details  Name: YAXIEL MINNIE MRN: [id number] Date of Birth: May 15, 1966  Transition of [facility]) CM/SW [place], [place] Phone Number: 07/22/2021, 10:01 AM  Clinical Narrative:    HF CSW spoke with Whitney at [facility] regarding bed availability for Mr. Pundt and Loree Fee reported needing more information before she can extend a bed offer. CSW faxed over MD progress notes and therapy notes to [facility] at [phone]. [facility] reported that they cannot extend a bed offer at this time and will let the CSW know if anything changes.  HF CSW reached out to the patient's sister, [name] to provide and update about rehab and that [facility] declined and [facility] doesn't have anything until possibly Monday but no guarantee. CSW presented bed offers from [facility] and [facility] reported to accept the bed offer from [facility] and to see how he progresses depending on when he is medically ready for discharge and if Mr. Couey is still in the hospital next week.  CSW reached out to [place] and spoke to [place] [phone] and she will start insurance authorization for Mr. Hemmer and will update the CSW when that comes back.   CSW received a call back from Mr. Penkala sister, [name] [phone] asking to see if her brother can be considered for CIR again since he has moved off of ICU and is making progress that maybe he won't need SNF after CIR. CSW reached out to CIR to see if they can possibly screen him again and awaiting a response.  CSW will continue to follow throughout discharge.        Expected Discharge Plan and Services                                                 Social Determinants of Health (SDOH) Interventions    Readmission Risk Interventions No flowsheet data found.  Maicie Vanderloop, MSW, [place] Heart Failure Social Worker

## 2021-07-22 NOTE — Progress Notes (Signed)
Attempted to bring pt down for tunneled HD catheter. Per pts nurse pt in dialysis. Will attempt to bring down later if IR schedule permits

## 2021-07-22 NOTE — Sedation Documentation (Signed)
Patient is resting

## 2021-07-22 NOTE — Sedation Documentation (Signed)
Patient is resting comfortably. 

## 2021-07-22 NOTE — Progress Notes (Signed)
[facility] Progress Note   Subjective:  He had 1.4 liters uop over 11/7 charted and got lasix 80 mg IV once.  Last HD on 11/6 with 1.5 kg UF.  Has been NPO since midnight for procedure - didn't get tunneled cath yet.  He was transferred to [facility].  He had a bladder scan with 480 mL - he got a foley  Review of systems:  denies shortness of breath Denies chest pain No n/v  Objective Vitals:   07/21/21 2119 07/21/21 2200 07/21/21 2300 07/22/21 0400  BP: 113/71 108/64 103/68 (!) 100/50  Pulse: 80 83 83 71  Resp: 17  16 20   Temp: 98.1 F (36.7 C)  98.7 F (37.1 C) 98.7 F (37.1 C)  TempSrc: Oral  Oral Oral  SpO2: 95% 92% 92% 90%  Weight:      Height:       Physical Exam   General adult male in bed in no acute distress HEENT normocephalic atraumatic extraocular movements intact sclera anicteric Neck supple trachea midline Lungs clear to auscultation bilaterally normal work of breathing at rest on room air Heart S1S2 no rub Abdomen soft nontender nondistended Extremities no edema  Psych normal mood and affect Neuro alert and oriented x 3 provides hx and follows commands GU no foley  Access: Left IJ nontunneled catheter   Additional Objective Labs: Basic Metabolic Panel: Recent Labs  Lab 07/20/21 0209 07/21/21 0055 07/22/21 0054  NA 135 134* 132*  K 4.4 4.0 4.4  CL 103 100 98  CO2 21* 24 22  GLUCOSE 110* 77 140*  BUN 77* 59* 80*  CREATININE 6.36* 5.26* 6.50*  CALCIUM 8.2* 8.1* 8.0*  PHOS 6.4* 6.0* 7.7*   Liver Function Tests: Recent Labs  Lab 07/20/21 0209 07/21/21 0055 07/22/21 0054  ALBUMIN 2.4* 2.3* 2.3*   CBC: Recent Labs  Lab 07/16/21 0415 07/16/21 1330 07/18/21 0151 07/19/21 0313 07/20/21 0209 07/21/21 0055 07/22/21 0054  WBC 12.3*   < > 13.8* 15.7* 14.5* 11.6* 9.1  NEUTROABS 10.6*  --   --   --   --   --   --   HGB 7.1*   < > 8.7* 8.2* 8.3* 7.7* 7.5*  HCT 22.6*   < > 27.0* 25.4* 26.2* 23.9* 22.9*  MCV 97.8   < > 96.4 97.3  97.4 94.8 94.6  PLT 222   < > 267 319 398 381 464*   < > = values in this interval not displayed.   Blood Culture    Component Value Date/Time   SDES TRACHEAL ASPIRATE 07/12/2021 0817   SPECREQUEST NONE 07/12/2021 0817   CULT  07/12/2021 0817    Normal respiratory flora-no Staph aureus or Pseudomonas seen Performed at [facility] [address], [place], [place] [address]    REPTSTATUS 07/14/2021 FINAL 07/12/2021 0817    Cardiac Enzymes: No results for input(s): CKTOTAL, CKMB, CKMBINDEX, TROPONINI in the last 168 hours. CBG: Recent Labs  Lab 07/21/21 1445 07/21/21 1624 07/21/21 2118 07/22/21 0251 07/22/21 0600  GLUCAP 119* 130* 154* 145* 110*   Iron Studies:  Recent Labs    07/19/21 1133  IRON 29*  TIBC 244*  FERRITIN 541*   @lablastinr3 @ Studies/Results: DG Abd 1 View  Result Date: 07/20/2021 CLINICAL DATA:  Follow-up ileus. EXAM: ABDOMEN - 1 VIEW COMPARISON:  07/18/2021 FINDINGS: Previously seen feeding tube is no longer visualized. Gaseous distension stomach is noted. No evidence of dilated bowel loops. No definite radiopaque urinary calculi identified.  IMPRESSION: Gaseous distension of stomach.  No evidence of dilated bowel loops. Electronically Signed   By: Marlaine Hind M.D.   On: 07/20/2021 09:47   Medications:  sodium chloride Stopped (07/13/21 0234)   sodium chloride     sodium chloride     sodium chloride     albumin human 12.5 g (07/08/21 1827)   And   sodium chloride Stopped (07/08/21 2051)    ceFAZolin (ANCEF) IV     ferric gluconate (FERRLECIT) IVPB Stopped (07/21/21 1323)   heparin 600 Units/hr (07/22/21 0523)   lactated ringers Stopped (07/16/21 0848)   promethazine (PHENERGAN) injection (IM or IVPB) Stopped (07/09/21 1628)    ALPRAZolam  1 mg Oral TID   amiodarone  200 mg Oral BID   Followed by   Derrill Memo ON 07/23/2021] amiodarone  200 mg Oral Daily   aspirin  81 mg Oral Daily   Or   aspirin EC  81 mg Oral Daily   atorvastatin  80 mg  Oral Daily   B-complex with vitamin C  1 tablet Oral Daily   calcium acetate  667 mg Oral TID WC   carvedilol  3.125 mg Oral BID WC   chlorhexidine  15 mL Mouth Rinse BID   [START ON 07/23/2021] Chlorhexidine Gluconate Cloth  6 each Topical Daily   clopidogrel  75 mg Oral Daily   [place] [facility], Patient & Family Education   Does not apply Once   feeding supplement (PROSource TF)  45 mL Per Tube QID   hydrALAZINE  12.5 mg Oral TID   insulin aspart  3-9 Units Subcutaneous TID WC & HS   insulin detemir  20 Units Subcutaneous BID   isosorbide dinitrate  10 mg Oral TID   mouth rinse  15 mL Mouth Rinse q12n4p   multivitamin with minerals  1 tablet Oral Daily   pantoprazole  40 mg Oral Daily   sertraline  50 mg Oral Daily   sodium chloride flush  3 mL Intravenous Q12H   sodium chloride flush  3 mL Intravenous Q12H   traZODone  100 mg Oral QHS     Assessment/Plan: 55yo M DM, HTN, A fib, CKD currently being followed for severe AKI s/p CABG x 4 and MAZE.   # Severe AKI on CKD 3b:   - Baseline 1.6 (Followed by Dr. Moshe Cipro outpt), was 1.9 prior to CABG  - AKI in setting of hemodynamic instability post op likely ATN but cholesterol embolic injury possible.  Initiated CRRT 10/27 for UF with ~anuria and volume overload.  Off of CRRT on 11/4 then iHD on 11/6.   ---------------------- - HD today - increased urine output is encouraging though  - IR has been consulted to switch temporary to tunneled HD cath when able - Continue Foley catheter   # Anemia: iron deficient. Ferrelecit x 2 doses ordered.  Transfuse as needed.  aranesp 40 mcg given on 11/7 and ordered for every Monday  # Urinary retention - Foley catheter in place for retention on two bladder scans  # Hyponatremia: Has improved with volume removal/RRT  # CKD stage 3b - predates admission - Baseline Cr 1.6 and follows with Dr. Moshe Cipro outpatient  # CAD s/p CABG  # A fib: amiodarone, anticoagulation per  primary  # DM: per primary  # Hyperphosphatemia - would transition to renal diet when taking PO again - currently NPO for procedure - for HD  - previously controlled - may be able to stop binder  soon - phoslo with meals for now   # Shock: No longer requiring norepinephrine or dobutamine.  Resolved  # Pneumonia: s/p treatment   Claudia Desanctis, MD 07/22/2021 6:37 AM

## 2021-07-22 NOTE — Sedation Documentation (Signed)
Pt prepped for procedure at this time. Pt is awake alert and oriented x4, consent signed, pt is NPO. Pt is very anxious. Pt asked that I call his sister [name] to update her that he is in radiology for procedure. I called pt sister; she was appreciative of the call. Vitals stable at this time, will continue to monitor.

## 2021-07-22 NOTE — Progress Notes (Signed)
[address]       [employer] [id number]             [phone]      14 Days Post-Op Procedure(s) (LRB): CORONARY ARTERY BYPASS GRAFTING (CABG) TIMES FOUR USING LEFT INTERNAL MAMMARY ARTERY, GREATER SAPHENOUS VEIN HARVESTED ENDOSCOPICALLY, AND LEFT RADIAL ARTERY HARVESTED OPEN (N/A) TRANSESOPHAGEAL ECHOCARDIOGRAM (TEE) (N/A) RADIAL ARTERY HARVEST (Left) ENDOVEIN HARVEST OF GREATER SAPHENOUS VEIN (Right) MAZE Subjective: Generally feels better overall  Objective: Vital signs in last 24 hours: Temp:  [98.1 F (36.7 C)-98.9 F (37.2 C)] 98.7 F (37.1 C) (11/08 0400) Pulse Rate:  [63-86] 71 (11/08 0400) Cardiac Rhythm: Normal sinus rhythm (11/07 1912) Resp:  [16-20] 20 (11/08 0400) BP: (100-130)/(50-71) 100/50 (11/08 0400) SpO2:  [90 %-99 %] 90 % (11/08 0400) Weight:  [213 kg] 108 kg (11/08 0400)  Hemodynamic parameters for last 24 hours:    Intake/Output from previous day: 11/07 0701 - 11/08 0700 In: 779.5 [P.O.:360; I.V.:149.5; IV Piggyback:270] Out: 1400 [Urine:1400] Intake/Output this shift: No intake/output data recorded.  General appearance: alert, cooperative, and no distress Heart: regular rate and rhythm Lungs: clear antero-lat, currently on dialysis Abdomen: soft, non tender or distended Extremities: no edema Wound: incis healing well, left arm n/v intact  Lab Results: Recent Labs    07/21/21 0055 07/22/21 0054  WBC 11.6* 9.1  HGB 7.7* 7.5*  HCT 23.9* 22.9*  PLT 381 464*   BMET:  Recent Labs    07/21/21 0055 07/22/21 0054  NA 134* 132*  K 4.0 4.4  CL 100 98  CO2 24 22  GLUCOSE 77 140*  BUN 59* 80*  CREATININE 5.26* 6.50*  CALCIUM 8.1* 8.0*    PT/INR: No results for input(s): LABPROT, INR in the last 72 hours. ABG    Component Value Date/Time   PHART 7.459 (H) 07/16/2021 1330   HCO3 24.8 07/16/2021 1330   TCO2 26 07/16/2021 1330   ACIDBASEDEF 9.0 (H) 07/10/2021 0303   O2SAT 68.6 07/21/2021 0705   CBG (last 3)   Recent Labs    07/21/21 2118 07/22/21 0251 07/22/21 0600  GLUCAP 154* 145* 110*    Meds Scheduled Meds:  ALPRAZolam  1 mg Oral TID   amiodarone  200 mg Oral BID   Followed by   Derrill Memo ON 07/23/2021] amiodarone  200 mg Oral Daily   aspirin  81 mg Oral Daily   Or   aspirin EC  81 mg Oral Daily   atorvastatin  80 mg Oral Daily   B-complex with vitamin C  1 tablet Oral Daily   calcium acetate  667 mg Oral TID WC   carvedilol  3.125 mg Oral BID WC   chlorhexidine  15 mL Mouth Rinse BID   [START ON 07/23/2021] Chlorhexidine Gluconate Cloth  6 each Topical Daily   clopidogrel  75 mg Oral Daily   [place], Patient & Family Education   Does not apply Once   [START ON 07/28/2021] darbepoetin (ARANESP) injection - NON-DIALYSIS  40 mcg Subcutaneous Q Mon-1800   feeding supplement (PROSource TF)  45 mL Per Tube QID   hydrALAZINE  12.5 mg Oral TID   insulin aspart  3-9 Units Subcutaneous TID WC & HS   insulin detemir  20 Units Subcutaneous BID   isosorbide dinitrate  10 mg Oral TID   mouth rinse  15 mL Mouth Rinse q12n4p   multivitamin with minerals  1 tablet Oral Daily   pantoprazole  40 mg Oral Daily   sertraline  50 mg Oral Daily   sodium chloride flush  3 mL Intravenous Q12H   sodium chloride flush  3 mL Intravenous Q12H   traZODone  100 mg Oral QHS   Continuous Infusions:  sodium chloride Stopped (07/13/21 0234)   sodium chloride     sodium chloride     sodium chloride     albumin human 12.5 g (07/08/21 1827)   And   sodium chloride Stopped (07/08/21 2051)    ceFAZolin (ANCEF) IV     ferric gluconate (FERRLECIT) IVPB Stopped (07/21/21 1323)   heparin 600 Units/hr (07/22/21 0523)   lactated ringers Stopped (07/16/21 0848)   promethazine (PHENERGAN) injection (IM or IVPB) Stopped (07/09/21 1628)   PRN Meds:.sodium chloride, sodium chloride, sodium chloride, acetaminophen, albumin human **AND** sodium chloride, albuterol, alteplase, docusate, heparin,  lidocaine (PF), lidocaine-prilocaine, metoprolol tartrate, ondansetron (ZOFRAN) IV, oxyCODONE, pentafluoroprop-tetrafluoroeth, polyethylene glycol, promethazine (PHENERGAN) injection (IM or IVPB), sennosides, simethicone, sodium chloride flush, sodium chloride flush  Xrays DG Abd 1 View  Result Date: 07/20/2021 CLINICAL DATA:  Follow-up ileus. EXAM: ABDOMEN - 1 VIEW COMPARISON:  07/18/2021 FINDINGS: Previously seen feeding tube is no longer visualized. Gaseous distension stomach is noted. No evidence of dilated bowel loops. No definite radiopaque urinary calculi identified. IMPRESSION: Gaseous distension of stomach.  No evidence of dilated bowel loops. Electronically Signed   By: Marlaine Hind M.D.   On: 07/20/2021 09:47    Assessment/Plan: S/P Procedure(s) (LRB): CORONARY ARTERY BYPASS GRAFTING (CABG) TIMES FOUR USING LEFT INTERNAL MAMMARY ARTERY, GREATER SAPHENOUS VEIN HARVESTED ENDOSCOPICALLY, AND LEFT RADIAL ARTERY HARVESTED OPEN (N/A) TRANSESOPHAGEAL ECHOCARDIOGRAM (TEE) (N/A) RADIAL ARTERY HARVEST (Left) ENDOVEIN HARVEST OF GREATER SAPHENOUS VEIN (Right) MAZE  1 afeb, VSS, sinus rhythm on amio, co-ox pending, on heparin gtt, asa/plavix- AHF managing cardiology meds titration 2 sats good on RA, 90-95% 3 increased UOP,retention on bladder scans- foley in place on iHD- nephrology managing renal issues 4 Anemia- multifactorial- nephrology managing Iron/aranesp, Hgb 7.5 , close to transfusion threshold- tolerating pretty well clinically 5 no leukocytosis- completed cefipime for pulm coverage 6 TOC involved with SNF bed search, getting therapies 7 cortrak out, advancing diet    LOS: 15 days    Hosie Giovanni PA-C Pager [phone] 07/22/2021

## 2021-07-22 NOTE — Procedures (Signed)
Seen and examined on dialysis.  Procedure supervised.  Blood pressure 118/75 and HR 78 on HD.  Tolerating goal.  Nontunneled catheter in use.  Heart failure team at bedside  Claudia Desanctis, MD 07/22/2021 9:01 AM

## 2021-07-22 NOTE — Sedation Documentation (Signed)
Pt tolerated procedure very well. Report called to floor RN.  Totals: 25 mins 180mcg fentanyl 2mg  versed 2g ancef

## 2021-07-22 NOTE — Progress Notes (Addendum)
Patient ID: Billy [name], male   DOB: 1966/07/21, 54 y.o.   MRN: [id number]     Advanced Heart Failure Rounding Note  PCP-Cardiologist: None   Subjective:    10/25: S/P CABG x4 with MAZE . Intra Op EF 20%.  10/27: Intubated. Hypotensive. Placed on Norepi + Vaso. Recurrent A fib with amio increased to 60 mg/hr.  10/29: Co-ox low, DBA added 11/1: Extubated. AF with RVR, amio gtt restarted 11/03: Dobutamine off 11/6: Started iHD   Yesterday foley catheter placed for urinary retention. Made 1400 cc urine.  Creatinine 6.4>5.3>6.5   On low-dose heparin. No bleeding. Hgb trending down 8.3>7.7 >7.5   Feels ok. Denies SOB.    Objective:   Weight Range: (P) 108.9 kg Body mass index is 30.82 kg/m (pended).   Vital Signs:   Temp:  [97.1 F (36.2 C)-98.9 F (37.2 C)] (P) 97.1 F (36.2 C) (11/08 0723) Pulse Rate:  [63-83] 78 (11/08 0830) Resp:  [16-20] (P) 17 (11/08 0723) BP: (100-135)/(50-80) 118/75 (11/08 0830) SpO2:  [90 %-98 %] 90 % (11/08 0400) Weight:  [108 kg-108.9 kg] (P) 108.9 kg (11/08 0723) Last BM Date: 07/21/21  Weight change: Filed Weights   07/21/21 0500 07/22/21 0400 07/22/21 0723  Weight: 111.3 kg 108 kg (P) 108.9 kg    Intake/Output:   Intake/Output Summary (Last 24 hours) at 07/22/2021 0850 Last data filed at 07/22/2021 0523 Gross per 24 hour  Intake 761.31 ml  Output 1200 ml  Net -438.69 ml      Physical Exam  In HD unit.  General:  In bed.  No resp difficulty HEENT: normal Neck: supple. no JVD. Carotids 2+ bilat; no bruits. No lymphadenopathy or thryomegaly appreciated.RIJ HD  Cor: PMI nondisplaced. Regular rate & rhythm. No rubs, gallops or murmurs. Lungs: clear Abdomen: soft, nontender, nondistended. No hepatosplenomegaly. No bruits or masses. Good bowel sounds. Extremities: no cyanosis, clubbing, rash, edema Neuro: alert & orientedx3, cranial nerves grossly intact. moves all 4 extremities w/o difficulty. Affect pleasant GU: Foley    Telemetry  NSR 70s   Labs    CBC Recent Labs    07/21/21 0055 07/22/21 0054  WBC 11.6* 9.1  HGB 7.7* 7.5*  HCT 23.9* 22.9*  MCV 94.8 94.6  PLT 381 169*   Basic Metabolic Panel Recent Labs    07/21/21 0055 07/22/21 0054  NA 134* 132*  K 4.0 4.4  CL 100 98  CO2 24 22  GLUCOSE 77 140*  BUN 59* 80*  CREATININE 5.26* 6.50*  CALCIUM 8.1* 8.0*  MG 2.4 2.6*  PHOS 6.0* 7.7*   Liver Function Tests Recent Labs    07/21/21 0055 07/22/21 0054  ALBUMIN 2.3* 2.3*   No results for input(s): LIPASE, AMYLASE in the last 72 hours. Cardiac Enzymes No results for input(s): CKTOTAL, CKMB, CKMBINDEX, TROPONINI in the last 72 hours.  BNP: BNP (last 3 results) No results for input(s): BNP in the last 8760 hours.  ProBNP (last 3 results) No results for input(s): PROBNP in the last 8760 hours.   D-Dimer No results for input(s): DDIMER in the last 72 hours. Hemoglobin A1C No results for input(s): HGBA1C in the last 72 hours.  Fasting Lipid Panel No results for input(s): CHOL, HDL, LDLCALC, TRIG, CHOLHDL, LDLDIRECT in the last 72 hours.   Thyroid Function Tests No results for input(s): TSH, T4TOTAL, T3FREE, THYROIDAB in the last 72 hours.  Invalid input(s): FREET3  Other results:   Imaging    No results found.  Medications:     Scheduled Medications:  ALPRAZolam  1 mg Oral TID   amiodarone  200 mg Oral BID   Followed by   Derrill Memo ON 07/23/2021] amiodarone  200 mg Oral Daily   aspirin  81 mg Oral Daily   Or   aspirin EC  81 mg Oral Daily   atorvastatin  80 mg Oral Daily   B-complex with vitamin C  1 tablet Oral Daily   calcium acetate  667 mg Oral TID WC   carvedilol  3.125 mg Oral BID WC   chlorhexidine  15 mL Mouth Rinse BID   [START ON 07/23/2021] Chlorhexidine Gluconate Cloth  6 each Topical Daily   clopidogrel  75 mg Oral Daily   [facility], Patient & Family Education   Does not apply Once   [START ON 07/28/2021] darbepoetin  (ARANESP) injection - NON-DIALYSIS  40 mcg Subcutaneous Q Mon-1800   feeding supplement (PROSource TF)  45 mL Per Tube QID   hydrALAZINE  12.5 mg Oral TID   insulin aspart  3-9 Units Subcutaneous TID WC & HS   insulin detemir  20 Units Subcutaneous BID   isosorbide dinitrate  10 mg Oral TID   mouth rinse  15 mL Mouth Rinse q12n4p   multivitamin with minerals  1 tablet Oral Daily   pantoprazole  40 mg Oral Daily   sertraline  50 mg Oral Daily   sodium chloride flush  3 mL Intravenous Q12H   sodium chloride flush  3 mL Intravenous Q12H   traZODone  100 mg Oral QHS    Infusions:  sodium chloride Stopped (07/13/21 0234)   sodium chloride     sodium chloride     sodium chloride     albumin human 12.5 g (07/08/21 1827)   And   sodium chloride Stopped (07/08/21 2051)    ceFAZolin (ANCEF) IV     ferric gluconate (FERRLECIT) IVPB Stopped (07/21/21 1323)   heparin 600 Units/hr (07/22/21 0523)   lactated ringers Stopped (07/16/21 0848)   promethazine (PHENERGAN) injection (IM or IVPB) Stopped (07/09/21 1628)    PRN Medications: sodium chloride, sodium chloride, sodium chloride, acetaminophen, albumin human **AND** sodium chloride, albuterol, alteplase, docusate, heparin, lidocaine (PF), lidocaine-prilocaine, metoprolol tartrate, ondansetron (ZOFRAN) IV, oxyCODONE, pentafluoroprop-tetrafluoroeth, polyethylene glycol, promethazine (PHENERGAN) injection (IM or IVPB), sennosides, simethicone, sodium chloride flush, sodium chloride flush    Patient Profile   Billy [name] is a 51 year with a history of visually impaired, DM, HTN, depression, anxiety takes 2 mg xanax 3 times a day, SVT, CKD Stage II, PAF, and CAD.Marland Kitchen   Anterior Stemi--> CABG--> cardiogenic shock.   Assessment/Plan   1. CAD: Admitted with anterolateral MI from occluded diagonal treated with DES.  Patient then had CABG with LIMA-LAD, SVG-D, SVG-PDA, and left radial-OM for complete revascularization on 10/25   - Continue ASA 81.  Will likely drop this at discharge given need for Plavix and anticoagulation.  - Continue Plavix 75 daily.  - atorvastatin 80 mg daily.  - Amlodipine for radial harvest held with hypotension on pressors.    - No chest pain 2. Atrial fibrillation: Paroxysmal, s/p Maze and appendage clipping.  Having brief runs of atrial fibrillation but not sustained. Nothing recent.  -In NSR . Cut back to amio 200 mg daily.  - Tolerating low-dose heparin @ 600 u/hr. No bleeding. HL < 0.1.  Consider DOAC once tunneled HD cath placed. - Hgb 8.7 -> 8.2 -> 8.3 ->7.7 ->7.5  - No  obvious source.  - In patients with surgical LA appendage closure, if unable to take anticoagulation long-term should get DOAC 3 months then assess for completeness of closure with TEE.  If able to take anticoagulation, the LAA closure is adjunctive.  3. AKI on CKD stage 3: Baseline creatinine around 1.6, up to 5 post-op.  He has likely had multiple hits including contrast with initial cath/PCI and possible peri-operative hypotension.  Suspect ATN. Concerned about chances of renal recovery. Stopped  CVVHD 11/4 11/7 made 1400 cc urine.  - Had iHD 11/6. Tunneled cath today 4. Acute systolic CHF: Ischemic cardiomyopathy.  Echo prior to this admission with EF 50-55% (pre-MI and CABG).  Limited echo on 10/28 showed minimal pericardial effusion, EF in 25% range. Intraoperative TEE with EF 20%. Off vasopressin + NE + Off DBA . Co-ox stable 69%.  - Continue hydralazine 12.5 mg tid + isordil 10 tid. Will not push today as need BP for iHD - Continue  coreg 3.125 mg BID - No ARN/SGLT2 with AKI - Tolerated iHD - Given IV lasix per nephrology.   5. Anemia: Transfuse hgb < 7.5 , had 1 unit 11/02 - Getting IV iron with HD and aranesp.  Hgb 7.5 today. No obvious source of bleeding.  6. ID: Afebrile, off abx. WBC stable 9.1  - Course of cefepime completed 7. Acute hypoxemic respiratory failure: Suspect primarily pulmonary edema, now intubated.  Possible  PNA with bibasilar lung opacities. Extubated, completed course of cefepime, volume status now looks good. 02 stable on room air.  Continue to wean.  8. Thrombocytopenia: Resolved.  9. Right sided weakness: Normal head CT 11/03 10. Urinary Retention  Foley placed 11/7  Consider flomax 0.4 mg at bed time?   Tunneled catheter today.   Amy Clegg NP-C 07/22/2021 8:50 AM  Agree with NP  note.   Increasing UOP, 1400 cc yesterday.  Had HD this morning. He remains in NSR .  General: NAD Neck: No JVD, no thyromegaly or thyroid nodule.  Lungs: Clear to auscultation bilaterally with normal respiratory effort. CV: Nondisplaced PMI.  Heart regular S1/S2, no S3/S4, no murmur.  No peripheral edema.   Abdomen: Soft, nontender, no hepatosplenomegaly, no distention.  Skin: Intact without lesions or rashes.  Neurologic: Alert and oriented x 3.  Psych: Normal affect. Extremities: No clubbing or cyanosis.  HEENT: Normal.   To get tunneled HD catheter today.  However, increasing UOP is encouraging for possible renal recovery eventually.  Will continue current doses hydralazine/isordil, avoid aggressive drop in MAP.    Remains in NSR on amiodarone.  Will need to decide long-term anticoagulation in setting of LA appendage clip with surgery.  My best understanding of the available data is in the NP's note above under #2.   Loralie Champagne 07/22/2021

## 2021-07-22 NOTE — Progress Notes (Signed)
OT Cancellation Note  Patient Details Name: Billy [name] MRN: [id number] DOB: March 28, 1966   Cancelled Treatment:    Reason Eval/Treat Not Completed: Patient at procedure or test/ unavailable Joeseph Amor OTR/L  Acute Rehab Services  [phone] office number [phone] pager number  Joeseph Amor 07/22/2021, 11:22 AM

## 2021-07-22 NOTE — Progress Notes (Signed)
[facility] for heparin Indication: atrial fibrillation  No Known Allergies  Patient Measurements: Height: 6\' 2"  (188 cm) Weight: 108.9 kg (240 lb 1.3 oz) IBW/kg (Calculated) : 82.2 HEPARIN DW (KG): 106.6   Vital Signs: Temp: 97.1 F (36.2 C) (11/08 0723) Temp Source: Oral (11/08 0723) BP: 116/78 (11/08 0900) Pulse Rate: 78 (11/08 0830)  Labs: Recent Labs    07/20/21 0209 07/21/21 0055 07/21/21 0838 07/22/21 0054  HGB 8.3* 7.7*  --  7.5*  HCT 26.2* 23.9*  --  22.9*  PLT 398 381  --  464*  HEPARINUNFRC <0.10* <0.10* <0.10* <0.10*  CREATININE 6.36* 5.26*  --  6.50*     Estimated Creatinine Clearance: 16.7 mL/min (A) (by C-G formula based on SCr of 6.5 mg/dL (H)).   Medical History: Past Medical History:  Diagnosis Date   Anxiety    Depression    Diabetic neuropathy ([place])    Diabetic retinopathy ([place])    visual impairment   Dysrhythmia    "palpatations sometimes" (06/30/2017)   Exertional dyspnea 06/30/2021   Family history of adverse reaction to anesthesia    sister had OR 2016; "couldn't wake up; could hear what they were saying but couldn't get their attention; like I was paralyzed but fully awake" (06/30/2017)   Pneumonia X 2   SVT (supraventricular tachycardia) ([place])    Toe ulcer ([place]) 06/30/2017   2nd digit   Type II diabetes mellitus ([place])     Medications:  Infusions:   sodium chloride Stopped (07/13/21 0234)   sodium chloride     sodium chloride     sodium chloride     albumin human 12.5 g (07/08/21 1827)   And   sodium chloride Stopped (07/08/21 2051)    ceFAZolin (ANCEF) IV     ferric gluconate (FERRLECIT) IVPB Stopped (07/21/21 1323)   heparin 600 Units/hr (07/22/21 0523)   lactated ringers Stopped (07/16/21 0848)   promethazine (PHENERGAN) injection (IM or IVPB) Stopped (07/09/21 1628)    Assessment: 55 yo M s/p CABG, MAZE, and atriaclip on 10/25. Has experienced intermittent post-op a fib treated  with amiodarone and was previously on a therapeutic heparin gtt with goal of 0.3-0.5. Also noted to have post-op bleeding and received RBCs on 11/2.  HGB low stable receiving darbpoetin and IV FE with HD - per renal  Heparin level undetectable on fixed rate heparin 600 uts/hr.  Discussed with surgery and cardiology. Will leave at fixed rate until after tunneled HD cath is completed then plan to transition to doac once all procedures are complete.   Goal of Therapy:  Fixed dose heparin per MD Monitor platelets by anticoagulation protocol: Yes   Plan:  -No titration of heparin; continue 600 units/hr -Heparin level and CBC in am  Bonnita Nasuti Pharm.D. CPP, BCPS Clinical Pharmacist [phone] 56/04/2021 9:32 AM

## 2021-07-22 NOTE — Care Management Important Message (Signed)
Important Message  Patient Details  Name: Billy [name] MRN: [id number] Date of Birth: 1966/03/11   Medicare Important Message Given:        Orbie Pyo 07/22/2021, 3:13 PM

## 2021-07-22 NOTE — Progress Notes (Signed)
Inpatient Rehab Admissions Coordinator:   Per LCSW request, pt was rescreened for CIR by Shann Medal, PT, DPT.  Pt making excellent progress with therapy.  I still believe that it will be very difficult, if not impossible to get approval for CIR from [facility].  At this time, note that insurance Josem Kaufmann is pending with [employer] and we could not pursue auth for CIR at the same time.  I can place a consult for CIR and we can assess for candidacy, but SNF auth will need to be withdrawn if we are to pursue CIR.    Shann Medal, PT, DPT Admissions Coordinator [phone] 07/22/21  11:22 AM

## 2021-07-23 ENCOUNTER — Other Ambulatory Visit (HOSPITAL_COMMUNITY): Payer: Self-pay

## 2021-07-23 DIAGNOSIS — N179 Acute kidney failure, unspecified: Secondary | ICD-10-CM | POA: Diagnosis not present

## 2021-07-23 LAB — GLUCOSE, CAPILLARY
Glucose-Capillary: 132 mg/dL — ABNORMAL HIGH (ref 70–99)
Glucose-Capillary: 142 mg/dL — ABNORMAL HIGH (ref 70–99)
Glucose-Capillary: 146 mg/dL — ABNORMAL HIGH (ref 70–99)
Glucose-Capillary: 160 mg/dL — ABNORMAL HIGH (ref 70–99)
Glucose-Capillary: 186 mg/dL — ABNORMAL HIGH (ref 70–99)
Glucose-Capillary: 218 mg/dL — ABNORMAL HIGH (ref 70–99)
Glucose-Capillary: 244 mg/dL — ABNORMAL HIGH (ref 70–99)
Glucose-Capillary: 98 mg/dL (ref 70–99)

## 2021-07-23 LAB — COOXEMETRY PANEL
Carboxyhemoglobin: 1.9 % — ABNORMAL HIGH (ref 0.5–1.5)
Carboxyhemoglobin: 1.6 % — ABNORMAL HIGH (ref 0.5–1.5)
Methemoglobin: 1.1 % (ref 0.0–1.5)
Methemoglobin: 0.9 % (ref 0.0–1.5)
O2 Saturation: 52.1 %
O2 Saturation: 38.3 %
Total hemoglobin: 7.5 g/dL — ABNORMAL LOW (ref 12.0–16.0)
Total hemoglobin: 9 g/dL — ABNORMAL LOW (ref 12.0–16.0)

## 2021-07-23 LAB — CBC
HCT: 22.5 % — ABNORMAL LOW (ref 39.0–52.0)
Hemoglobin: 7.3 g/dL — ABNORMAL LOW (ref 13.0–17.0)
MCH: 30.7 pg (ref 26.0–34.0)
MCHC: 32.4 g/dL (ref 30.0–36.0)
MCV: 94.5 fL (ref 80.0–100.0)
Platelets: 428 10*3/uL — ABNORMAL HIGH (ref 150–400)
RBC: 2.38 MIL/uL — ABNORMAL LOW (ref 4.22–5.81)
RDW: 13.5 % (ref 11.5–15.5)
WBC: 7.5 10*3/uL (ref 4.0–10.5)
nRBC: 0 % (ref 0.0–0.2)

## 2021-07-23 LAB — MAGNESIUM: Magnesium: 2.3 mg/dL (ref 1.7–2.4)

## 2021-07-23 LAB — RENAL FUNCTION PANEL
Albumin: 2.2 g/dL — ABNORMAL LOW (ref 3.5–5.0)
Anion gap: 10 (ref 5–15)
BUN: 54 mg/dL — ABNORMAL HIGH (ref 6–20)
CO2: 26 mmol/L (ref 22–32)
Calcium: 8 mg/dL — ABNORMAL LOW (ref 8.9–10.3)
Chloride: 101 mmol/L (ref 98–111)
Creatinine, Ser: 4.53 mg/dL — ABNORMAL HIGH (ref 0.61–1.24)
GFR, Estimated: 14 mL/min — ABNORMAL LOW (ref >60)
Glucose, Bld: 131 mg/dL — ABNORMAL HIGH (ref 70–99)
Phosphorus: 5 mg/dL — ABNORMAL HIGH (ref 2.5–4.6)
Potassium: 3.7 mmol/L (ref 3.5–5.1)
Sodium: 137 mmol/L (ref 135–145)

## 2021-07-23 LAB — HEMOGLOBIN AND HEMATOCRIT, BLOOD
HCT: 27 % — ABNORMAL LOW (ref 39.0–52.0)
Hemoglobin: 9 g/dL — ABNORMAL LOW (ref 13.0–17.0)

## 2021-07-23 LAB — PREPARE RBC (CROSSMATCH)

## 2021-07-23 LAB — HEPARIN LEVEL (UNFRACTIONATED): Heparin Unfractionated: <0.1 IU/mL — ABNORMAL LOW (ref 0.30–0.70)

## 2021-07-23 MED ORDER — DARBEPOETIN ALFA 100 MCG/0.5ML IJ SOSY
100.0000 ug | PREFILLED_SYRINGE | INTRAMUSCULAR | Status: DC
  Administered 2021-07-28: 100 ug via SUBCUTANEOUS
  Filled 2021-07-23 (×3): qty 0.5

## 2021-07-23 MED ORDER — CHLORHEXIDINE GLUCONATE CLOTH 2 % EX PADS
6.0000 | MEDICATED_PAD | Freq: Every day | CUTANEOUS | Status: DC
  Administered 2021-07-24 – 2021-07-25 (×2): 6 via TOPICAL

## 2021-07-23 MED ORDER — RENA-VITE PO TABS
1.0000 | ORAL_TABLET | Freq: Every day | ORAL | Status: DC
  Administered 2021-07-23 – 2021-08-05 (×14): 1 via ORAL
  Filled 2021-07-23 (×15): qty 1

## 2021-07-23 MED ORDER — APIXABAN 5 MG PO TABS
5.0000 mg | ORAL_TABLET | Freq: Two times a day (BID) | ORAL | Status: DC
  Administered 2021-07-23 – 2021-08-06 (×28): 5 mg via ORAL
  Filled 2021-07-23 (×20): qty 1
  Filled 2021-07-23: qty 2
  Filled 2021-07-23 (×7): qty 1

## 2021-07-23 MED ORDER — NEPRO/CARBSTEADY PO LIQD
237.0000 mL | Freq: Two times a day (BID) | ORAL | Status: DC
  Administered 2021-07-23 – 2021-08-06 (×27): 237 mL via ORAL

## 2021-07-23 MED ORDER — SODIUM CHLORIDE 0.9% IV SOLUTION: Freq: Once | INTRAVENOUS | Status: AC

## 2021-07-23 NOTE — Progress Notes (Addendum)
Patient ID: Billy [name], male   DOB: 07-02-1966, 55 y.o.   MRN: [id number]     Advanced Heart Failure Rounding Note  PCP-Cardiologist: None   Subjective:    10/25: S/P CABG x4 with MAZE . Intra Op EF 20%.  10/27: Intubated. Hypotensive. Placed on Norepi + Vaso. Recurrent A fib with amio increased to 60 mg/hr.  10/29: Co-ox low, DBA added 11/1: Extubated. AF with RVR, amio gtt restarted 11/03: Dobutamine off 11/6: Started iHD  11/8: R IJ tunneled HD cath placed  Co-ox 52%  Has foley for urinary retention. 1200 cc urine documented yesterday  Remains on heparin 600 uts/hr + DAPT with aspirin and plavix. Hgb trending down 8.3>7.7 >7.5 > 7.3  Feels okay. + Dizziness with position changes. Some weakness, wants to get up and walk.    Objective:   Weight Range: 105.7 kg Body mass index is 29.92 kg/m.   Vital Signs:   Temp:  [97.1 F (36.2 C)-98.4 F (36.9 C)] 98.2 F (36.8 C) (11/09 0340) Pulse Rate:  [77-90] 78 (11/09 0340) Resp:  [16-24] 20 (11/09 0340) BP: (97-146)/(51-82) 113/66 (11/09 0340) SpO2:  [92 %-97 %] 93 % (11/09 0340) Weight:  [105.7 kg-108.9 kg] 105.7 kg (11/09 0340) Last BM Date: 07/22/21  Weight change: Filed Weights   07/22/21 0400 07/22/21 0723 07/23/21 0340  Weight: 108 kg 108.9 kg 105.7 kg    Intake/Output:   Intake/Output Summary (Last 24 hours) at 07/23/2021 0703 Last data filed at 07/23/2021 0342 Gross per 24 hour  Intake 1025.29 ml  Output 3200 ml  Net -2174.71 ml      Physical Exam   General:  Lying comfortably in bed.  HEENT: normal Neck: supple. no JVD. R IJ HD catheter Cor: PMI nondisplaced. Regular rate & rhythm. No rubs, gallops or murmurs. Sternal incision healing well. Lungs: diminished Abdomen: soft, nontender, nondistended. No hepatosplenomegaly.  Extremities: no cyanosis, clubbing, rash, edema Neuro: alert & orientedx3, cranial nerves grossly intact. moves all 4 extremities w/o difficulty. Affect pleasant GU: +  foley   Telemetry  NSR 70s - 80s personally reviewed  Labs    CBC Recent Labs    07/22/21 0054 07/23/21 0340  WBC 9.1 7.5  HGB 7.5* 7.3*  HCT 22.9* 22.5*  MCV 94.6 94.5  PLT 464* 086*   Basic Metabolic Panel Recent Labs    07/22/21 0054 07/23/21 0340  NA 132* 137  K 4.4 3.7  CL 98 101  CO2 22 26  GLUCOSE 140* 131*  BUN 80* 54*  CREATININE 6.50* 4.53*  CALCIUM 8.0* 8.0*  MG 2.6* 2.3  PHOS 7.7* 5.0*   Liver Function Tests Recent Labs    07/22/21 0054 07/23/21 0340  ALBUMIN 2.3* 2.2*   No results for input(s): LIPASE, AMYLASE in the last 72 hours. Cardiac Enzymes No results for input(s): CKTOTAL, CKMB, CKMBINDEX, TROPONINI in the last 72 hours.  BNP: BNP (last 3 results) No results for input(s): BNP in the last 8760 hours.  ProBNP (last 3 results) No results for input(s): PROBNP in the last 8760 hours.   D-Dimer No results for input(s): DDIMER in the last 72 hours. Hemoglobin A1C No results for input(s): HGBA1C in the last 72 hours.  Fasting Lipid Panel No results for input(s): CHOL, HDL, LDLCALC, TRIG, CHOLHDL, LDLDIRECT in the last 72 hours.   Thyroid Function Tests No results for input(s): TSH, T4TOTAL, T3FREE, THYROIDAB in the last 72 hours.  Invalid input(s): FREET3  Other results:   Imaging  IR US Guide Vasc Access Right  Result Date: 07/22/2021 INDICATION: 24 year old with acute kidney injury. Request for a tunneled dialysis catheter. Patient currently has a non tunneled left jugular catheter. EXAM: FLUOROSCOPIC AND ULTRASOUND GUIDED PLACEMENT OF A TUNNELED DIALYSIS CATHETER Physician: Stephan Minister. Billy Pancoast, MD MEDICATIONS: Ancef 2 g; The antibiotic was administered within an appropriate time interval prior to skin puncture. ANESTHESIA/SEDATION: Moderate (conscious) sedation was employed during this procedure. A total of Versed 2.0mg  and fentanyl 100 mcg was administered intravenously at the order of the provider performing the procedure. Total  intra-service moderate sedation time: 25 minutes. Patient's level of consciousness and vital signs were monitored continuously by radiology nurse throughout the procedure under the supervision of the provider performing the procedure. FLUOROSCOPY TIME:  Fluoroscopy Time: 1 minute, 30 seconds, 13 mGy COMPLICATIONS: None immediate. PROCEDURE: Informed consent was obtained for placement of a tunneled dialysis catheter. The patient was placed supine on the interventional table. Ultrasound confirmed a patent right internal jugular vein. Ultrasound image obtained for documentation. The right neck and chest was prepped and draped in a sterile fashion. Maximal barrier sterile technique was utilized including caps, mask, sterile gowns, sterile gloves, sterile drape, hand hygiene and skin antiseptic. The right neck was anesthetized with 1% lidocaine. A small incision was made with #11 blade scalpel. A 21 gauge needle directed into the right internal jugular vein with ultrasound guidance. A micropuncture dilator set was placed. A 23 cm tip to cuff Palindrome catheter was selected. The skin below the right clavicle was anesthetized and a small incision was made with an #11 blade scalpel. A subcutaneous tunnel was formed to the vein dermatotomy site. The catheter was brought through the tunnel. The vein dermatotomy site was dilated to accommodate a peel-away sheath. The catheter was placed through the peel-away sheath and directed into the central venous structures. The tip of the catheter was placed at superior cavoatrial junction with fluoroscopy. Fluoroscopic images were obtained for documentation. Both lumens were found to aspirate and flush well. The proper amount of heparin was flushed in both lumens. The vein dermatotomy site was closed using a single layer of absorbable suture and Dermabond. The catheter was secured to the skin using Prolene suture. IMPRESSION: Successful placement of a right jugular tunneled dialysis  catheter using ultrasound and fluoroscopic guidance. Electronically Signed   By: Billy [name] M.D.   On: 07/22/2021 14:14     Medications:     Scheduled Medications:  ALPRAZolam  1 mg Oral TID   amiodarone  200 mg Oral Daily   aspirin  81 mg Oral Daily   Or   aspirin EC  81 mg Oral Daily   atorvastatin  80 mg Oral Daily   B-complex with vitamin C  1 tablet Oral Daily   calcium acetate  667 mg Oral TID WC   carvedilol  3.125 mg Oral BID WC   chlorhexidine  15 mL Mouth Rinse BID   Chlorhexidine Gluconate Cloth  6 each Topical Daily   clopidogrel  75 mg Oral Daily   [facility], Patient [facility] Family Education   Does not apply Once   [START ON 07/28/2021] darbepoetin (ARANESP) injection - NON-DIALYSIS  100 mcg Subcutaneous Q Mon-1800   feeding supplement (PROSource TF)  45 mL Per Tube QID   hydrALAZINE  12.5 mg Oral TID   insulin aspart  3-9 Units Subcutaneous TID WC & HS   insulin detemir  20 Units Subcutaneous BID   isosorbide dinitrate  10  mg Oral TID   mouth rinse  15 mL Mouth Rinse q12n4p   multivitamin with minerals  1 tablet Oral Daily   pantoprazole  40 mg Oral Daily   sertraline  50 mg Oral Daily   sodium chloride flush  3 mL Intravenous Q12H   traZODone  100 mg Oral QHS    Infusions:  heparin 600 Units/hr (07/23/21 0300)   promethazine (PHENERGAN) injection (IM or IVPB) Stopped (07/09/21 1628)    PRN Medications: acetaminophen, albuterol, docusate, lip balm, metoprolol tartrate, ondansetron (ZOFRAN) IV, oxyCODONE, polyethylene glycol, promethazine (PHENERGAN) injection (IM or IVPB), sennosides, simethicone    Patient Profile   Billy [name] is a 75 year with a history of visually impaired, DM, HTN, depression, anxiety takes 2 mg xanax 3 times a day, SVT, CKD Stage II, PAF, and CAD.Marland Kitchen   Anterior Stemi--> CABG--> cardiogenic shock.   Assessment/Plan   1. CAD: Admitted with anterolateral MI from occluded diagonal treated with DES.  Patient then had CABG  with LIMA-LAD, SVG-D, SVG-PDA, and left radial-OM for complete revascularization on 10/25   - Continue ASA 81. Will likely drop this at discharge given need for Plavix and anticoagulation.  - Continue Plavix 75 daily.  - atorvastatin 80 mg daily.  - Amlodipine for radial harvest held with hypotension on pressors.    - No chest pain 2. Atrial fibrillation: Paroxysmal, s/p Maze and appendage clipping.  Having brief runs of atrial fibrillation but not sustained. Nothing recent.  -In NSR . Cut back to amio 200 mg daily.  - On low-dose heparin @ 600 u/hr. HL not detectable.  Hgb 8.7 -> 8.2 -> 8.3 ->7.7 ->7.5. No obvious source. Transfusing today. See below. - Consider DOAC prior to d/c - In patients with surgical LA appendage closure, if unable to take anticoagulation long-term should get DOAC 3 months then assess for completeness of closure with TEE.  If able to take anticoagulation, the LAA closure is adjunctive.  3. AKI on CKD stage 3: Baseline creatinine around 1.6, up to 5 post-op.  He has likely had multiple hits including contrast with initial cath/PCI and possible peri-operative hypotension.  Suspect ATN. Concerned about chances of renal recovery. Stopped  CVVHD 11/4 -11/6 started iHD, 11/08 R IJ tunneled HD catheter placed. Next dialysis session planned 11/10. - Making some urine, 1200 cc yesterday. UOP encouraging for possible eventual renal recovery 4. Acute systolic CHF: Ischemic cardiomyopathy.  Echo prior to this admission with EF 50-55% (pre-MI and CABG).  Limited echo on 10/28 showed minimal pericardial effusion, EF in 25% range. Intraoperative TEE with EF 20%. Off vasopressin + NE + Off DBA. Co-ox low at 52%. ? Anemia contributing. Will recheck post transfusion.  - Continue hydralazine 12.5 mg tid + isordil 10 tid. Will not titrate further SBP 100-110 today. - Continue  coreg 3.125 mg BID - No ARN/SGLT2 with AKI - Tolerated iHD  5. Anemia: Hgb 7.3. Had 1 unit pRBCs 11/02 - Getting IV  iron with HD and aranesp.  - Transfuse with 1 unit pRBCs today - No obvious source 6. ID: Afebrile, off abx. WBC stable 7.5 - Course of cefepime completed 7. Acute hypoxemic respiratory failure: Suspect primarily pulmonary edema, intubated. Possible PNA with bibasilar lung opacities. Extubated, completed course of cefepime, volume status now looks good. O2 stable on room air.   8. Thrombocytopenia: Resolved.  9. Right sided weakness: Normal head CT 11/03 10. Urinary Retention  -Foley placed 11/7  -Consider flomax 0.4 mg at bed time?   [facility], Billy N PA-C 07/23/2021 7:03 AM  Patient seen with PA, agree with the above note.   He had about 1200 cc UOP as well as HD yesterday, now has permanent dialysis catheter.    Some lightheadedness with walking, SBP 100s-110s.   Hgb lower at 7.3 without overt bleeding.   He remains in NSR on amiodarone po.   General: NAD Neck: No JVD, no thyromegaly or thyroid nodule.  Lungs: Clear to auscultation bilaterally with normal respiratory effort. CV: Nondisplaced PMI.  Heart regular S1/S2, no S3/S4, no murmur.  No peripheral edema.   Abdomen: Soft, nontender, no hepatosplenomegaly, no distention.  Skin: Intact without lesions or rashes.  Neurologic: Alert and oriented x 3.  Psych: Normal affect. Extremities: No clubbing or cyanosis.  HEENT: Normal.   He does not appear volume overloaded.  Continue to follow UOP, does not appear to need HD today.  Reassessing daily.   Will give 1 unit PRBCs with gradually down-trending hgb.   Stop ASA and heparin.  Will continue Plavix for 6 months.  Start Eliquis this evening with PAF.   Continue current Coreg and hydralazine/Isordil.   No uptitration with orthostatic symptoms.  Needs to walk today.   Loralie Champagne 07/23/2021 8:26 AM

## 2021-07-23 NOTE — TOC Progression Note (Signed)
Transition of Care [facility]) - Progression Note    Patient Details  Name: AZEL GUMINA MRN: [id number] Date of Birth: 01-Sep-1966  Transition of Care [facility]) CM/SW [place], [place] Phone Number: 07/23/2021, 3:18 PM  Clinical Narrative:    HF CSW received a message that CIR is signing off as Mr. Krzyzanowski walked 452ft with PT. CSW reached out to the patient's sister, [name] to update her about CIR not a possibility and to discuss options. [name] reported to go with [facility].  CSW reached out to [place] to start insurance authorization and faxed over clinicals to them to start insurance.  CSW will continue to follow throughout discharge.   Expected Discharge Plan: [place] Barriers to Discharge: Continued Medical Work up  Expected Discharge Plan and Services Expected Discharge Plan: [place] In-house Referral: Clinical Social Work Discharge Planning Services: CM Consult Post Acute Care Choice: [place] Living arrangements for the past 2 months: Single Family Home                                       Social Determinants of Health (SDOH) Interventions Food Insecurity Interventions: Intervention Not Indicated Financial Strain Interventions: Intervention Not Indicated Housing Interventions: Intervention Not Indicated Transportation Interventions: Intervention Not Indicated, Other (Comment) (Pt is visually impaired but receives help from family with transportation needs.)  Readmission Risk Interventions No flowsheet data found.  Jachin Coury, MSW, [place] [facility] Failure Social Worker

## 2021-07-23 NOTE — Discharge Instructions (Addendum)
Information on my medicine - ELIQUIS (apixaban)  Why was Eliquis prescribed for you? Eliquis was prescribed for you to reduce the risk of forming blood clots that can cause a stroke if you have a medical condition called atrial fibrillation (a type of irregular heartbeat) OR to reduce the risk of a blood clots forming after orthopedic surgery.  What do You need to know about Eliquis ? Take your Eliquis TWICE DAILY - one tablet in the morning and one tablet in the evening with or without food.  It would be best to take the doses about the same time each day.  If you have difficulty swallowing the tablet whole please discuss with your pharmacist how to take the medication safely.  Take Eliquis exactly as prescribed by your doctor and DO NOT stop taking Eliquis without talking to the doctor who prescribed the medication.  Stopping may increase your risk of developing a new clot or stroke.  Refill your prescription before you run out.  After discharge, you should have regular check-up appointments with your healthcare provider that is prescribing your Eliquis.  In the future your dose may need to be changed if your kidney function or weight changes by a significant amount or as you get older.  What do you do if you miss a dose? If you miss a dose, take it as soon as you remember on the same day and resume taking twice daily.  Do not take more than one dose of ELIQUIS at the same time.  Important Safety Information A possible side effect of Eliquis is bleeding. You should call your healthcare provider right away if you experience any of the following: Bleeding from an injury or your nose that does not stop. Unusual colored urine (red or dark brown) or unusual colored stools (red or black). Unusual bruising for unknown reasons. A serious fall or if you hit your head (even if there is no bleeding).  Some medicines may interact with Eliquis and might increase your risk of bleeding or  clotting while on Eliquis. To help avoid this, consult your healthcare provider or pharmacist prior to using any new prescription or non-prescription medications, including herbals, vitamins, non-steroidal anti-inflammatory drugs (NSAIDs) and supplements.  This website has more information on Eliquis (apixaban): [email or web address].      Plate Method for Diabetes   Foods with carbohydrates make your blood glucose level go up. The plate method is a simple way to meal plan and control the amount of carbohydrate you eat.         Use the following guidance to build a healthy plate to control carbohydrates. Divide a 9-inch plate into 3 sections, and consider your beverage the 4th section of your meal: Food Group Examples of Foods/Beverages for This Section of your Meal  Section 1: Non-starchy vegetables Fill  of your plate to include non-starchy vegetables Asparagus, broccoli, brussels sprouts, cabbage, carrots, cauliflower, celery, cucumber, green beans, mushrooms, peppers, salad greens, tomatoes, or zucchini.  Section 2: Protein foods Fill  of your plate to include a lean protein Lean meat, poultry, fish, seafood, cheese, eggs, lean deli meat, tofu, beans, lentils, nuts or nut butters.  Section 3: Carbohydrate foods Fill  of your plate to include carbohydrate foods Whole grains, whole wheat bread, brown rice, whole grain pasta, polenta, corn tortillas, fruit, or starchy vegetables (potatoes, green peas, corn, beans, acorn squash, and butternut squash). One cup of milk also counts as a food that contains carbohydrate.  Section 4: Beverage Choose water  or a low-calorie drink for your beverage. Unsweetened tea, coffee, or flavored/sparkling water without added sugar.  Image reprinted with permission from The American Diabetes Association.  Copyright 2022 by the American Diabetes Association.   Copyright 2022  Academy of Nutrition and Dietetics. All rights reserved     Heart Healthy,  Consistent Carbohydrate Nutrition Therapy   A heart-healthy and consistent carbohydrate diet is recommended to manage heart disease and diabetes. To follow a heart-healthy and consistent carbohydrate diet, Eat a balanced diet with whole grains, fruits and vegetables, and lean protein sources.  Choose heart-healthy unsaturated fats. Limit saturated fats, trans fats, and cholesterol intake. Eat more plant-based or vegetarian meals using beans and soy foods for protein.  Eat whole, unprocessed foods to limit the amount of sodium (salt) you eat.  Choose a consistent amount of carbohydrate at each meal and snack. Limit refined carbohydrates especially sugar, sweets and sugar-sweetened beverages.  If you drink alcohol, do so in moderation: one serving per day (women) and two servings per day (men). o One serving is equivalent to 12 ounces beer, 5 ounces wine, or 1.5 ounces distilled spirits  Tips Tips for Choosing Heart-Healthy Fats Choose lean protein and low-fat dairy foods to reduce saturated fat intake. Saturated fat is usually found in animal-based protein and is associated with certain health risks. Saturated fat is the biggest contributor to raise low-density lipoprotein (LDL) cholesterol levels. Research shows that limiting saturated fat lowers unhealthy cholesterol levels. Eat no more than 7% of your total calories each day from saturated fat. Ask your RDN to help you determine how much saturated fat is right for you. There are many foods that do not contain large amounts of saturated fats. Swapping these foods to replace foods high in saturated fats will help you limit the saturated fat you eat and improve your cholesterol levels. You can also try eating more plant-based or vegetarian meals. Instead of. Try:  Whole milk, cheese, yogurt, and ice cream 1% or skim milk, low-fat cheese, non-fat yogurt, and low-fat ice cream  Fatty, marbled beef and pork Lean beef, pork, or venison  Poultry with  skin Poultry without skin  Butter, stick margarine Reduced-fat, whipped, or liquid spreads  Coconut oil, palm oil Liquid vegetable oils: corn, canola, olive, soybean and safflower oils   Avoid foods that contain trans fats. Trans fats increase levels of LDL-cholesterol. Hydrogenated fat in processed foods is the main source of trans fats in foods.  Trans fats can be found in stick margarine, shortening, processed sweets, baked goods, some fried foods, and packaged foods made with hydrogenated oils. Avoid foods with "partially hydrogenated oil" on the ingredient list such as: cookies, pastries, baked goods, biscuits, crackers, microwave popcorn, and frozen dinners. Choose foods with heart healthy fats. Polyunsaturated and monounsaturated fat are unsaturated fats that may help lower your blood cholesterol level when used in place of saturated fat in your diet. Ask your RDN about taking a dietary supplement with plant sterols and stanols to help lower your cholesterol level. Research shows that substituting saturated fats with unsaturated fats is beneficial to cholesterol levels. Try these easy swaps: Instead of. Try:  Butter, stick margarine, or solid shortening Reduced-fat, whipped, or liquid spreads  Beef, pork, or poultry with skin Fish and seafood  Chips, crackers, snack foods Raw or unsalted nuts and seeds or nut butters Hummus with vegetables Avocado on toast  Coconut oil, palm oil Liquid vegetable oils: corn, canola, olive, soybean and safflower oils  Limit the amount  of cholesterol you eat to less than 200 milligrams per day. Cholesterol is a substance carried through the bloodstream via lipoproteins, which are known as "transporters" of fat. Some body functions need cholesterol to work properly, but too much cholesterol in the bloodstream can damage arteries and build up blood vessel linings (which can lead to heart attack and stroke). You should eat less than 200 milligrams cholesterol per  day. People respond differently to eating cholesterol. There is no test available right now that can figure out which people will respond more to dietary cholesterol and which will respond less. For individuals with high intake of dietary cholesterol, different types of increase (none, small, moderate, large) in LDL-cholesterol levels are all possible.  Food sources of cholesterol include egg yolks and organ meats such as liver, gizzards. Limit egg yolks to two to four per week and avoid organ meats like liver and gizzards to control cholesterol intake.  Tips for Choosing Heart-Healthy Carbohydrates Consume a consistent amount of carbohydrate It is important to eat foods with carbohydrates in moderation because they impact your blood glucose level. Carbohydrates can be found in many foods such as: Grains (breads, crackers, rice, pasta, and cereals)  Starchy Vegetables (potatoes, corn, and peas)  Beans and legumes  Milk, soy milk, and yogurt  Fruit and fruit juice  Sweets (cakes, cookies, ice cream, jam and jelly) Your RDN will help you set a goal for how many carbohydrate servings to eat at your meals and snacks. For many adults, eating 3 to 5 servings of carbohydrate foods at each meal and 1 or 2 carbohydrate servings for each snack works well.  Check your blood glucose level regularly. It can tell you if you need to adjust when you eat carbohydrates.  Choose foods rich in viscous (soluble) fiber Viscous, or soluble, is found in the walls of plant cells. Viscous fiber is found only in plant-based foods. Eating foods with fiber helps to lower your unhealthy cholesterol and keep your blood glucose in range  Rich sources of viscous fiber include vegetables (asparagus, Brussels sprouts, sweet potatoes, turnips) fruit (apricots, mangoes, oranges), legumes, and whole grains (barley, oats, and oat bran).  As you increase your fiber intake gradually, also increase the amount of water you drink. This will  help prevent constipation.  If you have difficulty achieving this goal, ask your RDN about fiber laxatives. Choose fiber supplements made with viscous fibers such as psyllium seed husks or methylcellulose to help lower unhealthy cholesterol.  Limit refined carbohydrates  There are three types of carbohydrates: starches, sugar, and fiber. Some carbohydrates occur naturally in food, like the starches in rice or corn or the sugars in fruits and milk. Refined carbohydrates--foods with high amounts of simple sugars--can raise triglyceride levels. High triglyceride levels are associated with coronary heart disease. Some examples of refined carbohydrate foods are table sugar, sweets, and beverages sweetened with added sugar.  Tips for Reducing Sodium (Salt) Although sodium is important for your body to function, too much sodium can be harmful for people with high blood pressure. As sodium and fluid buildup in your tissues and bloodstream, your blood pressure increases. High blood pressure may cause damage to other organs and increase your risk for a stroke. Even if you take a pill for blood pressure or a water pill (diuretic) to remove fluid, it is still important to have less salt in your diet. Ask your doctor and RDN what amount of sodium is right for you. Avoid processed foods. Eat more  fresh foods.  Fresh fruits and vegetables are naturally low in sodium, as well as frozen vegetables and fruits that have no added juices or sauces.  Fresh meats are lower in sodium than processed meats, such as bacon, sausage, and hotdogs. Read the nutrition label or ask your butcher to help you find a fresh meat that is low in sodium. Eat less salt--at the table and when cooking.  A single teaspoon of table salt has 2,300 mg of sodium.  Leave the salt out of recipes for pasta, casseroles, and soups.  Ask your RDN how to cook your favorite recipes without sodium Be a smart shopper.  Look for food packages that say  "salt-free" or "sodium-free." These items contain less than 5 milligrams of sodium per serving.  "Very low-sodium" products contain less than 35 milligrams of sodium per serving.  "Low-sodium" products contain less than 140 milligrams of sodium per serving.  Beware for "Unsalted" or "No Added Salt" products. These items may still be high in sodium. Check the nutrition label. Add flavors to your food without adding sodium.  Try lemon juice, lime juice, fruit juice or vinegar.  Dry or fresh herbs add flavor. Try basil, bay leaf, dill, rosemary, parsley, sage, dry mustard, nutmeg, thyme, and paprika.  Pepper, red pepper flakes, and cayenne pepper can add spice t your meals without adding sodium. Hot sauce contains sodium, but if you use just a drop or two, it will not add up to much.  Buy a sodium-free seasoning blend or make your own at home. Additional Lifestyle Tips Achieve and maintain a healthy weight. Talk with your RDN or your doctor about what is a healthy weight for you. Set goals to reach and maintain that weight.  To lose weight, reduce your calorie intake along with increasing your physical activity. A weight loss of 10 to 15 pounds could reduce LDL-cholesterol by 5 milligrams per deciliter. Participate in physical activity. Talk with your health care team to find out what types of physical activity are best for you. Set a plan to get about 30 minutes of exercise on most days.  Foods Recommended Food Group Foods Recommended  Grains Whole grain breads and cereals, including whole wheat, barley, rye, buckwheat, corn, teff, quinoa, millet, amaranth, brown or wild rice, sorghum, and oats Pasta, especially whole wheat or other whole grain types  [employer], quinoa or wild rice Whole grain crackers, bread, rolls, pitas Home-made bread with reduced-sodium baking soda  Protein Foods Lean cuts of beef and pork (loin, leg, round, extra lean hamburger)  Skinless Cytogeneticist and  other wild game Dried beans and peas Nuts and nut butters Meat alternatives made with soy or textured vegetable protein  Egg whites or egg substitute Cold cuts made with lean meat or soy protein  Dairy Nonfat (skim), low-fat, or 1%-fat milk  Nonfat or low-fat yogurt or cottage cheese Fat-free and low-fat cheese  Vegetables Fresh, frozen, or canned vegetables without added fat or salt   Fruits Fresh, frozen, canned, or dried fruit   Oils Unsaturated oils (corn, olive, peanut, soy, sunflower, canola)  Soft or liquid margarines and vegetable oil spreads  Salad dressings Seeds and nuts  Avocado   Foods Not Recommended Food Group Foods Not Recommended  Grains Breads or crackers topped with salt Cereals (hot or cold) with more than 300 mg sodium per serving Biscuits, cornbread, and other "quick" breads prepared with baking soda Bread crumbs or stuffing mix from a store High-fat bakery products,  such as doughnuts, biscuits, croissants, danish pastries, pies, cookies Instant cooking foods to which you add hot water and stir--potatoes, noodles, rice, etc. Packaged starchy foods--seasoned noodle or rice dishes, stuffing mix, macaroni and cheese dinner Snacks made with partially hydrogenated oils, including chips, cheese puffs, snack mixes, regular crackers, butter-flavored popcorn  Protein Foods Higher-fat cuts of meats (ribs, t-bone steak, regular hamburger) Bacon, sausage, or hot dogs Cold cuts, such as salami or bologna, deli meats, cured meats, corned beef Organ meats (liver, brains, gizzards, sweetbreads) Poultry with skin Fried or smoked meat, poultry, and fish Whole eggs and egg yolks (more than 2-4 per week) Salted legumes, nuts, seeds, or nut/seed butters Meat alternatives with high levels of sodium (>300 mg per serving) or saturated fat (>5 g per serving)  Dairy Whole milk,?2% fat milk, buttermilk Whole milk yogurt or ice cream Cream Half-&-half Cream cheese Sour  cream Cheese  Vegetables Canned or frozen vegetables with salt, fresh vegetables prepared with salt, butter, cheese, or cream sauce Fried vegetables Pickled vegetables such as olives, pickles, or sauerkraut  Fruits Fried fruits Fruits served with butter or cream  Oils Butter, stick margarine, shortening Partially hydrogenated oils or trans fats Tropical oils (coconut, palm, palm kernel oils)  Other Candy, sugar sweetened soft drinks and desserts Salt, sea salt, garlic salt, and seasoning mixes containing salt Bouillon cubes Ketchup, barbecue sauce, Worcestershire sauce, soy sauce, teriyaki sauce Miso Salsa Pickles, olives, relish     Copyright 2020  Academy of Nutrition and Dietetics. All rights reserved.

## 2021-07-23 NOTE — Progress Notes (Addendum)
[address]       [place] [address]             [phone]      15 Days Post-Op Procedure(s) (LRB): CORONARY ARTERY BYPASS GRAFTING (CABG) TIMES FOUR USING LEFT INTERNAL MAMMARY ARTERY, GREATER SAPHENOUS VEIN HARVESTED ENDOSCOPICALLY, AND LEFT RADIAL ARTERY HARVESTED OPEN (N/A) TRANSESOPHAGEAL ECHOCARDIOGRAM (TEE) (N/A) RADIAL ARTERY HARVEST (Left) ENDOVEIN HARVEST OF GREATER SAPHENOUS VEIN (Right) MAZE Subjective: Feeling better overall  Objective: Vital signs in last 24 hours: Temp:  [97.1 F (36.2 C)-98.4 F (36.9 C)] 98.2 F (36.8 C) (11/09 0340) Pulse Rate:  [77-90] 78 (11/09 0340) Cardiac Rhythm: Normal sinus rhythm (11/09 0340) Resp:  [16-24] 20 (11/09 0340) BP: (97-146)/(51-82) 113/66 (11/09 0340) SpO2:  [92 %-97 %] 93 % (11/09 0340) Weight:  [105.7 kg-108.9 kg] 105.7 kg (11/09 0340)  Hemodynamic parameters for last 24 hours:    Intake/Output from previous day: 11/08 0701 - 11/09 0700 In: 1031.3 [P.O.:480; I.V.:116.9; NG/GT:60.5; IV Piggyback:373.9] Out: 3200 [Urine:1200] Intake/Output this shift: No intake/output data recorded.  General appearance: alert, cooperative, and no distress Heart: regular rate and rhythm Lungs: clear to auscultation bilaterally Abdomen: benign Extremities: no edema Wound: incis healing very well  Lab Results: Recent Labs    07/22/21 0054 07/23/21 0340  WBC 9.1 7.5  HGB 7.5* 7.3*  HCT 22.9* 22.5*  PLT 464* 428*   BMET:  Recent Labs    07/22/21 0054 07/23/21 0340  NA 132* 137  K 4.4 3.7  CL 98 101  CO2 22 26  GLUCOSE 140* 131*  BUN 80* 54*  CREATININE 6.50* 4.53*  CALCIUM 8.0* 8.0*    PT/INR: No results for input(s): LABPROT, INR in the last 72 hours. ABG    Component Value Date/Time   PHART 7.459 (H) 07/16/2021 1330   HCO3 24.8 07/16/2021 1330   TCO2 26 07/16/2021 1330   ACIDBASEDEF 9.0 (H) 07/10/2021 0303   O2SAT 52.1 07/23/2021 0340   CBG (last 3)  Recent Labs     07/22/21 2103 07/22/21 2359 07/23/21 0338  GLUCAP 203* 160* 132*    Meds Scheduled Meds:  ALPRAZolam  1 mg Oral TID   amiodarone  200 mg Oral Daily   aspirin  81 mg Oral Daily   Or   aspirin EC  81 mg Oral Daily   atorvastatin  80 mg Oral Daily   B-complex with vitamin C  1 tablet Oral Daily   calcium acetate  667 mg Oral TID WC   carvedilol  3.125 mg Oral BID WC   chlorhexidine  15 mL Mouth Rinse BID   Chlorhexidine Gluconate Cloth  6 each Topical Daily   clopidogrel  75 mg Oral Daily   [facility] Patient [facility] Family Education   Does not apply Once   [START ON 07/28/2021] darbepoetin (ARANESP) injection - NON-DIALYSIS  100 mcg Subcutaneous Q Mon-1800   feeding supplement (PROSource TF)  45 mL Per Tube QID   hydrALAZINE  12.5 mg Oral TID   insulin aspart  3-9 Units Subcutaneous TID WC & HS   insulin detemir  20 Units Subcutaneous BID   isosorbide dinitrate  10 mg Oral TID   mouth rinse  15 mL Mouth Rinse q12n4p   multivitamin with minerals  1 tablet Oral Daily   pantoprazole  40 mg Oral Daily   sertraline  50 mg Oral Daily   sodium chloride flush  3 mL Intravenous Q12H  traZODone  100 mg Oral QHS   Continuous Infusions:  heparin 600 Units/hr (07/23/21 0300)   promethazine (PHENERGAN) injection (IM or IVPB) Stopped (07/09/21 1628)   PRN Meds:.acetaminophen, albuterol, docusate, lip balm, metoprolol tartrate, ondansetron (ZOFRAN) IV, oxyCODONE, polyethylene glycol, promethazine (PHENERGAN) injection (IM or IVPB), sennosides, simethicone  Xrays IR US Guide Vasc Access Right  Result Date: 07/22/2021 INDICATION: 39 year old with acute kidney injury. Request for a tunneled dialysis catheter. Patient currently has a non tunneled left jugular catheter. EXAM: FLUOROSCOPIC AND ULTRASOUND GUIDED PLACEMENT OF A TUNNELED DIALYSIS CATHETER Physician: Stephan Minister. Anselm Pancoast, MD MEDICATIONS: Ancef 2 g; The antibiotic was administered within an appropriate time interval prior to skin  puncture. ANESTHESIA/SEDATION: Moderate (conscious) sedation was employed during this procedure. A total of Versed 2.0mg  and fentanyl 100 mcg was administered intravenously at the order of the provider performing the procedure. Total intra-service moderate sedation time: 25 minutes. Patient's level of consciousness and vital signs were monitored continuously by radiology nurse throughout the procedure under the supervision of the provider performing the procedure. FLUOROSCOPY TIME:  Fluoroscopy Time: 1 minute, 30 seconds, 13 mGy COMPLICATIONS: None immediate. PROCEDURE: Informed consent was obtained for placement of a tunneled dialysis catheter. The patient was placed supine on the interventional table. Ultrasound confirmed a patent right internal jugular vein. Ultrasound image obtained for documentation. The right neck and chest was prepped and draped in a sterile fashion. Maximal barrier sterile technique was utilized including caps, mask, sterile gowns, sterile gloves, sterile drape, hand hygiene and skin antiseptic. The right neck was anesthetized with 1% lidocaine. A small incision was made with #11 blade scalpel. A 21 gauge needle directed into the right internal jugular vein with ultrasound guidance. A micropuncture dilator set was placed. A 23 cm tip to cuff Palindrome catheter was selected. The skin below the right clavicle was anesthetized and a small incision was made with an #11 blade scalpel. A subcutaneous tunnel was formed to the vein dermatotomy site. The catheter was brought through the tunnel. The vein dermatotomy site was dilated to accommodate a peel-away sheath. The catheter was placed through the peel-away sheath and directed into the central venous structures. The tip of the catheter was placed at superior cavoatrial junction with fluoroscopy. Fluoroscopic images were obtained for documentation. Both lumens were found to aspirate and flush well. The proper amount of heparin was flushed in both  lumens. The vein dermatotomy site was closed using a single layer of absorbable suture and Dermabond. The catheter was secured to the skin using Prolene suture. IMPRESSION: Successful placement of a right jugular tunneled dialysis catheter using ultrasound and fluoroscopic guidance. Electronically Signed   By: Markus Daft M.D.   On: 07/22/2021 14:14    Assessment/Plan: S/P Procedure(s) (LRB): CORONARY ARTERY BYPASS GRAFTING (CABG) TIMES FOUR USING LEFT INTERNAL MAMMARY ARTERY, GREATER SAPHENOUS VEIN HARVESTED ENDOSCOPICALLY, AND LEFT RADIAL ARTERY HARVESTED OPEN (N/A) TRANSESOPHAGEAL ECHOCARDIOGRAM (TEE) (N/A) RADIAL ARTERY HARVEST (Left) ENDOVEIN HARVEST OF GREATER SAPHENOUS VEIN (Right) MAZE  1 Afeb, VSS, AHF managing cardiology med titrations- Co-Ox 52, maintaining SR on amio, hopefully can stop heparin gtt soon 2 sats ok on RA 3 UOP improving, renal issues/HD as per nephrology 4 anemia is stable 7.3/22.5- Iron/replacement therapies per nephrology- transfusion ordered per cardiology, he is having some lightheadedness with activity 5 placement attempts being made , prob won't be approved by insurance for CIR but will cancel SNF search to see if CIR will take as he would definitely benefit from going there first 6 will  need to transition to home DM meds  v cont insulin - cbg adeq controlled HG A1C 6.7 7 cont therapies -rehab modalities    LOS: 16 days    Graig Giovanni PA-C Pager [phone] 07/23/2021    Agree with above Dispo planning Will transition to plavix/eliquis.  Will stop aspirin  Syvilla Martin O Auden Wettstein

## 2021-07-23 NOTE — Progress Notes (Signed)
Made aware that patient's co-ox was 39% this afternoon.   Patient feels well this afternoon. Has been up ambulating halls. No significant dyspnea. Vitals stable.  Discussed with Dr. Aundra Dubin. Will not make any changes to current management.

## 2021-07-23 NOTE — Care Management Important Message (Signed)
Important Message  Patient Details  Name: Billy [name] MRN: [id number] Date of Birth: [birth year]   Medicare Important Message Given:  Yes     [place] Natarajan Montine Circle 07/23/2021, 9:21 AM

## 2021-07-23 NOTE — Progress Notes (Signed)
Nutrition Follow-up  DOCUMENTATION CODES:   Not applicable  INTERVENTION:   - Encourage PO intake  - Nepro Shake po BID, each supplement provides 425 kcal and 19 grams protein  - Provided brief diet education regarding the importance of increased protein intake related to acute illness and healing  - d/c MVI with minerals and B-complex with vitamin C and order renal MVI daily  NUTRITION DIAGNOSIS:   Increased nutrient needs related to acute illness (AKI requiring CRRT) as evidenced by estimated needs.  Progressing, being addressed via oral nutrition supplements (pt now off CRRT)  GOAL:   Patient will meet greater than or equal to 90% of their needs  Progressing  MONITOR:   PO intake, Supplement acceptance, Labs, Weight trends, Skin, I & O's  REASON FOR ASSESSMENT:   Ventilator, Consult Enteral/tube feeding initiation and management  ASSESSMENT:   55 year old male who presented to the ED on 10/24 with STEMI. PMH of atrial fibrillation, HTN, T2DM, depression, anxiety, CKD stage II.  10/24 - s/p LHC with PCI, found to have severe multivessel CAD 10/25 - s/p CABG x 4, Maze 10/26 - extubated 10/27 - intubated, CRRT start, trickle tube feeds started 10/30 - trickle tube feeds on hold due to concern for reflux of tube feeds 10/31 - Cortrak placed (tip post-pyloric), trickle tube feeds restarted, rectal tube placed 11/01 - extubated 11/03 - full liquid diet 11/04 - regular diet, Cortrak pulled back to 88 cm, tube feeding rate halved, CRRT stop 11/05 - Cortrak removed, first iHD 11/07 - transferred out of ICU 11/08 - s/p tunneled HD catheter placement  Last HD was on 11/08 with 2000 ml net UF.  Spoke with RN prior to entering pt's room. RN reports pt only ate pudding cup this morning due to concern about items on meal tray.  Spoke with pt at bedside. Explained that pt is on a diet that restricts carbohydrate, sodium, potassium, and phosphorus. Encouraged pt to consume  items on his meal tray as he will not be sent any food items that exceed his diet limitations. Pt expresses understanding and asked RD to reposition breakfast meal tray in front of him. Explained to pt that he currently has increased kcal and protein needs related to healing and acute illness. Pt states that he is willing to eat more now that he knows this.  Pt denies any N/V at this time. Pt denies any issues with bowel movements at this time. RD provided pt with a Nepro shake at time of visit. Pt willing to consume these between meals to aid in meeting kcal and protein needs. Pt reports consuming Premier Protein shakes PTA.  Admit weight: 115.7 kg Current weight: 105.7 kg  Meal Completion: 50-100%  Medications reviewed and include: B-complex with vitamin C, phoslo TID with meals, aranesp weekly, MVI with minerals daily, SSI, levemir 20 units BID, protonix  Labs reviewed: BUN 54, creatinine 4.53, phosphorus 5.0, hemoglobin 7.3 CBG's: 98-203 x 24 hours  UOP: 1200 ml x 24 hours I/O's: -6.1 L since admit  Diet Order:   Diet Order             Diet renal/carb modified with fluid restriction Diet-HS Snack? Nothing; Fluid restriction: 1200 mL Fluid; Room service appropriate? Yes; Fluid consistency: Thin  Diet effective now                   EDUCATION NEEDS:   Education needs have been addressed  Skin:  Skin Assessment: Skin Integrity Issues: Stage II:  R buttocks Incisions: chest, L leg, R arm  Last BM:  07/22/21  Height:   Ht Readings from Last 1 Encounters:  07/14/21 6\' 2"  (1.88 m)    Weight:   Wt Readings from Last 1 Encounters:  07/23/21 105.7 kg    Ideal Body Weight:  86.4 kg  BMI:  Body mass index is 29.92 kg/m.  Estimated Nutritional Needs:   Kcal:  2200-2500 kcals  Protein:  115-135 grams  Fluid:  1000 ml + UOP    Gustavus Bryant, MS, RD, LDN Inpatient Clinical Dietitian Please see AMiON for contact information.

## 2021-07-23 NOTE — Progress Notes (Signed)
Pt just back from a long walk. Pt excited and I congratulated him. Practiced IS, 1700-2000 ml. Encouraged use and we will f/u tomorrow. Leake 2:49 PM 07/23/2021

## 2021-07-23 NOTE — Progress Notes (Signed)
[facility] Progress Note   Subjective:  He had 1.2 liters uop over 11/8 charted.  Last HD on 11/8 with 2 kg UF.  He got a right IJ tunneled HD catheter on 11/8 with IR.  The nontunneled catheter was left in his right IJ  Review of systems:   denies shortness of breath Denies chest pain No n/v  Objective Vitals:   07/22/21 1707 07/22/21 1951 07/23/21 0003 07/23/21 0340  BP: (!) 97/51 118/69 (!) 108/53 113/66  Pulse: 84 85 80 78  Resp: 16 20 20 20   Temp: 98.4 F (36.9 C) 98 F (36.7 C) 98 F (36.7 C) 98.2 F (36.8 C)  TempSrc: Oral Oral Oral Oral  SpO2:  96% 92% 93%  Weight:    105.7 kg  Height:       Physical Exam    General adult male in bed in no acute distress HEENT normocephalic atraumatic extraocular movements intact sclera anicteric Neck supple trachea midline Lungs clear to auscultation bilaterally normal work of breathing at rest on room air Heart S1S2 no rub Abdomen soft nontender nondistended Extremities no edema  Psych normal mood and affect Neuro alert and oriented x 3 provides hx and follows commands GU no foley  Access: RIJ tunneled catheter and left IJ nontunneled catheter   Additional Objective Labs: Basic Metabolic Panel: Recent Labs  Lab 07/21/21 0055 07/22/21 0054 07/23/21 0340  NA 134* 132* 137  K 4.0 4.4 3.7  CL 100 98 101  CO2 24 22 26   GLUCOSE 77 140* 131*  BUN 59* 80* 54*  CREATININE 5.26* 6.50* 4.53*  CALCIUM 8.1* 8.0* 8.0*  PHOS 6.0* 7.7* 5.0*   Liver Function Tests: Recent Labs  Lab 07/21/21 0055 07/22/21 0054 07/23/21 0340  ALBUMIN 2.3* 2.3* 2.2*   CBC: Recent Labs  Lab 07/19/21 0313 07/20/21 0209 07/21/21 0055 07/22/21 0054 07/23/21 0340  WBC 15.7* 14.5* 11.6* 9.1 7.5  HGB 8.2* 8.3* 7.7* 7.5* 7.3*  HCT 25.4* 26.2* 23.9* 22.9* 22.5*  MCV 97.3 97.4 94.8 94.6 94.5  PLT 319 398 381 464* 428*   Blood Culture    Component Value Date/Time   SDES TRACHEAL ASPIRATE 07/12/2021 0817   SPECREQUEST  NONE 07/12/2021 0817   CULT  07/12/2021 0817    Normal respiratory flora-no Staph aureus or Pseudomonas seen Performed at [facility], [address] [place],  [address]    REPTSTATUS 07/14/2021 FINAL 07/12/2021 0817   CBG: Recent Labs  Lab 07/22/21 1151 07/22/21 1709 07/22/21 2103 07/22/21 2359 07/23/21 0338  GLUCAP 94 178* 203* 160* 132*    Studies/Results: IR US Guide Vasc Access Right  Result Date: 07/22/2021 INDICATION: 55 year old with acute kidney injury. Request for a tunneled dialysis catheter. Patient currently has a non tunneled left jugular catheter. EXAM: FLUOROSCOPIC AND ULTRASOUND GUIDED PLACEMENT OF A TUNNELED DIALYSIS CATHETER Physician: Stephan Minister. Anselm Pancoast, MD MEDICATIONS: Ancef 2 g; The antibiotic was administered within an appropriate time interval prior to skin puncture. ANESTHESIA/SEDATION: Moderate (conscious) sedation was employed during this procedure. A total of Versed 2.0mg  and fentanyl 100 mcg was administered intravenously at the order of the provider performing the procedure. Total intra-service moderate sedation time: 25 minutes. Patient's level of consciousness and vital signs were monitored continuously by radiology nurse throughout the procedure under the supervision of the provider performing the procedure. FLUOROSCOPY TIME:  Fluoroscopy Time: 1 minute, 30 seconds, 13 mGy COMPLICATIONS: None immediate. PROCEDURE: Informed consent was obtained for placement of a tunneled dialysis catheter.  The patient was placed supine on the interventional table. Ultrasound confirmed a patent right internal jugular vein. Ultrasound image obtained for documentation. The right neck and chest was prepped and draped in a sterile fashion. Maximal barrier sterile technique was utilized including caps, mask, sterile gowns, sterile gloves, sterile drape, hand hygiene and skin antiseptic. The right neck was anesthetized with 1% lidocaine. A small incision was made with #11 blade  scalpel. A 21 gauge needle directed into the right internal jugular vein with ultrasound guidance. A micropuncture dilator set was placed. A 23 cm tip to cuff Palindrome catheter was selected. The skin below the right clavicle was anesthetized and a small incision was made with an #11 blade scalpel. A subcutaneous tunnel was formed to the vein dermatotomy site. The catheter was brought through the tunnel. The vein dermatotomy site was dilated to accommodate a peel-away sheath. The catheter was placed through the peel-away sheath and directed into the central venous structures. The tip of the catheter was placed at superior cavoatrial junction with fluoroscopy. Fluoroscopic images were obtained for documentation. Both lumens were found to aspirate and flush well. The proper amount of heparin was flushed in both lumens. The vein dermatotomy site was closed using a single layer of absorbable suture and Dermabond. The catheter was secured to the skin using Prolene suture. IMPRESSION: Successful placement of a right jugular tunneled dialysis catheter using ultrasound and fluoroscopic guidance. Electronically Signed   By: Markus Daft M.D.   On: 07/22/2021 14:14   Medications:  heparin 600 Units/hr (07/23/21 0300)   promethazine (PHENERGAN) injection (IM or IVPB) Stopped (07/09/21 1628)    ALPRAZolam  1 mg Oral TID   amiodarone  200 mg Oral Daily   aspirin  81 mg Oral Daily   Or   aspirin EC  81 mg Oral Daily   atorvastatin  80 mg Oral Daily   B-complex with vitamin C  1 tablet Oral Daily   calcium acetate  667 mg Oral TID WC   carvedilol  3.125 mg Oral BID WC   chlorhexidine  15 mL Mouth Rinse BID   Chlorhexidine Gluconate Cloth  6 each Topical Daily   clopidogrel  75 mg Oral Daily   [facility], Patient & Family Education   Does not apply Once   [START ON 07/28/2021] darbepoetin (ARANESP) injection - NON-DIALYSIS  40 mcg Subcutaneous Q Mon-1800   feeding supplement (PROSource TF)  45 mL  Per Tube QID   hydrALAZINE  12.5 mg Oral TID   insulin aspart  3-9 Units Subcutaneous TID WC & HS   insulin detemir  20 Units Subcutaneous BID   isosorbide dinitrate  10 mg Oral TID   mouth rinse  15 mL Mouth Rinse q12n4p   multivitamin with minerals  1 tablet Oral Daily   pantoprazole  40 mg Oral Daily   sertraline  50 mg Oral Daily   sodium chloride flush  3 mL Intravenous Q12H   traZODone  100 mg Oral QHS     Assessment/Plan: 55yo M DM, HTN, A fib, CKD currently being followed for severe AKI s/p CABG x 4 and MAZE.   # Severe AKI on CKD 3b:   - Baseline 1.6 (Followed by Dr. Moshe Cipro outpt), was 1.9 prior to CABG  - AKI in setting of hemodynamic instability post op likely ATN but cholesterol embolic injury possible.  Initiated CRRT 10/27 for UF with ~anuria and volume overload.  Off of CRRT on 11/4 then iHD on  11/6.  Right IJ tunneled HD catheter on 11/8 with IR ---------------------- - No acute indication for HD today.  Hopeful for improvement over time given his urine output.   - Assess for HD needs daily and anticipate next tx on 11/10 - Continue Foley catheter  - will place order to remove the nontunneled catheter left IJ - was left in  # Anemia: iron deficient. S/p Ferrelecit .  Transfuse as needed.  aranesp 40 mcg given on 11/7 and will increase next dose to 100 mcg for 11/14  # Urinary retention - Foley catheter in place for retention on two bladder scans  # Hyponatremia: Has improved with volume removal/RRT  # CKD stage 3b - predates admission - Baseline Cr 1.6 and follows with Dr. Moshe Cipro outpatient  # CAD s/p CABG  # A fib: amiodarone, anticoagulation per primary  # DM: per primary  # Hyperphosphatemia - renal diet  - for HD  - improved on phoslo with meals for now   # Shock: No longer requiring norepinephrine or dobutamine.  Resolved  # Pneumonia: s/p treatment   Claudia Desanctis, MD 07/23/2021 6:29 AM

## 2021-07-23 NOTE — Evaluation (Addendum)
Occupational Therapy Evaluation Patient Details Name: Billy [name] MRN: [id number] DOB: November 17, 1965 Today's Date: 07/23/2021   History of Present Illness Pt is a 55 y.o. male admitted 07/07/21 with STEMI. S/p LHC with PCA. S/p CABGx4 with MAZE 10/25. Extubated post-op on 10/26; reintubated 10/27-11/1. On CRRT 10/27-11/4. iHD initiated 11/6. S/p R IJ tunneled HD cath placement 11/8. PMH includes visual impairment, afib, HTN, DM, R toe amputation, neuropathy, anxiety.   Clinical Impression   Pt educated in sternal precautions related to ADL and IADL and in energy conservation strategies. Pt receptive to all information and is highly motivated to return to independence. Stood with cues and min guard assist x 2 and at sink for 2 grooming activities. Pt demonstrated ability to don socks with set up at EOB. Cues needed for pt to plan movement and monitor for symptoms of hypotension. Pt can likely return home with initial 24 hour care, updated d/c recommendation and goals.      Recommendations for follow up therapy are one component of a multi-disciplinary discharge planning process, led by the attending physician.  Recommendations may be updated based on patient status, additional functional criteria and insurance authorization.   Follow Up Recommendations  Home Health OT    Assistance Recommended at Discharge Frequent or constant Supervision/Assistance  Functional Status Assessment     Equipment Recommendations  None recommended by OT    Recommendations for Other Services       Precautions / Restrictions Precautions Precautions: Fall;Other (comment);Sternal Precaution Comments: visually impaired Restrictions Weight Bearing Restrictions: No Other Position/Activity Restrictions: sternal precautions      Mobility Bed Mobility Overal bed mobility: Needs Assistance Bed Mobility: Supine to Sit     Supine to sit: HOB elevated;Supervision         Transfers Overall transfer level:  Needs assistance Equipment used: RW Transfers: Sit to/from Stand Sit to Stand: Min guard                 Balance Overall balance assessment: Needs assistance Sitting-balance support: Feet supported;No upper extremity supported Sitting balance-Leahy Scale: Good Sitting balance - Comments: Pt able to static sit EOB without physical assist   Standing balance support: Reliant on assistive device for balance;Bilateral upper extremity supported Standing balance-Leahy Scale: Poor Standing balance comment: min guard for safety, fair standing balance at sink                           ADL either performed or assessed with clinical judgement   ADL Overall ADL's : Needs assistance/impaired     Grooming: Oral care;Wash/dry hands;Standing;Min guard           Upper Body Dressing : Set up;Sitting Upper Body Dressing Details (indicate cue type and reason): front opening gown Lower Body Dressing: Set up;Sitting/lateral leans Lower Body Dressing Details (indicate cue type and reason): able to don socks crossing foot over opposite knee Toilet Transfer: Minimal assistance;Ambulation;Comfort height toilet;Rolling walker (2 wheels)   Toileting- Clothing Manipulation and Hygiene: Minimal assistance;Sit to/from stand       Functional mobility during ADLs: Minimal assistance;Rolling walker (2 wheels)       Vision         Perception     Praxis      Pertinent Vitals/Pain Pain Assessment: Faces Faces Pain Scale: Hurts little more Pain Location: chest incision with coughing Pain Descriptors / Indicators: Sore;Guarding;Grimacing Pain Intervention(s): Monitored during session     Hand Dominance  Extremity/Trunk Assessment             Communication     Cognition Arousal/Alertness: Awake/alert Behavior During Therapy: WFL for tasks assessed/performed Overall Cognitive Status: Impaired/Different from baseline Area of Impairment: Following commands;Memory                      Memory: Decreased recall of precautions       Problem Solving: Slow processing      General Comments      Exercises     Shoulder Instructions      Home Living                                          Prior Functioning/Environment                          OT Problem List:        OT Treatment/Interventions:      OT Goals(Current goals can be found in the care plan section) Acute Rehab OT Goals OT Goal Formulation: With family Time For Goal Achievement: 07/30/21 Potential to Achieve Goals: Good ADL Goals Pt Will Perform Grooming: with supervision;standing Pt Will Perform Upper Body Dressing: with set-up;sitting Pt Will Perform Lower Body Dressing: (P) with supervision;sit to/from stand Pt Will Transfer to Toilet: (P) with supervision;ambulating;regular height toilet Pt Will Perform Toileting - Clothing Manipulation and hygiene: (P) with supervision;sit to/from stand Additional ADL Goal #1: Pt will perform generalize sternal precautions. Additional ADL Goal #2: Pt will state at least 3 energy conservation strategies as instructed.  OT Frequency: Min 2X/week   Barriers to D/C:            Co-evaluation              AM-PAC OT "6 Clicks" Daily Activity     Outcome Measure Help from another person eating meals?: None Help from another person taking care of personal grooming?: A Little Help from another person toileting, which includes using toliet, bedpan, or urinal?: A Little Help from another person bathing (including washing, rinsing, drying)?: A Little Help from another person to put on and taking off regular upper body clothing?: A Little Help from another person to put on and taking off regular lower body clothing?: A Little 6 Click Score: 19   End of Session Equipment Utilized During Treatment: Rolling walker (2 wheels)  Activity Tolerance: Patient tolerated treatment well Patient left: in bed;with  call bell/phone within reach;with bed alarm set  OT Visit Diagnosis: Muscle weakness (generalized) (M62.81);Other symptoms and signs involving cognitive function;Pain                Time: 9373-4287 OT Time Calculation (min): 28 min Charges:  OT General Charges $OT Visit: 1 Visit OT Treatments $Self Care/Home Management : 23-37 mins  [name], OTR/L Acute Rehabilitation Services Pager: [phone] Office: [phone]  [name] 07/23/2021, 2:07 PM

## 2021-07-23 NOTE — Progress Notes (Signed)
Inpatient Rehab Admissions Coordinator:   I met with Pt. At bedside and observed session with mobility specialist. Pt. Stood without assistance and walked ~400 ft with supervision for most of walk, occasional min guard when static standing to rest. I do not think Pt. Requires intensity of CIR at this time, though he may benefit from rehab at a lower level of care. I will not pursue for CIR admission at this time. Pt. Is in agreement and expressed interest in discharging home with home health.   Clemens Catholic, [place], [place] Admissions Coordinator  [phone] ([place]) [phone] (office)

## 2021-07-23 NOTE — Progress Notes (Signed)
Physical Therapy Treatment Patient Details Name: Billy [name] MRN: [id number] DOB: 06/18/1966 Today's Date: 07/23/2021   History of Present Illness Pt is a 55 y.o. male admitted 07/07/21 with STEMI. S/p LHC with PCA. S/p CABGx4 with MAZE 10/25. Extubated post-op on 10/26; reintubated 10/27-11/1. On CRRT 10/27-11/4. iHD initiated 11/6. S/p R IJ tunneled HD cath placement 11/8. PMH includes visual impairment, afib, HTN, DM, R toe amputation, neuropathy, anxiety.    PT Comments    Pt is progressing well with mobility. Today's session focused on transfers and ambulation. Pt ambulated in hallway with rollator and min guard with no complaints of fatigue or SOB. However, session was limited by symptomatic orthostatic hypotension. Recommend CIR after discharge to continue to increase functional mobility, strength, and activity tolerance to maximize independence before pt returns home where he lives alone. Will continue to follow acutely to progress towards short-term PT goals. If pt to return home, would need increased assist from family/caregivers and [facility] services.  Orthostatic BPs Supine 129/76  Sitting 128/80  Standing 94/60  Standing after 2 min   Post-ambulation 107/57     Recommendations for follow up therapy are one component of a multi-disciplinary discharge planning process, led by the attending physician.  Recommendations may be updated based on patient status, additional functional criteria and insurance authorization.  Follow Up Recommendations  Acute inpatient rehab (3hours/day)     Assistance Recommended at Discharge Intermittent Supervision/Assistance  Equipment Recommendations  Rollator (4 wheels)    Recommendations for Other Services       Precautions / Restrictions Precautions Precautions: Fall;Other (comment);Sternal Precaution Comments: visually impaired Restrictions Weight Bearing Restrictions: No Other Position/Activity Restrictions: sternal precautions      Mobility  Bed Mobility Overal bed mobility: Needs Assistance Bed Mobility: Supine to Sit     Supine to sit: HOB elevated;Supervision     General bed mobility comments: Supervision for safety due to orthostatic hypotension symptoms    Transfers Overall transfer level: Needs assistance Equipment used: Rollator (4 wheels) Transfers: Sit to/from Stand Sit to Stand: Min guard           General transfer comment: Pt's first attempt to stand was unsuccessful, but adjusted feet and was able to stand from EOB with min guard for safety; cues needed to maintain sternal percautions    Ambulation/Gait Ambulation/Gait assistance: Min guard Gait Distance (Feet): 100 Feet Assistive device: Rollator (4 wheels) Gait Pattern/deviations: Step-through pattern;Decreased stride length;Narrow base of support       General Gait Details: Min gaurd for balance and safety; 1 loss of balance but pt was able to recover   Stairs             Wheelchair Mobility    Modified Rankin (Stroke Patients Only)       Balance Overall balance assessment: Needs assistance Sitting-balance support: Feet supported;No upper extremity supported Sitting balance-Leahy Scale: Good Sitting balance - Comments: Pt able to static sit EOB without physical assist   Standing balance support: Reliant on assistive device for balance;Bilateral upper extremity supported Standing balance-Leahy Scale: Poor Standing balance comment: Used rollator during static standing and ambulation; min gaurd for safety                            Cognition Arousal/Alertness: Awake/alert Behavior During Therapy: WFL for tasks assessed/performed Overall Cognitive Status: Within Functional Limits for tasks assessed  Memory: Decreased short-term memory;Decreased recall of precautions         General Comments: Pt was impulsive with mobility at first but was receptive to cues to slow  down; needed cues to maintain sternal percautions at first but no longer needed cues at the end of session        Exercises      General Comments General comments (skin integrity, edema, etc.): monitored for orthostatic hypotension (see measurements); pt consistently complained of dizziness when standing      Pertinent Vitals/Pain Pain Assessment: Faces Faces Pain Scale: No hurt    Home Living                          Prior Function            PT Goals (current goals can now be found in the care plan section) Acute Rehab PT Goals Patient Stated Goal: get home for Thanksgiving PT Goal Formulation: With patient Time For Goal Achievement: 08/04/21 Potential to Achieve Goals: Good Progress towards PT goals: Progressing toward goals    Frequency    Min 3X/week      PT Plan Discharge plan needs to be updated    Co-evaluation              AM-PAC PT "6 Clicks" Mobility   Outcome Measure  Help needed turning from your back to your side while in a flat bed without using bedrails?: A Little Help needed moving from lying on your back to sitting on the side of a flat bed without using bedrails?: A Little Help needed moving to and from a bed to a chair (including a wheelchair)?: A Little Help needed standing up from a chair using your arms (e.g., wheelchair or bedside chair)?: A Little Help needed to walk in hospital room?: A Little Help needed climbing 3-5 steps with a railing? : A Lot 6 Click Score: 17    End of Session Equipment Utilized During Treatment: Gait belt Activity Tolerance: Patient tolerated treatment well Patient left: in chair;with call bell/phone within reach (with lunch tray) Nurse Communication: Mobility status PT Visit Diagnosis: Other abnormalities of gait and mobility (R26.89);Muscle weakness (generalized) (M62.81);Unsteadiness on feet (R26.81)     Time: 1030-1314 PT Time Calculation (min) (ACUTE ONLY): 31 min  Charges:  $Gait  Training: 8-22 mins $Therapeutic Activity: 8-22 mins                     Brandon Melnick, SPT    Brandon Melnick 07/23/2021, 12:59 PM

## 2021-07-23 NOTE — Progress Notes (Signed)
Mobility Specialist Progress Note    07/23/21 1431  Mobility  Activity Ambulated in hall  Level of Assistance Standby assist, set-up cues, supervision of patient - no hands on  Assistive Device Four wheel walker  Distance Ambulated (ft) 410 ft  Mobility Ambulated with assistance in hallway  Mobility Response Tolerated well  Mobility performed by Mobility specialist  $Mobility charge 1 Mobility   Pre-Mobility: 84 HR, 124/73 BP, 94% SpO2 Post-Mobility: 88 HR, 112/66 BP, 98% SpO2  Pt received sitting EOB and agreeable. C/o some lightheadedness on walk. Took x2 standing breaks. Returned to sitting EOB with call bell in reach.   Hildred Alamin Mobility Specialist  Mobility Specialist Phone: [phone]

## 2021-07-24 ENCOUNTER — Other Ambulatory Visit: Payer: Self-pay

## 2021-07-24 DIAGNOSIS — I5022 Chronic systolic (congestive) heart failure: Secondary | ICD-10-CM | POA: Diagnosis not present

## 2021-07-24 LAB — GLUCOSE, CAPILLARY
Glucose-Capillary: 119 mg/dL — ABNORMAL HIGH (ref 70–99)
Glucose-Capillary: 125 mg/dL — ABNORMAL HIGH (ref 70–99)
Glucose-Capillary: 136 mg/dL — ABNORMAL HIGH (ref 70–99)
Glucose-Capillary: 203 mg/dL — ABNORMAL HIGH (ref 70–99)
Glucose-Capillary: 196 mg/dL — ABNORMAL HIGH (ref 70–99)

## 2021-07-24 LAB — RENAL FUNCTION PANEL
Albumin: 2.3 g/dL — ABNORMAL LOW (ref 3.5–5.0)
Anion gap: 8 (ref 5–15)
BUN: 66 mg/dL — ABNORMAL HIGH (ref 6–20)
CO2: 26 mmol/L (ref 22–32)
Calcium: 8 mg/dL — ABNORMAL LOW (ref 8.9–10.3)
Chloride: 102 mmol/L (ref 98–111)
Creatinine, Ser: 4.79 mg/dL — ABNORMAL HIGH (ref 0.61–1.24)
GFR, Estimated: 13 mL/min — ABNORMAL LOW (ref >60)
Glucose, Bld: 138 mg/dL — ABNORMAL HIGH (ref 70–99)
Phosphorus: 4.8 mg/dL — ABNORMAL HIGH (ref 2.5–4.6)
Potassium: 3.8 mmol/L (ref 3.5–5.1)
Sodium: 136 mmol/L (ref 135–145)

## 2021-07-24 LAB — CBC
HCT: 24 % — ABNORMAL LOW (ref 39.0–52.0)
Hemoglobin: 8 g/dL — ABNORMAL LOW (ref 13.0–17.0)
MCH: 31.4 pg (ref 26.0–34.0)
MCHC: 33.3 g/dL (ref 30.0–36.0)
MCV: 94.1 fL (ref 80.0–100.0)
Platelets: 438 10*3/uL — ABNORMAL HIGH (ref 150–400)
RBC: 2.55 MIL/uL — ABNORMAL LOW (ref 4.22–5.81)
RDW: 14.3 % (ref 11.5–15.5)
WBC: 8.8 10*3/uL (ref 4.0–10.5)
nRBC: 0 % (ref 0.0–0.2)

## 2021-07-24 LAB — TYPE AND SCREEN
ABO/RH(D): O NEG
Antibody Screen: NEGATIVE
Unit division: 0

## 2021-07-24 LAB — BPAM RBC
Blood Product Expiration Date: 202211252359
ISSUE DATE / TIME: 202211090956
Unit Type and Rh: 9500

## 2021-07-24 LAB — MAGNESIUM: Magnesium: 2.1 mg/dL (ref 1.7–2.4)

## 2021-07-24 MED ORDER — HEPARIN SODIUM (PORCINE) 1000 UNIT/ML IJ SOLN
INTRAMUSCULAR | Status: AC
  Filled 2021-07-24: qty 4

## 2021-07-24 NOTE — Procedures (Signed)
Seen and examined on dialysis.  Procedure supervised. Blood pressure 131/77 and HR 80 on HD. BF 400. RIJ tunn catheter in use.  Tolerating goal.    Claudia Desanctis, MD 07/24/2021 9:07 AM

## 2021-07-24 NOTE — Progress Notes (Signed)
Pt endorsing left sided chest pain after ambulation is PT. Pt appears anxious. Pt missed morning dose of xanax and BP was too soft for administration at 1400. BP rechecked 112/62.  Xanax administered. EKG done at bedside to rule out cardiac involvement. Pt pain relieved with xanax, rest, repositioning.

## 2021-07-24 NOTE — Progress Notes (Signed)
Pt BP has been soft all afternoon after administration of post HD meds. [facility] Night APP paged [phone]. [facility] Angie called back [phone]. Okay to give Coreg; Retime Hydralazine and Isordil admin for 8pm with BP recheck.

## 2021-07-24 NOTE — Progress Notes (Addendum)
[address]       [place] [address]             [phone]      16 Days Post-Op Procedure(s) (LRB): CORONARY ARTERY BYPASS GRAFTING (CABG) TIMES FOUR USING LEFT INTERNAL MAMMARY ARTERY, GREATER SAPHENOUS VEIN HARVESTED ENDOSCOPICALLY, AND LEFT RADIAL ARTERY HARVESTED OPEN (N/A) TRANSESOPHAGEAL ECHOCARDIOGRAM (TEE) (N/A) RADIAL ARTERY HARVEST (Left) ENDOVEIN HARVEST OF GREATER SAPHENOUS VEIN (Right) MAZE Subjective: Conts to feel better overall  Objective: Vital signs in last 24 hours: Temp:  [97.7 F (36.5 C)-99 F (37.2 C)] 98 F (36.7 C) (11/10 0340) Pulse Rate:  [73-82] 77 (11/10 0340) Cardiac Rhythm: Normal sinus rhythm (11/10 0345) Resp:  [13-26] 20 (11/10 0340) BP: (114-137)/(55-74) 134/64 (11/10 0340) SpO2:  [93 %-98 %] 95 % (11/10 0340) Weight:  [107.3 kg] 107.3 kg (11/10 0340)  Hemodynamic parameters for last 24 hours:    Intake/Output from previous day: 11/09 0701 - 11/10 0700 In: 543 [I.V.:6; Blood:300; NG/GT:237] Out: 1000 [Urine:1000] Intake/Output this shift: Total I/O In: 0  Out: 1000 [Urine:1000]  General appearance: alert, cooperative, and no distress Heart: regular rate and rhythm Lungs: some coarseness left base, improves with cough Abdomen: benign Extremities: non pitting ankle edema Wound: incis healing well  Lab Results: Recent Labs    07/23/21 0340 07/23/21 1447 07/24/21 0223  WBC 7.5  --  8.8  HGB 7.3* 9.0* 8.0*  HCT 22.5* 27.0* 24.0*  PLT 428*  --  438*   BMET:  Recent Labs    07/23/21 0340 07/24/21 0223  NA 137 136  K 3.7 3.8  CL 101 102  CO2 26 26  GLUCOSE 131* 138*  BUN 54* 66*  CREATININE 4.53* 4.79*  CALCIUM 8.0* 8.0*    PT/INR: No results for input(s): LABPROT, INR in the last 72 hours. ABG    Component Value Date/Time   PHART 7.459 (H) 07/16/2021 1330   HCO3 24.8 07/16/2021 1330   TCO2 26 07/16/2021 1330   ACIDBASEDEF 9.0 (H) 07/10/2021 0303   O2SAT 38.3 07/23/2021 1447   CBG  (last 3)  Recent Labs    07/23/21 1955 07/23/21 2108 07/24/21 0622  GLUCAP 218* 244* 119*    Meds Scheduled Meds:  ALPRAZolam  1 mg Oral TID   amiodarone  200 mg Oral Daily   apixaban  5 mg Oral BID   atorvastatin  80 mg Oral Daily   calcium acetate  667 mg Oral TID WC   carvedilol  3.125 mg Oral BID WC   chlorhexidine  15 mL Mouth Rinse BID   Chlorhexidine Gluconate Cloth  6 each Topical Daily   Chlorhexidine Gluconate Cloth  6 each Topical Q0600   clopidogrel  75 mg Oral Daily   [place] [facility], Patient & Family Education   Does not apply Once   [START ON 07/28/2021] darbepoetin (ARANESP) injection - NON-DIALYSIS  100 mcg Subcutaneous Q Mon-1800   feeding supplement (NEPRO CARB STEADY)  237 mL Oral BID BM   hydrALAZINE  12.5 mg Oral TID   insulin aspart  3-9 Units Subcutaneous TID WC & HS   insulin detemir  20 Units Subcutaneous BID   isosorbide dinitrate  10 mg Oral TID   mouth rinse  15 mL Mouth Rinse q12n4p   multivitamin  1 tablet Oral QHS   pantoprazole  40 mg Oral Daily   sertraline  50 mg Oral Daily   sodium chloride flush  3 mL Intravenous  Q12H   traZODone  100 mg Oral QHS   Continuous Infusions:  promethazine (PHENERGAN) injection (IM or IVPB) Stopped (07/09/21 1628)   PRN Meds:.acetaminophen, albuterol, docusate, lip balm, metoprolol tartrate, ondansetron (ZOFRAN) IV, oxyCODONE, polyethylene glycol, promethazine (PHENERGAN) injection (IM or IVPB), sennosides, simethicone  Xrays IR US Guide Vasc Access Right  Result Date: 07/22/2021 INDICATION: 55 year old with acute kidney injury. Request for a tunneled dialysis catheter. Patient currently has a non tunneled left jugular catheter. EXAM: FLUOROSCOPIC AND ULTRASOUND GUIDED PLACEMENT OF A TUNNELED DIALYSIS CATHETER Physician: Stephan Minister. Anselm Pancoast, MD MEDICATIONS: Ancef 2 g; The antibiotic was administered within an appropriate time interval prior to skin puncture. ANESTHESIA/SEDATION: Moderate (conscious)  sedation was employed during this procedure. A total of Versed 2.0mg  and fentanyl 100 mcg was administered intravenously at the order of the provider performing the procedure. Total intra-service moderate sedation time: 25 minutes. Patient's level of consciousness and vital signs were monitored continuously by radiology nurse throughout the procedure under the supervision of the provider performing the procedure. FLUOROSCOPY TIME:  Fluoroscopy Time: 1 minute, 30 seconds, 13 mGy COMPLICATIONS: None immediate. PROCEDURE: Informed consent was obtained for placement of a tunneled dialysis catheter. The patient was placed supine on the interventional table. Ultrasound confirmed a patent right internal jugular vein. Ultrasound image obtained for documentation. The right neck and chest was prepped and draped in a sterile fashion. Maximal barrier sterile technique was utilized including caps, mask, sterile gowns, sterile gloves, sterile drape, hand hygiene and skin antiseptic. The right neck was anesthetized with 1% lidocaine. A small incision was made with #11 blade scalpel. A 21 gauge needle directed into the right internal jugular vein with ultrasound guidance. A micropuncture dilator set was placed. A 23 cm tip to cuff Palindrome catheter was selected. The skin below the right clavicle was anesthetized and a small incision was made with an #11 blade scalpel. A subcutaneous tunnel was formed to the vein dermatotomy site. The catheter was brought through the tunnel. The vein dermatotomy site was dilated to accommodate a peel-away sheath. The catheter was placed through the peel-away sheath and directed into the central venous structures. The tip of the catheter was placed at superior cavoatrial junction with fluoroscopy. Fluoroscopic images were obtained for documentation. Both lumens were found to aspirate and flush well. The proper amount of heparin was flushed in both lumens. The vein dermatotomy site was closed using  a single layer of absorbable suture and Dermabond. The catheter was secured to the skin using Prolene suture. IMPRESSION: Successful placement of a right jugular tunneled dialysis catheter using ultrasound and fluoroscopic guidance. Electronically Signed   By: Markus Daft M.D.   On: 07/22/2021 14:14    Assessment/Plan: S/P Procedure(s) (LRB): CORONARY ARTERY BYPASS GRAFTING (CABG) TIMES FOUR USING LEFT INTERNAL MAMMARY ARTERY, GREATER SAPHENOUS VEIN HARVESTED ENDOSCOPICALLY, AND LEFT RADIAL ARTERY HARVESTED OPEN (N/A) TRANSESOPHAGEAL ECHOCARDIOGRAM (TEE) (N/A) RADIAL ARTERY HARVEST (Left) ENDOVEIN HARVEST OF GREATER SAPHENOUS VEIN (Right) MAZE  1 afeb, VSS sinus rhythm- AHF managing cardiol med titrations- Co-Ox pending, did dip to 38 yesterday afternoon if accurate. ACRX /antiplatelet RX as outlined 2 sats good on RA 3 HD today, good UOP, creat fairly stable- conts nephrology management 4 H/H trend improved after unit of PRBC's yesterday 5 will need SNF short term for rehab and lives alone, didn't qualify for CIR 6 push rehab and pulm toilet as able- improving daily    LOS: 17 days    Carston Giovanni PA-C Pager [phone] 07/24/2021  Agree with above. Dispo planning.  Wilhelmina Hark [name]

## 2021-07-24 NOTE — Progress Notes (Signed)
Physical Therapy Treatment Patient Details Name: Billy [name] MRN: [id number] DOB: September 19, 1965 Today's Date: 07/24/2021   History of Present Illness Pt is a 55 y.o. male admitted 07/07/21 with STEMI. S/p LHC with PCA. S/p CABGx4 with MAZE 10/25. Extubated post-op on 10/26; reintubated 10/27-11/1. On CRRT 10/27-11/4. iHD initiated 11/6. S/p R IJ tunneled HD cath placement 11/8. PMH includes visual impairment, afib, HTN, DM, R toe amputation, neuropathy, anxiety.    PT Comments    The pt was eager to mobilize, and able to demo multiple sit-stand transfers without assist and with good adherence to sternal precautions. Despite ambulation with cardiac rehab earlier today, pt reports onset of dizziness, sweating, and had significant BP drop after ~74ft ft ambulation (see below). He then completed ambulation back to room and BP improved with return to sitting. Pt attempted additional sit-stand but had even further drop in BP requiring cessation of session.   VITALS:  - standing at start of session - BP: 116/66 (80); - standing mid-ambulation- BP: 85/55 (63);  - standing after ambulation - BP: 98/55 (67);  - sitting EOB - 96/62 (73);  - standing EOB - BP: 75/48 (57);  - supine at end of session - 114/67 (80);     Recommendations for follow up therapy are one component of a multi-disciplinary discharge planning process, led by the attending physician.  Recommendations may be updated based on patient status, additional functional criteria and insurance authorization.  Follow Up Recommendations  Skilled nursing-short term rehab (<3 hours/day) (denied from CIR)     Assistance Recommended at Discharge Intermittent Supervision/Assistance  Equipment Recommendations  Rollator (4 wheels)    Recommendations for Other Services       Precautions / Restrictions Precautions Precautions: Fall;Other (comment);Sternal Precaution Comments: visually impaired Restrictions Weight Bearing Restrictions:  No Other Position/Activity Restrictions: sternal precautions     Mobility  Bed Mobility Overal bed mobility: Needs Assistance Bed Mobility: Supine to Sit;Sit to Supine     Supine to sit: Modified independent (Device/Increase time) Sit to supine: Modified independent (Device/Increase time)   General bed mobility comments: pt able to return to bed without assist    Transfers Overall transfer level: Needs assistance Equipment used: None Transfers: Sit to/from Stand Sit to Stand: Supervision           General transfer comment: cues for hand placement, but able to power up without assist    Ambulation/Gait Ambulation/Gait assistance: Min guard;Min assist Gait Distance (Feet): 75 Feet Assistive device: Rollator (4 wheels) Gait Pattern/deviations: Step-through pattern;Decreased stride length;Narrow base of support Gait velocity: decr Gait velocity interpretation: <1.31 ft/sec, indicative of household ambulator   General Gait Details: pt with onset of dizziness and claminess with mobility. returned to room and further mobility limited by soft BP     Balance Overall balance assessment: Needs assistance Sitting-balance support: Feet supported;No upper extremity supported Sitting balance-Leahy Scale: Good Sitting balance - Comments: Pt able to static sit EOB without physical assist   Standing balance support: Reliant on assistive device for balance;Bilateral upper extremity supported Standing balance-Leahy Scale: Poor Standing balance comment: Used rollator during static standing and ambulation; min gaurd for safety                            Cognition Arousal/Alertness: Awake/alert Behavior During Therapy: WFL for tasks assessed/performed Overall Cognitive Status: Impaired/Different from baseline Area of Impairment: Following commands;Memory  Memory: Decreased recall of precautions Following Commands: Follows one step commands  with increased time       General Comments: pt eager to mobilize, but needing cues and direction when reporting sx of dizziness, claminess, and room spinning        Exercises      General Comments General comments (skin integrity, edema, etc.): BP 116/66 initially in standing, dropped to 85/55 after 45 ft ambulation, low of 75/48 (59) after second stand so pt returned to supine. after PT left room, pt calling out to RN for assist with c/o chest pain L upper chest with continued sweating      Pertinent Vitals/Pain Pain Assessment: Faces Faces Pain Scale: Hurts little more Pain Location: chest incision, after session pt c/o chest pain on upper L side Pain Descriptors / Indicators: Sore;Guarding;Grimacing Pain Intervention(s): Limited activity within patient's tolerance;Monitored during session;Repositioned     PT Goals (current goals can now be found in the care plan section) Acute Rehab PT Goals Patient Stated Goal: get home for Thanksgiving PT Goal Formulation: With patient Time For Goal Achievement: 08/04/21 Potential to Achieve Goals: Good Progress towards PT goals: Progressing toward goals    Frequency    Min 3X/week      PT Plan Discharge plan needs to be updated       AM-PAC PT "6 Clicks" Mobility   Outcome Measure  Help needed turning from your back to your side while in a flat bed without using bedrails?: A Little Help needed moving from lying on your back to sitting on the side of a flat bed without using bedrails?: A Little Help needed moving to and from a bed to a chair (including a wheelchair)?: A Little Help needed standing up from a chair using your arms (e.g., wheelchair or bedside chair)?: A Little Help needed to walk in hospital room?: A Little Help needed climbing 3-5 steps with a railing? : A Lot 6 Click Score: 17    End of Session Equipment Utilized During Treatment: Gait belt Activity Tolerance: Patient tolerated treatment well Patient  left: in bed;with call bell/phone within reach Nurse Communication: Mobility status PT Visit Diagnosis: Other abnormalities of gait and mobility (R26.89);Muscle weakness (generalized) (M62.81);Unsteadiness on feet (R26.81)     Time: 2951-8841 PT Time Calculation (min) (ACUTE ONLY): 28 min  Charges:  $Gait Training: 8-22 mins $Therapeutic Activity: 8-22 mins                     [place] Carbo, PT, DPT   Acute Rehabilitation Department Pager #: [phone]   Sandra Cockayne 07/24/2021, 4:51 PM

## 2021-07-24 NOTE — Progress Notes (Signed)
CARDIAC REHAB PHASE I   PRE:  Rate/Rhythm: 87 SR    BP: sitting 100/49    SaO2: 91 RA  MODE:  Ambulation: 340 ft   POST:  Rate/Rhythm: 100 ST    BP: sitting 102/81     SaO2: 96 RA  Pt weak and diaphoretic after HD. Willing to ambulate. Stood with rocking and used rollator at pts request. Fairly steady. Leans right due to previous left arm fracture x3. Had to rest x2 due to fatigue, lightheadedness. VSS, return to EOB with RN present. Pt follows sternal precautions with reminders.  [place], ACSM 07/24/2021 2:40 PM

## 2021-07-24 NOTE — Progress Notes (Addendum)
Patient ID: Billy [name], male   DOB: Jan 25, 1966, 55 y.o.   MRN: [id number]     Advanced Heart Failure Rounding Note  PCP-Cardiologist: None   Subjective:    10/25: S/P CABG x4 with MAZE . Intra Op EF 20%.  10/27: Intubated. Hypotensive. Placed on Norepi + Vaso. Recurrent A fib with amio increased to 60 mg/hr.  10/29: Co-ox low, DBA added 11/1: Extubated. AF with RVR, amio gtt restarted 11/03: Dobutamine off 11/6: Started iHD  11/8: R IJ tunneled HD cath placed  1L in UOP yesterday. Nephrology planning HD again today.   Hgb 7.3>>8.0 post transfusion.   Continues w/ positional dizziness. No syncope. Denies dyspnea.   NSR on tele. Co-ox pending.     Objective:   Weight Range: 107.3 kg Body mass index is 30.37 kg/m.   Vital Signs:   Temp:  [97.7 F (36.5 C)-99 F (37.2 C)] 98 F (36.7 C) (11/10 0340) Pulse Rate:  [73-82] 77 (11/10 0340) Resp:  [13-26] 20 (11/10 0340) BP: (114-137)/(55-74) 134/64 (11/10 0340) SpO2:  [93 %-98 %] 95 % (11/10 0340) Weight:  [107.3 kg] 107.3 kg (11/10 0340) Last BM Date: 07/23/21  Weight change: Filed Weights   07/22/21 0723 07/23/21 0340 07/24/21 0340  Weight: 108.9 kg 105.7 kg 107.3 kg    Intake/Output:   Intake/Output Summary (Last 24 hours) at 07/24/2021 0711 Last data filed at 07/24/2021 0345 Gross per 24 hour  Intake 543 ml  Output 1000 ml  Net -457 ml      Physical Exam    General:  Well appearing. No respiratory difficulty HEENT: normal Neck: supple. JVD 9 cm. Carotids 2+ bilat; no bruits. No lymphadenopathy or thyromegaly appreciated. Cor: PMI nondisplaced. Regular rate & rhythm. No rubs, gallops or murmurs. Lungs: clear Abdomen: soft, nontender, nondistended. No hepatosplenomegaly. No bruits or masses. Good bowel sounds. Extremities: no cyanosis, clubbing, rash, edema Neuro: alert & oriented x 3, cranial nerves grossly intact. moves all 4 extremities w/o difficulty. Affect pleasant.    Telemetry   NSR  80s personally reviewed  Labs    CBC Recent Labs    07/23/21 0340 07/23/21 1447 07/24/21 0223  WBC 7.5  --  8.8  HGB 7.3* 9.0* 8.0*  HCT 22.5* 27.0* 24.0*  MCV 94.5  --  94.1  PLT 428*  --  789*   Basic Metabolic Panel Recent Labs    07/23/21 0340 07/24/21 0223  NA 137 136  K 3.7 3.8  CL 101 102  CO2 26 26  GLUCOSE 131* 138*  BUN 54* 66*  CREATININE 4.53* 4.79*  CALCIUM 8.0* 8.0*  MG 2.3 2.1  PHOS 5.0* 4.8*   Liver Function Tests Recent Labs    07/23/21 0340 07/24/21 0223  ALBUMIN 2.2* 2.3*   No results for input(s): LIPASE, AMYLASE in the last 72 hours. Cardiac Enzymes No results for input(s): CKTOTAL, CKMB, CKMBINDEX, TROPONINI in the last 72 hours.  BNP: BNP (last 3 results) No results for input(s): BNP in the last 8760 hours.  ProBNP (last 3 results) No results for input(s): PROBNP in the last 8760 hours.   D-Dimer No results for input(s): DDIMER in the last 72 hours. Hemoglobin A1C No results for input(s): HGBA1C in the last 72 hours.  Fasting Lipid Panel No results for input(s): CHOL, HDL, LDLCALC, TRIG, CHOLHDL, LDLDIRECT in the last 72 hours.   Thyroid Function Tests No results for input(s): TSH, T4TOTAL, T3FREE, THYROIDAB in the last 72 hours.  Invalid input(s): FREET3  Other results:   Imaging    No results found.   Medications:     Scheduled Medications:  ALPRAZolam  1 mg Oral TID   amiodarone  200 mg Oral Daily   apixaban  5 mg Oral BID   atorvastatin  80 mg Oral Daily   calcium acetate  667 mg Oral TID WC   carvedilol  3.125 mg Oral BID WC   chlorhexidine  15 mL Mouth Rinse BID   Chlorhexidine Gluconate Cloth  6 each Topical Daily   Chlorhexidine Gluconate Cloth  6 each Topical Q0600   clopidogrel  75 mg Oral Daily   [facility], Patient & Family Education   Does not apply Once   [START ON 07/28/2021] darbepoetin (ARANESP) injection - NON-DIALYSIS  100 mcg Subcutaneous Q Mon-1800   feeding  supplement (NEPRO CARB STEADY)  237 mL Oral BID BM   hydrALAZINE  12.5 mg Oral TID   insulin aspart  3-9 Units Subcutaneous TID WC & HS   insulin detemir  20 Units Subcutaneous BID   isosorbide dinitrate  10 mg Oral TID   mouth rinse  15 mL Mouth Rinse q12n4p   multivitamin  1 tablet Oral QHS   pantoprazole  40 mg Oral Daily   sertraline  50 mg Oral Daily   sodium chloride flush  3 mL Intravenous Q12H   traZODone  100 mg Oral QHS    Infusions:  promethazine (PHENERGAN) injection (IM or IVPB) Stopped (07/09/21 1628)    PRN Medications: acetaminophen, albuterol, docusate, lip balm, metoprolol tartrate, ondansetron (ZOFRAN) IV, oxyCODONE, polyethylene glycol, promethazine (PHENERGAN) injection (IM or IVPB), sennosides, simethicone    Patient Profile   Billy [name] is a 46 year with a history of visually impaired, DM, HTN, depression, anxiety takes 2 mg xanax 3 times a day, SVT, CKD Stage II, PAF, and CAD.Marland Kitchen   Anterior Stemi--> CABG--> cardiogenic shock.   Assessment/Plan   1. CAD: Admitted with anterolateral MI from occluded diagonal treated with DES.  Patient then had CABG with LIMA-LAD, SVG-D, SVG-PDA, and left radial-OM for complete revascularization on 10/25   - Now off ASA given need for Plavix and anticoagulation.  - Continue Plavix 75 daily x 6 months post-PCI.  - atorvastatin 80 mg daily.  - Amlodipine for radial harvest held with hypotension on pressors.    - No chest pain 2. Atrial fibrillation: Paroxysmal, s/p Maze and appendage clipping.  Having brief runs of atrial fibrillation but not sustained. Nothing recent. Maintaining NSR - Continue PO amio 200 mg daily  .  - Eliquis 5 mg bid  3. AKI on CKD stage 3: Baseline creatinine around 1.6, up to 5 post-op.  He has likely had multiple hits including contrast with initial cath/PCI and possible peri-operative hypotension.  Suspect ATN. Concerned about chances of renal recovery. Stopped  CVVHD 11/4 -11/6 started iHD, 11/08 R  IJ tunneled HD catheter placed. Next dialysis session planned 11/10. - Making some urine, 1000 cc yesterday. UOP encouraging for possible eventual renal recovery 4. Acute systolic CHF: Ischemic cardiomyopathy.  Echo prior to this admission with EF 50-55% (pre-MI and CABG).  Limited echo on 10/28 showed minimal pericardial effusion, EF in 25% range. Intraoperative TEE with EF 20%. Off vasopressin + NE + Off DBA. Co-ox low yesterday at 38%. ? Anemia contributing. Will recheck post transfusion=>co-ox ordered   - Continue hydralazine 12.5 mg tid + isordil 10 tid. Will not titrate further SBP 100-110 today. - Continue  coreg 3.125  mg BID - No ARN/SGLT2 with AKI - Tolerated iHD  5. Anemia: Had 1 unit pRBCs 11/02 + 1 unit 11/9. Hgb 7.3>>8.0 - Getting IV iron with HD and aranesp.  - No obvious source. Monitor closely  6. ID: Afebrile, off abx. WBC stable 8.8 - Course of cefepime completed 7. Acute hypoxemic respiratory failure: Suspect primarily pulmonary edema, intubated. Possible PNA with bibasilar lung opacities. Extubated, completed course of cefepime, volume status now looks good. O2 stable on room air.   8. Thrombocytopenia: Resolved.  9. Right sided weakness: Normal head CT 11/03 10. Urinary Retention  -Foley placed 11/7  -Consider flomax 0.4 mg at bed time?  Lyda Jester PA-C 07/24/2021 7:11 AM  Agree with the above PA note.   Co-ox yesterday was low but not accurate (was drawn peripherally).  D/c'd co-ox order.   Had HD today, no problems.   Remains in NSR on amiodarone and apixaban.    Continue to work with PT, plan on d/c to SNF.   Loralie Champagne 07/24/2021

## 2021-07-24 NOTE — Consult Note (Signed)
   [facility] CM Inpatient Consult   07/24/2021  LORN BUTCHER 12-26-1965 [id number]  [place] [facility] [[facility]] Patient: Billy [name] Medicare  Primary Care Provider:  Reynold Bowen, MD [facility]  Patient screened for less than 30 days readmission and length of stay hospitalization with noted extreme  high risk score for unplanned readmission risk . Patient with a recent transfer from ICU unit. Reviewed to assess for potential [facility] Management service needs for post hospital transition.  Review of patient's medical record reveals patient is being considered for transition of care needs.  Patient progression noted by PT/OT and Rehab referral. Reviewed HF TOC team notes.  Plan:  Continue to follow progress and disposition to assess for post hospital care management needs.    For questions contact:   Natividad Brood, RN BSN [facility] Liaison  [phone] business mobile phone Toll free office [phone]  Fax number: [phone] [email or web address]

## 2021-07-24 NOTE — Progress Notes (Signed)
[facility] Progress Note   Subjective:  He had 1 L UOP over 11/9 charted.  Last HD on 11/8 with 2 kg UF.  Feels good today and happy to hear about Cr trends.  Discussed may be able to hold HD soon. Left IJ catheter was removed yesterday  Review of systems:   denies shortness of breath - looking back breathing is overall much better Denies chest pain No n/v  Objective Vitals:   07/23/21 1637 07/23/21 1948 07/24/21 0019 07/24/21 0340  BP: 128/69 (!) 114/55 115/66 134/64  Pulse: 82 78 75 77  Resp: 17 (!) 23 19 20   Temp: 98.8 F (37.1 C) 99 F (37.2 C) 98.7 F (37.1 C) 98 F (36.7 C)  TempSrc: Oral Oral Oral Oral  SpO2: 96% 94% 98% 95%  Weight:    107.3 kg  Height:       Physical Exam    General adult male in bed in no acute distress HEENT normocephalic atraumatic extraocular movements intact sclera anicteric Neck supple trachea midline Lungs clear to auscultation bilaterally normal work of breathing at rest on room air Heart S1S2 no rub Abdomen soft nontender nondistended Extremities no edema  Psych normal mood and affect Neuro alert and oriented x 3 provides hx and follows commands GU no foley  Access: RIJ tunneled catheter    Additional Objective Labs: Basic Metabolic Panel: Recent Labs  Lab 07/22/21 0054 07/23/21 0340 07/24/21 0223  NA 132* 137 136  K 4.4 3.7 3.8  CL 98 101 102  CO2 22 26 26   GLUCOSE 140* 131* 138*  BUN 80* 54* 66*  CREATININE 6.50* 4.53* 4.79*  CALCIUM 8.0* 8.0* 8.0*  PHOS 7.7* 5.0* 4.8*   Liver Function Tests: Recent Labs  Lab 07/22/21 0054 07/23/21 0340 07/24/21 0223  ALBUMIN 2.3* 2.2* 2.3*   CBC: Recent Labs  Lab 07/20/21 0209 07/21/21 0055 07/22/21 0054 07/23/21 0340 07/23/21 1447 07/24/21 0223  WBC 14.5* 11.6* 9.1 7.5  --  8.8  HGB 8.3* 7.7* 7.5* 7.3* 9.0* 8.0*  HCT 26.2* 23.9* 22.9* 22.5* 27.0* 24.0*  MCV 97.4 94.8 94.6 94.5  --  94.1  PLT 398 381 464* 428*  --  438*   Blood Culture     Component Value Date/Time   SDES TRACHEAL ASPIRATE 07/12/2021 0817   SPECREQUEST NONE 07/12/2021 0817   CULT  07/12/2021 0817    Normal respiratory flora-no Staph aureus or Pseudomonas seen Performed at [facility], [address] [place], [place] [address]    REPTSTATUS 07/14/2021 FINAL 07/12/2021 0817   CBG: Recent Labs  Lab 07/23/21 0815 07/23/21 1150 07/23/21 1640 07/23/21 1955 07/23/21 2108  GLUCAP 142* 146* 186* 218* 244*    Studies/Results: IR US Guide Vasc Access Right  Result Date: 07/22/2021 INDICATION: 62 year old with acute kidney injury. Request for a tunneled dialysis catheter. Patient currently has a non tunneled left jugular catheter. EXAM: FLUOROSCOPIC AND ULTRASOUND GUIDED PLACEMENT OF A TUNNELED DIALYSIS CATHETER Physician: Stephan Minister. Anselm Pancoast, MD MEDICATIONS: Ancef 2 g; The antibiotic was administered within an appropriate time interval prior to skin puncture. ANESTHESIA/SEDATION: Moderate (conscious) sedation was employed during this procedure. A total of Versed 2.0mg  and fentanyl 100 mcg was administered intravenously at the order of the provider performing the procedure. Total intra-service moderate sedation time: 25 minutes. Patient's level of consciousness and vital signs were monitored continuously by radiology nurse throughout the procedure under the supervision of the provider performing the procedure. FLUOROSCOPY TIME:  Fluoroscopy Time: 1  minute, 30 seconds, 13 mGy COMPLICATIONS: None immediate. PROCEDURE: Informed consent was obtained for placement of a tunneled dialysis catheter. The patient was placed supine on the interventional table. Ultrasound confirmed a patent right internal jugular vein. Ultrasound image obtained for documentation. The right neck and chest was prepped and draped in a sterile fashion. Maximal barrier sterile technique was utilized including caps, mask, sterile gowns, sterile gloves, sterile drape, hand hygiene and skin antiseptic. The  right neck was anesthetized with 1% lidocaine. A small incision was made with #11 blade scalpel. A 21 gauge needle directed into the right internal jugular vein with ultrasound guidance. A micropuncture dilator set was placed. A 23 cm tip to cuff Palindrome catheter was selected. The skin below the right clavicle was anesthetized and a small incision was made with an #11 blade scalpel. A subcutaneous tunnel was formed to the vein dermatotomy site. The catheter was brought through the tunnel. The vein dermatotomy site was dilated to accommodate a peel-away sheath. The catheter was placed through the peel-away sheath and directed into the central venous structures. The tip of the catheter was placed at superior cavoatrial junction with fluoroscopy. Fluoroscopic images were obtained for documentation. Both lumens were found to aspirate and flush well. The proper amount of heparin was flushed in both lumens. The vein dermatotomy site was closed using a single layer of absorbable suture and Dermabond. The catheter was secured to the skin using Prolene suture. IMPRESSION: Successful placement of a right jugular tunneled dialysis catheter using ultrasound and fluoroscopic guidance. Electronically Signed   By: Markus Daft M.D.   On: 07/22/2021 14:14   Medications:  promethazine (PHENERGAN) injection (IM or IVPB) Stopped (07/09/21 1628)    ALPRAZolam  1 mg Oral TID   amiodarone  200 mg Oral Daily   apixaban  5 mg Oral BID   atorvastatin  80 mg Oral Daily   calcium acetate  667 mg Oral TID WC   carvedilol  3.125 mg Oral BID WC   chlorhexidine  15 mL Mouth Rinse BID   Chlorhexidine Gluconate Cloth  6 each Topical Daily   Chlorhexidine Gluconate Cloth  6 each Topical Q0600   clopidogrel  75 mg Oral Daily   [facility], Patient & Family Education   Does not apply Once   [START ON 07/28/2021] darbepoetin (ARANESP) injection - NON-DIALYSIS  100 mcg Subcutaneous Q Mon-1800   feeding supplement (NEPRO  CARB STEADY)  237 mL Oral BID BM   hydrALAZINE  12.5 mg Oral TID   insulin aspart  3-9 Units Subcutaneous TID WC & HS   insulin detemir  20 Units Subcutaneous BID   isosorbide dinitrate  10 mg Oral TID   mouth rinse  15 mL Mouth Rinse q12n4p   multivitamin  1 tablet Oral QHS   pantoprazole  40 mg Oral Daily   sertraline  50 mg Oral Daily   sodium chloride flush  3 mL Intravenous Q12H   traZODone  100 mg Oral QHS     Assessment/Plan: 55yo M DM, HTN, A fib, CKD currently being followed for severe AKI s/p CABG x 4 and MAZE.   # Severe AKI on CKD 3b:   - Baseline 1.6 (Followed by Dr. Moshe Cipro outpt), was 1.9 prior to CABG  - AKI in setting of hemodynamic instability post op likely ATN but cholesterol embolic injury possible.  Initiated CRRT 10/27 for UF with ~anuria and volume overload.  Off of CRRT on 11/4 then iHD on 11/6.  Right IJ tunneled HD catheter on 11/8 with IR ---------------------- - HD today.  Assess needs daily and hope for improvement over time with his improved urine output and his Cr trends.  May be able to monitor after today's HD session - Continue Foley catheter   # Anemia: iron deficiency. S/p Ferrelecit .  Transfuse as needed - rec'd PRBC's per CHF on 11/9.  aranesp 40 mcg given on 11/7 and increased next dose to 100 mcg for 11/14  # Urinary retention - Foley catheter in place for retention on two bladder scans  # Hyponatremia: Has improved with volume removal/RRT  # CKD stage 3b - predates admission - Baseline Cr 1.6 and follows with Dr. Moshe Cipro outpatient  # CAD s/p CABG  # A fib: amiodarone, anticoagulation per primary  # DM: per primary  # Hyperphosphatemia - renal diet  - has been on HD - improved; on phoslo with meals for now  # Shock: resolved.  No longer requiring norepinephrine or dobutamine.   # Acute hypoxic resp failure and concern for possible PNA : optimize volume with HD. Completed course of cefepime per charting   Claudia Desanctis, MD 07/24/2021 6:30 AM

## 2021-07-25 LAB — RENAL FUNCTION PANEL
Albumin: 2.3 g/dL — ABNORMAL LOW (ref 3.5–5.0)
Anion gap: 10 (ref 5–15)
BUN: 36 mg/dL — ABNORMAL HIGH (ref 6–20)
CO2: 26 mmol/L (ref 22–32)
Calcium: 7.8 mg/dL — ABNORMAL LOW (ref 8.9–10.3)
Chloride: 99 mmol/L (ref 98–111)
Creatinine, Ser: 3.39 mg/dL — ABNORMAL HIGH (ref 0.61–1.24)
GFR, Estimated: 20 mL/min — ABNORMAL LOW (ref >60)
Glucose, Bld: 124 mg/dL — ABNORMAL HIGH (ref 70–99)
Phosphorus: 4.3 mg/dL (ref 2.5–4.6)
Potassium: 3.9 mmol/L (ref 3.5–5.1)
Sodium: 135 mmol/L (ref 135–145)

## 2021-07-25 LAB — GLUCOSE, CAPILLARY
Glucose-Capillary: 94 mg/dL (ref 70–99)
Glucose-Capillary: 206 mg/dL — ABNORMAL HIGH (ref 70–99)
Glucose-Capillary: 243 mg/dL — ABNORMAL HIGH (ref 70–99)
Glucose-Capillary: 169 mg/dL — ABNORMAL HIGH (ref 70–99)
Glucose-Capillary: 217 mg/dL — ABNORMAL HIGH (ref 70–99)
Glucose-Capillary: 207 mg/dL — ABNORMAL HIGH (ref 70–99)

## 2021-07-25 LAB — CBC
HCT: 23.3 % — ABNORMAL LOW (ref 39.0–52.0)
Hemoglobin: 7.7 g/dL — ABNORMAL LOW (ref 13.0–17.0)
MCH: 31.4 pg (ref 26.0–34.0)
MCHC: 33 g/dL (ref 30.0–36.0)
MCV: 95.1 fL (ref 80.0–100.0)
Platelets: 445 10*3/uL — ABNORMAL HIGH (ref 150–400)
RBC: 2.45 MIL/uL — ABNORMAL LOW (ref 4.22–5.81)
RDW: 14.1 % (ref 11.5–15.5)
WBC: 8.1 10*3/uL (ref 4.0–10.5)
nRBC: 0 % (ref 0.0–0.2)

## 2021-07-25 LAB — MAGNESIUM: Magnesium: 1.9 mg/dL (ref 1.7–2.4)

## 2021-07-25 MED ORDER — TAMSULOSIN HCL 0.4 MG PO CAPS: 0.4000 mg | ORAL_CAPSULE | Freq: Every day | ORAL | Status: DC

## 2021-07-25 MED ORDER — TAMSULOSIN HCL 0.4 MG PO CAPS
0.4000 mg | ORAL_CAPSULE | Freq: Every day | ORAL | Status: DC
  Administered 2021-07-25: 0.4 mg via ORAL
  Filled 2021-07-25: qty 1

## 2021-07-25 MED ORDER — POTASSIUM CHLORIDE CRYS ER 20 MEQ PO TBCR
20.0000 meq | EXTENDED_RELEASE_TABLET | Freq: Once | ORAL | Status: AC
  Administered 2021-07-25: 20 meq via ORAL
  Filled 2021-07-25: qty 1

## 2021-07-25 MED ORDER — SODIUM CHLORIDE 0.9 % IV BOLUS
250.0000 mL | Freq: Once | INTRAVENOUS | Status: AC
  Administered 2021-07-25: 250 mL via INTRAVENOUS

## 2021-07-25 MED ORDER — CHLORHEXIDINE GLUCONATE CLOTH 2 % EX PADS
6.0000 | MEDICATED_PAD | Freq: Every day | CUTANEOUS | Status: DC
  Administered 2021-07-26 – 2021-08-01 (×7): 6 via TOPICAL

## 2021-07-25 MED ORDER — MAGNESIUM SULFATE 2 GM/50ML IV SOLN
2.0000 g | Freq: Once | INTRAVENOUS | Status: AC
  Administered 2021-07-25: 2 g via INTRAVENOUS
  Filled 2021-07-25: qty 50

## 2021-07-25 NOTE — Progress Notes (Signed)
Occupational Therapy Treatment Patient Details Name: Billy [name] MRN: [id number] DOB: 1966-02-28 Today's Date: 07/25/2021   History of present illness Pt is a 55 y.o. male admitted 07/07/21 with STEMI. S/p LHC with PCA. S/p CABGx4 with MAZE 10/25. Extubated post-op on 10/26; reintubated 10/27-11/1. On CRRT 10/27-11/4. iHD initiated 11/6. S/p R IJ tunneled HD cath placement 11/8. PMH includes visual impairment, afib, HTN, DM, R toe amputation, neuropathy, anxiety.   OT comments  Pt educated in energy conservation, verbalized understanding. Dressed with set up, groomed with min guard assist. Ambulating with RW and min guard assist. Continues to demonstrate impaired safety awareness and remains a fall risk.    Recommendations for follow up therapy are one component of a multi-disciplinary discharge planning process, led by the attending physician.  Recommendations may be updated based on patient status, additional functional criteria and insurance authorization.    Follow Up Recommendations  Skilled nursing-short term rehab (<3 hours/day)    Assistance Recommended at Discharge Frequent or constant Supervision/Assistance  Equipment Recommendations  None recommended by OT    Recommendations for Other Services      Precautions / Restrictions Precautions Precautions: Fall;Other (comment);Sternal Precaution Comments: visually impaired       Mobility Bed Mobility Overal bed mobility: Needs Assistance Bed Mobility: Rolling;Sidelying to Sit;Sit to Supine Rolling: Modified independent (Device/Increase time) Sidelying to sit: Supervision   Sit to supine: Modified independent (Device/Increase time)        Transfers Overall transfer level: Needs assistance Equipment used: Rolling walker (2 wheels) Transfers: Sit to/from Stand Sit to Stand: Supervision           General transfer comment: self cued hands to knees     Balance Overall balance assessment: Needs  assistance Sitting-balance support: Feet supported;No upper extremity supported Sitting balance-Leahy Scale: Good       Standing balance-Leahy Scale: Poor Standing balance comment: on LOB at sink, caught himself                           ADL either performed or assessed with clinical judgement   ADL Overall ADL's : Needs assistance/impaired Eating/Feeding: Independent;Bed level   Grooming: Oral care;Standing;Min guard           Upper Body Dressing : Set up;Sitting   Lower Body Dressing: Set up;Sitting/lateral leans Lower Body Dressing Details (indicate cue type and reason): socks Toilet Transfer: Ambulation;Comfort height toilet;Rolling walker (2 wheels);Min guard   Toileting- Clothing Manipulation and Hygiene: Min guard;Sit to/from stand       Functional mobility during ADLs: Min guard;Rolling walker (2 wheels)      Extremity/Trunk Assessment              Vision       Perception     Praxis      Cognition Arousal/Alertness: Awake/alert Behavior During Therapy: WFL for tasks assessed/performed Overall Cognitive Status: Impaired/Different from baseline Area of Impairment: Memory;Safety/judgement                     Memory: Decreased short-term memory   Safety/Judgement: Decreased awareness of safety;Decreased awareness of deficits     General Comments: pt with decreased awareness of lines and balance deficits          Exercises     Shoulder Instructions       General Comments      Pertinent Vitals/ Pain       Pain Assessment: Faces Faces Pain Scale:  Hurts a little bit Pain Location: chest incision with bed mobility Pain Descriptors / Indicators: Sore Pain Intervention(s): Monitored during session  Home Living                                          Prior Functioning/Environment              Frequency  Min 2X/week        Progress Toward Goals  OT Goals(current goals can now be found  in the care plan section)  Progress towards OT goals: Progressing toward goals  Acute Rehab OT Goals OT Goal Formulation: With patient Time For Goal Achievement: 07/30/21 Potential to Achieve Goals: Good  Plan Discharge plan needs to be updated    Co-evaluation                 AM-PAC OT "6 Clicks" Daily Activity     Outcome Measure   Help from another person eating meals?: None Help from another person taking care of personal grooming?: A Little Help from another person toileting, which includes using toliet, bedpan, or urinal?: A Little Help from another person bathing (including washing, rinsing, drying)?: A Little Help from another person to put on and taking off regular upper body clothing?: None Help from another person to put on and taking off regular lower body clothing?: A Little 6 Click Score: 20    End of Session    OT Visit Diagnosis: Muscle weakness (generalized) (M62.81);Other symptoms and signs involving cognitive function;Pain   Activity Tolerance Patient tolerated treatment well   Patient Left in bed;with call bell/phone within reach;with bed alarm set   Nurse Communication          Time: 1345-1425 OT Time Calculation (min): 40 min  Charges: OT General Charges $OT Visit: 1 Visit OT Treatments $Self Care/Home Management : 38-52 mins Billy [name], OTR/L Acute Rehabilitation Services Pager: [phone] Office: [phone]  Billy [name] 07/25/2021, 2:29 PM

## 2021-07-25 NOTE — Progress Notes (Signed)
[address] HeightsSuite 411       [place] [address]             [phone]      17 Days Post-Op Procedure(s) (LRB): CORONARY ARTERY BYPASS GRAFTING (CABG) TIMES FOUR USING LEFT INTERNAL MAMMARY ARTERY, GREATER SAPHENOUS VEIN HARVESTED ENDOSCOPICALLY, AND LEFT RADIAL ARTERY HARVESTED OPEN (N/A) TRANSESOPHAGEAL ECHOCARDIOGRAM (TEE) (N/A) RADIAL ARTERY HARVEST (Left) ENDOVEIN HARVEST OF GREATER SAPHENOUS VEIN (Right) MAZE Subjective: Feels well  Objective: Vital signs in last 24 hours: Temp:  [98.2 F (36.8 C)-99.1 F (37.3 C)] 98.3 F (36.8 C) (11/11 0500) Pulse Rate:  [70-85] 75 (11/11 0500) Cardiac Rhythm: Normal sinus rhythm (11/11 0500) Resp:  [10-30] 19 (11/11 0500) BP: (100-142)/(49-84) 115/55 (11/11 0500) SpO2:  [93 %-98 %] 96 % (11/11 0500) Weight:  [105.7 kg-109.5 kg] 105.7 kg (11/11 0500)  Hemodynamic parameters for last 24 hours:    Intake/Output from previous day: 11/10 0701 - 11/11 0700 In: 493 [P.O.:480; I.V.:13] Out: 2388 [Urine:1400] Intake/Output this shift: No intake/output data recorded.  General appearance: alert, cooperative, and no distress Heart: regular rate and rhythm Lungs: clear to auscultation bilaterally Abdomen: benign Extremities: trace ankle edema Wound: all healing well  Lab Results: Recent Labs    07/24/21 0223 07/25/21 0115  WBC 8.8 8.1  HGB 8.0* 7.7*  HCT 24.0* 23.3*  PLT 438* 445*   BMET:  Recent Labs    07/24/21 0223 07/25/21 0115  NA 136 135  K 3.8 3.9  CL 102 99  CO2 26 26  GLUCOSE 138* 124*  BUN 66* 36*  CREATININE 4.79* 3.39*  CALCIUM 8.0* 7.8*    PT/INR: No results for input(s): LABPROT, INR in the last 72 hours. ABG    Component Value Date/Time   PHART 7.459 (H) 07/16/2021 1330   HCO3 24.8 07/16/2021 1330   TCO2 26 07/16/2021 1330   ACIDBASEDEF 9.0 (H) 07/10/2021 0303   O2SAT 38.3 07/23/2021 1447   CBG (last 3)  Recent Labs    07/24/21 1625 07/24/21 2232 07/25/21 0623  GLUCAP 203*  196* 94    Meds Scheduled Meds:  ALPRAZolam  1 mg Oral TID   amiodarone  200 mg Oral Daily   apixaban  5 mg Oral BID   atorvastatin  80 mg Oral Daily   calcium acetate  667 mg Oral TID WC   carvedilol  3.125 mg Oral BID WC   chlorhexidine  15 mL Mouth Rinse BID   Chlorhexidine Gluconate Cloth  6 each Topical Q0600   clopidogrel  75 mg Oral Daily   [facility], Patient & Family Education   Does not apply Once   [START ON 07/28/2021] darbepoetin (ARANESP) injection - NON-DIALYSIS  100 mcg Subcutaneous Q Mon-1800   feeding supplement (NEPRO CARB STEADY)  237 mL Oral BID BM   hydrALAZINE  12.5 mg Oral TID   insulin aspart  3-9 Units Subcutaneous TID WC & HS   insulin detemir  20 Units Subcutaneous BID   isosorbide dinitrate  10 mg Oral TID   mouth rinse  15 mL Mouth Rinse q12n4p   multivitamin  1 tablet Oral QHS   pantoprazole  40 mg Oral Daily   sertraline  50 mg Oral Daily   sodium chloride flush  3 mL Intravenous Q12H   tamsulosin  0.4 mg Oral QHS   traZODone  100 mg Oral QHS   Continuous Infusions:  promethazine (PHENERGAN) injection (IM or IVPB) Stopped (07/09/21 1628)  PRN Meds:.acetaminophen, albuterol, docusate, lip balm, metoprolol tartrate, ondansetron (ZOFRAN) IV, oxyCODONE, polyethylene glycol, promethazine (PHENERGAN) injection (IM or IVPB), sennosides, simethicone  Xrays No results found.  Assessment/Plan: S/P Procedure(s) (LRB): CORONARY ARTERY BYPASS GRAFTING (CABG) TIMES FOUR USING LEFT INTERNAL MAMMARY ARTERY, GREATER SAPHENOUS VEIN HARVESTED ENDOSCOPICALLY, AND LEFT RADIAL ARTERY HARVESTED OPEN (N/A) TRANSESOPHAGEAL ECHOCARDIOGRAM (TEE) (N/A) RADIAL ARTERY HARVEST (Left) ENDOVEIN HARVEST OF GREATER SAPHENOUS VEIN (Right) MAZE  1 Tmax 99, VSS s BP 100-140 range, one episode of dizziness after dialysis. Sinus rhythm, conts AHF titration of cardiology meds as outlined by them 2 nephrology conts to manage HD/renal replacement management,  starting flomax nightly, foley in place , plan for HD tomorrow and monitor UOP/creat trends 3 sats good on RA, not SOB 4 H/H-  7.7/23 .3, cont IRON/aranesp therapies 5 conts rehab/therapies 6 SNF search per SW, hopefully ready for D?C early next week    LOS: 18 days    Val Giovanni PA-C Pager [phone] 07/25/2021

## 2021-07-25 NOTE — Progress Notes (Signed)
Mobility Specialist Progress Note    07/25/21 1543  Mobility  Activity Ambulated in hall  Level of Assistance Modified independent, requires aide device or extra time  Assistive Device Four wheel walker  Distance Ambulated (ft) 415 ft  Mobility Ambulated with assistance in hallway  Mobility Response Tolerated fair  Mobility performed by Mobility specialist  $Mobility charge 1 Mobility   Pre-Mobility: 93 HR, 103/55 BP Post-Mobility: 90 HR, 123/69 BP  Pt received in bed and agreeable. C/o some lightheadedness/dizziness but not enough to inhibit mobility. Pt took one standing break and x2 seated breaks all because he wanted to check BP. Returned to bed with call bell in reach.   First standing break: 93/60 First seated break: 98/58 Second seated break: 109/66  Hildred Alamin Mobility Specialist  Mobility Specialist Phone: [phone]

## 2021-07-25 NOTE — Plan of Care (Signed)

## 2021-07-25 NOTE — Progress Notes (Addendum)
Patient ID: Billy [name], male   DOB: [birth year], 55 y.o.   MRN: [id number]     Advanced Heart Failure Rounding Note  PCP-Cardiologist: None   Subjective:    10/25: S/P CABG x4 with MAZE . Intra Op EF 20%.  10/27: Intubated. Hypotensive. Placed on Norepi + Vaso. Recurrent A fib with amio increased to 60 mg/hr.  10/29: Co-ox low, DBA added 11/1: Extubated. AF with RVR, amio gtt restarted 11/03: Dobutamine off 11/6: Started iHD  11/8: R IJ tunneled HD cath placed  1.4L UOP yesterday.   Last HD session 11/10  Received 1 U PRCs 11/09 for Hgb 7.3, Hgb trend 9.0 > 8.0 > 7.7  Scr continues to trend down 6.5 > 4.8 . 3.3  Did have some dizziness after HD session yesterday. Reports BP dropped.  Feels great when up ambulating halls. Eager to exercise.   Objective:   Weight Range: 105.7 kg Body mass index is 29.92 kg/m.   Vital Signs:   Temp:  [98.2 F (36.8 C)-99.1 F (37.3 C)] 98.5 F (36.9 C) (11/11 0739) Pulse Rate:  [70-85] 80 (11/11 0739) Resp:  [10-30] 23 (11/11 0739) BP: (100-140)/(49-83) 120/81 (11/11 0739) SpO2:  [93 %-98 %] 93 % (11/11 0739) Weight:  [105.7 kg-108.6 kg] 105.7 kg (11/11 0500) Last BM Date: 07/24/21  Weight change: Filed Weights   07/24/21 0740 07/24/21 1134 07/25/21 0500  Weight: 109.5 kg 108.6 kg 105.7 kg    Intake/Output:   Intake/Output Summary (Last 24 hours) at 07/25/2021 0900 Last data filed at 07/25/2021 7510 Gross per 24 hour  Intake 493 ml  Output 2388 ml  Net -1895 ml      Physical Exam   General:  Well appearing middle aged male. Lying in bed HEENT: normal Neck: supple. no JVD. Carotids 2+ bilat; no bruits. RIJ tunneled catheter Cor: PMI nondisplaced. Regular rate & rhythm. No rubs, gallops or murmurs. Sternum healing well. Lungs: clear Abdomen: soft, nontender, nondistended. No hepatosplenomegaly. No bruits or masses. Good bowel sounds. Extremities: no cyanosis, clubbing, rash, edema Neuro: alert & orientedx3,  cranial nerves grossly intact. moves all 4 extremities w/o difficulty. Affect pleasant     Telemetry   NSR 70s-80s  Labs    CBC Recent Labs    07/24/21 0223 07/25/21 0115  WBC 8.8 8.1  HGB 8.0* 7.7*  HCT 24.0* 23.3*  MCV 94.1 95.1  PLT 438* 258*   Basic Metabolic Panel Recent Labs    07/24/21 0223 07/25/21 0115  NA 136 135  K 3.8 3.9  CL 102 99  CO2 26 26  GLUCOSE 138* 124*  BUN 66* 36*  CREATININE 4.79* 3.39*  CALCIUM 8.0* 7.8*  MG 2.1 1.9  PHOS 4.8* 4.3   Liver Function Tests Recent Labs    07/24/21 0223 07/25/21 0115  ALBUMIN 2.3* 2.3*   No results for input(s): LIPASE, AMYLASE in the last 72 hours. Cardiac Enzymes No results for input(s): CKTOTAL, CKMB, CKMBINDEX, TROPONINI in the last 72 hours.  BNP: BNP (last 3 results) No results for input(s): BNP in the last 8760 hours.  ProBNP (last 3 results) No results for input(s): PROBNP in the last 8760 hours.   D-Dimer No results for input(s): DDIMER in the last 72 hours. Hemoglobin A1C No results for input(s): HGBA1C in the last 72 hours.  Fasting Lipid Panel No results for input(s): CHOL, HDL, LDLCALC, TRIG, CHOLHDL, LDLDIRECT in the last 72 hours.   Thyroid Function Tests No results for input(s): TSH, T4TOTAL, T3FREE,  THYROIDAB in the last 72 hours.  Invalid input(s): FREET3  Other results:   Imaging    No results found.   Medications:     Scheduled Medications:  ALPRAZolam  1 mg Oral TID   amiodarone  200 mg Oral Daily   apixaban  5 mg Oral BID   atorvastatin  80 mg Oral Daily   calcium acetate  667 mg Oral TID WC   carvedilol  3.125 mg Oral BID WC   chlorhexidine  15 mL Mouth Rinse BID   Chlorhexidine Gluconate Cloth  6 each Topical Q0600   clopidogrel  75 mg Oral Daily   [facility], Patient & Family Education   Does not apply Once   [START ON 07/28/2021] darbepoetin (ARANESP) injection - NON-DIALYSIS  100 mcg Subcutaneous Q Mon-1800   feeding  supplement (NEPRO CARB STEADY)  237 mL Oral BID BM   hydrALAZINE  12.5 mg Oral TID   insulin aspart  3-9 Units Subcutaneous TID WC & HS   insulin detemir  20 Units Subcutaneous BID   isosorbide dinitrate  10 mg Oral TID   mouth rinse  15 mL Mouth Rinse q12n4p   multivitamin  1 tablet Oral QHS   pantoprazole  40 mg Oral Daily   sertraline  50 mg Oral Daily   sodium chloride flush  3 mL Intravenous Q12H   tamsulosin  0.4 mg Oral QHS   traZODone  100 mg Oral QHS    Infusions:  promethazine (PHENERGAN) injection (IM or IVPB) Stopped (07/09/21 1628)    PRN Medications: acetaminophen, albuterol, docusate, lip balm, metoprolol tartrate, ondansetron (ZOFRAN) IV, oxyCODONE, polyethylene glycol, promethazine (PHENERGAN) injection (IM or IVPB), sennosides, simethicone    Patient Profile   [name] is a 19 year with a history of visually impaired, DM, HTN, depression, anxiety takes 2 mg xanax 3 times a day, SVT, CKD Stage II, PAF, and CAD.Marland Kitchen   Anterior Stemi--> CABG--> cardiogenic shock.   Assessment/Plan   1. CAD: Admitted with anterolateral MI from occluded diagonal treated with DES.  Patient then had CABG with LIMA-LAD, SVG-D, SVG-PDA, and left radial-OM for complete revascularization on 10/25   - Now off ASA given need for Plavix and anticoagulation.  - Continue Plavix 75 daily x 6 months post-PCI.  - atorvastatin 80 mg daily.  - Amlodipine for radial harvest held with hypotension on pressors.    - No chest pain 2. Atrial fibrillation: Paroxysmal, s/p Maze and appendage clipping.  Having brief runs of atrial fibrillation but not sustained. Nothing recent. Maintaining NSR - Continue PO amio 200 mg daily  .  - Eliquis 5 mg bid  3. AKI on CKD stage 3: Baseline creatinine around 1.6, up to 5 post-op.  He has likely had multiple hits including contrast with initial cath/PCI and possible peri-operative hypotension.  Suspect ATN. Concerned about chances of renal recovery. Stopped  CVVHD  11/4 -11/6 started iHD, 11/08 R IJ tunneled HD catheter placed. Next dialysis session planned 11/12 per nephrology. Giving NS 250 mL today. - Making some urine, 1.4L yesterday. UOP encouraging for possible eventual renal recovery 4. Acute systolic CHF: Ischemic cardiomyopathy.  Echo prior to this admission with EF 50-55% (pre-MI and CABG).  Limited echo on 10/28 showed minimal pericardial effusion, EF in 25% range. Intraoperative TEE with EF 20%. Off vasopressin + NE + Off DBA.  - Continue hydralazine 12.5 mg tid + isordil 10 tid. Will not titrate further to allow adequate BP for renal perfusion -  Continue  coreg 3.125 mg BID - No ARN/SGLT2 with AKI - Tolerated iHD. Volume appears stable. Nephrology watching to see if responds to fluids. 5. Anemia: Had 1 unit pRBCs 11/02 + 1 unit 11/9. Hgb 7.3>>8.0>>7.7 - CBC this afternoon - Getting IV iron with HD and aranesp.  - No obvious source. Monitor closely  6. ID: Afebrile, off abx. WBC stable 8.8 - Course of cefepime completed 7. Acute hypoxemic respiratory failure: Suspect primarily pulmonary edema, intubated. Possible PNA with bibasilar lung opacities. Extubated, completed course of cefepime, volume status now looks good. O2 stable on room air.   8. Thrombocytopenia: Resolved.  9. Right sided weakness: Normal head CT 11/03 10. Urinary Retention  -Foley placed 11/7  -Starting flomax   SNF for rehab at D/C CIR denied   [facility], LINDSAY N PA-C 07/25/2021 9:00 AM  Patient seen with PA, agree with the above note.   Stable, working with PT.  Remains in NSR on amiodarone and Eliquis.  Nephrology following to determine future HD needs.   We will see again Monday unless called.   Loralie Champagne 07/25/2021 1:33 PM

## 2021-07-25 NOTE — TOC Progression Note (Addendum)
Transition of Care [facility]) - Progression Note    Patient Details  Name: Billy [name] MRN: [id number] Date of Birth: 1965-12-07  Transition of Care [facility]) CM/SW [place], [place] Phone Number: 07/25/2021, 4:13 PM  Clinical Narrative:    HF CSW reached out to [place] [phone] and they reported that the insurance authorization did get approved and came back. Per cardiology note it looks like Mr. Epperly will be here through the weekend. CSW looking into dialysis clinics for HD and reached out to the renal navigator T. Mounts who reported that she is already familiar with Mr. Hockey and will follow up on Monday.  CSW will continue to follow throughout discharge.   Expected Discharge Plan: [place] Barriers to Discharge: Continued Medical Work up  Expected Discharge Plan and Services Expected Discharge Plan: [place] In-house Referral: Clinical Social Work Discharge Planning Services: CM Consult Post Acute Care Choice: [place] Living arrangements for the past 2 months: Single Family Home                                       Social Determinants of Health (SDOH) Interventions Food Insecurity Interventions: Intervention Not Indicated Financial Strain Interventions: Intervention Not Indicated Housing Interventions: Intervention Not Indicated Transportation Interventions: Intervention Not Indicated, Other (Comment) (Pt is visually impaired but receives help from family with transportation needs.)  Readmission Risk Interventions No flowsheet data found.  Natilie Krabbenhoft, MSW, [place] Heart Failure Social Worker

## 2021-07-25 NOTE — Progress Notes (Signed)
CARDIAC REHAB PHASE I   PRE:  Rate/Rhythm: 81 SR    BP: sitting 120/75    SaO2: 93 RA  MODE:  Ambulation: 820 ft   POST:  Rate/Rhythm: 91 SR    BP: sitting 115/69     SaO2: 97 RA  Pt eager to ambulate. Stood from low surface on second attempt. Walked with rollator, steady, no assist needed. Only c/o is back tightness. He did walk 50 ft without holding to rollator but felt insecure/nervous due to sight limitations and cords. To recliner, encouraged more walking and IS. 1020-1100   [place], ACSM 07/25/2021 10:56 AM

## 2021-07-25 NOTE — Progress Notes (Signed)
[facility] Progress Note   Subjective:  He had 1.4 L UOP over 11/10 charted.  Last HD on 11/10 with 1 kg UF.  Excited about numbers.  Dizziness is better - he had one episode yesterday but states was after HD and his BP had dropped.   Review of systems:    denies shortness of breath - looking back breathing is overall much better Denies chest pain No n/v  Objective Vitals:   07/24/21 1800 07/24/21 2017 07/24/21 2234 07/25/21 0500  BP: 133/68 120/73 113/71 (!) 115/55  Pulse: 81 81 84 75  Resp: (!) 21 16 19 19   Temp:  98.5 F (36.9 C) 98.2 F (36.8 C) 98.3 F (36.8 C)  TempSrc:  Oral Oral Oral  SpO2: 98% 94% 93% 96%  Weight:    105.7 kg  Height:       Physical Exam    General adult male in bed in no acute distress HEENT normocephalic atraumatic extraocular movements intact sclera anicteric Neck supple trachea midline Lungs clear to auscultation bilaterally normal work of breathing at rest on room air Heart S1S2 no rub Abdomen soft nontender nondistended Extremities no edema  Psych normal mood and affect Neuro alert and oriented x 3 provides hx and follows commands GU no foley  Access: RIJ tunneled catheter    Additional Objective Labs: Basic Metabolic Panel: Recent Labs  Lab 07/23/21 0340 07/24/21 0223 07/25/21 0115  NA 137 136 135  K 3.7 3.8 3.9  CL 101 102 99  CO2 26 26 26   GLUCOSE 131* 138* 124*  BUN 54* 66* 36*  CREATININE 4.53* 4.79* 3.39*  CALCIUM 8.0* 8.0* 7.8*  PHOS 5.0* 4.8* 4.3   Liver Function Tests: Recent Labs  Lab 07/23/21 0340 07/24/21 0223 07/25/21 0115  ALBUMIN 2.2* 2.3* 2.3*   CBC: Recent Labs  Lab 07/21/21 0055 07/22/21 0054 07/23/21 0340 07/23/21 1447 07/24/21 0223 07/25/21 0115  WBC 11.6* 9.1 7.5  --  8.8 8.1  HGB 7.7* 7.5* 7.3* 9.0* 8.0* 7.7*  HCT 23.9* 22.9* 22.5* 27.0* 24.0* 23.3*  MCV 94.8 94.6 94.5  --  94.1 95.1  PLT 381 464* 428*  --  438* 445*   Blood Culture    Component Value Date/Time    SDES TRACHEAL ASPIRATE 07/12/2021 0817   SPECREQUEST NONE 07/12/2021 0817   CULT  07/12/2021 0817    Normal respiratory flora-no Staph aureus or Pseudomonas seen Performed at [facility], [address], [place], [place] [address]    REPTSTATUS 07/14/2021 FINAL 07/12/2021 0817   CBG: Recent Labs  Lab 07/24/21 0622 07/24/21 1120 07/24/21 1219 07/24/21 1625 07/24/21 2232  GLUCAP 119* 125* 136* 203* 196*    Studies/Results: No results found. Medications:  promethazine (PHENERGAN) injection (IM or IVPB) Stopped (07/09/21 1628)    ALPRAZolam  1 mg Oral TID   amiodarone  200 mg Oral Daily   apixaban  5 mg Oral BID   atorvastatin  80 mg Oral Daily   calcium acetate  667 mg Oral TID WC   carvedilol  3.125 mg Oral BID WC   chlorhexidine  15 mL Mouth Rinse BID   Chlorhexidine Gluconate Cloth  6 each Topical Q0600   clopidogrel  75 mg Oral Daily   [place] [facility], Patient & Family Education   Does not apply Once   [START ON 07/28/2021] darbepoetin (ARANESP) injection - NON-DIALYSIS  100 mcg Subcutaneous Q Mon-1800   feeding supplement (NEPRO CARB STEADY)  237 mL  Oral BID BM   hydrALAZINE  12.5 mg Oral TID   insulin aspart  3-9 Units Subcutaneous TID WC & HS   insulin detemir  20 Units Subcutaneous BID   isosorbide dinitrate  10 mg Oral TID   mouth rinse  15 mL Mouth Rinse q12n4p   multivitamin  1 tablet Oral QHS   pantoprazole  40 mg Oral Daily   sertraline  50 mg Oral Daily   sodium chloride flush  3 mL Intravenous Q12H   traZODone  100 mg Oral QHS     Assessment/Plan: 55yo M DM, HTN, A fib, CKD currently being followed for severe AKI s/p CABG x 4 and MAZE.   # Severe AKI on CKD 3b:   - Baseline 1.6 (Followed by Dr. Moshe Cipro outpt), was 1.9 prior to CABG  - AKI in setting of hemodynamic instability post op likely ATN but cholesterol embolic injury possible.  Initiated CRRT 10/27 for UF with ~anuria and volume overload.  Off of CRRT on 11/4 then iHD on  11/6.  Right IJ tunneled HD catheter on 11/8 with IR ---------------------- - Assess dialysis needs daily and hope for improvement over time with his improved urine output and his Cr trends - Tentative plan for possible HD on 11/12 and may be able to watch after this treatment.  May not need if responds to fluids today - NS 250 mL once now and ok to repeat if needed  - Continue Foley catheter   # Anemia: iron deficiency.  - S/p Ferrelecit .  Transfuse as needed - rec'd PRBC's per CHF on 11/9.  - aranesp 40 mcg given on 11/7 and increased next dose to 100 mcg for 11/14  # Urinary retention - Foley catheter in place for retention on two bladder scans - Start Flomax nightly   # Hyponatremia: Has improved with volume removal/RRT  # CKD stage 3b - predates admission - Baseline Cr 1.6 and follows with Dr. Moshe Cipro outpatient  # CAD s/p CABG  # A fib: amiodarone, anticoagulation per primary  # DM: per primary  # Hyperphosphatemia - renal diet  - has been on HD - improved; on phoslo with meals for now - may be able to stop soon  # Shock: resolved.  No longer requiring norepinephrine or dobutamine.   # Acute hypoxic resp failure and concern for possible PNA : optimize volume with HD. Completed course of cefepime per charting   Claudia Desanctis, MD 07/25/2021 6:22 AM

## 2021-07-26 LAB — GLUCOSE, CAPILLARY
Glucose-Capillary: 87 mg/dL (ref 70–99)
Glucose-Capillary: 158 mg/dL — ABNORMAL HIGH (ref 70–99)
Glucose-Capillary: 275 mg/dL — ABNORMAL HIGH (ref 70–99)
Glucose-Capillary: 137 mg/dL — ABNORMAL HIGH (ref 70–99)

## 2021-07-26 LAB — CBC
HCT: 24.9 % — ABNORMAL LOW (ref 39.0–52.0)
Hemoglobin: 8.1 g/dL — ABNORMAL LOW (ref 13.0–17.0)
MCH: 31.3 pg (ref 26.0–34.0)
MCHC: 32.5 g/dL (ref 30.0–36.0)
MCV: 96.1 fL (ref 80.0–100.0)
Platelets: 390 10*3/uL (ref 150–400)
RBC: 2.59 MIL/uL — ABNORMAL LOW (ref 4.22–5.81)
RDW: 14.3 % (ref 11.5–15.5)
WBC: 8.1 10*3/uL (ref 4.0–10.5)
nRBC: 0 % (ref 0.0–0.2)

## 2021-07-26 LAB — RENAL FUNCTION PANEL
Albumin: 2.3 g/dL — ABNORMAL LOW (ref 3.5–5.0)
Anion gap: 9 (ref 5–15)
BUN: 48 mg/dL — ABNORMAL HIGH (ref 6–20)
CO2: 25 mmol/L (ref 22–32)
Calcium: 7.9 mg/dL — ABNORMAL LOW (ref 8.9–10.3)
Chloride: 100 mmol/L (ref 98–111)
Creatinine, Ser: 4.01 mg/dL — ABNORMAL HIGH (ref 0.61–1.24)
GFR, Estimated: 17 mL/min — ABNORMAL LOW (ref >60)
Glucose, Bld: 131 mg/dL — ABNORMAL HIGH (ref 70–99)
Phosphorus: 4.2 mg/dL (ref 2.5–4.6)
Potassium: 3.8 mmol/L (ref 3.5–5.1)
Sodium: 134 mmol/L — ABNORMAL LOW (ref 135–145)

## 2021-07-26 LAB — MAGNESIUM: Magnesium: 2.1 mg/dL (ref 1.7–2.4)

## 2021-07-26 MED ORDER — TAMSULOSIN HCL 0.4 MG PO CAPS
0.4000 mg | ORAL_CAPSULE | Freq: Every day | ORAL | Status: DC
  Administered 2021-07-26 – 2021-08-05 (×11): 0.4 mg via ORAL
  Filled 2021-07-26 (×11): qty 1

## 2021-07-26 MED ORDER — HEPARIN SODIUM (PORCINE) 1000 UNIT/ML IJ SOLN
INTRAMUSCULAR | Status: AC
  Administered 2021-07-26: 1000 [IU]
  Filled 2021-07-26: qty 4

## 2021-07-26 NOTE — Progress Notes (Signed)
CARDIAC REHAB PHASE I   PRE:  Rate/Rhythm: 80 SR  BP:  Sitting: 106/69  Standing: 89/55     SaO2: 95 RA  Came to walk pt. Measured BP sitting and standing. Standing BP was low so I notified the nurse. Nurse stated that pt will be ambulated by mobility team later today. Pt in bed with call bell in reach. Will continue to follow.   [id number] Sheppard Plumber, MS, ACSM-CEP 07/26/2021 10:29 AM

## 2021-07-26 NOTE — Progress Notes (Addendum)
[address]       [place] [address]             [phone]      18 Days Post-Op Procedure(s) (LRB): CORONARY ARTERY BYPASS GRAFTING (CABG) TIMES FOUR USING LEFT INTERNAL MAMMARY ARTERY, GREATER SAPHENOUS VEIN HARVESTED ENDOSCOPICALLY, AND LEFT RADIAL ARTERY HARVESTED OPEN (N/A) TRANSESOPHAGEAL ECHOCARDIOGRAM (TEE) (N/A) RADIAL ARTERY HARVEST (Left) ENDOVEIN HARVEST OF GREATER SAPHENOUS VEIN (Right) MAZE Subjective: Some dizziness and lower BP with ambulation but overall conts to feel much better with time  Objective: Vital signs in last 24 hours: Temp:  [97.5 F (36.4 C)-98.7 F (37.1 C)] 97.8 F (36.6 C) (11/12 0734) Pulse Rate:  [75-85] 82 (11/12 0734) Cardiac Rhythm: Normal sinus rhythm (11/12 0704) Resp:  [15-20] 17 (11/12 0734) BP: (102-127)/(51-86) 115/86 (11/12 0734) SpO2:  [92 %-94 %] 94 % (11/12 0734) Weight:  [107.1 kg] 107.1 kg (11/12 0320)  Hemodynamic parameters for last 24 hours:    Intake/Output from previous day: 11/11 0701 - 11/12 0700 In: 1197 [P.O.:1194; I.V.:3] Out: 500 [Urine:500] Intake/Output this shift: No intake/output data recorded.  General appearance: alert, cooperative, and no distress Heart: regular rate and rhythm Lungs: dim left bases Abdomen: benign Extremities: no edema Wound: incis healing well  Lab Results: Recent Labs    07/25/21 0115 07/26/21 0057  WBC 8.1 8.1  HGB 7.7* 8.1*  HCT 23.3* 24.9*  PLT 445* 390   BMET:  Recent Labs    07/25/21 0115 07/26/21 0057  NA 135 134*  K 3.9 3.8  CL 99 100  CO2 26 25  GLUCOSE 124* 131*  BUN 36* 48*  CREATININE 3.39* 4.01*  CALCIUM 7.8* 7.9*    PT/INR: No results for input(s): LABPROT, INR in the last 72 hours. ABG    Component Value Date/Time   PHART 7.459 (H) 07/16/2021 1330   HCO3 24.8 07/16/2021 1330   TCO2 26 07/16/2021 1330   ACIDBASEDEF 9.0 (H) 07/10/2021 0303   O2SAT 38.3 07/23/2021 1447   CBG (last 3)  Recent Labs     07/25/21 1813 07/25/21 2117 07/26/21 0611  GLUCAP 217* 207* 87    Meds Scheduled Meds:  ALPRAZolam  1 mg Oral TID   amiodarone  200 mg Oral Daily   apixaban  5 mg Oral BID   atorvastatin  80 mg Oral Daily   calcium acetate  667 mg Oral TID WC   carvedilol  3.125 mg Oral BID WC   chlorhexidine  15 mL Mouth Rinse BID   Chlorhexidine Gluconate Cloth  6 each Topical Q0600   clopidogrel  75 mg Oral Daily   [START ON 07/28/2021] darbepoetin (ARANESP) injection - NON-DIALYSIS  100 mcg Subcutaneous Q Mon-1800   feeding supplement (NEPRO CARB STEADY)  237 mL Oral BID BM   hydrALAZINE  12.5 mg Oral TID   insulin aspart  3-9 Units Subcutaneous TID WC & HS   insulin detemir  20 Units Subcutaneous BID   isosorbide dinitrate  10 mg Oral TID   mouth rinse  15 mL Mouth Rinse q12n4p   multivitamin  1 tablet Oral QHS   pantoprazole  40 mg Oral Daily   sertraline  50 mg Oral Daily   sodium chloride flush  3 mL Intravenous Q12H   tamsulosin  0.4 mg Oral QHS   traZODone  100 mg Oral QHS   Continuous Infusions:  promethazine (PHENERGAN) injection (IM or IVPB) Stopped (07/09/21 1628)   PRN Meds:.acetaminophen, albuterol,  docusate, lip balm, metoprolol tartrate, ondansetron (ZOFRAN) IV, oxyCODONE, polyethylene glycol, promethazine (PHENERGAN) injection (IM or IVPB), sennosides, simethicone  Xrays No results found.  Assessment/Plan: S/P Procedure(s) (LRB): CORONARY ARTERY BYPASS GRAFTING (CABG) TIMES FOUR USING LEFT INTERNAL MAMMARY ARTERY, GREATER SAPHENOUS VEIN HARVESTED ENDOSCOPICALLY, AND LEFT RADIAL ARTERY HARVESTED OPEN (N/A) TRANSESOPHAGEAL ECHOCARDIOGRAM (TEE) (N/A) RADIAL ARTERY HARVEST (Left) ENDOVEIN HARVEST OF GREATER SAPHENOUS VEIN (Right) MAZE  1 afeb, VSS, sinus rhythm, some PVC's 2 sats good on RA 3 conts to make good urine- HD today 4 anemia stable 5 SNF search in place 6 renal navigator assisting with outpatient planning 7 Stable drom AHF surgical perspective 8  nephrology managing renal/HD issues 9 cont rehab therapies 10 overall progress is good      LOS: 19 days    Midas Giovanni PA-C Pager [phone] 07/26/2021    Chart reviewed, patient examined, agree with above. Plan HD today. Working on placement.

## 2021-07-26 NOTE — Progress Notes (Signed)
[facility] Progress Note   Subjective:  He had 500 mL UOP over 11/11 charted.  Last HD on 11/10 with 1 kg UF.  Feels good today.  He walked with PT and did get a little dizzy.  BP dropped at those times he states.  Feels better now.  Got flomax during the day after it appears order was modified.  Review of systems:   denies shortness of breath Denies chest pain No n/v  Objective Vitals:   07/25/21 1334 07/25/21 1622 07/25/21 2233 07/26/21 0320  BP: 115/72 (!) 124/51 102/64 109/67  Pulse: 85  80 75  Resp: 19 15 20 20   Temp: (!) 97.5 F (36.4 C)  98.7 F (37.1 C) 98.5 F (36.9 C)  TempSrc: Oral  Oral Oral  SpO2: 92%  93% 94%  Weight:    107.1 kg  Height:       Physical Exam    General adult male in bed in no acute distress HEENT normocephalic atraumatic extraocular movements intact sclera anicteric Neck supple trachea midline Lungs clear to auscultation bilaterally normal work of breathing at rest on room air Heart S1S2 no rub Abdomen soft nontender nondistended Extremities no edema  Psych normal mood and affect Neuro alert and oriented x 3 provides hx and follows commands GU no foley  Access: RIJ tunneled catheter    Additional Objective Labs: Basic Metabolic Panel: Recent Labs  Lab 07/24/21 0223 07/25/21 0115 07/26/21 0057  NA 136 135 134*  K 3.8 3.9 3.8  CL 102 99 100  CO2 26 26 25   GLUCOSE 138* 124* 131*  BUN 66* 36* 48*  CREATININE 4.79* 3.39* 4.01*  CALCIUM 8.0* 7.8* 7.9*  PHOS 4.8* 4.3 4.2   Liver Function Tests: Recent Labs  Lab 07/24/21 0223 07/25/21 0115 07/26/21 0057  ALBUMIN 2.3* 2.3* 2.3*   CBC: Recent Labs  Lab 07/22/21 0054 07/23/21 0340 07/23/21 1447 07/24/21 0223 07/25/21 0115 07/26/21 0057  WBC 9.1 7.5  --  8.8 8.1 8.1  HGB 7.5* 7.3*   < > 8.0* 7.7* 8.1*  HCT 22.9* 22.5*   < > 24.0* 23.3* 24.9*  MCV 94.6 94.5  --  94.1 95.1 96.1  PLT 464* 428*  --  438* 445* 390   < > = values in this interval not  displayed.   Blood Culture    Component Value Date/Time   SDES TRACHEAL ASPIRATE 07/12/2021 0817   SPECREQUEST NONE 07/12/2021 0817   CULT  07/12/2021 0817    Normal respiratory flora-no Staph aureus or Pseudomonas seen Performed at [facility] [address] [place], [place] [address]    REPTSTATUS 07/14/2021 FINAL 07/12/2021 0817   CBG: Recent Labs  Lab 07/25/21 1126 07/25/21 1329 07/25/21 1623 07/25/21 1813 07/25/21 2117  GLUCAP 206* 243* 169* 217* 207*    Studies/Results: No results found. Medications:  promethazine (PHENERGAN) injection (IM or IVPB) Stopped (07/09/21 1628)    ALPRAZolam  1 mg Oral TID   amiodarone  200 mg Oral Daily   apixaban  5 mg Oral BID   atorvastatin  80 mg Oral Daily   calcium acetate  667 mg Oral TID WC   carvedilol  3.125 mg Oral BID WC   chlorhexidine  15 mL Mouth Rinse BID   Chlorhexidine Gluconate Cloth  6 each Topical Q0600   clopidogrel  75 mg Oral Daily   [facility] Education   Does not apply Once   [START ON  07/28/2021] darbepoetin (ARANESP) injection - NON-DIALYSIS  100 mcg Subcutaneous Q Mon-1800   feeding supplement (NEPRO CARB STEADY)  237 mL Oral BID BM   hydrALAZINE  12.5 mg Oral TID   insulin aspart  3-9 Units Subcutaneous TID WC & HS   insulin detemir  20 Units Subcutaneous BID   isosorbide dinitrate  10 mg Oral TID   mouth rinse  15 mL Mouth Rinse q12n4p   multivitamin  1 tablet Oral QHS   pantoprazole  40 mg Oral Daily   sertraline  50 mg Oral Daily   sodium chloride flush  3 mL Intravenous Q12H   tamsulosin  0.4 mg Oral Daily   traZODone  100 mg Oral QHS     Assessment/Plan: 55yo M DM, HTN, A fib, CKD currently being followed for severe AKI s/p CABG x 4 and MAZE.   # Severe AKI on CKD 3b:   - Baseline 1.6 (Followed by Dr. Moshe Cipro outpt), was 1.9 prior to CABG  - AKI in setting of hemodynamic instability post op likely ATN but cholesterol embolic injury possible.   Initiated CRRT 10/27 for UF with ~anuria and volume overload.  Off of CRRT on 11/4 then iHD on 11/6.  Right IJ tunneled HD catheter on 11/8 with IR ---------------------- - Assess dialysis needs daily and hope for improvement over time with his improved urine output and his Cr trends - HD on 11/12 and may be able to watch off HD after this treatment - Continue Foley catheter   # Anemia: iron deficiency.  - S/p Ferrelecit .  Transfuse as needed - rec'd PRBC's per CHF on 11/9.  - aranesp 40 mcg given on 11/7 and increased next dose to 100 mcg for 11/14  # Urinary retention - Foley catheter in place for retention on two bladder scans - Started Flomax - retime to nightly again  - reassess foley removal tomorrow  # Hyponatremia: Has improved with volume removal/RRT  # CKD stage 3b - predates admission - Baseline Cr 1.6 and follows with Dr. Moshe Cipro outpatient  # CAD s/p CABG  # A fib: amiodarone, anticoagulation per primary  # DM: per primary  # Hyperphosphatemia - renal diet  - has been on HD - improved; on phoslo with meals for now - may be able to stop soon  # Shock: resolved.  No longer requiring norepinephrine or dobutamine.   # Acute hypoxic resp failure and concern for possible PNA : optimize volume with HD. Completed course of cefepime per charting   Claudia Desanctis, MD 07/26/2021 5:57 AM

## 2021-07-26 NOTE — Plan of Care (Signed)

## 2021-07-27 LAB — CBC
HCT: 23.7 % — ABNORMAL LOW (ref 39.0–52.0)
Hemoglobin: 7.7 g/dL — ABNORMAL LOW (ref 13.0–17.0)
MCH: 31.2 pg (ref 26.0–34.0)
MCHC: 32.5 g/dL (ref 30.0–36.0)
MCV: 96 fL (ref 80.0–100.0)
Platelets: 388 10*3/uL (ref 150–400)
RBC: 2.47 MIL/uL — ABNORMAL LOW (ref 4.22–5.81)
RDW: 14.3 % (ref 11.5–15.5)
WBC: 7.7 10*3/uL (ref 4.0–10.5)
nRBC: 0 % (ref 0.0–0.2)

## 2021-07-27 LAB — GLUCOSE, CAPILLARY
Glucose-Capillary: 111 mg/dL — ABNORMAL HIGH (ref 70–99)
Glucose-Capillary: 160 mg/dL — ABNORMAL HIGH (ref 70–99)
Glucose-Capillary: 169 mg/dL — ABNORMAL HIGH (ref 70–99)
Glucose-Capillary: 156 mg/dL — ABNORMAL HIGH (ref 70–99)

## 2021-07-27 LAB — RENAL FUNCTION PANEL
Albumin: 2.3 g/dL — ABNORMAL LOW (ref 3.5–5.0)
Anion gap: 9 (ref 5–15)
BUN: 25 mg/dL — ABNORMAL HIGH (ref 6–20)
CO2: 28 mmol/L (ref 22–32)
Calcium: 7.7 mg/dL — ABNORMAL LOW (ref 8.9–10.3)
Chloride: 97 mmol/L — ABNORMAL LOW (ref 98–111)
Creatinine, Ser: 2.54 mg/dL — ABNORMAL HIGH (ref 0.61–1.24)
GFR, Estimated: 29 mL/min — ABNORMAL LOW (ref >60)
Glucose, Bld: 151 mg/dL — ABNORMAL HIGH (ref 70–99)
Phosphorus: 3.2 mg/dL (ref 2.5–4.6)
Potassium: 3.6 mmol/L (ref 3.5–5.1)
Sodium: 134 mmol/L — ABNORMAL LOW (ref 135–145)

## 2021-07-27 LAB — MAGNESIUM: Magnesium: 2 mg/dL (ref 1.7–2.4)

## 2021-07-27 MED ORDER — INSULIN ASPART 100 UNIT/ML IJ SOLN
0.0000 [IU] | Freq: Three times a day (TID) | INTRAMUSCULAR | Status: DC
  Administered 2021-07-27 – 2021-07-28 (×4): 3 [IU] via SUBCUTANEOUS
  Administered 2021-07-29: 5 [IU] via SUBCUTANEOUS
  Administered 2021-07-29: 2 [IU] via SUBCUTANEOUS
  Administered 2021-07-30: 3 [IU] via SUBCUTANEOUS
  Administered 2021-07-30: 5 [IU] via SUBCUTANEOUS
  Administered 2021-07-31: 17:00:00 3 [IU] via SUBCUTANEOUS
  Administered 2021-07-31 – 2021-08-01 (×2): 5 [IU] via SUBCUTANEOUS
  Administered 2021-08-01: 3 [IU] via SUBCUTANEOUS
  Administered 2021-08-02: 8 [IU] via SUBCUTANEOUS
  Administered 2021-08-02: 2 [IU] via SUBCUTANEOUS
  Administered 2021-08-02 – 2021-08-04 (×4): 3 [IU] via SUBCUTANEOUS
  Administered 2021-08-04: 5 [IU] via SUBCUTANEOUS
  Administered 2021-08-05 (×2): 3 [IU] via SUBCUTANEOUS

## 2021-07-27 MED ORDER — SODIUM CHLORIDE 0.9 % IV BOLUS
250.0000 mL | Freq: Once | INTRAVENOUS | Status: AC
  Administered 2021-07-27: 250 mL via INTRAVENOUS

## 2021-07-27 NOTE — Progress Notes (Signed)
Pt ambulated in a hallway in the morning almost 200 ft without any episodes of chest pain but felt some lightheaded, later in afternoon we tried to walk again but he could not ambulate more than 35ft and said he is dizzy, BP was 114/63, Besides pt able to void after foley removal. No any other specific complaints. Will continue to monitor the patient  Palma Holter, RN

## 2021-07-27 NOTE — Progress Notes (Addendum)
[facility] Progress Note   Subjective:  He had 1.5 L UOP over 11/12 charted.  Last HD on 11/12 with no post-HD assessment to indicate if any UF.  Got flomax last night. (2nd dose). He feels ok.  Some lightheadedness which he associates with low blood pressure .   Review of systems:  denies shortness of breath Denies chest pain No n/v Still with foley  Objective Vitals:   07/26/21 1938 07/26/21 2334 07/27/21 0432 07/27/21 0434  BP: 124/67 114/73 130/89   Pulse: 88 83 79 80  Resp: 18 18 20 20   Temp: 97.8 F (36.6 C) 98.6 F (37 C)  97.9 F (36.6 C)  TempSrc: Oral Oral  Oral  SpO2:  93% 96% 94%  Weight:      Height:       Physical Exam     General adult male in bed in no acute distress HEENT normocephalic atraumatic extraocular movements intact sclera anicteric Neck supple trachea midline Lungs clear to auscultation bilaterally normal work of breathing at rest on room air Heart S1S2 no rub Abdomen soft nontender nondistended Extremities no edema  Psych normal mood and affect Neuro alert and oriented x 3 provides hx and follows commands GU has foley  Access: RIJ tunneled catheter    Additional Objective Labs: Basic Metabolic Panel: Recent Labs  Lab 07/25/21 0115 07/26/21 0057 07/27/21 0125  NA 135 134* 134*  K 3.9 3.8 3.6  CL 99 100 97*  CO2 26 25 28   GLUCOSE 124* 131* 151*  BUN 36* 48* 25*  CREATININE 3.39* 4.01* 2.54*  CALCIUM 7.8* 7.9* 7.7*  PHOS 4.3 4.2 3.2   Liver Function Tests: Recent Labs  Lab 07/25/21 0115 07/26/21 0057 07/27/21 0125  ALBUMIN 2.3* 2.3* 2.3*   CBC: Recent Labs  Lab 07/23/21 0340 07/23/21 1447 07/24/21 0223 07/25/21 0115 07/26/21 0057 07/27/21 0125  WBC 7.5  --  8.8 8.1 8.1 7.7  HGB 7.3*   < > 8.0* 7.7* 8.1* 7.7*  HCT 22.5*   < > 24.0* 23.3* 24.9* 23.7*  MCV 94.5  --  94.1 95.1 96.1 96.0  PLT 428*  --  438* 445* 390 388   < > = values in this interval not displayed.   Blood Culture    Component  Value Date/Time   SDES TRACHEAL ASPIRATE 07/12/2021 0817   SPECREQUEST NONE 07/12/2021 0817   CULT  07/12/2021 0817    Normal respiratory flora-no Staph aureus or Pseudomonas seen Performed at [facility] [address] [place], [place] [address]    REPTSTATUS 07/14/2021 FINAL 07/12/2021 0817   CBG: Recent Labs  Lab 07/25/21 2117 07/26/21 0611 07/26/21 1102 07/26/21 1738 07/26/21 2117  GLUCAP 207* 87 158* 275* 137*    Studies/Results: No results found. Medications:  promethazine (PHENERGAN) injection (IM or IVPB) Stopped (07/09/21 1628)    ALPRAZolam  1 mg Oral TID   amiodarone  200 mg Oral Daily   apixaban  5 mg Oral BID   atorvastatin  80 mg Oral Daily   calcium acetate  667 mg Oral TID WC   carvedilol  3.125 mg Oral BID WC   chlorhexidine  15 mL Mouth Rinse BID   Chlorhexidine Gluconate Cloth  6 each Topical Q0600   clopidogrel  75 mg Oral Daily   [START ON 07/28/2021] darbepoetin (ARANESP) injection - NON-DIALYSIS  100 mcg Subcutaneous Q Mon-1800   feeding supplement (NEPRO CARB STEADY)  237 mL Oral BID BM   hydrALAZINE  12.5 mg Oral TID   insulin aspart  3-9 Units Subcutaneous TID WC & HS   insulin detemir  20 Units Subcutaneous BID   isosorbide dinitrate  10 mg Oral TID   mouth rinse  15 mL Mouth Rinse q12n4p   multivitamin  1 tablet Oral QHS   pantoprazole  40 mg Oral Daily   sertraline  50 mg Oral Daily   sodium chloride flush  3 mL Intravenous Q12H   tamsulosin  0.4 mg Oral QHS   traZODone  100 mg Oral QHS     Assessment/Plan: 55yo M DM, HTN, A fib, CKD currently being followed for severe AKI s/p CABG x 4 and MAZE.   # Severe AKI on CKD 3b:   - Baseline 1.6 (Followed by Dr. Moshe Cipro outpt), was 1.9 prior to CABG  - AKI in setting of hemodynamic instability post op likely ATN but cholesterol embolic injury possible.  Initiated CRRT 10/27 for UF with ~anuria and volume overload.  Off of CRRT on 11/4 then iHD on 11/6.  Right IJ tunneled HD  catheter on 11/8 with IR ---------------------- - For now will attempt to monitor off of HD.  Assess dialysis needs daily and hope for improvement over time with his improved urine output and his Cr trends - Discontinue Foley catheter and monitor for retention via nursing protocol.  Bladder scan and in/out cath for post void residual of over 250 mL   # Anemia: iron deficiency.  - S/p Ferrelecit .  Transfuse as needed - rec'd PRBC's per CHF on 11/9.  - aranesp 40 mcg given on 11/7 and increased next dose to 100 mcg for 11/14 - anticipate need for PRBC's again soon  # Urinary retention - Foley catheter in place for retention on two bladder scans - now on Flomax nightly - as above d/c foley and monitor for retention  # Hyponatremia: Has improved with volume removal/RRT  # CKD stage 3b - predates admission - Baseline Cr 1.6 per charting and follows with Dr. Moshe Cipro outpatient  # CAD s/p CABG. With his dizziness would recommend medication titration as able - now on low doses of hydralazine, isosorbide, beta blocker.  Giving NS 250 mL once as well to see if improves  # A fib: amiodarone, anticoagulation per primary  # DM: per primary  # Hyperphosphatemia - renal diet  - has been on HD - improved; stop phoslo for now  # Shock: resolved.  No longer requiring norepinephrine or dobutamine.   # Acute hypoxic resp failure and concern for possible PNA : optimize volume with HD. Completed course of cefepime per charting   Claudia Desanctis, MD 07/27/2021 6:06 AM

## 2021-07-27 NOTE — Progress Notes (Signed)
Mobility Specialist Progress Note    07/27/21 1431  Mobility  Activity Contraindicated/medical hold   BP sitting: 119/75 then pt c/o feeling dizzy and while standing BP dropped to 92/57 and pt did not want to try and walk.  Hildred Alamin Mobility Specialist  Mobility Specialist Phone: [phone]

## 2021-07-27 NOTE — Progress Notes (Addendum)
[address]       [place] [address]             [phone]      19 Days Post-Op Procedure(s) (LRB): CORONARY ARTERY BYPASS GRAFTING (CABG) TIMES FOUR USING LEFT INTERNAL MAMMARY ARTERY, GREATER SAPHENOUS VEIN HARVESTED ENDOSCOPICALLY, AND LEFT RADIAL ARTERY HARVESTED OPEN (N/A) TRANSESOPHAGEAL ECHOCARDIOGRAM (TEE) (N/A) RADIAL ARTERY HARVEST (Left) ENDOVEIN HARVEST OF GREATER SAPHENOUS VEIN (Right) MAZE Subjective: Conts to feel well/better  Objective: Vital signs in last 24 hours: Temp:  [97.6 F (36.4 C)-98.6 F (37 C)] 98.5 F (36.9 C) (11/13 0738) Pulse Rate:  [79-88] 82 (11/13 0738) Cardiac Rhythm: Normal sinus rhythm (11/13 0714) Resp:  [15-25] 20 (11/13 0738) BP: (107-142)/(64-89) 131/78 (11/13 0738) SpO2:  [93 %-98 %] 98 % (11/13 0738) Weight:  [107.7 kg] 107.7 kg (11/13 0434)  Hemodynamic parameters for last 24 hours:    Intake/Output from previous day: 11/12 0701 - 11/13 0700 In: 960 [P.O.:720; I.V.:3; NG/GT:237] Out: 1450 [Urine:1450] Intake/Output this shift: Total I/O In: -  Out: 900 [Urine:900]  General appearance: alert, cooperative, and no distress Heart: regular rate and rhythm Lungs: mildly dim in bases Abdomen: benign Extremities: minor ankle/feet edema Wound: incis healing well  Lab Results: Recent Labs    07/26/21 0057 07/27/21 0125  WBC 8.1 7.7  HGB 8.1* 7.7*  HCT 24.9* 23.7*  PLT 390 388   BMET:  Recent Labs    07/26/21 0057 07/27/21 0125  NA 134* 134*  K 3.8 3.6  CL 100 97*  CO2 25 28  GLUCOSE 131* 151*  BUN 48* 25*  CREATININE 4.01* 2.54*  CALCIUM 7.9* 7.7*    PT/INR: No results for input(s): LABPROT, INR in the last 72 hours. ABG    Component Value Date/Time   PHART 7.459 (H) 07/16/2021 1330   HCO3 24.8 07/16/2021 1330   TCO2 26 07/16/2021 1330   ACIDBASEDEF 9.0 (H) 07/10/2021 0303   O2SAT 38.3 07/23/2021 1447   CBG (last 3)  Recent Labs    07/26/21 1738 07/26/21 2117  07/27/21 0646  GLUCAP 275* 137* 111*    Meds Scheduled Meds:  ALPRAZolam  1 mg Oral TID   amiodarone  200 mg Oral Daily   apixaban  5 mg Oral BID   atorvastatin  80 mg Oral Daily   carvedilol  3.125 mg Oral BID WC   chlorhexidine  15 mL Mouth Rinse BID   Chlorhexidine Gluconate Cloth  6 each Topical Q0600   clopidogrel  75 mg Oral Daily   [START ON 07/28/2021] darbepoetin (ARANESP) injection - NON-DIALYSIS  100 mcg Subcutaneous Q Mon-1800   feeding supplement (NEPRO CARB STEADY)  237 mL Oral BID BM   hydrALAZINE  12.5 mg Oral TID   insulin aspart  3-9 Units Subcutaneous TID WC & HS   insulin detemir  20 Units Subcutaneous BID   isosorbide dinitrate  10 mg Oral TID   mouth rinse  15 mL Mouth Rinse q12n4p   multivitamin  1 tablet Oral QHS   pantoprazole  40 mg Oral Daily   sertraline  50 mg Oral Daily   sodium chloride flush  3 mL Intravenous Q12H   tamsulosin  0.4 mg Oral QHS   traZODone  100 mg Oral QHS   Continuous Infusions:  promethazine (PHENERGAN) injection (IM or IVPB) Stopped (07/09/21 1628)   PRN Meds:.acetaminophen, albuterol, docusate, lip balm, metoprolol tartrate, ondansetron (ZOFRAN) IV, oxyCODONE, polyethylene glycol, promethazine (PHENERGAN) injection (IM  or IVPB), sennosides, simethicone  Xrays No results found.  Assessment/Plan: S/P Procedure(s) (LRB): CORONARY ARTERY BYPASS GRAFTING (CABG) TIMES FOUR USING LEFT INTERNAL MAMMARY ARTERY, GREATER SAPHENOUS VEIN HARVESTED ENDOSCOPICALLY, AND LEFT RADIAL ARTERY HARVESTED OPEN (N/A) TRANSESOPHAGEAL ECHOCARDIOGRAM (TEE) (N/A) RADIAL ARTERY HARVEST (Left) ENDOVEIN HARVEST OF GREATER SAPHENOUS VEIN (Right) MAZE  1 afeb, VSS 2 sats good on RA 3 conts to make urine 4 dialysis yesterday, plan currently is to stop and monitor- nephrology managing , foley to be d/c'd and UOP monitored /bladder scan- on flomax 5 AHF managing cardiac med titrations- they plan to re-evaluate tomorrow 6 H/H slightly lower- monitor  with replacement tx per nephrology , transfuse as needed 7 SNF search in place- will be easier if stays off of dialysis 8 BS quite variable- cont detemir/SSI- was on tradgenta and tresiba as outpatient        LOS: 20 days    Dorin Giovanni PA-C Pager [phone] 07/27/2021    Chart reviewed, patient examined, agree with above. Made 1450 cc urine yesterday and 900 cc so far today. Nephrology plans to monitor off HD for now.

## 2021-07-28 ENCOUNTER — Inpatient Hospital Stay (HOSPITAL_COMMUNITY): Payer: Medicare HMO

## 2021-07-28 DIAGNOSIS — I5021 Acute systolic (congestive) heart failure: Secondary | ICD-10-CM

## 2021-07-28 LAB — RENAL FUNCTION PANEL
Albumin: 2.3 g/dL — ABNORMAL LOW (ref 3.5–5.0)
Anion gap: 8 (ref 5–15)
BUN: 38 mg/dL — ABNORMAL HIGH (ref 6–20)
CO2: 26 mmol/L (ref 22–32)
Calcium: 8.1 mg/dL — ABNORMAL LOW (ref 8.9–10.3)
Chloride: 103 mmol/L (ref 98–111)
Creatinine, Ser: 3.07 mg/dL — ABNORMAL HIGH (ref 0.61–1.24)
GFR, Estimated: 23 mL/min — ABNORMAL LOW (ref >60)
Glucose, Bld: 124 mg/dL — ABNORMAL HIGH (ref 70–99)
Phosphorus: 3.8 mg/dL (ref 2.5–4.6)
Potassium: 4.3 mmol/L (ref 3.5–5.1)
Sodium: 137 mmol/L (ref 135–145)

## 2021-07-28 LAB — CBC
HCT: 28.1 % — ABNORMAL LOW (ref 39.0–52.0)
Hemoglobin: 8.9 g/dL — ABNORMAL LOW (ref 13.0–17.0)
MCH: 30.9 pg (ref 26.0–34.0)
MCHC: 31.7 g/dL (ref 30.0–36.0)
MCV: 97.6 fL (ref 80.0–100.0)
Platelets: 440 10*3/uL — ABNORMAL HIGH (ref 150–400)
RBC: 2.88 MIL/uL — ABNORMAL LOW (ref 4.22–5.81)
RDW: 14.3 % (ref 11.5–15.5)
WBC: 6.9 10*3/uL (ref 4.0–10.5)
nRBC: 0 % (ref 0.0–0.2)

## 2021-07-28 LAB — ECHOCARDIOGRAM COMPLETE
Area-P 1/2: 4.39 cm2
Calc EF: 41.2 %
Height: 74 in
S' Lateral: 4.3 cm
Single Plane A2C EF: 38.7 %
Single Plane A4C EF: 43.3 %
Weight: 3827.19 oz

## 2021-07-28 LAB — GLUCOSE, CAPILLARY
Glucose-Capillary: 95 mg/dL (ref 70–99)
Glucose-Capillary: 188 mg/dL — ABNORMAL HIGH (ref 70–99)
Glucose-Capillary: 188 mg/dL — ABNORMAL HIGH (ref 70–99)
Glucose-Capillary: 190 mg/dL — ABNORMAL HIGH (ref 70–99)

## 2021-07-28 LAB — MAGNESIUM: Magnesium: 1.8 mg/dL (ref 1.7–2.4)

## 2021-07-28 MED ORDER — POLYETHYLENE GLYCOL 3350 17 G PO PACK: 17.0000 g | PACK | Freq: Every day | ORAL | Status: DC | PRN

## 2021-07-28 MED ORDER — DOCUSATE SODIUM 50 MG/5ML PO LIQD: 100.0000 mg | Freq: Two times a day (BID) | ORAL | Status: DC | PRN

## 2021-07-28 MED ORDER — SENNOSIDES 8.8 MG/5ML PO SYRP
10.0000 mL | ORAL_SOLUTION | Freq: Every evening | ORAL | Status: DC | PRN
  Filled 2021-07-28: qty 10

## 2021-07-28 MED ORDER — MAGNESIUM SULFATE 2 GM/50ML IV SOLN
2.0000 g | Freq: Once | INTRAVENOUS | Status: AC
  Administered 2021-07-28: 2 g via INTRAVENOUS
  Filled 2021-07-28: qty 50

## 2021-07-28 NOTE — Progress Notes (Signed)
Occupational Therapy Treatment Patient Details Name: Billy [name] MRN: [id number] DOB: [birth year] Today's Date: 07/28/2021   History of present illness Pt is a 55 y.o. male admitted 07/07/21 with STEMI. S/p LHC with PCA. S/p CABGx4 with MAZE 10/25. Extubated post-op on 10/26; reintubated 10/27-11/1. On CRRT 10/27-11/4. iHD initiated 11/6. S/p R IJ tunneled HD cath placement 11/8. PMH includes visual impairment, afib, HTN, DM, R toe amputation, neuropathy, anxiety.   OT comments  Pt up in chair following PT, anticipates another walk with mobility tech this pm. Pt completed toileting and grooming with supervision and rollator. Pt recalling use of brakes on rollator and 3 energy conservation strategies from last session. Pt hoping to go home instead of SNF, will depend on support.    Recommendations for follow up therapy are one component of a multi-disciplinary discharge planning process, led by the attending physician.  Recommendations may be updated based on patient status, additional functional criteria and insurance authorization.    Follow Up Recommendations  Skilled nursing-short term rehab (<3 hours/day)    Assistance Recommended at Discharge Intermittent Supervision/Assistance  Equipment Recommendations  None recommended by OT    Recommendations for Other Services      Precautions / Restrictions Precautions Precautions: Fall;Other (comment);Sternal Precaution Comments: visually impaired Restrictions Weight Bearing Restrictions: No Other Position/Activity Restrictions: sternal precautions       Mobility Bed Mobility              General bed mobility comments: pt in chair    Transfers Overall transfer level: Needs assistance Equipment used: Rollator (4 wheels) Transfers: Sit to/from Stand Sit to Stand: Supervision           General transfer comment: cues for checking locks on rollator     Balance Overall balance assessment: Needs  assistance Sitting-balance support: Feet supported;No upper extremity supported Sitting balance-Leahy Scale: Good Sitting balance - Comments: Pt able to static sit EOB without physical assist Postural control: Right lateral lean Standing balance support: No upper extremity supported;During functional activity Standing balance-Leahy Scale: Fair Standing balance comment: no LOB today, statically at sink                           ADL either performed or assessed with clinical judgement   ADL Overall ADL's : Needs assistance/impaired     Grooming: Oral care;Wash/dry face;Standing;Supervision/safety                   Toilet Transfer: Supervision/safety;Comfort height toilet;Ambulation;Rolling walker (2 wheels)   Toileting- Clothing Manipulation and Hygiene: Supervision/safety;Sit to/from stand       Functional mobility during ADLs: Supervision/safety;Rolling walker (2 wheels)      Extremity/Trunk Assessment              Vision       Perception     Praxis      Cognition Arousal/Alertness: Awake/alert Behavior During Therapy: WFL for tasks assessed/performed Overall Cognitive Status: Impaired/Different from baseline Area of Impairment: Safety/judgement;Memory                     Memory: Decreased short-term memory   Safety/Judgement: Decreased awareness of deficits   Problem Solving: Slow processing General Comments: pt able to recall 3 energy conservation strategies as instructed last session with minimal prompts          Exercises     Shoulder Instructions       General Comments VSS on RA  Pertinent Vitals/ Pain       Pain Assessment: No/denies pain Pain Score: 0-No pain Pain Intervention(s): Monitored during session  Home Living                                          Prior Functioning/Environment              Frequency  Min 2X/week        Progress Toward Goals  OT Goals(current goals  can now be found in the care plan section)  Progress towards OT goals: Progressing toward goals  Acute Rehab OT Goals OT Goal Formulation: With patient Time For Goal Achievement: 07/30/21 Potential to Achieve Goals: Good  Plan Discharge plan remains appropriate    Co-evaluation                 AM-PAC OT "6 Clicks" Daily Activity     Outcome Measure   Help from another person eating meals?: None Help from another person taking care of personal grooming?: A Little Help from another person toileting, which includes using toliet, bedpan, or urinal?: A Little Help from another person bathing (including washing, rinsing, drying)?: A Little Help from another person to put on and taking off regular upper body clothing?: None Help from another person to put on and taking off regular lower body clothing?: A Little 6 Click Score: 20    End of Session Equipment Utilized During Treatment: Rollator (4 wheels)  OT Visit Diagnosis: Muscle weakness (generalized) (M62.81);Other symptoms and signs involving cognitive function;Pain   Activity Tolerance Patient tolerated treatment well   Patient Left in chair;with call bell/phone within reach   Nurse Communication          Time: 1027-2536 OT Time Calculation (min): 17 min  Charges:  1SC  [name], OTR/L Acute Rehabilitation Services Pager: [phone] Office: [phone]   [name] 07/28/2021, 3:21 PM

## 2021-07-28 NOTE — Plan of Care (Signed)

## 2021-07-28 NOTE — Progress Notes (Addendum)
[address]       [name] [address]             [phone]      20 Days Post-Op Procedure(s) (LRB): CORONARY ARTERY BYPASS GRAFTING (CABG) TIMES FOUR USING LEFT INTERNAL MAMMARY ARTERY, GREATER SAPHENOUS VEIN HARVESTED ENDOSCOPICALLY, AND LEFT RADIAL ARTERY HARVESTED OPEN (N/A) TRANSESOPHAGEAL ECHOCARDIOGRAM (TEE) (N/A) RADIAL ARTERY HARVEST (Left) ENDOVEIN HARVEST OF GREATER SAPHENOUS VEIN (Right) MAZE Subjective: Feels fine  Objective: Vital signs in last 24 hours: Temp:  [97.9 F (36.6 C)-98.5 F (36.9 C)] 98.1 F (36.7 C) (11/13 2324) Pulse Rate:  [79-87] 79 (11/13 2324) Cardiac Rhythm: Normal sinus rhythm (11/13 1916) Resp:  [16-20] 16 (11/13 2324) BP: (101-131)/(58-78) 101/60 (11/13 2324) SpO2:  [92 %-98 %] 92 % (11/13 2324)  Hemodynamic parameters for last 24 hours:    Intake/Output from previous day: 11/13 0701 - 11/14 0700 In: 483 [P.O.:480; I.V.:3] Out: 3300 [Urine:3300] Intake/Output this shift: No intake/output data recorded.  General appearance: alert, cooperative, and no distress Heart: regular rate and rhythm Lungs: clear to auscultation bilaterally Abdomen: benign Extremities: no edema Wound: incis healing well, left hand n/v intact  Lab Results: Recent Labs    07/26/21 0057 07/27/21 0125  WBC 8.1 7.7  HGB 8.1* 7.7*  HCT 24.9* 23.7*  PLT 390 388   BMET:  Recent Labs    07/27/21 0125 07/28/21 0334  NA 134* 137  K 3.6 4.3  CL 97* 103  CO2 28 26  GLUCOSE 151* 124*  BUN 25* 38*  CREATININE 2.54* 3.07*  CALCIUM 7.7* 8.1*    PT/INR: No results for input(s): LABPROT, INR in the last 72 hours. ABG    Component Value Date/Time   PHART 7.459 (H) 07/16/2021 1330   HCO3 24.8 07/16/2021 1330   TCO2 26 07/16/2021 1330   ACIDBASEDEF 9.0 (H) 07/10/2021 0303   O2SAT 38.3 07/23/2021 1447   CBG (last 3)  Recent Labs    07/27/21 1527 07/27/21 2112 07/28/21 0620  GLUCAP 169* 156* 95    Meds Scheduled Meds:   ALPRAZolam  1 mg Oral TID   amiodarone  200 mg Oral Daily   apixaban  5 mg Oral BID   atorvastatin  80 mg Oral Daily   carvedilol  3.125 mg Oral BID WC   chlorhexidine  15 mL Mouth Rinse BID   Chlorhexidine Gluconate Cloth  6 each Topical Q0600   clopidogrel  75 mg Oral Daily   darbepoetin (ARANESP) injection - NON-DIALYSIS  100 mcg Subcutaneous Q Mon-1800   feeding supplement (NEPRO CARB STEADY)  237 mL Oral BID BM   hydrALAZINE  12.5 mg Oral TID   insulin aspart  0-15 Units Subcutaneous TID WC   insulin detemir  20 Units Subcutaneous BID   isosorbide dinitrate  10 mg Oral TID   mouth rinse  15 mL Mouth Rinse q12n4p   multivitamin  1 tablet Oral QHS   pantoprazole  40 mg Oral Daily   sertraline  50 mg Oral Daily   sodium chloride flush  3 mL Intravenous Q12H   tamsulosin  0.4 mg Oral QHS   traZODone  100 mg Oral QHS   Continuous Infusions:  magnesium sulfate bolus IVPB     promethazine (PHENERGAN) injection (IM or IVPB) Stopped (07/09/21 1628)   PRN Meds:.acetaminophen, albuterol, docusate, lip balm, metoprolol tartrate, ondansetron (ZOFRAN) IV, oxyCODONE, polyethylene glycol, promethazine (PHENERGAN) injection (IM or IVPB), sennosides, simethicone  Xrays No results found.  Assessment/Plan: S/P Procedure(s) (LRB): CORONARY ARTERY BYPASS GRAFTING (CABG) TIMES FOUR USING LEFT INTERNAL MAMMARY ARTERY, GREATER SAPHENOUS VEIN HARVESTED ENDOSCOPICALLY, AND LEFT RADIAL ARTERY HARVESTED OPEN (N/A) TRANSESOPHAGEAL ECHOCARDIOGRAM (TEE) (N/A) RADIAL ARTERY HARVEST (Left) ENDOVEIN HARVEST OF GREATER SAPHENOUS VEIN (Right) MAZE  1 afeb, VSS, NSR, AHF managing cardiology med titrations- doing very well 2 sats good on RA, cont routine pulm toilet 3 >3 liters UOP, foley is out, creat did rise to 3.07- conts nephrology management, hopefully can stay off HD 4 sugars adeq control on insulin/SSI- not on preop meds yet 5 conts rehab 6 conts SNF bed search    LOS: 21 days    [name]  PA-C Pager [phone] 07/28/2021  Agree with above Doing well Continue physical therapy Will continue to watch renal function Dispo planning  [place]

## 2021-07-28 NOTE — Progress Notes (Signed)
Mobility Specialist Progress Note    07/28/21 1529  Mobility  Activity Ambulated in hall  Level of Assistance Contact guard assist, steadying assist  Assistive Device None  Distance Ambulated (ft) 810 ft (500+310)  Mobility Ambulated with assistance in hallway  Mobility Response Tolerated well  Mobility performed by Mobility specialist  Bed Position Chair  $Mobility charge 1 Mobility   Pre-Mobility: 83 HR, 106/65 BP During Mobility: 92 HR Post-Mobility: 89 HR, 103/67 BP  Pt received in chair and agreeable. C/o some dizziness that became minimal during walk. Took x2 standing rest breaks. Returned to chair with call bell in reach.   Hildred Alamin Mobility Specialist  Mobility Specialist Phone: [phone]

## 2021-07-28 NOTE — Progress Notes (Addendum)
Patient ID: Billy [name], male   DOB: 03/02/1966, 55 y.o.   MRN: [id number]     Advanced Heart Failure Rounding Note  PCP-Cardiologist: None   Subjective:    10/25: S/P CABG x4 with MAZE . Intra Op EF 20%.  10/27: Intubated. Hypotensive. Placed on Norepi + Vaso. Recurrent A fib with amio increased to 60 mg/hr.  10/29: Co-ox low, DBA added 11/1: Extubated. AF with RVR, amio gtt restarted 11/03: Dobutamine off 11/6: Started iHD  11/8: R IJ tunneled HD cath placed  Made >2.4 liters urine.   Last HD session 11/10  Received 1 U PRCs 11/09 for Hgb  7.7>>8.9  Scr up a little. 6.5 > 4.8 >3.4>4>2.5>3   Feels good today. Denies dizziness.    Objective:   Weight Range: 108.5 kg Body mass index is 30.71 kg/m.   Vital Signs:   Temp:  [97.9 F (36.6 C)-98.3 F (36.8 C)] 98.1 F (36.7 C) (11/13 2324) Pulse Rate:  [79-87] 79 (11/13 2324) Resp:  [16-20] 16 (11/13 2324) BP: (101-128)/(58-74) 101/60 (11/13 2324) SpO2:  [92 %-95 %] 92 % (11/13 2324) Weight:  [108.5 kg] 108.5 kg (11/13 2324) Last BM Date: 07/28/21  Weight change: Filed Weights   07/26/21 0320 07/27/21 0434 07/27/21 2324  Weight: 107.1 kg 107.7 kg 108.5 kg    Intake/Output:   Intake/Output Summary (Last 24 hours) at 07/28/2021 0740 Last data filed at 07/28/2021 0100 Gross per 24 hour  Intake 483 ml  Output 1500 ml  Net -1017 ml      Physical Exam  Sitting on the side of the bed.  No resp difficulty Tunneled HD cath on right. HEENT: normal Neck: supple. no JVD. Carotids 2+ bilat; no bruits. No lymphadenopathy or thryomegaly appreciated. Cor: PMI nondisplaced. Regular rate & rhythm. No rubs, gallops or murmurs. Lungs: clear Abdomen: soft, nontender, nondistended. No hepatosplenomegaly. No bruits or masses. Good bowel sounds. Extremities: no cyanosis, clubbing, rash, edema Neuro: alert & orientedx3, cranial nerves grossly intact. moves all 4 extremities w/o difficulty. Affect pleasant   Telemetry    NSr 70-80s   Labs    CBC Recent Labs    07/26/21 0057 07/27/21 0125  WBC 8.1 7.7  HGB 8.1* 7.7*  HCT 24.9* 23.7*  MCV 96.1 96.0  PLT 390 676   Basic Metabolic Panel Recent Labs    07/27/21 0125 07/28/21 0334  NA 134* 137  K 3.6 4.3  CL 97* 103  CO2 28 26  GLUCOSE 151* 124*  BUN 25* 38*  CREATININE 2.54* 3.07*  CALCIUM 7.7* 8.1*  MG 2.0 1.8  PHOS 3.2 3.8   Liver Function Tests Recent Labs    07/27/21 0125 07/28/21 0334  ALBUMIN 2.3* 2.3*   No results for input(s): LIPASE, AMYLASE in the last 72 hours. Cardiac Enzymes No results for input(s): CKTOTAL, CKMB, CKMBINDEX, TROPONINI in the last 72 hours.  BNP: BNP (last 3 results) No results for input(s): BNP in the last 8760 hours.  ProBNP (last 3 results) No results for input(s): PROBNP in the last 8760 hours.   D-Dimer No results for input(s): DDIMER in the last 72 hours. Hemoglobin A1C No results for input(s): HGBA1C in the last 72 hours.  Fasting Lipid Panel No results for input(s): CHOL, HDL, LDLCALC, TRIG, CHOLHDL, LDLDIRECT in the last 72 hours.   Thyroid Function Tests No results for input(s): TSH, T4TOTAL, T3FREE, THYROIDAB in the last 72 hours.  Invalid input(s): FREET3  Other results:   Imaging  No results found.   Medications:     Scheduled Medications:  ALPRAZolam  1 mg Oral TID   amiodarone  200 mg Oral Daily   apixaban  5 mg Oral BID   atorvastatin  80 mg Oral Daily   carvedilol  3.125 mg Oral BID WC   chlorhexidine  15 mL Mouth Rinse BID   Chlorhexidine Gluconate Cloth  6 each Topical Q0600   clopidogrel  75 mg Oral Daily   darbepoetin (ARANESP) injection - NON-DIALYSIS  100 mcg Subcutaneous Q Mon-1800   feeding supplement (NEPRO CARB STEADY)  237 mL Oral BID BM   hydrALAZINE  12.5 mg Oral TID   insulin aspart  0-15 Units Subcutaneous TID WC   insulin detemir  20 Units Subcutaneous BID   isosorbide dinitrate  10 mg Oral TID   mouth rinse  15 mL Mouth Rinse  q12n4p   multivitamin  1 tablet Oral QHS   pantoprazole  40 mg Oral Daily   sertraline  50 mg Oral Daily   sodium chloride flush  3 mL Intravenous Q12H   tamsulosin  0.4 mg Oral QHS   traZODone  100 mg Oral QHS    Infusions:  magnesium sulfate bolus IVPB     promethazine (PHENERGAN) injection (IM or IVPB) Stopped (07/09/21 1628)    PRN Medications: acetaminophen, albuterol, docusate, lip balm, metoprolol tartrate, ondansetron (ZOFRAN) IV, oxyCODONE, polyethylene glycol, promethazine (PHENERGAN) injection (IM or IVPB), sennosides, simethicone    Patient Profile   Billy [name] is a 12 year with a history of visually impaired, DM, HTN, depression, anxiety takes 2 mg xanax 3 times a day, SVT, CKD Stage II, PAF, and CAD.Marland Kitchen   Anterior Stemi--> CABG--> cardiogenic shock.   Assessment/Plan   1. CAD: Admitted with anterolateral MI from occluded diagonal treated with DES.  Patient then had CABG with LIMA-LAD, SVG-D, SVG-PDA, and left radial-OM for complete revascularization on 10/25   - Now off ASA given need for Plavix and anticoagulation.  - Continue Plavix 75 daily x 6 months post-PCI.  - atorvastatin 80 mg daily.  - Amlodipine for radial harvest held with hypotension on pressors.    - No chest pain 2. Atrial fibrillation: Paroxysmal, s/p Maze and appendage clipping.  Having brief runs of atrial fibrillation but not sustained.  -Maintaining NSR - Continue PO amio 200 mg daily  .  - Eliquis 5 mg bid  3. AKI on CKD stage 3: Baseline creatinine around 1.6, up to 5 post-op.  He has likely had multiple hits including contrast with initial cath/PCI and possible peri-operative hypotension.  Suspect ATN. Concerned about chances of renal recovery. Stopped  CVVHD 11/4 -11/6 started iHD, 11/08 R IJ tunneled HD catheter placed.  - Last HD session 11/12/  - Making >2.4 liters yesterday. UOP encouraging for possible eventual renal recovery 4. Acute systolic CHF: Ischemic cardiomyopathy.  Echo prior  to this admission with EF 50-55% (pre-MI and CABG).  Limited echo on 10/28 showed minimal pericardial effusion, EF in 25% range. Intraoperative TEE with EF 20%. Off  pressors.  - watch pressure closely.  - Volume status stable. Last HD session as above 11/12 - Continue hydralazine 12.5 mg tid + isordil 10 tid. Will not titrate further to allow adequate BP for renal perfusion - Continue  coreg 3.125 mg BID - No ARN/SGLT2 with AKI 5. Anemia: Had 1 unit pRBCs 11/02 + 1 unit 11/9. Hgb 7.3>>8.0>>7.7>>pending.  - Getting IV iron with HD and aranesp.  - No obvious  source. Monitor pending.  - Course of cefepime completed 7. Acute hypoxemic respiratory failure: Suspect primarily pulmonary edema, intubated. Possible PNA with bibasilar lung opacities. Extubated, completed course of cefepime, volume status now looks good. O2 stable on room air.   8. Thrombocytopenia: Resolved.  9. Right sided weakness: Normal head CT 11/03 10. Urinary Retention  -Foley placed 11/7. Foley out 11/13. No difficulty urinating -Continue flomax  CBC pending.   Amy Clegg NP-C  07/28/2021 7:40 AM  Patient seen with NP, agree with the above note.   Good urine output, creatinine up a bit to 2.54=>3.07. Hgb 8.9.  He remains in NSR.   General: NAD Neck: No JVD, no thyromegaly or thyroid nodule.  Lungs: Clear to auscultation bilaterally with normal respiratory effort. CV: Nondisplaced PMI.  Heart regular S1/S2, no S3/S4, no murmur.  No peripheral edema.   Abdomen: Soft, nontender, no hepatosplenomegaly, no distention.  Skin: Intact without lesions or rashes.  Neurologic: Alert and oriented x 3.  Psych: Normal affect. Extremities: No clubbing or cyanosis.  HEENT: Normal.   Doing well, walking in halls.  Waiting to determine need for HD.  Nephrology is following, with good UOP and volume status ok, no definite need for HD currently.   He will continue Plavix x 6 months + Eliquis.  He remains in NSR on amiodarone.    Would repeat full echo to reassess EF post-op now that he is out of ICU and can be positioned. Echoes in unit were technically difficult with significantly variable interpretations.   Loralie Champagne 07/28/2021 8:48 AM

## 2021-07-28 NOTE — Progress Notes (Signed)
Physical Therapy Treatment Patient Details Name: Billy [name] MRN: [id number] DOB: [birth year] Today's Date: 07/28/2021   History of Present Illness Pt is a 55 y.o. male admitted 07/07/21 with STEMI. S/p LHC with PCA. S/p CABGx4 with MAZE 10/25. Extubated post-op on 10/26; reintubated 10/27-11/1. On CRRT 10/27-11/4. iHD initiated 11/6. S/p R IJ tunneled HD cath placement 11/8. PMH includes visual impairment, afib, HTN, DM, R toe amputation, neuropathy, anxiety.    PT Comments    The pt was able to demo great progress with OOB mobility and activity tolerance this session as he had no change in BP with prolonged mobility at this time. The pt was able to consistently demo good safety awareness and planning for how to manage mobility at home. He was able to complete 3x42ft with no use of AD and short seated rest break between each bout of ambulation. He did have single instance of LOB requiring minA to recover, but was steady with at least single UE support and therefore we discussed use of rollator for safety with mobility after d/c. The pt continues to express some reservations regarding d/c home, but reports he has good understanding of education regarding safe progression of mobility. Suspect he will continue to progress and could be safe to return home with family assist.   Recommendations for follow up therapy are one component of a multi-disciplinary discharge planning process, led by the attending physician.  Recommendations may be updated based on patient status, additional functional criteria and insurance authorization.  Follow Up Recommendations  Skilled nursing-short term rehab (<3 hours/day) (progressing well and could likely d/c home with intermittent supervision from family)     Assistance Recommended at Discharge Intermittent Supervision/Assistance  Equipment Recommendations  Rollator (4 wheels)    Recommendations for Other Services       Precautions / Restrictions  Precautions Precautions: Fall;Other (comment);Sternal Precaution Comments: visually impaired Restrictions Weight Bearing Restrictions: Yes Other Position/Activity Restrictions: sternal precautions     Mobility  Bed Mobility Overal bed mobility: Needs Assistance             General bed mobility comments: pt OOB at start and end of session    Transfers Overall transfer level: Needs assistance Equipment used: None Transfers: Sit to/from Stand Sit to Stand: Supervision           General transfer comment: cues for checking locks on rollator    Ambulation/Gait Ambulation/Gait assistance: Min guard;Min assist Gait Distance (Feet): 400 Feet (x3) Assistive device: None Gait Pattern/deviations: Step-through pattern;Decreased stride length;Narrow base of support Gait velocity: 0.8 m/s Gait velocity interpretation: 1.31 - 2.62 ft/sec, indicative of limited community ambulator   General Gait Details: pt completing 3 x 400 ft bouts of hallway ambulaiton wihtout need for UE support. pt reaching for rail x5 instances, single LOB due to scissoring steps   Stairs             Wheelchair Mobility    Modified Rankin (Stroke Patients Only)       Balance Overall balance assessment: Needs assistance Sitting-balance support: Feet supported;No upper extremity supported Sitting balance-Leahy Scale: Good Sitting balance - Comments: Pt able to static sit EOB without physical assist Postural control: Right lateral lean Standing balance support: No upper extremity supported;During functional activity Standing balance-Leahy Scale: Fair Standing balance comment: pt with single LOB wiht gait, otherwise able to ambulate without UE support  Cognition Arousal/Alertness: Awake/alert Behavior During Therapy: WFL for tasks assessed/performed Overall Cognitive Status: Impaired/Different from baseline Area of Impairment:  Memory;Safety/judgement;Problem solving                     Memory: Decreased short-term memory   Safety/Judgement: Decreased awareness of deficits   Problem Solving: Slow processing General Comments: pt with decreased awareness of safety/wayfinding. repeating instructions and strategies for home multiple times through session        Exercises      General Comments General comments (skin integrity, edema, etc.): VSS on RA      Pertinent Vitals/Pain Pain Score: 0-No pain Pain Intervention(s): Monitored during session    Home Living                          Prior Function            PT Goals (current goals can now be found in the care plan section) Acute Rehab PT Goals Patient Stated Goal: get home for Thanksgiving PT Goal Formulation: With patient Time For Goal Achievement: 08/04/21 Potential to Achieve Goals: Good Progress towards PT goals: Progressing toward goals    Frequency    Min 3X/week      PT Plan Current plan remains appropriate    Co-evaluation              AM-PAC PT "6 Clicks" Mobility   Outcome Measure  Help needed turning from your back to your side while in a flat bed without using bedrails?: A Little Help needed moving from lying on your back to sitting on the side of a flat bed without using bedrails?: A Little Help needed moving to and from a bed to a chair (including a wheelchair)?: A Little Help needed standing up from a chair using your arms (e.g., wheelchair or bedside chair)?: A Little Help needed to walk in hospital room?: A Little Help needed climbing 3-5 steps with a railing? : A Lot 6 Click Score: 17    End of Session Equipment Utilized During Treatment: Gait belt Activity Tolerance: Patient tolerated treatment well Patient left: in chair;with call bell/phone within reach Nurse Communication: Mobility status PT Visit Diagnosis: Other abnormalities of gait and mobility (R26.89);Muscle weakness  (generalized) (M62.81);Unsteadiness on feet (R26.81)     Time: 2025-4270 PT Time Calculation (min) (ACUTE ONLY): 26 min  Charges:  $Gait Training: 23-37 mins                     West Carbo, PT, DPT   Acute Rehabilitation Department Pager #: [phone]   Sandra Cockayne 07/28/2021, 2:37 PM

## 2021-07-28 NOTE — Progress Notes (Signed)
  Echocardiogram 2D Echocardiogram has been performed.  Fidel Levy 07/28/2021, 10:11 AM

## 2021-07-28 NOTE — Progress Notes (Signed)
Patient ID: Billy [name], male   DOB: 1965-12-27, 55 y.o.   MRN: [id number] S: Feels well, no complaints. O:BP 122/77 (BP Location: Right Arm)   Pulse 91   Temp 98 F (36.7 C) (Oral)   Resp 16   Ht 6\' 2"  (1.88 m)   Wt 108.5 kg   SpO2 92%   BMI 30.71 kg/m   Intake/Output Summary (Last 24 hours) at 07/28/2021 0944 Last data filed at 07/28/2021 0810 Gross per 24 hour  Intake 543 ml  Output 2300 ml  Net -1757 ml   Intake/Output: I/O last 3 completed shifts: In: 086 [P.O.:720; I.V.:6; NG/GT:237] Out: 3300 [Urine:3300]  Intake/Output this shift:  Total I/O In: 60 [P.O.:60] Out: 800 [Urine:800] Weight change: 0.8 kg Gen: NAD CVS: RRR Resp: CTA Abd: +BS, soft, NT?ND Ext: no edema  Recent Labs  Lab 07/22/21 0054 07/23/21 0340 07/24/21 0223 07/25/21 0115 07/26/21 0057 07/27/21 0125 07/28/21 0334  NA 132* 137 136 135 134* 134* 137  K 4.4 3.7 3.8 3.9 3.8 3.6 4.3  CL 98 101 102 99 100 97* 103  CO2 22 26 26 26 25 28 26   GLUCOSE 140* 131* 138* 124* 131* 151* 124*  BUN 80* 54* 66* 36* 48* 25* 38*  CREATININE 6.50* 4.53* 4.79* 3.39* 4.01* 2.54* 3.07*  ALBUMIN 2.3* 2.2* 2.3* 2.3* 2.3* 2.3* 2.3*  CALCIUM 8.0* 8.0* 8.0* 7.8* 7.9* 7.7* 8.1*  PHOS 7.7* 5.0* 4.8* 4.3 4.2 3.2 3.8   Liver Function Tests: Recent Labs  Lab 07/26/21 0057 07/27/21 0125 07/28/21 0334  ALBUMIN 2.3* 2.3* 2.3*   No results for input(s): LIPASE, AMYLASE in the last 168 hours. No results for input(s): AMMONIA in the last 168 hours. CBC: Recent Labs  Lab 07/24/21 0223 07/25/21 0115 07/26/21 0057 07/27/21 0125 07/28/21 0740  WBC 8.8 8.1 8.1 7.7 6.9  HGB 8.0* 7.7* 8.1* 7.7* 8.9*  HCT 24.0* 23.3* 24.9* 23.7* 28.1*  MCV 94.1 95.1 96.1 96.0 97.6  PLT 438* 445* 390 388 440*   Cardiac Enzymes: No results for input(s): CKTOTAL, CKMB, CKMBINDEX, TROPONINI in the last 168 hours. CBG: Recent Labs  Lab 07/27/21 0646 07/27/21 1120 07/27/21 1527 07/27/21 2112 07/28/21 0620  GLUCAP 111* 160*  169* 156* 95    Iron Studies: No results for input(s): IRON, TIBC, TRANSFERRIN, FERRITIN in the last 72 hours. Studies/Results: No results found.  ALPRAZolam  1 mg Oral TID   amiodarone  200 mg Oral Daily   apixaban  5 mg Oral BID   atorvastatin  80 mg Oral Daily   carvedilol  3.125 mg Oral BID WC   chlorhexidine  15 mL Mouth Rinse BID   Chlorhexidine Gluconate Cloth  6 each Topical Q0600   clopidogrel  75 mg Oral Daily   darbepoetin (ARANESP) injection - NON-DIALYSIS  100 mcg Subcutaneous Q Mon-1800   feeding supplement (NEPRO CARB STEADY)  237 mL Oral BID BM   hydrALAZINE  12.5 mg Oral TID   insulin aspart  0-15 Units Subcutaneous TID WC   insulin detemir  20 Units Subcutaneous BID   isosorbide dinitrate  10 mg Oral TID   mouth rinse  15 mL Mouth Rinse q12n4p   multivitamin  1 tablet Oral QHS   pantoprazole  40 mg Oral Daily   sertraline  50 mg Oral Daily   sodium chloride flush  3 mL Intravenous Q12H   tamsulosin  0.4 mg Oral QHS   traZODone  100 mg Oral QHS    BMET  Component Value Date/Time   NA 137 07/28/2021 0334   K 4.3 07/28/2021 0334   CL 103 07/28/2021 0334   CO2 26 07/28/2021 0334   GLUCOSE 124 (H) 07/28/2021 0334   BUN 38 (H) 07/28/2021 0334   BUN 31 02/26/2016 0000   CREATININE 3.07 (H) 07/28/2021 0334   CREATININE 1.02 09/09/2015 1710   CALCIUM 8.1 (L) 07/28/2021 0334   GFRNONAA 23 (L) 07/28/2021 0334   GFRAA >60 07/07/2017 0429   CBC    Component Value Date/Time   WBC 6.9 07/28/2021 0740   RBC 2.88 (L) 07/28/2021 0740   HGB 8.9 (L) 07/28/2021 0740   HCT 28.1 (L) 07/28/2021 0740   PLT 440 (H) 07/28/2021 0740   MCV 97.6 07/28/2021 0740   MCH 30.9 07/28/2021 0740   MCHC 31.7 07/28/2021 0740   RDW 14.3 07/28/2021 0740   LYMPHSABS 1.1 07/16/2021 0415   MONOABS 0.5 07/16/2021 0415   EOSABS 0.0 07/16/2021 0415   BASOSABS 0.0 07/16/2021 0415     Assessment/Plan:  AKI/CKD stage IIIb - presumably ischemic ATN in setting of hemodynamic  instability post CABG +/- cholesterol emboli.  Started on CRRT 10/27 due to anuria and volume overload and transitioned to iHD on 11/6, RIJ TDC placed 07/22/21.  Last HD session 07/26/21 but now with increased UOP and slower rise of Scr between HD sessions.  Will hold off on further HD for now and continue to monitor for renal recovery.  Scr was 1.9 prior to CABG (followed by Dr. Moshe Cipro) CAD s/p CABG- per primary ABLA - low iron s/p IV iron.  On Aranesp. Continue to follow. A fib - on amiodarone and anticoagulation per primary. Urinary retention - s/p foley catheter removal.  Good UOP and continue to follow.   Donetta Potts, MD [name] [phone]

## 2021-07-28 NOTE — Progress Notes (Signed)
CARDIAC REHAB PHASE I   PRE:  Rate/Rhythm: 86 SR    BP: sitting 122/73    SaO2: 96 RA  MODE:  Ambulation: 800 ft (400 ft x2)  POST:  Rate/Rhythm: 97 SR    BP: sitting 122/77     SaO2: 99 RA  Pt very motivated this am, feeling well. Stood independently and walked with rollator. Needed help with direction in hall due to poor eyesight. SOB/exerted after 1 lap therefore parked rollator and sat for 4 min. Decreased pace on second lap and tolerated well. To EOB. 2000 ml on IS. Encouraged x2 more walks today. Pt to talk with sister re poss d/c home. [place], ACSM 07/28/2021 8:40 AM

## 2021-07-29 LAB — CBC
HCT: 24.4 % — ABNORMAL LOW (ref 39.0–52.0)
Hemoglobin: 7.9 g/dL — ABNORMAL LOW (ref 13.0–17.0)
MCH: 31.1 pg (ref 26.0–34.0)
MCHC: 32.4 g/dL (ref 30.0–36.0)
MCV: 96.1 fL (ref 80.0–100.0)
Platelets: 297 10*3/uL (ref 150–400)
RBC: 2.54 MIL/uL — ABNORMAL LOW (ref 4.22–5.81)
RDW: 14 % (ref 11.5–15.5)
WBC: 5.4 10*3/uL (ref 4.0–10.5)
nRBC: 0 % (ref 0.0–0.2)

## 2021-07-29 LAB — RENAL FUNCTION PANEL
Albumin: 2.5 g/dL — ABNORMAL LOW (ref 3.5–5.0)
Anion gap: 9 (ref 5–15)
BUN: 48 mg/dL — ABNORMAL HIGH (ref 6–20)
CO2: 26 mmol/L (ref 22–32)
Calcium: 8.2 mg/dL — ABNORMAL LOW (ref 8.9–10.3)
Chloride: 102 mmol/L (ref 98–111)
Creatinine, Ser: 3.23 mg/dL — ABNORMAL HIGH (ref 0.61–1.24)
GFR, Estimated: 22 mL/min — ABNORMAL LOW (ref >60)
Glucose, Bld: 161 mg/dL — ABNORMAL HIGH (ref 70–99)
Phosphorus: 4.3 mg/dL (ref 2.5–4.6)
Potassium: 3.8 mmol/L (ref 3.5–5.1)
Sodium: 137 mmol/L (ref 135–145)

## 2021-07-29 LAB — GLUCOSE, CAPILLARY
Glucose-Capillary: 81 mg/dL (ref 70–99)
Glucose-Capillary: 222 mg/dL — ABNORMAL HIGH (ref 70–99)
Glucose-Capillary: 142 mg/dL — ABNORMAL HIGH (ref 70–99)
Glucose-Capillary: 132 mg/dL — ABNORMAL HIGH (ref 70–99)

## 2021-07-29 LAB — MAGNESIUM: Magnesium: 2.2 mg/dL (ref 1.7–2.4)

## 2021-07-29 MED ORDER — ISOSORBIDE MONONITRATE ER 30 MG PO TB24
30.0000 mg | ORAL_TABLET | Freq: Every day | ORAL | Status: DC
  Administered 2021-07-29 – 2021-08-02 (×5): 30 mg via ORAL
  Filled 2021-07-29 (×5): qty 1

## 2021-07-29 NOTE — Progress Notes (Signed)
Claimed that he managed to ambulate along the hallway  3 x today. without any assistive device.

## 2021-07-29 NOTE — Progress Notes (Signed)
[address]       [place] [address]             [phone]      21 Days Post-Op Procedure(s) (LRB): CORONARY ARTERY BYPASS GRAFTING (CABG) TIMES FOUR USING LEFT INTERNAL MAMMARY ARTERY, GREATER SAPHENOUS VEIN HARVESTED ENDOSCOPICALLY, AND LEFT RADIAL ARTERY HARVESTED OPEN (N/A) TRANSESOPHAGEAL ECHOCARDIOGRAM (TEE) (N/A) RADIAL ARTERY HARVEST (Left) ENDOVEIN HARVEST OF GREATER SAPHENOUS VEIN (Right) MAZE Subjective: Feels fine  Objective: Vital signs in last 24 hours: Temp:  [98 F (36.7 C)-98.7 F (37.1 C)] 98 F (36.7 C) (11/15 0439) Pulse Rate:  [77-91] 82 (11/15 0656) Cardiac Rhythm: Normal sinus rhythm (11/14 1900) Resp:  [16-20] 20 (11/15 0439) BP: (103-138)/(64-85) 138/85 (11/15 0656) SpO2:  [90 %-98 %] 98 % (11/15 0439) Weight:  [109 kg] 109 kg (11/15 0439)  Hemodynamic parameters for last 24 hours:    Intake/Output from previous day: 11/14 0701 - 11/15 0700 In: 774 [P.O.:300; NG/GT:474] Out: 2975 [Urine:2975] Intake/Output this shift: No intake/output data recorded.  General appearance: alert, cooperative, and no distress Heart: regular rate and rhythm Lungs: clear to auscultation bilaterally Abdomen: benbign Extremities: no edema Wound: incis healing well  Lab Results: Recent Labs    07/28/21 0740 07/29/21 0101  WBC 6.9 5.4  HGB 8.9* 7.9*  HCT 28.1* 24.4*  PLT 440* 297   BMET:  Recent Labs    07/28/21 0334 07/29/21 0101  NA 137 137  K 4.3 3.8  CL 103 102  CO2 26 26  GLUCOSE 124* 161*  BUN 38* 48*  CREATININE 3.07* 3.23*  CALCIUM 8.1* 8.2*    PT/INR: No results for input(s): LABPROT, INR in the last 72 hours. ABG    Component Value Date/Time   PHART 7.459 (H) 07/16/2021 1330   HCO3 24.8 07/16/2021 1330   TCO2 26 07/16/2021 1330   ACIDBASEDEF 9.0 (H) 07/10/2021 0303   O2SAT 38.3 07/23/2021 1447   CBG (last 3)  Recent Labs    07/28/21 1702 07/28/21 2146 07/29/21 0637  GLUCAP 188* 190* 81     Meds Scheduled Meds:  ALPRAZolam  1 mg Oral TID   amiodarone  200 mg Oral Daily   apixaban  5 mg Oral BID   atorvastatin  80 mg Oral Daily   carvedilol  3.125 mg Oral BID WC   chlorhexidine  15 mL Mouth Rinse BID   Chlorhexidine Gluconate Cloth  6 each Topical Q0600   clopidogrel  75 mg Oral Daily   darbepoetin (ARANESP) injection - NON-DIALYSIS  100 mcg Subcutaneous Q Mon-1800   feeding supplement (NEPRO CARB STEADY)  237 mL Oral BID BM   hydrALAZINE  12.5 mg Oral TID   insulin aspart  0-15 Units Subcutaneous TID WC   insulin detemir  20 Units Subcutaneous BID   isosorbide dinitrate  10 mg Oral TID   mouth rinse  15 mL Mouth Rinse q12n4p   multivitamin  1 tablet Oral QHS   pantoprazole  40 mg Oral Daily   sertraline  50 mg Oral Daily   sodium chloride flush  3 mL Intravenous Q12H   tamsulosin  0.4 mg Oral QHS   traZODone  100 mg Oral QHS   Continuous Infusions:  promethazine (PHENERGAN) injection (IM or IVPB) Stopped (07/09/21 1628)   PRN Meds:.acetaminophen, albuterol, docusate, lip balm, metoprolol tartrate, ondansetron (ZOFRAN) IV, oxyCODONE, polyethylene glycol, promethazine (PHENERGAN) injection (IM or IVPB), sennosides, simethicone  Xrays ECHOCARDIOGRAM COMPLETE  Result Date: 07/28/2021  ECHOCARDIOGRAM REPORT   Patient Name:   Billy [name] Date of Exam: 07/28/2021 Medical Rec #:  [id number]      Height:       74.0 in Accession #:    [id number]     Weight:       239.2 lb Date of Birth:  1966-01-09      BSA:          2.345 m Patient Age:    55 years       BP:           122/77 mmHg Patient Gender: M              HR:           82 bpm. Exam Location:  Inpatient Procedure: 2D Echo, Cardiac Doppler and Color Doppler Indications:    CHF-Acute Systolic U54.27  History:        Patient has prior history of Echocardiogram examinations, most                 recent 07/18/2021. Risk Factors:Diabetes.  Sonographer:    Bernadene Person RDCS Referring Phys: [place]  1. Left ventricular ejection fraction, by estimation, is 40 to 45%. The left ventricle has mildly decreased function. The left ventricle demonstrates global hypokinesis. There is mild concentric left ventricular hypertrophy. Left ventricular diastolic parameters are consistent with Grade II diastolic dysfunction (pseudonormalization).  2. Right ventricular systolic function is mildly reduced. The right ventricular size is not well visualized. There is normal pulmonary artery systolic pressure.  3. Left atrial size was mildly dilated.  4. The mitral valve is normal in structure. Trivial mitral valve regurgitation. No evidence of mitral stenosis.  5. The aortic valve is normal in structure. Aortic valve regurgitation is not visualized. Aortic valve sclerosis is present, with no evidence of aortic valve stenosis.  6. Aortic dilatation noted. There is mild dilatation of the ascending aorta, measuring 39 mm.  7. The inferior vena cava is normal in size with greater than 50% respiratory variability, suggesting right atrial pressure of 3 mmHg. FINDINGS  Left Ventricle: Left ventricular ejection fraction, by estimation, is 40 to 45%. The left ventricle has mildly decreased function. The left ventricle demonstrates global hypokinesis. The left ventricular internal cavity size was normal in size. There is  mild concentric left ventricular hypertrophy. Left ventricular diastolic parameters are consistent with Grade II diastolic dysfunction (pseudonormalization). Right Ventricle: The right ventricular size is not well visualized. No increase in right ventricular wall thickness. Right ventricular systolic function is mildly reduced. There is normal pulmonary artery systolic pressure. The tricuspid regurgitant velocity is 2.41 m/s, and with an assumed right atrial pressure of 3 mmHg, the estimated right ventricular systolic pressure is 06.2 mmHg. Left Atrium: Left atrial size was mildly dilated. Right Atrium: Right  atrial size was normal in size. Pericardium: There is no evidence of pericardial effusion. Mitral Valve: The mitral valve is normal in structure. Mild mitral annular calcification. Trivial mitral valve regurgitation. No evidence of mitral valve stenosis. Tricuspid Valve: The tricuspid valve is normal in structure. Tricuspid valve regurgitation is not demonstrated. No evidence of tricuspid stenosis. Aortic Valve: The aortic valve is normal in structure. Aortic valve regurgitation is not visualized. Aortic valve sclerosis is present, with no evidence of aortic valve stenosis. Pulmonic Valve: The pulmonic valve was not well visualized. Pulmonic valve regurgitation is not visualized. No evidence of pulmonic stenosis. Aorta: Aortic dilatation noted. There is mild dilatation  of the ascending aorta, measuring 39 mm. Venous: The inferior vena cava is normal in size with greater than 50% respiratory variability, suggesting right atrial pressure of 3 mmHg. IAS/Shunts: No atrial level shunt detected by color flow Doppler.  LEFT VENTRICLE PLAX 2D LVIDd:         5.10 cm      Diastology LVIDs:         4.30 cm      LV e' medial:    7.18 cm/s LV PW:         1.20 cm      LV E/e' medial:  18.2 LV IVS:        1.30 cm      LV e' lateral:   11.30 cm/s LVOT diam:     2.10 cm      LV E/e' lateral: 11.6 LV SV:         60 LV SV Index:   25 LVOT Area:     3.46 cm  LV Volumes (MOD) LV vol d, MOD A2C: 181.0 ml LV vol d, MOD A4C: 176.0 ml LV vol s, MOD A2C: 111.0 ml LV vol s, MOD A4C: 99.8 ml LV SV MOD A2C:     70.0 ml LV SV MOD A4C:     176.0 ml LV SV MOD BP:      76.8 ml RIGHT VENTRICLE RV S prime:     8.59 cm/s TAPSE (M-mode): 1.3 cm LEFT ATRIUM             Index        RIGHT ATRIUM           Index LA diam:        3.70 cm 1.58 cm/m   RA Area:     15.00 cm LA Vol (A2C):   73.2 ml 31.21 ml/m  RA Volume:   32.60 ml  13.90 ml/m LA Vol (A4C):   97.5 ml 41.57 ml/m LA Biplane Vol: 90.0 ml 38.38 ml/m  AORTIC VALVE LVOT Vmax:   89.90 cm/s LVOT  Vmean:  59.900 cm/s LVOT VTI:    0.172 m  AORTA Ao Root diam: 3.90 cm Ao Asc diam:  3.90 cm MITRAL VALVE                TRICUSPID VALVE MV Area (PHT): 4.39 cm     TR Peak grad:   23.2 mmHg MV Decel Time: 173 msec     TR Vmax:        241.00 cm/s MV E velocity: 131.00 cm/s MV A velocity: 72.60 cm/s   SHUNTS MV E/A ratio:  1.80         Systemic VTI:  0.17 m                             Systemic Diam: 2.10 cm Kardie Tobb DO Electronically signed by Berniece Salines DO Signature Date/Time: 07/28/2021/7:15:39 PM    Final     Assessment/Plan: S/P Procedure(s) (LRB): CORONARY ARTERY BYPASS GRAFTING (CABG) TIMES FOUR USING LEFT INTERNAL MAMMARY ARTERY, GREATER SAPHENOUS VEIN HARVESTED ENDOSCOPICALLY, AND LEFT RADIAL ARTERY HARVESTED OPEN (N/A) TRANSESOPHAGEAL ECHOCARDIOGRAM (TEE) (N/A) RADIAL ARTERY HARVEST (Left) ENDOVEIN HARVEST OF GREATER SAPHENOUS VEIN (Right) MAZE  1 afeb, VSS, sinus rhythm 2 sats good on RA 3 excellent UOP conts, creat trending higher- will see what nephrology says in terms of HD 4 H/H pretty stable 5 echo now 40-45 % , grade 2  Diastolic dysf- AHF team pleased with status, medication management per them- very stable on current 6 SNF bed search per SW 7 cont therapies  LOS: 22 days    Ayden Giovanni PA-C Pager [phone] 07/29/2021

## 2021-07-29 NOTE — Progress Notes (Signed)
Patient ID: Billy [name], male   DOB: 1965-11-12, 55 y.o.   MRN: [id number]     Advanced Heart Failure Rounding Note  PCP-Cardiologist: None   Subjective:    10/25: S/P CABG x4 with MAZE . Intra Op EF 20%.  10/27: Intubated. Hypotensive. Placed on Norepi + Vaso. Recurrent A fib with amio increased to 60 mg/hr.  10/29: Co-ox low, DBA added 11/1: Extubated. AF with RVR, amio gtt restarted 11/03: Dobutamine off 11/6: Started iHD  11/8: R IJ tunneled HD cath placed 11/14: Echo with EF 40-45%, mild LVH, mildly decreased RV systolic function, normal IVC.   Still with good UOP, creatinine mildly higher 3.07 => 3.23.   Last HD session 11/10  Received 1 U PRCs 11/09 for Hgb  7.7>>8.9>>7.9.   Feels good today. Denies dizziness. SBP 120s-130s.    Objective:   Weight Range: 109 kg Body mass index is 30.85 kg/m.   Vital Signs:   Temp:  [98 F (36.7 C)-98.7 F (37.1 C)] 98 F (36.7 C) (11/15 0439) Pulse Rate:  [77-82] 82 (11/15 0726) Resp:  [16-20] 20 (11/15 0439) BP: (103-138)/(64-85) 138/85 (11/15 0656) SpO2:  [9 %-98 %] 9 % (11/15 0726) Weight:  [109 kg] 109 kg (11/15 0439) Last BM Date: 07/28/21  Weight change: Filed Weights   07/27/21 0434 07/27/21 2324 07/29/21 0439  Weight: 107.7 kg 108.5 kg 109 kg    Intake/Output:   Intake/Output Summary (Last 24 hours) at 07/29/2021 0924 Last data filed at 07/29/2021 0500 Gross per 24 hour  Intake 714 ml  Output 2175 ml  Net -1461 ml      Physical Exam  General: NAD Neck: No JVD, no thyromegaly or thyroid nodule.  Lungs: Clear to auscultation bilaterally with normal respiratory effort. CV: Nondisplaced PMI.  Heart regular S1/S2, no S3/S4, no murmur.  No peripheral edema.   Abdomen: Soft, nontender, no hepatosplenomegaly, no distention.  Skin: Intact without lesions or rashes.  Neurologic: Alert and oriented x 3.  Psych: Normal affect. Extremities: No clubbing or cyanosis.  HEENT: Normal.    Telemetry   NSR 70s  (personally reviewed)  Labs    CBC Recent Labs    07/28/21 0740 07/29/21 0101  WBC 6.9 5.4  HGB 8.9* 7.9*  HCT 28.1* 24.4*  MCV 97.6 96.1  PLT 440* 314   Basic Metabolic Panel Recent Labs    07/28/21 0334 07/29/21 0101  NA 137 137  K 4.3 3.8  CL 103 102  CO2 26 26  GLUCOSE 124* 161*  BUN 38* 48*  CREATININE 3.07* 3.23*  CALCIUM 8.1* 8.2*  MG 1.8 2.2  PHOS 3.8 4.3   Liver Function Tests Recent Labs    07/28/21 0334 07/29/21 0101  ALBUMIN 2.3* 2.5*   No results for input(s): LIPASE, AMYLASE in the last 72 hours. Cardiac Enzymes No results for input(s): CKTOTAL, CKMB, CKMBINDEX, TROPONINI in the last 72 hours.  BNP: BNP (last 3 results) No results for input(s): BNP in the last 8760 hours.  ProBNP (last 3 results) No results for input(s): PROBNP in the last 8760 hours.   D-Dimer No results for input(s): DDIMER in the last 72 hours. Hemoglobin A1C No results for input(s): HGBA1C in the last 72 hours.  Fasting Lipid Panel No results for input(s): CHOL, HDL, LDLCALC, TRIG, CHOLHDL, LDLDIRECT in the last 72 hours.   Thyroid Function Tests No results for input(s): TSH, T4TOTAL, T3FREE, THYROIDAB in the last 72 hours.  Invalid input(s): FREET3  Other results:  Imaging    ECHOCARDIOGRAM COMPLETE  Result Date: 07/28/2021    ECHOCARDIOGRAM REPORT   Patient Name:   Billy [name] Date of Exam: 07/28/2021 Medical Rec #:  [id number]      Height:       74.0 in Accession #:    [id number]     Weight:       239.2 lb Date of Birth:  13-Apr-1966      BSA:          2.345 m Patient Age:    72 years       BP:           122/77 mmHg Patient Gender: M              HR:           82 bpm. Exam Location:  Inpatient Procedure: 2D Echo, Cardiac Doppler and Color Doppler Indications:    CHF-Acute Systolic O67.67  History:        Patient has prior history of Echocardiogram examinations, most                 recent 07/18/2021. Risk Factors:Diabetes.  Sonographer:    Bernadene Person  RDCS Referring Phys: [place]  1. Left ventricular ejection fraction, by estimation, is 40 to 45%. The left ventricle has mildly decreased function. The left ventricle demonstrates global hypokinesis. There is mild concentric left ventricular hypertrophy. Left ventricular diastolic parameters are consistent with Grade II diastolic dysfunction (pseudonormalization).  2. Right ventricular systolic function is mildly reduced. The right ventricular size is not well visualized. There is normal pulmonary artery systolic pressure.  3. Left atrial size was mildly dilated.  4. The mitral valve is normal in structure. Trivial mitral valve regurgitation. No evidence of mitral stenosis.  5. The aortic valve is normal in structure. Aortic valve regurgitation is not visualized. Aortic valve sclerosis is present, with no evidence of aortic valve stenosis.  6. Aortic dilatation noted. There is mild dilatation of the ascending aorta, measuring 39 mm.  7. The inferior vena cava is normal in size with greater than 50% respiratory variability, suggesting right atrial pressure of 3 mmHg. FINDINGS  Left Ventricle: Left ventricular ejection fraction, by estimation, is 40 to 45%. The left ventricle has mildly decreased function. The left ventricle demonstrates global hypokinesis. The left ventricular internal cavity size was normal in size. There is  mild concentric left ventricular hypertrophy. Left ventricular diastolic parameters are consistent with Grade II diastolic dysfunction (pseudonormalization). Right Ventricle: The right ventricular size is not well visualized. No increase in right ventricular wall thickness. Right ventricular systolic function is mildly reduced. There is normal pulmonary artery systolic pressure. The tricuspid regurgitant velocity is 2.41 m/s, and with an assumed right atrial pressure of 3 mmHg, the estimated right ventricular systolic pressure is 20.9 mmHg. Left Atrium: Left atrial size  was mildly dilated. Right Atrium: Right atrial size was normal in size. Pericardium: There is no evidence of pericardial effusion. Mitral Valve: The mitral valve is normal in structure. Mild mitral annular calcification. Trivial mitral valve regurgitation. No evidence of mitral valve stenosis. Tricuspid Valve: The tricuspid valve is normal in structure. Tricuspid valve regurgitation is not demonstrated. No evidence of tricuspid stenosis. Aortic Valve: The aortic valve is normal in structure. Aortic valve regurgitation is not visualized. Aortic valve sclerosis is present, with no evidence of aortic valve stenosis. Pulmonic Valve: The pulmonic valve was not well visualized. Pulmonic valve regurgitation is not visualized.  No evidence of pulmonic stenosis. Aorta: Aortic dilatation noted. There is mild dilatation of the ascending aorta, measuring 39 mm. Venous: The inferior vena cava is normal in size with greater than 50% respiratory variability, suggesting right atrial pressure of 3 mmHg. IAS/Shunts: No atrial level shunt detected by color flow Doppler.  LEFT VENTRICLE PLAX 2D LVIDd:         5.10 cm      Diastology LVIDs:         4.30 cm      LV e' medial:    7.18 cm/s LV PW:         1.20 cm      LV E/e' medial:  18.2 LV IVS:        1.30 cm      LV e' lateral:   11.30 cm/s LVOT diam:     2.10 cm      LV E/e' lateral: 11.6 LV SV:         60 LV SV Index:   25 LVOT Area:     3.46 cm  LV Volumes (MOD) LV vol d, MOD A2C: 181.0 ml LV vol d, MOD A4C: 176.0 ml LV vol s, MOD A2C: 111.0 ml LV vol s, MOD A4C: 99.8 ml LV SV MOD A2C:     70.0 ml LV SV MOD A4C:     176.0 ml LV SV MOD BP:      76.8 ml RIGHT VENTRICLE RV S prime:     8.59 cm/s TAPSE (M-mode): 1.3 cm LEFT ATRIUM             Index        RIGHT ATRIUM           Index LA diam:        3.70 cm 1.58 cm/m   RA Area:     15.00 cm LA Vol (A2C):   73.2 ml 31.21 ml/m  RA Volume:   32.60 ml  13.90 ml/m LA Vol (A4C):   97.5 ml 41.57 ml/m LA Biplane Vol: 90.0 ml 38.38 ml/m   AORTIC VALVE LVOT Vmax:   89.90 cm/s LVOT Vmean:  59.900 cm/s LVOT VTI:    0.172 m  AORTA Ao Root diam: 3.90 cm Ao Asc diam:  3.90 cm MITRAL VALVE                TRICUSPID VALVE MV Area (PHT): 4.39 cm     TR Peak grad:   23.2 mmHg MV Decel Time: 173 msec     TR Vmax:        241.00 cm/s MV E velocity: 131.00 cm/s MV A velocity: 72.60 cm/s   SHUNTS MV E/A ratio:  1.80         Systemic VTI:  0.17 m                             Systemic Diam: 2.10 cm Kardie Tobb DO Electronically signed by Berniece Salines DO Signature Date/Time: 07/28/2021/7:15:39 PM    Final      Medications:     Scheduled Medications:  ALPRAZolam  1 mg Oral TID   amiodarone  200 mg Oral Daily   apixaban  5 mg Oral BID   atorvastatin  80 mg Oral Daily   carvedilol  3.125 mg Oral BID WC   chlorhexidine  15 mL Mouth Rinse BID   Chlorhexidine Gluconate Cloth  6 each Topical Q0600   clopidogrel  75 mg Oral Daily   darbepoetin (ARANESP) injection - NON-DIALYSIS  100 mcg Subcutaneous Q Mon-1800   feeding supplement (NEPRO CARB STEADY)  237 mL Oral BID BM   hydrALAZINE  12.5 mg Oral TID   insulin aspart  0-15 Units Subcutaneous TID WC   insulin detemir  20 Units Subcutaneous BID   isosorbide mononitrate  30 mg Oral Daily   mouth rinse  15 mL Mouth Rinse q12n4p   multivitamin  1 tablet Oral QHS   pantoprazole  40 mg Oral Daily   sertraline  50 mg Oral Daily   sodium chloride flush  3 mL Intravenous Q12H   tamsulosin  0.4 mg Oral QHS   traZODone  100 mg Oral QHS    Infusions:  promethazine (PHENERGAN) injection (IM or IVPB) Stopped (07/09/21 1628)    PRN Medications: acetaminophen, albuterol, docusate, lip balm, metoprolol tartrate, ondansetron (ZOFRAN) IV, oxyCODONE, polyethylene glycol, promethazine (PHENERGAN) injection (IM or IVPB), sennosides, simethicone    Patient Profile   Mr Biehn is a 77 year with a history of visually impaired, DM, HTN, depression, anxiety takes 2 mg xanax 3 times a day, SVT, CKD Stage II, PAF,  and CAD.Marland Kitchen   Anterior Stemi--> CABG--> cardiogenic shock.   Assessment/Plan   1. CAD: Admitted with anterolateral MI from occluded diagonal treated with DES.  Patient then had CABG with LIMA-LAD, SVG-D, SVG-PDA, and left radial-OM for complete revascularization on 10/25.  No chest pain.  - Now off ASA given need for Plavix and anticoagulation.  - Continue Plavix 75 daily x 6 months post-PCI.  - atorvastatin 80 mg daily.  - Amlodipine for radial harvest held with hypotension on pressors.    2. Atrial fibrillation: Paroxysmal, s/p Maze and appendage clipping.  He has been maintaining NSR for the last few days.  - Continue PO amio 200 mg daily  .  - Eliquis 5 mg bid  3. AKI on CKD stage 3: Baseline creatinine around 1.6, up to 5 post-op.  He has likely had multiple hits including contrast with initial cath/PCI and possible peri-operative hypotension.  Suspect ATN. Concerned about chances of renal recovery. Stopped CVVHD 11/4.  11/6 started iHD, 11/08 R IJ tunneled HD catheter placed. Last HD session 11/12.  Good UOP, creatinine trending slowly upwards.  Hopefully will plateau and we can avoid HD.   4. Acute systolic CHF: Ischemic cardiomyopathy.  Echo prior to this admission with EF 50-55% (pre-MI and CABG).  Limited echo on 10/28 showed minimal pericardial effusion, EF in 25% range. Intraoperative TEE with EF 20%. Off  pressors. Echo 11/14 showed EF improved to 40-45% with mildly decreased RV systolic function.  He is not volume overloaded on exam.  - Continue hydralazine 12.5 mg tid and transition from isordil to Imdur 30 mg daily. - Continue  coreg 3.125 mg BID - No ARN/SGLT2/spironolactone with AKI 5. Anemia: Had 1 unit pRBCs 11/02 + 1 unit 11/9. Hgb 7.3>>8.0>>7.7>>7.9. No obvious source of bleeding.  - Getting IV iron with HD and aranesp.  7. Acute hypoxemic respiratory failure: Suspect primarily pulmonary edema, intubated. Possible PNA with bibasilar lung opacities. Extubated, completed course  of cefepime, volume status now looks good. O2 stable on room air.   - Completed course of cefepime.  8. Thrombocytopenia: Resolved.  9. Right sided weakness: Normal head CT 11/03 10. Urinary Retention: Foley placed 11/7. Foley out 11/13. No difficulty urinating -Continue flomax  Plan for SNF once decision made on +/- HD.   Loralie Champagne 07/29/2021  9:24 AM

## 2021-07-29 NOTE — Progress Notes (Signed)
Nutrition Follow-up  DOCUMENTATION CODES:   Not applicable  INTERVENTION:  - Encourage PO intake  - Continue Nepro Shake po BID, each supplement provides 425 kcal and 19 grams protein  - Continue renal MVI daily  - Recommend diet liberalization from renal/carb modified to carb modified. MD agrees  - Provided education on Plate Method for diabetes control and importance of balanced nutrition for heart health  NUTRITION DIAGNOSIS:   Increased nutrient needs related to acute illness (AKI requiring CRRT) as evidenced by estimated needs.  On-going, pt no longer requiring CRRT  GOAL:   Patient will meet greater than or equal to 90% of their needs  Goal met; meeting needs via meals and nutrition supplementation  MONITOR:   PO intake, Supplement acceptance, Labs, Weight trends, Skin, I & O's  REASON FOR ASSESSMENT:   Ventilator, Consult Enteral/tube feeding initiation and management  ASSESSMENT:   55 year old male who presented to the ED on 10/24 with STEMI. PMH of atrial fibrillation, HTN, T2DM, depression, anxiety, CKD stage II.  10/24 - s/p LHC with PCI, found to have severe multivessel CAD 10/25 - s/p CABG x 4, Maze 10/26 - extubated 10/27 - intubated, CRRT start, trickle tube feeds started 10/30 - trickle tube feeds on hold due to concern for reflux of tube feeds 10/31 - Cortrak placed (tip post-pyloric), trickle tube feeds restarted, rectal tube placed 11/01 - extubated 11/03 - full liquid diet 11/04 - regular diet, Cortrak pulled back to 88 cm, tube feeding rate halved, CRRT stop 11/05 - Cortrak removed, first iHD 11/07 - transferred out of ICU 11/08 - s/p tunneled HD catheter placement  Last HD on 11/12 with no post-HD assessment to indicate if any UF.  Noted meal completions of 100%. Pt reports eating well and having a good appetite. He states that he is trying to eat and drink all of his Nepro despite it being "a lot of food" but understands the importance  of adequate nutrition for overall health and well-being as well as muscle preservation and healing. He states that what he is eating is much different than his home meals and addressed all of pt's questions about how to eat after d/c.   Pt states that he has been trying to "cure his diabetes" and has been doing fruit and herbal tea cleanses. Provided pt with education on Plate Method for blood sugar control and low sodium/saturated fat/fiber rich diet for heart health. Encouraged pt to speak with PCP after d/c about appropriate MVI to continue with as they will monitor labs for necessary deficiencies. Pt would like attachments in AVS on Plate Method and Heart Healthy diet.  Spoke with MD about diet liberalization from renal/carb modified to carb modified to encourage more mealtime options. MD agreed and continue with 1253m fluid restriction.  Admit weight: 115.7 kg Current weight: 109 kg  Medications: SSI, levemir 20 units BID, rena-vit, protonix  Labs: BUN 48, Cr 3.23, CBGs: 81-190 x24 hours UOP: [id number] hours  Diet Order:   Diet Order             Diet Carb Modified Fluid consistency: Thin; Room service appropriate? Yes; Fluid restriction: 1200 mL Fluid  Diet effective now                  EDUCATION NEEDS:   Education needs have been addressed  Skin:  Skin Assessment: Skin Integrity Issues: Skin Integrity Issues:: Stage II, Incisions Stage II: R buttocks Incisions: chest, L leg, R arm  Last  BM:  07/28/21  Height:   Ht Readings from Last 1 Encounters:  07/14/21 '6\' 2"'  (1.88 m)    Weight:   Wt Readings from Last 1 Encounters:  07/29/21 109 kg    Ideal Body Weight:  86.4 kg  BMI:  Body mass index is 30.85 kg/m.  Estimated Nutritional Needs:   Kcal:  2200-2400  Protein:  110-120g  Fluid:  >2.2L  Clayborne Dana, RDN, LDN Clinical Nutrition

## 2021-07-29 NOTE — Progress Notes (Signed)
Mobility Specialist Progress Note:   07/29/21 1004  Mobility  Activity Ambulated in hall  Level of Assistance Standby assist, set-up cues, supervision of patient - no hands on  Assistive Device None  Distance Ambulated (ft) 850 ft  Mobility Ambulated with assistance in hallway  Mobility Response Tolerated well  Mobility performed by Mobility specialist  $Mobility charge 1 Mobility   Pre- Mobility:  81 HR;  132/90 BP During Mobility: 93 HR Post Mobility:   84 HR;  118/73 BP  Pt received in bed willing to participate in mobility. No complaints of pain. Required 2 standing rest breaks. Returned to EOB with call bell in reach and all needs met.   [facility] Health and safety inspector Phone [phone]

## 2021-07-29 NOTE — Progress Notes (Signed)
CARDIAC REHAB PHASE I   PRE:  Rate/Rhythm: 80 SR    BP: sitting 147/88    SaO2:   MODE:  Ambulation: 800 ft   POST:  Rate/Rhythm: 95 SR    BP: sitting 141/81     SaO2:   Pt ambulated without rollator or AD. Swayed at times but no LOB or assist. Rest x2 for DOE. BP elevated this pm. Tolerated well.  [place], ACSM 07/29/2021 3:02 PM

## 2021-07-29 NOTE — Progress Notes (Signed)
Patient ID: Billy [name], male   DOB: 02/12/1966, 55 y.o.   MRN: [id number] S: Feels well, no new complaints. O:BP 138/85   Pulse 82   Temp 98 F (36.7 C) (Oral)   Resp 20   Ht 6\' 2"  (1.88 m)   Wt 109 kg   SpO2 (!) 9%   BMI 30.85 kg/m   Intake/Output Summary (Last 24 hours) at 07/29/2021 1020 Last data filed at 07/29/2021 0500 Gross per 24 hour  Intake 714 ml  Output 2175 ml  Net -1461 ml   Intake/Output: I/O last 3 completed shifts: In: 777 [P.O.:300; I.V.:3; NG/GT:474] Out: 3675 [Urine:3675]  Intake/Output this shift:  No intake/output data recorded. Weight change: 0.5 kg Gen: NAD CVS: RRR Resp:CTA Abd: +BS, soft, NT/ND Ext: trace pretibial edema  Recent Labs  Lab 07/23/21 0340 07/24/21 0223 07/25/21 0115 07/26/21 0057 07/27/21 0125 07/28/21 0334 07/29/21 0101  NA 137 136 135 134* 134* 137 137  K 3.7 3.8 3.9 3.8 3.6 4.3 3.8  CL 101 102 99 100 97* 103 102  CO2 26 26 26 25 28 26 26   GLUCOSE 131* 138* 124* 131* 151* 124* 161*  BUN 54* 66* 36* 48* 25* 38* 48*  CREATININE 4.53* 4.79* 3.39* 4.01* 2.54* 3.07* 3.23*  ALBUMIN 2.2* 2.3* 2.3* 2.3* 2.3* 2.3* 2.5*  CALCIUM 8.0* 8.0* 7.8* 7.9* 7.7* 8.1* 8.2*  PHOS 5.0* 4.8* 4.3 4.2 3.2 3.8 4.3   Liver Function Tests: Recent Labs  Lab 07/27/21 0125 07/28/21 0334 07/29/21 0101  ALBUMIN 2.3* 2.3* 2.5*   No results for input(s): LIPASE, AMYLASE in the last 168 hours. No results for input(s): AMMONIA in the last 168 hours. CBC: Recent Labs  Lab 07/25/21 0115 07/26/21 0057 07/27/21 0125 07/28/21 0740 07/29/21 0101  WBC 8.1 8.1 7.7 6.9 5.4  HGB 7.7* 8.1* 7.7* 8.9* 7.9*  HCT 23.3* 24.9* 23.7* 28.1* 24.4*  MCV 95.1 96.1 96.0 97.6 96.1  PLT 445* 390 388 440* 297   Cardiac Enzymes: No results for input(s): CKTOTAL, CKMB, CKMBINDEX, TROPONINI in the last 168 hours. CBG: Recent Labs  Lab 07/28/21 0620 07/28/21 1300 07/28/21 1702 07/28/21 2146 07/29/21 0637  GLUCAP 95 188* 188* 190* 81    Iron Studies:  No results for input(s): IRON, TIBC, TRANSFERRIN, FERRITIN in the last 72 hours. Studies/Results: ECHOCARDIOGRAM COMPLETE  Result Date: 07/28/2021    ECHOCARDIOGRAM REPORT   Patient Name:   Billy [name] Date of Exam: 07/28/2021 Medical Rec #:  [id number]      Height:       74.0 in Accession #:    [id number]     Weight:       239.2 lb Date of Birth:  02/04/1966      BSA:          2.345 m Patient Age:    61 years       BP:           122/77 mmHg Patient Gender: M              HR:           82 bpm. Exam Location:  Inpatient Procedure: 2D Echo, Cardiac Doppler and Color Doppler Indications:    CHF-Acute Systolic M38.17  History:        Patient has prior history of Echocardiogram examinations, most                 recent 07/18/2021. Risk Factors:Diabetes.  Sonographer:    Bernadene Person RDCS  Referring Phys: [place]  1. Left ventricular ejection fraction, by estimation, is 40 to 45%. The left ventricle has mildly decreased function. The left ventricle demonstrates global hypokinesis. There is mild concentric left ventricular hypertrophy. Left ventricular diastolic parameters are consistent with Grade II diastolic dysfunction (pseudonormalization).  2. Right ventricular systolic function is mildly reduced. The right ventricular size is not well visualized. There is normal pulmonary artery systolic pressure.  3. Left atrial size was mildly dilated.  4. The mitral valve is normal in structure. Trivial mitral valve regurgitation. No evidence of mitral stenosis.  5. The aortic valve is normal in structure. Aortic valve regurgitation is not visualized. Aortic valve sclerosis is present, with no evidence of aortic valve stenosis.  6. Aortic dilatation noted. There is mild dilatation of the ascending aorta, measuring 39 mm.  7. The inferior vena cava is normal in size with greater than 50% respiratory variability, suggesting right atrial pressure of 3 mmHg. FINDINGS  Left Ventricle: Left ventricular  ejection fraction, by estimation, is 40 to 45%. The left ventricle has mildly decreased function. The left ventricle demonstrates global hypokinesis. The left ventricular internal cavity size was normal in size. There is  mild concentric left ventricular hypertrophy. Left ventricular diastolic parameters are consistent with Grade II diastolic dysfunction (pseudonormalization). Right Ventricle: The right ventricular size is not well visualized. No increase in right ventricular wall thickness. Right ventricular systolic function is mildly reduced. There is normal pulmonary artery systolic pressure. The tricuspid regurgitant velocity is 2.41 m/s, and with an assumed right atrial pressure of 3 mmHg, the estimated right ventricular systolic pressure is 53.9 mmHg. Left Atrium: Left atrial size was mildly dilated. Right Atrium: Right atrial size was normal in size. Pericardium: There is no evidence of pericardial effusion. Mitral Valve: The mitral valve is normal in structure. Mild mitral annular calcification. Trivial mitral valve regurgitation. No evidence of mitral valve stenosis. Tricuspid Valve: The tricuspid valve is normal in structure. Tricuspid valve regurgitation is not demonstrated. No evidence of tricuspid stenosis. Aortic Valve: The aortic valve is normal in structure. Aortic valve regurgitation is not visualized. Aortic valve sclerosis is present, with no evidence of aortic valve stenosis. Pulmonic Valve: The pulmonic valve was not well visualized. Pulmonic valve regurgitation is not visualized. No evidence of pulmonic stenosis. Aorta: Aortic dilatation noted. There is mild dilatation of the ascending aorta, measuring 39 mm. Venous: The inferior vena cava is normal in size with greater than 50% respiratory variability, suggesting right atrial pressure of 3 mmHg. IAS/Shunts: No atrial level shunt detected by color flow Doppler.  LEFT VENTRICLE PLAX 2D LVIDd:         5.10 cm      Diastology LVIDs:         4.30  cm      LV e' medial:    7.18 cm/s LV PW:         1.20 cm      LV E/e' medial:  18.2 LV IVS:        1.30 cm      LV e' lateral:   11.30 cm/s LVOT diam:     2.10 cm      LV E/e' lateral: 11.6 LV SV:         60 LV SV Index:   25 LVOT Area:     3.46 cm  LV Volumes (MOD) LV vol d, MOD A2C: 181.0 ml LV vol d, MOD A4C: 176.0 ml LV vol s, MOD  A2C: 111.0 ml LV vol s, MOD A4C: 99.8 ml LV SV MOD A2C:     70.0 ml LV SV MOD A4C:     176.0 ml LV SV MOD BP:      76.8 ml RIGHT VENTRICLE RV S prime:     8.59 cm/s TAPSE (M-mode): 1.3 cm LEFT ATRIUM             Index        RIGHT ATRIUM           Index LA diam:        3.70 cm 1.58 cm/m   RA Area:     15.00 cm LA Vol (A2C):   73.2 ml 31.21 ml/m  RA Volume:   32.60 ml  13.90 ml/m LA Vol (A4C):   97.5 ml 41.57 ml/m LA Biplane Vol: 90.0 ml 38.38 ml/m  AORTIC VALVE LVOT Vmax:   89.90 cm/s LVOT Vmean:  59.900 cm/s LVOT VTI:    0.172 m  AORTA Ao Root diam: 3.90 cm Ao Asc diam:  3.90 cm MITRAL VALVE                TRICUSPID VALVE MV Area (PHT): 4.39 cm     TR Peak grad:   23.2 mmHg MV Decel Time: 173 msec     TR Vmax:        241.00 cm/s MV E velocity: 131.00 cm/s MV A velocity: 72.60 cm/s   SHUNTS MV E/A ratio:  1.80         Systemic VTI:  0.17 m                             Systemic Diam: 2.10 cm Kardie Tobb DO Electronically signed by Berniece Salines DO Signature Date/Time: 07/28/2021/7:15:39 PM    Final     ALPRAZolam  1 mg Oral TID   amiodarone  200 mg Oral Daily   apixaban  5 mg Oral BID   atorvastatin  80 mg Oral Daily   carvedilol  3.125 mg Oral BID WC   chlorhexidine  15 mL Mouth Rinse BID   Chlorhexidine Gluconate Cloth  6 each Topical Q0600   clopidogrel  75 mg Oral Daily   darbepoetin (ARANESP) injection - NON-DIALYSIS  100 mcg Subcutaneous Q Mon-1800   feeding supplement (NEPRO CARB STEADY)  237 mL Oral BID BM   hydrALAZINE  12.5 mg Oral TID   insulin aspart  0-15 Units Subcutaneous TID WC   insulin detemir  20 Units Subcutaneous BID   isosorbide mononitrate  30  mg Oral Daily   mouth rinse  15 mL Mouth Rinse q12n4p   multivitamin  1 tablet Oral QHS   pantoprazole  40 mg Oral Daily   sertraline  50 mg Oral Daily   sodium chloride flush  3 mL Intravenous Q12H   tamsulosin  0.4 mg Oral QHS   traZODone  100 mg Oral QHS    BMET    Component Value Date/Time   NA 137 07/29/2021 0101   K 3.8 07/29/2021 0101   CL 102 07/29/2021 0101   CO2 26 07/29/2021 0101   GLUCOSE 161 (H) 07/29/2021 0101   BUN 48 (H) 07/29/2021 0101   BUN 31 02/26/2016 0000   CREATININE 3.23 (H) 07/29/2021 0101   CREATININE 1.02 09/09/2015 1710   CALCIUM 8.2 (L) 07/29/2021 0101   GFRNONAA 22 (L) 07/29/2021 0101   GFRAA >60 07/07/2017 1856  CBC    Component Value Date/Time   WBC 5.4 07/29/2021 0101   RBC 2.54 (L) 07/29/2021 0101   HGB 7.9 (L) 07/29/2021 0101   HCT 24.4 (L) 07/29/2021 0101   PLT 297 07/29/2021 0101   MCV 96.1 07/29/2021 0101   MCH 31.1 07/29/2021 0101   MCHC 32.4 07/29/2021 0101   RDW 14.0 07/29/2021 0101   LYMPHSABS 1.1 07/16/2021 0415   MONOABS 0.5 07/16/2021 0415   EOSABS 0.0 07/16/2021 0415   BASOSABS 0.0 07/16/2021 0415    Assessment/Plan:   AKI/CKD stage IIIb - presumably ischemic ATN in setting of hemodynamic instability post CABG +/- cholesterol emboli.  Started on CRRT 10/27 due to anuria and volume overload and transitioned to iHD on 11/6, RIJ TDC placed 07/22/21.   Last HD session 07/26/21 but now with increased UOP and slower rise of Scr between HD sessions.   Will hold off on further HD for now and continue to monitor for renal recovery.  No uremic symptoms or volume overload. Scr was 1.9 prior to CABG (followed by Dr. Moshe Cipro) CAD s/p CABG- per primary ABLA - low iron s/p IV iron.  On Aranesp. Continue to follow. A fib - on amiodarone and anticoagulation per primary. Urinary retention - s/p foley catheter removal.  Good UOP and continue to follow.   Donetta Potts, MD [name] [phone]

## 2021-07-29 NOTE — Plan of Care (Signed)

## 2021-07-30 DIAGNOSIS — N179 Acute kidney failure, unspecified: Secondary | ICD-10-CM | POA: Diagnosis not present

## 2021-07-30 LAB — RENAL FUNCTION PANEL
Albumin: 2.5 g/dL — ABNORMAL LOW (ref 3.5–5.0)
Anion gap: 8 (ref 5–15)
BUN: 48 mg/dL — ABNORMAL HIGH (ref 6–20)
CO2: 28 mmol/L (ref 22–32)
Calcium: 8.4 mg/dL — ABNORMAL LOW (ref 8.9–10.3)
Chloride: 104 mmol/L (ref 98–111)
Creatinine, Ser: 3.04 mg/dL — ABNORMAL HIGH (ref 0.61–1.24)
GFR, Estimated: 23 mL/min — ABNORMAL LOW (ref >60)
Glucose, Bld: 118 mg/dL — ABNORMAL HIGH (ref 70–99)
Phosphorus: 4.8 mg/dL — ABNORMAL HIGH (ref 2.5–4.6)
Potassium: 4 mmol/L (ref 3.5–5.1)
Sodium: 140 mmol/L (ref 135–145)

## 2021-07-30 LAB — CBC
HCT: 24.9 % — ABNORMAL LOW (ref 39.0–52.0)
Hemoglobin: 8 g/dL — ABNORMAL LOW (ref 13.0–17.0)
MCH: 31 pg (ref 26.0–34.0)
MCHC: 32.1 g/dL (ref 30.0–36.0)
MCV: 96.5 fL (ref 80.0–100.0)
Platelets: 284 10*3/uL (ref 150–400)
RBC: 2.58 MIL/uL — ABNORMAL LOW (ref 4.22–5.81)
RDW: 14 % (ref 11.5–15.5)
WBC: 5.4 10*3/uL (ref 4.0–10.5)
nRBC: 0 % (ref 0.0–0.2)

## 2021-07-30 LAB — GLUCOSE, CAPILLARY
Glucose-Capillary: 97 mg/dL (ref 70–99)
Glucose-Capillary: 152 mg/dL — ABNORMAL HIGH (ref 70–99)
Glucose-Capillary: 232 mg/dL — ABNORMAL HIGH (ref 70–99)
Glucose-Capillary: 146 mg/dL — ABNORMAL HIGH (ref 70–99)

## 2021-07-30 LAB — MAGNESIUM: Magnesium: 2 mg/dL (ref 1.7–2.4)

## 2021-07-30 NOTE — Plan of Care (Signed)

## 2021-07-30 NOTE — Progress Notes (Signed)
Occupational Therapy Treatment Patient Details Name: Billy [name] MRN: [id number] DOB: 1965-10-06 Today's Date: 07/30/2021   History of present illness Pt is a 55 y.o. male admitted 07/07/21 with STEMI. S/p LHC with PCA. S/p CABGx4 with MAZE 10/25. Extubated post-op on 10/26; reintubated 10/27-11/1. On CRRT 10/27-11/4. iHD initiated 11/6. S/p R IJ tunneled HD cath placement 11/8. PMH includes visual impairment, afib, HTN, DM, R toe amputation, neuropathy, anxiety.   OT comments  Performed ADL with supervision, no AD. Continues to want to go to SNF while sisters prepare his home for his return.    Recommendations for follow up therapy are one component of a multi-disciplinary discharge planning process, led by the attending physician.  Recommendations may be updated based on patient status, additional functional criteria and insurance authorization.    Follow Up Recommendations  Skilled nursing-short term rehab (<3 hours/day)    Assistance Recommended at Discharge Intermittent Supervision/Assistance  Equipment Recommendations  None recommended by OT    Recommendations for Other Services      Precautions / Restrictions Precautions Precautions: Fall;Other (comment);Sternal Precaution Booklet Issued: Yes (comment) Precaution Comments: reinforced sternal precautions related to IADL Restrictions Weight Bearing Restrictions: No Other Position/Activity Restrictions: sternal precautions       Mobility Bed Mobility Overal bed mobility: Modified Independent Bed Mobility: Rolling;Sidelying to Sit Rolling: Modified independent (Device/Increase time)              Transfers Overall transfer level: Needs assistance Equipment used: None Transfers: Sit to/from Stand Sit to Stand: Supervision                 Balance Overall balance assessment: Mild deficits observed, not formally tested Sitting-balance support: No upper extremity supported;Feet supported Sitting  balance-Leahy Scale: Good     Standing balance support: No upper extremity supported;During functional activity Standing balance-Leahy Scale: Good                             ADL either performed or assessed with clinical judgement   ADL Overall ADL's : Needs assistance/impaired     Grooming: Oral care;Standing           Upper Body Dressing : Set up;Sitting Upper Body Dressing Details (indicate cue type and reason): puffer vest Lower Body Dressing: Set up;Sitting/lateral leans Lower Body Dressing Details (indicate cue type and reason): sneakers and socks Toilet Transfer: Supervision/safety;Comfort height toilet;Ambulation   Toileting- Clothing Manipulation and Hygiene: Supervision/safety;Sit to/from stand       Functional mobility during ADLs: Min guard      Extremity/Trunk Assessment              Vision       Perception     Praxis      Cognition Arousal/Alertness: Awake/alert Behavior During Therapy: WFL for tasks assessed/performed Overall Cognitive Status: Impaired/Different from baseline Area of Impairment: Memory                     Memory: Decreased short-term memory                    Exercises    Shoulder Instructions       General Comments     Pertinent Vitals/ Pain       Pain Assessment: No/denies pain Pain Score: 2  Pain Location: hips Pain Descriptors / Indicators: Aching Pain Intervention(s): Monitored during session  Home Living  Prior Functioning/Environment              Frequency  Min 2X/week        Progress Toward Goals  OT Goals(current goals can now be found in the care plan section)  Progress towards OT goals: Progressing toward goals  Acute Rehab OT Goals OT Goal Formulation: With patient Time For Goal Achievement: 08/13/21 Potential to Achieve Goals: Good ADL Goals Pt Will Perform Grooming: Independently Pt Will  Perform Upper Body Dressing: Independently Pt Will Perform Lower Body Dressing: Independently Pt Will Transfer to Toilet: Independently Pt Will Perform Toileting - Clothing Manipulation and hygiene: Independently Additional ADL Goal #1: Pt will perform generalize sternal precautions. Additional ADL Goal #2: Pt will generalize energy conservation strategies.  Plan Discharge plan remains appropriate    Co-evaluation                 AM-PAC OT "6 Clicks" Daily Activity     Outcome Measure   Help from another person eating meals?: None Help from another person taking care of personal grooming?: A Little Help from another person toileting, which includes using toliet, bedpan, or urinal?: A Little Help from another person bathing (including washing, rinsing, drying)?: A Little Help from another person to put on and taking off regular upper body clothing?: None Help from another person to put on and taking off regular lower body clothing?: A Little 6 Click Score: 20    End of Session Equipment Utilized During Treatment: Gait belt  OT Visit Diagnosis: Muscle weakness (generalized) (M62.81);Other symptoms and signs involving cognitive function;Pain   Activity Tolerance Patient tolerated treatment well   Patient Left in bed;with call bell/phone within reach   Nurse Communication          Time: 1000-1018 OT Time Calculation (min): 18 min  Charges: OT General Charges $OT Visit: 1 Visit OT Treatments $Self Care/Home Management : 8-22 mins  [name], OTR/L Acute Rehabilitation Services Pager: [phone] Office: [phone]   [name] 07/30/2021, 10:27 AM

## 2021-07-30 NOTE — Progress Notes (Signed)
CARDIAC REHAB PHASE I   PRE:  Rate/Rhythm: 83 SR    BP: sitting 103/61    SaO2: 94 RA  MODE:  Ambulation: 150 ft   POST:  Rate/Rhythm: 88 SR    BP: sitting 104/66     SaO2: 96 RA  Pt immediately dizzy upon standing. Needed to sit a few feet in hall, BP 89/59. Rested then stood again, BP 86/53. Pt wanted to try to walk and accomplished 150 ft however he felt dizzy and off balance and SOB. Shortened distance. BP 104/66 after walk on EOB. Encouraged pt to try again later.  [place], ACSM 07/30/2021 2:43 PM

## 2021-07-30 NOTE — Progress Notes (Signed)
Patient ID: Billy [name], male   DOB: [birth year], 55 y.o.   MRN: [id number] S: Feels well, no complaints.  O:BP 112/66 (BP Location: Right Arm)   Pulse 82   Temp 97.7 F (36.5 C) (Oral)   Resp 18   Ht 6\' 2"  (1.88 m)   Wt 107 kg   SpO2 96%   BMI 30.27 kg/m   Intake/Output Summary (Last 24 hours) at 07/30/2021 1001 Last data filed at 07/30/2021 0330 Gross per 24 hour  Intake 400 ml  Output 1575 ml  Net -1175 ml   Intake/Output: I/O last 3 completed shifts: In: 3235 [P.O.:1077] Out: 5732 [Urine:3250]  Intake/Output this shift:  No intake/output data recorded. Weight change: -2.042 kg Gen: NAD CVS: RRR Resp: CTA Abd: +BS, soft, NT/Nd Ext: minimal pretibial edema  Recent Labs  Lab 07/24/21 0223 07/25/21 0115 07/26/21 0057 07/27/21 0125 07/28/21 0334 07/29/21 0101 07/30/21 0046  NA 136 135 134* 134* 137 137 140  K 3.8 3.9 3.8 3.6 4.3 3.8 4.0  CL 102 99 100 97* 103 102 104  CO2 26 26 25 28 26 26 28   GLUCOSE 138* 124* 131* 151* 124* 161* 118*  BUN 66* 36* 48* 25* 38* 48* 48*  CREATININE 4.79* 3.39* 4.01* 2.54* 3.07* 3.23* 3.04*  ALBUMIN 2.3* 2.3* 2.3* 2.3* 2.3* 2.5* 2.5*  CALCIUM 8.0* 7.8* 7.9* 7.7* 8.1* 8.2* 8.4*  PHOS 4.8* 4.3 4.2 3.2 3.8 4.3 4.8*   Liver Function Tests: Recent Labs  Lab 07/28/21 0334 07/29/21 0101 07/30/21 0046  ALBUMIN 2.3* 2.5* 2.5*   No results for input(s): LIPASE, AMYLASE in the last 168 hours. No results for input(s): AMMONIA in the last 168 hours. CBC: Recent Labs  Lab 07/26/21 0057 07/27/21 0125 07/28/21 0740 07/29/21 0101 07/30/21 0046  WBC 8.1 7.7 6.9 5.4 5.4  HGB 8.1* 7.7* 8.9* 7.9* 8.0*  HCT 24.9* 23.7* 28.1* 24.4* 24.9*  MCV 96.1 96.0 97.6 96.1 96.5  PLT 390 388 440* 297 284   Cardiac Enzymes: No results for input(s): CKTOTAL, CKMB, CKMBINDEX, TROPONINI in the last 168 hours. CBG: Recent Labs  Lab 07/29/21 0637 07/29/21 1118 07/29/21 1633 07/29/21 2121 07/30/21 0644  GLUCAP 81 222* 142* 132* 97    Iron  Studies: No results for input(s): IRON, TIBC, TRANSFERRIN, FERRITIN in the last 72 hours. Studies/Results: ECHOCARDIOGRAM COMPLETE  Result Date: 07/28/2021    ECHOCARDIOGRAM REPORT   Patient Name:   Billy [name] Date of Exam: 07/28/2021 Medical Rec #:  [id number]      Height:       74.0 in Accession #:    [id number]     Weight:       239.2 lb Date of Birth:  October 12, 1965      BSA:          2.345 m Patient Age:    67 years       BP:           122/77 mmHg Patient Gender: M              HR:           82 bpm. Exam Location:  Inpatient Procedure: 2D Echo, Cardiac Doppler and Color Doppler Indications:    CHF-Acute Systolic V61.60  History:        Patient has prior history of Echocardiogram examinations, most                 recent 07/18/2021. Risk Factors:Diabetes.  Sonographer:    Judson Roch  Pirrotta RDCS Referring Phys: [place]  1. Left ventricular ejection fraction, by estimation, is 40 to 45%. The left ventricle has mildly decreased function. The left ventricle demonstrates global hypokinesis. There is mild concentric left ventricular hypertrophy. Left ventricular diastolic parameters are consistent with Grade II diastolic dysfunction (pseudonormalization).  2. Right ventricular systolic function is mildly reduced. The right ventricular size is not well visualized. There is normal pulmonary artery systolic pressure.  3. Left atrial size was mildly dilated.  4. The mitral valve is normal in structure. Trivial mitral valve regurgitation. No evidence of mitral stenosis.  5. The aortic valve is normal in structure. Aortic valve regurgitation is not visualized. Aortic valve sclerosis is present, with no evidence of aortic valve stenosis.  6. Aortic dilatation noted. There is mild dilatation of the ascending aorta, measuring 39 mm.  7. The inferior vena cava is normal in size with greater than 50% respiratory variability, suggesting right atrial pressure of 3 mmHg. FINDINGS  Left Ventricle: Left  ventricular ejection fraction, by estimation, is 40 to 45%. The left ventricle has mildly decreased function. The left ventricle demonstrates global hypokinesis. The left ventricular internal cavity size was normal in size. There is  mild concentric left ventricular hypertrophy. Left ventricular diastolic parameters are consistent with Grade II diastolic dysfunction (pseudonormalization). Right Ventricle: The right ventricular size is not well visualized. No increase in right ventricular wall thickness. Right ventricular systolic function is mildly reduced. There is normal pulmonary artery systolic pressure. The tricuspid regurgitant velocity is 2.41 m/s, and with an assumed right atrial pressure of 3 mmHg, the estimated right ventricular systolic pressure is 42.3 mmHg. Left Atrium: Left atrial size was mildly dilated. Right Atrium: Right atrial size was normal in size. Pericardium: There is no evidence of pericardial effusion. Mitral Valve: The mitral valve is normal in structure. Mild mitral annular calcification. Trivial mitral valve regurgitation. No evidence of mitral valve stenosis. Tricuspid Valve: The tricuspid valve is normal in structure. Tricuspid valve regurgitation is not demonstrated. No evidence of tricuspid stenosis. Aortic Valve: The aortic valve is normal in structure. Aortic valve regurgitation is not visualized. Aortic valve sclerosis is present, with no evidence of aortic valve stenosis. Pulmonic Valve: The pulmonic valve was not well visualized. Pulmonic valve regurgitation is not visualized. No evidence of pulmonic stenosis. Aorta: Aortic dilatation noted. There is mild dilatation of the ascending aorta, measuring 39 mm. Venous: The inferior vena cava is normal in size with greater than 50% respiratory variability, suggesting right atrial pressure of 3 mmHg. IAS/Shunts: No atrial level shunt detected by color flow Doppler.  LEFT VENTRICLE PLAX 2D LVIDd:         5.10 cm      Diastology LVIDs:          4.30 cm      LV e' medial:    7.18 cm/s LV PW:         1.20 cm      LV E/e' medial:  18.2 LV IVS:        1.30 cm      LV e' lateral:   11.30 cm/s LVOT diam:     2.10 cm      LV E/e' lateral: 11.6 LV SV:         60 LV SV Index:   25 LVOT Area:     3.46 cm  LV Volumes (MOD) LV vol d, MOD A2C: 181.0 ml LV vol d, MOD A4C: 176.0 ml LV vol  s, MOD A2C: 111.0 ml LV vol s, MOD A4C: 99.8 ml LV SV MOD A2C:     70.0 ml LV SV MOD A4C:     176.0 ml LV SV MOD BP:      76.8 ml RIGHT VENTRICLE RV S prime:     8.59 cm/s TAPSE (M-mode): 1.3 cm LEFT ATRIUM             Index        RIGHT ATRIUM           Index LA diam:        3.70 cm 1.58 cm/m   RA Area:     15.00 cm LA Vol (A2C):   73.2 ml 31.21 ml/m  RA Volume:   32.60 ml  13.90 ml/m LA Vol (A4C):   97.5 ml 41.57 ml/m LA Biplane Vol: 90.0 ml 38.38 ml/m  AORTIC VALVE LVOT Vmax:   89.90 cm/s LVOT Vmean:  59.900 cm/s LVOT VTI:    0.172 m  AORTA Ao Root diam: 3.90 cm Ao Asc diam:  3.90 cm MITRAL VALVE                TRICUSPID VALVE MV Area (PHT): 4.39 cm     TR Peak grad:   23.2 mmHg MV Decel Time: 173 msec     TR Vmax:        241.00 cm/s MV E velocity: 131.00 cm/s MV A velocity: 72.60 cm/s   SHUNTS MV E/A ratio:  1.80         Systemic VTI:  0.17 m                             Systemic Diam: 2.10 cm Kardie Tobb DO Electronically signed by Berniece Salines DO Signature Date/Time: 07/28/2021/7:15:39 PM    Final     ALPRAZolam  1 mg Oral TID   amiodarone  200 mg Oral Daily   apixaban  5 mg Oral BID   atorvastatin  80 mg Oral Daily   carvedilol  3.125 mg Oral BID WC   chlorhexidine  15 mL Mouth Rinse BID   Chlorhexidine Gluconate Cloth  6 each Topical Q0600   clopidogrel  75 mg Oral Daily   darbepoetin (ARANESP) injection - NON-DIALYSIS  100 mcg Subcutaneous Q Mon-1800   feeding supplement (NEPRO CARB STEADY)  237 mL Oral BID BM   hydrALAZINE  12.5 mg Oral TID   insulin aspart  0-15 Units Subcutaneous TID WC   insulin detemir  20 Units Subcutaneous BID   isosorbide  mononitrate  30 mg Oral Daily   mouth rinse  15 mL Mouth Rinse q12n4p   multivitamin  1 tablet Oral QHS   pantoprazole  40 mg Oral Daily   sertraline  50 mg Oral Daily   sodium chloride flush  3 mL Intravenous Q12H   tamsulosin  0.4 mg Oral QHS   traZODone  100 mg Oral QHS    BMET    Component Value Date/Time   NA 140 07/30/2021 0046   K 4.0 07/30/2021 0046   CL 104 07/30/2021 0046   CO2 28 07/30/2021 0046   GLUCOSE 118 (H) 07/30/2021 0046   BUN 48 (H) 07/30/2021 0046   BUN 31 02/26/2016 0000   CREATININE 3.04 (H) 07/30/2021 0046   CREATININE 1.02 09/09/2015 1710   CALCIUM 8.4 (L) 07/30/2021 0046   GFRNONAA 23 (L) 07/30/2021 0046   GFRAA >60 07/07/2017 3662  CBC    Component Value Date/Time   WBC 5.4 07/30/2021 0046   RBC 2.58 (L) 07/30/2021 0046   HGB 8.0 (L) 07/30/2021 0046   HCT 24.9 (L) 07/30/2021 0046   PLT 284 07/30/2021 0046   MCV 96.5 07/30/2021 0046   MCH 31.0 07/30/2021 0046   MCHC 32.1 07/30/2021 0046   RDW 14.0 07/30/2021 0046   LYMPHSABS 1.1 07/16/2021 0415   MONOABS 0.5 07/16/2021 0415   EOSABS 0.0 07/16/2021 0415   BASOSABS 0.0 07/16/2021 0415    Assessment/Plan:   AKI/CKD stage IIIb - presumably ischemic ATN in setting of hemodynamic instability post CABG +/- cholesterol emboli.  Started on CRRT 10/27 due to anuria and volume overload and transitioned to iHD on 11/6, RIJ TDC placed 07/22/21.   Last HD session 07/26/21 but now with increased UOP and finally improvement of BUN/Cr. Will continue to hold off on further HD for now and if Scr continues to improve, will d/c TDC.   No uremic symptoms or volume overload. Scr was 1.9 prior to CABG (followed by Dr. Moshe Cipro) CAD s/p CABG- per primary ABLA - low iron s/p IV iron.  On Aranesp. Continue to follow. A fib - on amiodarone and anticoagulation per primary. Urinary retention - s/p foley catheter removal.  Good UOP and continue to follow.   Donetta Potts, MD [employer] [phone]

## 2021-07-30 NOTE — Progress Notes (Signed)
Mobility Specialist Progress Note    07/30/21 1655  Mobility  Activity Ambulated in hall  Level of Assistance Modified independent, requires aide device or extra time  Assistive Device Four wheel walker;None  Distance Ambulated (ft) 820 ft  Mobility Ambulated independently in hallway  Mobility Response Tolerated well  Mobility performed by Mobility specialist  $Mobility charge 1 Mobility   Pre-Mobility: 61 HR, 108/72 BP Post-Mobility:55 HR, 117/79 BP  Pt received in bed and agreeable. C/o minimal dizziness that eventually subsided. Used rollator on first lap of unit then no AD on second lap. Took x2 short standing breaks on the first lap and x1 on the second lap. Returned to sitting EOB with call bell in reach.   Hildred Alamin Mobility Specialist  Mobility Specialist Phone: [phone]

## 2021-07-30 NOTE — Progress Notes (Signed)
Physical Therapy Treatment Patient Details Name: Billy [name] MRN: [id number] DOB: Aug 02, 1966 Today's Date: 07/30/2021   History of Present Illness Pt is a 55 y.o. male admitted 07/07/21 with STEMI. S/p LHC with PCA. S/p CABGx4 with MAZE 10/25. Extubated post-op on 10/26; reintubated 10/27-11/1. On CRRT 10/27-11/4. iHD initiated 11/6. S/p R IJ tunneled HD cath placement 11/8. PMH includes visual impairment, afib, HTN, DM, R toe amputation, neuropathy, anxiety.    PT Comments    Pt tolerates treatment well, ambulating for community distances at this time. Pt demonstrates good awareness of energy conservation, taking a standing rest break to improve work of breathing. PT discusses HEP for hip mobility. Pt continues to express concerns about discharge home, mainly stating his family needs increased time to move furniture in the home due to his vision impairment. From a PT standpoint the pt is mobilizing well enough to discharge home with PRN assistance from family for community distances. Pt continuing to express a strong desire to discharge to SNF.   Recommendations for follow up therapy are one component of a multi-disciplinary discharge planning process, led by the attending physician.  Recommendations may be updated based on patient status, additional functional criteria and insurance authorization.  Follow Up Recommendations  Skilled nursing-short term rehab (<3 hours/day)     Assistance Recommended at Discharge PRN  Equipment Recommendations  None recommended by PT    Recommendations for Other Services       Precautions / Restrictions Precautions Precautions: Fall;Other (comment);Sternal Precaution Booklet Issued: Yes (comment) Precaution Comments: reinforced sternal precautions related to IADL Restrictions Weight Bearing Restrictions: No Other Position/Activity Restrictions: sternal precautions     Mobility  Bed Mobility Overal bed mobility: Modified Independent Bed  Mobility: Rolling;Sidelying to Sit Rolling: Modified independent (Device/Increase time)              Transfers Overall transfer level: Needs assistance Equipment used: None Transfers: Sit to/from Stand Sit to Stand: Supervision                Ambulation/Gait Ambulation/Gait assistance: Supervision Gait Distance (Feet): 1000 Feet (one standing rest break) Assistive device: None Gait Pattern/deviations: Step-through pattern Gait velocity: functional Gait velocity interpretation: >2.62 ft/sec, indicative of community ambulatory   General Gait Details: pt with steady step-through gait   Stairs             Wheelchair Mobility    Modified Rankin (Stroke Patients Only)       Balance Overall balance assessment: Mild deficits observed, not formally tested Sitting-balance support: No upper extremity supported;Feet supported Sitting balance-Leahy Scale: Good     Standing balance support: No upper extremity supported;During functional activity Standing balance-Leahy Scale: Good                              Cognition Arousal/Alertness: Awake/alert Behavior During Therapy: WFL for tasks assessed/performed Overall Cognitive Status: Impaired/Different from baseline Area of Impairment: Memory                     Memory: Decreased short-term memory                  Exercises Other Exercises Other Exercises: standing hip abduction x 5 Other Exercises: standing hip extension stretch 1 rep both sides x 20 second hold    General Comments General comments (skin integrity, edema, etc.): VSS on RA, brief period of tachycardia to 130s however quickly recovers to low 100s  when ambulating. SpO2 stable on RA, BP stable 111/74      Pertinent Vitals/Pain Pain Assessment: No/denies pain Pain Score: 2  Pain Location: hips Pain Descriptors / Indicators: Aching Pain Intervention(s): Monitored during session    Home Living                           Prior Function            PT Goals (current goals can now be found in the care plan section) Acute Rehab PT Goals Patient Stated Goal: get home for Thanksgiving Progress towards PT goals: Progressing toward goals    Frequency    Min 3X/week      PT Plan Current plan remains appropriate    Co-evaluation              AM-PAC PT "6 Clicks" Mobility   Outcome Measure  Help needed turning from your back to your side while in a flat bed without using bedrails?: None Help needed moving from lying on your back to sitting on the side of a flat bed without using bedrails?: None Help needed moving to and from a bed to a chair (including a wheelchair)?: A Little Help needed standing up from a chair using your arms (e.g., wheelchair or bedside chair)?: A Little Help needed to walk in hospital room?: A Little Help needed climbing 3-5 steps with a railing? : A Little 6 Click Score: 20    End of Session   Activity Tolerance: Patient tolerated treatment well Patient left: in chair;with call bell/phone within reach Nurse Communication: Mobility status PT Visit Diagnosis: Other abnormalities of gait and mobility (R26.89);Muscle weakness (generalized) (M62.81);Unsteadiness on feet (R26.81)     Time: 4431-5400 PT Time Calculation (min) (ACUTE ONLY): 23 min  Charges:  $Gait Training: 23-37 mins                     Zenaida Niece, PT, DPT Acute Rehabilitation Pager: [phone] Office [place] 07/30/2021, 10:39 AM

## 2021-07-30 NOTE — TOC Progression Note (Addendum)
Transition of Care [facility]) - Progression Note    Patient Details  Name: Billy [name] MRN: [id number] Date of Birth: February 18, 1966  [facility] of [facility] CM/SW [place], [place] Phone Number: 07/30/2021, 9:34 AM  Clinical Narrative:    HF CSW received a call from the patient's sister Jill Side [phone] asking for an update regarding her brother and the discharge plan. CSW reached out to [facility] and they don't have any beds available. CSW outreached [place] to check on [employer] authorization however they didn't pick up the phone and CSW had to leave a voicemail for them to return the call. HF CSW returned the patient's sister call, Jill Side [phone] to update her about the discharge plan but she forwarded the CSW twice to voicemail and CSW left a message with the update. Pending if [name] will need outpatient HD or not. CSW heard back from [place] that the insurance authorization has expired and they will resubmit authorization.  CSW spoke with the patients sister, Jill Side [phone] and updated about the discharge plan. Arbie Cookey reported concerns about after [name] discharges from the SNF and what resources will be available to him. CSW informed Arbie Cookey about meals on wheels and the the CSW will reach out to [facility] and ask about [name] possibly getting meals on wheels. Arbie Cookey asked for any support to please send [name] way.  CSW heard back from [place] at [facility] who asked for the CSW to send updated clinicals over to resubmit the insurance authorization. CSW faxed updated clinicals to [place] [phone].  CSW will continue to follow throughout discharge.   Expected Discharge Plan: [place] Barriers to Discharge: Continued Medical Work up  Expected Discharge Plan and Services Expected Discharge Plan: [place] In-house Referral: Clinical Social Work Discharge Planning  Services: CM Consult Post Acute Care Choice: [place] Living arrangements for the past 2 months: Single Family Home                                       Social Determinants of Health (SDOH) Interventions Food Insecurity Interventions: Intervention Not Indicated Financial Strain Interventions: Intervention Not Indicated Housing Interventions: Intervention Not Indicated Transportation Interventions: Intervention Not Indicated, Other (Comment) (Pt is visually impaired but receives help from family with transportation needs.)  Readmission Risk Interventions No flowsheet data found.  Violett Hobbs, MSW, [place] Heart Failure Social Worker

## 2021-07-30 NOTE — Progress Notes (Addendum)
[address]       [place] [address]             [phone]      22 Days Post-Op Procedure(s) (LRB): CORONARY ARTERY BYPASS GRAFTING (CABG) TIMES FOUR USING LEFT INTERNAL MAMMARY ARTERY, GREATER SAPHENOUS VEIN HARVESTED ENDOSCOPICALLY, AND LEFT RADIAL ARTERY HARVESTED OPEN (N/A) TRANSESOPHAGEAL ECHOCARDIOGRAM (TEE) (N/A) RADIAL ARTERY HARVEST (Left) ENDOVEIN HARVEST OF GREATER SAPHENOUS VEIN (Right) MAZE Subjective: Dizziness better but still some, ambulation conts to improve  Objective: Vital signs in last 24 hours: Temp:  [97.8 F (36.6 C)-98 F (36.7 C)] 97.8 F (36.6 C) (11/16 0325) Pulse Rate:  [80-82] 80 (11/16 0325) Cardiac Rhythm: Normal sinus rhythm (11/15 2000) Resp:  [17-21] 21 (11/16 0325) BP: (110-132)/(62-84) 128/84 (11/16 0325) SpO2:  [95 %-98 %] 98 % (11/16 0325) Weight:  [107 kg] 107 kg (11/16 0420)  Hemodynamic parameters for last 24 hours:    Intake/Output from previous day: 11/15 0701 - 11/16 0700 In: 637 [P.O.:637] Out: 1575 [Urine:1575] Intake/Output this shift: No intake/output data recorded.  General appearance: alert, cooperative, and no distress Heart: regular rate and rhythm Lungs: clear to auscultation bilaterally Abdomen: benign Extremities: trace edema Wound: incis healing well  Lab Results: Recent Labs    07/29/21 0101 07/30/21 0046  WBC 5.4 5.4  HGB 7.9* 8.0*  HCT 24.4* 24.9*  PLT 297 284   BMET:  Recent Labs    07/29/21 0101 07/30/21 0046  NA 137 140  K 3.8 4.0  CL 102 104  CO2 26 28  GLUCOSE 161* 118*  BUN 48* 48*  CREATININE 3.23* 3.04*  CALCIUM 8.2* 8.4*    PT/INR: No results for input(s): LABPROT, INR in the last 72 hours. ABG    Component Value Date/Time   PHART 7.459 (H) 07/16/2021 1330   HCO3 24.8 07/16/2021 1330   TCO2 26 07/16/2021 1330   ACIDBASEDEF 9.0 (H) 07/10/2021 0303   O2SAT 38.3 07/23/2021 1447   CBG (last 3)  Recent Labs    07/29/21 1633 07/29/21 2121  07/30/21 0644  GLUCAP 142* 132* 97    Meds Scheduled Meds:  ALPRAZolam  1 mg Oral TID   amiodarone  200 mg Oral Daily   apixaban  5 mg Oral BID   atorvastatin  80 mg Oral Daily   carvedilol  3.125 mg Oral BID WC   chlorhexidine  15 mL Mouth Rinse BID   Chlorhexidine Gluconate Cloth  6 each Topical Q0600   clopidogrel  75 mg Oral Daily   darbepoetin (ARANESP) injection - NON-DIALYSIS  100 mcg Subcutaneous Q Mon-1800   feeding supplement (NEPRO CARB STEADY)  237 mL Oral BID BM   hydrALAZINE  12.5 mg Oral TID   insulin aspart  0-15 Units Subcutaneous TID WC   insulin detemir  20 Units Subcutaneous BID   isosorbide mononitrate  30 mg Oral Daily   mouth rinse  15 mL Mouth Rinse q12n4p   multivitamin  1 tablet Oral QHS   pantoprazole  40 mg Oral Daily   sertraline  50 mg Oral Daily   sodium chloride flush  3 mL Intravenous Q12H   tamsulosin  0.4 mg Oral QHS   traZODone  100 mg Oral QHS   Continuous Infusions:  promethazine (PHENERGAN) injection (IM or IVPB) Stopped (07/09/21 1628)   PRN Meds:.acetaminophen, albuterol, docusate, lip balm, metoprolol tartrate, ondansetron (ZOFRAN) IV, oxyCODONE, polyethylene glycol, promethazine (PHENERGAN) injection (IM or IVPB), sennosides, simethicone  Xrays ECHOCARDIOGRAM  COMPLETE  Result Date: 07/28/2021    ECHOCARDIOGRAM REPORT   Patient Name:   Billy [name] Date of Exam: 07/28/2021 Medical Rec #:  [id number]      Height:       74.0 in Accession #:    [id number]     Weight:       239.2 lb Date of Birth:  10/24/1965      BSA:          2.345 m Patient Age:    55 years       BP:           122/77 mmHg Patient Gender: M              HR:           82 bpm. Exam Location:  Inpatient Procedure: 2D Echo, Cardiac Doppler and Color Doppler Indications:    CHF-Acute Systolic G25.63  History:        Patient has prior history of Echocardiogram examinations, most                 recent 07/18/2021. Risk Factors:Diabetes.  Sonographer:    Bernadene Person RDCS  Referring Phys: [place]  1. Left ventricular ejection fraction, by estimation, is 40 to 45%. The left ventricle has mildly decreased function. The left ventricle demonstrates global hypokinesis. There is mild concentric left ventricular hypertrophy. Left ventricular diastolic parameters are consistent with Grade II diastolic dysfunction (pseudonormalization).  2. Right ventricular systolic function is mildly reduced. The right ventricular size is not well visualized. There is normal pulmonary artery systolic pressure.  3. Left atrial size was mildly dilated.  4. The mitral valve is normal in structure. Trivial mitral valve regurgitation. No evidence of mitral stenosis.  5. The aortic valve is normal in structure. Aortic valve regurgitation is not visualized. Aortic valve sclerosis is present, with no evidence of aortic valve stenosis.  6. Aortic dilatation noted. There is mild dilatation of the ascending aorta, measuring 39 mm.  7. The inferior vena cava is normal in size with greater than 50% respiratory variability, suggesting right atrial pressure of 3 mmHg. FINDINGS  Left Ventricle: Left ventricular ejection fraction, by estimation, is 40 to 45%. The left ventricle has mildly decreased function. The left ventricle demonstrates global hypokinesis. The left ventricular internal cavity size was normal in size. There is  mild concentric left ventricular hypertrophy. Left ventricular diastolic parameters are consistent with Grade II diastolic dysfunction (pseudonormalization). Right Ventricle: The right ventricular size is not well visualized. No increase in right ventricular wall thickness. Right ventricular systolic function is mildly reduced. There is normal pulmonary artery systolic pressure. The tricuspid regurgitant velocity is 2.41 m/s, and with an assumed right atrial pressure of 3 mmHg, the estimated right ventricular systolic pressure is 89.3 mmHg. Left Atrium: Left atrial size was  mildly dilated. Right Atrium: Right atrial size was normal in size. Pericardium: There is no evidence of pericardial effusion. Mitral Valve: The mitral valve is normal in structure. Mild mitral annular calcification. Trivial mitral valve regurgitation. No evidence of mitral valve stenosis. Tricuspid Valve: The tricuspid valve is normal in structure. Tricuspid valve regurgitation is not demonstrated. No evidence of tricuspid stenosis. Aortic Valve: The aortic valve is normal in structure. Aortic valve regurgitation is not visualized. Aortic valve sclerosis is present, with no evidence of aortic valve stenosis. Pulmonic Valve: The pulmonic valve was not well visualized. Pulmonic valve regurgitation is not visualized. No evidence of pulmonic stenosis.  Aorta: Aortic dilatation noted. There is mild dilatation of the ascending aorta, measuring 39 mm. Venous: The inferior vena cava is normal in size with greater than 50% respiratory variability, suggesting right atrial pressure of 3 mmHg. IAS/Shunts: No atrial level shunt detected by color flow Doppler.  LEFT VENTRICLE PLAX 2D LVIDd:         5.10 cm      Diastology LVIDs:         4.30 cm      LV e' medial:    7.18 cm/s LV PW:         1.20 cm      LV E/e' medial:  18.2 LV IVS:        1.30 cm      LV e' lateral:   11.30 cm/s LVOT diam:     2.10 cm      LV E/e' lateral: 11.6 LV SV:         60 LV SV Index:   25 LVOT Area:     3.46 cm  LV Volumes (MOD) LV vol d, MOD A2C: 181.0 ml LV vol d, MOD A4C: 176.0 ml LV vol s, MOD A2C: 111.0 ml LV vol s, MOD A4C: 99.8 ml LV SV MOD A2C:     70.0 ml LV SV MOD A4C:     176.0 ml LV SV MOD BP:      76.8 ml RIGHT VENTRICLE RV S prime:     8.59 cm/s TAPSE (M-mode): 1.3 cm LEFT ATRIUM             Index        RIGHT ATRIUM           Index LA diam:        3.70 cm 1.58 cm/m   RA Area:     15.00 cm LA Vol (A2C):   73.2 ml 31.21 ml/m  RA Volume:   32.60 ml  13.90 ml/m LA Vol (A4C):   97.5 ml 41.57 ml/m LA Biplane Vol: 90.0 ml 38.38 ml/m   AORTIC VALVE LVOT Vmax:   89.90 cm/s LVOT Vmean:  59.900 cm/s LVOT VTI:    0.172 m  AORTA Ao Root diam: 3.90 cm Ao Asc diam:  3.90 cm MITRAL VALVE                TRICUSPID VALVE MV Area (PHT): 4.39 cm     TR Peak grad:   23.2 mmHg MV Decel Time: 173 msec     TR Vmax:        241.00 cm/s MV E velocity: 131.00 cm/s MV A velocity: 72.60 cm/s   SHUNTS MV E/A ratio:  1.80         Systemic VTI:  0.17 m                             Systemic Diam: 2.10 cm Kardie Tobb DO Electronically signed by Berniece Salines DO Signature Date/Time: 07/28/2021/7:15:39 PM    Final     Assessment/Plan: S/P Procedure(s) (LRB): CORONARY ARTERY BYPASS GRAFTING (CABG) TIMES FOUR USING LEFT INTERNAL MAMMARY ARTERY, GREATER SAPHENOUS VEIN HARVESTED ENDOSCOPICALLY, AND LEFT RADIAL ARTERY HARVESTED OPEN (N/A) TRANSESOPHAGEAL ECHOCARDIOGRAM (TEE) (N/A) RADIAL ARTERY HARVEST (Left) ENDOVEIN HARVEST OF GREATER SAPHENOUS VEIN (Right) MAZE  1 afeb, VSS, AHF team feel he is very stable from their perspective, maintaining sinus rhythm 2 sats good on RA 3 1.5 liters of Urine yesterday 4 creat appears  to have plateaued, today 3.04, hopefully will not require further dialysis 5 anemia is stable 6 BS control is mostly good, one reading over 200 7 TOC for SNF bed, conts rehab modalities  LOS: 23 days    Billy [name] 07/30/2021    No events Good uop, creat trending down [place]

## 2021-07-30 NOTE — Progress Notes (Addendum)
Patient ID: Billy [name], male   DOB: 1966/07/26, 55 y.o.   MRN: [id number]     Advanced Heart Failure Rounding Note  PCP-Cardiologist: None   Subjective:    10/25: S/P CABG x4 with MAZE . Intra Op EF 20%.  10/27: Intubated. Hypotensive. Placed on Norepi + Vaso. Recurrent A fib with amio increased to 60 mg/hr.  10/29: Co-ox low, DBA added 11/1: Extubated. AF with RVR, amio gtt restarted 11/03: Dobutamine off 11/6: Started iHD  11/8: R IJ tunneled HD cath placed 11/14: Echo with EF 40-45%, mild LVH, mildly decreased RV systolic function, normal IVC.   Last HD session 11/12. Made ~ 1.6L urine yesterday. Scr 3.04, baseline 1.9 prior to CABG.  Feels well. No dyspnea. He is excited about his progress. Ambulated 800 ft with CR yesterday.   BP stable 235T - 614E systolic  Received 1 U PRCs 11/09 for Hgb 7.3, Hgb now 7.7>>8.9>>7.9>>8.0   Objective:   Weight Range: 107 kg Body mass index is 30.27 kg/m.   Vital Signs:   Temp:  [97.8 F (36.6 C)-98 F (36.7 C)] 97.8 F (36.6 C) (11/16 0325) Pulse Rate:  [80-82] 80 (11/16 0325) Resp:  [17-21] 21 (11/16 0325) BP: (110-132)/(62-84) 128/84 (11/16 0325) SpO2:  [95 %-98 %] 98 % (11/16 0325) Weight:  [107 kg] 107 kg (11/16 0420) Last BM Date: 07/29/21  Weight change: Filed Weights   07/27/21 2324 07/29/21 0439 07/30/21 0420  Weight: 108.5 kg 109 kg 107 kg    Intake/Output:   Intake/Output Summary (Last 24 hours) at 07/30/2021 0751 Last data filed at 07/30/2021 0330 Gross per 24 hour  Intake 637 ml  Output 1575 ml  Net -938 ml      Physical Exam  General:  No distress.  HEENT: normal Neck:  R IJ tunneled HD catheter. no JVD. Carotids 2+ bilat; no bruits.  Cor: PMI nondisplaced. Regular rate & rhythm. No rubs, gallops or murmurs. Lungs: clear Abdomen: soft, nontender, nondistended. No hepatosplenomegaly. No bruits or masses. Good bowel sounds. Extremities: no cyanosis, clubbing, rash, edema Neuro: alert & orientedx3,  cranial nerves grossly intact. moves all 4 extremities w/o difficulty. Affect pleasant    Telemetry   NSR 70s (personally reviewed)  Labs    CBC Recent Labs    07/29/21 0101 07/30/21 0046  WBC 5.4 5.4  HGB 7.9* 8.0*  HCT 24.4* 24.9*  MCV 96.1 96.5  PLT 297 315   Basic Metabolic Panel Recent Labs    07/29/21 0101 07/30/21 0046  NA 137 140  K 3.8 4.0  CL 102 104  CO2 26 28  GLUCOSE 161* 118*  BUN 48* 48*  CREATININE 3.23* 3.04*  CALCIUM 8.2* 8.4*  MG 2.2 2.0  PHOS 4.3 4.8*   Liver Function Tests Recent Labs    07/29/21 0101 07/30/21 0046  ALBUMIN 2.5* 2.5*   No results for input(s): LIPASE, AMYLASE in the last 72 hours. Cardiac Enzymes No results for input(s): CKTOTAL, CKMB, CKMBINDEX, TROPONINI in the last 72 hours.  BNP: BNP (last 3 results) No results for input(s): BNP in the last 8760 hours.  ProBNP (last 3 results) No results for input(s): PROBNP in the last 8760 hours.   D-Dimer No results for input(s): DDIMER in the last 72 hours. Hemoglobin A1C No results for input(s): HGBA1C in the last 72 hours.  Fasting Lipid Panel No results for input(s): CHOL, HDL, LDLCALC, TRIG, CHOLHDL, LDLDIRECT in the last 72 hours.   Thyroid Function Tests No results for  input(s): TSH, T4TOTAL, T3FREE, THYROIDAB in the last 72 hours.  Invalid input(s): FREET3  Other results:   Imaging    No results found.   Medications:     Scheduled Medications:  ALPRAZolam  1 mg Oral TID   amiodarone  200 mg Oral Daily   apixaban  5 mg Oral BID   atorvastatin  80 mg Oral Daily   carvedilol  3.125 mg Oral BID WC   chlorhexidine  15 mL Mouth Rinse BID   Chlorhexidine Gluconate Cloth  6 each Topical Q0600   clopidogrel  75 mg Oral Daily   darbepoetin (ARANESP) injection - NON-DIALYSIS  100 mcg Subcutaneous Q Mon-1800   feeding supplement (NEPRO CARB STEADY)  237 mL Oral BID BM   hydrALAZINE  12.5 mg Oral TID   insulin aspart  0-15 Units Subcutaneous TID WC    insulin detemir  20 Units Subcutaneous BID   isosorbide mononitrate  30 mg Oral Daily   mouth rinse  15 mL Mouth Rinse q12n4p   multivitamin  1 tablet Oral QHS   pantoprazole  40 mg Oral Daily   sertraline  50 mg Oral Daily   sodium chloride flush  3 mL Intravenous Q12H   tamsulosin  0.4 mg Oral QHS   traZODone  100 mg Oral QHS    Infusions:  promethazine (PHENERGAN) injection (IM or IVPB) Stopped (07/09/21 1628)    PRN Medications: acetaminophen, albuterol, docusate, lip balm, metoprolol tartrate, ondansetron (ZOFRAN) IV, oxyCODONE, polyethylene glycol, promethazine (PHENERGAN) injection (IM or IVPB), sennosides, simethicone    Patient Profile   Billy [name] is a 45 year with a history of visually impaired, DM, HTN, depression, anxiety takes 2 mg xanax 3 times a day, SVT, CKD Stage II, PAF, and CAD.Marland Kitchen   Anterior Stemi--> CABG--> cardiogenic shock.   Assessment/Plan   1. CAD: Admitted with anterolateral MI from occluded diagonal treated with DES.  Patient then had CABG with LIMA-LAD, SVG-D, SVG-PDA, and left radial-OM for complete revascularization on 10/25.  No chest pain.  - Now off ASA given need for Plavix and anticoagulation.  - Continue Plavix 75 daily x 6 months post-PCI.  - atorvastatin 80 mg daily.  - Amlodipine for radial harvest stopped with hypotension while on pressors.    2. Atrial fibrillation: Paroxysmal, s/p Maze and appendage clipping.  He has been maintaining NSR. - Continue PO amio 200 mg daily .  - Eliquis 5 mg bid  3. AKI on CKD stage 3: Baseline creatinine around 1.6, up to 5 post-op.  He has likely had multiple hits including contrast with initial cath/PCI and possible peri-operative hypotension.  Suspect ATN. Concerned about chances of renal recovery. Stopped CVVHD 11/4.  11/6 started iHD, 11/08 R IJ tunneled HD catheter placed. Last HD session 11/12.  Good urine output yesterday. Scr low 3s last few days. Hopefully will plateau and we can avoid HD.   Nephrology following. 4. Acute systolic CHF: Ischemic cardiomyopathy.  Echo prior to this admission with EF 50-55% (pre-MI and CABG).  Limited echo on 10/28 showed minimal pericardial effusion, EF in 25% range. Intraoperative TEE with EF 20%. Off  pressors. Echo 11/14 showed EF improved to 40-45% with mildly decreased RV systolic function.  He is not volume overloaded on exam.  - Continue hydralazine 12.5 mg tid and Imdur 30 mg daily. - Continue  coreg 3.125 mg BID - No ARN/SGLT2/spironolactone with AKI - Will not titrate GDMT any further to allow renal perfusion  5. Anemia: Had 1  unit pRBCs 11/02 + 1 unit 11/9. Hgb 7.3>>8.0>>7.7>>7.9>>8.0. No obvious source of bleeding.  - Received IV iron and Aransep 7. Acute hypoxemic respiratory failure: Suspect primarily pulmonary edema, intubated. Possible PNA with bibasilar lung opacities. Extubated, completed course of cefepime, does not appear volume overloaded. O2 stable on room air. - Completed course of cefepime.  8. Thrombocytopenia: Resolved 9. Right sided weakness: Normal head CT 11/03 10. Urinary Retention: Foley placed 11/7. Foley out 11/13. No difficulty urinating -Continue flomax  Plan for SNF once decision made on +/- HD.   Billy [name], LINDSAY N 07/30/2021 7:51 AM  Patient seen with PA, agree with the above note.   Stable today, creatinine finally trending down.  Good UOP.  Remains in NSR.    No changes from my standpoint.  He should be ready for SNF soon.   Loralie Champagne 07/30/2021

## 2021-07-31 ENCOUNTER — Inpatient Hospital Stay (HOSPITAL_COMMUNITY): Payer: Medicare HMO

## 2021-07-31 DIAGNOSIS — N179 Acute kidney failure, unspecified: Secondary | ICD-10-CM | POA: Diagnosis not present

## 2021-07-31 HISTORY — PX: IR REMOVAL TUN CV CATH W/O FL: IMG2289

## 2021-07-31 LAB — RENAL FUNCTION PANEL
Albumin: 2.7 g/dL — ABNORMAL LOW (ref 3.5–5.0)
Anion gap: 7 (ref 5–15)
BUN: 47 mg/dL — ABNORMAL HIGH (ref 6–20)
CO2: 28 mmol/L (ref 22–32)
Calcium: 8.5 mg/dL — ABNORMAL LOW (ref 8.9–10.3)
Chloride: 106 mmol/L (ref 98–111)
Creatinine, Ser: 2.86 mg/dL — ABNORMAL HIGH (ref 0.61–1.24)
GFR, Estimated: 25 mL/min — ABNORMAL LOW (ref >60)
Glucose, Bld: 90 mg/dL (ref 70–99)
Phosphorus: 4.3 mg/dL (ref 2.5–4.6)
Potassium: 3.8 mmol/L (ref 3.5–5.1)
Sodium: 141 mmol/L (ref 135–145)

## 2021-07-31 LAB — CBC
HCT: 27.3 % — ABNORMAL LOW (ref 39.0–52.0)
Hemoglobin: 8.5 g/dL — ABNORMAL LOW (ref 13.0–17.0)
MCH: 30.7 pg (ref 26.0–34.0)
MCHC: 31.1 g/dL (ref 30.0–36.0)
MCV: 98.6 fL (ref 80.0–100.0)
Platelets: 283 10*3/uL (ref 150–400)
RBC: 2.77 MIL/uL — ABNORMAL LOW (ref 4.22–5.81)
RDW: 14.2 % (ref 11.5–15.5)
WBC: 5 10*3/uL (ref 4.0–10.5)
nRBC: 0 % (ref 0.0–0.2)

## 2021-07-31 LAB — GLUCOSE, CAPILLARY
Glucose-Capillary: 80 mg/dL (ref 70–99)
Glucose-Capillary: 227 mg/dL — ABNORMAL HIGH (ref 70–99)
Glucose-Capillary: 181 mg/dL — ABNORMAL HIGH (ref 70–99)
Glucose-Capillary: 148 mg/dL — ABNORMAL HIGH (ref 70–99)

## 2021-07-31 LAB — MAGNESIUM: Magnesium: 1.8 mg/dL (ref 1.7–2.4)

## 2021-07-31 MED ORDER — CARVEDILOL 6.25 MG PO TABS
6.2500 mg | ORAL_TABLET | Freq: Two times a day (BID) | ORAL | Status: DC
  Administered 2021-07-31 – 2021-08-06 (×12): 6.25 mg via ORAL
  Filled 2021-07-31 (×12): qty 1

## 2021-07-31 MED ORDER — ACETAMINOPHEN 325 MG PO TABS: 650.0000 mg | ORAL_TABLET | Freq: Four times a day (QID) | ORAL | Status: DC | PRN

## 2021-07-31 NOTE — Progress Notes (Signed)
Physical Therapy Treatment Patient Details Name: Billy [name] MRN: [id number] DOB: Feb 11, 1966 Today's Date: 07/31/2021   History of Present Illness Pt is a 55 y.o. male admitted 07/07/21 with STEMI. S/p LHC with PCA. S/p CABGx4 with MAZE 10/25. Extubated post-op on 10/26; reintubated 10/27-11/1. On CRRT 10/27-11/4. iHD initiated 11/6. S/p R IJ tunneled HD cath placement 11/8. PMH includes visual impairment, afib, HTN, DM, R toe amputation, neuropathy, anxiety.    PT Comments    Pt progressing well with mobility, ambulating 1224f independently in hallway today. Pt scored a 22/24 on the Dynamic Gait Index, indicating he is not an increased risk for higher level balance tasks. Pt continues to request SNF prior to discharge due to concerns related to home environment.    Recommendations for follow up therapy are one component of a multi-disciplinary discharge planning process, led by the attending physician.  Recommendations may be updated based on patient status, additional functional criteria and insurance authorization.  Follow Up Recommendations  Skilled nursing-short term rehab (<3 hours/day) (pt request for safety)     Assistance Recommended at Discharge PRN  Equipment Recommendations  None recommended by PT    Recommendations for Other Services       Precautions / Restrictions Precautions Precautions: Sternal;Fall     Mobility  Bed Mobility Overal bed mobility: Independent             General bed mobility comments: pt received in chair    Transfers Overall transfer level: Independent Equipment used: None Transfers: Sit to/from Stand Sit to Stand: Independent                Ambulation/Gait Ambulation/Gait assistance: Independent Gait Distance (Feet): 1200 Feet Assistive device: None Gait Pattern/deviations: Step-through pattern Gait velocity: functional     General Gait Details: pt with steady step-through gait, 1 standing rest break   Stairs              Wheelchair Mobility    Modified Rankin (Stroke Patients Only)       Balance   Sitting-balance support: No upper extremity supported;Feet supported Sitting balance-Leahy Scale: Good Sitting balance - Comments: Pt able to static sit EOB without physical assist; did not challenge sitting balance   Standing balance support: No upper extremity supported;During functional activity Standing balance-Leahy Scale: Normal Standing balance comment: no LOB during static standing or ambulation                 Standardized Balance Assessment Standardized Balance Assessment : Dynamic Gait Index   Dynamic Gait Index Level Surface: Normal Change in Gait Speed: Normal Gait with Horizontal Head Turns: Mild Impairment Gait with Vertical Head Turns: Mild Impairment Gait and Pivot Turn: Normal Step Over Obstacle: Normal Step Around Obstacles: Normal Steps: Normal Total Score: 22      Cognition Arousal/Alertness: Awake/alert Behavior During Therapy: WFL for tasks assessed/performed Overall Cognitive Status: Within Functional Limits for tasks assessed ([facility] LLCfor simple tasks but not formally assessed)                                  Exercises      General Comments General comments (skin integrity, edema, etc.): HR up to 97 during ambulation, BP 141/87 post ambulation      Pertinent Vitals/Pain Pain Assessment: Faces Faces Pain Scale: Hurts a little bit Pain Location: back (towards end of mobility bout) Pain Descriptors / Indicators: [place]                          Prior Function            PT Goals (current goals can now be found in the care plan section) Acute Rehab PT Goals Patient Stated Goal: get home for Thanksgiving PT Goal Formulation: With patient Time For Goal Achievement: 08/04/21 Potential to Achieve Goals: Good Additional Goals Additional Goal #1: Pt will ambulate >1251f idependently without a rest  break. Additional Goal #2: Pt will score a 24/24 on the DGI. Progress towards PT goals: Goals met and updated - see care plan    Frequency    Min 1X/week      PT Plan Frequency needs to be updated    Co-evaluation              AM-PAC PT "6 Clicks" Mobility   Outcome Measure  Help needed turning from your back to your side while in a flat bed without using bedrails?: None Help needed moving from lying on your back to sitting on the side of a flat bed without using bedrails?: None Help needed moving to and from a bed to a chair (including a wheelchair)?: None Help needed standing up from a chair using your arms (e.g., wheelchair or bedside chair)?: None Help needed to walk in hospital room?: None Help needed climbing 3-5 steps with a railing? : A Little 6 Click Score: 23    End of Session     Patient left: Other (comment) (sitting on bed) Nurse Communication: Mobility status PT Visit Diagnosis: Muscle weakness (generalized) (M62.81)     Time: [address]00PT Time Calculation (min) (ACUTE ONLY): 16 min  Charges:  $Therapeutic Exercise: 8-22 mins                     KBrandon Melnick SPT    KBrandon Melnick11/17/2022, 4:11 PM

## 2021-07-31 NOTE — TOC Progression Note (Signed)
Transition of Care [facility]) - Progression Note    Patient Details  Name: Billy [name] MRN: [id number] Date of Birth: 1965-12-24  Transition of Care [facility]) CM/SW [place], [place] Phone Number: 07/31/2021, 11:09 AM  Clinical Narrative:    HF CSW spoke with [place] to see about the insurance authorization and Shirlee Limerick reported that the system was down yesterday and wasn't able to get the insurance started and Shirlee Limerick will let the CSW know when the insurance has been approved.   CSW will continue to follow throughout discharge.   Expected Discharge Plan: D[facility] Barriers to Discharge: Continued Medical Work up  Expected Discharge Plan and Services Expected Discharge Plan: Door In-house Referral: Clinical Social Work Discharge Planning Services: CM Consult Post Acute Care Choice: Strong Living arrangements for the past 2 months: Single Family Home                                       Social Determinants of Health (SDOH) Interventions Food Insecurity Interventions: Intervention Not Indicated Financial Strain Interventions: Intervention Not Indicated Housing Interventions: Intervention Not Indicated Transportation Interventions: Intervention Not Indicated, Other (Comment) (Pt is visually impaired but receives help from family with transportation needs.)  Readmission Risk Interventions No flowsheet data found.  Robynn Marcel, MSW, [place] Heart Failure Social Worker

## 2021-07-31 NOTE — Progress Notes (Signed)
Discussed HF, IS, sternal precautions, exercise, some diet, and CRPII. Pt with many questions. He would benefit from RD discussion to help him balance renal, heart, DM diet. Will refer to [place]. Set up d/c video.  [place], ACSM 3:18 PM 07/31/2021

## 2021-07-31 NOTE — Consult Note (Signed)
   [facility] CM Inpatient Consult   07/31/2021  Billy [name] [birth year] [id number]  Follow up: 07/30/21 @ 1636  Message received from inpatient TOC LCSW, [place] regarding post SNF facility needs for Meals on Wheels.  Plan:  Continue to follow for disposition and will refer to appropriate [facility] team member.  Natividad Brood, RN BSN [facility] Liaison  [phone] business mobile phone Toll free office [phone]  Fax number: [phone] Billy [name] [email or web address]

## 2021-07-31 NOTE — Progress Notes (Signed)
Patient ID: Billy [name], male   DOB: 1965/10/30, 55 y.o.   MRN: [id number]     Advanced Heart Failure Rounding Note  PCP-Cardiologist: None   Subjective:    10/25: S/P CABG x4 with MAZE . Intra Op EF 20%.  10/27: Intubated. Hypotensive. Placed on Norepi + Vaso. Recurrent A fib with amio increased to 60 mg/hr.  10/29: Co-ox low, DBA added 11/1: Extubated. AF with RVR, amio gtt restarted 11/03: Dobutamine off 11/6: Started iHD  11/8: R IJ tunneled HD cath placed 11/14: Echo with EF 40-45%, mild LVH, mildly decreased RV systolic function, normal IVC.   Last HD session 11/12. Good UOP, creatinine down to 2.86, baseline 1.9 prior to CABG.  Feels well. No dyspnea. Walked 1000 feet this morning. No lightheadedness.   BP stable 283T - 517O systolic, he remains in NSR.   Received 1 U PRCs 11/09 for Hgb 7.3, Hgb now 7.7>>8.9>>7.9>>8.0>>8.6   Objective:   Weight Range: 112.9 kg Body mass index is 31.96 kg/m.   Vital Signs:   Temp:  [97.9 F (36.6 C)-98.4 F (36.9 C)] 98 F (36.7 C) (11/17 0803) Pulse Rate:  [74-82] 81 (11/17 0803) Resp:  [15-22] 17 (11/17 0803) BP: (117-128)/(63-89) 125/77 (11/17 0803) SpO2:  [95 %-99 %] 99 % (11/17 0803) Weight:  [112.9 kg] 112.9 kg (11/17 0423) Last BM Date: 07/30/21  Weight change: Filed Weights   07/29/21 0439 07/30/21 0420 07/31/21 0423  Weight: 109 kg 107 kg 112.9 kg    Intake/Output:   Intake/Output Summary (Last 24 hours) at 07/31/2021 0940 Last data filed at 07/31/2021 0804 Gross per 24 hour  Intake 913 ml  Output 2725 ml  Net -1812 ml      Physical Exam  General: NAD Neck: No JVD, no thyromegaly or thyroid nodule.  Lungs: Clear to auscultation bilaterally with normal respiratory effort. CV: Nondisplaced PMI.  Heart regular S1/S2, no S3/S4, no murmur.  No peripheral edema.  Abdomen: Soft, nontender, no hepatosplenomegaly, no distention.  Skin: Intact without lesions or rashes.  Neurologic: Alert and oriented x 3.   Psych: Normal affect. Extremities: No clubbing or cyanosis.  HEENT: Normal.   Telemetry   NSR 70s (personally reviewed)  Labs    CBC Recent Labs    07/30/21 0046 07/31/21 0334  WBC 5.4 5.0  HGB 8.0* 8.5*  HCT 24.9* 27.3*  MCV 96.5 98.6  PLT 284 160   Basic Metabolic Panel Recent Labs    07/30/21 0046 07/31/21 0334  NA 140 141  K 4.0 3.8  CL 104 106  CO2 28 28  GLUCOSE 118* 90  BUN 48* 47*  CREATININE 3.04* 2.86*  CALCIUM 8.4* 8.5*  MG 2.0 1.8  PHOS 4.8* 4.3   Liver Function Tests Recent Labs    07/30/21 0046 07/31/21 0334  ALBUMIN 2.5* 2.7*   No results for input(s): LIPASE, AMYLASE in the last 72 hours. Cardiac Enzymes No results for input(s): CKTOTAL, CKMB, CKMBINDEX, TROPONINI in the last 72 hours.  BNP: BNP (last 3 results) No results for input(s): BNP in the last 8760 hours.  ProBNP (last 3 results) No results for input(s): PROBNP in the last 8760 hours.   D-Dimer No results for input(s): DDIMER in the last 72 hours. Hemoglobin A1C No results for input(s): HGBA1C in the last 72 hours.  Fasting Lipid Panel No results for input(s): CHOL, HDL, LDLCALC, TRIG, CHOLHDL, LDLDIRECT in the last 72 hours.   Thyroid Function Tests No results for input(s): TSH, T4TOTAL, T3FREE,  THYROIDAB in the last 72 hours.  Invalid input(s): FREET3  Other results:   Imaging    No results found.   Medications:     Scheduled Medications:  ALPRAZolam  1 mg Oral TID   amiodarone  200 mg Oral Daily   apixaban  5 mg Oral BID   atorvastatin  80 mg Oral Daily   carvedilol  6.25 mg Oral BID WC   chlorhexidine  15 mL Mouth Rinse BID   Chlorhexidine Gluconate Cloth  6 each Topical Q0600   clopidogrel  75 mg Oral Daily   darbepoetin (ARANESP) injection - NON-DIALYSIS  100 mcg Subcutaneous Q Mon-1800   feeding supplement (NEPRO CARB STEADY)  237 mL Oral BID BM   hydrALAZINE  12.5 mg Oral TID   insulin aspart  0-15 Units Subcutaneous TID WC   insulin  detemir  20 Units Subcutaneous BID   isosorbide mononitrate  30 mg Oral Daily   mouth rinse  15 mL Mouth Rinse q12n4p   multivitamin  1 tablet Oral QHS   pantoprazole  40 mg Oral Daily   sertraline  50 mg Oral Daily   sodium chloride flush  3 mL Intravenous Q12H   tamsulosin  0.4 mg Oral QHS   traZODone  100 mg Oral QHS    Infusions:  promethazine (PHENERGAN) injection (IM or IVPB) Stopped (07/09/21 1628)    PRN Medications: acetaminophen, albuterol, docusate, lip balm, metoprolol tartrate, ondansetron (ZOFRAN) IV, oxyCODONE, polyethylene glycol, promethazine (PHENERGAN) injection (IM or IVPB), sennosides, simethicone    Patient Profile   Billy [name] is a 46 year with a history of visually impaired, DM, HTN, depression, anxiety takes 2 mg xanax 3 times a day, SVT, CKD Stage II, PAF, and CAD.Marland Kitchen   Anterior Stemi--> CABG--> cardiogenic shock.   Assessment/Plan   1. CAD: Admitted with anterolateral MI from occluded diagonal treated with DES.  Patient then had CABG with LIMA-LAD, SVG-D, SVG-PDA, and left radial-OM for complete revascularization on 10/25.  No chest pain.  - Now off ASA given need for Plavix and anticoagulation.  - Continue Plavix 75 daily x 6 months post-PCI.  - atorvastatin 80 mg daily.  - Amlodipine for radial harvest stopped with hypotension while on pressors.    2. Atrial fibrillation: Paroxysmal, s/p Maze and appendage clipping.  He has been maintaining NSR. - Continue PO amio 200 mg daily.  - Eliquis 5 mg bid  3. AKI on CKD stage 3: Baseline creatinine around 1.6, up to 5 post-op.  He has likely had multiple hits including contrast with initial cath/PCI and possible peri-operative hypotension.  Suspect ATN. Concerned about chances of renal recovery. Stopped CVVHD 11/4.  11/6 started iHD, 11/08 R IJ tunneled HD catheter placed. Last HD session 11/12.  Good urine output yesterday. Creatinine trending down to 2.86 today with good UOP. Removing HD catheter. 4. Acute  systolic CHF: Ischemic cardiomyopathy.  Echo prior to this admission with EF 50-55% (pre-MI and CABG).  Limited echo on 10/28 showed minimal pericardial effusion, EF in 25% range. Intraoperative TEE with EF 20%. Off  pressors. Echo 11/14 showed EF improved to 40-45% with mildly decreased RV systolic function.  He is not volume overloaded on exam.  - Continue hydralazine 12.5 mg tid and Imdur 30 mg daily. - Think we can increase Coreg to 6.25 mg bid for home.  - No ARN/SGLT2/spironolactone with AKI 5. Anemia: Had 1 unit pRBCs 11/02 + 1 unit 11/9. Hgb 7.3>>8.0>>7.7>>7.9>>8.0>>8.6. No obvious source of bleeding.  -  Received IV iron and Aransep 7. Acute hypoxemic respiratory failure: Suspect primarily pulmonary edema, intubated. Possible PNA with bibasilar lung opacities. Extubated, completed course of cefepime, does not appear volume overloaded. O2 stable on room air. - Completed course of cefepime.  8. Thrombocytopenia: Resolved 9. Right sided weakness: Normal head CT 11/03 10. Urinary Retention: Foley placed 11/7. Foley out 11/13. No difficulty urinating -Continue flomax  Ready for SNF from my standpoint.  Will arrange followup CHF clinic.    Loralie Champagne 07/31/2021 9:40 AM

## 2021-07-31 NOTE — Progress Notes (Signed)
Patient ID: Billy [name], male   DOB: May 25, 1966, 55 y.o.   MRN: [id number] S: Feels well, no new complaints. O:BP 125/77 (BP Location: Left Arm)   Pulse 81   Temp 98 F (36.7 C) (Oral)   Resp 17   Ht 6\' 2"  (1.88 m)   Wt 112.9 kg   SpO2 99%   BMI 31.96 kg/m   Intake/Output Summary (Last 24 hours) at 07/31/2021 0930 Last data filed at 07/31/2021 0804 Gross per 24 hour  Intake 913 ml  Output 2725 ml  Net -1812 ml   Intake/Output: I/O last 3 completed shifts: In: 962 [P.O.:750; I.V.:3] Out: 2900 [Urine:2900]  Intake/Output this shift:  Total I/O In: 360 [P.O.:360] Out: 1100 [Urine:1100] Weight change: 5.942 kg Gen: NAD CVS:RRR Resp:CTA Abd:+BS, soft, NT/ND Ext: trace pretibial edema  Recent Labs  Lab 07/25/21 0115 07/26/21 0057 07/27/21 0125 07/28/21 0334 07/29/21 0101 07/30/21 0046 07/31/21 0334  NA 135 134* 134* 137 137 140 141  K 3.9 3.8 3.6 4.3 3.8 4.0 3.8  CL 99 100 97* 103 102 104 106  CO2 26 25 28 26 26 28 28   GLUCOSE 124* 131* 151* 124* 161* 118* 90  BUN 36* 48* 25* 38* 48* 48* 47*  CREATININE 3.39* 4.01* 2.54* 3.07* 3.23* 3.04* 2.86*  ALBUMIN 2.3* 2.3* 2.3* 2.3* 2.5* 2.5* 2.7*  CALCIUM 7.8* 7.9* 7.7* 8.1* 8.2* 8.4* 8.5*  PHOS 4.3 4.2 3.2 3.8 4.3 4.8* 4.3   Liver Function Tests: Recent Labs  Lab 07/29/21 0101 07/30/21 0046 07/31/21 0334  ALBUMIN 2.5* 2.5* 2.7*   No results for input(s): LIPASE, AMYLASE in the last 168 hours. No results for input(s): AMMONIA in the last 168 hours. CBC: Recent Labs  Lab 07/27/21 0125 07/28/21 0740 07/29/21 0101 07/30/21 0046 07/31/21 0334  WBC 7.7 6.9 5.4 5.4 5.0  HGB 7.7* 8.9* 7.9* 8.0* 8.5*  HCT 23.7* 28.1* 24.4* 24.9* 27.3*  MCV 96.0 97.6 96.1 96.5 98.6  PLT 388 440* 297 284 283   Cardiac Enzymes: No results for input(s): CKTOTAL, CKMB, CKMBINDEX, TROPONINI in the last 168 hours. CBG: Recent Labs  Lab 07/30/21 0644 07/30/21 1117 07/30/21 1636 07/30/21 2207 07/31/21 0625  GLUCAP 97 152*  232* 146* 80    Iron Studies: No results for input(s): IRON, TIBC, TRANSFERRIN, FERRITIN in the last 72 hours. Studies/Results: No results found.  ALPRAZolam  1 mg Oral TID   amiodarone  200 mg Oral Daily   apixaban  5 mg Oral BID   atorvastatin  80 mg Oral Daily   carvedilol  3.125 mg Oral BID WC   chlorhexidine  15 mL Mouth Rinse BID   Chlorhexidine Gluconate Cloth  6 each Topical Q0600   clopidogrel  75 mg Oral Daily   darbepoetin (ARANESP) injection - NON-DIALYSIS  100 mcg Subcutaneous Q Mon-1800   feeding supplement (NEPRO CARB STEADY)  237 mL Oral BID BM   hydrALAZINE  12.5 mg Oral TID   insulin aspart  0-15 Units Subcutaneous TID WC   insulin detemir  20 Units Subcutaneous BID   isosorbide mononitrate  30 mg Oral Daily   mouth rinse  15 mL Mouth Rinse q12n4p   multivitamin  1 tablet Oral QHS   pantoprazole  40 mg Oral Daily   sertraline  50 mg Oral Daily   sodium chloride flush  3 mL Intravenous Q12H   tamsulosin  0.4 mg Oral QHS   traZODone  100 mg Oral QHS    BMET  Component Value Date/Time   NA 141 07/31/2021 0334   K 3.8 07/31/2021 0334   CL 106 07/31/2021 0334   CO2 28 07/31/2021 0334   GLUCOSE 90 07/31/2021 0334   BUN 47 (H) 07/31/2021 0334   BUN 31 02/26/2016 0000   CREATININE 2.86 (H) 07/31/2021 0334   CREATININE 1.02 09/09/2015 1710   CALCIUM 8.5 (L) 07/31/2021 0334   GFRNONAA 25 (L) 07/31/2021 0334   GFRAA >60 07/07/2017 0429   CBC    Component Value Date/Time   WBC 5.0 07/31/2021 0334   RBC 2.77 (L) 07/31/2021 0334   HGB 8.5 (L) 07/31/2021 0334   HCT 27.3 (L) 07/31/2021 0334   PLT 283 07/31/2021 0334   MCV 98.6 07/31/2021 0334   MCH 30.7 07/31/2021 0334   MCHC 31.1 07/31/2021 0334   RDW 14.2 07/31/2021 0334   LYMPHSABS 1.1 07/16/2021 0415   MONOABS 0.5 07/16/2021 0415   EOSABS 0.0 07/16/2021 0415   BASOSABS 0.0 07/16/2021 0415    Assessment/Plan:   AKI/CKD stage IIIb - presumably ischemic ATN in setting of hemodynamic instability  post CABG +/- cholesterol emboli.  Started on CRRT 10/27 due to anuria and volume overload and transitioned to iHD on 11/6, RIJ TDC placed 07/22/21.   Last HD session 07/26/21 but now with increased UOP and finally improvement of BUN/Cr. Scr continues to improve, will d/c TDC.   No uremic symptoms or volume overload. Scr was 1.9 prior to CABG (followed by Dr. Moshe Cipro) CAD s/p CABG- per primary ABLA - low iron s/p IV iron.  On Aranesp. Continue to follow. A fib - on amiodarone and anticoagulation per primary. Urinary retention - s/p foley catheter removal.  Good UOP and continue to follow.  Donetta Potts, MD [name] [phone]

## 2021-07-31 NOTE — Progress Notes (Signed)
      [address]       [place] [address]             [phone]        23 Days Post-Op Procedure(s) (LRB): CORONARY ARTERY BYPASS GRAFTING (CABG) TIMES FOUR USING LEFT INTERNAL MAMMARY ARTERY, GREATER SAPHENOUS VEIN HARVESTED ENDOSCOPICALLY, AND LEFT RADIAL ARTERY HARVESTED OPEN (N/A) TRANSESOPHAGEAL ECHOCARDIOGRAM (TEE) (N/A) RADIAL ARTERY HARVEST (Left) ENDOVEIN HARVEST OF GREATER SAPHENOUS VEIN (Right) MAZE  Subjective: Patient eating breakfast this am. He has no specific complaint  Objective: Vital signs in last 24 hours: Temp:  [97.7 F (36.5 C)-98.4 F (36.9 C)] 98.4 F (36.9 C) (11/17 0423) Pulse Rate:  [74-82] 82 (11/17 0630) Cardiac Rhythm: Normal sinus rhythm (11/16 1906) Resp:  [15-22] 22 (11/17 0423) BP: (112-128)/(63-89) 128/89 (11/17 0423) SpO2:  [95 %-98 %] 98 % (11/17 0423) Weight:  [112.9 kg] 112.9 kg (11/17 0423)  Pre op weight  115.7 kg Current Weight  07/31/21 112.9 kg       Intake/Output from previous day: 11/16 0701 - 11/17 0700 In: 753 [P.O.:750; I.V.:3] Out: 1625 [Urine:1625]   Physical Exam:  Cardiovascular: RRR Pulmonary: Clear to auscultation bilaterally Abdomen: Soft, non tender, bowel sounds present. Extremities: No  lower extremity edema. Wounds: Clean and dry.  No erythema or signs of infection.  Lab Results: CBC: Recent Labs    07/30/21 0046 07/31/21 0334  WBC 5.4 5.0  HGB 8.0* 8.5*  HCT 24.9* 27.3*  PLT 284 283   BMET:  Recent Labs    07/30/21 0046 07/31/21 0334  NA 140 141  K 4.0 3.8  CL 104 106  CO2 28 28  GLUCOSE 118* 90  BUN 48* 47*  CREATININE 3.04* 2.86*  CALCIUM 8.4* 8.5*    PT/INR:  Lab Results  Component Value Date   INR 1.2 07/10/2021   INR 1.3 (H) 07/08/2021   INR 0.9 07/07/2021   ABG:  INR: Will add last result for INR, ABG once components are confirmed Will add last 4 CBG results once components are confirmed  Assessment/Plan: 1. CV - S/p NSTEMI. PAF-had MAZE  and LA clip at the time of surgery. On Amiodarone 200 mg daily, Coreg 3.125 mg bid, Plavix 75 mg daily, Imdur 30 mg daily, and Apixaban 5 mg bid. 2.  Pulmonary - On room air. Encourage incentive spirometer. 3. AKI on CKD (stage III)-Creatinine this am decreased to 2.86. Good UO 4.  Expected post op acute blood loss anemia - H and H this am stable at 8.5 and 27.3. On Aranesp. 5. Acute systolic CHF-Heart failure following 6. DM-CBGs 232/146/80. On Insulin. Pre op HGA1C 6.7 7. Will need SNF at discharge  [place] 07/31/2021,7:10 AM

## 2021-07-31 NOTE — Procedures (Signed)
PROCEDURE SUMMARY:  Successful removal of tunneled hemodialysis catheter.  Patient tolerated well.  EBL < 5 mL  See full dictation in Imaging for details.  Agnes Probert S Manami Tutor PA-C 07/31/2021 1:02 PM

## 2021-07-31 NOTE — Progress Notes (Signed)
Mobility Specialist Progress Note    07/31/21 1121  Mobility  Activity Ambulated in hall  Level of Assistance Independent  Assistive Device None  Distance Ambulated (ft) 1220 ft  Mobility Ambulated independently in hallway  Mobility Response Tolerated well  Mobility performed by Mobility specialist  $Mobility charge 1 Mobility   Pre-Mobility: 78 HR, 125/83 BP Post-Mobility: 84 HR, 138/82 BP  Pt received standing by door and agreeable. C/o a minimal amount of dizziness but stated he thinks it was because he just took meds. Took x1 short standing recovery break during each lap around the unit. Returned to sitting EOB with call bell in reach.   Pt states he is feeling more confident on his walks.   Billy [name] Mobility Specialist  Mobility Specialist Phone: [phone]

## 2021-07-31 NOTE — Progress Notes (Signed)
Pt refused VS, requested to wait until 0400

## 2021-07-31 NOTE — Plan of Care (Signed)

## 2021-08-01 ENCOUNTER — Telehealth: Payer: Medicare HMO | Admitting: Thoracic Surgery (Cardiothoracic Vascular Surgery)

## 2021-08-01 DIAGNOSIS — N179 Acute kidney failure, unspecified: Secondary | ICD-10-CM | POA: Diagnosis not present

## 2021-08-01 LAB — CBC
HCT: 26.2 % — ABNORMAL LOW (ref 39.0–52.0)
Hemoglobin: 8 g/dL — ABNORMAL LOW (ref 13.0–17.0)
MCH: 30.3 pg (ref 26.0–34.0)
MCHC: 30.5 g/dL (ref 30.0–36.0)
MCV: 99.2 fL (ref 80.0–100.0)
Platelets: 262 10*3/uL (ref 150–400)
RBC: 2.64 MIL/uL — ABNORMAL LOW (ref 4.22–5.81)
RDW: 14.2 % (ref 11.5–15.5)
WBC: 4.7 10*3/uL (ref 4.0–10.5)
nRBC: 0 % (ref 0.0–0.2)

## 2021-08-01 LAB — RENAL FUNCTION PANEL
Albumin: 2.6 g/dL — ABNORMAL LOW (ref 3.5–5.0)
Anion gap: 10 (ref 5–15)
BUN: 49 mg/dL — ABNORMAL HIGH (ref 6–20)
CO2: 24 mmol/L (ref 22–32)
Calcium: 8.5 mg/dL — ABNORMAL LOW (ref 8.9–10.3)
Chloride: 107 mmol/L (ref 98–111)
Creatinine, Ser: 2.74 mg/dL — ABNORMAL HIGH (ref 0.61–1.24)
GFR, Estimated: 26 mL/min — ABNORMAL LOW (ref >60)
Glucose, Bld: 154 mg/dL — ABNORMAL HIGH (ref 70–99)
Phosphorus: 4.2 mg/dL (ref 2.5–4.6)
Potassium: 4.6 mmol/L (ref 3.5–5.1)
Sodium: 141 mmol/L (ref 135–145)

## 2021-08-01 LAB — GLUCOSE, CAPILLARY
Glucose-Capillary: 72 mg/dL (ref 70–99)
Glucose-Capillary: 163 mg/dL — ABNORMAL HIGH (ref 70–99)
Glucose-Capillary: 231 mg/dL — ABNORMAL HIGH (ref 70–99)
Glucose-Capillary: 169 mg/dL — ABNORMAL HIGH (ref 70–99)

## 2021-08-01 LAB — MAGNESIUM: Magnesium: 1.8 mg/dL (ref 1.7–2.4)

## 2021-08-01 MED ORDER — INSULIN DETEMIR 100 UNIT/ML ~~LOC~~ SOLN
15.0000 [IU] | Freq: Two times a day (BID) | SUBCUTANEOUS | Status: DC
  Administered 2021-08-01 – 2021-08-03 (×4): 15 [IU] via SUBCUTANEOUS
  Filled 2021-08-01 (×7): qty 0.15

## 2021-08-01 NOTE — Progress Notes (Signed)
Patient ID: Billy [name], male   DOB: 18-Nov-1965, 55 y.o.   MRN: [id number] S: Feels well but admits to dizziness when standing and walking. O:BP 135/69 (BP Location: Left Arm)   Pulse 80   Temp 97.6 F (36.4 C)   Resp 20   Ht 6\' 2"  (1.88 m)   Wt 108.9 kg   SpO2 96%   BMI 30.82 kg/m   Intake/Output Summary (Last 24 hours) at 08/01/2021 1020 Last data filed at 07/31/2021 2330 Gross per 24 hour  Intake 1197 ml  Output 375 ml  Net 822 ml   Intake/Output: I/O last 3 completed shifts: In: 2297 [P.O.:1080; I.V.:9; NG/GT:474] Out: 2100 [Urine:2100]  Intake/Output this shift:  No intake/output data recorded. Weight change: -4 kg Gen: NAD CVS: RRR Resp: CTA Abd: +BS, soft, NT/ND Ext: trace pretibial edema  Recent Labs  Lab 07/26/21 0057 07/27/21 0125 07/28/21 0334 07/29/21 0101 07/30/21 0046 07/31/21 0334 08/01/21 0108  NA 134* 134* 137 137 140 141 141  K 3.8 3.6 4.3 3.8 4.0 3.8 4.6  CL 100 97* 103 102 104 106 107  CO2 25 28 26 26 28 28 24   GLUCOSE 131* 151* 124* 161* 118* 90 154*  BUN 48* 25* 38* 48* 48* 47* 49*  CREATININE 4.01* 2.54* 3.07* 3.23* 3.04* 2.86* 2.74*  ALBUMIN 2.3* 2.3* 2.3* 2.5* 2.5* 2.7* 2.6*  CALCIUM 7.9* 7.7* 8.1* 8.2* 8.4* 8.5* 8.5*  PHOS 4.2 3.2 3.8 4.3 4.8* 4.3 4.2   Liver Function Tests: Recent Labs  Lab 07/30/21 0046 07/31/21 0334 08/01/21 0108  ALBUMIN 2.5* 2.7* 2.6*   No results for input(s): LIPASE, AMYLASE in the last 168 hours. No results for input(s): AMMONIA in the last 168 hours. CBC: Recent Labs  Lab 07/28/21 0740 07/29/21 0101 07/30/21 0046 07/31/21 0334 08/01/21 0108  WBC 6.9 5.4 5.4 5.0 4.7  HGB 8.9* 7.9* 8.0* 8.5* 8.0*  HCT 28.1* 24.4* 24.9* 27.3* 26.2*  MCV 97.6 96.1 96.5 98.6 99.2  PLT 440* 297 284 283 262   Cardiac Enzymes: No results for input(s): CKTOTAL, CKMB, CKMBINDEX, TROPONINI in the last 168 hours. CBG: Recent Labs  Lab 07/31/21 0625 07/31/21 1206 07/31/21 1604 07/31/21 2152 08/01/21 0643   GLUCAP 80 227* 181* 148* 72    Iron Studies: No results for input(s): IRON, TIBC, TRANSFERRIN, FERRITIN in the last 72 hours. Studies/Results: IR Removal Tun Cv Cath W/O FL  Result Date: 07/31/2021 INDICATION: History of acute kidney injury with need for hemodialysis. Return of renal function and now no longer needs dialysis catheter. Request for removal. It was initially placed on June 24, 2021 by Dr. Anselm Pancoast. EXAM: REMOVAL OF TUNNELED HEMODIALYSIS CATHETER MEDICATIONS: None COMPLICATIONS: None immediate. PROCEDURE: Informed consent was obtained from the patient following an explanation of the procedure, risks, benefits and alternatives to treatment. A time out was performed prior to the initiation of the procedure. Using only gentle traction the catheter was removed intact without difficulty. Hemostasis was obtained with manual compression. A dressing was placed. The patient tolerated the procedure well without immediate post procedural complication. IMPRESSION: Successful removal of tunneled dialysis catheter. Read by: Gareth Eagle, PA-C Electronically Signed   By: Jacqulynn Cadet M.D.   On: 07/31/2021 17:05    ALPRAZolam  1 mg Oral TID   amiodarone  200 mg Oral Daily   apixaban  5 mg Oral BID   atorvastatin  80 mg Oral Daily   carvedilol  6.25 mg Oral BID WC   chlorhexidine  15 mL  Mouth Rinse BID   Chlorhexidine Gluconate Cloth  6 each Topical Q0600   clopidogrel  75 mg Oral Daily   darbepoetin (ARANESP) injection - NON-DIALYSIS  100 mcg Subcutaneous Q Mon-1800   feeding supplement (NEPRO CARB STEADY)  237 mL Oral BID BM   hydrALAZINE  12.5 mg Oral TID   insulin aspart  0-15 Units Subcutaneous TID WC   insulin detemir  15 Units Subcutaneous BID   isosorbide mononitrate  30 mg Oral Daily   mouth rinse  15 mL Mouth Rinse q12n4p   multivitamin  1 tablet Oral QHS   pantoprazole  40 mg Oral Daily   sertraline  50 mg Oral Daily   sodium chloride flush  3 mL Intravenous Q12H   tamsulosin   0.4 mg Oral QHS   traZODone  100 mg Oral QHS    BMET    Component Value Date/Time   NA 141 08/01/2021 0108   K 4.6 08/01/2021 0108   CL 107 08/01/2021 0108   CO2 24 08/01/2021 0108   GLUCOSE 154 (H) 08/01/2021 0108   BUN 49 (H) 08/01/2021 0108   BUN 31 02/26/2016 0000   CREATININE 2.74 (H) 08/01/2021 0108   CREATININE 1.02 09/09/2015 1710   CALCIUM 8.5 (L) 08/01/2021 0108   GFRNONAA 26 (L) 08/01/2021 0108   GFRAA >60 07/07/2017 0429   CBC    Component Value Date/Time   WBC 4.7 08/01/2021 0108   RBC 2.64 (L) 08/01/2021 0108   HGB 8.0 (L) 08/01/2021 0108   HCT 26.2 (L) 08/01/2021 0108   PLT 262 08/01/2021 0108   MCV 99.2 08/01/2021 0108   MCH 30.3 08/01/2021 0108   MCHC 30.5 08/01/2021 0108   RDW 14.2 08/01/2021 0108   LYMPHSABS 1.1 07/16/2021 0415   MONOABS 0.5 07/16/2021 0415   EOSABS 0.0 07/16/2021 0415   BASOSABS 0.0 07/16/2021 0415    Assessment/Plan:   AKI/CKD stage IIIb - presumably ischemic ATN in setting of hemodynamic instability post CABG +/- cholesterol emboli.  Started on CRRT 10/27 due to anuria and volume overload and transitioned to iHD on 11/6, RIJ TDC placed 07/22/21.   Last HD session 07/26/21 but now with increased UOP and improvement of Scr. Scr continues to improve, TDC removed 07/31/21 by IR.   No uremic symptoms or volume overload. Scr was 1.9 prior to CABG (followed by Dr. Moshe Cipro) Nearing his baseline.  Will sign off and arrange for outpatient follow up with Dr. Moshe Cipro in 2-3 weeks after discharge Call with any questions or concerns. CAD s/p CABG- per primary Dizziness - possible orthostasis.  Consider decreasing or stopping hydralazine.  Will defer to CTS. ABLA - low iron s/p IV iron.  On Aranesp. Continue to follow. A fib - on amiodarone and anticoagulation per primary. Urinary retention - s/p foley catheter removal.  Good UOP and continue to follow.    Donetta Potts, MD [name] [phone]

## 2021-08-01 NOTE — TOC Progression Note (Signed)
Transition of Care [facility]) - Progression Note    Patient Details  Name: Billy [name] MRN: [id number] Date of Birth: 1966-09-09  [facility] CM/SW Contact  Herb Beltre, [place] Phone Number: 08/01/2021, 1:32 PM  Clinical Narrative:    HF CSW received a call from Dixie at [facility] [place] that the insurance authorization was denied and went to peer to peer and for the attending MD to call [phone] option 3 reference#: [id number] subscriber PN#:[id number] and call before Monday 08/04/2021 at noon. Information provided to attending MD.  CSW will continue to follow throughout discharge.   Expected Discharge Plan: [place] Barriers to Discharge: Continued Medical Work up  Expected Discharge Plan and Services Expected Discharge Plan: [place] In-house Referral: Clinical Social Work Discharge Planning Services: CM Consult Post Acute Care Choice: [place] Living arrangements for the past 2 months: Single Family Home                                       Social Determinants of Health (SDOH) Interventions Food Insecurity Interventions: Intervention Not Indicated Financial Strain Interventions: Intervention Not Indicated Housing Interventions: Intervention Not Indicated Transportation Interventions: Intervention Not Indicated, Other (Comment) (Pt is visually impaired but receives help from family with transportation needs.)  Readmission Risk Interventions No flowsheet data found.  Adrien Shankar, MSW, [place] Heart Failure Social Worker

## 2021-08-01 NOTE — Progress Notes (Signed)
Mobility Specialist Progress Note    08/01/21 1556  Mobility  Activity Ambulated in hall  Level of Assistance Independent  Assistive Device None  Distance Ambulated (ft) 800 ft  Mobility Ambulated independently in hallway  Mobility Response Tolerated well  Mobility performed by Mobility specialist  $Mobility charge 1 Mobility   Pt received in bed and agreeable. No c/o dizziness. Took x1 short standing break on each lap. Returned to sitting EOB with call bell in reach.   [facility] Mobility Specialist  M.S. Primary Phone: [phone] M.S. Secondary Phone: [phone]

## 2021-08-01 NOTE — Progress Notes (Addendum)
   08/01/21 1731  Orthostatic Lying   BP- Lying 115/74  Orthostatic Sitting  BP- Sitting 116/74  Orthostatic Standing at 3 minutes  BP- Standing at 3 minutes 110/70   Pt received in bed and unsure about ambulation. Pt had attempted to ambulate earlier in the morning but got dizzy and diaphoretic. Took orthostatic BP but pt felt it was better to rest and try again in the afternoon.

## 2021-08-01 NOTE — Progress Notes (Addendum)
      [address]       [place] [address]             [phone]        24 Days Post-Op Procedure(s) (LRB): CORONARY ARTERY BYPASS GRAFTING (CABG) TIMES FOUR USING LEFT INTERNAL MAMMARY ARTERY, GREATER SAPHENOUS VEIN HARVESTED ENDOSCOPICALLY, AND LEFT RADIAL ARTERY HARVESTED OPEN (N/A) TRANSESOPHAGEAL ECHOCARDIOGRAM (TEE) (N/A) RADIAL ARTERY HARVEST (Left) ENDOVEIN HARVEST OF GREATER SAPHENOUS VEIN (Right) MAZE  Subjective: Patient had some dizziness with standing yesterday.   Objective: Vital signs in last 24 hours: Temp:  [97.6 F (36.4 C)-98.6 F (37 C)] 97.6 F (36.4 C) (11/17 2330) Pulse Rate:  [73-86] 80 (11/18 0647) Cardiac Rhythm: Normal sinus rhythm (11/18 0708) Resp:  [16-21] 21 (11/17 2330) BP: (119-149)/(70-89) 132/89 (11/18 0647) SpO2:  [94 %-99 %] 96 % (11/17 2330) Weight:  [108.9 kg] 108.9 kg (11/18 0526)  Pre op weight  115.7 kg Current Weight  08/01/21 108.9 kg       Intake/Output from previous day: 11/17 0701 - 11/18 0700 In: 1560 [P.O.:1080; I.V.:6; NG/GT:474] Out: 1475 [Urine:1475]   Physical Exam:  Cardiovascular: RRR Pulmonary: Clear to auscultation bilaterally Abdomen: Soft, non tender, bowel sounds present. Extremities: No  lower extremity edema. Wounds: Clean and dry.  No erythema or signs of infection.  Lab Results: CBC: Recent Labs    07/31/21 0334 08/01/21 0108  WBC 5.0 4.7  HGB 8.5* 8.0*  HCT 27.3* 26.2*  PLT 283 262    BMET:  Recent Labs    07/31/21 0334 08/01/21 0108  NA 141 141  K 3.8 4.6  CL 106 107  CO2 28 24  GLUCOSE 90 154*  BUN 47* 49*  CREATININE 2.86* 2.74*  CALCIUM 8.5* 8.5*     PT/INR:  Lab Results  Component Value Date   INR 1.2 07/10/2021   INR 1.3 (H) 07/08/2021   INR 0.9 07/07/2021   ABG:  INR: Will add last result for INR, ABG once components are confirmed Will add last 4 CBG results once components are confirmed  Assessment/Plan: 1. CV - S/p NSTEMI. PAF-had  MAZE and LA clip at the time of surgery. On Amiodarone 200 mg daily, Coreg 3.125 mg bid, Hydralazine 12.5 mg tid, Plavix 75 mg daily, Imdur 30 mg daily, and Apixaban 5 mg bid. Check ortho statics due to dizziness. 2.  Pulmonary - On room air. Encourage incentive spirometer. 3. AKI on CKD (stage III)-Creatinine this am decreased to 2.74. Good UO. Had tunneled HD catheter removed by IR yesterday. 4.  Expected post op acute blood loss anemia - H and H this am stable at 8 and 26.2. On Aranesp. 5. Acute systolic CHF-Heart failure following 6. DM-CBGs 97/80/72. On Insulin. But will decrease to avoid hypoglycemia. Pre op HGA1C 6.7 7. Will need SNF at discharge   [place] 08/01/2021,7:30 AM  Overall doing well. Making good urine and creatinine trending down. Dispel planning  Billy [name]

## 2021-08-01 NOTE — Plan of Care (Signed)
  Problem: Education: Goal: Knowledge of General Education information will improve Description: Including pain rating scale, medication(s)/side effects and non-pharmacologic comfort measures Outcome: Progressing   Problem: Clinical Measurements: Goal: Ability to maintain clinical measurements within normal limits will improve Outcome: Progressing Goal: Will remain free from infection Outcome: Progressing Goal: Cardiovascular complication will be avoided Outcome: Progressing   Problem: Activity: Goal: Risk for activity intolerance will decrease Outcome: Progressing   Problem: Nutrition: Goal: Adequate nutrition will be maintained Outcome: Progressing   Problem: Elimination: Goal: Will not experience complications related to bowel motility Outcome: Progressing Goal: Will not experience complications related to urinary retention Outcome: Progressing

## 2021-08-01 NOTE — TOC Progression Note (Signed)
Transition of Care [facility]) - Progression Note    Patient Details  Name: Billy [name] MRN: [id number] Date of Birth: 18-Jul-1966  Transition of [facility]) CM/SW Contact  Yumna Ebers, [place] Phone Number: 08/01/2021, 11:56 AM  Clinical Narrative:    HF CSW reached [place] to [place] and spoke to Shirlee Limerick [phone] to see if the insurance authorization has come back yet and Shirlee Limerick reported that it has not and will let the CSW know when it is approved.  CSW will continue to follow throughout discharge.   Expected Discharge Plan: [place] Barriers to Discharge: Continued Medical Work up  Expected Discharge Plan and Services Expected Discharge Plan: [place] In-house Referral: Clinical Social Work Discharge Planning Services: CM Consult Post Acute Care Choice: [place] Living arrangements for the past 2 months: Single Family Home                                       Social Determinants of Health (SDOH) Interventions Food Insecurity Interventions: Intervention Not Indicated Financial Strain Interventions: Intervention Not Indicated Housing Interventions: Intervention Not Indicated Transportation Interventions: Intervention Not Indicated, Other (Comment) (Pt is visually impaired but receives help from family with transportation needs.)  Readmission Risk Interventions No flowsheet data found.  Classie Weng, MSW, [place] Heart Failure Social Worker

## 2021-08-01 NOTE — Progress Notes (Addendum)
Patient ID: Billy [name], male   DOB: [birth year], 55 y.o.   MRN: [id number]     Advanced Heart Failure Rounding Note  PCP-Cardiologist: None   Subjective:    10/25: S/P CABG x4 with MAZE . Intra Op EF 20%.  10/27: Intubated. Hypotensive. Placed on Norepi + Vaso. Recurrent A fib with amio increased to 60 mg/hr.  10/29: Co-ox low, DBA added 11/1: Extubated. AF with RVR, amio gtt restarted 11/03: Dobutamine off 11/6: Started iHD  11/8: R IJ tunneled HD cath placed 11/14: Echo with EF 40-45%, mild LVH, mildly decreased RV systolic function, normal IVC.  11/17: Tunneled HD cath removed  Last HD session 11/12. Continues with good UOP, Scr down further to 2.74. Baseline 1.9 prior to CABG  Progressing well with PT, ambulated 1200 ft several times yesterday. Main concern is intermittent dizziness with ambulation and position changes  BP stable 110s-120s/70s-80s  Received 1 U PRCs 11/09 for Hgb 7.3, Hgb now 7.7>>8.9>>7.9>>8.0>>8.5>>8.0   Objective:   Weight Range: 108.9 kg Body mass index is 30.82 kg/m.   Vital Signs:   Temp:  [97.6 F (36.4 C)-98.6 F (37 C)] 97.6 F (36.4 C) (11/17 2330) Pulse Rate:  [73-86] 80 (11/18 0647) Resp:  [16-21] 21 (11/17 2330) BP: (119-149)/(70-89) 132/89 (11/18 0647) SpO2:  [94 %-99 %] 96 % (11/17 2330) Weight:  [108.9 kg] 108.9 kg (11/18 0526) Last BM Date: 07/30/21 (per pt.)  Weight change: Filed Weights   07/30/21 0420 07/31/21 0423 08/01/21 0526  Weight: 107 kg 112.9 kg 108.9 kg    Intake/Output:   Intake/Output Summary (Last 24 hours) at 08/01/2021 0657 Last data filed at 07/31/2021 2330 Gross per 24 hour  Intake 1560 ml  Output 1475 ml  Net 85 ml      Physical Exam  General:  Well appearing. No distress. HEENT: normal Neck: no JVD. Carotids 2+ bilat; no bruits. Cor: PMI nondisplaced. Regular rate & rhythm. No rubs, gallops or murmurs. Lungs: clear Abdomen: soft, nontender, nondistended. No hepatosplenomegaly. No bruits or  masses. Good bowel sounds. Extremities: no cyanosis, clubbing, rash, edema Neuro: alert & orientedx3, cranial nerves grossly intact. moves all 4 extremities w/o difficulty. Affect pleasant   Telemetry   NSR 60s-70s  Labs    CBC Recent Labs    07/31/21 0334 08/01/21 0108  WBC 5.0 4.7  HGB 8.5* 8.0*  HCT 27.3* 26.2*  MCV 98.6 99.2  PLT 283 469   Basic Metabolic Panel Recent Labs    07/31/21 0334 08/01/21 0108  NA 141 141  K 3.8 4.6  CL 106 107  CO2 28 24  GLUCOSE 90 154*  BUN 47* 49*  CREATININE 2.86* 2.74*  CALCIUM 8.5* 8.5*  MG 1.8 1.8  PHOS 4.3 4.2   Liver Function Tests Recent Labs    07/31/21 0334 08/01/21 0108  ALBUMIN 2.7* 2.6*   No results for input(s): LIPASE, AMYLASE in the last 72 hours. Cardiac Enzymes No results for input(s): CKTOTAL, CKMB, CKMBINDEX, TROPONINI in the last 72 hours.  BNP: BNP (last 3 results) No results for input(s): BNP in the last 8760 hours.  ProBNP (last 3 results) No results for input(s): PROBNP in the last 8760 hours.   D-Dimer No results for input(s): DDIMER in the last 72 hours. Hemoglobin A1C No results for input(s): HGBA1C in the last 72 hours.  Fasting Lipid Panel No results for input(s): CHOL, HDL, LDLCALC, TRIG, CHOLHDL, LDLDIRECT in the last 72 hours.   Thyroid Function Tests No results for  input(s): TSH, T4TOTAL, T3FREE, THYROIDAB in the last 72 hours.  Invalid input(s): FREET3  Other results:   Imaging    IR Removal Tun Cv Cath W/O FL  Result Date: 07/31/2021 INDICATION: History of acute kidney injury with need for hemodialysis. Return of renal function and now no longer needs dialysis catheter. Request for removal. It was initially placed on June 24, 2021 by Dr. Anselm Pancoast. EXAM: REMOVAL OF TUNNELED HEMODIALYSIS CATHETER MEDICATIONS: None COMPLICATIONS: None immediate. PROCEDURE: Informed consent was obtained from the patient following an explanation of the procedure, risks, benefits and  alternatives to treatment. A time out was performed prior to the initiation of the procedure. Using only gentle traction the catheter was removed intact without difficulty. Hemostasis was obtained with manual compression. A dressing was placed. The patient tolerated the procedure well without immediate post procedural complication. IMPRESSION: Successful removal of tunneled dialysis catheter. Read by: Gareth Eagle, PA-C Electronically Signed   By: Jacqulynn Cadet M.D.   On: 07/31/2021 17:05     Medications:     Scheduled Medications:  ALPRAZolam  1 mg Oral TID   amiodarone  200 mg Oral Daily   apixaban  5 mg Oral BID   atorvastatin  80 mg Oral Daily   carvedilol  6.25 mg Oral BID WC   chlorhexidine  15 mL Mouth Rinse BID   Chlorhexidine Gluconate Cloth  6 each Topical Q0600   clopidogrel  75 mg Oral Daily   darbepoetin (ARANESP) injection - NON-DIALYSIS  100 mcg Subcutaneous Q Mon-1800   feeding supplement (NEPRO CARB STEADY)  237 mL Oral BID BM   hydrALAZINE  12.5 mg Oral TID   insulin aspart  0-15 Units Subcutaneous TID WC   insulin detemir  20 Units Subcutaneous BID   isosorbide mononitrate  30 mg Oral Daily   mouth rinse  15 mL Mouth Rinse q12n4p   multivitamin  1 tablet Oral QHS   pantoprazole  40 mg Oral Daily   sertraline  50 mg Oral Daily   sodium chloride flush  3 mL Intravenous Q12H   tamsulosin  0.4 mg Oral QHS   traZODone  100 mg Oral QHS    Infusions:  promethazine (PHENERGAN) injection (IM or IVPB) Stopped (07/09/21 1628)    PRN Medications: acetaminophen, albuterol, docusate, lip balm, metoprolol tartrate, ondansetron (ZOFRAN) IV, oxyCODONE, polyethylene glycol, promethazine (PHENERGAN) injection (IM or IVPB), sennosides, simethicone    Patient Profile   Billy [name] is a 56 year with a history of visually impaired, DM, HTN, depression, anxiety takes 2 mg xanax 3 times a day, SVT, CKD Stage II, PAF, and CAD.Billy [name]   Anterior Stemi--> CABG--> cardiogenic shock.    Assessment/Plan   1. CAD: Admitted with anterolateral MI from occluded diagonal treated with DES.  Patient then had CABG with LIMA-LAD, SVG-D, SVG-PDA, and left radial-OM for complete revascularization on 10/25.  No chest pain.  - Now off ASA given need for Plavix and anticoagulation.  - Continue Plavix 75 daily x 6 months post-PCI.  - atorvastatin 80 mg daily.  - Amlodipine for radial harvest stopped with hypotension while on pressors.    2. Atrial fibrillation: Paroxysmal, s/p Maze and appendage clipping.  He has been maintaining NSR. - Continue PO amio 200 mg daily.  - Eliquis 5 mg bid  3. AKI on CKD stage 3: Baseline creatinine around 1.6, up to 5 post-op.  He has likely had multiple hits including contrast with initial cath/PCI and possible peri-operative hypotension.  Suspect ATN. Stopped  CVVHD 11/4.  11/6 started iHD, 11/08 R IJ tunneled HD catheter placed. Last HD session 11/12.  HD catheter removed 11/17. Scr down further to 2.74. Good UOP.  4. Acute systolic CHF: Ischemic cardiomyopathy.  Echo prior to this admission with EF 50-55% (pre-MI and CABG).  Limited echo on 10/28 showed EF in 25% range. Intraoperative TEE with EF 20%. Off  pressors. Echo 11/14 showed EF improved to 40-45% with mildly decreased RV systolic function.  He is not volume overloaded on exam.  - Continue hydralazine 12.5 mg tid and Imdur 30 mg daily. - Continue coreg 6.25 mg BID - No ARN/SGLT2/spironolactone with AKI - Checking orthostatics due to episodes of dizziness - Titrate GDMT further as outpatient if renal function continues to improve. 5. Anemia: Had 1 unit pRBCs 11/02 + 1 unit 11/9. Hgb 7.3>>8.0>>7.7>>7.9>>8.0>>8.5>.8.0.  No obvious source of bleeding.  - Received IV iron and Aransep 7. Acute hypoxemic respiratory failure: Suspect primarily pulmonary edema, intubated. Possible PNA with bibasilar lung opacities. Extubated, completed course of cefepime, does not appear volume overloaded. O2 stable on room  air. - Completed course of cefepime.  8. Thrombocytopenia: Resolved 9. Right sided weakness: Normal head CT 11/03 10. Urinary Retention: Foley placed 11/7. Foley out 11/13. No difficulty urinating -Continue flomax   Cardiac Medications at discharge: Amiodarone 200 mg daily Eliquis 5 mg BID Plavix 75 mg daily Atorvastatin 80 mg daily Coreg 6.25 mg BID Hydralazine 12.5 mg BID Imdur 30 mg daily  Okay for discharge from HF perspective. Awaiting SNF for rehab. F/u in HF clinic scheduled.  [facility], LINDSAY N 08/01/2021 6:57 AM  Patient seen with PA, agree with the above note.   Creatinine continues to trend down, 2.74 today.  BP stable.    From my standpoint, he is ready for discharge.  Close followup in CHF clinic, will be arranged.  See cardiac med list above.    Cardiology will sign off.   Loralie Champagne 08/01/2021

## 2021-08-02 LAB — CBC
HCT: 26 % — ABNORMAL LOW (ref 39.0–52.0)
Hemoglobin: 8.2 g/dL — ABNORMAL LOW (ref 13.0–17.0)
MCH: 31.3 pg (ref 26.0–34.0)
MCHC: 31.5 g/dL (ref 30.0–36.0)
MCV: 99.2 fL (ref 80.0–100.0)
Platelets: 228 10*3/uL (ref 150–400)
RBC: 2.62 MIL/uL — ABNORMAL LOW (ref 4.22–5.81)
RDW: 14.4 % (ref 11.5–15.5)
WBC: 4.7 10*3/uL (ref 4.0–10.5)
nRBC: 0 % (ref 0.0–0.2)

## 2021-08-02 LAB — GLUCOSE, CAPILLARY
Glucose-Capillary: 128 mg/dL — ABNORMAL HIGH (ref 70–99)
Glucose-Capillary: 200 mg/dL — ABNORMAL HIGH (ref 70–99)
Glucose-Capillary: 261 mg/dL — ABNORMAL HIGH (ref 70–99)
Glucose-Capillary: 238 mg/dL — ABNORMAL HIGH (ref 70–99)

## 2021-08-02 LAB — RENAL FUNCTION PANEL
Albumin: 2.7 g/dL — ABNORMAL LOW (ref 3.5–5.0)
Anion gap: 7 (ref 5–15)
BUN: 46 mg/dL — ABNORMAL HIGH (ref 6–20)
CO2: 26 mmol/L (ref 22–32)
Calcium: 8.6 mg/dL — ABNORMAL LOW (ref 8.9–10.3)
Chloride: 106 mmol/L (ref 98–111)
Creatinine, Ser: 2.62 mg/dL — ABNORMAL HIGH (ref 0.61–1.24)
GFR, Estimated: 28 mL/min — ABNORMAL LOW (ref >60)
Glucose, Bld: 254 mg/dL — ABNORMAL HIGH (ref 70–99)
Phosphorus: 3.9 mg/dL (ref 2.5–4.6)
Potassium: 4.5 mmol/L (ref 3.5–5.1)
Sodium: 139 mmol/L (ref 135–145)

## 2021-08-02 LAB — MAGNESIUM: Magnesium: 1.8 mg/dL (ref 1.7–2.4)

## 2021-08-02 NOTE — Progress Notes (Signed)
25 Days Post-Op Procedure(s) (LRB): CORONARY ARTERY BYPASS GRAFTING (CABG) TIMES FOUR USING LEFT INTERNAL MAMMARY ARTERY, GREATER SAPHENOUS VEIN HARVESTED ENDOSCOPICALLY, AND LEFT RADIAL ARTERY HARVESTED OPEN (N/A) TRANSESOPHAGEAL ECHOCARDIOGRAM (TEE) (N/A) RADIAL ARTERY HARVEST (Left) ENDOVEIN HARVEST OF GREATER SAPHENOUS VEIN (Right) MAZE Subjective: A lot of dizziness last night with walking.  Walked this AM, a little dizziness at end of walk, not orthostatic  Objective: Vital signs in last 24 hours: Temp:  [97.6 F (36.4 C)-98.9 F (37.2 C)] 98.9 F (37.2 C) (11/19 0826) Pulse Rate:  [73-78] 78 (11/19 0826) Cardiac Rhythm: Normal sinus rhythm (11/19 0700) Resp:  [17-20] 18 (11/19 0826) BP: (110-139)/(7-86) 130/80 (11/19 0826) SpO2:  [96 %-98 %] 98 % (11/18 2332) Weight:  [109.3 kg] 109.3 kg (11/19 0603)  Hemodynamic parameters for last 24 hours:    Intake/Output from previous day: 11/18 0701 - 11/19 0700 In: 159 [P.O.:360; NG/GT:474] Out: 1425 [Urine:1425] Intake/Output this shift: No intake/output data recorded.  General appearance: alert, cooperative, and no distress Neurologic: intact Heart: regular rate and rhythm Lungs: clear to auscultation bilaterally Wound: clean and dry  Lab Results: Recent Labs    08/01/21 0108 08/02/21 0054  WBC 4.7 4.7  HGB 8.0* 8.2*  HCT 26.2* 26.0*  PLT 262 228   BMET:  Recent Labs    08/01/21 0108 08/02/21 0054  NA 141 139  K 4.6 4.5  CL 107 106  CO2 24 26  GLUCOSE 154* 254*  BUN 49* 46*  CREATININE 2.74* 2.62*  CALCIUM 8.5* 8.6*    PT/INR: No results for input(s): LABPROT, INR in the last 72 hours. ABG    Component Value Date/Time   PHART 7.459 (H) 07/16/2021 1330   HCO3 24.8 07/16/2021 1330   TCO2 26 07/16/2021 1330   ACIDBASEDEF 9.0 (H) 07/10/2021 0303   O2SAT 38.3 07/23/2021 1447   CBG (last 3)  Recent Labs    08/01/21 1609 08/01/21 2115 08/02/21 0607  GLUCAP 231* 169* 128*     Assessment/Plan: S/P Procedure(s) (LRB): CORONARY ARTERY BYPASS GRAFTING (CABG) TIMES FOUR USING LEFT INTERNAL MAMMARY ARTERY, GREATER SAPHENOUS VEIN HARVESTED ENDOSCOPICALLY, AND LEFT RADIAL ARTERY HARVESTED OPEN (N/A) TRANSESOPHAGEAL ECHOCARDIOGRAM (TEE) (N/A) RADIAL ARTERY HARVEST (Left) ENDOVEIN HARVEST OF GREATER SAPHENOUS VEIN (Right) MAZE POD # 25 CV- in SR, BP OK NEURO- dizziness when walking, no arrhythmias noted  Trending better RESP- continue IS RENAL- creatinine down slightly to 2.62, lytes OK ENDO- CBG elevated after insulin decreased DC planning in progress Continue ambulation  LOS: 26 days    Melrose Nakayama 08/02/2021

## 2021-08-02 NOTE — Plan of Care (Signed)

## 2021-08-02 NOTE — Progress Notes (Signed)
CARDIAC REHAB PHASE I   No walk right now. Talked with the nurse and pt is symptomatic with orthostatic BP and c/o dizziness upon standing. Pt did have additional questions about education material from yesterday. We reviewed some of the information on CRP2 and how to exercise at home until the program starts. Also reviewed sternal precautions and moving in the tube. He is eager to join CRP2. All questions were answered and pt was left in bed with call bell in reach.  [id number] Kirby Funk ACSM-EP 08/02/2021 2:14 PM

## 2021-08-03 LAB — RENAL FUNCTION PANEL
Albumin: 2.7 g/dL — ABNORMAL LOW (ref 3.5–5.0)
Anion gap: 4 — ABNORMAL LOW (ref 5–15)
BUN: 46 mg/dL — ABNORMAL HIGH (ref 6–20)
CO2: 28 mmol/L (ref 22–32)
Calcium: 8.5 mg/dL — ABNORMAL LOW (ref 8.9–10.3)
Chloride: 107 mmol/L (ref 98–111)
Creatinine, Ser: 2.59 mg/dL — ABNORMAL HIGH (ref 0.61–1.24)
GFR, Estimated: 28 mL/min — ABNORMAL LOW (ref >60)
Glucose, Bld: 190 mg/dL — ABNORMAL HIGH (ref 70–99)
Phosphorus: 4.4 mg/dL (ref 2.5–4.6)
Potassium: 4.7 mmol/L (ref 3.5–5.1)
Sodium: 139 mmol/L (ref 135–145)

## 2021-08-03 LAB — GLUCOSE, CAPILLARY
Glucose-Capillary: 119 mg/dL — ABNORMAL HIGH (ref 70–99)
Glucose-Capillary: 172 mg/dL — ABNORMAL HIGH (ref 70–99)
Glucose-Capillary: 165 mg/dL — ABNORMAL HIGH (ref 70–99)
Glucose-Capillary: 142 mg/dL — ABNORMAL HIGH (ref 70–99)

## 2021-08-03 LAB — MAGNESIUM: Magnesium: 1.9 mg/dL (ref 1.7–2.4)

## 2021-08-03 MED ORDER — INSULIN DETEMIR 100 UNIT/ML ~~LOC~~ SOLN
20.0000 [IU] | Freq: Two times a day (BID) | SUBCUTANEOUS | Status: DC
  Administered 2021-08-03 – 2021-08-06 (×7): 20 [IU] via SUBCUTANEOUS
  Filled 2021-08-03 (×9): qty 0.2

## 2021-08-03 MED ORDER — MECLIZINE HCL 12.5 MG PO TABS
12.5000 mg | ORAL_TABLET | Freq: Two times a day (BID) | ORAL | Status: DC | PRN
  Filled 2021-08-03: qty 1

## 2021-08-03 MED ORDER — ISOSORBIDE MONONITRATE ER 30 MG PO TB24
15.0000 mg | ORAL_TABLET | Freq: Every day | ORAL | Status: DC
  Administered 2021-08-03 – 2021-08-06 (×4): 15 mg via ORAL
  Filled 2021-08-03 (×4): qty 1

## 2021-08-03 NOTE — Plan of Care (Signed)

## 2021-08-03 NOTE — Progress Notes (Signed)
Mobility Specialist: Progress Note   08/03/21 1528  Mobility  Activity Ambulated in hall  Level of Assistance Independent  Assistive Device None  Distance Ambulated (ft) 800 ft  Mobility Ambulated independently in hallway  Mobility Response Tolerated fair  Mobility performed by Mobility specialist  $Mobility charge 1 Mobility   Post-Mobility: 81 HR, 122/74 BP, 96% SpO2  Pt c/o feeling a little dizzy during ambulation requiring x2 standing breaks, otherwise no c/o. Pt sitting EOB after walk with call bell at his side, VSS.   [facility] Zareth Rippetoe Mobility Specialist Mobility Specialist Phone #1: [phone] Mobility Specialist Phone #2: [phone]

## 2021-08-03 NOTE — Progress Notes (Addendum)
      [address]       , [address]             [phone]      26 Days Post-Op Procedure(s) (LRB): CORONARY ARTERY BYPASS GRAFTING (CABG) TIMES FOUR USING LEFT INTERNAL MAMMARY ARTERY, GREATER SAPHENOUS VEIN HARVESTED ENDOSCOPICALLY, AND LEFT RADIAL ARTERY HARVESTED OPEN (N/A) TRANSESOPHAGEAL ECHOCARDIOGRAM (TEE) (N/A) RADIAL ARTERY HARVEST (Left) ENDOVEIN HARVEST OF GREATER SAPHENOUS VEIN (Right) MAZE  Subjective:  Patient with some nausea/vomiting this morning.  Continues to have dizziness when getting up and ambulating.  + BM  Objective: Vital signs in last 24 hours: Temp:  [98.1 F (36.7 C)-98.9 F (37.2 C)] 98.1 F (36.7 C) (11/19 2353) Pulse Rate:  [70-88] 70 (11/19 2353) Cardiac Rhythm: Normal sinus rhythm (11/20 0700) Resp:  [18-22] 20 (11/19 2353) BP: (116-130)/(68-80) 127/78 (11/19 2353) SpO2:  [95 %] 95 % (11/19 2353) Weight:  [111.2 kg] 111.2 kg (11/20 0500)  Intake/Output from previous day: 11/19 0701 - 11/20 0700 In: 960 [P.O.:960] Out: 2025 [Urine:2025]  General appearance: alert, cooperative, and no distress Heart: regular rate and rhythm Lungs: clear to auscultation bilaterally Abdomen: soft, non-tender; bowel sounds normal; no masses,  no organomegaly Extremities: extremities normal, atraumatic, no cyanosis or edema Wound: clean and dry, healing w/o s/s of infection  Lab Results: Recent Labs    08/01/21 0108 08/02/21 0054  WBC 4.7 4.7  HGB 8.0* 8.2*  HCT 26.2* 26.0*  PLT 262 228   BMET:  Recent Labs    08/02/21 0054 08/03/21 0049  NA 139 139  K 4.5 4.7  CL 106 107  CO2 26 28  GLUCOSE 254* 190*  BUN 46* 46*  CREATININE 2.62* 2.59*  CALCIUM 8.6* 8.5*    PT/INR: No results for input(s): LABPROT, INR in the last 72 hours. ABG    Component Value Date/Time   PHART 7.459 (H) 07/16/2021 1330   HCO3 24.8 07/16/2021 1330   TCO2 26 07/16/2021 1330   ACIDBASEDEF 9.0 (H) 07/10/2021 0303   O2SAT 38.3 07/23/2021  1447   CBG (last 3)  Recent Labs    08/02/21 1626 08/02/21 2113 08/03/21 0610  GLUCAP 261* 238* 119*    Assessment/Plan: S/P Procedure(s) (LRB): CORONARY ARTERY BYPASS GRAFTING (CABG) TIMES FOUR USING LEFT INTERNAL MAMMARY ARTERY, GREATER SAPHENOUS VEIN HARVESTED ENDOSCOPICALLY, AND LEFT RADIAL ARTERY HARVESTED OPEN (N/A) TRANSESOPHAGEAL ECHOCARDIOGRAM (TEE) (N/A) RADIAL ARTERY HARVEST (Left) ENDOVEIN HARVEST OF GREATER SAPHENOUS VEIN (Right) MAZE  CV- NSR, some orthostasis with ambulation- on Coreg at 6.25, Hydralazine 12.5 mg TID, will decrease Imdur to 15 mg daily, will stop Amiodarone Pulm- off oxygen no acute issues continue IS Renal- creatinine continues to improve, K is at 4.7, good u/o DM- sugars remain elevated, will increase Levemir to 20 units BID  Deconditioning- mainly limited due to orthostasis, however patient is willing to walk, continue PT/OT Dispo- patient stable, making great improvement, continue current care, will adjust BP medications as needed   LOS: 27 days    Ellwood Handler, PA-C 08/03/2021 Patient seen and examined, agree with above  Remo Lipps C. Roxan Hockey, MD [facility] [phone]

## 2021-08-04 LAB — RENAL FUNCTION PANEL
Albumin: 2.8 g/dL — ABNORMAL LOW (ref 3.5–5.0)
Anion gap: 5 (ref 5–15)
BUN: 45 mg/dL — ABNORMAL HIGH (ref 6–20)
CO2: 26 mmol/L (ref 22–32)
Calcium: 8.4 mg/dL — ABNORMAL LOW (ref 8.9–10.3)
Chloride: 106 mmol/L (ref 98–111)
Creatinine, Ser: 2.41 mg/dL — ABNORMAL HIGH (ref 0.61–1.24)
GFR, Estimated: 31 mL/min — ABNORMAL LOW (ref >60)
Glucose, Bld: 131 mg/dL — ABNORMAL HIGH (ref 70–99)
Phosphorus: 4 mg/dL (ref 2.5–4.6)
Potassium: 4.8 mmol/L (ref 3.5–5.1)
Sodium: 137 mmol/L (ref 135–145)

## 2021-08-04 LAB — GLUCOSE, CAPILLARY
Glucose-Capillary: 81 mg/dL (ref 70–99)
Glucose-Capillary: 164 mg/dL — ABNORMAL HIGH (ref 70–99)
Glucose-Capillary: 204 mg/dL — ABNORMAL HIGH (ref 70–99)
Glucose-Capillary: 169 mg/dL — ABNORMAL HIGH (ref 70–99)

## 2021-08-04 LAB — BASIC METABOLIC PANEL
Anion gap: 6 (ref 5–15)
BUN: 45 mg/dL — ABNORMAL HIGH (ref 6–20)
CO2: 27 mmol/L (ref 22–32)
Calcium: 8.5 mg/dL — ABNORMAL LOW (ref 8.9–10.3)
Chloride: 106 mmol/L (ref 98–111)
Creatinine, Ser: 2.48 mg/dL — ABNORMAL HIGH (ref 0.61–1.24)
GFR, Estimated: 30 mL/min — ABNORMAL LOW (ref >60)
Glucose, Bld: 134 mg/dL — ABNORMAL HIGH (ref 70–99)
Potassium: 4.8 mmol/L (ref 3.5–5.1)
Sodium: 139 mmol/L (ref 135–145)

## 2021-08-04 LAB — MAGNESIUM: Magnesium: 1.8 mg/dL (ref 1.7–2.4)

## 2021-08-04 NOTE — TOC Progression Note (Addendum)
Transition of Care [facility]) - Progression Note    Patient Details  Name: Billy [name] MRN: [id number] Date of Birth: 11/21/1965  [facility] CM/SW [place], [place] Phone Number: 08/04/2021, 12:05 PM  Clinical Narrative:    HF CSW notified attending MD that the insurance authorization was denied and went to peer to peer and to call before 12pm today, 08/04/21 [phone] option 3 reference #:[id number] subscriber ID#: [id number].  It is now past 12pm with no call for the peer to peer. CSW notified RNCM about [place] since insurance was denied.   CSW spoke with [place] to see if the insurance provided an appeal number for the family and Shirlee Limerick reported that they didn't. CSW is on hold with [place] [phone] to see if appealing is an option and they provided the number for Medicare appeal [phone].  CSW spoke with the patients sister [name] [phone] about the insurance being denied and if she would like to appeal and discussing options. [name] reported she would like to talk with her sister first and will let the CSW know. [name] would like a medical update from the treatment team about her brother as she hasn't heard anything other than that he has been dizzy when walking and doesn't want him to fall at home and break something. [name] reported that they would like to find easy meal prep for Mr. Kreider and transportation to get him to appointments and CSW did put in a referral to [facility] for meals on wheels.  CSW enrolled Mr. Fuhrmann in [facility] Transportation for [facility] related appointments.  CSW will continue to follow throughout discharge.   Expected Discharge Plan: [place] Barriers to Discharge: Continued Medical Work up  Expected Discharge Plan and Services Expected Discharge Plan: [place] In-house Referral: Clinical Social Work Discharge Planning Services: CM Consult Post Acute Care Choice: [place] Living  arrangements for the past 2 months: Single Family Home                                       Social Determinants of Health (SDOH) Interventions Food Insecurity Interventions: Intervention Not Indicated Financial Strain Interventions: Intervention Not Indicated Housing Interventions: Intervention Not Indicated Transportation Interventions: Intervention Not Indicated, Other (Comment) (Pt is visually impaired but receives help from family with transportation needs.)  Readmission Risk Interventions No flowsheet data found.  Barbee Mamula, MSW, [place] Heart Failure Social Worker

## 2021-08-04 NOTE — Progress Notes (Addendum)
[address]       [place] [address]             [phone]      27 Days Post-Op Procedure(s) (LRB): CORONARY ARTERY BYPASS GRAFTING (CABG) TIMES FOUR USING LEFT INTERNAL MAMMARY ARTERY, GREATER SAPHENOUS VEIN HARVESTED ENDOSCOPICALLY, AND LEFT RADIAL ARTERY HARVESTED OPEN (N/A) TRANSESOPHAGEAL ECHOCARDIOGRAM (TEE) (N/A) RADIAL ARTERY HARVEST (Left) ENDOVEIN HARVEST OF GREATER SAPHENOUS VEIN (Right) MAZE Subjective: Less dizzy yesterday  Objective: Vital signs in last 24 hours: Temp:  [97.7 F (36.5 C)-98.7 F (37.1 C)] 98.1 F (36.7 C) (11/21 0430) Pulse Rate:  [72-76] 76 (11/21 0648) Cardiac Rhythm: Normal sinus rhythm (11/21 0721) Resp:  [17-20] 18 (11/21 0430) BP: (106-142)/(69-92) 134/84 (11/21 0648) SpO2:  [92 %-96 %] 92 % (11/21 0430) Weight:  [110.2 kg] 110.2 kg (11/21 0440)  Hemodynamic parameters for last 24 hours:    Intake/Output from previous day: 11/20 0701 - 11/21 0700 In: 6 [I.V.:6] Out: 1800 [Urine:1800] Intake/Output this shift: No intake/output data recorded.  General appearance: alert, cooperative, and no distress Heart: regularly irregular rhythm Lungs: clear to auscultation bilaterally Abdomen: benign Extremities: + edema BLE Wound: healing well  Lab Results: Recent Labs    08/02/21 0054  WBC 4.7  HGB 8.2*  HCT 26.0*  PLT 228   BMET:  Recent Labs    08/03/21 0049 08/04/21 0026  NA 139 139  137  K 4.7 4.8  4.8  CL 107 106  106  CO2 28 27  26   GLUCOSE 190* 134*  131*  BUN 46* 45*  45*  CREATININE 2.59* 2.48*  2.41*  CALCIUM 8.5* 8.5*  8.4*    PT/INR: No results for input(s): LABPROT, INR in the last 72 hours. ABG    Component Value Date/Time   PHART 7.459 (H) 07/16/2021 1330   HCO3 24.8 07/16/2021 1330   TCO2 26 07/16/2021 1330   ACIDBASEDEF 9.0 (H) 07/10/2021 0303   O2SAT 38.3 07/23/2021 1447   CBG (last 3)  Recent Labs    08/03/21 1607 08/03/21 2059 08/04/21 0639  GLUCAP 165*  142* 81    Meds Scheduled Meds:  ALPRAZolam  1 mg Oral TID   apixaban  5 mg Oral BID   atorvastatin  80 mg Oral Daily   carvedilol  6.25 mg Oral BID WC   chlorhexidine  15 mL Mouth Rinse BID   clopidogrel  75 mg Oral Daily   darbepoetin (ARANESP) injection - NON-DIALYSIS  100 mcg Subcutaneous Q Mon-1800   feeding supplement (NEPRO CARB STEADY)  237 mL Oral BID BM   hydrALAZINE  12.5 mg Oral TID   insulin aspart  0-15 Units Subcutaneous TID WC   insulin detemir  20 Units Subcutaneous BID   isosorbide mononitrate  15 mg Oral Daily   mouth rinse  15 mL Mouth Rinse q12n4p   multivitamin  1 tablet Oral QHS   pantoprazole  40 mg Oral Daily   sertraline  50 mg Oral Daily   sodium chloride flush  3 mL Intravenous Q12H   tamsulosin  0.4 mg Oral QHS   traZODone  100 mg Oral QHS   Continuous Infusions:  promethazine (PHENERGAN) injection (IM or IVPB) Stopped (07/09/21 1628)   PRN Meds:.acetaminophen, albuterol, docusate, lip balm, meclizine, metoprolol tartrate, ondansetron (ZOFRAN) IV, oxyCODONE, polyethylene glycol, promethazine (PHENERGAN) injection (IM or IVPB), sennosides, simethicone  Xrays No results found.  Assessment/Plan: S/P Procedure(s) (LRB): CORONARY ARTERY BYPASS GRAFTING (CABG) TIMES FOUR  USING LEFT INTERNAL MAMMARY ARTERY, GREATER SAPHENOUS VEIN HARVESTED ENDOSCOPICALLY, AND LEFT RADIAL ARTERY HARVESTED OPEN (N/A) TRANSESOPHAGEAL ECHOCARDIOGRAM (TEE) (N/A) RADIAL ARTERY HARVEST (Left) ENDOVEIN HARVEST OF GREATER SAPHENOUS VEIN (Right) MAZE   1 afeb, VSS 2 sats ok on RA 3 conts with good UOP 4 BUN/Creat cont improved trend 5 BS control is reasonable- cont levemir 6 SNF at D/c not sure if peer-peer was done   LOS: 28 days    Tristain Giovanni PA-C Pager [phone] 08/04/2021   Overall doing well. [place]

## 2021-08-04 NOTE — Progress Notes (Signed)
CARDIAC REHAB PHASE I   PRE:  Rate/Rhythm: 72 SR    BP: sitting 121/77, standing 111/69    SaO2: 99 RA  MODE:  Ambulation: 750 ft (170, 580 ft), hall BP 111/71  POST:  Rate/Rhythm: 79 SR    BP: sitting 106/72     SaO2: 97 RA  Pt stood and BP taken, stable, slight dizziness but "im ok". Ambulated 150 ft and c/o increased dizziness. Rested and felt some better but still imbalanced, requiring increased assist to ambulate back to room. Pt was able to maintain standing and BP 111/71 (no change). Sat and rested then able to walk with rollator for support 580 more feet. Some dizziness per pt. Also c/o SOB that he is trying to get used to. Pt generally anxious but motivated. Plans to walk with MT later.  [id number]  [place], ACSM 08/04/2021 11:04 AM

## 2021-08-04 NOTE — Progress Notes (Signed)
Mobility Specialist Progress Note    08/04/21 1700  Orthostatic Lying   BP- Lying 131/77  Orthostatic Sitting  BP- Sitting 125/80  Orthostatic Standing at 0 minutes  BP- Standing at 0 minutes 97/64  Orthostatic Standing at 3 minutes  BP- Standing at 3 minutes 113/76  Mobility  Activity Ambulated in hall  Level of Assistance Independent  Assistive Device None  Distance Ambulated (ft) 810 ft  Mobility Ambulated independently in hallway  Mobility Response Tolerated well  Mobility performed by Mobility specialist  $Mobility charge 1 Mobility   Pt received in bed and agreeable. C/o dizziness but stated it was less than earlier today. Took x2 short standing breaks. Would pick up pace throughout walk and said it was because he is a naturally fast walker. Returned to sitting EOB with call bell in reach.   [facility] Mobility Specialist  M.S. Primary Phone: [phone] M.S. Secondary Phone: [phone]

## 2021-08-05 LAB — RENAL FUNCTION PANEL
Albumin: 2.8 g/dL — ABNORMAL LOW (ref 3.5–5.0)
Anion gap: 7 (ref 5–15)
BUN: 43 mg/dL — ABNORMAL HIGH (ref 6–20)
CO2: 25 mmol/L (ref 22–32)
Calcium: 8.6 mg/dL — ABNORMAL LOW (ref 8.9–10.3)
Chloride: 108 mmol/L (ref 98–111)
Creatinine, Ser: 2.33 mg/dL — ABNORMAL HIGH (ref 0.61–1.24)
GFR, Estimated: 32 mL/min — ABNORMAL LOW (ref >60)
Glucose, Bld: 98 mg/dL (ref 70–99)
Phosphorus: 4.4 mg/dL (ref 2.5–4.6)
Potassium: 4.6 mmol/L (ref 3.5–5.1)
Sodium: 140 mmol/L (ref 135–145)

## 2021-08-05 LAB — MAGNESIUM: Magnesium: 1.8 mg/dL (ref 1.7–2.4)

## 2021-08-05 LAB — GLUCOSE, CAPILLARY
Glucose-Capillary: 82 mg/dL (ref 70–99)
Glucose-Capillary: 185 mg/dL — ABNORMAL HIGH (ref 70–99)
Glucose-Capillary: 156 mg/dL — ABNORMAL HIGH (ref 70–99)
Glucose-Capillary: 176 mg/dL — ABNORMAL HIGH (ref 70–99)

## 2021-08-05 MED ORDER — DARBEPOETIN ALFA 100 MCG/0.5ML IJ SOSY
100.0000 ug | PREFILLED_SYRINGE | Freq: Once | INTRAMUSCULAR | Status: AC
  Administered 2021-08-05: 100 ug via SUBCUTANEOUS
  Filled 2021-08-05 (×2): qty 0.5

## 2021-08-05 MED ORDER — MIDODRINE HCL 5 MG PO TABS
5.0000 mg | ORAL_TABLET | Freq: Three times a day (TID) | ORAL | Status: DC
  Administered 2021-08-05 – 2021-08-06 (×3): 5 mg via ORAL
  Filled 2021-08-05 (×3): qty 1

## 2021-08-05 NOTE — Progress Notes (Signed)
Physical Therapy Treatment & Discharge Patient Details Name: Billy [name] MRN: [id number] DOB: 1965-12-26 Today's Date: 08/05/2021   History of Present Illness Pt is a 55 y.o. male admitted 07/07/21 with STEMI. S/p LHC with PCA. S/p CABGx4 with MAZE 10/25. Extubated post-op on 10/26; reintubated 10/27-11/1. On CRRT 10/27-11/4. iHD initiated 11/6. S/p R IJ tunneled HD cath placement 11/8. PMH includes visual impairment, afib, HTN, DM, R toe amputation, neuropathy, anxiety.   PT Comments    Pt progressing well with mobility; independent with ambulation, stairs, and ADL tasks. Pt denies dizziness with activity this session. Dynamic Gait Index score of 23/24 does not indicate increased risk for falls with higher level balance tasks. Pt preparing for d/c home this week; reviewed educ, pt reports no questions or concerns. Will d/c acute PT.    Recommendations for follow up therapy are one component of a multi-disciplinary discharge planning process, led by the attending physician.  Recommendations may be updated based on patient status, additional functional criteria and insurance authorization.  Follow Up Recommendations  Home health PT (insurance declined SNF)     Assistance Recommended at Discharge PRN  Equipment Recommendations  None recommended by PT    Recommendations for Other Services       Precautions / Restrictions Precautions Precautions: Sternal;Fall     Mobility  Bed Mobility Overal bed mobility: Independent             General bed mobility comments: bed flat    Transfers Overall transfer level: Independent                      Ambulation/Gait Ambulation/Gait assistance: Independent Gait Distance (Feet): 650 Feet Assistive device: None Gait Pattern/deviations: WFL(Within Functional Limits)           Stairs Stairs: Yes Stairs assistance: Modified independent (Device/Increase time) Stair Management: One rail Right;Two rails;Alternating  pattern;Forwards Number of Stairs: 10     Wheelchair Mobility    Modified Rankin (Stroke Patients Only)       Balance Overall balance assessment: Independent Sitting-balance support: No upper extremity supported;Feet supported Sitting balance-Leahy Scale: Good     Standing balance support: No upper extremity supported;During functional activity Standing balance-Leahy Scale: Good                   Standardized Balance Assessment Standardized Balance Assessment : Dynamic Gait Index   Dynamic Gait Index Level Surface: Normal Change in Gait Speed: Normal Gait with Horizontal Head Turns: Normal Gait with Vertical Head Turns: Normal Gait and Pivot Turn: Normal Step Over Obstacle: Normal Step Around Obstacles: Normal Steps: Mild Impairment Total Score: 23      Cognition Arousal/Alertness: Awake/alert Behavior During Therapy: WFL for tasks assessed/performed Overall Cognitive Status: Within Functional Limits for tasks assessed                                          Exercises      General Comments        Pertinent Vitals/Pain Pain Assessment: No/denies pain    Home Living                          Prior Function            PT Goals (current goals can now be found in the care plan section) Progress towards PT goals:  Goals met/education completed, patient discharged from PT    Frequency    Min 1X/week      PT Plan Discharge plan needs to be updated    Co-evaluation              AM-PAC PT "6 Clicks" Mobility   Outcome Measure  Help needed turning from your back to your side while in a flat bed without using bedrails?: None Help needed moving from lying on your back to sitting on the side of a flat bed without using bedrails?: None Help needed moving to and from a bed to a chair (including a wheelchair)?: None Help needed standing up from a chair using your arms (e.g., wheelchair or bedside chair)?:  None Help needed to walk in hospital room?: None Help needed climbing 3-5 steps with a railing? : None 6 Click Score: 24    End of Session   Activity Tolerance: Patient tolerated treatment well Patient left: in bed;with call bell/phone within reach;with nursing/sitter in room Nurse Communication: Mobility status PT Visit Diagnosis: Muscle weakness (generalized) (M62.81)     Time: 9476-5465 PT Time Calculation (min) (ACUTE ONLY): 16 min  Charges:  $Therapeutic Exercise: 8-22 mins                     Mabeline Caras, PT, DPT Acute Rehabilitation Services  Pager [phone] Office [place] 08/05/2021, 5:19 PM

## 2021-08-05 NOTE — TOC Initial Note (Signed)
Transition of Care [facility]) - Initial/Assessment Note    Patient Details  Name: Billy [name] MRN: [id number] Date of Birth: [birth year]  [facility] CM/SW Contact:    Erenest Rasher, RN Phone Number: [phone] 08/05/2021, 4:33 PM  Clinical Narrative:                 HF TOC CM spoke to pt a bedside. Pt states he lives alone but his sister is very supportive. Offered choice for [facility] and pt agreeable to [place] for [place]. Contacted [facility] rep, [place] with new referral. [facility] accepted referral. [place] for rolling walker for seat for home. [place] orders in Epic.   Expected Discharge Plan: [place] Barriers to Discharge: Continued Medical Work up   Patient Goals and CMS Choice Patient states their goals for this hospitalization and ongoing recovery are:: remain independent CMS Medicare.gov Compare Post Acute Care list provided to:: Patient Choice offered to / list presented to : Patient, Sibling  Expected Discharge Plan and Services Expected Discharge Plan: [place] In-house Referral: Clinical Social Work Discharge Planning Services: CM Consult Post Acute Care Choice: [place] arrangements for the past 2 months: [place]                   DME Agency: [employer] Date DME Agency Contacted: 08/05/21 Time DME Agency Contacted: [phone] Representative spoke with at DME Agency: Melina Modena HH Arranged: RN, PT [facility] Agency: [place] Date [place]: 08/05/21 Time HH Agency Contacted: 42 Representative spoke with at [place]: Freddi Starr  Prior Living Arrangements/Services Living arrangements for the past 2 months: Single Family Home Lives with:: Self Patient language and need for interpreter reviewed:: Yes Do you feel safe going back to the place where you live?: Yes      Need for Family Participation in Patient Care: No (Comment) Care giver support system in  place?: No (comment)   Criminal Activity/Legal Involvement Pertinent to Current Situation/Hospitalization: No - Comment as needed  Activities of Daily Living Home Assistive Devices/Equipment: None ADL Screening (condition at time of admission) Patient's cognitive ability adequate to safely complete daily activities?: Yes Is the patient deaf or have difficulty hearing?: No Does the patient have difficulty seeing, even when wearing glasses/contacts?: No Does the patient have difficulty concentrating, remembering, or making decisions?: No Patient able to express need for assistance with ADLs?: Yes Does the patient have difficulty dressing or bathing?: No Independently performs ADLs?: Yes (appropriate for developmental age) Does the patient have difficulty walking or climbing stairs?: No Weakness of Legs: None Weakness of Arms/Hands: None  Permission Sought/Granted Permission sought to share information with : Case Manager, Family Supports, PCP Permission granted to share information with : Yes, Verbal Permission Granted  Share Information with NAME: [name]  Permission granted to share info w AGENCY: SNF's  Permission granted to share info w Relationship: sister  Permission granted to share info w Contact Information: [phone]  Emotional Assessment Appearance:: Appears stated age Attitude/Demeanor/Rapport: Engaged Affect (typically observed): Pleasant Orientation: : Oriented to Self, Oriented to Place, Oriented to  Time, Oriented to Situation   Psych Involvement: No (comment)  Admission diagnosis:  STEMI (ST elevation myocardial infarction) ([place]) [I21.3] Atrial fibrillation with RVR ([place]) [I48.91] ST elevation myocardial infarction involving left anterior descending (LAD) coronary artery ([place]) [I21.02] S/P CABG x 4 [Z95.1] Patient Active Problem List   Diagnosis Date Noted   Pressure injury  of skin 07/18/2021   Cardiogenic shock (HCC)    AKI (acute kidney injury) ([place])     Acute respiratory failure with hypoxia (HCC)    S/P CABG x 4 07/08/2021   STEMI (ST elevation myocardial infarction) ([place]) 07/07/2021   Exertional dyspnea 06/30/2021   SVT (supraventricular tachycardia) ([place]) 06/30/2021   Chest pain 06/25/2021   Renal insufficiency 06/25/2021   Hypertensive urgency 06/25/2021   S/P amputation of lesser toe, right ([place]) 07/13/2017   Chronic osteomyelitis of toe, right ([place])    Anxiety and depression 06/30/2017   Left shoulder pain 06/30/2017   Marijuana dependence ([place]) 06/30/2017   Diabetes mellitus with ulcer of toe ([place]) 06/05/2012    Class: Acute   Hypertension 06/05/2012    Class: Chronic   PCP:  Reynold Bowen, MD Pharmacy:   [id number] - [place], [address] [address] Phone: [phone] Fax: [phone]     Social Determinants of Health (SDOH) Interventions Food Insecurity Interventions: Intervention Not Indicated Financial Strain Interventions: Intervention Not Indicated Housing Interventions: Intervention Not Indicated Transportation Interventions: Intervention Not Indicated, Other (Comment) (Pt is visually impaired but receives help from family with transportation needs.)  Readmission Risk Interventions No flowsheet data found.

## 2021-08-05 NOTE — Progress Notes (Signed)
Pt shaving at sink. Voices his concerns over d/c tomorrow versus Friday (sister is having a thanksgiving party). He has ambulated x2 today and plans to again with MT.  1350-1400 Yves Dill CES, ACSM 2:03 PM 08/05/2021

## 2021-08-05 NOTE — Progress Notes (Signed)
Nutrition Follow-up  DOCUMENTATION CODES:   Not applicable  INTERVENTION:  - Encourage PO intake  - Continue Nepro Shake po BID, each supplement provides 425 kcal and 19 grams protein  - Continue renal MVI daily  - Provided nutrition education on balanced nutrition and how to make appropriate food choices when grocery shopping  NUTRITION DIAGNOSIS:   Increased nutrient needs related to acute illness (AKI requiring CRRT) as evidenced by estimated needs.  On-going, pt no longer requiring CRRT  GOAL:   Patient will meet greater than or equal to 90% of their needs  Goal met; meeting needs via meals and nutrition supplementation  MONITOR:   PO intake, Supplement acceptance, Labs, Weight trends, Skin, I & O's  REASON FOR ASSESSMENT:   Ventilator, Consult Enteral/tube feeding initiation and management  ASSESSMENT:   55-year-old male who presented to the ED on 10/24 with STEMI. PMH of atrial fibrillation, HTN, T2DM, depression, anxiety, CKD stage II.  10/24 - s/p LHC with PCI, found to have severe multivessel CAD 10/25 - s/p CABG x 4, Maze 10/26 - extubated 10/27 - intubated, CRRT start, trickle tube feeds started 10/30 - trickle tube feeds on hold due to concern for reflux of tube feeds 10/31 - Cortrak placed (tip post-pyloric), trickle tube feeds restarted, rectal tube placed 11/01 - extubated 11/03 - full liquid diet 11/04 - regular diet, Cortrak pulled back to 88 cm, tube feeding rate halved, CRRT stop 11/05 - Cortrak removed, first iHD 11/07 - transferred out of ICU 11/08 - s/p tunneled HD catheter placement   Noted pt with continued symptomatic orthostasis and c/o dizziness. Plans for possible d/c soon if orthostasis/dizziness continues to improve.   Pt reports eating really well. His diet was liberalized during last visit to carb modified and he is pleased with this and states that he is finding more options that he enjoys. Pt continue to eat 90-100% of meals.    He reports having 1 episode of n/v yesterday morning after taking medication on an empty stomach and lying down but this has improved and has no further complaints.  Addressed all of his nutrition related questions regarding saturated fats, sodium, and overall balanced nutrition after discharge. Informed him of nutrition information added to AVS.  Admit weight: 115.7 kg 11/15 weight: 109 kg Current weight: 110.1 kg  Medications: SSI, levemir 20 units BID, rena-vit, protonix   Labs: BUN 45, Cr 2.48, CBGs: 81-204 x24 hours UOP: -1900mL x12 hours  Diet Order:   Diet Order             Diet Carb Modified Fluid consistency: Thin; Room service appropriate? Yes; Fluid restriction: 1200 mL Fluid  Diet effective now                   EDUCATION NEEDS:   Education needs have been addressed  Skin:  Skin Assessment: Skin Integrity Issues: Skin Integrity Issues:: Stage II, Incisions Stage II: R buttocks Incisions: chest, L leg, R arm  Last BM:  08/04/21  Height:   Ht Readings from Last 1 Encounters:  08/01/21 6' 2" (1.88 m)    Weight:   Wt Readings from Last 1 Encounters:  08/05/21 110.1 kg    Ideal Body Weight:  86.4 kg  BMI:  Body mass index is 31.16 kg/m.  Estimated Nutritional Needs:   Kcal:  2200-2400  Protein:  110-120g  Fluid:  >2.2L  [place]ie , RDN, LDN Clinical Nutrition 

## 2021-08-05 NOTE — Progress Notes (Signed)
Occupational Therapy Treatment Patient Details Name: Billy [name] MRN: [id number] DOB: [birth year] Today's Date: 08/05/2021   History of present illness Pt is a 55 y.o. male admitted 07/07/21 with STEMI. S/p LHC with PCA. S/p CABGx4 with MAZE 10/25. Extubated post-op on 10/26; reintubated 10/27-11/1. On CRRT 10/27-11/4. iHD initiated 11/6. S/p R IJ tunneled HD cath placement 11/8. PMH includes visual impairment, afib, HTN, DM, R toe amputation, neuropathy, anxiety.   OT comments  Patient continues to make steady progress towards goals in skilled OT session. Patient's session encompassed  functional mobility to complete household ambulation, energy conservation strategies, and higher level problem solving to complete IADLs upon discharge. Patient is very motivated to get home and participate with therapy, and is able to verbalize energy conservation strategies for showering and meal prep. Patient with need for two short standing rest breaks with ambulation, with O2 remaining at 95 and HR 78. Patient recommendation updated to [facility] due to current level of independence. Therapy will continue to follow in house.    Recommendations for follow up therapy are one component of a multi-disciplinary discharge planning process, led by the attending physician.  Recommendations may be updated based on patient status, additional functional criteria and insurance authorization.    Follow Up Recommendations  Home health OT    Assistance Recommended at Discharge Intermittent Supervision/Assistance  Equipment Recommendations  None recommended by OT    Recommendations for Other Services      Precautions / Restrictions Precautions Precautions: Sternal;Fall Precaution Booklet Issued: Yes (comment) Precaution Comments: reinforced sternal precautions related to IADL, verbalized and demonstrated appropriately Restrictions Other Position/Activity Restrictions: sternal precautions       Mobility Bed  Mobility Overal bed mobility: Independent             General bed mobility comments: sitting up EOB    Transfers Overall transfer level: Independent Equipment used: None Transfers: Sit to/from Stand Sit to Stand: Independent                 Balance Overall balance assessment: Mild deficits observed, not formally tested Sitting-balance support: No upper extremity supported;Feet supported Sitting balance-Leahy Scale: Good Sitting balance - Comments: Pt able to static sit EOB without physical assist; did not challenge sitting balance   Standing balance support: No upper extremity supported;During functional activity Standing balance-Leahy Scale: Good Standing balance comment: patient minimally leaning to R when ambulation, able to correct with cues, continued to appear when patient more fatigued                           ADL either performed or assessed with clinical judgement   ADL Overall ADL's : Modified independent                         Toilet Transfer: Supervision/safety;Ambulation Toilet Transfer Details (indicate cue type and reason): simulated with transfers         Functional mobility during ADLs: Modified independent General ADL Comments: patient at mod I level for ADLs, focusing on higher level IADLs and energy conservation to promote a safe discharge home    Extremity/Trunk Assessment              Vision       Perception     Praxis      Cognition Arousal/Alertness: Awake/alert Behavior During Therapy: WFL for tasks assessed/performed Overall Cognitive Status: Within Functional Limits for tasks assessed  General Comments: motivated and able to complete minimal higher level planning for discharge home          Exercises     Shoulder Instructions       General Comments      Pertinent Vitals/ Pain       Pain Assessment: No/denies pain  Home Living                                           Prior Functioning/Environment              Frequency  Min 2X/week        Progress Toward Goals  OT Goals(current goals can now be found in the care plan section)  Progress towards OT goals: Progressing toward goals  Acute Rehab OT Goals OT Goal Formulation: With patient Time For Goal Achievement: 08/13/21 Potential to Achieve Goals: Good  Plan Discharge plan needs to be updated    Co-evaluation                 AM-PAC OT "6 Clicks" Daily Activity     Outcome Measure   Help from another person eating meals?: None Help from another person taking care of personal grooming?: None Help from another person toileting, which includes using toliet, bedpan, or urinal?: A Little Help from another person bathing (including washing, rinsing, drying)?: A Little Help from another person to put on and taking off regular upper body clothing?: None Help from another person to put on and taking off regular lower body clothing?: A Little 6 Click Score: 21    End of Session    OT Visit Diagnosis: Muscle weakness (generalized) (M62.81);Other symptoms and signs involving cognitive function;Pain   Activity Tolerance Patient tolerated treatment well   Patient Left in bed;with call bell/phone within reach (sitting EOB)   Nurse Communication Mobility status        Time: 8937-3428 OT Time Calculation (min): 29 min  Charges: OT General Charges $OT Visit: 1 Visit OT Treatments $Self Care/Home Management : 23-37 mins  [place]. Kynley Metzger, COTA/L Acute Rehabilitation Services [phone] [place] 08/05/2021, 12:06 PM

## 2021-08-05 NOTE — TOC Progression Note (Addendum)
Transition of Care [facility]) - Progression Note    Patient Details  Name: Billy [name] MRN: [id number] Date of Birth: [birth year]  Transition of Care [facility] CM/SW [place], [place] Phone Number: 08/05/2021, 11:26 AM  Clinical Narrative:    HF CSW followed up with the patients sister [name] [phone] and she reported that she did talk with her sister and they do not want to appeal the insurance for the SNF as they don't think the decision will change. CSW informed [name] that the next option would be home with home health and passed this along to the treatment team and RNCM for set up. [name] reported that her and her sister were able to clean out his house so that a rollator/walker can be used in the home.  RNCM to set up home health needs. RNCM and CSW spoke with [name] at bedside about [facility] and DME needs. [name] is enrolled in [facility] Transportation.  CSW will continue to follow through discharge.   Expected Discharge Plan: [place] Barriers to Discharge: Continued Medical Work up  Expected Discharge Plan and Services Expected Discharge Plan: [place] In-house Referral: Clinical Social Work Discharge Planning Services: CM Consult Post Acute Care Choice: [place] Living arrangements for the past 2 months: Single Family Home                                       Social Determinants of Health (SDOH) Interventions Food Insecurity Interventions: Intervention Not Indicated Financial Strain Interventions: Intervention Not Indicated Housing Interventions: Intervention Not Indicated Transportation Interventions: Intervention Not Indicated, Other (Comment) (Pt is visually impaired but receives help from family with transportation needs.)  Readmission Risk Interventions No flowsheet data found.  Tinaya Ceballos, MSW, [place] [facility] Social Worker

## 2021-08-05 NOTE — Progress Notes (Signed)
Mobility Specialist Progress Note    08/05/21 1437  Orthostatic Sitting  BP- Sitting 132/83  Orthostatic Standing at 0 minutes  BP- Standing at 0 minutes 105/72  Mobility  Activity Ambulated in hall  Level of Assistance Independent  Assistive Device None  Distance Ambulated (ft) 410 ft  Mobility Ambulated independently in hallway  Mobility Response Tolerated well  Mobility performed by Mobility specialist  Bed Position Chair  $Mobility charge 1 Mobility   Pre-Mobility: 79 HR  Pt received sitting EOB and agreeable. C/o minimal dizziness. Took x1 short standing break and upon return to unit met in hall by CM and CSW and returned to chair with call bell in reach.     Mobility Specialist  M.S. Primary Phone: [phone] M.S. Secondary Phone: [phone]  

## 2021-08-05 NOTE — Progress Notes (Signed)
[address]       [place] [address]             [phone]      28 Days Post-Op Procedure(s) (LRB): CORONARY ARTERY BYPASS GRAFTING (CABG) TIMES FOUR USING LEFT INTERNAL MAMMARY ARTERY, GREATER SAPHENOUS VEIN HARVESTED ENDOSCOPICALLY, AND LEFT RADIAL ARTERY HARVESTED OPEN (N/A) TRANSESOPHAGEAL ECHOCARDIOGRAM (TEE) (N/A) RADIAL ARTERY HARVEST (Left) ENDOVEIN HARVEST OF GREATER SAPHENOUS VEIN (Right) MAZE Subjective: Some dizziness and orthostasis but generally conts to feel better  Objective: Vital signs in last 24 hours: Temp:  [97.6 F (36.4 C)-98.5 F (36.9 C)] 98.3 F (36.8 C) (11/22 0749) Pulse Rate:  [70-76] 70 (11/22 0352) Cardiac Rhythm: Normal sinus rhythm (11/21 1918) Resp:  [15-18] 15 (11/22 0749) BP: (109-146)/(81-90) 137/81 (11/22 0749) SpO2:  [94 %-100 %] 98 % (11/22 0749) Weight:  [110.1 kg] 110.1 kg (11/22 0352)  Hemodynamic parameters for last 24 hours:    Intake/Output from previous day: 11/21 0701 - 11/22 0700 In: 0  Out: 1900 [Urine:1900] Intake/Output this shift: No intake/output data recorded.  General appearance: alert, cooperative, and mild distress Heart: regular rate and rhythm Lungs: clear to auscultation bilaterally Abdomen: benign Extremities: edema is improved Wound: incis well healed  Lab Results: No results for input(s): WBC, HGB, HCT, PLT in the last 72 hours. BMET:  Recent Labs    08/04/21 0026 08/05/21 0048  NA 139  137 140  K 4.8  4.8 4.6  CL 106  106 108  CO2 27  26 25   GLUCOSE 134*  131* 98  BUN 45*  45* 43*  CREATININE 2.48*  2.41* 2.33*  CALCIUM 8.5*  8.4* 8.6*    PT/INR: No results for input(s): LABPROT, INR in the last 72 hours. ABG    Component Value Date/Time   PHART 7.459 (H) 07/16/2021 1330   HCO3 24.8 07/16/2021 1330   TCO2 26 07/16/2021 1330   ACIDBASEDEF 9.0 (H) 07/10/2021 0303   O2SAT 38.3 07/23/2021 1447   CBG (last 3)  Recent Labs    08/04/21 1634  08/04/21 2051 08/05/21 0623  GLUCAP 204* 169* 82    Meds Scheduled Meds:  ALPRAZolam  1 mg Oral TID   apixaban  5 mg Oral BID   atorvastatin  80 mg Oral Daily   carvedilol  6.25 mg Oral BID WC   chlorhexidine  15 mL Mouth Rinse BID   clopidogrel  75 mg Oral Daily   darbepoetin (ARANESP) injection - NON-DIALYSIS  100 mcg Subcutaneous Q Mon-1800   feeding supplement (NEPRO CARB STEADY)  237 mL Oral BID BM   hydrALAZINE  12.5 mg Oral TID   insulin aspart  0-15 Units Subcutaneous TID WC   insulin detemir  20 Units Subcutaneous BID   isosorbide mononitrate  15 mg Oral Daily   mouth rinse  15 mL Mouth Rinse q12n4p   multivitamin  1 tablet Oral QHS   pantoprazole  40 mg Oral Daily   sertraline  50 mg Oral Daily   sodium chloride flush  3 mL Intravenous Q12H   tamsulosin  0.4 mg Oral QHS   traZODone  100 mg Oral QHS   Continuous Infusions:  promethazine (PHENERGAN) injection (IM or IVPB) Stopped (07/09/21 1628)   PRN Meds:.acetaminophen, albuterol, docusate, lip balm, meclizine, metoprolol tartrate, ondansetron (ZOFRAN) IV, oxyCODONE, polyethylene glycol, promethazine (PHENERGAN) injection (IM or IVPB), sennosides, simethicone  Xrays No results found.  Assessment/Plan: S/P Procedure(s) (LRB): CORONARY ARTERY BYPASS GRAFTING (CABG) TIMES  FOUR USING LEFT INTERNAL MAMMARY ARTERY, GREATER SAPHENOUS VEIN HARVESTED ENDOSCOPICALLY, AND LEFT RADIAL ARTERY HARVESTED OPEN (N/A) TRANSESOPHAGEAL ECHOCARDIOGRAM (TEE) (N/A) RADIAL ARTERY HARVEST (Left) ENDOVEIN HARVEST OF GREATER SAPHENOUS VEIN (Right) MAZE  1 afeb, VSS , sinus rhythm 2 sats good on RA 3 good UOP 4 renal fxn conts to improve, approaching preop baseline 5 adding midodrine 6 dispo pending, may be approaching safe for home if dizziness/orthostasis conts to improve     LOS: 29 days    Rontavious Giovanni PA-C Pager [phone] 08/05/2021

## 2021-08-06 ENCOUNTER — Other Ambulatory Visit: Payer: Self-pay | Admitting: *Deleted

## 2021-08-06 ENCOUNTER — Other Ambulatory Visit: Payer: Self-pay | Admitting: Surgical

## 2021-08-06 DIAGNOSIS — R531 Weakness: Secondary | ICD-10-CM | POA: Diagnosis not present

## 2021-08-06 DIAGNOSIS — R570 Cardiogenic shock: Secondary | ICD-10-CM | POA: Diagnosis not present

## 2021-08-06 LAB — RENAL FUNCTION PANEL
Albumin: 2.8 g/dL — ABNORMAL LOW (ref 3.5–5.0)
Anion gap: 5 (ref 5–15)
BUN: 38 mg/dL — ABNORMAL HIGH (ref 6–20)
CO2: 27 mmol/L (ref 22–32)
Calcium: 8.8 mg/dL — ABNORMAL LOW (ref 8.9–10.3)
Chloride: 109 mmol/L (ref 98–111)
Creatinine, Ser: 2.25 mg/dL — ABNORMAL HIGH (ref 0.61–1.24)
GFR, Estimated: 33 mL/min — ABNORMAL LOW (ref >60)
Glucose, Bld: 138 mg/dL — ABNORMAL HIGH (ref 70–99)
Phosphorus: 4.6 mg/dL (ref 2.5–4.6)
Potassium: 4.8 mmol/L (ref 3.5–5.1)
Sodium: 141 mmol/L (ref 135–145)

## 2021-08-06 LAB — GLUCOSE, CAPILLARY: Glucose-Capillary: 88 mg/dL (ref 70–99)

## 2021-08-06 LAB — MAGNESIUM: Magnesium: 1.8 mg/dL (ref 1.7–2.4)

## 2021-08-06 MED ORDER — APIXABAN 5 MG PO TABS: 5.0000 mg | ORAL_TABLET | Freq: Two times a day (BID) | ORAL | 1 refills | Status: DC

## 2021-08-06 MED ORDER — HYDRALAZINE HCL 25 MG PO TABS: 12.5000 mg | ORAL_TABLET | Freq: Three times a day (TID) | ORAL | 1 refills | Status: DC

## 2021-08-06 MED ORDER — CARVEDILOL 6.25 MG PO TABS
6.2500 mg | ORAL_TABLET | Freq: Two times a day (BID) | ORAL | 1 refills | Status: DC
Start: 2021-08-06 — End: 2021-08-12

## 2021-08-06 MED ORDER — SERTRALINE HCL 50 MG PO TABS: 50.0000 mg | ORAL_TABLET | Freq: Every day | ORAL | 1 refills | Status: DC

## 2021-08-06 MED ORDER — OXYCODONE HCL 5 MG PO TABS: 5.0000 mg | ORAL_TABLET | Freq: Two times a day (BID) | ORAL | 0 refills | Status: AC | PRN

## 2021-08-06 MED ORDER — MECLIZINE HCL 12.5 MG PO TABS: 12.5000 mg | ORAL_TABLET | Freq: Two times a day (BID) | ORAL | 0 refills | Status: DC | PRN

## 2021-08-06 MED ORDER — ISOSORBIDE MONONITRATE ER 30 MG PO TB24: 15.0000 mg | ORAL_TABLET | Freq: Every day | ORAL | 0 refills | Status: DC

## 2021-08-06 MED ORDER — CLOPIDOGREL BISULFATE 75 MG PO TABS
75.0000 mg | ORAL_TABLET | Freq: Every day | ORAL | 1 refills | Status: DC
Start: 2021-08-06 — End: 2021-08-12

## 2021-08-06 MED ORDER — TAMSULOSIN HCL 0.4 MG PO CAPS: 0.4000 mg | ORAL_CAPSULE | Freq: Every day | ORAL | 1 refills | Status: DC

## 2021-08-06 MED ORDER — ATORVASTATIN CALCIUM 80 MG PO TABS: 80.0000 mg | ORAL_TABLET | Freq: Every day | ORAL | 1 refills | Status: DC

## 2021-08-06 NOTE — Patient Outreach (Signed)
[facility]) Care Management Telephonic RN Care Manager Note     08/06/2021 Name:  Billy [name]           MRN:  [id number]      DOB:  07-04-1966   Summary:   Post hospital follow up outreach.  He is able to verify his HIPAA identifiers Spoke with Mr Sydnor and his sister after his discharge home safely today.  History of ventilator for "seven days" He and she are preparing his medicine containers for the next week He reports he has eaten a "TV dinner "   After summary inpatient visit reviewed with emphasis on medications Especially his Plavix and Eliquis  Vascepa and Lipitor He consulted with his local pharmacist and his Plavix and Lipitor are filled for the upcoming thanksgiving holidays RN CM discussed the similarities of Plavix and Eliquis then of Vascepa and Lipitor  He was encouraged to take his Plavix and Lipitor as filled by his pharmacist and listed on his discharge sheets reviewed until seen by his pcp in next 7 days   He inquired about pain medicine intake RN CM and patient discussed the use of ibuprofen vs tylenol He was encouraged to take Ibuprofen vs tylenol as he has previously been advised by his MD Tylenol was noted on his medication record in EPIC RN CM removed it He states he is not having pain and has only noted pain as he was "tugging to open his door to enter his home" Can not take tylenol   Diabetes  Used the [employer] He inquired about frequency of meals He generally had been managing his cbgs well at home with 2 meals a day      Recommendations/Changes made from today's visit: Follow the advise of your local pharmacist -take the plavix and lipitor until seen by pcp      Subjective: Billy [name] is an 55 y.o. year old male who is a primary patient of [place], Annie Main, MD. The care management team was consulted for assistance with care management and/or care coordination needs.     Telephonic RN Care Manager completed Telephone  Visit today.    Objective:  Objective:  Medications Reviewed Today     Reviewed by Ursula Beath, [facility] (Pharmacist) on 07/09/21 at 1132  Med List Status: Complete   Medication Order Taking? Sig Documenting Provider Last Dose Status Informant  alprazolam (XANAX) 2 MG tablet [id number] Yes Take 1 tablet (2 mg total) by mouth 3 (three) times daily as needed for anxiety. Nita Sells, MD Past Week Active Self  amLODipine (NORVASC) 5 MG tablet [id number] No Take 1 tablet (5 mg total) by mouth daily.  Patient not taking: Reported on 07/09/2021   Kathie Dike, MD Not Taking Active   B-D ULTRAFINE III SHORT PEN 31G X 8 MM MISC [id number] Yes USE TO ADMINISTER INSULIN DAILY E11..39 [provider] Past Week Active Self           Med Note Nash Mantis, [name]   Wed Jun 30, 2017  6:58 PM)    carvedilol (COREG) 12.5 MG tablet [id number] No Take 1 tablet (12.5 mg total) by mouth 2 (two) times daily.  Patient not taking: Reported on 07/09/2021   Skeet Latch, MD Not Taking Active   diclofenac Sodium (VOLTAREN) 1 % GEL [id number] Yes Apply topically as needed. [provider] Past Month Active   gabapentin (NEURONTIN) 100 MG capsule [id number] Yes Take 200 mg by mouth 2 (two) times  daily. Per patient, takes all 4 capsules at Bedtime [provider] Past Week Active Self  Lancets (FREESTYLE) lancets [id number] Yes CHECK BLOOD SUGAR 3 TIMES DAILY [provider] Past Week Active Self           Med Note Nash Mantis, [name]   Wed Jun 30, 2017  6:58 PM)    linagliptin (TRADJENTA) 5 MG TABS tablet [id number] Yes Take 5 mg by mouth daily. Reported on 09/09/2015 [provider] Past Week Active   MAGNESIUM CITRATE PO [id number] Yes Take 1 scoop by mouth daily as needed. [provider] Past Week Active Self  methocarbamol (ROBAXIN) 500 MG tablet [id number] Yes Take 500 mg by mouth 3 (three) times daily. [provider] Past Week Active   Multiple  Vitamin (MULTIVITAMIN WITH MINERALS) TABS tablet [id number] Yes Take 1 tablet by mouth daily. [provider] Past Week Active Self  ramipril (ALTACE) 10 MG capsule [id number] Yes Take 20 mg by mouth daily. [provider] Past Week Active Self  traZODone (DESYREL) 100 MG tablet [id number] Yes Take 100 mg by mouth at bedtime. Per pt - takes 75 mg (splits tablet in half; and then quarters - then takes 3/4 of tablet nightly) [provider] Past Week Active Self  TRESIBA FLEXTOUCH 100 UNIT/ML FlexTouch Pen [id number] Yes Inject 0-30 Units into the skin. Inject 10-30 units subQ PRN. Checks CBG with Libre. Gives insulin if "sugar is high" [provider] Past Week Active Self  VASCEPA 1 g capsule [id number] Yes Take 2 g by mouth 2 (two) times daily. [provider] Past Week Active              SDOH:  (Social Determinants of Health) assessments and interventions performed:    Care Plan  Review of patient past medical history, allergies, medications, health status, including review of consultants reports, laboratory and other test data, was performed as part of comprehensive evaluation for care management services.   There are no care plans that you recently modified to display for this patient.    Plan: The care management team will reach out to the patient again over the next 7 business days.  Neli Fofana L. Lavina Hamman, RN, BSN, [place] Coordinator Office number [phone] [facility] number [phone] Fax number [phone]

## 2021-08-06 NOTE — Consult Note (Signed)
[facility] CM Inpatient Consult   08/06/2021  Billy [name] 19-Dec-1965 [id number]  [facility] Follow Up:  Patient reviewed for [facility] CM post hospital follow up services for chronic disease management and care coordination. Spoke with patient bedside. HIPAA verified. Explained [facility] CM services and brochure provided. Patient verbalizes consent for post hospital follow up with RN case manager.  Referral to [facility] CM placed. Of note, [facility] Care Management services does not replace or interfere with any services that are arranged by inpatient case management or social work.   Netta Cedars, MSN, RN [facility] [employer] [phone]  Toll free office [phone]

## 2021-08-06 NOTE — Progress Notes (Signed)
Mobility Specialist Progress Note    08/06/21 1118  Mobility  Activity Ambulated in hall  Level of Assistance Modified independent, requires aide device or extra time  Assistive Device Four wheel walker  Distance Ambulated (ft) 410 ft  Mobility Ambulated independently in hallway  Mobility Response Tolerated well  Mobility performed by Mobility specialist  $Mobility charge 1 Mobility   Pre-Mobility: 134/94 BP  Pt received sitting EOB and agreeable. No complaints on walk. Returned to sitting EOB.  [facility] Mobility Specialist  M.S. Primary Phone: [phone] M.S. Secondary Phone: [phone]

## 2021-08-06 NOTE — Plan of Care (Signed)

## 2021-08-06 NOTE — Progress Notes (Signed)
[address]       [name] [address]             [phone]      29 Days Post-Op Procedure(s) (LRB): CORONARY ARTERY BYPASS GRAFTING (CABG) TIMES FOUR USING LEFT INTERNAL MAMMARY ARTERY, GREATER SAPHENOUS VEIN HARVESTED ENDOSCOPICALLY, AND LEFT RADIAL ARTERY HARVESTED OPEN (N/A) TRANSESOPHAGEAL ECHOCARDIOGRAM (TEE) (N/A) RADIAL ARTERY HARVEST (Left) ENDOVEIN HARVEST OF GREATER SAPHENOUS VEIN (Right) MAZE Subjective: Feels well  Objective: Vital signs in last 24 hours: Temp:  [97.6 F (36.4 C)-98.3 F (36.8 C)] 98 F (36.7 C) (11/22 2037) Pulse Rate:  [72-75] 72 (11/22 2037) Cardiac Rhythm: Normal sinus rhythm (11/23 0404) Resp:  [15-20] 20 (11/22 2037) BP: (134-149)/(81-99) 149/99 (11/22 2037) SpO2:  [97 %-100 %] 100 % (11/22 2037) Weight:  [110.1 kg] 110.1 kg (11/23 0500)  Hemodynamic parameters for last 24 hours:    Intake/Output from previous day: 11/22 0701 - 11/23 0700 In: 600 [P.O.:600] Out: -  Intake/Output this shift: No intake/output data recorded.  General appearance: alert, cooperative, and no distress Heart: regular rate and rhythm Lungs: clear to auscultation bilaterally Abdomen: benign Extremities: min edema Wound: incis well healed  Lab Results: No results for input(s): WBC, HGB, HCT, PLT in the last 72 hours. BMET:  Recent Labs    08/05/21 0048 08/06/21 0218  NA 140 141  K 4.6 4.8  CL 108 109  CO2 25 27  GLUCOSE 98 138*  BUN 43* 38*  CREATININE 2.33* 2.25*  CALCIUM 8.6* 8.8*    PT/INR: No results for input(s): LABPROT, INR in the last 72 hours. ABG    Component Value Date/Time   PHART 7.459 (H) 07/16/2021 1330   HCO3 24.8 07/16/2021 1330   TCO2 26 07/16/2021 1330   ACIDBASEDEF 9.0 (H) 07/10/2021 0303   O2SAT 38.3 07/23/2021 1447   CBG (last 3)  Recent Labs    08/05/21 1628 08/05/21 2108 08/06/21 0544  GLUCAP 156* 176* 88    Meds Scheduled Meds:  ALPRAZolam  1 mg Oral TID   apixaban  5 mg Oral  BID   atorvastatin  80 mg Oral Daily   carvedilol  6.25 mg Oral BID WC   chlorhexidine  15 mL Mouth Rinse BID   clopidogrel  75 mg Oral Daily   darbepoetin (ARANESP) injection - NON-DIALYSIS  100 mcg Subcutaneous Q Mon-1800   feeding supplement (NEPRO CARB STEADY)  237 mL Oral BID BM   hydrALAZINE  12.5 mg Oral TID   insulin aspart  0-15 Units Subcutaneous TID WC   insulin detemir  20 Units Subcutaneous BID   isosorbide mononitrate  15 mg Oral Daily   mouth rinse  15 mL Mouth Rinse q12n4p   midodrine  5 mg Oral TID WC   multivitamin  1 tablet Oral QHS   pantoprazole  40 mg Oral Daily   sertraline  50 mg Oral Daily   sodium chloride flush  3 mL Intravenous Q12H   tamsulosin  0.4 mg Oral QHS   traZODone  100 mg Oral QHS   Continuous Infusions:  promethazine (PHENERGAN) injection (IM or IVPB) Stopped (07/09/21 1628)   PRN Meds:.acetaminophen, albuterol, docusate, lip balm, meclizine, metoprolol tartrate, ondansetron (ZOFRAN) IV, oxyCODONE, polyethylene glycol, promethazine (PHENERGAN) injection (IM or IVPB), sennosides, simethicone  Xrays No results found.  Assessment/Plan: S/P Procedure(s) (LRB): CORONARY ARTERY BYPASS GRAFTING (CABG) TIMES FOUR USING LEFT INTERNAL MAMMARY ARTERY, GREATER SAPHENOUS VEIN HARVESTED ENDOSCOPICALLY, AND LEFT RADIAL ARTERY HARVESTED  OPEN (N/A) TRANSESOPHAGEAL ECHOCARDIOGRAM (TEE) (N/A) RADIAL ARTERY HARVEST (Left) ENDOVEIN HARVEST OF GREATER SAPHENOUS VEIN (Right) MAZE  1 afeb, VSS s BP 134-160's- will d/c midodrine 2 sats good on RA 3 excellent UOP 4 creat 2.25 and conts to trend lower over time, approaching baseline 5 HH arranged 6 stable for d/c    LOS: 30 days    Ferron Giovanni PA-C Pager [phone]  08/06/2021

## 2021-08-06 NOTE — Progress Notes (Signed)
All set for discharge, Rolator delivered. Awaiting ride home.

## 2021-08-09 DIAGNOSIS — Z7902 Long term (current) use of antithrombotics/antiplatelets: Secondary | ICD-10-CM | POA: Diagnosis not present

## 2021-08-09 DIAGNOSIS — I471 Supraventricular tachycardia: Secondary | ICD-10-CM | POA: Diagnosis not present

## 2021-08-09 DIAGNOSIS — I480 Paroxysmal atrial fibrillation: Secondary | ICD-10-CM | POA: Diagnosis not present

## 2021-08-09 DIAGNOSIS — I4891 Unspecified atrial fibrillation: Secondary | ICD-10-CM | POA: Diagnosis not present

## 2021-08-09 DIAGNOSIS — I252 Old myocardial infarction: Secondary | ICD-10-CM | POA: Diagnosis not present

## 2021-08-09 DIAGNOSIS — E119 Type 2 diabetes mellitus without complications: Secondary | ICD-10-CM | POA: Diagnosis not present

## 2021-08-09 DIAGNOSIS — G629 Polyneuropathy, unspecified: Secondary | ICD-10-CM | POA: Diagnosis not present

## 2021-08-09 DIAGNOSIS — K567 Ileus, unspecified: Secondary | ICD-10-CM | POA: Diagnosis not present

## 2021-08-09 DIAGNOSIS — R69 Illness, unspecified: Secondary | ICD-10-CM | POA: Diagnosis not present

## 2021-08-09 DIAGNOSIS — I10 Essential (primary) hypertension: Secondary | ICD-10-CM | POA: Diagnosis not present
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
## 2021-08-12 ENCOUNTER — Encounter (HOSPITAL_BASED_OUTPATIENT_CLINIC_OR_DEPARTMENT_OTHER): Payer: Self-pay | Admitting: Nurse Practitioner

## 2021-08-12 ENCOUNTER — Other Ambulatory Visit: Payer: Self-pay

## 2021-08-12 ENCOUNTER — Ambulatory Visit (HOSPITAL_BASED_OUTPATIENT_CLINIC_OR_DEPARTMENT_OTHER): Payer: Medicare HMO | Admitting: Nurse Practitioner

## 2021-08-12 VITALS — BP 136/88 | HR 78 | Ht 74.0 in | Wt 241.0 lb

## 2021-08-12 DIAGNOSIS — I4819 Other persistent atrial fibrillation: Secondary | ICD-10-CM

## 2021-08-12 DIAGNOSIS — I255 Ischemic cardiomyopathy: Secondary | ICD-10-CM | POA: Diagnosis not present

## 2021-08-12 DIAGNOSIS — I2510 Atherosclerotic heart disease of native coronary artery without angina pectoris: Secondary | ICD-10-CM

## 2021-08-12 DIAGNOSIS — I5043 Acute on chronic combined systolic (congestive) and diastolic (congestive) heart failure: Secondary | ICD-10-CM

## 2021-08-12 DIAGNOSIS — R42 Dizziness and giddiness: Secondary | ICD-10-CM | POA: Diagnosis not present

## 2021-08-12 DIAGNOSIS — I10 Essential (primary) hypertension: Secondary | ICD-10-CM | POA: Diagnosis not present

## 2021-08-12 DIAGNOSIS — I4891 Unspecified atrial fibrillation: Secondary | ICD-10-CM | POA: Diagnosis not present

## 2021-08-12 DIAGNOSIS — E785 Hyperlipidemia, unspecified: Secondary | ICD-10-CM

## 2021-08-12 DIAGNOSIS — R69 Illness, unspecified: Secondary | ICD-10-CM | POA: Diagnosis not present

## 2021-08-12 DIAGNOSIS — Z79899 Other long term (current) drug therapy: Secondary | ICD-10-CM

## 2021-08-12 DIAGNOSIS — Z951 Presence of aortocoronary bypass graft: Secondary | ICD-10-CM

## 2021-08-12 DIAGNOSIS — F1290 Cannabis use, unspecified, uncomplicated: Secondary | ICD-10-CM

## 2021-08-12 MED ORDER — CARVEDILOL 6.25 MG PO TABS: 6.2500 mg | ORAL_TABLET | Freq: Two times a day (BID) | ORAL | 3 refills | Status: DC

## 2021-08-12 MED ORDER — HYDRALAZINE HCL 25 MG PO TABS: 12.5000 mg | ORAL_TABLET | Freq: Three times a day (TID) | ORAL | 3 refills | Status: DC

## 2021-08-12 MED ORDER — ATORVASTATIN CALCIUM 80 MG PO TABS: 80.0000 mg | ORAL_TABLET | Freq: Every day | ORAL | 3 refills | Status: DC

## 2021-08-12 MED ORDER — ISOSORBIDE MONONITRATE ER 30 MG PO TB24: 15.0000 mg | ORAL_TABLET | Freq: Every day | ORAL | 3 refills | Status: DC

## 2021-08-12 MED ORDER — CLOPIDOGREL BISULFATE 75 MG PO TABS: 75.0000 mg | ORAL_TABLET | Freq: Every day | ORAL | 3 refills | Status: DC

## 2021-08-12 MED ORDER — APIXABAN 5 MG PO TABS: 5.0000 mg | ORAL_TABLET | Freq: Two times a day (BID) | ORAL | 3 refills | Status: DC

## 2021-08-12 NOTE — Patient Instructions (Signed)
Medication Instructions:  Your physician has recommended you make the following change in your medication:   Restart: Eliquis 5mg  Twice Daily   *If you need a refill on your cardiac medications before your next appointment, please call your pharmacy*   Lab Work: Your physician recommends that you return for lab work today- CBC, Liver, and BMP  If you have labs (blood work) drawn today and your tests are completely normal, you will receive your results only by: MyChart Message (if you have MyChart) OR A paper copy in the mail If you have any lab test that is abnormal or we need to change your treatment, we will call you to review the results.   Testing/Procedures: None ordered today    Follow-Up: At [facility], you and your health needs are our priority.  As part of our continuing mission to provide you with exceptional heart care, we have created designated Provider Care Teams.  These Care Teams include your primary Cardiologist (physician) and Advanced Practice Providers (APPs -  Physician Assistants and Nurse Practitioners) who all work together to provide you with the care you need, when you need it.  We recommend signing up for the patient portal called "MyChart".  Sign up information is provided on this After Visit Summary.  MyChart is used to connect with patients for Virtual Visits (Telemedicine).  Patients are able to view lab/test results, encounter notes, upcoming appointments, etc.  Non-urgent messages can be sent to your provider as well.   To learn more about what you can do with MyChart, go to [email or web address].    Your next appointment:   Please keep your appointment on 10/02/21 @ 08:40am   The format for your next appointment:   In Person  Provider:   Skeet Latch, MD

## 2021-08-12 NOTE — Patient Outreach (Signed)
[facility]) Care Management Telephonic RN Care Manager Note   08/06/2021 Name:  Billy [name] MRN:  [id number] DOB:  12-03-1965  Summary:  Post hospital follow up outreach.  He is able to verify his HIPAA identifiers Spoke with Billy [name] and his sister after his discharge home safely today.  History of ventilator for "seven days" He and she are preparing his medicine containers for the next week He reports he has eaten a "TV dinner "  After summary inpatient visit reviewed with emphasis on medications Especially his Plavix and Eliquis  Vascepa and Lipitor He consulted with his local pharmacist and his Plavix and Lipitor are filled for the upcoming thanksgiving holidays RN CM discussed the similarities of Plavix and Eliquis then of Vascepa and Lipitor  He was encouraged to take his Plavix and Lipitor as filled by his pharmacist and listed on his discharge sheets reviewed until seen by his pcp in next 7 days  He inquired about pain medicine intake RN CM and patient discussed the use of ibuprofen vs tylenol He was encouraged to take Ibuprofen vs tylenol as he has previously been advised by his MD Tylenol was noted on his medication record in EPIC RN CM removed it He states he is not having pain and has only noted pain as he was "tugging to open his door to enter his home" Can not take tylenol  Diabetes  Used the [employer] He inquired about frequency of meals He generally had been managing his cbgs well at home with 2 meals a day    Recommendations/Changes made from today's visit: Follow the advise of your local pharmacist -take the plavix and lipitor until seen by pcp    Subjective: Billy [name] is an 55 y.o. year old male who is a primary patient of [place], Annie Main, MD. The care management team was consulted for assistance with care management and/or care coordination needs.    Telephonic RN Care Manager completed Telephone Visit today.    Objective:  Medications Reviewed Today     Reviewed by Ursula Beath, [facility] (Pharmacist) on 07/09/21 at 1132  Med List Status: Complete   Medication Order Taking? Sig Documenting Provider Last Dose Status Informant  alprazolam (XANAX) 2 MG tablet [id number] Yes Take 1 tablet (2 mg total) by mouth 3 (three) times daily as needed for anxiety. Nita Sells, MD Past Week Active Self  amLODipine (NORVASC) 5 MG tablet [id number] No Take 1 tablet (5 mg total) by mouth daily.  Patient not taking: Reported on 07/09/2021   Kathie Dike, MD Not Taking Active   B-D ULTRAFINE III SHORT PEN 31G X 8 MM MISC [id number] Yes USE TO ADMINISTER INSULIN DAILY E11..39 [provider] Past Week Active Self           Med Note Nash Mantis, [name]   Wed Jun 30, 2017  6:58 PM)    carvedilol (COREG) 12.5 MG tablet [id number] No Take 1 tablet (12.5 mg total) by mouth 2 (two) times daily.  Patient not taking: Reported on 07/09/2021   Skeet Latch, MD Not Taking Active   diclofenac Sodium (VOLTAREN) 1 % GEL [id number] Yes Apply topically as needed. [provider] Past Month Active   gabapentin (NEURONTIN) 100 MG capsule [id number] Yes Take 200 mg by mouth 2 (two) times daily. Per patient, takes all 4 capsules at Bedtime [provider] Past Week Active Self  Lancets (FREESTYLE) lancets [id number] Yes CHECK BLOOD SUGAR 3 TIMES DAILY [provider] Past Week Active Self           Med Note Nash Mantis, [name]   Wed Jun 30, 2017  6:58 PM)    linagliptin (TRADJENTA) 5 MG TABS tablet [id number] Yes Take 5 mg by mouth daily. Reported on 09/09/2015 [provider] Past Week Active   MAGNESIUM CITRATE PO [id number] Yes Take 1 scoop by mouth daily as needed. [provider] Past Week Active Self  methocarbamol (ROBAXIN) 500 MG tablet [id number] Yes Take 500 mg by mouth 3 (three) times daily. [provider] Past Week Active   Multiple Vitamin (MULTIVITAMIN WITH  MINERALS) TABS tablet [id number] Yes Take 1 tablet by mouth daily. [provider] Past Week Active Self  ramipril (ALTACE) 10 MG capsule [id number] Yes Take 20 mg by mouth daily. [provider] Past Week Active Self  traZODone (DESYREL) 100 MG tablet [id number] Yes Take 100 mg by mouth at bedtime. Per pt - takes 75 mg (splits tablet in half; and then quarters - then takes 3/4 of tablet nightly) [provider] Past Week Active Self  TRESIBA FLEXTOUCH 100 UNIT/ML FlexTouch Pen [id number] Yes Inject 0-30 Units into the skin. Inject 10-30 units subQ PRN. Checks CBG with Libre. Gives insulin if "sugar is high" [provider] Past Week Active Self  VASCEPA 1 g capsule [id number] Yes Take 2 g by mouth 2 (two) times daily. [provider] Past Week Active              SDOH:  (Social Determinants of Health) assessments and interventions performed:   Patient Active Problem List   Diagnosis Date Noted   Pressure injury of skin 07/18/2021   Cardiogenic shock (HCC)    AKI (acute kidney injury) ([place])    Acute respiratory failure with hypoxia (HCC)    S/P CABG x 4 07/08/2021   STEMI (ST elevation myocardial infarction) ([place]) 07/07/2021   Exertional dyspnea 06/30/2021   SVT (supraventricular tachycardia) ([place]) 06/30/2021   Chest pain 06/25/2021   Renal insufficiency 06/25/2021   Hypertensive urgency 06/25/2021   S/P amputation of lesser toe, right ([place]) 07/13/2017   Chronic osteomyelitis of toe, right ([place])    Anxiety and depression 06/30/2017   Left shoulder pain 06/30/2017   Marijuana dependence ([place]) 06/30/2017   Diabetes mellitus with ulcer of toe ([place]) 06/05/2012   Hypertension 06/05/2012    Care Plan  Review of patient past medical history, allergies, medications, health status, including review of consultants reports, laboratory and other test data, was performed as part of comprehensive evaluation for care management services.   There are no  care plans that you recently modified to display for this patient.    Plan  Patient agrees to care plan and follow up within the next 7 business days    Tarvis Blossom L. Lavina Hamman, RN, BSN, [place] Coordinator Office number [phone] [facility] number [phone] Fax number [phone]

## 2021-08-13 ENCOUNTER — Encounter (HOSPITAL_BASED_OUTPATIENT_CLINIC_OR_DEPARTMENT_OTHER): Payer: Self-pay

## 2021-08-13 DIAGNOSIS — I252 Old myocardial infarction: Secondary | ICD-10-CM | POA: Diagnosis not present

## 2021-08-13 DIAGNOSIS — I471 Supraventricular tachycardia: Secondary | ICD-10-CM | POA: Diagnosis not present

## 2021-08-13 DIAGNOSIS — Z7902 Long term (current) use of antithrombotics/antiplatelets: Secondary | ICD-10-CM | POA: Diagnosis not present

## 2021-08-13 DIAGNOSIS — I10 Essential (primary) hypertension: Secondary | ICD-10-CM | POA: Diagnosis not present

## 2021-08-13 DIAGNOSIS — I480 Paroxysmal atrial fibrillation: Secondary | ICD-10-CM | POA: Diagnosis not present

## 2021-08-13 DIAGNOSIS — R69 Illness, unspecified: Secondary | ICD-10-CM | POA: Diagnosis not present

## 2021-08-13 DIAGNOSIS — K567 Ileus, unspecified: Secondary | ICD-10-CM | POA: Diagnosis not present

## 2021-08-13 DIAGNOSIS — E119 Type 2 diabetes mellitus without complications: Secondary | ICD-10-CM | POA: Diagnosis not present

## 2021-08-13 DIAGNOSIS — G629 Polyneuropathy, unspecified: Secondary | ICD-10-CM | POA: Diagnosis not present

## 2021-08-13 LAB — CBC
Hematocrit: 35.7 % — ABNORMAL LOW (ref 37.5–51.0)
Hemoglobin: 11.8 g/dL — ABNORMAL LOW (ref 13.0–17.7)
MCH: 31 pg (ref 26.6–33.0)
MCHC: 33.1 g/dL (ref 31.5–35.7)
MCV: 94 fL (ref 79–97)
Platelets: 199 10*3/uL (ref 150–450)
RBC: 3.81 x10E6/uL — ABNORMAL LOW (ref 4.14–5.80)
RDW: 13.8 % (ref 11.6–15.4)
WBC: 5 10*3/uL (ref 3.4–10.8)

## 2021-08-13 LAB — BASIC METABOLIC PANEL
BUN/Creatinine Ratio: 10 (ref 9–20)
BUN: 23 mg/dL (ref 6–24)
CO2: 26 mmol/L (ref 20–29)
Calcium: 9.2 mg/dL (ref 8.7–10.2)
Chloride: 106 mmol/L (ref 96–106)
Creatinine, Ser: 2.28 mg/dL — ABNORMAL HIGH (ref 0.76–1.27)
Glucose: 127 mg/dL — ABNORMAL HIGH (ref 70–99)
Potassium: 4.9 mmol/L (ref 3.5–5.2)
Sodium: 145 mmol/L — ABNORMAL HIGH (ref 134–144)
eGFR: 33 mL/min/{1.73_m2} — ABNORMAL LOW (ref >59)

## 2021-08-13 LAB — HEPATIC FUNCTION PANEL
ALT: 8 IU/L (ref 0–44)
AST: 9 IU/L (ref 0–40)
Albumin: 4 g/dL (ref 3.8–4.9)
Alkaline Phosphatase: 68 IU/L (ref 44–121)
Bilirubin Total: 0.4 mg/dL (ref 0.0–1.2)
Bilirubin, Direct: 0.15 mg/dL (ref 0.00–0.40)
Total Protein: 6.7 g/dL (ref 6.0–8.5)

## 2021-08-13 NOTE — Addendum Note (Signed)
Addended by: Gerald Stabs on: 08/13/2021 05:04 PM   Modules accepted: Orders

## 2021-08-13 NOTE — Progress Notes (Signed)
Results sent via my chart 

## 2021-08-14 ENCOUNTER — Encounter: Payer: Self-pay | Admitting: *Deleted

## 2021-08-14 ENCOUNTER — Other Ambulatory Visit: Payer: Self-pay | Admitting: *Deleted

## 2021-08-14 ENCOUNTER — Other Ambulatory Visit: Payer: Self-pay

## 2021-08-14 DIAGNOSIS — Z7902 Long term (current) use of antithrombotics/antiplatelets: Secondary | ICD-10-CM | POA: Diagnosis not present

## 2021-08-14 DIAGNOSIS — I480 Paroxysmal atrial fibrillation: Secondary | ICD-10-CM | POA: Diagnosis not present

## 2021-08-14 DIAGNOSIS — I471 Supraventricular tachycardia: Secondary | ICD-10-CM | POA: Diagnosis not present

## 2021-08-14 DIAGNOSIS — I252 Old myocardial infarction: Secondary | ICD-10-CM | POA: Diagnosis not present

## 2021-08-14 DIAGNOSIS — K567 Ileus, unspecified: Secondary | ICD-10-CM | POA: Diagnosis not present

## 2021-08-14 DIAGNOSIS — I10 Essential (primary) hypertension: Secondary | ICD-10-CM | POA: Diagnosis not present

## 2021-08-14 DIAGNOSIS — G629 Polyneuropathy, unspecified: Secondary | ICD-10-CM | POA: Diagnosis not present

## 2021-08-14 DIAGNOSIS — E119 Type 2 diabetes mellitus without complications: Secondary | ICD-10-CM | POA: Diagnosis not present

## 2021-08-14 DIAGNOSIS — R69 Illness, unspecified: Secondary | ICD-10-CM | POA: Diagnosis not present

## 2021-08-14 NOTE — Patient Outreach (Signed)
[facility]) Care Management Telephonic RN Care Manager Note   08/14/2021 Name:  Billy [name] MRN:  [id number] DOB:  August 06, 1966   Subjective: Billy [name] is an 55 y.o. year old male who is a primary patient of [place], Annie Main, MD. The care management team was consulted for assistance with care management and/or care coordination needs.    Telephonic RN Care Manager completed Telephone Visit today.   Objective:  Medications Reviewed Today     Reviewed by Emmaline Life, NP (Nurse Practitioner) on 08/12/21 at 1639  Med List Status: <None>   Medication Order Taking? Sig Documenting Provider Last Dose Status Informant  acetaminophen (TYLENOL) 325 MG tablet [id number] Yes Take 2 tablets (650 mg total) by mouth every 6 (six) hours as needed for fever, headache or mild pain. Nani Skillern, PA-C Taking Active   alprazolam Duanne Moron) 2 MG tablet [id number] Yes Take 1 tablet (2 mg total) by mouth 3 (three) times daily as needed for anxiety. Nita Sells, MD Taking Active Self  apixaban (ELIQUIS) 5 MG TABS tablet [id number]  Take 1 tablet (5 mg total) by mouth 2 (two) times daily. Loel Dubonnet, NP  Active   atorvastatin (LIPITOR) 80 MG tablet [id number]  Take 1 tablet (80 mg total) by mouth daily. Swinyer, Lanice Schwab, NP  Active   B-D ULTRAFINE III SHORT PEN 31G X 8 MM MISC [id number] Yes USE TO ADMINISTER INSULIN DAILY E11..80 [provider] Taking Active Self           Med Note Nash Mantis, [name]   Wed Jun 30, 2017  6:58 PM)    carvedilol (COREG) 6.25 MG tablet [id number]  Take 1 tablet (6.25 mg total) by mouth 2 (two) times daily with a meal. Swinyer, Lanice Schwab, NP  Active   clopidogrel (PLAVIX) 75 MG tablet [id number]  Take 1 tablet (75 mg total) by mouth daily. Swinyer, Lanice Schwab, NP  Active   gabapentin (NEURONTIN) 100 MG capsule [id number] No Take 2 capsules by mouth 2 (two) times daily.  Patient not taking: Reported on 08/12/2021   [provider] Not Taking Active   hydrALAZINE (APRESOLINE) 25 MG tablet [id number]  Take 0.5 tablets (12.5 mg total) by mouth 3 (three) times daily. Swinyer, Lanice Schwab, NP  Active   isosorbide mononitrate (IMDUR) 30 MG 24 hr tablet [id number]  Take 0.5 tablets (15 mg total) by mouth daily. Swinyer, Lanice Schwab, NP  Active   Lancets (FREESTYLE) lancets [id number] Yes CHECK BLOOD SUGAR 3 TIMES DAILY [provider] Taking Active Self           Med Note Nash Mantis, [name]   Wed Jun 30, 2017  6:58 PM)    linagliptin (TRADJENTA) 5 MG TABS tablet [id number] No Take 5 mg by mouth daily. Reported on 09/09/2015  Patient not taking: Reported on 08/12/2021   [provider] Not Taking Active   MAGNESIUM CITRATE PO [id number] Yes Take 1 scoop by mouth daily as needed. [provider] Taking Active Self  meclizine (ANTIVERT) 12.5 MG tablet [id number] Yes Take 1 tablet (12.5 mg total) by mouth 2 (two) times daily as needed for dizziness. Regino Giovanni, PA-C Taking Active   Multiple Vitamin (MULTIVITAMIN WITH MINERALS) TABS tablet [id number] Yes Take 1 tablet by mouth daily. [provider] Taking Active Self  oxyCODONE (OXY IR/ROXICODONE) 5 MG immediate release tablet [id number] Yes Take 1 tablet (5 mg total) by mouth every 12 (twelve) hours as needed for  up to 7 days for severe pain. Joffrey Giovanni, PA-C Taking Active   sertraline (ZOLOFT) 50 MG tablet [id number] Yes Take 1 tablet (50 mg total) by mouth daily. Kolton Giovanni, PA-C Taking Active   tamsulosin (FLOMAX) 0.4 MG CAPS capsule [id number] Yes Take 1 capsule (0.4 mg total) by mouth at bedtime. Abdulkadir Giovanni, PA-C Taking Active   traZODone (DESYREL) 100 MG tablet [id number] Yes Take 100 mg by mouth at bedtime. Per pt - takes 75 mg (splits tablet in half; and then quarters - then takes 3/4 of tablet nightly) [provider] Taking Active Self  TRESIBA FLEXTOUCH 100 UNIT/ML FlexTouch Pen [id number] Yes Inject 0-30 Units into the  skin. Inject 10-30 units subQ PRN. Checks CBG with Libre. Gives insulin if "sugar is high" [provider] Taking Active Self           Patient Active Problem List   Diagnosis Date Noted   Pressure injury of skin 07/18/2021   Cardiogenic shock (HCC)    AKI (acute kidney injury) ([place])    Acute respiratory failure with hypoxia (HCC)    S/P CABG x 4 07/08/2021   STEMI (ST elevation myocardial infarction) ([place]) 07/07/2021   Exertional dyspnea 06/30/2021   SVT (supraventricular tachycardia) ([place]) 06/30/2021   Chest pain 06/25/2021   Renal insufficiency 06/25/2021   Hypertensive urgency 06/25/2021   S/P amputation of lesser toe, right ([place]) 07/13/2017   Chronic osteomyelitis of toe, right ([place])    Anxiety and depression 06/30/2017   Left shoulder pain 06/30/2017   Marijuana dependence ([place]) 06/30/2017   Diabetes mellitus with ulcer of toe ([place]) 06/05/2012   Hypertension 06/05/2012   Past Medical History:  Diagnosis Date   Anxiety    Depression    Diabetic neuropathy ([place])    Diabetic retinopathy ([place])    visual impairment   Dysrhythmia    "palpatations sometimes" (06/30/2017)   Exertional dyspnea 06/30/2021   Family history of adverse reaction to anesthesia    sister had OR 2016; "couldn't wake up; could hear what they were saying but couldn't get their attention; like I was paralyzed but fully awake" (06/30/2017)   Pneumonia X 2   SVT (supraventricular tachycardia) ([place])    Toe ulcer ([place]) 06/30/2017   2nd digit   Type II diabetes mellitus ([place])      SDOH:  (Social Determinants of Health) assessments and interventions performed:  SDOH Interventions    Flowsheet Row Most Recent Value  SDOH Interventions   Food Insecurity Interventions Intervention Not Indicated  Financial Strain Interventions Intervention Not Indicated  Housing Interventions Intervention Not Indicated  Intimate Partner Violence Interventions Intervention Not Indicated  Stress Interventions  Intervention Not Indicated  Social Connections Interventions Intervention Not Indicated  Transportation Interventions Intervention Not Indicated       Care Plan  Review of patient past medical history, allergies, medications, health status, including review of consultants reports, laboratory and other test data, was performed as part of comprehensive evaluation for care management services.   Care Plan : RN Care Manager Plan of Care  Updates made by Barbaraann Faster, RN since 08/14/2021 12:00 AM     Problem: Complex Care Coordination Needs and disease management in patient with STEMI, DM, HTN   Priority: High     Long-Range Goal: Establish Plan of Care for Management Complex SDOH Barriers, disease management and Care Coordination Needs in patient with STEMI, DM, HTN   This Visit's Progress: On track  Recent Progress: On track  Priority: High  Note:   Current Barriers:  Knowledge Deficits related to plan of care for management of HTN, DMII, and STEMI  Care Coordination needs related to Limited education about medications, diet changes, HTN, DM, STEMI* Summary: Billy [name] reports he is doing well at the time of the outreach He does report some initial concerns with an elevation in his blood pressure (BP) earlier in the morning that has now resolved  He was visited, assessed and assisted by Ebony Hail, home health physical therapist  He reports when Ebony Hail initially arrived his BP was around 200 /100, then 152/90-100 - His MD had been called and HH PT had been postponed for today  He reports at 1 pm he rechecked and it was 125/80  Had taken BP medicines late Other vitals were pulse ox 95% heart rate 78-82, denies fever  Cardiac symptoms He reports noticing 1-2 hours after taking hypertension medicines he gets dizzy  RN CM discussed the importance of checking the BP prior to taking the medicine to prevent episodes of hypotension He voiced understanding Billy [name] discussed the  anticipation of attending cardiac rehab after the completion of home health services   Nutrition Meals to be delivered today Now reading food labels  Changing diet stop use of ranch and sour cream  Better food choices  Good support from family and church members  Recommendations/Changes made from today's visit: Discussed importance of taking BP before taking BP pills to prevent hypotension episodes  RN CM Clinical Goal(s):  Patient will demonstrate Improved adherence to prescribed treatment plan for HTN, DMII, and STEMI as evidenced by prevention of re admission in next 30 days  through collaboration with RN Care manager, provider, and care team.   Interventions: Follow outreaches for further care coordination and disease management/education Provided the 24 hour nurse call center number [phone] for worsening symptoms after office hours/weekends Inter-disciplinary care team collaboration (see longitudinal plan of care) Evaluation of current treatment plan related to  self management and patient's adherence to plan as established by provider   Diabetes Interventions:  (Status:  Condition stable.  Not addressed this visit.) Short Term Goal Assessed patient's understanding of A1c goal: <7% Reviewed medications with patient and discussed importance of medication adherence Lab Results  Component Value Date   HGBA1C 6.7 (H) 07/07/2021   STEMI  (Status:  New goal.)  Long Term Goal Evaluation of current treatment plan related to  STEMI , Limited education about STEMI* self-management and patient's adherence to plan as established by provider. Discussed plans with patient for ongoing care management follow up and provided patient with direct contact information for care management team Evaluation of current treatment plan related to NSTEMI and patient's adherence to plan as established by provider Provided education to patient re: hypotension prevention Assessed social determinant of  health barriers  Hypertension Interventions:  (Status:  Goal on track:  Yes.) Long Term Goal Last practice recorded BP readings:  BP Readings from Last 3 Encounters:  08/14/21 125/80  08/12/21 136/88  08/06/21 (!) 152/102  Most recent eGFR/CrCl:  Lab Results  Component Value Date   EGFR 33 (L) 08/12/2021    No components found for: CRCL  Evaluation of current treatment plan related to hypertension self management and patient's adherence to plan as established by provider Reviewed medications with patient and discussed importance of compliance  Patient Goals/Self-Care Activities: Take all medications as prescribed Attend all scheduled provider appointments Call pharmacy for medication refills 3-7 days in advance of  running out of medications Perform all self care activities independently  Perform IADL's (shopping, preparing meals, housekeeping, managing finances) independently Call provider office for new concerns or questions   Follow Up Plan:  The patient has been provided with contact information for the care management team and has been advised to call with any health related questions or concerns.  The care management team will reach out to the patient again over the next 7 business days.       Plan: The care management team will reach out to the patient again over the next 7 business days.  Kadarius Cuffe L. Lavina Hamman, RN, BSN, [employer] Coordinator Office number [phone] [facility] number [phone] Fax number [phone]

## 2021-08-15 ENCOUNTER — Telehealth (HOSPITAL_COMMUNITY): Payer: Self-pay

## 2021-08-15 ENCOUNTER — Ambulatory Visit (INDEPENDENT_AMBULATORY_CARE_PROVIDER_SITE_OTHER): Payer: Self-pay | Admitting: Thoracic Surgery (Cardiothoracic Vascular Surgery)

## 2021-08-15 DIAGNOSIS — Z951 Presence of aortocoronary bypass graft: Secondary | ICD-10-CM

## 2021-08-15 NOTE — Telephone Encounter (Signed)
Called patient to see if he is interested in the Cardiac Rehab Program. Patient expressed interest. Explained scheduling process and went over insurance, patient verbalized understanding. Will contact patient for scheduling once f/u has been completed.  °

## 2021-08-15 NOTE — Progress Notes (Signed)
     [address]       [place] [address]             [phone]       Patient: Home Provider: Office Consent for Telemedicine visit obtained.  Today's visit was completed via a real-time telehealth (see specific modality noted below). The patient/authorized person provided oral consent at the time of the visit to engage in a telemedicine encounter with the present provider at [facility]. The patient/authorized person was informed of the potential benefits, limitations, and risks of telemedicine. The patient/authorized person expressed understanding that the laws that protect confidentiality also apply to telemedicine. The patient/authorized person acknowledged understanding that telemedicine does not provide emergency services and that he or she would need to call 911 or proceed to the nearest hospital for help if such a need arose.   Total time spent in the clinical discussion 10 minutes.  Telehealth Modality: Phone visit (audio only)  I had a telephone visit with  Billy [name] who is s/p emergency CABG.  Overall doing well.  Pain is minimal.  Ambulating well, but still has some dizziness.  This is slowly improving. Vitals have been  stable.  Billy [name] will see [place] back in 1 month with a chest x-ray for cardiac rehab clearance.  Billy [name] [name]

## 2021-08-15 NOTE — Telephone Encounter (Signed)
Will check insurance benefits closer to scheduling and/or into the new year 2023. 

## 2021-08-18 ENCOUNTER — Encounter (HOSPITAL_BASED_OUTPATIENT_CLINIC_OR_DEPARTMENT_OTHER): Payer: Self-pay

## 2021-08-19 DIAGNOSIS — D6869 Other thrombophilia: Secondary | ICD-10-CM | POA: Diagnosis not present

## 2021-08-19 DIAGNOSIS — I480 Paroxysmal atrial fibrillation: Secondary | ICD-10-CM | POA: Diagnosis not present

## 2021-08-19 DIAGNOSIS — I10 Essential (primary) hypertension: Secondary | ICD-10-CM | POA: Diagnosis not present

## 2021-08-19 DIAGNOSIS — E1151 Type 2 diabetes mellitus with diabetic peripheral angiopathy without gangrene: Secondary | ICD-10-CM | POA: Diagnosis not present

## 2021-08-19 DIAGNOSIS — G629 Polyneuropathy, unspecified: Secondary | ICD-10-CM | POA: Diagnosis not present

## 2021-08-19 DIAGNOSIS — Z7902 Long term (current) use of antithrombotics/antiplatelets: Secondary | ICD-10-CM | POA: Diagnosis not present

## 2021-08-19 DIAGNOSIS — E1165 Type 2 diabetes mellitus with hyperglycemia: Secondary | ICD-10-CM | POA: Diagnosis not present

## 2021-08-19 DIAGNOSIS — K567 Ileus, unspecified: Secondary | ICD-10-CM | POA: Diagnosis not present

## 2021-08-19 DIAGNOSIS — I4891 Unspecified atrial fibrillation: Secondary | ICD-10-CM | POA: Diagnosis not present

## 2021-08-19 DIAGNOSIS — Z794 Long term (current) use of insulin: Secondary | ICD-10-CM | POA: Diagnosis not present

## 2021-08-19 DIAGNOSIS — E785 Hyperlipidemia, unspecified: Secondary | ICD-10-CM | POA: Diagnosis not present

## 2021-08-19 DIAGNOSIS — E669 Obesity, unspecified: Secondary | ICD-10-CM | POA: Diagnosis not present

## 2021-08-19 DIAGNOSIS — E119 Type 2 diabetes mellitus without complications: Secondary | ICD-10-CM | POA: Diagnosis not present

## 2021-08-19 DIAGNOSIS — R69 Illness, unspecified: Secondary | ICD-10-CM | POA: Diagnosis not present

## 2021-08-19 DIAGNOSIS — Z89421 Acquired absence of other right toe(s): Secondary | ICD-10-CM | POA: Diagnosis not present

## 2021-08-19 DIAGNOSIS — I471 Supraventricular tachycardia: Secondary | ICD-10-CM | POA: Diagnosis not present

## 2021-08-19 DIAGNOSIS — E1142 Type 2 diabetes mellitus with diabetic polyneuropathy: Secondary | ICD-10-CM | POA: Diagnosis not present

## 2021-08-19 DIAGNOSIS — E1122 Type 2 diabetes mellitus with diabetic chronic kidney disease: Secondary | ICD-10-CM | POA: Diagnosis not present

## 2021-08-19 DIAGNOSIS — I2510 Atherosclerotic heart disease of native coronary artery without angina pectoris: Secondary | ICD-10-CM | POA: Diagnosis not present

## 2021-08-19 DIAGNOSIS — I252 Old myocardial infarction: Secondary | ICD-10-CM | POA: Diagnosis not present

## 2021-08-19 DIAGNOSIS — Z7901 Long term (current) use of anticoagulants: Secondary | ICD-10-CM | POA: Diagnosis not present

## 2021-08-19 DIAGNOSIS — I700 Atherosclerosis of aorta: Secondary | ICD-10-CM | POA: Diagnosis not present

## 2021-08-21 ENCOUNTER — Encounter: Payer: Self-pay | Admitting: *Deleted

## 2021-08-21 ENCOUNTER — Other Ambulatory Visit: Payer: Self-pay | Admitting: *Deleted

## 2021-08-21 ENCOUNTER — Other Ambulatory Visit: Payer: Self-pay

## 2021-08-21 ENCOUNTER — Telehealth: Payer: Self-pay | Admitting: Cardiovascular Disease

## 2021-08-21 DIAGNOSIS — I10 Essential (primary) hypertension: Secondary | ICD-10-CM | POA: Diagnosis not present

## 2021-08-21 DIAGNOSIS — G629 Polyneuropathy, unspecified: Secondary | ICD-10-CM | POA: Diagnosis not present

## 2021-08-21 DIAGNOSIS — E119 Type 2 diabetes mellitus without complications: Secondary | ICD-10-CM | POA: Diagnosis not present

## 2021-08-21 DIAGNOSIS — Z7902 Long term (current) use of antithrombotics/antiplatelets: Secondary | ICD-10-CM | POA: Diagnosis not present

## 2021-08-21 DIAGNOSIS — K567 Ileus, unspecified: Secondary | ICD-10-CM | POA: Diagnosis not present

## 2021-08-21 DIAGNOSIS — I252 Old myocardial infarction: Secondary | ICD-10-CM | POA: Diagnosis not present

## 2021-08-21 DIAGNOSIS — R69 Illness, unspecified: Secondary | ICD-10-CM | POA: Diagnosis not present

## 2021-08-21 DIAGNOSIS — I471 Supraventricular tachycardia: Secondary | ICD-10-CM | POA: Diagnosis not present

## 2021-08-21 DIAGNOSIS — I480 Paroxysmal atrial fibrillation: Secondary | ICD-10-CM | POA: Diagnosis not present

## 2021-08-21 NOTE — Patient Outreach (Signed)
[facility]) Care Management Telephonic RN Care Manager Note     08/14/2021 Name:  Billy [name]           MRN:  [id number]      DOB:  05/28/1966   Summary [name] returned a call, left a voice message to update RN CM of a episode of a low BP noted during his home health visit today with outreach to his cardiologist The recommendations were to discontinue the apresoline and make it prn for SBP >130 and to only take the meclizine if he has severe dizziness RN CM returned a call to him  RN CM reviewed these recommendations with him using the teach back method,      Subjective: Billy [name] is an 55 y.o. year old male who is a primary patient of [place], Annie Main, MD. The care management team was consulted for assistance with care management and/or care coordination needs.     Telephonic RN Care Manager completed Telephone Visit today.   Transition of care services noted to be completed by primary care MD office staff Dr [facility] Transition of Care will be completed by primary care provider office who will refer to [facility] care management if needed.  Objective:  Medications Reviewed Today     Reviewed by Emmaline Life, NP (Nurse Practitioner) on 08/12/21 at 1639  Med List Status: <None>   Medication Order Taking? Sig Documenting Provider Last Dose Status Informant  acetaminophen (TYLENOL) 325 MG tablet [id number] Yes Take 2 tablets (650 mg total) by mouth every 6 (six) hours as needed for fever, headache or mild pain. Nani Skillern, PA-C Taking Active   alprazolam Duanne Moron) 2 MG tablet [id number] Yes Take 1 tablet (2 mg total) by mouth 3 (three) times daily as needed for anxiety. Nita Sells, MD Taking Active Self  apixaban (ELIQUIS) 5 MG TABS tablet [id number]  Take 1 tablet (5 mg total) by mouth 2 (two) times daily. Loel Dubonnet, NP  Active   atorvastatin (LIPITOR) 80 MG tablet [id number]  Take 1 tablet (80 mg total) by  mouth daily. Swinyer, Lanice Schwab, NP  Active   B-D ULTRAFINE III SHORT PEN 31G X 8 MM MISC [id number] Yes USE TO ADMINISTER INSULIN DAILY E11..26 [provider] Taking Active Self           Med Note Nash Mantis, [name]   Wed Jun 30, 2017  6:58 PM)    carvedilol (COREG) 6.25 MG tablet [id number]  Take 1 tablet (6.25 mg total) by mouth 2 (two) times daily with a meal. Swinyer, Lanice Schwab, NP  Active   clopidogrel (PLAVIX) 75 MG tablet [id number]  Take 1 tablet (75 mg total) by mouth daily. Swinyer, Lanice Schwab, NP  Active   gabapentin (NEURONTIN) 100 MG capsule [id number] No Take 2 capsules by mouth 2 (two) times daily.  Patient not taking: Reported on 08/12/2021   [provider] Not Taking Active   hydrALAZINE (APRESOLINE) 25 MG tablet [id number]  Take 0.5 tablets (12.5 mg total) by mouth 3 (three) times daily. Swinyer, Lanice Schwab, NP  Active   isosorbide mononitrate (IMDUR) 30 MG 24 hr tablet [id number]  Take 0.5 tablets (15 mg total) by mouth daily. Swinyer, Lanice Schwab, NP  Active   Lancets (FREESTYLE) lancets [id number] Yes CHECK BLOOD SUGAR 3 TIMES DAILY [provider] Taking Active Self           Med Note (BERGER, [name]  Wed Jun 30, 2017  6:58 PM)    linagliptin (TRADJENTA) 5 MG TABS tablet [id number] No Take 5 mg by mouth daily. Reported on 09/09/2015  Patient not taking: Reported on 08/12/2021   [provider] Not Taking Active   MAGNESIUM CITRATE PO [id number] Yes Take 1 scoop by mouth daily as needed. [provider] Taking Active Self  meclizine (ANTIVERT) 12.5 MG tablet [id number] Yes Take 1 tablet (12.5 mg total) by mouth 2 (two) times daily as needed for dizziness. Afshin Giovanni, PA-C Taking Active   Multiple Vitamin (MULTIVITAMIN WITH MINERALS) TABS tablet [id number] Yes Take 1 tablet by mouth daily. [provider] Taking Active Self  oxyCODONE (OXY IR/ROXICODONE) 5 MG immediate release tablet [id number] Yes Take 1 tablet (5 mg total)  by mouth every 12 (twelve) hours as needed for up to 7 days for severe pain. Destine Giovanni, PA-C Taking Active   sertraline (ZOLOFT) 50 MG tablet [id number] Yes Take 1 tablet (50 mg total) by mouth daily. Kasim Giovanni, PA-C Taking Active   tamsulosin (FLOMAX) 0.4 MG CAPS capsule [id number] Yes Take 1 capsule (0.4 mg total) by mouth at bedtime. Tayjon Giovanni, PA-C Taking Active   traZODone (DESYREL) 100 MG tablet [id number] Yes Take 100 mg by mouth at bedtime. Per pt - takes 75 mg (splits tablet in half; and then quarters - then takes 3/4 of tablet nightly) [provider] Taking Active Self  TRESIBA FLEXTOUCH 100 UNIT/ML FlexTouch Pen [id number] Yes Inject 0-30 Units into the skin. Inject 10-30 units subQ PRN. Checks CBG with Libre. Gives insulin if "sugar is high" [provider] Taking Active Self             SDOH:  (Social Determinants of Health) assessments and interventions performed:  SDOH Interventions    Flowsheet Row Most Recent Value  SDOH Interventions   Food Insecurity Interventions Intervention Not Indicated  Financial Strain Interventions Intervention Not Indicated  Stress Interventions Intervention Not Indicated  Transportation Interventions Intervention Not Indicated      Patient Active Problem List   Diagnosis Date Noted   Pressure injury of skin 07/18/2021   Cardiogenic shock ([place])    AKI (acute kidney injury) ([place])    Acute respiratory failure with hypoxia ([place])    S/P CABG x 4 07/08/2021   STEMI (ST elevation myocardial infarction) ([place]) 07/07/2021   Exertional dyspnea 06/30/2021   SVT (supraventricular tachycardia) ([place]) 06/30/2021   Chest pain 06/25/2021   Renal insufficiency 06/25/2021   Hypertensive urgency 06/25/2021   S/P amputation of lesser toe, right ([place]) 07/13/2017   Chronic osteomyelitis of toe, right ([place])    Anxiety and depression 06/30/2017   Left shoulder pain 06/30/2017   Marijuana dependence ([place]) 06/30/2017   Diabetes  mellitus with ulcer of toe ([place]) 06/05/2012   Hypertension 06/05/2012    Care Plan  Review of patient past medical history, allergies, medications, health status, including review of consultants reports, laboratory and other test data, was performed as part of comprehensive evaluation for care management services.   Care Plan : RN Care Manager Plan of Care  Updates made by Barbaraann Faster, RN since 08/21/2021 12:00 AM     Problem: Complex Care Coordination Needs and disease management in patient with STEMI, DM, HTN   Priority: High     Long-Range Goal: Establish Plan of Care for Management Complex SDOH Barriers, disease management and Care Coordination Needs in patient with STEMI, DM, HTN   This Visit's Progress:  On track  Recent Progress: On track  Priority: High  Note:   Current Barriers:  Knowledge Deficits related to plan of care for management of HTN, DMII, and STEMI  Care Coordination needs related to Limited education about medications, diet changes, HTN, DM, STEMI* Barriers: Health Behaviors Last admission 10/24 to 08/06/21 08/21/21 morning episode of hypotension prior during home health visit initiating a call to cardiology office   RN CM Clinical Goal(s):  Patient will demonstrate Improved adherence to prescribed treatment plan for HTN, DMII, and STEMI as evidenced by prevention of re admission in next 30 days  through collaboration with RN Care manager, provider, and care team.    Interventions: Follow outreaches for further assessment of worsening symptoms, care coordination and disease management/education needs Provided the 24 hour nurse call center number [phone] for worsening symptoms after office hours/weekends Inter-disciplinary care team collaboration (see longitudinal plan of care) Evaluation of current treatment plan related to  self management and patient's adherence to plan as established by provider   Diabetes Interventions:  (Status:  Goal on  track:  Yes.) Short Term Goal Assessed patient's understanding of A1c goal: <7% Reviewed medications with patient and discussed importance of medication adherence Discussed plans with patient for ongoing care management follow up and provided patient with direct contact information for care management team Lab Results  Component Value Date   HGBA1C 6.7 (H) 07/07/2021   STEMI  (Status:  New goal.)  Long Term Goal Evaluation of current treatment plan related to  STEMI , Limited education about STEMI* self-management and patient's adherence to plan as established by provider. Discussed plans with patient for ongoing care management follow up and provided patient with direct contact information for care management team Evaluation of current treatment plan related to NSTEMI and patient's adherence to plan as established by provider Provided education to patient re: hypotension prevention Assessed social determinant of health barriers  Hypertension Interventions:  (Status:  Goal on track:  Yes.) Long Term Goal Last practice recorded BP readings:  BP Readings from Last 3 Encounters:  08/14/21 125/80  08/12/21 136/88  08/06/21 (!) 152/102  Most recent eGFR/CrCl:  Lab Results  Component Value Date   EGFR 33 (L) 08/12/2021    No components found for: CRCL  Evaluation of current treatment plan related to hypertension self management and patient's adherence to plan as established by provider Reviewed medications with patient and discussed importance of compliance  Patient Goals/Self-Care Activities: Take all medications as prescribed Attend all scheduled provider appointments Call pharmacy for medication refills 3-7 days in advance of running out of medications Perform all self care activities independently  Perform IADL's (shopping, preparing meals, housekeeping, managing finances) independently Call provider office for new concerns or questions   Follow Up Plan:  The patient has been  provided with contact information for the care management team and has been advised to call with any health related questions or concerns.  The care management team will reach out to the patient again over the next 7 business days.        Plan: The patient has been provided with contact information for the care management team and has been advised to call with any health related questions or concerns.  The care management team will reach out to the patient again over the next 30 business days.  Samanthajo Payano L. Lavina Hamman, RN, BSN, [place] Coordinator Office number [phone] Main [facility] number [phone] Fax number [phone]

## 2021-08-21 NOTE — Patient Outreach (Signed)
[facility]) Care Management Telephonic RN Care Manager Note     08/14/2021 Name:  KORAN SEABROOK           MRN:  [id number]      DOB:  12-09-1965     Subjective: Billy [name] is an 55 y.o. year old male who is a primary patient of [place], Annie Main, MD. The care management team was consulted for assistance with care management and/or care coordination needs.     Telephonic RN Care Manager completed Telephone Visit today.    [facility] Unsuccessful outreach   Outreach attempt to the listed at the preferred outreach number in [place] No answer. [facility] RN CM left HIPAA [facility] Portability and Accountability Act) compliant voicemail message along with CM's contact info.   Plan: [facility] RN CM scheduled this patient for another call attempt within 4-7 business days Unsuccessful outreach on 08/21/21   Zaida Reiland L. Lavina Hamman, RN, BSN, [place] Coordinator Office number [phone] Mobile number [phone]  Main [facility] number [phone] Fax number [phone]

## 2021-08-21 NOTE — Telephone Encounter (Signed)
   Pt c/o BP issue: STAT if pt c/o blurred vision, one-sided weakness or slurred speech  1. What are your last 5 BP readings? 127/73, 80/60  2. Are you having any other symptoms (ex. Dizziness, headache, blurred vision, passed out)? Dizziness  3. What is your BP issue? Pt said, when he woke up this morning his BP was 126/73, when his home health nurse came his BP dropped to 80/60 and every time he stands up he fells dizzy.

## 2021-08-21 NOTE — Telephone Encounter (Signed)
Returned pt.s call after consulting with Christen Bame, NP. Since pts. Heart rate is consistently over 60 bpm, pt, was advised to switch his hydralazine from tid daily to as needled up to three time per day for blood pressures consistently higher than 244 systolic.    Also reviewed orthostatic hypotension with the patient and tips to avoid the dizzy feeling!    Pt. Verbalized understanding and will let us know if he needs up prior to his appointment!

## 2021-08-22 DIAGNOSIS — E785 Hyperlipidemia, unspecified: Secondary | ICD-10-CM | POA: Diagnosis not present

## 2021-08-22 DIAGNOSIS — I129 Hypertensive chronic kidney disease with stage 1 through stage 4 chronic kidney disease, or unspecified chronic kidney disease: Secondary | ICD-10-CM | POA: Diagnosis not present

## 2021-08-22 DIAGNOSIS — E113599 Type 2 diabetes mellitus with proliferative diabetic retinopathy without macular edema, unspecified eye: Secondary | ICD-10-CM | POA: Diagnosis not present

## 2021-08-22 DIAGNOSIS — I4891 Unspecified atrial fibrillation: Secondary | ICD-10-CM | POA: Diagnosis not present

## 2021-08-22 DIAGNOSIS — I2510 Atherosclerotic heart disease of native coronary artery without angina pectoris: Secondary | ICD-10-CM | POA: Diagnosis not present

## 2021-08-22 DIAGNOSIS — I255 Ischemic cardiomyopathy: Secondary | ICD-10-CM | POA: Diagnosis not present

## 2021-08-22 DIAGNOSIS — R69 Illness, unspecified: Secondary | ICD-10-CM | POA: Diagnosis not present

## 2021-08-22 DIAGNOSIS — N1832 Chronic kidney disease, stage 3b: Secondary | ICD-10-CM | POA: Diagnosis not present

## 2021-08-22 DIAGNOSIS — I700 Atherosclerosis of aorta: Secondary | ICD-10-CM | POA: Diagnosis not present

## 2021-08-22 DIAGNOSIS — N170 Acute kidney failure with tubular necrosis: Secondary | ICD-10-CM | POA: Diagnosis not present

## 2021-08-22 LAB — HEMOGLOBIN A1C: A1c: 5.6

## 2021-08-23 DIAGNOSIS — Z7902 Long term (current) use of antithrombotics/antiplatelets: Secondary | ICD-10-CM | POA: Diagnosis not present

## 2021-08-23 DIAGNOSIS — I10 Essential (primary) hypertension: Secondary | ICD-10-CM | POA: Diagnosis not present

## 2021-08-23 DIAGNOSIS — G629 Polyneuropathy, unspecified: Secondary | ICD-10-CM | POA: Diagnosis not present

## 2021-08-23 DIAGNOSIS — I480 Paroxysmal atrial fibrillation: Secondary | ICD-10-CM | POA: Diagnosis not present

## 2021-08-23 DIAGNOSIS — E119 Type 2 diabetes mellitus without complications: Secondary | ICD-10-CM | POA: Diagnosis not present

## 2021-08-23 DIAGNOSIS — R69 Illness, unspecified: Secondary | ICD-10-CM | POA: Diagnosis not present

## 2021-08-23 DIAGNOSIS — I252 Old myocardial infarction: Secondary | ICD-10-CM | POA: Diagnosis not present

## 2021-08-23 DIAGNOSIS — K567 Ileus, unspecified: Secondary | ICD-10-CM | POA: Diagnosis not present

## 2021-08-23 DIAGNOSIS — I471 Supraventricular tachycardia: Secondary | ICD-10-CM | POA: Diagnosis not present

## 2021-08-26 DIAGNOSIS — I10 Essential (primary) hypertension: Secondary | ICD-10-CM | POA: Diagnosis not present

## 2021-08-26 DIAGNOSIS — Z7902 Long term (current) use of antithrombotics/antiplatelets: Secondary | ICD-10-CM | POA: Diagnosis not present

## 2021-08-26 DIAGNOSIS — R69 Illness, unspecified: Secondary | ICD-10-CM | POA: Diagnosis not present

## 2021-08-26 DIAGNOSIS — I252 Old myocardial infarction: Secondary | ICD-10-CM | POA: Diagnosis not present

## 2021-08-26 DIAGNOSIS — G629 Polyneuropathy, unspecified: Secondary | ICD-10-CM | POA: Diagnosis not present

## 2021-08-26 DIAGNOSIS — I471 Supraventricular tachycardia: Secondary | ICD-10-CM | POA: Diagnosis not present

## 2021-08-26 DIAGNOSIS — I480 Paroxysmal atrial fibrillation: Secondary | ICD-10-CM | POA: Diagnosis not present

## 2021-08-26 DIAGNOSIS — E119 Type 2 diabetes mellitus without complications: Secondary | ICD-10-CM | POA: Diagnosis not present

## 2021-08-26 DIAGNOSIS — K567 Ileus, unspecified: Secondary | ICD-10-CM | POA: Diagnosis not present

## 2021-08-27 ENCOUNTER — Encounter (HOSPITAL_COMMUNITY): Payer: Medicare HMO

## 2021-08-27 ENCOUNTER — Encounter (HOSPITAL_BASED_OUTPATIENT_CLINIC_OR_DEPARTMENT_OTHER): Payer: Self-pay

## 2021-08-27 NOTE — Progress Notes (Incomplete)
ADVANCED HF CLINIC CONSULT NOTE   Primary Care: Billy Bowen, MD Primary Cardiologist: Dr. Oval [name] HF Cardiologist: Dr. Aundra [name]  HPI: [name] is a 55 y.o. with a history of visually impaired, DM, HTN, depression, anxiety, SVT, CKD Stage II, PAF, and CAD.    Admitted 06/24/21 with chest pain and hypertensive urgency. Cardiology consulted. Echo completed EF 50-55%. Had brief run of A fib with EMS but was in [place] on arrival.  Billy [name] was recommended. Discharged to home 06/25/21.    Saw Dr Billy [name] on 06/30/21. He was supposed to wear Zio patch for 7 days then he was to start carvedilol 12.5 mg twice a day. During the visit he wanted to know what he could do without medications.    LHC 07/07/21  1st Diag-2 lesion is 100% stenosed.   1st Diag-1 lesion is 90% stenosed.   Prox LAD lesion is 65% stenosed.   Mid LAD lesion is 50% stenosed.   Dist RCA lesion is 80% stenosed.   Dist Cx lesion is 80% stenosed.   A drug-eluting stent was successfully placed using a STENT ONYX FRONTIER 2.5X15.   Admitted with STEMI 07/07/21. Underwent LHC with 3V CAD-->PCI of culprit vessel followed by S/P CABG x4  radial harvest, and maze. Intraoperative EF 20%. Hospitalization c/b recurrent atrial fibrillation requiring IV amiodarone, AKI on CKD IIIb requiring CVVHD and iHD, presumably ischemic ATN in setting of hemodynamic instability post CABG +/- cholesterol emboli.  Repeat Echo with EF 40-45%, mild LVH, mildly decreased RV systolic function, normal IVC. Tunneled HD cath removed and Scr down to 2.74, baseline 1.9 prior to CABG. Discharged to SNF.  Today he returns for HF follow up. Overall feeling fine. Denies increasing SOB, CP, dizziness, edema, or PND/Orthopnea. Appetite ok. No fever or chills. Weight at home 170 pounds. Taking all medications.       Review of Systems: [y] = yes, [ ]  = no   General: Weight gain [ ] ; Weight loss [ ] ; Anorexia [ ] ; Fatigue [ ] ; Fever [ ] ; Chills [ ] ; Weakness [ ]    Cardiac: Chest pain/pressure [ ] ; Resting SOB [ ] ; Exertional SOB [ ] ; Orthopnea [ ] ; Pedal Edema [ ] ; Palpitations [ ] ; Syncope [ ] ; Presyncope [ ] ; Paroxysmal nocturnal dyspnea[ ]   Pulmonary: Cough [ ] ; Wheezing[ ] ; Hemoptysis[ ] ; Sputum [ ] ; Snoring [ ]   GI: Vomiting[ ] ; Dysphagia[ ] ; Melena[ ] ; Hematochezia [ ] ; Heartburn[ ] ; Abdominal pain [ ] ; Constipation [ ] ; Diarrhea [ ] ; BRBPR [ ]   GU: Hematuria[ ] ; Dysuria [ ] ; Nocturia[ ]   Vascular: Pain in legs with walking [ ] ; Pain in feet with lying flat [ ] ; Non-healing sores [ ] ; Stroke [ ] ; TIA [ ] ; Slurred speech [ ] ;  Neuro: Headaches[ ] ; Vertigo[ ] ; Seizures[ ] ; Paresthesias[ ] ;Blurred vision [ ] ; Diplopia [ ] ; Vision changes [ ]   Ortho/Skin: Arthritis [ ] ; Joint pain [ ] ; Muscle pain [ ] ; Joint swelling [ ] ; Back Pain [ ] ; Rash [ ]   Psych: Depression[ ] ; Anxiety[ ]   Heme: Bleeding problems [ ] ; Clotting disorders [ ] ; Anemia [ ]   Endocrine: Diabetes [ ] ; Thyroid dysfunction[ ]    Past Medical History:  Diagnosis Date   Anxiety    Depression    Diabetic neuropathy (HCC)    Diabetic retinopathy ([place])    visual impairment   Dysrhythmia    "palpatations sometimes" (06/30/2017)   Exertional dyspnea 06/30/2021   Family history of adverse reaction to anesthesia  sister had OR 2016; "couldn't wake up; could hear what they were saying but couldn't get their attention; like I was paralyzed but fully awake" (06/30/2017)   Pneumonia X 2   SVT (supraventricular tachycardia) ([place])    Toe ulcer ([place]) 06/30/2017   2nd digit   Type II diabetes mellitus ([place])     Current Outpatient Medications  Medication Sig Dispense Refill   acetaminophen (TYLENOL) 325 MG tablet Take 2 tablets (650 mg total) by mouth every 6 (six) hours as needed for fever, headache or mild pain.     alprazolam (XANAX) 2 MG tablet Take 1 tablet (2 mg total) by mouth 3 (three) times daily as needed for anxiety. 2 tablet 0   apixaban (ELIQUIS) 5 MG TABS tablet Take 1 tablet  (5 mg total) by mouth 2 (two) times daily. 90 tablet 3   atorvastatin (LIPITOR) 80 MG tablet Take 1 tablet (80 mg total) by mouth daily. 90 tablet 3   B-D ULTRAFINE III SHORT PEN 31G X 8 MM MISC USE TO ADMINISTER INSULIN DAILY E11..39  2   carvedilol (COREG) 6.25 MG tablet Take 1 tablet (6.25 mg total) by mouth 2 (two) times daily with a meal. 180 tablet 3   clopidogrel (PLAVIX) 75 MG tablet Take 1 tablet (75 mg total) by mouth daily. 90 tablet 3   gabapentin (NEURONTIN) 100 MG capsule Take 2 capsules by mouth 2 (two) times daily. (Patient not taking: Reported on 08/12/2021)     hydrALAZINE (APRESOLINE) 25 MG tablet Take 0.5 tablets (12.5 mg total) by mouth 3 (three) times daily. (Patient taking differently: Take 12.5 mg by mouth as needed. Please take as needed for systolic Blood pressures greater than 130 up to 3 times per day) 90 tablet 3   isosorbide mononitrate (IMDUR) 30 MG 24 hr tablet Take 0.5 tablets (15 mg total) by mouth daily. 45 tablet 3   Lancets (FREESTYLE) lancets CHECK BLOOD SUGAR 3 TIMES DAILY  6   linagliptin (TRADJENTA) 5 MG TABS tablet Take 5 mg by mouth daily. Reported on 09/09/2015 (Patient not taking: Reported on 08/12/2021)     MAGNESIUM CITRATE PO Take 1 scoop by mouth daily as needed.     meclizine (ANTIVERT) 12.5 MG tablet Take 1 tablet (12.5 mg total) by mouth 2 (two) times daily as needed for dizziness. 30 tablet 0   Multiple Vitamin (MULTIVITAMIN WITH MINERALS) TABS tablet Take 1 tablet by mouth daily.     sertraline (ZOLOFT) 50 MG tablet Take 1 tablet (50 mg total) by mouth daily. 30 tablet 1   tamsulosin (FLOMAX) 0.4 MG CAPS capsule Take 1 capsule (0.4 mg total) by mouth at bedtime. 30 capsule 1   traZODone (DESYREL) 100 MG tablet Take 100 mg by mouth at bedtime. Per pt - takes 75 mg (splits tablet in half; and then quarters - then takes 3/4 of tablet nightly)     TRESIBA FLEXTOUCH 100 UNIT/ML FlexTouch Pen Inject 0-30 Units into the skin. Inject 10-30 units subQ PRN.  Checks CBG with Libre. Gives insulin if "sugar is high"     No current facility-administered medications for this visit.   No Known Allergies  Social History   Socioeconomic History   Marital status: Legally Separated    Spouse name: Not on file   Number of children: Not on file   Years of education: Not on file   Highest education level: Not on file  Occupational History   Occupation: Loss adjuster, chartered  Tobacco Use  Smoking status: Never   Smokeless tobacco: Never  Vaping Use   Vaping Use: Never used  Substance and Sexual Activity   Alcohol use: Not Currently   Drug use: Yes    Types: Marijuana   Sexual activity: Not on file  Other Topics Concern   Not on file  Social History Narrative   Not on file   Social Determinants of Health   Financial Resource Strain: Low Risk    Difficulty of Paying Living Expenses: Not hard at all  Food Insecurity: No Food Insecurity   Worried About Charity fundraiser in the Last Year: Never true   [place] in the Last Year: Never true  Transportation Needs: No Transportation Needs   Lack of Transportation (Medical): No   Lack of Transportation (Non-Medical): No  Physical Activity: Insufficiently Active   Days of Exercise per Week: 5 days   Minutes of Exercise per Session: 20 min  Stress: No Stress Concern Present   Feeling of Stress : Only a little  Social Connections: Moderately Integrated   Frequency of Communication with Friends and Family: More than three times a week   Frequency of Social Gatherings with Friends and Family: More than three times a week   Attends Religious Services: More than 4 times per year   Active Member of [employer] or Organizations: Yes   Attends Music therapist: More than 4 times per year   Marital Status: Separated  Intimate Partner Violence: Not At Risk   Fear of Current or Ex-Partner: No   Emotionally Abused: No   Physically Abused: No   Sexually Abused: No   SH:  Lives alone. Disabled. Visually impaired. Eats natural fertilizer daily. Has 2 sisters with 1 local and she drives him to appointments. His sisters tell me he hesitant about procedures and medications.    Family History  Problem Relation Age of Onset   Heart failure Mother    Diabetes Mother    Pancreatic cancer Father    Diabetes Father    Heart attack Maternal Grandmother    Heart attack Paternal Grandfather    Stroke Cousin    Heart attack Cousin    Colon cancer Neg Hx     There were no vitals filed for this visit.  PHYSICAL EXAM: General:  Well appearing. No respiratory difficulty HEENT: normal Neck: supple. no JVD. Carotids 2+ bilat; no bruits. No lymphadenopathy or thryomegaly appreciated. Cor: PMI nondisplaced. Regular rate & rhythm. No rubs, gallops or murmurs. Lungs: clear Abdomen: soft, nontender, nondistended. No hepatosplenomegaly. No bruits or masses. Good bowel sounds. Extremities: no cyanosis, clubbing, rash, edema Neuro: alert & oriented x 3, cranial nerves grossly intact. moves all 4 extremities w/o difficulty. Affect pleasant.  ECG:   ASSESSMENT & PLAN:  1. CAD: Admitted with anterolateral MI from occluded diagonal treated with DES.  Patient then had CABG with LIMA-LAD, SVG-D, SVG-PDA, and left radial-OM for complete revascularization on 10/25.  No chest pain.  - Now off ASA given need for Plavix and anticoagulation.  - Continue Plavix 75 daily x 6 months post-PCI.  - Continue atorvastatin 80 mg daily.  - Amlodipine for radial harvest stopped with hypotension while on pressors.  - CR after cleared by CVTS.   2. Atrial fibrillation: Paroxysmal, s/p Maze and appendage clipping.  He has been maintaining NSR. - Continue amio 200 mg daily.  - Eliquis 5 mg bid  3. CKD stage 3: Baseline creatinine around 1.6, up to 5 post-op.  He has likely had multiple hits including contrast with initial cath/PCI and possible peri-operative hypotension.  Suspect ATN. Stopped CVVHD  11/4.  11/6 started iHD, 11/08 R IJ tunneled HD catheter placed. Last HD session 11/12.  HD catheter removed 11/17. Scr down further to 2.74. Good UOP.  - BMET today. 4. Chronic systolic CHF: Ischemic cardiomyopathy.  Echo prior to this admission with EF 50-55% (pre-MI and CABG).  Limited echo on 07/11/21 showed EF in 25% range. Intraoperative TEE with EF 20%. Off pressors. Echo 07/28/21 showed EF improved to 40-45% with mildly decreased RV systolic function.  He is not volume overloaded on exam.  - Continue hydralazine 12.5 mg tid and Imdur 30 mg daily. - Continue coreg 6.25 mg bid. - No ARN/SGLT2/spironolactone with AKI - Checking orthostatics due to episodes of dizziness - Titrate GDMT further as outpatient if renal function continues to improve. 5. Anemia: No obvious source of bleeding.  - Received IV iron and Aransep. Check CBC today.      Cardiac Medications at discharge: Amiodarone 200 mg daily Eliquis 5 mg BID Plavix 75 mg daily Atorvastatin 80 mg daily Coreg 6.25 mg BID Hydralazine 12.5 mg BID Imdur 30 mg daily

## 2021-08-28 DIAGNOSIS — R69 Illness, unspecified: Secondary | ICD-10-CM | POA: Diagnosis not present

## 2021-08-28 DIAGNOSIS — I10 Essential (primary) hypertension: Secondary | ICD-10-CM | POA: Diagnosis not present

## 2021-08-28 DIAGNOSIS — K567 Ileus, unspecified: Secondary | ICD-10-CM | POA: Diagnosis not present

## 2021-08-28 DIAGNOSIS — G629 Polyneuropathy, unspecified: Secondary | ICD-10-CM | POA: Diagnosis not present

## 2021-08-28 DIAGNOSIS — Z7902 Long term (current) use of antithrombotics/antiplatelets: Secondary | ICD-10-CM | POA: Diagnosis not present

## 2021-08-28 DIAGNOSIS — I480 Paroxysmal atrial fibrillation: Secondary | ICD-10-CM | POA: Diagnosis not present

## 2021-08-28 DIAGNOSIS — E119 Type 2 diabetes mellitus without complications: Secondary | ICD-10-CM | POA: Diagnosis not present

## 2021-08-28 DIAGNOSIS — I471 Supraventricular tachycardia: Secondary | ICD-10-CM | POA: Diagnosis not present

## 2021-08-28 DIAGNOSIS — I252 Old myocardial infarction: Secondary | ICD-10-CM | POA: Diagnosis not present

## 2021-09-02 DIAGNOSIS — I480 Paroxysmal atrial fibrillation: Secondary | ICD-10-CM | POA: Diagnosis not present

## 2021-09-02 DIAGNOSIS — E119 Type 2 diabetes mellitus without complications: Secondary | ICD-10-CM | POA: Diagnosis not present

## 2021-09-02 DIAGNOSIS — R69 Illness, unspecified: Secondary | ICD-10-CM | POA: Diagnosis not present

## 2021-09-02 DIAGNOSIS — K567 Ileus, unspecified: Secondary | ICD-10-CM | POA: Diagnosis not present

## 2021-09-02 DIAGNOSIS — Z7902 Long term (current) use of antithrombotics/antiplatelets: Secondary | ICD-10-CM | POA: Diagnosis not present

## 2021-09-02 DIAGNOSIS — G629 Polyneuropathy, unspecified: Secondary | ICD-10-CM | POA: Diagnosis not present

## 2021-09-02 DIAGNOSIS — I471 Supraventricular tachycardia: Secondary | ICD-10-CM | POA: Diagnosis not present

## 2021-09-02 DIAGNOSIS — I10 Essential (primary) hypertension: Secondary | ICD-10-CM | POA: Diagnosis not present

## 2021-09-02 DIAGNOSIS — I252 Old myocardial infarction: Secondary | ICD-10-CM | POA: Diagnosis not present

## 2021-09-04 ENCOUNTER — Telehealth: Payer: Self-pay | Admitting: Cardiovascular Disease

## 2021-09-04 DIAGNOSIS — G629 Polyneuropathy, unspecified: Secondary | ICD-10-CM | POA: Diagnosis not present

## 2021-09-04 DIAGNOSIS — Z7902 Long term (current) use of antithrombotics/antiplatelets: Secondary | ICD-10-CM | POA: Diagnosis not present

## 2021-09-04 DIAGNOSIS — I10 Essential (primary) hypertension: Secondary | ICD-10-CM | POA: Diagnosis not present

## 2021-09-04 DIAGNOSIS — K567 Ileus, unspecified: Secondary | ICD-10-CM | POA: Diagnosis not present

## 2021-09-04 DIAGNOSIS — I480 Paroxysmal atrial fibrillation: Secondary | ICD-10-CM | POA: Diagnosis not present

## 2021-09-04 DIAGNOSIS — I252 Old myocardial infarction: Secondary | ICD-10-CM | POA: Diagnosis not present

## 2021-09-04 DIAGNOSIS — I471 Supraventricular tachycardia: Secondary | ICD-10-CM | POA: Diagnosis not present

## 2021-09-04 DIAGNOSIS — R69 Illness, unspecified: Secondary | ICD-10-CM | POA: Diagnosis not present

## 2021-09-04 DIAGNOSIS — E119 Type 2 diabetes mellitus without complications: Secondary | ICD-10-CM | POA: Diagnosis not present

## 2021-09-04 NOTE — Telephone Encounter (Signed)
Recommend waiting to take until tomorrow morning as it is so late in the day.   Recommend continued orthostatic precautions: Wear compression stockings Stay well hydrated Make position changes slowly Eat regular meals  Loel Dubonnet, NP

## 2021-09-04 NOTE — Telephone Encounter (Signed)
Called patient to review medication questions. Pt was advised to take Pm meds tonight and resume AM meds in the morning.   Patient also had PT with him at the time of the call and they were concerned about patient being symptomatic during therapies. Lightheadedness with orthostatic hypotension. Will route to provider for further suggestions if needed!

## 2021-09-04 NOTE — Telephone Encounter (Signed)
Recommend he keep a log of BP once per day and report readings in one week.   Ensure taking Hydralazine only PRN for SBP >130.   If he is taking Hydralazine only PRN, recommend stopping Isosorbide Mononitrate (Imdur).   He should continue Coreg at current dose.   Loel Dubonnet, NP

## 2021-09-04 NOTE — Telephone Encounter (Signed)
Spoke with patient and he has been currently taking his hydralazine twice daily. We are going to reduce his hydralazine to PRN only if SBP are >140.   Pt. Is going to keep a log of his Bps for one week and send them in via phone or mychart

## 2021-09-04 NOTE — Telephone Encounter (Signed)
Pt c/o medication issue:  1. Name of Medication: All morning medications   2. How are you currently taking this medication (dosage and times per day)? Missed morning dose  3. Are you having a reaction (difficulty breathing--STAT)? no  4. What is your medication issue?   Pt overslept and missed all of his morning medications, blood pressure is 140/88 sitting and dropping to 90/60 when standing without any nmedication... pt needs advise as to whether it is safe to take morning dose.. please advise

## 2021-09-10 ENCOUNTER — Telehealth: Payer: Self-pay | Admitting: Cardiovascular Disease

## 2021-09-10 DIAGNOSIS — E119 Type 2 diabetes mellitus without complications: Secondary | ICD-10-CM | POA: Diagnosis not present

## 2021-09-10 DIAGNOSIS — I10 Essential (primary) hypertension: Secondary | ICD-10-CM | POA: Diagnosis not present

## 2021-09-10 DIAGNOSIS — I480 Paroxysmal atrial fibrillation: Secondary | ICD-10-CM | POA: Diagnosis not present

## 2021-09-10 DIAGNOSIS — R69 Illness, unspecified: Secondary | ICD-10-CM | POA: Diagnosis not present

## 2021-09-10 DIAGNOSIS — I471 Supraventricular tachycardia: Secondary | ICD-10-CM | POA: Diagnosis not present

## 2021-09-10 DIAGNOSIS — Z7902 Long term (current) use of antithrombotics/antiplatelets: Secondary | ICD-10-CM | POA: Diagnosis not present

## 2021-09-10 DIAGNOSIS — G629 Polyneuropathy, unspecified: Secondary | ICD-10-CM | POA: Diagnosis not present

## 2021-09-10 DIAGNOSIS — I252 Old myocardial infarction: Secondary | ICD-10-CM | POA: Diagnosis not present

## 2021-09-10 DIAGNOSIS — K567 Ileus, unspecified: Secondary | ICD-10-CM | POA: Diagnosis not present

## 2021-09-10 MED ORDER — HYDRALAZINE HCL 25 MG PO TABS: 12.5000 mg | ORAL_TABLET | Freq: Two times a day (BID) | ORAL | 3 refills | Status: DC

## 2021-09-10 NOTE — Telephone Encounter (Signed)
Consulted with Dr. Oval Linsey she recommends the patient stop Imdur and Resume Hydralazine 12.5mg  twice daily!     Called patient no answer left voicemail (ok per DPR) with instructions about medication change and to call if he has any questions.    There is an order in for Cardiac Rehab. Pt. Okay to start when they reach out to him!

## 2021-09-10 NOTE — Telephone Encounter (Signed)
Pt wants to know if he can move forward with scheduling his Cardiac Rehab appts at [facility]. Please advise the pt further

## 2021-09-10 NOTE — Telephone Encounter (Signed)
Patient sent in his Blood pressure readings for review and would also like to know if he is able to move forward with starting Cardiac rehab?

## 2021-09-10 NOTE — Telephone Encounter (Signed)
Pt returning phone call... please advise  

## 2021-09-10 NOTE — Telephone Encounter (Signed)
Spoke with patient to clarify new medication instructions. Pt. Verbalizes understanding and will continue to monitor his blood pressure and record it for [place]. He is going to send [place] new readings in 1 week.

## 2021-09-10 NOTE — Telephone Encounter (Signed)
Pt is calling to report his BP Readings:  09/05/21--146/103  09/05/21--144/91  09/06/21--155/103 09/06/21--154/104  09/07/21--144/102 09/07/21--150/96  09/09/21--167/102 09/09/21--161/112  09/10/21--133/89 09/10/21--150/100 09/10/21--117/77

## 2021-09-17 DIAGNOSIS — I471 Supraventricular tachycardia: Secondary | ICD-10-CM | POA: Diagnosis not present

## 2021-09-17 DIAGNOSIS — E119 Type 2 diabetes mellitus without complications: Secondary | ICD-10-CM | POA: Diagnosis not present

## 2021-09-17 DIAGNOSIS — I252 Old myocardial infarction: Secondary | ICD-10-CM | POA: Diagnosis not present

## 2021-09-17 DIAGNOSIS — I10 Essential (primary) hypertension: Secondary | ICD-10-CM | POA: Diagnosis not present

## 2021-09-17 DIAGNOSIS — G629 Polyneuropathy, unspecified: Secondary | ICD-10-CM | POA: Diagnosis not present

## 2021-09-17 DIAGNOSIS — R69 Illness, unspecified: Secondary | ICD-10-CM | POA: Diagnosis not present

## 2021-09-17 DIAGNOSIS — K567 Ileus, unspecified: Secondary | ICD-10-CM | POA: Diagnosis not present

## 2021-09-17 DIAGNOSIS — I480 Paroxysmal atrial fibrillation: Secondary | ICD-10-CM | POA: Diagnosis not present

## 2021-09-17 DIAGNOSIS — Z7902 Long term (current) use of antithrombotics/antiplatelets: Secondary | ICD-10-CM | POA: Diagnosis not present

## 2021-09-18 ENCOUNTER — Encounter (HOSPITAL_BASED_OUTPATIENT_CLINIC_OR_DEPARTMENT_OTHER): Payer: Self-pay

## 2021-09-22 ENCOUNTER — Other Ambulatory Visit: Payer: Self-pay

## 2021-09-22 ENCOUNTER — Encounter (HOSPITAL_BASED_OUTPATIENT_CLINIC_OR_DEPARTMENT_OTHER): Payer: Self-pay | Admitting: Cardiovascular Disease

## 2021-09-22 ENCOUNTER — Ambulatory Visit (HOSPITAL_BASED_OUTPATIENT_CLINIC_OR_DEPARTMENT_OTHER): Payer: Medicare HMO | Admitting: Cardiovascular Disease

## 2021-09-22 VITALS — BP 122/68 | HR 93 | Ht 74.0 in | Wt 238.0 lb

## 2021-09-22 DIAGNOSIS — I10 Essential (primary) hypertension: Secondary | ICD-10-CM

## 2021-09-22 DIAGNOSIS — I5043 Acute on chronic combined systolic (congestive) and diastolic (congestive) heart failure: Secondary | ICD-10-CM | POA: Diagnosis not present

## 2021-09-22 DIAGNOSIS — I471 Supraventricular tachycardia: Secondary | ICD-10-CM | POA: Diagnosis not present

## 2021-09-22 DIAGNOSIS — I5042 Chronic combined systolic (congestive) and diastolic (congestive) heart failure: Secondary | ICD-10-CM

## 2021-09-22 HISTORY — DX: Chronic combined systolic (congestive) and diastolic (congestive) heart failure: I50.42

## 2021-09-22 LAB — BASIC METABOLIC PANEL
BUN/Creatinine Ratio: 12 (ref 9–20)
BUN: 23 mg/dL (ref 6–24)
CO2: 23 mmol/L (ref 20–29)
Calcium: 8.7 mg/dL (ref 8.7–10.2)
Chloride: 106 mmol/L (ref 96–106)
Creatinine, Ser: 1.95 mg/dL — ABNORMAL HIGH (ref 0.76–1.27)
Glucose: 241 mg/dL — ABNORMAL HIGH (ref 70–99)
Potassium: 4.8 mmol/L (ref 3.5–5.2)
Sodium: 143 mmol/L (ref 134–144)
eGFR: 40 mL/min/{1.73_m2} — ABNORMAL LOW (ref >59)

## 2021-09-22 MED ORDER — DAPAGLIFLOZIN PROPANEDIOL 10 MG PO TABS: 10.0000 mg | ORAL_TABLET | Freq: Every day | ORAL | 1 refills | Status: DC

## 2021-09-22 NOTE — Assessment & Plan Note (Signed)
Blood pressures well-controlled in the office today.  He brought his machine and we discovered that he has been hanging his arm down to check his blood pressures at home.  I think this is why his pressures are running high.  Continue carvedilol and hydralazine.  He is not on an ACE inhibitors/ARB due to renal dysfunction and the fact that he require dialysis in the hospital.  He follows closely with nephrology.

## 2021-09-22 NOTE — Assessment & Plan Note (Signed)
Likely ischemic cardiomyopathy.  He had a STEMI while undergoing stress testing in the office.  He had PCI and now CABG.  LVEF improved from 20 to 25% up to 40 to 45%.  He is euvolemic and doing well.  We did discuss limiting his fluids to 2 L.  He also has chronic kidney disease and intermittently required HD in the hospital.  We will start Farxiga 10 mg daily.  Continue carvedilol.  No ARB/Entresto given his recent acute on chronic kidney disease.  Repeat echo in 4 months.

## 2021-09-22 NOTE — Progress Notes (Signed)
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 means that a person needs to limit the amount of fluid he or she drinks each day due to certain health conditions. The amount of fluid that you are allowed each day may depend on kidney function and other factors. It is important to carefully measure and keep track of the amount of fluid that you consume each day. Your fluid intake includes all liquids that you drink, as well as any foods that become liquid at room temperature, such as ice cream, ice cubes, and gelatin. You may be taking in too much fluid if your weight increases, your body starts to swell, or you have trouble breathing. This information is not intended to replace advice given to you by your health care provider. Make sure you discuss any questions you have with your health care provider. Document Revised: 06/18/2020 Document Reviewed: 06/18/2020 Elsevier Patient Education  2022 [employer].     [employer] Stumpf,acting as a Education administrator for Skeet Latch, MD.,have documented all relevant documentation on the behalf of Skeet Latch, MD,as directed by  Skeet Latch, MD while in the presence of Skeet Latch, MD.   I, Channahon Oval Linsey, MD have reviewed all documentation for this visit.  The documentation of the exam, diagnosis, procedures, and orders on 09/23/2021 are all accurate and complete.   Signed, Skeet Latch, MD  09/23/2021 3:04 PM    [facility]

## 2021-09-22 NOTE — Assessment & Plan Note (Signed)
Symptoms stable on carvedilol.

## 2021-09-22 NOTE — Patient Instructions (Addendum)
Medication Instructions:  START FARXIGA 10 MG DAILY  THIS IS FOR YOUR HEART DISEASE, DIABETES, AND KIDNEY DISEASE  THE GOAL FOR TAKING THIS IS TO LOWER THE RISK OF NEEDING DIALYSIS IN THE FUTURE   *If you need a refill on your cardiac medications before your next appointment, please call your pharmacy*  Lab Work: BMET TODAY   If you have labs (blood work) drawn today and your tests are completely normal, you will receive your results only by: [place] (if you have MyChart) OR A paper copy in the mail If you have any lab test that is abnormal or we need to change your treatment, we will call you to review the results.  Testing/Procedures: Your physician has requested that you have an echocardiogram. Echocardiography is a painless test that uses sound waves to create images of your heart. It provides your doctor with information about the size and shape of your heart and how well your hearts chambers and valves are working. This procedure takes approximately one hour. There are no restrictions for this procedure.  IN 4 MONTHS   Follow-Up: At [facility], you and your health needs are our priority.  As part of our continuing mission to provide you with exceptional heart care, we have created designated Provider Care Teams.  These Care Teams include your primary Cardiologist (physician) and Advanced Practice Providers (APPs -  Physician Assistants and Nurse Practitioners) who all work together to provide you with the care you need, when you need it.  We recommend signing up for the patient portal called "MyChart".  Sign up information is provided on this After Visit Summary.  MyChart is used to connect with patients for Virtual Visits (Telemedicine).  Patients are able to view lab/test results, encounter notes, upcoming appointments, etc.  Non-urgent messages can be sent to your provider as well.   To learn more about what you can do with MyChart, go to [email or web address].     Your next appointment:   4 month(s) AFTER ECHO   The format for your next appointment:   In Person  Provider:   Skeet Latch, MD   LIMIT YOUR FLUID INTAKE TO 2 LITERS A DAY   Fluid Restriction Fluid restriction means that a person needs to limit the amount of fluid he or she drinks each day due to certain health conditions. The amount of fluid that a person is allowed each day (fluid allowance) may depend on several things, such as: Kidney function. How much fluid the body is holding on to (retaining). Blood pressure. Heart function. Blood sodium level. It is important to carefully measure and keep track of the amount of fluid that is consumed each day. What is my plan? Your health care provider recommends that you limit your fluid intake to __2 LITERS (ABOUT 67 OUNCES)__ per day. What counts toward my fluid intake? Your fluid intake includes all liquids that you drink and any foods that become liquid at room temperature. Examples of some fluids that you will have to limit include: Tea, coffee, soda, lemonade, milk, water, juice, sports drinks, and nutritional supplement beverages. Alcoholic beverages. Cream. Gravy. Ice cubes. Soup and broth. The following are examples of foods that become liquid at room temperature. These foods will also count toward your fluid intake. Ice cream and ice milk. Frozen yogurt and sherbet. Frozen ice pops. Flavored gelatin. How do I keep track of my fluid intake? Each morning, fill a jug with the amount of water that is equal to your  daily fluid allowance. You can use this water as a guideline for fluid allowance. Each time you take in any form of fluid (including ice cubes and foods that become liquid at room temperature), pour an equal amount of water out of the container. This helps you to see how much fluid you are consuming and how much more fluid you can take in during the rest of the day. The following conversions may also be helpful  in measuring your fluid intake: 1 cup equals 8 oz (240 mL).  cup equals 6 oz (180 mL). ? cup equals 5? oz (160 mL).  cup equals 4 oz (120 mL). ? cup equals 2? oz (80 mL).  cup equals 2 oz (60 mL). 2 Tbsp equals 1 oz (30 mL). What are tips for following this plan? General instructions Make sure that you stay within your recommended fluid allowance each day. Always measure and keep track of your fluids, including ice cubes and foods that become liquid at room temperature. Use small cups and glasses and learn to sip fluids slowly. Try eating frozen fruits between meals, such as grapes or strawberries. These can satisfy thirst without adding to your fluid intake. Swallow your pills with meals or soft foods, such as applesauce or mashed potatoes, instead of with liquids. Doing this helps you save your fluid allowance for something that you enjoy. Weigh yourself each day   Weigh yourself every day. Keeping track of your daily weight can help you and your health care provider notice as soon as possible if your body is retaining fluid. Follow this sequence every morning: Urinate. Weigh yourself. Eat breakfast. Wear the same amount of clothing each time you weigh yourself. Write down your daily weight. Give this weight record to your health care provider. If your weight is going up, you may be retaining too much fluid. Every 1 lb (0.45 kg) of body weight that you gain is a sign that your body is retaining 2 cups (480 mL) of fluid.  Manage your thirst Add lemon juice or a slice of fresh lemon to water or ice. Doing this helps to satisfy your thirst. Freeze fruit juice or water in an ice cube tray. Use this as part of your fluid allowance. These cubes are useful for quenching your thirst. Before you freeze the juice or water, measure how much liquid you use to fill a cube section of the ice tray. Subtract this amount from your day's allowance each time you consume a frozen cube. Avoid salty, or  high sodium, foods. These foods make you thirsty and make it more difficult to stay within your daily fluid allowance. Keep the temperature in your home at a cooler level. Keep the air in your home as humid as possible. Dry air increases thirst. Avoid being out in the hot sun. This can cause you to sweat and become thirsty. To help avoid dry mouth, brush your teeth often or rinse out your mouth with mouthwash. Lemon wedges, hard sour candies, chewing gum, or breath spray may also help to moisten your mouth. What are some signs that I may be taking in too much fluid? You may be taking in too much fluid if: Your weight increases. Contact your health care provider if you gain weight rapidly. Your face, hands, legs, feet, and abdomen start to swell. You have trouble breathing. Summary Fluid restriction means that a person needs to limit the amount of fluid he or she drinks each day due to certain health conditions.  The amount of fluid that you are allowed each day may depend on kidney function and other factors. It is important to carefully measure and keep track of the amount of fluid that you consume each day. Your fluid intake includes all liquids that you drink, as well as any foods that become liquid at room temperature, such as ice cream, ice cubes, and gelatin. You may be taking in too much fluid if your weight increases, your body starts to swell, or you have trouble breathing. This information is not intended to replace advice given to you by your health care provider. Make sure you discuss any questions you have with your health care provider. Document Revised: 06/18/2020 Document Reviewed: 06/18/2020 Elsevier Patient Education  2022 [employer].

## 2021-09-22 NOTE — Assessment & Plan Note (Signed)
Status post D1 PCI.  He also had CABG for multivessel CAD 06/2021.  He is doing well and will start cardiac rehab soon.  He is not on aspirin given that he is on Eliquis.  Continue clopidogrel.  Continue atorvastatin.  LDL goal is less than 70.

## 2021-09-22 NOTE — Telephone Encounter (Signed)
Spoke with patient and assured him that he is clear to start cardiac rehab. Patient states he has his orientation appointment this week!

## 2021-09-23 ENCOUNTER — Other Ambulatory Visit: Payer: Self-pay | Admitting: *Deleted

## 2021-09-23 ENCOUNTER — Telehealth (HOSPITAL_COMMUNITY): Payer: Self-pay

## 2021-09-23 ENCOUNTER — Encounter (HOSPITAL_BASED_OUTPATIENT_CLINIC_OR_DEPARTMENT_OTHER): Payer: Self-pay | Admitting: Cardiovascular Disease

## 2021-09-23 DIAGNOSIS — I252 Old myocardial infarction: Secondary | ICD-10-CM | POA: Diagnosis not present

## 2021-09-23 DIAGNOSIS — E119 Type 2 diabetes mellitus without complications: Secondary | ICD-10-CM | POA: Diagnosis not present

## 2021-09-23 DIAGNOSIS — Z7902 Long term (current) use of antithrombotics/antiplatelets: Secondary | ICD-10-CM | POA: Diagnosis not present

## 2021-09-23 DIAGNOSIS — G629 Polyneuropathy, unspecified: Secondary | ICD-10-CM | POA: Diagnosis not present

## 2021-09-23 DIAGNOSIS — R69 Illness, unspecified: Secondary | ICD-10-CM | POA: Diagnosis not present

## 2021-09-23 DIAGNOSIS — I480 Paroxysmal atrial fibrillation: Secondary | ICD-10-CM | POA: Diagnosis not present

## 2021-09-23 DIAGNOSIS — I471 Supraventricular tachycardia: Secondary | ICD-10-CM | POA: Diagnosis not present

## 2021-09-23 DIAGNOSIS — K567 Ileus, unspecified: Secondary | ICD-10-CM | POA: Diagnosis not present

## 2021-09-23 DIAGNOSIS — I10 Essential (primary) hypertension: Secondary | ICD-10-CM | POA: Diagnosis not present

## 2021-09-23 NOTE — Telephone Encounter (Signed)
Called to confirm/remind patient of their appointment at the [facility] on 09/24/21.   Patient reminded to bring all medications and/or complete list.  Confirmed patient has transportation. Gave directions, instructed to utilize [address] parking.  Confirmed appointment prior to ending call.

## 2021-09-23 NOTE — Patient Outreach (Addendum)
[facility]) Care Management  09/23/2021  Billy [name] 1966-07-27 [id number]   [facility] Unsuccessful outreach   Outreach attempt to the listed at the preferred outreach number in EPIC  No answer. [facility] RN CM left HIPAA [facility] Portability and Accountability Act) compliant voicemail message along with CMs contact info.   Plan: [facility] RN CM scheduled this patient for another call attempt within 4-7 business days Unsuccessful outreach on 09/23/21   [name] L. Lavina Hamman, RN, BSN, [place] Coordinator Office number [phone] Mobile number [phone]  Main [facility] number [phone] Fax number [phone]

## 2021-09-24 ENCOUNTER — Ambulatory Visit (INDEPENDENT_AMBULATORY_CARE_PROVIDER_SITE_OTHER): Payer: Self-pay | Admitting: Physician Assistant

## 2021-09-24 ENCOUNTER — Encounter: Payer: Self-pay | Admitting: Physician Assistant

## 2021-09-24 ENCOUNTER — Encounter (HOSPITAL_COMMUNITY): Payer: Self-pay

## 2021-09-24 ENCOUNTER — Other Ambulatory Visit: Payer: Self-pay

## 2021-09-24 ENCOUNTER — Ambulatory Visit
Admission: RE | Admit: 2021-09-24 | Discharge: 2021-09-24 | Disposition: A | Discharge status: Home / Self Care | Payer: Medicare HMO | Source: Ambulatory Visit | Attending: Thoracic Surgery (Cardiothoracic Vascular Surgery) | Admitting: Thoracic Surgery (Cardiothoracic Vascular Surgery)

## 2021-09-24 ENCOUNTER — Ambulatory Visit (HOSPITAL_COMMUNITY)
Admission: RE | Admit: 2021-09-24 | Discharge: 2021-09-24 | Disposition: A | Discharge status: Home / Self Care | Payer: Medicare HMO | Source: Ambulatory Visit | Attending: Family Medicine | Admitting: Family Medicine

## 2021-09-24 ENCOUNTER — Other Ambulatory Visit: Payer: Self-pay | Admitting: *Deleted

## 2021-09-24 ENCOUNTER — Other Ambulatory Visit: Payer: Self-pay | Admitting: Thoracic Surgery (Cardiothoracic Vascular Surgery)

## 2021-09-24 VITALS — BP 159/97 | HR 89 | Resp 20 | Ht 74.0 in | Wt 240.0 lb

## 2021-09-24 VITALS — BP 158/98 | HR 93 | Wt 241.8 lb

## 2021-09-24 DIAGNOSIS — N183 Chronic kidney disease, stage 3 unspecified: Secondary | ICD-10-CM | POA: Diagnosis not present

## 2021-09-24 DIAGNOSIS — I5042 Chronic combined systolic (congestive) and diastolic (congestive) heart failure: Secondary | ICD-10-CM

## 2021-09-24 DIAGNOSIS — N1832 Chronic kidney disease, stage 3b: Secondary | ICD-10-CM | POA: Insufficient documentation

## 2021-09-24 DIAGNOSIS — Z951 Presence of aortocoronary bypass graft: Secondary | ICD-10-CM

## 2021-09-24 DIAGNOSIS — I2510 Atherosclerotic heart disease of native coronary artery without angina pectoris: Secondary | ICD-10-CM | POA: Diagnosis not present

## 2021-09-24 DIAGNOSIS — J984 Other disorders of lung: Secondary | ICD-10-CM | POA: Diagnosis not present

## 2021-09-24 DIAGNOSIS — I252 Old myocardial infarction: Secondary | ICD-10-CM | POA: Diagnosis not present

## 2021-09-24 DIAGNOSIS — I4891 Unspecified atrial fibrillation: Secondary | ICD-10-CM | POA: Diagnosis not present

## 2021-09-24 DIAGNOSIS — Z79899 Other long term (current) drug therapy: Secondary | ICD-10-CM | POA: Diagnosis not present

## 2021-09-24 DIAGNOSIS — Z9889 Other specified postprocedural states: Secondary | ICD-10-CM | POA: Diagnosis not present

## 2021-09-24 DIAGNOSIS — D649 Anemia, unspecified: Secondary | ICD-10-CM | POA: Diagnosis not present

## 2021-09-24 DIAGNOSIS — I255 Ischemic cardiomyopathy: Secondary | ICD-10-CM | POA: Diagnosis not present

## 2021-09-24 DIAGNOSIS — I130 Hypertensive heart and chronic kidney disease with heart failure and stage 1 through stage 4 chronic kidney disease, or unspecified chronic kidney disease: Secondary | ICD-10-CM | POA: Diagnosis not present

## 2021-09-24 DIAGNOSIS — Z7901 Long term (current) use of anticoagulants: Secondary | ICD-10-CM | POA: Diagnosis not present

## 2021-09-24 DIAGNOSIS — Z7902 Long term (current) use of antithrombotics/antiplatelets: Secondary | ICD-10-CM | POA: Insufficient documentation

## 2021-09-24 DIAGNOSIS — I480 Paroxysmal atrial fibrillation: Secondary | ICD-10-CM | POA: Diagnosis not present

## 2021-09-24 LAB — BRAIN NATRIURETIC PEPTIDE: B Natriuretic Peptide: 1157.5 pg/mL — ABNORMAL HIGH (ref 0.0–100.0)

## 2021-09-24 MED ORDER — OXYCODONE HCL 5 MG PO TABS: 5.0000 mg | ORAL_TABLET | Freq: Four times a day (QID) | ORAL | 0 refills | Status: DC | PRN

## 2021-09-24 MED ORDER — FUROSEMIDE 20 MG PO TABS: 20.0000 mg | ORAL_TABLET | Freq: Every day | ORAL | 3 refills | Status: DC | PRN

## 2021-09-24 NOTE — Patient Instructions (Signed)
Medication Changes:  Start Lasix 20mg  daily for 3days, then as needed for swelling and shortness of breath.  Lab Work:  Labs done today, your results will be available in MyChart, we will contact you for abnormal readings.   Testing/Procedures:  noe  Referrals:  none  Special Instructions // Education:  none  Follow-Up in: with Dr.McLean 01/20/2022 @ 2.40pm  At the [facility], you and your health needs are our priority. We have a designated team specialized in the treatment of Heart Failure. This Care Team includes your primary Heart Failure Specialized Cardiologist (physician), Advanced Practice Providers (APPs- Physician Assistants and Nurse Practitioners), and Pharmacist who all work together to provide you with the care you need, when you need it.   You may see any of the following providers on your designated Care Team at your next follow up:  Dr Glori Bickers Dr Haynes Kerns, NP Lyda Jester, [place] [facility] [place], [place] Audry Riles, PharmD   Please be sure to bring in all your medications bottles to every appointment.   Need to Contact us:  If you have any questions or concerns before your next appointment please send [place] a message through [place] or call our office at [phone].    TO LEAVE A MESSAGE FOR THE NURSE SELECT OPTION 2, PLEASE LEAVE A MESSAGE INCLUDING: YOUR NAME DATE OF BIRTH CALL BACK NUMBER REASON FOR CALL**this is important as we prioritize the call backs  YOU WILL RECEIVE A CALL BACK THE SAME DAY AS LONG AS YOU CALL BEFORE 4:00 PM

## 2021-09-24 NOTE — Progress Notes (Signed)
ReDS Vest / Clip - 09/24/21 1000       ReDS Vest / Clip   Station Marker D    Ruler Value 35.5    ReDS Value Range High volume overload    ReDS Actual Value 49

## 2021-09-24 NOTE — Progress Notes (Signed)
[address]       [place] [address]             [id number]       HPI: This is a 56 year old male with a past medical history of PAF (s/p LA clip), hypertension, diabetes mellitus, CKD (stage IIIa), SVT (s/p ablation), anxiety, and depression who was found to have a NSTEMI and then underwent coronary artery bypass grafting surgery X 4, left sided MAZE, and LA clip on 07/08/2021 by Dr. [name]. His post op course was notable for a postoperative acute on chronic renal failure and nephrology was consulted to assist with management. He had an acute HD catheter placed for CVVH. Ultimately, creatinine improved and was down to 2.25 at discharge. He was followed by advanced heart failure as well. He was discharged in stable condition on 08/06/2021. He reports he has not had dizziness or lightheadedness since after Christmas.  Creatinine was down to 1.95. on 09/22/2021. He has noted some recent shallow breathing, weight gain, and BNP found to be 1157.5 for which he was prescribed Lasix for the next 3 days then PRN.   Current Outpatient Medications  Medication Sig Dispense Refill   acetaminophen (TYLENOL) 325 MG tablet Take 2 tablets (650 mg total) by mouth every 6 (six) hours as needed for fever, headache or mild pain.     alprazolam (XANAX) 2 MG tablet Take 1 tablet (2 mg total) by mouth 3 (three) times daily as needed for anxiety. 2 tablet 0   apixaban (ELIQUIS) 5 MG TABS tablet Take 1 tablet (5 mg total) by mouth 2 (two) times daily. 90 tablet 3   atorvastatin (LIPITOR) 80 MG tablet Take 1 tablet (80 mg total) by mouth daily. 90 tablet 3   B-D ULTRAFINE III SHORT PEN 31G X 8 MM MISC USE TO ADMINISTER INSULIN DAILY E11..39  2   carvedilol (COREG) 6.25 MG tablet Take 1 tablet (6.25 mg total) by mouth 2 (two) times daily with a meal. 180 tablet 3   clopidogrel (PLAVIX) 75 MG tablet Take 1 tablet (75 mg total) by mouth daily. 90 tablet 3   dapagliflozin propanediol (FARXIGA)  10 MG TABS tablet Take 1 tablet (10 mg total) by mouth daily before breakfast. 90 tablet 1   hydrALAZINE (APRESOLINE) 25 MG tablet Take 0.5 tablets (12.5 mg total) by mouth in the morning and at bedtime. 90 tablet 3   Lancets (FREESTYLE) lancets CHECK BLOOD SUGAR 3 TIMES DAILY  6   MAGNESIUM CITRATE PO Take 1 scoop by mouth daily as needed.     meclizine (ANTIVERT) 12.5 MG tablet Take 1 tablet (12.5 mg total) by mouth 2 (two) times daily as needed for dizziness. 30 tablet 0   tamsulosin (FLOMAX) 0.4 MG CAPS capsule Take 1 capsule (0.4 mg total) by mouth at bedtime. 30 capsule 1   traZODone (DESYREL) 100 MG tablet Take 100 mg by mouth at bedtime. Per pt - takes 75 mg (splits tablet in half; and then quarters - then takes 3/4 of tablet nightly)     TRESIBA FLEXTOUCH 100 UNIT/ML FlexTouch Pen Inject 0-30 Units into the skin. Inject 10-30 units subQ PRN. Checks CBG with Libre. Gives insulin if "sugar is high"    Vital Signs: Vitals:   09/24/21 1406  BP: (!) 159/97  Pulse: 89  Resp: 20  SpO2: 93%      Physical Exam: CV-RRR Pulmonary-Clear to auscultation bilaterally Extremities-Compression stockings, trace LE edema Wounds-Sternal,  LUE, RLE clean, dry, well healed.  Diagnostic Tests: PA/LAT CXR: Narrative & Impression  CLINICAL DATA:  Status post CABG.   EXAM: CHEST - 2 VIEW   COMPARISON:  07/20/2021   FINDINGS: Again noted is elevation of left hemidiaphragm. Linear densities at the left lung base could represent atelectasis or scarring. Few patchy densities at the right lung base. Heart size is within normal limits with post CABG changes. No significant pleural effusions. Negative for pneumothorax.   IMPRESSION: Few linear and patchy densities at lung bases that are most compatible with atelectasis and/or scarring. No significant pleural fluid.   Postsurgical changes.     Electronically Signed   By: Markus Daft M.D.   On: 09/24/2021 14:53      Impression and Plan: He  is hypertensive on this visit, but with complaints of previous dizziness/lightheadedness (orthostatic hypotension), will not titrate medication at this time. If he has continued elevated SBP (he checks his BP regularly), can titrate Hydralazine. He saw Dr. Oval Linsey on 09/22/2021.He had to stop gabapentin, presumably due to his renal dysfunction. He started wearing compression socks. He has been really trying to limit his sodium intake and improve his diet. He was started him on Farxiga 10 mg daily. He had a telephone visit with Dr. [name] on 08/15/2021. His only complaint at that time was dizziness, which has since resolved. We encourage cardiac rehab participation and he wants to do this. He asked for a refill of Oxy (1 po every 6 hours PRN severe pain, #14) and I sent this to his pharmacy on file. He was instructed he will not receive any refills for this. He will return to see Dr. [name] PRN. Of note, he has follow up appointments in May with both Dr. Oval Linsey and Dr. Aundra Dubin.   Nani Skillern, PA-C [facility] [phone]

## 2021-09-24 NOTE — Progress Notes (Signed)
ADVANCED HF CLINIC CONSULT NOTE   Primary Care: Reynold Bowen, MD Primary Cardiologist: Dr. Oval Linsey HF Cardiologist: Dr. Aundra Dubin  HPI: Mr Mcinturff is a 56 y.o. with a history of visually impaired, DM, HTN, depression, anxiety, SVT, CKD Stage II, PAF, CAD and chronic systolic heart failure.    Admitted 06/24/21 with chest pain and hypertensive urgency. Cardiology consulted. Echo completed EF 50-55%. Had brief run of A fib with EMS but was in [place] on arrival.  Elwyn Reach was recommended. Discharged to home 06/25/21.    Saw Dr Oval Linsey on 06/30/21. He was supposed to wear Zio patch for 7 days then he was to start carvedilol 12.5 mg twice a day. During the visit he wanted to know what he could do without medications.    Admitted with STEMI 07/07/21. Underwent LHC with 3V CAD-->PCI of culprit vessel followed by S/P CABG x4  radial harvest, and maze. Intraoperative EF 20%. Hospitalization c/b recurrent atrial fibrillation requiring IV amiodarone, AKI on CKD IIIb requiring CVVHD and iHD, presumably ischemic ATN in setting of hemodynamic instability post CABG +/- cholesterol emboli.  Repeat Echo with EF 40-45%, mild LVH, mildly decreased RV systolic function, normal IVC. Tunneled HD cath removed and Scr down to 2.74, baseline 1.9 prior to CABG. Discharged to SNF.  Today he returns for post hospital HF follow up with his sister. Overall feeling fine, working on getting his strength back and wants to start cardiac rehab. No significant exertional dyspnea. He is uncomfortable lying down at night and endorses /PND/ orthopnea.  Denies CP, dizziness, edema, or palpitations. Appetite ok. No fever or chills. Weight at home 235 pounds. Taking all medications. Saw general cardiology this week and Farxiga started and echo arranged. No SOB walking on flat ground.   ReDs: 49%  ECG (personally reviewed): NSR 88 bpm   Review of Systems: [y] = yes, [ ]  = no   General: Weight gain [ ] ; Weight loss [ ] ; Anorexia [ ] ;  Fatigue [ ] ; Fever [ ] ; Chills [ ] ; Weakness Blue.Reese ]  Cardiac: Chest pain/pressure [ ] ; Resting SOB [ ] ; Exertional SOB [ ] ; Orthopnea Blue.Reese ]; Pedal Edema [ ] ; Palpitations [ ] ; Syncope [ ] ; Presyncope [ ] ; Paroxysmal nocturnal dyspnea[y ]  Pulmonary: Cough [ ] ; Wheezing[ ] ; Hemoptysis[ ] ; Sputum [ ] ; Snoring [ ]   GI: Vomiting[ ] ; Dysphagia[ ] ; Melena[ ] ; Hematochezia [ ] ; Heartburn[ ] ; Abdominal pain [ ] ; Constipation [ ] ; Diarrhea [ ] ; BRBPR [ ]   GU: Hematuria[ ] ; Dysuria [ ] ; Nocturia[ ]   Vascular: Pain in legs with walking [ ] ; Pain in feet with lying flat [ ] ; Non-healing sores [ ] ; Stroke [ ] ; TIA [ ] ; Slurred speech [ ] ;  Neuro: Headaches[ ] ; Vertigo[ ] ; Seizures[ ] ; Paresthesias[ ] ;Blurred vision [ ] ; Diplopia [ ] ; Vision changes [ ]   Ortho/Skin: Arthritis Blue.Reese ]; Joint pain [ ] ; Muscle pain [ ] ; Joint swelling [ ] ; Back Pain [ ] ; Rash [ ]   Psych: Depression[y]; Anxiety[y]  Heme: Bleeding problems [ ] ; Clotting disorders [ ] ; Anemia [ ]   Endocrine: Diabetes [ y]; Thyroid dysfunction[ ]   Past Medical History:  Diagnosis Date   Anxiety    Chronic combined systolic and diastolic heart failure ([place]) 09/22/2021   Depression    Diabetic neuropathy ([place])    Diabetic retinopathy ([place])    visual impairment   Dysrhythmia    "palpatations sometimes" (06/30/2017)   Exertional dyspnea 06/30/2021   Family history of adverse reaction to anesthesia  sister had OR 2016; "couldn't wake up; could hear what they were saying but couldn't get their attention; like I was paralyzed but fully awake" (06/30/2017)   Pneumonia X 2   SVT (supraventricular tachycardia) ([place])    Toe ulcer ([place]) 06/30/2017   2nd digit   Type II diabetes mellitus ([place])    Current Outpatient Medications  Medication Sig Dispense Refill   acetaminophen (TYLENOL) 325 MG tablet Take 2 tablets (650 mg total) by mouth every 6 (six) hours as needed for fever, headache or mild pain.     alprazolam (XANAX) 2 MG tablet Take 1 tablet (2 mg  total) by mouth 3 (three) times daily as needed for anxiety. 2 tablet 0   apixaban (ELIQUIS) 5 MG TABS tablet Take 1 tablet (5 mg total) by mouth 2 (two) times daily. 90 tablet 3   atorvastatin (LIPITOR) 80 MG tablet Take 1 tablet (80 mg total) by mouth daily. 90 tablet 3   B-D ULTRAFINE III SHORT PEN 31G X 8 MM MISC USE TO ADMINISTER INSULIN DAILY E11..39  2   carvedilol (COREG) 6.25 MG tablet Take 1 tablet (6.25 mg total) by mouth 2 (two) times daily with a meal. 180 tablet 3   clopidogrel (PLAVIX) 75 MG tablet Take 1 tablet (75 mg total) by mouth daily. 90 tablet 3   dapagliflozin propanediol (FARXIGA) 10 MG TABS tablet Take 1 tablet (10 mg total) by mouth daily before breakfast. 90 tablet 1   hydrALAZINE (APRESOLINE) 25 MG tablet Take 0.5 tablets (12.5 mg total) by mouth in the morning and at bedtime. 90 tablet 3   Lancets (FREESTYLE) lancets CHECK BLOOD SUGAR 3 TIMES DAILY  6   MAGNESIUM CITRATE PO Take 1 scoop by mouth daily as needed.     meclizine (ANTIVERT) 12.5 MG tablet Take 1 tablet (12.5 mg total) by mouth 2 (two) times daily as needed for dizziness. 30 tablet 0   tamsulosin (FLOMAX) 0.4 MG CAPS capsule Take 1 capsule (0.4 mg total) by mouth at bedtime. 30 capsule 1   traZODone (DESYREL) 100 MG tablet Take 100 mg by mouth at bedtime. Per pt - takes 75 mg (splits tablet in half; and then quarters - then takes 3/4 of tablet nightly)     TRESIBA FLEXTOUCH 100 UNIT/ML FlexTouch Pen Inject 0-30 Units into the skin. Inject 10-30 units subQ PRN. Checks CBG with Libre. Gives insulin if "sugar is high"     No current facility-administered medications for this encounter.   No Known Allergies  Social History   Socioeconomic History   Marital status: Legally Separated    Spouse name: Not on file   Number of children: Not on file   Years of education: Not on file   Highest education level: Not on file  Occupational History   Occupation: Loss adjuster, chartered  Tobacco Use    Smoking status: Never   Smokeless tobacco: Never  Vaping Use   Vaping Use: Never used  Substance and Sexual Activity   Alcohol use: Not Currently   Drug use: Yes    Types: Marijuana   Sexual activity: Not on file  Other Topics Concern   Not on file  Social History Narrative   Not on file   Social Determinants of Health   Financial Resource Strain: Low Risk    Difficulty of Paying Living Expenses: Not hard at all  Food Insecurity: No Food Insecurity   Worried About [place] in the Last Year: Never true  Ran Out of Food in the Last Year: Never true  Transportation Needs: No Transportation Needs   Lack of Transportation (Medical): No   Lack of Transportation (Non-Medical): No  Physical Activity: Insufficiently Active   Days of Exercise per Week: 5 days   Minutes of Exercise per Session: 20 min  Stress: No Stress Concern Present   Feeling of Stress : Only a little  Social Connections: Moderately Integrated   Frequency of Communication with Friends and Family: More than three times a week   Frequency of Social Gatherings with Friends and Family: More than three times a week   Attends Religious Services: More than 4 times per year   Active Member of [employer] or Organizations: Yes   Attends Music therapist: More than 4 times per year   Marital Status: Separated  Intimate Partner Violence: Not At Risk   Fear of Current or Ex-Partner: No   Emotionally Abused: No   Physically Abused: No   Sexually Abused: No   SH: Lives alone. Disabled. Visually impaired. Eats natural fertilizer daily. Has 2 sisters with 1 local and she drives him to appointments. His sisters tell me he hesitant about procedures and medications.    Family History  Problem Relation Age of Onset   Heart failure Mother    Diabetes Mother    Pancreatic cancer Father    Diabetes Father    Heart attack Maternal Grandmother    Heart attack Paternal Grandfather    Stroke Cousin    Heart  attack Cousin    Colon cancer Neg Hx    BP (!) 158/98    Pulse 93    Wt 109.7 kg (241 lb 12.8 oz)    SpO2 95%    BMI 31.05 kg/m   Wt Readings from Last 3 Encounters:  09/24/21 109.7 kg (241 lb 12.8 oz)  09/22/21 108 kg (238 lb)  08/12/21 109.3 kg (241 lb)   PHYSICAL EXAM: General:  NAD. No resp difficulty, mildly anxious HEENT: Normal Neck: Supple. JVP 7-8. Carotids 2+ bilat; no bruits. No lymphadenopathy or thryomegaly appreciated. Cor: PMI nondisplaced. Regular rate & rhythm. No rubs, gallops or murmurs. Lungs: Clear Abdomen: Obese, nontender, nondistended. No hepatosplenomegaly. No bruits or masses. Good bowel sounds. Extremities: No cyanosis, clubbing, rash, edema; compression socks on Neuro: Alert & oriented x 3, cranial nerves grossly intact. Moves all 4 extremities w/o difficulty. Affect pleasant.  ASSESSMENT & PLAN:  1. CAD: Admitted with anterolateral MI from occluded diagonal treated with DES.  Patient then had CABG with LIMA-LAD, SVG-D, SVG-PDA, and left radial-OM for complete revascularization on 10/25.  No chest pain.  - Now off ASA given need for Plavix and anticoagulation.  - Continue Plavix 75 daily x 6 months post-PCI.  - Continue atorvastatin 80 mg daily.  - CR after cleared by CVTS.   2. Chronic systolic CHF: Ischemic cardiomyopathy.  Echo prior to this admission with EF 50-55% (pre-MI and CABG).  Limited echo on 07/11/21 showed EF in 25% range. Intraoperative TEE with EF 20%. Off pressors. Echo 07/28/21 showed EF improved to 40-45% with mildly decreased RV systolic function.  NYHA II-early III symptoms today, he appears at least mildly volume up on exam, ReDs 49%.  - Start Lasix 20 mg daily x 3 days, then can change to PRN weight swelling/SOB.  - Continue Farxiga 10 mg daily. - Continue hydralazine 12.5 mg tid. - Continue coreg 6.25 mg bid. - No ARN/SGLT2/spironolactone with recent AKI and CKD.  - Discussed  importance of daily weights and limiting fluids and  dietary sodium intake. 3. Atrial fibrillation: Paroxysmal, s/p Maze and appendage clipping.  He has been maintaining NSR. - He is now off amiodarone.  - Eliquis 5 mg bid. No bleeding today. 4. CKD stage 3: Baseline creatinine around 1.6, up to 5 post-op.  He has likely had multiple hits including contrast with initial cath/PCI and possible peri-operative hypotension.  Suspect ATN. Required CVVHD then iHD.  - SCr 1.95 and K 4.8 on labs 09/22/21. 5. Anemia: Received IV iron and Aransep. No bleeding issues. - Recent CBC 11.8.   Follow up with Dr. Aundra Dubin after scheduled echo.  Allena Katz, FNP-BC 09/24/21

## 2021-09-25 ENCOUNTER — Telehealth (HOSPITAL_COMMUNITY): Payer: Self-pay | Admitting: Cardiology

## 2021-09-25 DIAGNOSIS — I5042 Chronic combined systolic (congestive) and diastolic (congestive) heart failure: Secondary | ICD-10-CM

## 2021-09-25 MED ORDER — POTASSIUM CHLORIDE CRYS ER 20 MEQ PO TBCR: 20.0000 meq | EXTENDED_RELEASE_TABLET | Freq: Every day | ORAL | 3 refills | Status: DC

## 2021-09-25 MED ORDER — FUROSEMIDE 20 MG PO TABS: 40.0000 mg | ORAL_TABLET | Freq: Every day | ORAL | 3 refills | Status: DC

## 2021-09-25 NOTE — Telephone Encounter (Signed)
Patient called.  Patient aware and voiced understanding Repeat labs 10/03/21 and follow up 2/6

## 2021-09-25 NOTE — Telephone Encounter (Signed)
-----   Message from Rafael Bihari, [place] sent at 09/24/2021  4:31 PM EST ----- BNP is elevated. Change Lasix to 40 mg daily + 20 KCL daily. Will need repeat BMET/BNP in 7-10 days.

## 2021-09-28 ENCOUNTER — Other Ambulatory Visit (HOSPITAL_COMMUNITY): Payer: Self-pay | Admitting: Family Medicine

## 2021-09-30 ENCOUNTER — Other Ambulatory Visit: Payer: Self-pay | Admitting: *Deleted

## 2021-09-30 DIAGNOSIS — N1832 Chronic kidney disease, stage 3b: Secondary | ICD-10-CM | POA: Insufficient documentation

## 2021-09-30 DIAGNOSIS — K760 Fatty (change of) liver, not elsewhere classified: Secondary | ICD-10-CM | POA: Insufficient documentation

## 2021-09-30 DIAGNOSIS — D126 Benign neoplasm of colon, unspecified: Secondary | ICD-10-CM | POA: Insufficient documentation

## 2021-09-30 DIAGNOSIS — I129 Hypertensive chronic kidney disease with stage 1 through stage 4 chronic kidney disease, or unspecified chronic kidney disease: Secondary | ICD-10-CM | POA: Insufficient documentation

## 2021-09-30 DIAGNOSIS — I2510 Atherosclerotic heart disease of native coronary artery without angina pectoris: Secondary | ICD-10-CM | POA: Insufficient documentation

## 2021-09-30 DIAGNOSIS — N170 Acute kidney failure with tubular necrosis: Secondary | ICD-10-CM | POA: Insufficient documentation

## 2021-09-30 DIAGNOSIS — I255 Ischemic cardiomyopathy: Secondary | ICD-10-CM | POA: Insufficient documentation

## 2021-09-30 NOTE — Patient Outreach (Signed)
[facility]) Care Management  09/30/2021  Billy [name] 02-27-1966 [id number]   [facility] Unsuccessful outreach #2      Outreach attempt to the listed at the preferred outreach number in EPIC  No answer. [facility] RN CM left HIPAA [facility] Portability and Accountability Act) compliant voicemail message along with CMs contact info.    Plan: [facility] RN CM scheduled this patient for another call attempt within 4-7+ business days Unsuccessful outreach on 09/23/21, 09/30/21  Unsuccessful outreach letter sent 09/30/21     Joelene Millin L. Lavina Hamman, RN, BSN, [place] Coordinator Office number [phone] Mobile number [phone]  Main [facility] number [phone] Fax number [phone]

## 2021-10-03 ENCOUNTER — Other Ambulatory Visit: Payer: Self-pay

## 2021-10-03 ENCOUNTER — Ambulatory Visit (HOSPITAL_COMMUNITY)
Admission: RE | Admit: 2021-10-03 | Discharge: 2021-10-03 | Disposition: A | Discharge status: Home / Self Care | Payer: Medicare HMO | Source: Ambulatory Visit | Attending: Cardiology | Admitting: Cardiology

## 2021-10-03 ENCOUNTER — Telehealth (HOSPITAL_COMMUNITY): Payer: Self-pay | Admitting: Surgery

## 2021-10-03 ENCOUNTER — Telehealth (HOSPITAL_COMMUNITY): Payer: Self-pay | Admitting: *Deleted

## 2021-10-03 DIAGNOSIS — I5042 Chronic combined systolic (congestive) and diastolic (congestive) heart failure: Secondary | ICD-10-CM | POA: Diagnosis present

## 2021-10-03 LAB — BASIC METABOLIC PANEL
Anion gap: 8 (ref 5–15)
BUN: 30 mg/dL — ABNORMAL HIGH (ref 6–20)
CO2: 29 mmol/L (ref 22–32)
Calcium: 8.8 mg/dL — ABNORMAL LOW (ref 8.9–10.3)
Chloride: 101 mmol/L (ref 98–111)
Creatinine, Ser: 2.51 mg/dL — ABNORMAL HIGH (ref 0.61–1.24)
GFR, Estimated: 29 mL/min — ABNORMAL LOW (ref >60)
Glucose, Bld: 253 mg/dL — ABNORMAL HIGH (ref 70–99)
Potassium: 5.4 mmol/L — ABNORMAL HIGH (ref 3.5–5.1)
Sodium: 138 mmol/L (ref 135–145)

## 2021-10-03 MED ORDER — FUROSEMIDE 20 MG PO TABS: 20.0000 mg | ORAL_TABLET | Freq: Every day | ORAL | 3 refills | Status: DC | PRN

## 2021-10-03 NOTE — Telephone Encounter (Signed)
Billy [name], [place]  10/03/2021  3:18 PM EST Back to Top    Pt returned call he is aware and agreeable with plan. Lab appt scheduled.    Micki Riley, RN  10/03/2021 10:50 AM EST     I attempted to reach patient to review results and recommendations per Allena Katz NP.  I left a message for a return call.   [place], FNP  10/03/2021  9:48 AM EST     K and kidney function elevated.   Stop KCL suppl. & reduce K-rich foods in diet   Stop daily Lasix and reduce to 20 mg PRN swelling/weight gain.   Needs BMET and BNP next week

## 2021-10-03 NOTE — Telephone Encounter (Signed)
-----   Message from Rafael Bihari, [place] sent at 10/03/2021  9:48 AM EST ----- K and kidney function elevated.  Stop KCL suppl. & reduce K-rich foods in diet  Stop daily Lasix and reduce to 20 mg PRN swelling/weight gain.  Needs BMET and BNP next week

## 2021-10-03 NOTE — Telephone Encounter (Signed)
I attempted to reach patient to review results and recommendations per Allena Katz NP.  I left a message for a return call.

## 2021-10-07 ENCOUNTER — Telehealth (HOSPITAL_COMMUNITY): Payer: Self-pay

## 2021-10-07 ENCOUNTER — Other Ambulatory Visit: Payer: Self-pay | Admitting: *Deleted

## 2021-10-07 ENCOUNTER — Encounter (HOSPITAL_COMMUNITY): Payer: Self-pay

## 2021-10-07 NOTE — Patient Outreach (Signed)
[facility]) Care Management  10/07/2021  DERREL MOORE [birth year] [id number]   [facility] Unsuccessful outreach #3      Outreach attempt to the listed at the preferred outreach number in EPIC  No answer. [facility] RN CM left HIPAA [facility] Portability and Accountability Act) compliant voicemail message along with CMs contact info.    Plan: [facility] RN CM scheduled this patient for pending case closure Unsuccessful outreach on 09/23/21, 09/30/21, 10/07/21 Unsuccessful outreach letter sent 09/30/21     Joelene Millin L. Lavina Hamman, RN, BSN, [place] Coordinator Office number [phone] Mobile number [phone]  Main [facility] number [phone] Fax number [phone]

## 2021-10-07 NOTE — Telephone Encounter (Signed)
Pt insurance is active and benefits verified through [facility]. Co-pay $40.00, DED $0.00/$0.00 met, out of pocket $4,500.00/$25.00 met, co-insurance 0%. No pre-authorization required. Passport, 10/07/21 @ 3:55PM, FXJ#[id number]

## 2021-10-07 NOTE — Telephone Encounter (Signed)
Attempted to call patient in regards to Cardiac Rehab - LM on VM Mailed letter 

## 2021-10-10 ENCOUNTER — Ambulatory Visit (HOSPITAL_COMMUNITY)
Admission: RE | Admit: 2021-10-10 | Discharge: 2021-10-10 | Disposition: A | Discharge status: Home / Self Care | Payer: Medicare HMO | Source: Ambulatory Visit | Attending: Internal Medicine | Admitting: Internal Medicine

## 2021-10-10 ENCOUNTER — Telehealth (HOSPITAL_COMMUNITY): Payer: Self-pay

## 2021-10-10 ENCOUNTER — Other Ambulatory Visit: Payer: Self-pay

## 2021-10-10 DIAGNOSIS — I5042 Chronic combined systolic (congestive) and diastolic (congestive) heart failure: Secondary | ICD-10-CM | POA: Insufficient documentation

## 2021-10-10 LAB — BASIC METABOLIC PANEL
Anion gap: 6 (ref 5–15)
BUN: 34 mg/dL — ABNORMAL HIGH (ref 6–20)
CO2: 30 mmol/L (ref 22–32)
Calcium: 8.9 mg/dL (ref 8.9–10.3)
Chloride: 105 mmol/L (ref 98–111)
Creatinine, Ser: 2.17 mg/dL — ABNORMAL HIGH (ref 0.61–1.24)
GFR, Estimated: 35 mL/min — ABNORMAL LOW (ref >60)
Glucose, Bld: 201 mg/dL — ABNORMAL HIGH (ref 70–99)
Potassium: 4.7 mmol/L (ref 3.5–5.1)
Sodium: 141 mmol/L (ref 135–145)

## 2021-10-10 LAB — BRAIN NATRIURETIC PEPTIDE: B Natriuretic Peptide: 1243.7 pg/mL — ABNORMAL HIGH (ref 0.0–100.0)

## 2021-10-10 NOTE — Telephone Encounter (Addendum)
Pt aware, agreeable, and verbalized understanding   ----- Message from Rafael Bihari, FNP sent at 10/10/2021 10:32 AM EST ----- Kidney function improved, bnp elevated. Restart Lasix 20 mg daily. No extra KCL suppl yet. Will recheck labs at follow up visit.

## 2021-10-17 ENCOUNTER — Telehealth (HOSPITAL_COMMUNITY): Payer: Self-pay

## 2021-10-17 NOTE — Telephone Encounter (Signed)
Called and left patient a detailed message to confirm/remind patient of their appointment at the [facility] on 10/20/21.

## 2021-10-17 NOTE — Progress Notes (Signed)
ADVANCED HF CLINIC NOTE   Primary Care: Reynold Bowen, MD Primary Cardiologist: Dr. Oval Linsey Nephrologist: Dr. Moshe Cipro HF Cardiologist: Dr. Aundra Dubin  HPI: Mr Kinslow is a 56 y.o. with a history of visually impaired, DM, HTN, depression, anxiety, SVT, CKD Stage II, PAF, CAD and chronic systolic heart failure.    Admitted 06/24/21 with chest pain and hypertensive urgency. Cardiology consulted. Echo completed EF 50-55%. Had brief run of A fib with EMS but was in [place] on arrival.  Elwyn Reach was recommended. Discharged to home 06/25/21.    Saw Dr Oval Linsey on 06/30/21. He was supposed to wear Zio patch for 7 days then he was to start carvedilol 12.5 mg twice a day. During the visit he wanted to know what he could do without medications.    Admitted with STEMI 07/07/21. Underwent LHC with 3V CAD-->PCI of culprit vessel followed by S/P CABG x4  radial harvest, and maze. Intraoperative EF 20%. Hospitalization c/b recurrent atrial fibrillation requiring IV amiodarone, AKI on CKD IIIb requiring CVVHD and iHD, presumably ischemic ATN in setting of hemodynamic instability post CABG +/- cholesterol emboli.  Repeat Echo with EF 40-45%, mild LVH, mildly decreased RV systolic function, normal IVC. Tunneled HD cath removed and Scr down to 2.74, baseline 1.9 prior to CABG. Discharged to SNF.  Hospital follow up 1/23, doing well with some SOB with walking on flat ground. BNP found to be elevated at 1157 and Lasix 20 mg continued daily.   Today he returns for HF follow up with sister. Overall feeling better. Has chest discomfort with inspiration located under left breastbone. He has mild dyspnea with walking 2 blocks outside. Denies abnormal bleeding, palpitations, dizziness, edema, or PND/Orthopnea. Appetite ok. No fever or chills. Weight at home 238-244 pounds. Taking all medications, has been out of Eliquis x 1 day. Smokes THC nightly. He says his kidney function historically has been around 1.95. Will start CR at  the end of February. Has been trying to limit salt and fluids. BP elevated today but has not had meds this morning.  ReDs: 42%  ECG (personally reviewed): none ordered today.  Labs (1/23): K 4.7, creatinine 2.17  Past Medical History:  Diagnosis Date   Anxiety    Chronic combined systolic and diastolic heart failure ([place]) 09/22/2021   Depression    Diabetic neuropathy (HCC)    Diabetic retinopathy ([place])    visual impairment   Dysrhythmia    "palpatations sometimes" (06/30/2017)   Exertional dyspnea 06/30/2021   Family history of adverse reaction to anesthesia    sister had OR 2016; "couldn't wake up; could hear what they were saying but couldn't get their attention; like I was paralyzed but fully awake" (06/30/2017)   Pneumonia X 2   SVT (supraventricular tachycardia) ([place])    Toe ulcer ([place]) 06/30/2017   2nd digit   Type II diabetes mellitus ([place])    Current Outpatient Medications  Medication Sig Dispense Refill   acetaminophen (TYLENOL) 325 MG tablet Take 2 tablets (650 mg total) by mouth every 6 (six) hours as needed for fever, headache or mild pain.     alprazolam (XANAX) 2 MG tablet Take 1 tablet (2 mg total) by mouth 3 (three) times daily as needed for anxiety. 2 tablet 0   apixaban (ELIQUIS) 5 MG TABS tablet Take 1 tablet (5 mg total) by mouth 2 (two) times daily. 90 tablet 3   atorvastatin (LIPITOR) 80 MG tablet Take 1 tablet (80 mg total) by mouth daily. 90 tablet 3  B-D ULTRAFINE III SHORT PEN 31G X 8 MM MISC USE TO ADMINISTER INSULIN DAILY E11..39  2   carvedilol (COREG) 12.5 MG tablet Take 12.5 mg by mouth 2 (two) times daily.     carvedilol (COREG) 6.25 MG tablet Take 1 tablet (6.25 mg total) by mouth 2 (two) times daily with a meal. 180 tablet 3   clopidogrel (PLAVIX) 75 MG tablet Take 1 tablet (75 mg total) by mouth daily. 90 tablet 3   Continuous Blood Gluc Sensor (FREESTYLE LIBRE 14 DAY SENSOR) MISC Use to check CBG three times daily.  Change sensore every 14  days. DX:  E11.319     dapagliflozin propanediol (FARXIGA) 10 MG TABS tablet Take 1 tablet (10 mg total) by mouth daily before breakfast. 90 tablet 1   furosemide (LASIX) 20 MG tablet TAKE 1 TABLET BY MOUTH EVERY DAY AS NEEDED (Patient taking differently: daily.) 90 tablet 1   hydrALAZINE (APRESOLINE) 25 MG tablet Take 0.5 tablets (12.5 mg total) by mouth in the morning and at bedtime. 90 tablet 3   Insulin Pen Needle (BD PEN NEEDLE NANO 2ND GEN) 32G X 4 MM MISC USE WITH TRESIBA DAILY E11.39     Lancets (FREESTYLE) lancets CHECK BLOOD SUGAR 3 TIMES DAILY  6   meclizine (ANTIVERT) 12.5 MG tablet Take 1 tablet (12.5 mg total) by mouth 2 (two) times daily as needed for dizziness. 30 tablet 0   oxyCODONE (OXY IR/ROXICODONE) 5 MG immediate release tablet Take 1 tablet (5 mg total) by mouth every 6 (six) hours as needed for severe pain. 14 tablet 0   tamsulosin (FLOMAX) 0.4 MG CAPS capsule Take 1 capsule (0.4 mg total) by mouth at bedtime. 30 capsule 1   traZODone (DESYREL) 100 MG tablet Take 100 mg by mouth at bedtime. Per pt - takes 75 mg (splits tablet in half; and then quarters - then takes 3/4 of tablet nightly)     TRESIBA FLEXTOUCH 100 UNIT/ML FlexTouch Pen Inject 0-30 Units into the skin. Inject 10-30 units subQ PRN. Checks CBG with Libre. Gives insulin if "sugar is high"     No current facility-administered medications for this encounter.   Allergies  Allergen Reactions   Metformin Hcl     Other reaction(s): says it makes his legs cramp   Social History   Socioeconomic History   Marital status: Legally Separated    Spouse name: Not on file   Number of children: Not on file   Years of education: Not on file   Highest education level: Not on file  Occupational History   Occupation: Loss adjuster, chartered  Tobacco Use   Smoking status: Never   Smokeless tobacco: Never  Vaping Use   Vaping Use: Never used  Substance and Sexual Activity   Alcohol use: Not Currently   Drug  use: Yes    Types: Marijuana   Sexual activity: Not on file  Other Topics Concern   Not on file  Social History Narrative   Not on file   Social Determinants of Health   Financial Resource Strain: Low Risk    Difficulty of Paying Living Expenses: Not hard at all  Food Insecurity: No Food Insecurity   Worried About Charity fundraiser in the Last Year: Never true   [place] in the Last Year: Never true  Transportation Needs: No Transportation Needs   Lack of Transportation (Medical): No   Lack of Transportation (Non-Medical): No  Physical Activity: Insufficiently Active  Days of Exercise per Week: 5 days   Minutes of Exercise per Session: 20 min  Stress: No Stress Concern Present   Feeling of Stress : Only a little  Social Connections: Moderately Integrated   Frequency of Communication with Friends and Family: More than three times a week   Frequency of Social Gatherings with Friends and Family: More than three times a week   Attends Religious Services: More than 4 times per year   Active Member of [employer] or Organizations: Yes   Attends Music therapist: More than 4 times per year   Marital Status: Separated  Intimate Partner Violence: Not At Risk   Fear of Current or Ex-Partner: No   Emotionally Abused: No   Physically Abused: No   Sexually Abused: No   SH: Lives alone. Disabled. Visually impaired. Eats natural fertilizer daily. Has 2 sisters with 1 local and she drives him to appointments. His sisters tell me he hesitant about procedures and medications.    Family History  Problem Relation Age of Onset   Heart failure Mother    Diabetes Mother    Pancreatic cancer Father    Diabetes Father    Heart attack Maternal Grandmother    Heart attack Paternal Grandfather    Stroke Cousin    Heart attack Cousin    Colon cancer Neg Hx    BP (!) 162/100    Pulse 76    Wt 112.5 kg (248 lb)    SpO2 95%    BMI 31.84 kg/m   Wt Readings from Last 3 Encounters:   10/20/21 112.5 kg (248 lb)  09/24/21 109.7 kg (241 lb 12.8 oz)  09/24/21 108.9 kg (240 lb)   PHYSICAL EXAM: General:  NAD. No resp difficulty, mildly anxious  HEENT: Normal Neck: Supple. JVP 7-8. Carotids 2+ bilat; no bruits. No lymphadenopathy or thryomegaly appreciated. Cor: PMI nondisplaced. Regular rate & rhythm. No rubs, gallops or murmurs. Lungs: Clear Abdomen: Soft, nontender, nondistended. No hepatosplenomegaly. No bruits or masses. Good bowel sounds. Extremities: No cyanosis, clubbing, rash, edema Neuro: Alert & oriented x 3, cranial nerves grossly intact. Moves all 4 extremities w/o difficulty. Affect anxious.   ASSESSMENT & PLAN:  1. CAD: Admitted with anterolateral MI from occluded diagonal treated with DES.  Patient then had CABG with LIMA-LAD, SVG-D, SVG-PDA, and left radial-OM for complete revascularization on 10/25.  His current chest discomfort sounds like MSK, ? Sequela from chest tube removal. - Now off ASA given need for Plavix and anticoagulation.  - Continue Plavix 75 daily x 6 months post-PCI.  - Continue atorvastatin 80 mg daily.  - Start CR at end of the month.  2. Chronic systolic CHF: Ischemic cardiomyopathy.  Echo prior to this admission with EF 50-55% (pre-MI and CABG).  Limited echo on 07/11/21 showed EF in 25% range. Intraoperative TEE with EF 20%. Off pressors. Echo 07/28/21 showed EF improved to 40-45% with mildly decreased RV systolic function.  NYHA II-early III symptoms today, he appears at least mildly volume up on exam, ReDs 42%, weight up 8 lbs.  - Increase Lasix to 30 mg daily (he prefers this instead of 40 daily alternating with 20 every other day). BMET and BNP today, repeat BMET in 10-14 days - Continue Farxiga 10 mg daily.  - Continue hydralazine 12.5 mg tid. - Continue Coreg 12.5 mg bid. - No ARN/SGLT2/spironolactone with recent AKI and CKD.  - Discussed importance of daily weights and limiting fluids and dietary sodium intake. - Repeat  echo  at next visit. 3. Atrial fibrillation: Paroxysmal, s/p Maze and appendage clipping.  He has been maintaining NSR. - He is now off amiodarone.  - Eliquis 5 mg bid. No bleeding today. Given samples today. 4. CKD stage 3: Baseline creatinine around 1.6, up to 5 post-op.  He has likely had multiple hits including contrast with initial cath/PCI and possible peri-operative hypotension.  Suspect ATN. Required CVVHD then iHD.  - BMET today. He needs to make an appt with Dr. Moshe Cipro. 5. Anemia: Received IV iron and Aransep. No bleeding issues. - Recent CBC 11.8.  6. Marijuana dependence: smoking nightly, needs to cut back.  Follow up with Dr. Aundra Dubin after scheduled echo.  Allena Katz, FNP-BC 10/20/21

## 2021-10-19 ENCOUNTER — Other Ambulatory Visit (HOSPITAL_COMMUNITY): Payer: Self-pay | Admitting: Family Medicine

## 2021-10-19 ENCOUNTER — Other Ambulatory Visit: Payer: Self-pay | Admitting: Surgical

## 2021-10-20 ENCOUNTER — Ambulatory Visit (HOSPITAL_COMMUNITY)
Admission: RE | Admit: 2021-10-20 | Discharge: 2021-10-20 | Disposition: A | Discharge status: Home / Self Care | Payer: Medicare HMO | Source: Ambulatory Visit | Attending: Family Medicine | Admitting: Family Medicine

## 2021-10-20 ENCOUNTER — Other Ambulatory Visit: Payer: Self-pay

## 2021-10-20 ENCOUNTER — Encounter (HOSPITAL_COMMUNITY): Payer: Self-pay

## 2021-10-20 VITALS — BP 162/100 | HR 76 | Wt 248.0 lb

## 2021-10-20 DIAGNOSIS — I160 Hypertensive urgency: Secondary | ICD-10-CM | POA: Diagnosis not present

## 2021-10-20 DIAGNOSIS — Z951 Presence of aortocoronary bypass graft: Secondary | ICD-10-CM | POA: Diagnosis not present

## 2021-10-20 DIAGNOSIS — E1122 Type 2 diabetes mellitus with diabetic chronic kidney disease: Secondary | ICD-10-CM | POA: Diagnosis not present

## 2021-10-20 DIAGNOSIS — Z79899 Other long term (current) drug therapy: Secondary | ICD-10-CM | POA: Insufficient documentation

## 2021-10-20 DIAGNOSIS — I5042 Chronic combined systolic (congestive) and diastolic (congestive) heart failure: Secondary | ICD-10-CM | POA: Diagnosis not present

## 2021-10-20 DIAGNOSIS — I480 Paroxysmal atrial fibrillation: Secondary | ICD-10-CM | POA: Diagnosis not present

## 2021-10-20 DIAGNOSIS — Z7901 Long term (current) use of anticoagulants: Secondary | ICD-10-CM | POA: Insufficient documentation

## 2021-10-20 DIAGNOSIS — D649 Anemia, unspecified: Secondary | ICD-10-CM

## 2021-10-20 DIAGNOSIS — F32A Depression, unspecified: Secondary | ICD-10-CM | POA: Insufficient documentation

## 2021-10-20 DIAGNOSIS — R69 Illness, unspecified: Secondary | ICD-10-CM | POA: Diagnosis not present

## 2021-10-20 DIAGNOSIS — Z7984 Long term (current) use of oral hypoglycemic drugs: Secondary | ICD-10-CM | POA: Diagnosis not present

## 2021-10-20 DIAGNOSIS — H547 Unspecified visual loss: Secondary | ICD-10-CM | POA: Insufficient documentation

## 2021-10-20 DIAGNOSIS — Z7902 Long term (current) use of antithrombotics/antiplatelets: Secondary | ICD-10-CM | POA: Insufficient documentation

## 2021-10-20 DIAGNOSIS — F419 Anxiety disorder, unspecified: Secondary | ICD-10-CM | POA: Diagnosis not present

## 2021-10-20 DIAGNOSIS — N179 Acute kidney failure, unspecified: Secondary | ICD-10-CM | POA: Insufficient documentation

## 2021-10-20 DIAGNOSIS — I471 Supraventricular tachycardia: Secondary | ICD-10-CM | POA: Insufficient documentation

## 2021-10-20 DIAGNOSIS — N1832 Chronic kidney disease, stage 3b: Secondary | ICD-10-CM | POA: Insufficient documentation

## 2021-10-20 DIAGNOSIS — I255 Ischemic cardiomyopathy: Secondary | ICD-10-CM | POA: Insufficient documentation

## 2021-10-20 DIAGNOSIS — I4891 Unspecified atrial fibrillation: Secondary | ICD-10-CM

## 2021-10-20 DIAGNOSIS — E11319 Type 2 diabetes mellitus with unspecified diabetic retinopathy without macular edema: Secondary | ICD-10-CM | POA: Diagnosis not present

## 2021-10-20 DIAGNOSIS — Z955 Presence of coronary angioplasty implant and graft: Secondary | ICD-10-CM | POA: Diagnosis not present

## 2021-10-20 DIAGNOSIS — R0789 Other chest pain: Secondary | ICD-10-CM | POA: Diagnosis not present

## 2021-10-20 DIAGNOSIS — F1220 Cannabis dependence, uncomplicated: Secondary | ICD-10-CM

## 2021-10-20 DIAGNOSIS — N183 Chronic kidney disease, stage 3 unspecified: Secondary | ICD-10-CM | POA: Diagnosis not present

## 2021-10-20 DIAGNOSIS — E1140 Type 2 diabetes mellitus with diabetic neuropathy, unspecified: Secondary | ICD-10-CM | POA: Insufficient documentation

## 2021-10-20 DIAGNOSIS — I130 Hypertensive heart and chronic kidney disease with heart failure and stage 1 through stage 4 chronic kidney disease, or unspecified chronic kidney disease: Secondary | ICD-10-CM | POA: Diagnosis not present

## 2021-10-20 DIAGNOSIS — I2510 Atherosclerotic heart disease of native coronary artery without angina pectoris: Secondary | ICD-10-CM | POA: Diagnosis not present

## 2021-10-20 DIAGNOSIS — I252 Old myocardial infarction: Secondary | ICD-10-CM | POA: Insufficient documentation

## 2021-10-20 LAB — BRAIN NATRIURETIC PEPTIDE: B Natriuretic Peptide: 771.4 pg/mL — ABNORMAL HIGH (ref 0.0–100.0)

## 2021-10-20 LAB — BASIC METABOLIC PANEL
Anion gap: 9 (ref 5–15)
BUN: 29 mg/dL — ABNORMAL HIGH (ref 6–20)
CO2: 27 mmol/L (ref 22–32)
Calcium: 8.5 mg/dL — ABNORMAL LOW (ref 8.9–10.3)
Chloride: 103 mmol/L (ref 98–111)
Creatinine, Ser: 1.81 mg/dL — ABNORMAL HIGH (ref 0.61–1.24)
GFR, Estimated: 43 mL/min — ABNORMAL LOW (ref >60)
Glucose, Bld: 151 mg/dL — ABNORMAL HIGH (ref 70–99)
Potassium: 4.7 mmol/L (ref 3.5–5.1)
Sodium: 139 mmol/L (ref 135–145)

## 2021-10-20 MED ORDER — FUROSEMIDE 20 MG PO TABS: 30.0000 mg | ORAL_TABLET | Freq: Every day | ORAL | 4 refills | Status: DC

## 2021-10-20 NOTE — Patient Instructions (Addendum)
Thank you for coming in today  Labs were done today, if any labs are abnormal the clinic will call you  [place]  Your physician recommends that you schedule a follow-up appointment in: [place]  Your physician recommends that you return for lab work in: 10 days  INCREASE Lasix to 30 mg 1 1/2 tablets daily   At the [facility], you and your health needs are our priority. As part of our continuing mission to provide you with exceptional heart care, we have created designated Provider Care Teams. These Care Teams include your primary Cardiologist (physician) and Advanced Practice Providers (APPs- Physician Assistants and Nurse Practitioners) who all work together to provide you with the care you need, when you need it.   You may see any of the following providers on your designated Care Team at your next follow up: Dr Glori Bickers Dr Haynes Kerns, NP Lyda Jester, [facility] [place], [place] Audry Riles, PharmD   Please be sure to bring in all your medications bottles to every appointment.   If you have any questions or concerns before your next appointment please send [place] a message through [place] or call our office at [phone].    TO LEAVE A MESSAGE FOR THE NURSE SELECT OPTION 2, PLEASE LEAVE A MESSAGE INCLUDING: YOUR NAME DATE OF BIRTH CALL BACK NUMBER REASON FOR CALL**this is important as we prioritize the call backs  YOU WILL RECEIVE A CALL BACK THE SAME DAY AS LONG AS YOU CALL BEFORE 4:00 PM

## 2021-10-20 NOTE — Progress Notes (Signed)
Medication Samples have been provided to the patient.  Drug name: Eliquis       Strength: 5mg         Qty: 14  LOT: [id number]  Exp.Date: 1/25  Dosing instructions: Take 1 tablet twice a day  The patient has been instructed regarding the correct time, dose, and frequency of taking this medication, including desired effects and most common side effects.   Tiney Rouge Jann Ra 11:43 AM 10/20/2021

## 2021-10-20 NOTE — Addendum Note (Signed)
Encounter addended by: Rockwell Alexandria, CMA on: 10/20/2021 11:48 AM  Actions taken: Clinical Note Signed

## 2021-10-20 NOTE — Progress Notes (Signed)
ReDS Vest / Clip - 10/20/21 1000       ReDS Vest / Clip   Station Marker D    Ruler Value 34    ReDS Value Range High volume overload    ReDS Actual Value 42

## 2021-10-21 ENCOUNTER — Other Ambulatory Visit: Payer: Self-pay | Admitting: *Deleted

## 2021-10-21 NOTE — Patient Outreach (Signed)
[facility]) Care Management  10/21/2021  Billy [name] [birth year] [id number]   [facility] Case closure -unable to maintain contact  Unsuccessful outreach on 09/23/21, 09/30/21, 10/07/21 Unsuccessful outreach letter sent 09/30/21 without a response   Plan [facility] RN CM will close case after no response from patient within 10 business days. Unable to reach Case closure letters sent to patient and MD  Joelene Millin L. Lavina Hamman, RN, BSN, [place] [facility] Coordinator Office number [phone] Mobile number [phone]  Main [facility] number [phone] Fax number [phone]

## 2021-10-23 ENCOUNTER — Telehealth (HOSPITAL_COMMUNITY): Payer: Self-pay

## 2021-10-23 NOTE — Telephone Encounter (Signed)
No response from pt.  Closed referral  

## 2021-11-03 ENCOUNTER — Other Ambulatory Visit: Payer: Self-pay

## 2021-11-03 ENCOUNTER — Ambulatory Visit (HOSPITAL_COMMUNITY)
Admission: RE | Admit: 2021-11-03 | Discharge: 2021-11-03 | Disposition: A | Discharge status: Home / Self Care | Payer: Medicare HMO | Source: Ambulatory Visit | Attending: Cardiology | Admitting: Cardiology

## 2021-11-03 DIAGNOSIS — I5042 Chronic combined systolic (congestive) and diastolic (congestive) heart failure: Secondary | ICD-10-CM | POA: Diagnosis not present

## 2021-11-03 LAB — BASIC METABOLIC PANEL
Anion gap: 6 (ref 5–15)
BUN: 27 mg/dL — ABNORMAL HIGH (ref 6–20)
CO2: 30 mmol/L (ref 22–32)
Calcium: 9.2 mg/dL (ref 8.9–10.3)
Chloride: 105 mmol/L (ref 98–111)
Creatinine, Ser: 1.91 mg/dL — ABNORMAL HIGH (ref 0.61–1.24)
GFR, Estimated: 41 mL/min — ABNORMAL LOW (ref >60)
Glucose, Bld: 109 mg/dL — ABNORMAL HIGH (ref 70–99)
Potassium: 4.7 mmol/L (ref 3.5–5.1)
Sodium: 141 mmol/L (ref 135–145)

## 2021-11-10 DIAGNOSIS — I4891 Unspecified atrial fibrillation: Secondary | ICD-10-CM | POA: Diagnosis not present

## 2021-11-10 DIAGNOSIS — R809 Proteinuria, unspecified: Secondary | ICD-10-CM | POA: Diagnosis not present

## 2021-11-10 DIAGNOSIS — E1122 Type 2 diabetes mellitus with diabetic chronic kidney disease: Secondary | ICD-10-CM | POA: Diagnosis not present

## 2021-11-10 DIAGNOSIS — N1832 Chronic kidney disease, stage 3b: Secondary | ICD-10-CM | POA: Diagnosis not present

## 2021-11-10 DIAGNOSIS — I129 Hypertensive chronic kidney disease with stage 1 through stage 4 chronic kidney disease, or unspecified chronic kidney disease: Secondary | ICD-10-CM | POA: Diagnosis not present

## 2021-11-10 DIAGNOSIS — M109 Gout, unspecified: Secondary | ICD-10-CM | POA: Diagnosis not present

## 2021-11-17 ENCOUNTER — Telehealth (HOSPITAL_COMMUNITY): Payer: Self-pay

## 2021-11-17 NOTE — Telephone Encounter (Signed)
Called patient to see if he was interested in participating in the Cardiac Rehab Program. Patient stated yes. Patient will come in for orientation on 12/09/21 @ 930AM and will attend the 1030AM exercise class. Went over insurance, patient verbalized understanding.  ?  ?Tourist information centre manager.  ?

## 2021-11-17 NOTE — Telephone Encounter (Signed)
**  Updated** ?  ?Pt insurance is active and benefits verified through [facility]. Co-pay $40.00, DED $0.00/$0.00 met, out of pocket $4,500.00/$75.00 met, co-insurance 0%. No pre-authorization required. Passport, 11/17/21 @ 3:23PM, FKC#[id number] ?

## 2021-11-21 DIAGNOSIS — E119 Type 2 diabetes mellitus without complications: Secondary | ICD-10-CM | POA: Diagnosis not present

## 2021-11-29 ENCOUNTER — Other Ambulatory Visit (HOSPITAL_COMMUNITY): Payer: Self-pay | Admitting: Family Medicine

## 2021-12-02 ENCOUNTER — Other Ambulatory Visit: Payer: Self-pay | Admitting: Surgical

## 2021-12-03 DIAGNOSIS — Z794 Long term (current) use of insulin: Secondary | ICD-10-CM | POA: Diagnosis not present

## 2021-12-03 DIAGNOSIS — I4891 Unspecified atrial fibrillation: Secondary | ICD-10-CM | POA: Diagnosis not present

## 2021-12-03 DIAGNOSIS — K760 Fatty (change of) liver, not elsewhere classified: Secondary | ICD-10-CM | POA: Diagnosis not present

## 2021-12-03 DIAGNOSIS — I129 Hypertensive chronic kidney disease with stage 1 through stage 4 chronic kidney disease, or unspecified chronic kidney disease: Secondary | ICD-10-CM | POA: Diagnosis not present

## 2021-12-03 DIAGNOSIS — E11311 Type 2 diabetes mellitus with unspecified diabetic retinopathy with macular edema: Secondary | ICD-10-CM | POA: Diagnosis not present

## 2021-12-03 DIAGNOSIS — E785 Hyperlipidemia, unspecified: Secondary | ICD-10-CM | POA: Diagnosis not present

## 2021-12-03 DIAGNOSIS — E669 Obesity, unspecified: Secondary | ICD-10-CM | POA: Diagnosis not present

## 2021-12-03 DIAGNOSIS — I700 Atherosclerosis of aorta: Secondary | ICD-10-CM | POA: Diagnosis not present

## 2021-12-03 DIAGNOSIS — R69 Illness, unspecified: Secondary | ICD-10-CM | POA: Diagnosis not present

## 2021-12-03 DIAGNOSIS — I255 Ischemic cardiomyopathy: Secondary | ICD-10-CM | POA: Diagnosis not present

## 2021-12-03 DIAGNOSIS — N1832 Chronic kidney disease, stage 3b: Secondary | ICD-10-CM | POA: Diagnosis not present

## 2021-12-03 DIAGNOSIS — I2510 Atherosclerotic heart disease of native coronary artery without angina pectoris: Secondary | ICD-10-CM | POA: Diagnosis not present

## 2021-12-04 ENCOUNTER — Telehealth (HOSPITAL_COMMUNITY): Payer: Self-pay | Admitting: *Deleted

## 2021-12-04 NOTE — Telephone Encounter (Addendum)
Spoke with Magnone will call back in 30 minutes as he is on the phone with his brother. Called regarding orientation appointment. Patient called back completed health history confirmed orientation appointment.  Harrell Gave RN BSN  ?

## 2021-12-09 ENCOUNTER — Encounter (HOSPITAL_COMMUNITY)
Admission: RE | Admit: 2021-12-09 | Discharge: 2021-12-09 | Disposition: A | Discharge status: Home / Self Care | Payer: Medicare HMO | Source: Ambulatory Visit | Attending: Cardiovascular Disease | Admitting: Cardiovascular Disease

## 2021-12-09 ENCOUNTER — Other Ambulatory Visit: Payer: Self-pay

## 2021-12-09 VITALS — BP 122/80 | HR 74 | Ht 72.75 in | Wt 257.1 lb

## 2021-12-09 DIAGNOSIS — Z951 Presence of aortocoronary bypass graft: Secondary | ICD-10-CM | POA: Insufficient documentation

## 2021-12-09 DIAGNOSIS — Z955 Presence of coronary angioplasty implant and graft: Secondary | ICD-10-CM | POA: Insufficient documentation

## 2021-12-09 DIAGNOSIS — I213 ST elevation (STEMI) myocardial infarction of unspecified site: Secondary | ICD-10-CM | POA: Insufficient documentation

## 2021-12-09 NOTE — Progress Notes (Signed)
Cardiac Rehab Medication Review by a Nurse ? ?Does the patient  feel that his/her medications are working for him/her?  YES  ? ?Has the patient been experiencing any side effects to the medications prescribed?   NO ? ?Does the patient measure his/her own blood pressure or blood glucose at home?  YES  ? ?Does the patient have any problems obtaining medications due to transportation or finances?    NO ? ?Understanding of regimen: good ?Understanding of indications: good ?Potential of compliance: good ? ? ? ?Nurse comments: Billy [name] is taking his medications as prescribed and has a good understanding of what his medications are for. Billy [name] says he is not taking Billy [name] at this time.  ? ? ? ?Harrell Gave RN ?12/09/2021 12:32 PM ?  ?

## 2021-12-09 NOTE — Progress Notes (Signed)
Cardiac Individual Treatment Plan ? ?Patient Details  ?Name: Billy [name] ?MRN: [id number] ?Date of Birth: 05-18-1966 ?Referring Provider:   ?Flowsheet Row CARDIAC REHAB PHASE II ORIENTATION from 12/09/2021 in [place]  ?Referring Provider Skeet Latch, MD  ? ?  ? ? ?Initial Encounter Date:  ?Flowsheet Row CARDIAC REHAB PHASE II ORIENTATION from 12/09/2021 in [place]  ?Date 12/09/21  ? ?  ? ? ?Visit Diagnosis: 07/07/21 STEMI ? ?07/07/21 PCI/DES Diagonal ? ?07/08/21 CABG x 4 ? ?Patient's Home Medications on Admission: ? ?Current Outpatient Medications:  ?  alprazolam (XANAX) 2 MG tablet, Take 1 tablet (2 mg total) by mouth 3 (three) times daily as needed for anxiety., Disp: 2 tablet, Rfl: 0 ?  apixaban (ELIQUIS) 5 MG TABS tablet, Take 1 tablet (5 mg total) by mouth 2 (two) times daily., Disp: 90 tablet, Rfl: 3 ?  atorvastatin (LIPITOR) 80 MG tablet, Take 1 tablet (80 mg total) by mouth daily., Disp: 90 tablet, Rfl: 3 ?  carvedilol (COREG) 6.25 MG tablet, Take 1 tablet (6.25 mg total) by mouth 2 (two) times daily with a meal., Disp: 180 tablet, Rfl: 3 ?  Cholecalciferol (VITAMIN D) 50 MCG (2000 UT) tablet, Take 2,000 Units by mouth daily., Disp: , Rfl:  ?  clopidogrel (PLAVIX) 75 MG tablet, Take 1 tablet (75 mg total) by mouth daily., Disp: 90 tablet, Rfl: 3 ?  dapagliflozin propanediol (FARXIGA) 10 MG TABS tablet, Take 1 tablet (10 mg total) by mouth daily before breakfast., Disp: 90 tablet, Rfl: 1 ?  diclofenac Sodium (VOLTAREN) 1 % GEL, Apply 1 application. topically 4 (four) times daily as needed for pain., Disp: , Rfl:  ?  furosemide (LASIX) 20 MG tablet, TAKE 2 TABLETS (40 MG TOTAL) BY MOUTH DAILY. (Patient taking differently: Take 30 mg by mouth daily.), Disp: 180 tablet, Rfl: 1 ?  hydrALAZINE (APRESOLINE) 25 MG tablet, Take 0.5 tablets (12.5 mg total) by mouth in the morning and at bedtime., Disp: 90 tablet, Rfl: 3 ?  MAGNESIUM CITRATE PO,  Take 1 tablet by mouth daily., Disp: , Rfl:  ?  meclizine (ANTIVERT) 12.5 MG tablet, Take 1 tablet (12.5 mg total) by mouth 2 (two) times daily as needed for dizziness., Disp: 30 tablet, Rfl: 0 ?  methocarbamol (ROBAXIN) 500 MG tablet, Take 500 mg by mouth 3 (three) times daily as needed for muscle spasms., Disp: , Rfl:  ?  Methylsulfonylmethane (MSM PO), Take 1 tablet by mouth daily., Disp: , Rfl:  ?  OVER THE COUNTER MEDICATION, Take 1 tablet by mouth daily as needed (energy). Super Beets supplement, Disp: , Rfl:  ?  OVER THE COUNTER MEDICATION, Take 1 Scoop by mouth daily. l'evate powder supplement, Disp: , Rfl:  ?  tamsulosin (FLOMAX) 0.4 MG CAPS capsule, Take 1 capsule (0.4 mg total) by mouth at bedtime. (Patient taking differently: Take 0.4 mg by mouth daily.), Disp: 30 capsule, Rfl: 1 ?  traZODone (DESYREL) 100 MG tablet, Take 100 mg by mouth at bedtime., Disp: , Rfl:  ?  zinc gluconate 50 MG tablet, Take 50 mg by mouth daily., Disp: , Rfl:  ?  Continuous Blood Gluc Sensor (FREESTYLE LIBRE 14 DAY SENSOR) MISC, Use to check CBG three times daily.  Change sensore every 14 days. DX:  E11.319, Disp: , Rfl:  ?  Insulin Pen Needle (BD PEN NEEDLE NANO 2ND GEN) 32G X 4 MM MISC, USE WITH TRESIBA DAILY E11.39, Disp: , Rfl:  ?  Lancets (FREESTYLE) lancets, CHECK BLOOD SUGAR 3 TIMES DAILY, Disp: , Rfl: 6 ?  oxyCODONE (OXY IR/ROXICODONE) 5 MG immediate release tablet, Take 1 tablet (5 mg total) by mouth every 6 (six) hours as needed for severe pain. (Patient not taking: Reported on 12/09/2021), Disp: 14 tablet, Rfl: 0 ?  TRESIBA FLEXTOUCH 100 UNIT/ML FlexTouch Pen, Inject 10-20 Units into the skin daily as needed (BS over 150). (Patient not taking: Reported on 12/09/2021), Disp: , Rfl:  ? ?Past Medical History: ?Past Medical History:  ?Diagnosis Date  ? Anxiety   ? Chronic combined systolic and diastolic heart failure ([place]) 09/22/2021  ? Depression   ? Diabetic neuropathy ([place])   ? Diabetic retinopathy ([place])   ? visual  impairment  ? Dysrhythmia   ? "palpatations sometimes" (06/30/2017)  ? Exertional dyspnea 06/30/2021  ? Family history of adverse reaction to anesthesia   ? sister had OR 2016; "couldn't wake up; could hear what they were saying but couldn't get their attention; like I was paralyzed but fully awake" (06/30/2017)  ? Pneumonia X 2  ? SVT (supraventricular tachycardia) ([place])   ? Toe ulcer ([place]) 06/30/2017  ? 2nd digit  ? Type II diabetes mellitus ([place])   ? ? ?Tobacco Use: ?Social History  ? ?Tobacco Use  ?Smoking Status Never  ?Smokeless Tobacco Never  ? ? ?Labs: ?Review Flowsheet   ? ?  ?  Latest Ref Rng & Units 07/19/2021 07/20/2021 07/21/2021 07/22/2021  ?Labs for ITP Cardiac and Pulmonary Rehab  ?O2 Saturation % 53.5   70.1   68.6   63.7    ? ?  07/23/2021  ?Labs for ITP Cardiac and Pulmonary Rehab  ?O2 Saturation 38.3    ? 52.1    ?  ? ? Multiple values from one day are sorted in reverse-chronological order  ?  ?  ? ? ?Capillary Blood Glucose: ?Lab Results  ?Component Value Date  ? GLUCAP 88 08/06/2021  ? GLUCAP 176 (H) 08/05/2021  ? GLUCAP 156 (H) 08/05/2021  ? GLUCAP 185 (H) 08/05/2021  ? GLUCAP 82 08/05/2021  ? ? ? ?Exercise Target Goals: ?Exercise Program Goal: ?Individual exercise prescription set using results from initial 6 min walk test and THRR while considering  patient?s activity barriers and safety.  ? ?Exercise Prescription Goal: ?Initial exercise prescription builds to 30-45 minutes a day of aerobic activity, 2-3 days per week.  Home exercise guidelines will be given to patient during program as part of exercise prescription that the participant will acknowledge. ? ?Activity Barriers & Risk Stratification: ? Activity Barriers & Cardiac Risk Stratification - 12/09/21 1004   ? ?  ? Activity Barriers & Cardiac Risk Stratification  ? Activity Barriers History of Falls;Other (comment)   ? Comments Visually impaired, peripheral neuropathy- both feet.   ? Cardiac Risk Stratification High   ? ?  ?  ? ?  ? ? ?6  Minute Walk: ? 6 Minute Walk   ? ? [place] Name 12/09/21 1001  ?  ?  ?  ? 6 Minute Walk  ? Phase Initial    ? Distance 1632 feet    ? Walk Time 6 minutes    ? # of Rest Breaks 0    ? MPH 3.09    ? METS 3.79    ? RPE 10    ? Perceived Dyspnea  0    ? VO2 Peak 13.57    ? Symptoms Yes (comment)    ? Comments Bilateral hip pain "3-4/10"  on pain scale.    ? Resting HR 74 bpm    ? Resting BP 122/80    ? Resting Oxygen Saturation  95 %    ? Exercise Oxygen Saturation  during 6 min walk 98 %    ? Max Ex. HR 84 bpm    ? Max Ex. BP 142/86    ? 2 Minute Post BP 148/90    ? ?  ?  ? ?  ? ? ?Oxygen Initial Assessment: ? ? ?Oxygen Re-Evaluation: ? ? ?Oxygen Discharge (Final Oxygen Re-Evaluation): ? ? ?Initial Exercise Prescription: ? Initial Exercise Prescription - 12/09/21 1100   ? ?  ? Date of Initial Exercise RX and Referring Provider  ? Date 12/09/21   ? Referring Provider Skeet Latch, MD   ? Expected Discharge Date 02/06/22   ?  ? Bike  ? Level 2   ? Minutes 15   ? METs 3.5   ?  ? NuStep  ? Level 3   ? SPM 85   ? Minutes 15   ? METs 3   ?  ? Prescription Details  ? Frequency (times per week) 3   ? Duration Progress to 30 minutes of continuous aerobic without signs/symptoms of physical distress   ?  ? Intensity  ? THRR 40-80% of Max Heartrate 66-132   ? Ratings of Perceived Exertion 11-13   ? Perceived Dyspnea 0-4   ?  ? Progression  ? Progression Continue to progress workloads to maintain intensity without signs/symptoms of physical distress.   ?  ? Resistance Training  ? Training Prescription Yes   ? Weight 4 lbs   ? Reps 10-15   ? ?  ?  ? ?  ? ? ?Perform Capillary Blood Glucose checks as needed. ? ?Exercise Prescription Changes: ? ? ?Exercise Comments: ? ? ?Exercise Goals and Review: ? ? Exercise Goals   ? ? [place] Name 12/09/21 0930  ?  ?  ?  ?  ?  ? Exercise Goals  ? Increase Physical Activity Yes      ? Intervention Provide advice, education, support and counseling about physical activity/exercise needs.;Develop an  individualized exercise prescription for aerobic and resistive training based on initial evaluation findings, risk stratification, comorbidities and participant's personal goals.      ? Expected Outcomes Short Ter

## 2021-12-11 DIAGNOSIS — E785 Hyperlipidemia, unspecified: Secondary | ICD-10-CM | POA: Diagnosis not present

## 2021-12-11 DIAGNOSIS — I10 Essential (primary) hypertension: Secondary | ICD-10-CM | POA: Diagnosis not present

## 2021-12-11 DIAGNOSIS — E113599 Type 2 diabetes mellitus with proliferative diabetic retinopathy without macular edema, unspecified eye: Secondary | ICD-10-CM | POA: Diagnosis not present

## 2021-12-15 ENCOUNTER — Encounter (HOSPITAL_COMMUNITY)
Admission: RE | Admit: 2021-12-15 | Discharge: 2021-12-15 | Disposition: A | Discharge status: Home / Self Care | Payer: Medicare HMO | Source: Ambulatory Visit | Attending: Cardiovascular Disease | Admitting: Cardiovascular Disease

## 2021-12-15 DIAGNOSIS — Z955 Presence of coronary angioplasty implant and graft: Secondary | ICD-10-CM | POA: Diagnosis not present

## 2021-12-15 DIAGNOSIS — I252 Old myocardial infarction: Secondary | ICD-10-CM | POA: Insufficient documentation

## 2021-12-15 DIAGNOSIS — Z48812 Encounter for surgical aftercare following surgery on the circulatory system: Secondary | ICD-10-CM | POA: Insufficient documentation

## 2021-12-15 DIAGNOSIS — I213 ST elevation (STEMI) myocardial infarction of unspecified site: Secondary | ICD-10-CM | POA: Diagnosis not present

## 2021-12-15 DIAGNOSIS — Z951 Presence of aortocoronary bypass graft: Secondary | ICD-10-CM | POA: Diagnosis not present

## 2021-12-15 LAB — GLUCOSE, CAPILLARY
Glucose-Capillary: 251 mg/dL — ABNORMAL HIGH (ref 70–99)
Glucose-Capillary: 268 mg/dL — ABNORMAL HIGH (ref 70–99)

## 2021-12-15 NOTE — Progress Notes (Signed)
Daily Session Note ? ?Patient Details  ?Name: Billy [name] ?MRN: [id number] ?Date of Birth: Jul 12, 1966 ?Referring Provider:   ?Flowsheet Row CARDIAC REHAB PHASE II ORIENTATION from 12/09/2021 in [place]  ?Referring Provider Billy Latch, MD  ? ?  ? ? ?Encounter Date: 12/15/2021 ? ?Check In: ? Session Check In - 12/15/21 1042   ? ?  ? Check-In  ? Supervising physician immediately available to respond to emergencies [facility] immediately available   ? Physician(s) Dr. Maryland Pink   ? Location [facility]   ? Staff Present Billy Pall, RN, Billy Pretty, MS, ACSM CEP, Exercise Physiologist;Billy Wilber Oliphant, RN, BSN;Billy [name] BS, ACSM EP-C, Exercise Physiologist;Billy [place], MS, ACSM-CEP, CCRP, Exercise Physiologist   ? Virtual Visit No   ? Medication changes reported     No   ? Fall or balance concerns reported    No   ? Tobacco Cessation No Change   ? Warm-up and Cool-down Performed as group-led instruction   ? Resistance Training Performed Yes   ? VAD Patient? No   ? PAD/SET Patient? No   ?  ? Pain Assessment  ? Currently in Pain? No/denies   ? Pain Score 0-No pain   ? Multiple Pain Sites No   ? ?  ?  ? ?  ? ? ?Capillary Blood Glucose: ?Results for orders placed or performed during the hospital encounter of 12/15/21 (from the past 24 hour(s))  ?Glucose, capillary     Status: Abnormal  ? Collection Time: 12/15/21 10:38 AM  ?Result Value Ref Range  ? Glucose-Capillary 268 (H) 70 - 99 mg/dL  ?Glucose, capillary     Status: Abnormal  ? Collection Time: 12/15/21 11:16 AM  ?Result Value Ref Range  ? Glucose-Capillary 251 (H) 70 - 99 mg/dL  ? ? ? ? ?Social History  ? ?Tobacco Use  ?Smoking Status Never  ?Smokeless Tobacco Never  ? ? ?Goals Met:  ?Exercise tolerated well ?No report of concerns or symptoms today ?Strength training completed today ? ?Goals Unmet:  ?Not Applicable ? ?Comments: Billy [name] started cardiac rehab today.  Pt tolerated light exercise  without difficulty. VSS, telemetry-Sinus Rhythm, asymptomatic.  Medication list reconciled. Pt denies barriers to medicaiton compliance.  PSYCHOSOCIAL ASSESSMENT:  PHQ-0. Pt exhibits positive coping skills, hopeful outlook with supportive family. No psychosocial needs identified at this time, no psychosocial interventions necessary.    Pt enjoys playing with his cat, reading scripture yard work and Research scientist (medical).   Pt oriented to exercise equipment and routine.Billy [name] says he will attend for one month and may consider participating tin the cardiac rehab maintenance program due to his high copays Understanding verbalized.Billy Pall, RN,BSN ?12/16/2021 12:42 PM  ? ? ?Billy [name] is Medical Director for Cardiac Rehab at [facility]. ?

## 2021-12-17 ENCOUNTER — Encounter (HOSPITAL_COMMUNITY): Payer: Medicare HMO

## 2021-12-19 ENCOUNTER — Encounter (HOSPITAL_COMMUNITY)
Admission: RE | Admit: 2021-12-19 | Discharge: 2021-12-19 | Disposition: A | Discharge status: Home / Self Care | Payer: Medicare HMO | Source: Ambulatory Visit | Attending: Cardiovascular Disease | Admitting: Cardiovascular Disease

## 2021-12-19 DIAGNOSIS — I213 ST elevation (STEMI) myocardial infarction of unspecified site: Secondary | ICD-10-CM

## 2021-12-19 DIAGNOSIS — Z951 Presence of aortocoronary bypass graft: Secondary | ICD-10-CM

## 2021-12-19 DIAGNOSIS — I252 Old myocardial infarction: Secondary | ICD-10-CM | POA: Diagnosis not present

## 2021-12-19 DIAGNOSIS — Z955 Presence of coronary angioplasty implant and graft: Secondary | ICD-10-CM

## 2021-12-19 DIAGNOSIS — Z48812 Encounter for surgical aftercare following surgery on the circulatory system: Secondary | ICD-10-CM | POA: Diagnosis not present

## 2021-12-22 ENCOUNTER — Encounter (HOSPITAL_COMMUNITY)
Admission: RE | Admit: 2021-12-22 | Discharge: 2021-12-22 | Disposition: A | Discharge status: Home / Self Care | Payer: Medicare HMO | Source: Ambulatory Visit | Attending: Cardiovascular Disease | Admitting: Cardiovascular Disease

## 2021-12-22 DIAGNOSIS — I213 ST elevation (STEMI) myocardial infarction of unspecified site: Secondary | ICD-10-CM

## 2021-12-22 DIAGNOSIS — Z48812 Encounter for surgical aftercare following surgery on the circulatory system: Secondary | ICD-10-CM | POA: Diagnosis not present

## 2021-12-22 DIAGNOSIS — Z951 Presence of aortocoronary bypass graft: Secondary | ICD-10-CM

## 2021-12-22 DIAGNOSIS — Z955 Presence of coronary angioplasty implant and graft: Secondary | ICD-10-CM | POA: Diagnosis not present

## 2021-12-22 DIAGNOSIS — I252 Old myocardial infarction: Secondary | ICD-10-CM | POA: Diagnosis not present

## 2021-12-23 NOTE — Progress Notes (Addendum)
Billy [name] 56 y.o. male ?Nutrition Note ?[name] is motivated to make lifestyle changes to aid with cardiac/pulmonary rehab. Patient has medical history of STEMI, CABGx4, HTN, Afib, chronic combined systolic diastolic heart failure, ischemic cardiomyopathy, acute respiratory failure, benign neoplasm of colon, fatty liver, DM2 with ulcer of toe and proliferative retinopathy, CKD3, marijuana dependence, primary gout. He is eager to make dietary changes. He lives alone and does very little cooking; he is interested in trying very simple things. He has implemented many dietary recommendations from the [place] including many premade vegan options such as Impossible burgers, [employer] veggie burgers, vegan cheese, etc. He also LOVES carrot juice. He is visually impaired which does create some barriers to cooking, reading food labels, etc.  ? ?Labs: patient reports most recent A1c of 6.8, GFR 41 ? ?Nutrition Diagnosis ?Food-and nutrition-related knowledge deficit related to lack of exposure to information as related to diagnosis of: ? CVD ? DM2 ?Excessive sodium intake related to over consumption of processed food as evidenced by frequent consumption of convenience food ?Excessive carbohydrate intake related to consumption of convenience foods and sugary beverages as evidenced by A1C 6.8 ? ?Nutrition Intervention ?Pt?s individual nutrition plan reviewed with pt. ?Benefits of adopting Heart Healthy diet discussed.  ?Continue client-centered nutrition education by RD, as part of interdisciplinary care. ? ?Monitor/Evaluation: ?Patient reports motivation to make lifestyle changes for adherence to heart healthy diet recommendation, blood sugar control, and weight management. We discussed fiber intake from fruits and vegetables, complex carbohydrates/whole grains, and sodium intake. Encouraged patient to use the plate method as a guide for meal planning. Patient had many questions today. Handouts/notes given.  Patient amicable to RD suggestions and verbalizes understanding. Will continue to follow-up with patient.  ? ?15 minutes spent in review of topics related to a heart healthy diet including sodium intake, blood sugar control, weight management, and fiber intake. ? ?Goal(s) ?Pt to identify and limit food sources of saturated fat, trans fat, refined carbohydrates and sodium ?Pt able to name foods that affect blood glucose. Continue to limit simple sugars, refined carbohydrates, sugary beverages, etc.  ?Pt to describe the benefit of including lean protein/plant proteins, fruits, vegetables, whole grains, nuts/seeds, and low-fat dairy products in a heart healthy meal plan. ?Pt to use the plate method as a guide for meal planning to support carbohydrate consistency, portion control, etc. ? ?Plan:  ?Will provide client-centered nutrition education as part of interdisciplinary care ?Monitor and evaluate progress toward nutrition goal with team. ? ? ?Aldona [place], MS, RDN, LDN ? ?

## 2021-12-23 NOTE — Progress Notes (Signed)
Cardiac Individual Treatment Plan ? ?Patient Details  ?Name: Billy [name] ?MRN: [id number] ?Date of Birth: 1966-09-04 ?Referring Provider:   ?Flowsheet Row CARDIAC REHAB PHASE II ORIENTATION from 12/09/2021 in [place]  ?Referring Provider Skeet Latch, MD  ? ?  ? ? ?Initial Encounter Date:  ?Flowsheet Row CARDIAC REHAB PHASE II ORIENTATION from 12/09/2021 in [place]  ?Date 12/09/21  ? ?  ? ? ?Visit Diagnosis: 07/07/21 STEMI ? ?07/07/21 PCI/DES Diagonal ? ?07/08/21 CABG x 4 ? ?Patient's Home Medications on Admission: ? ?Current Outpatient Medications:  ?  alprazolam (XANAX) 2 MG tablet, Take 1 tablet (2 mg total) by mouth 3 (three) times daily as needed for anxiety., Disp: 2 tablet, Rfl: 0 ?  apixaban (ELIQUIS) 5 MG TABS tablet, Take 1 tablet (5 mg total) by mouth 2 (two) times daily., Disp: 90 tablet, Rfl: 3 ?  atorvastatin (LIPITOR) 80 MG tablet, Take 1 tablet (80 mg total) by mouth daily., Disp: 90 tablet, Rfl: 3 ?  carvedilol (COREG) 6.25 MG tablet, Take 1 tablet (6.25 mg total) by mouth 2 (two) times daily with a meal., Disp: 180 tablet, Rfl: 3 ?  Cholecalciferol (VITAMIN D) 50 MCG (2000 UT) tablet, Take 2,000 Units by mouth daily., Disp: , Rfl:  ?  clopidogrel (PLAVIX) 75 MG tablet, Take 1 tablet (75 mg total) by mouth daily., Disp: 90 tablet, Rfl: 3 ?  Continuous Blood Gluc Sensor (FREESTYLE LIBRE 14 DAY SENSOR) MISC, Use to check CBG three times daily.  Change sensore every 14 days. DX:  E11.319, Disp: , Rfl:  ?  dapagliflozin propanediol (FARXIGA) 10 MG TABS tablet, Take 1 tablet (10 mg total) by mouth daily before breakfast., Disp: 90 tablet, Rfl: 1 ?  diclofenac Sodium (VOLTAREN) 1 % GEL, Apply 1 application. topically 4 (four) times daily as needed for pain., Disp: , Rfl:  ?  furosemide (LASIX) 20 MG tablet, TAKE 2 TABLETS (40 MG TOTAL) BY MOUTH DAILY. (Patient taking differently: Take 30 mg by mouth daily.), Disp: 180 tablet, Rfl:  1 ?  hydrALAZINE (APRESOLINE) 25 MG tablet, Take 0.5 tablets (12.5 mg total) by mouth in the morning and at bedtime., Disp: 90 tablet, Rfl: 3 ?  Insulin Pen Needle (BD PEN NEEDLE NANO 2ND GEN) 32G X 4 MM MISC, USE WITH TRESIBA DAILY E11.39, Disp: , Rfl:  ?  Lancets (FREESTYLE) lancets, CHECK BLOOD SUGAR 3 TIMES DAILY, Disp: , Rfl: 6 ?  MAGNESIUM CITRATE PO, Take 1 tablet by mouth daily., Disp: , Rfl:  ?  meclizine (ANTIVERT) 12.5 MG tablet, Take 1 tablet (12.5 mg total) by mouth 2 (two) times daily as needed for dizziness., Disp: 30 tablet, Rfl: 0 ?  methocarbamol (ROBAXIN) 500 MG tablet, Take 500 mg by mouth 3 (three) times daily as needed for muscle spasms., Disp: , Rfl:  ?  Methylsulfonylmethane (MSM PO), Take 1 tablet by mouth daily., Disp: , Rfl:  ?  OVER THE COUNTER MEDICATION, Take 1 tablet by mouth daily as needed (energy). Super Beets supplement, Disp: , Rfl:  ?  OVER THE COUNTER MEDICATION, Take 1 Scoop by mouth daily. l'evate powder supplement, Disp: , Rfl:  ?  oxyCODONE (OXY IR/ROXICODONE) 5 MG immediate release tablet, Take 1 tablet (5 mg total) by mouth every 6 (six) hours as needed for severe pain. (Patient not taking: Reported on 12/09/2021), Disp: 14 tablet, Rfl: 0 ?  tamsulosin (FLOMAX) 0.4 MG CAPS capsule, Take 1 capsule (0.4 mg total) by  mouth at bedtime. (Patient taking differently: Take 0.4 mg by mouth daily.), Disp: 30 capsule, Rfl: 1 ?  traZODone (DESYREL) 100 MG tablet, Take 100 mg by mouth at bedtime., Disp: , Rfl:  ?  TRESIBA FLEXTOUCH 100 UNIT/ML FlexTouch Pen, Inject 10-20 Units into the skin daily as needed (BS over 150). (Patient not taking: Reported on 12/09/2021), Disp: , Rfl:  ?  zinc gluconate 50 MG tablet, Take 50 mg by mouth daily., Disp: , Rfl:  ? ?Past Medical History: ?Past Medical History:  ?Diagnosis Date  ? Anxiety   ? Chronic combined systolic and diastolic heart failure ([place]) 09/22/2021  ? Depression   ? Diabetic neuropathy ([place])   ? Diabetic retinopathy ([place])   ? visual  impairment  ? Dysrhythmia   ? "palpatations sometimes" (06/30/2017)  ? Exertional dyspnea 06/30/2021  ? Family history of adverse reaction to anesthesia   ? sister had OR 2016; "couldn't wake up; could hear what they were saying but couldn't get their attention; like I was paralyzed but fully awake" (06/30/2017)  ? Pneumonia X 2  ? SVT (supraventricular tachycardia) ([place])   ? Toe ulcer ([place]) 06/30/2017  ? 2nd digit  ? Type II diabetes mellitus ([place])   ? ? ?Tobacco Use: ?Social History  ? ?Tobacco Use  ?Smoking Status Never  ?Smokeless Tobacco Never  ? ? ?Labs: ?Review Flowsheet   ? ?  ?  Latest Ref Rng & Units 07/19/2021 07/20/2021 07/21/2021 07/22/2021  ?Labs for ITP Cardiac and Pulmonary Rehab  ?O2 Saturation % 53.5   70.1   68.6   63.7    ? ?  07/23/2021  ?Labs for ITP Cardiac and Pulmonary Rehab  ?O2 Saturation 38.3    ? 52.1    ?  ? ? Multiple values from one day are sorted in reverse-chronological order  ?  ?  ? ? ?Capillary Blood Glucose: ?Lab Results  ?Component Value Date  ? GLUCAP 251 (H) 12/15/2021  ? GLUCAP 268 (H) 12/15/2021  ? GLUCAP 88 08/06/2021  ? GLUCAP 176 (H) 08/05/2021  ? GLUCAP 156 (H) 08/05/2021  ? ? ? ?Exercise Target Goals: ?Exercise Program Goal: ?Individual exercise prescription set using results from initial 6 min walk test and THRR while considering  patient?s activity barriers and safety.  ? ?Exercise Prescription Goal: ?Initial exercise prescription builds to 30-45 minutes a day of aerobic activity, 2-3 days per week.  Home exercise guidelines will be given to patient during program as part of exercise prescription that the participant will acknowledge. ? ?Activity Barriers & Risk Stratification: ? Activity Barriers & Cardiac Risk Stratification - 12/09/21 1004   ? ?  ? Activity Barriers & Cardiac Risk Stratification  ? Activity Barriers History of Falls;Other (comment)   ? Comments Visually impaired, peripheral neuropathy- both feet.   ? Cardiac Risk Stratification High   ? ?  ?  ? ?   ? ? ?6 Minute Walk: ? 6 Minute Walk   ? ? [place] Name 12/09/21 1001  ?  ?  ?  ? 6 Minute Walk  ? Phase Initial    ? Distance 1632 feet    ? Walk Time 6 minutes    ? # of Rest Breaks 0    ? MPH 3.09    ? METS 3.79    ? RPE 10    ? Perceived Dyspnea  0    ? VO2 Peak 13.57    ? Symptoms Yes (comment)    ? Comments Bilateral hip  pain "3-4/10" on pain scale.    ? Resting HR 74 bpm    ? Resting BP 122/80    ? Resting Oxygen Saturation  95 %    ? Exercise Oxygen Saturation  during 6 min walk 98 %    ? Max Ex. HR 84 bpm    ? Max Ex. BP 142/86    ? 2 Minute Post BP 148/90    ? ?  ?  ? ?  ? ? ?Oxygen Initial Assessment: ? ? ?Oxygen Re-Evaluation: ? ? ?Oxygen Discharge (Final Oxygen Re-Evaluation): ? ? ?Initial Exercise Prescription: ? Initial Exercise Prescription - 12/09/21 1100   ? ?  ? Date of Initial Exercise RX and Referring Provider  ? Date 12/09/21   ? Referring Provider Skeet Latch, MD   ? Expected Discharge Date 02/06/22   ?  ? Bike  ? Level 2   ? Minutes 15   ? METs 3.5   ?  ? NuStep  ? Level 3   ? SPM 85   ? Minutes 15   ? METs 3   ?  ? Prescription Details  ? Frequency (times per week) 3   ? Duration Progress to 30 minutes of continuous aerobic without signs/symptoms of physical distress   ?  ? Intensity  ? THRR 40-80% of Max Heartrate 66-132   ? Ratings of Perceived Exertion 11-13   ? Perceived Dyspnea 0-4   ?  ? Progression  ? Progression Continue to progress workloads to maintain intensity without signs/symptoms of physical distress.   ?  ? Resistance Training  ? Training Prescription Yes   ? Weight 4 lbs   ? Reps 10-15   ? ?  ?  ? ?  ? ? ?Perform Capillary Blood Glucose checks as needed. ? ?Exercise Prescription Changes: ? ? Exercise Prescription Changes   ? ? [place] Name 12/15/21 1044 12/22/21 1029  ?  ?  ?  ?  ? Response to Exercise  ? Blood Pressure (Admit) 128/70 122/70     ? Blood Pressure (Exercise) 142/80 130/70     ? Blood Pressure (Exit) 142/80 120/82     ? Heart Rate (Admit) 79 bpm 72 bpm     ?  Heart Rate (Exercise) 82 bpm 95 bpm     ? Heart Rate (Exit) 72 bpm 72 bpm     ? Rating of Perceived Exertion (Exercise) 11 11     ? Symptoms None None     ? Comments Off to a good start with exercise. Increased WL

## 2021-12-24 ENCOUNTER — Encounter (HOSPITAL_COMMUNITY)
Admission: RE | Admit: 2021-12-24 | Discharge: 2021-12-24 | Disposition: A | Discharge status: Home / Self Care | Payer: Medicare HMO | Source: Ambulatory Visit | Attending: Cardiovascular Disease | Admitting: Cardiovascular Disease

## 2021-12-24 DIAGNOSIS — Z951 Presence of aortocoronary bypass graft: Secondary | ICD-10-CM

## 2021-12-24 DIAGNOSIS — Z48812 Encounter for surgical aftercare following surgery on the circulatory system: Secondary | ICD-10-CM | POA: Diagnosis not present

## 2021-12-24 DIAGNOSIS — I213 ST elevation (STEMI) myocardial infarction of unspecified site: Secondary | ICD-10-CM | POA: Diagnosis not present

## 2021-12-24 DIAGNOSIS — Z955 Presence of coronary angioplasty implant and graft: Secondary | ICD-10-CM | POA: Diagnosis not present

## 2021-12-24 DIAGNOSIS — I252 Old myocardial infarction: Secondary | ICD-10-CM | POA: Diagnosis not present

## 2021-12-26 ENCOUNTER — Encounter (HOSPITAL_COMMUNITY)
Admission: RE | Admit: 2021-12-26 | Discharge: 2021-12-26 | Disposition: A | Discharge status: Home / Self Care | Payer: Medicare HMO | Source: Ambulatory Visit | Attending: Cardiovascular Disease | Admitting: Cardiovascular Disease

## 2021-12-26 DIAGNOSIS — I213 ST elevation (STEMI) myocardial infarction of unspecified site: Secondary | ICD-10-CM | POA: Diagnosis not present

## 2021-12-26 DIAGNOSIS — I252 Old myocardial infarction: Secondary | ICD-10-CM | POA: Diagnosis not present

## 2021-12-26 DIAGNOSIS — Z955 Presence of coronary angioplasty implant and graft: Secondary | ICD-10-CM | POA: Diagnosis not present

## 2021-12-26 DIAGNOSIS — Z951 Presence of aortocoronary bypass graft: Secondary | ICD-10-CM

## 2021-12-26 DIAGNOSIS — Z48812 Encounter for surgical aftercare following surgery on the circulatory system: Secondary | ICD-10-CM | POA: Diagnosis not present

## 2021-12-29 ENCOUNTER — Encounter (HOSPITAL_COMMUNITY)
Admission: RE | Admit: 2021-12-29 | Discharge: 2021-12-29 | Disposition: A | Discharge status: Home / Self Care | Payer: Medicare HMO | Source: Ambulatory Visit | Attending: Cardiovascular Disease | Admitting: Cardiovascular Disease

## 2021-12-29 DIAGNOSIS — Z48812 Encounter for surgical aftercare following surgery on the circulatory system: Secondary | ICD-10-CM | POA: Diagnosis not present

## 2021-12-29 DIAGNOSIS — Z951 Presence of aortocoronary bypass graft: Secondary | ICD-10-CM | POA: Diagnosis not present

## 2021-12-29 DIAGNOSIS — I213 ST elevation (STEMI) myocardial infarction of unspecified site: Secondary | ICD-10-CM | POA: Diagnosis not present

## 2021-12-29 DIAGNOSIS — Z955 Presence of coronary angioplasty implant and graft: Secondary | ICD-10-CM | POA: Diagnosis not present

## 2021-12-29 DIAGNOSIS — I252 Old myocardial infarction: Secondary | ICD-10-CM | POA: Diagnosis not present

## 2021-12-31 ENCOUNTER — Encounter (HOSPITAL_COMMUNITY)
Admission: RE | Admit: 2021-12-31 | Discharge: 2021-12-31 | Disposition: A | Discharge status: Home / Self Care | Payer: Medicare HMO | Source: Ambulatory Visit | Attending: Cardiovascular Disease | Admitting: Cardiovascular Disease

## 2021-12-31 DIAGNOSIS — I213 ST elevation (STEMI) myocardial infarction of unspecified site: Secondary | ICD-10-CM | POA: Diagnosis not present

## 2021-12-31 DIAGNOSIS — Z955 Presence of coronary angioplasty implant and graft: Secondary | ICD-10-CM | POA: Diagnosis not present

## 2021-12-31 DIAGNOSIS — Z951 Presence of aortocoronary bypass graft: Secondary | ICD-10-CM | POA: Diagnosis not present

## 2021-12-31 DIAGNOSIS — Z48812 Encounter for surgical aftercare following surgery on the circulatory system: Secondary | ICD-10-CM | POA: Diagnosis not present

## 2021-12-31 DIAGNOSIS — I252 Old myocardial infarction: Secondary | ICD-10-CM | POA: Diagnosis not present

## 2021-12-31 LAB — GLUCOSE, CAPILLARY: Glucose-Capillary: 173 mg/dL — ABNORMAL HIGH (ref 70–99)

## 2022-01-02 ENCOUNTER — Encounter (HOSPITAL_COMMUNITY): Payer: Medicare HMO

## 2022-01-05 ENCOUNTER — Encounter (HOSPITAL_COMMUNITY)
Admission: RE | Admit: 2022-01-05 | Discharge: 2022-01-05 | Disposition: A | Discharge status: Home / Self Care | Payer: Medicare HMO | Source: Ambulatory Visit | Attending: Cardiovascular Disease | Admitting: Cardiovascular Disease

## 2022-01-05 ENCOUNTER — Other Ambulatory Visit: Payer: Self-pay

## 2022-01-05 DIAGNOSIS — I252 Old myocardial infarction: Secondary | ICD-10-CM | POA: Diagnosis not present

## 2022-01-05 DIAGNOSIS — Z955 Presence of coronary angioplasty implant and graft: Secondary | ICD-10-CM | POA: Diagnosis not present

## 2022-01-05 DIAGNOSIS — I213 ST elevation (STEMI) myocardial infarction of unspecified site: Secondary | ICD-10-CM | POA: Diagnosis not present

## 2022-01-05 DIAGNOSIS — Z951 Presence of aortocoronary bypass graft: Secondary | ICD-10-CM

## 2022-01-05 DIAGNOSIS — Z48812 Encounter for surgical aftercare following surgery on the circulatory system: Secondary | ICD-10-CM | POA: Diagnosis not present

## 2022-01-05 NOTE — Progress Notes (Signed)
Patient reported that he experiences a internal clicking at the distal end of his incision at times when changing positions. Billy [name] reports that this has gone on for the past few months. Billy [name] reports that he has experienced this clicking a few times during exercise at cardiac rehab. Mid sternal incision is well healed as Billy [name]'s surgery was in October of last year. Dr Lightfoot's office called and notified about the patient's complaint. Left message with RN to call patient.Billy [name] has been doing well with exercise at cardiac rehab.Barnet Pall, RN,BSN ?01/05/2022 11:30 AM  ?

## 2022-01-05 NOTE — Progress Notes (Signed)
Reviewed home exercise guidelines with patient including endpoints, temperature precautions, target heart rate and rate of perceived exertion. Patient is currently walking as his mode of home exercise. Patient plans to increase walking to 30 minutes (2 times around the block, ~2 miles and 200 feet) at least 2 days/week in addition to exercise at cardiac rehab with a goal of achieving 150 minutes of aerobic exercise/week. Patient would like to resume riding his mountain bike once cleared by his cardiologist. Patient has a pulse oximeter to monitor his pulse. Patient voices understanding of instructions given. ? ?Sol Passer, MS, ACSM CEP ? ? ?

## 2022-01-05 NOTE — Progress Notes (Addendum)
Billy [name] with [facility] contacted the office stating that during patient's rehab session today patient states that for the past two months patient has been experiencing a popping sound in the lower part of his sternum. He is s/p CABG with Dr. [name] 07/08/21. He states that it has happened during certain movements but seems to be doing it more often now. Advised that he needed a follow- up appointment with Dr. [name] for evaluation and chest xray. Patient acknowledged receipt and appointment made for 5/12. ?

## 2022-01-06 DIAGNOSIS — N1832 Chronic kidney disease, stage 3b: Secondary | ICD-10-CM | POA: Diagnosis not present

## 2022-01-06 DIAGNOSIS — I4891 Unspecified atrial fibrillation: Secondary | ICD-10-CM | POA: Diagnosis not present

## 2022-01-06 DIAGNOSIS — R809 Proteinuria, unspecified: Secondary | ICD-10-CM | POA: Diagnosis not present

## 2022-01-06 DIAGNOSIS — M109 Gout, unspecified: Secondary | ICD-10-CM | POA: Diagnosis not present

## 2022-01-06 DIAGNOSIS — I129 Hypertensive chronic kidney disease with stage 1 through stage 4 chronic kidney disease, or unspecified chronic kidney disease: Secondary | ICD-10-CM | POA: Diagnosis not present

## 2022-01-06 DIAGNOSIS — E1122 Type 2 diabetes mellitus with diabetic chronic kidney disease: Secondary | ICD-10-CM | POA: Diagnosis not present

## 2022-01-07 ENCOUNTER — Encounter (HOSPITAL_COMMUNITY)
Admission: RE | Admit: 2022-01-07 | Discharge: 2022-01-07 | Disposition: A | Discharge status: Home / Self Care | Payer: Medicare HMO | Source: Ambulatory Visit | Attending: Cardiovascular Disease | Admitting: Cardiovascular Disease

## 2022-01-07 DIAGNOSIS — I213 ST elevation (STEMI) myocardial infarction of unspecified site: Secondary | ICD-10-CM

## 2022-01-07 DIAGNOSIS — Z955 Presence of coronary angioplasty implant and graft: Secondary | ICD-10-CM

## 2022-01-07 DIAGNOSIS — I252 Old myocardial infarction: Secondary | ICD-10-CM | POA: Diagnosis not present

## 2022-01-07 DIAGNOSIS — Z48812 Encounter for surgical aftercare following surgery on the circulatory system: Secondary | ICD-10-CM | POA: Diagnosis not present

## 2022-01-07 DIAGNOSIS — Z951 Presence of aortocoronary bypass graft: Secondary | ICD-10-CM | POA: Diagnosis not present

## 2022-01-09 ENCOUNTER — Encounter (HOSPITAL_COMMUNITY)
Admission: RE | Admit: 2022-01-09 | Discharge: 2022-01-09 | Disposition: A | Discharge status: Home / Self Care | Payer: Medicare HMO | Source: Ambulatory Visit | Attending: Cardiovascular Disease | Admitting: Cardiovascular Disease

## 2022-01-09 DIAGNOSIS — I252 Old myocardial infarction: Secondary | ICD-10-CM | POA: Diagnosis not present

## 2022-01-09 DIAGNOSIS — I213 ST elevation (STEMI) myocardial infarction of unspecified site: Secondary | ICD-10-CM | POA: Diagnosis not present

## 2022-01-09 DIAGNOSIS — Z955 Presence of coronary angioplasty implant and graft: Secondary | ICD-10-CM | POA: Diagnosis not present

## 2022-01-09 DIAGNOSIS — Z48812 Encounter for surgical aftercare following surgery on the circulatory system: Secondary | ICD-10-CM | POA: Diagnosis not present

## 2022-01-09 DIAGNOSIS — Z951 Presence of aortocoronary bypass graft: Secondary | ICD-10-CM | POA: Diagnosis not present

## 2022-01-12 ENCOUNTER — Encounter (HOSPITAL_COMMUNITY)
Admission: RE | Admit: 2022-01-12 | Discharge: 2022-01-12 | Disposition: A | Discharge status: Home / Self Care | Payer: Medicare Other | Source: Ambulatory Visit | Attending: Cardiovascular Disease | Admitting: Cardiovascular Disease

## 2022-01-12 DIAGNOSIS — I252 Old myocardial infarction: Secondary | ICD-10-CM | POA: Insufficient documentation

## 2022-01-12 DIAGNOSIS — Z955 Presence of coronary angioplasty implant and graft: Secondary | ICD-10-CM | POA: Insufficient documentation

## 2022-01-12 DIAGNOSIS — Z951 Presence of aortocoronary bypass graft: Secondary | ICD-10-CM | POA: Insufficient documentation

## 2022-01-12 DIAGNOSIS — Z48812 Encounter for surgical aftercare following surgery on the circulatory system: Secondary | ICD-10-CM | POA: Diagnosis not present

## 2022-01-12 DIAGNOSIS — I213 ST elevation (STEMI) myocardial infarction of unspecified site: Secondary | ICD-10-CM

## 2022-01-12 DIAGNOSIS — E119 Type 2 diabetes mellitus without complications: Secondary | ICD-10-CM | POA: Diagnosis not present

## 2022-01-12 LAB — GLUCOSE, CAPILLARY: Glucose-Capillary: 136 mg/dL — ABNORMAL HIGH (ref 70–99)

## 2022-01-14 ENCOUNTER — Encounter (HOSPITAL_COMMUNITY)
Admission: RE | Admit: 2022-01-14 | Discharge: 2022-01-14 | Disposition: A | Discharge status: Home / Self Care | Payer: Medicare Other | Source: Ambulatory Visit | Attending: Cardiovascular Disease | Admitting: Cardiovascular Disease

## 2022-01-14 DIAGNOSIS — I213 ST elevation (STEMI) myocardial infarction of unspecified site: Secondary | ICD-10-CM

## 2022-01-14 DIAGNOSIS — Z955 Presence of coronary angioplasty implant and graft: Secondary | ICD-10-CM | POA: Diagnosis not present

## 2022-01-14 DIAGNOSIS — Z951 Presence of aortocoronary bypass graft: Secondary | ICD-10-CM

## 2022-01-16 ENCOUNTER — Telehealth (HOSPITAL_COMMUNITY): Payer: Self-pay | Admitting: *Deleted

## 2022-01-16 ENCOUNTER — Encounter (HOSPITAL_COMMUNITY)
Admission: RE | Admit: 2022-01-16 | Discharge: 2022-01-16 | Disposition: A | Discharge status: Home / Self Care | Payer: Medicare Other | Source: Ambulatory Visit | Attending: Cardiovascular Disease | Admitting: Cardiovascular Disease

## 2022-01-16 VITALS — BP 124/80 | HR 80 | Wt 246.3 lb

## 2022-01-16 DIAGNOSIS — Z955 Presence of coronary angioplasty implant and graft: Secondary | ICD-10-CM

## 2022-01-16 DIAGNOSIS — I213 ST elevation (STEMI) myocardial infarction of unspecified site: Secondary | ICD-10-CM

## 2022-01-16 DIAGNOSIS — Z951 Presence of aortocoronary bypass graft: Secondary | ICD-10-CM

## 2022-01-16 NOTE — Telephone Encounter (Signed)
Received call from Billy [name] regarding setting up transportation through his Billy [name].  Pt was able to schedule his ride for 5/8 and subsequent scheduled exercise sessions. Pt has upcoming appt on 5/9 for echo depending upon results he may cut his rehab participation and continue to exercise on his own.  Billy [name] has 24 one way rides he can use for medical appts for the year and he would like to conserve his rides for future appts and he feels confident in his exercise. Called to cancel his upcoming rides with [employer]. Cherre Huger, BSN ?Cardiac and Pulmonary Rehab Nurse Navigator  ? ?

## 2022-01-18 ENCOUNTER — Emergency Department (HOSPITAL_COMMUNITY): Payer: Medicare Other

## 2022-01-18 ENCOUNTER — Encounter (HOSPITAL_COMMUNITY)
Admission: EM | Disposition: A | Discharge status: Home / Self Care | Payer: Self-pay | Source: Home / Self Care | Attending: Internal Medicine

## 2022-01-18 ENCOUNTER — Inpatient Hospital Stay (HOSPITAL_COMMUNITY)
Admission: EM | Admit: 2022-01-18 | Discharge: 2022-01-20 | DRG: 202 | Disposition: A | Discharge status: Home / Self Care | Payer: Medicare Other | Attending: Internal Medicine | Admitting: Internal Medicine

## 2022-01-18 ENCOUNTER — Other Ambulatory Visit: Payer: Self-pay

## 2022-01-18 ENCOUNTER — Encounter (HOSPITAL_COMMUNITY): Payer: Self-pay

## 2022-01-18 DIAGNOSIS — J9601 Acute respiratory failure with hypoxia: Secondary | ICD-10-CM | POA: Diagnosis present

## 2022-01-18 DIAGNOSIS — J204 Acute bronchitis due to parainfluenza virus: Principal | ICD-10-CM | POA: Diagnosis present

## 2022-01-18 DIAGNOSIS — I5042 Chronic combined systolic (congestive) and diastolic (congestive) heart failure: Secondary | ICD-10-CM | POA: Diagnosis present

## 2022-01-18 DIAGNOSIS — I4891 Unspecified atrial fibrillation: Secondary | ICD-10-CM | POA: Diagnosis present

## 2022-01-18 DIAGNOSIS — N179 Acute kidney failure, unspecified: Secondary | ICD-10-CM | POA: Diagnosis present

## 2022-01-18 DIAGNOSIS — J208 Acute bronchitis due to other specified organisms: Secondary | ICD-10-CM | POA: Diagnosis not present

## 2022-01-18 DIAGNOSIS — E113599 Type 2 diabetes mellitus with proliferative diabetic retinopathy without macular edema, unspecified eye: Secondary | ICD-10-CM | POA: Diagnosis present

## 2022-01-18 DIAGNOSIS — I252 Old myocardial infarction: Secondary | ICD-10-CM

## 2022-01-18 DIAGNOSIS — Z7901 Long term (current) use of anticoagulants: Secondary | ICD-10-CM

## 2022-01-18 DIAGNOSIS — I10 Essential (primary) hypertension: Secondary | ICD-10-CM | POA: Diagnosis not present

## 2022-01-18 DIAGNOSIS — I480 Paroxysmal atrial fibrillation: Secondary | ICD-10-CM

## 2022-01-18 DIAGNOSIS — I130 Hypertensive heart and chronic kidney disease with heart failure and stage 1 through stage 4 chronic kidney disease, or unspecified chronic kidney disease: Secondary | ICD-10-CM | POA: Diagnosis present

## 2022-01-18 DIAGNOSIS — R531 Weakness: Secondary | ICD-10-CM | POA: Diagnosis not present

## 2022-01-18 DIAGNOSIS — F419 Anxiety disorder, unspecified: Secondary | ICD-10-CM | POA: Diagnosis present

## 2022-01-18 DIAGNOSIS — Z6831 Body mass index (BMI) 31.0-31.9, adult: Secondary | ICD-10-CM

## 2022-01-18 DIAGNOSIS — I2510 Atherosclerotic heart disease of native coronary artery without angina pectoris: Secondary | ICD-10-CM | POA: Diagnosis present

## 2022-01-18 DIAGNOSIS — I959 Hypotension, unspecified: Secondary | ICD-10-CM | POA: Diagnosis present

## 2022-01-18 DIAGNOSIS — Z79899 Other long term (current) drug therapy: Secondary | ICD-10-CM

## 2022-01-18 DIAGNOSIS — I255 Ischemic cardiomyopathy: Secondary | ICD-10-CM | POA: Diagnosis present

## 2022-01-18 DIAGNOSIS — Z833 Family history of diabetes mellitus: Secondary | ICD-10-CM

## 2022-01-18 DIAGNOSIS — J189 Pneumonia, unspecified organism: Secondary | ICD-10-CM | POA: Diagnosis present

## 2022-01-18 DIAGNOSIS — Z89421 Acquired absence of other right toe(s): Secondary | ICD-10-CM

## 2022-01-18 DIAGNOSIS — E785 Hyperlipidemia, unspecified: Secondary | ICD-10-CM | POA: Diagnosis present

## 2022-01-18 DIAGNOSIS — E669 Obesity, unspecified: Secondary | ICD-10-CM | POA: Diagnosis present

## 2022-01-18 DIAGNOSIS — Z794 Long term (current) use of insulin: Secondary | ICD-10-CM

## 2022-01-18 DIAGNOSIS — J40 Bronchitis, not specified as acute or chronic: Secondary | ICD-10-CM | POA: Diagnosis not present

## 2022-01-18 DIAGNOSIS — Z20822 Contact with and (suspected) exposure to covid-19: Secondary | ICD-10-CM | POA: Diagnosis present

## 2022-01-18 DIAGNOSIS — Z955 Presence of coronary angioplasty implant and graft: Secondary | ICD-10-CM

## 2022-01-18 DIAGNOSIS — N1832 Chronic kidney disease, stage 3b: Secondary | ICD-10-CM | POA: Diagnosis present

## 2022-01-18 DIAGNOSIS — Z7902 Long term (current) use of antithrombotics/antiplatelets: Secondary | ICD-10-CM

## 2022-01-18 DIAGNOSIS — Z888 Allergy status to other drugs, medicaments and biological substances status: Secondary | ICD-10-CM

## 2022-01-18 DIAGNOSIS — E1122 Type 2 diabetes mellitus with diabetic chronic kidney disease: Secondary | ICD-10-CM | POA: Diagnosis present

## 2022-01-18 DIAGNOSIS — I4819 Other persistent atrial fibrillation: Secondary | ICD-10-CM | POA: Diagnosis present

## 2022-01-18 DIAGNOSIS — Z8249 Family history of ischemic heart disease and other diseases of the circulatory system: Secondary | ICD-10-CM

## 2022-01-18 DIAGNOSIS — E861 Hypovolemia: Secondary | ICD-10-CM | POA: Diagnosis present

## 2022-01-18 DIAGNOSIS — Z951 Presence of aortocoronary bypass graft: Secondary | ICD-10-CM

## 2022-01-18 DIAGNOSIS — E1140 Type 2 diabetes mellitus with diabetic neuropathy, unspecified: Secondary | ICD-10-CM | POA: Diagnosis present

## 2022-01-18 DIAGNOSIS — D696 Thrombocytopenia, unspecified: Secondary | ICD-10-CM | POA: Diagnosis present

## 2022-01-18 DIAGNOSIS — R051 Acute cough: Secondary | ICD-10-CM

## 2022-01-18 LAB — CBC WITH DIFFERENTIAL/PLATELET
Abs Immature Granulocytes: 0.01 10*3/uL (ref 0.00–0.07)
Basophils Absolute: 0 10*3/uL (ref 0.0–0.1)
Basophils Relative: 0 %
Eosinophils Absolute: 0 10*3/uL (ref 0.0–0.5)
Eosinophils Relative: 1 %
HCT: 38.8 % — ABNORMAL LOW (ref 39.0–52.0)
Hemoglobin: 13 g/dL (ref 13.0–17.0)
Immature Granulocytes: 0 %
Lymphocytes Relative: 28 %
Lymphs Abs: 1.4 10*3/uL (ref 0.7–4.0)
MCH: 32.2 pg (ref 26.0–34.0)
MCHC: 33.5 g/dL (ref 30.0–36.0)
MCV: 96 fL (ref 80.0–100.0)
Monocytes Absolute: 0.8 10*3/uL (ref 0.1–1.0)
Monocytes Relative: 16 %
Neutro Abs: 2.7 10*3/uL (ref 1.7–7.7)
Neutrophils Relative %: 55 %
Platelets: 138 10*3/uL — ABNORMAL LOW (ref 150–400)
RBC: 4.04 MIL/uL — ABNORMAL LOW (ref 4.22–5.81)
RDW: 13.1 % (ref 11.5–15.5)
WBC: 4.9 10*3/uL (ref 4.0–10.5)
nRBC: 0 % (ref 0.0–0.2)

## 2022-01-18 LAB — URINALYSIS, ROUTINE W REFLEX MICROSCOPIC
Bacteria, UA: NONE SEEN
Bilirubin Urine: NEGATIVE
Glucose, UA: >=500 mg/dL — AB
Hgb urine dipstick: NEGATIVE
Ketones, ur: 5 mg/dL — AB
Leukocytes,Ua: NEGATIVE
Nitrite: NEGATIVE
Protein, ur: 100 mg/dL — AB
Specific Gravity, Urine: 1.014 (ref 1.005–1.030)
pH: 5 (ref 5.0–8.0)

## 2022-01-18 LAB — COMPREHENSIVE METABOLIC PANEL
ALT: 11 U/L (ref 0–44)
AST: 24 U/L (ref 15–41)
Albumin: 3.2 g/dL — ABNORMAL LOW (ref 3.5–5.0)
Alkaline Phosphatase: 36 U/L — ABNORMAL LOW (ref 38–126)
Anion gap: 8 (ref 5–15)
BUN: 31 mg/dL — ABNORMAL HIGH (ref 6–20)
CO2: 24 mmol/L (ref 22–32)
Calcium: 8.2 mg/dL — ABNORMAL LOW (ref 8.9–10.3)
Chloride: 104 mmol/L (ref 98–111)
Creatinine, Ser: 3.01 mg/dL — ABNORMAL HIGH (ref 0.61–1.24)
GFR, Estimated: 24 mL/min — ABNORMAL LOW (ref >60)
Glucose, Bld: 155 mg/dL — ABNORMAL HIGH (ref 70–99)
Potassium: 4.8 mmol/L (ref 3.5–5.1)
Sodium: 136 mmol/L (ref 135–145)
Total Bilirubin: 1.1 mg/dL (ref 0.3–1.2)
Total Protein: 6 g/dL — ABNORMAL LOW (ref 6.5–8.1)

## 2022-01-18 LAB — RESP PANEL BY RT-PCR (FLU A&B, COVID) ARPGX2
Influenza A by PCR: NEGATIVE
Influenza B by PCR: NEGATIVE
SARS Coronavirus 2 by RT PCR: NEGATIVE

## 2022-01-18 LAB — LIPASE, BLOOD: Lipase: 32 U/L (ref 11–51)

## 2022-01-18 LAB — LACTIC ACID, PLASMA: Lactic Acid, Venous: 1.4 mmol/L (ref 0.5–1.9)

## 2022-01-18 LAB — TROPONIN I (HIGH SENSITIVITY): Troponin I (High Sensitivity): 34 ng/L — ABNORMAL HIGH (ref <18)

## 2022-01-18 SURGERY — CORONARY/GRAFT ACUTE MI REVASCULARIZATION
Anesthesia: LOCAL

## 2022-01-18 MED ORDER — SODIUM CHLORIDE 0.9 % IV SOLN
1.0000 g | Freq: Once | INTRAVENOUS | Status: AC
  Administered 2022-01-18: 1 g via INTRAVENOUS
  Filled 2022-01-18: qty 10

## 2022-01-18 MED ORDER — ALBUTEROL SULFATE HFA 108 (90 BASE) MCG/ACT IN AERS: 1.0000 | INHALATION_SPRAY | RESPIRATORY_TRACT | Status: DC | PRN

## 2022-01-18 MED ORDER — CARVEDILOL 6.25 MG PO TABS
6.2500 mg | ORAL_TABLET | Freq: Two times a day (BID) | ORAL | Status: DC
  Administered 2022-01-19 – 2022-01-20 (×3): 6.25 mg via ORAL
  Filled 2022-01-18: qty 1
  Filled 2022-01-18: qty 2
  Filled 2022-01-18: qty 1

## 2022-01-18 MED ORDER — LACTATED RINGERS IV BOLUS
1000.0000 mL | Freq: Once | INTRAVENOUS | Status: AC
  Administered 2022-01-18: 1000 mL via INTRAVENOUS

## 2022-01-18 MED ORDER — ALPRAZOLAM 0.5 MG PO TABS
2.0000 mg | ORAL_TABLET | Freq: Three times a day (TID) | ORAL | Status: DC | PRN
  Administered 2022-01-19 – 2022-01-20 (×4): 2 mg via ORAL
  Filled 2022-01-18 (×2): qty 8
  Filled 2022-01-18 (×2): qty 4

## 2022-01-18 MED ORDER — LACTATED RINGERS IV BOLUS
500.0000 mL | Freq: Once | INTRAVENOUS | Status: AC
  Administered 2022-01-18: 500 mL via INTRAVENOUS

## 2022-01-18 MED ORDER — METHOCARBAMOL 500 MG PO TABS: 500.0000 mg | ORAL_TABLET | Freq: Three times a day (TID) | ORAL | Status: DC | PRN

## 2022-01-18 MED ORDER — BENZONATATE 100 MG PO CAPS
100.0000 mg | ORAL_CAPSULE | Freq: Once | ORAL | Status: AC
  Administered 2022-01-18: 100 mg via ORAL
  Filled 2022-01-18: qty 1

## 2022-01-18 MED ORDER — VITAMIN D 25 MCG (1000 UNIT) PO TABS
2000.0000 [IU] | ORAL_TABLET | Freq: Every day | ORAL | Status: DC
  Administered 2022-01-19 – 2022-01-20 (×2): 2000 [IU] via ORAL
  Filled 2022-01-18 (×2): qty 2

## 2022-01-18 MED ORDER — TRAZODONE HCL 50 MG PO TABS
100.0000 mg | ORAL_TABLET | Freq: Every day | ORAL | Status: DC
  Administered 2022-01-19 (×2): 100 mg via ORAL
  Filled 2022-01-18 (×2): qty 2

## 2022-01-18 MED ORDER — INSULIN ASPART 100 UNIT/ML IJ SOLN
0.0000 [IU] | Freq: Three times a day (TID) | INTRAMUSCULAR | Status: DC
  Administered 2022-01-19: 2 [IU] via SUBCUTANEOUS
  Administered 2022-01-20: 1 [IU] via SUBCUTANEOUS
  Administered 2022-01-20: 2 [IU] via SUBCUTANEOUS

## 2022-01-18 MED ORDER — SODIUM CHLORIDE 0.9 % IV SOLN
500.0000 mg | Freq: Once | INTRAVENOUS | Status: AC
  Administered 2022-01-18: 500 mg via INTRAVENOUS
  Filled 2022-01-18: qty 5

## 2022-01-18 MED ORDER — APIXABAN 5 MG PO TABS
5.0000 mg | ORAL_TABLET | Freq: Two times a day (BID) | ORAL | Status: DC
  Administered 2022-01-19 – 2022-01-20 (×4): 5 mg via ORAL
  Filled 2022-01-18 (×4): qty 1

## 2022-01-18 MED ORDER — CLOPIDOGREL BISULFATE 75 MG PO TABS
75.0000 mg | ORAL_TABLET | Freq: Every day | ORAL | Status: DC
  Administered 2022-01-19 – 2022-01-20 (×2): 75 mg via ORAL
  Filled 2022-01-18 (×2): qty 1

## 2022-01-18 MED ORDER — ZINC SULFATE 220 (50 ZN) MG PO CAPS
220.0000 mg | ORAL_CAPSULE | Freq: Every day | ORAL | Status: DC
  Administered 2022-01-19 – 2022-01-20 (×2): 220 mg via ORAL
  Filled 2022-01-18 (×3): qty 1

## 2022-01-18 MED ORDER — TAMSULOSIN HCL 0.4 MG PO CAPS
0.4000 mg | ORAL_CAPSULE | Freq: Every day | ORAL | Status: DC
  Administered 2022-01-19: 0.4 mg via ORAL
  Filled 2022-01-18 (×2): qty 1

## 2022-01-18 NOTE — Assessment & Plan Note (Signed)
-   Continue Eliquis 
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
## 2022-01-18 NOTE — Assessment & Plan Note (Signed)
Continue Coreg. ?- Hold Lasix due to hypovolemia and AKI ?

## 2022-01-18 NOTE — Assessment & Plan Note (Signed)
Remains normotensive here however reportedly was hypotensive with EMS and he does have AKI and hypovolemia.  Hold hydralazine overnight. ?

## 2022-01-18 NOTE — ED Notes (Signed)
Patient transported to CT 

## 2022-01-18 NOTE — ED Provider Notes (Signed)
?[facility] ?Provider Note ? ? ?CSN: [id number] ?Arrival date & time: 01/18/22  1805 ? ?  ? ?History ? ?Chief Complaint  ?Patient presents with  ? Chest Pain  ? Cough  ? Nasal Congestion  ? Nausea  ? ? ?Billy [name] is a 56 y.o. male. ? ?Patient presents via EMS as a possible code STEMI.  He has a history of CAD status post CABG, CHF, diabetes, Eliquis use presenting with a 5-day history of generalized weakness, fatigue, cough, congestion, nausea and feeling generally weak with some chest heaviness since last night.  States he had difficulty breathing with cough and congestion and nausea for about 4 to 5 days.  He was found to be diaphoretic by EMS and they activated code STEMI in the field.  On arrival EKG is similar to previous and he is having no active chest pain.  Code STEMI was canceled.  Patient feels generally weak and lightheaded especially when he tries to stand up.  He does have 1 episode of vomiting today and otherwise has had poor p.o. intake.  Denies diarrhea.  Denies abdominal pain.  Denies any pain with urination or blood in the urine.  No travel or sick contacts ? ?The history is provided by the patient and the EMS personnel. The history is limited by the condition of the patient.  ?Chest Pain ?Associated symptoms: cough, dizziness, fatigue, nausea, vomiting and weakness   ?Associated symptoms: no abdominal pain and no fever   ?Cough ?Associated symptoms: chest pain and myalgias   ?Associated symptoms: no fever, no rash and no rhinorrhea   ? ?  ? ?Home Medications ?Prior to Admission medications   ?Medication Sig Start Date End Date Taking? Authorizing Provider  ?alprazolam (XANAX) 2 MG tablet Take 1 tablet (2 mg total) by mouth 3 (three) times daily as needed for anxiety. 07/06/17   Nita Sells, MD  ?apixaban (ELIQUIS) 5 MG TABS tablet Take 1 tablet (5 mg total) by mouth 2 (two) times daily. 08/12/21   Loel Dubonnet, NP  ?atorvastatin (LIPITOR) 80  MG tablet Take 1 tablet (80 mg total) by mouth daily. 08/12/21   Swinyer, Lanice Schwab, NP  ?carvedilol (COREG) 6.25 MG tablet Take 1 tablet (6.25 mg total) by mouth 2 (two) times daily with a meal. 08/12/21   Swinyer, Lanice Schwab, NP  ?Cholecalciferol (VITAMIN D) 50 MCG (2000 UT) tablet Take 2,000 Units by mouth daily.    [provider]  ?clopidogrel (PLAVIX) 75 MG tablet Take 1 tablet (75 mg total) by mouth daily. 08/12/21   Swinyer, Lanice Schwab, NP  ?Continuous Blood Gluc Sensor (FREESTYLE LIBRE 14 DAY SENSOR) MISC Use to check CBG three times daily.  Change sensore every 14 days. DX:  S96.283 08/09/17   [provider]  ?dapagliflozin propanediol (FARXIGA) 10 MG TABS tablet Take 1 tablet (10 mg total) by mouth daily before breakfast. 09/22/21   Skeet Latch, MD  ?diclofenac Sodium (VOLTAREN) 1 % GEL Apply 1 application. topically 4 (four) times daily as needed for pain. 10/16/21   [provider]  ?furosemide (LASIX) 20 MG tablet TAKE 2 TABLETS (40 MG TOTAL) BY MOUTH DAILY. ?Patient taking differently: Take 30 mg by mouth daily. 12/01/21   Rafael Bihari, FNP  ?hydrALAZINE (APRESOLINE) 25 MG tablet Take 0.5 tablets (12.5 mg total) by mouth in the morning and at bedtime. 09/10/21   Skeet Latch, MD  ?Insulin Pen Needle (BD PEN NEEDLE NANO 2ND GEN) 32G X  4 MM MISC USE WITH TRESIBA DAILY E11.39    [provider]  ?Lancets (FREESTYLE) lancets CHECK BLOOD SUGAR 3 TIMES DAILY 10/03/15   [provider]  ?MAGNESIUM CITRATE PO Take 1 tablet by mouth daily.    [provider]  ?meclizine (ANTIVERT) 12.5 MG tablet Take 1 tablet (12.5 mg total) by mouth 2 (two) times daily as needed for dizziness. 08/06/21   Stefen Giovanni, PA-C  ?methocarbamol (ROBAXIN) 500 MG tablet Take 500 mg by mouth 3 (three) times daily as needed for muscle spasms. 12/03/21   [provider]  ?Methylsulfonylmethane (MSM PO) Take 1 tablet by mouth daily.    [provider]   ?OVER THE COUNTER MEDICATION Take 1 tablet by mouth daily as needed (energy). Super Beets supplement    [provider]  ?OVER THE COUNTER MEDICATION Take 1 Scoop by mouth daily. l'evate powder supplement    [provider]  ?oxyCODONE (OXY IR/ROXICODONE) 5 MG immediate release tablet Take 1 tablet (5 mg total) by mouth every 6 (six) hours as needed for severe pain. ?Patient not taking: Reported on 12/09/2021 09/24/21   Nani Skillern, PA-C  ?tamsulosin (FLOMAX) 0.4 MG CAPS capsule Take 1 capsule (0.4 mg total) by mouth at bedtime. ?Patient taking differently: Take 0.4 mg by mouth daily. 08/06/21   Brysen Giovanni, PA-C  ?traZODone (DESYREL) 100 MG tablet Take 100 mg by mouth at bedtime. 06/23/21   [provider]  ?TRESIBA FLEXTOUCH 100 UNIT/ML FlexTouch Pen Inject 10-20 Units into the skin daily as needed (BS over 150). ?Patient not taking: Reported on 12/09/2021 06/02/21   [provider]  ?zinc gluconate 50 MG tablet Take 50 mg by mouth daily.    [provider]  ?   ? ?Allergies    ?Metformin hcl   ? ?Review of Systems   ?Review of Systems  ?Constitutional:  Positive for activity change, appetite change and fatigue. Negative for fever.  ?HENT:  Negative for congestion and rhinorrhea.   ?Respiratory:  Positive for cough. Negative for chest tightness.   ?Cardiovascular:  Positive for chest pain.  ?Gastrointestinal:  Positive for nausea and vomiting. Negative for abdominal pain.  ?Genitourinary:  Negative for dysuria and hematuria.  ?Musculoskeletal:  Positive for arthralgias and myalgias.  ?Skin:  Negative for rash.  ?Neurological:  Positive for dizziness, weakness and light-headedness.  ? all other systems are negative except as noted in the HPI and PMH.  ? ?Physical Exam ?Updated Vital Signs ?BP 111/73   Pulse 72   Temp 99.4 ?F (37.4 ?C) (Rectal)   Resp 12   Ht '6\' 2"'$  (1.88 m)   Wt 109.8 kg   SpO2 98%   BMI 31.07 kg/m?  ?Physical Exam ?Vitals and nursing note  reviewed.  ?Constitutional:   ?   General: He is not in acute distress. ?   Appearance: He is well-developed. He is ill-appearing.  ?   Comments: Ill-appearing, diaphoretic  ?HENT:  ?   Head: Normocephalic and atraumatic.  ?   Mouth/Throat:  ?   Pharynx: No oropharyngeal exudate.  ?Eyes:  ?   Conjunctiva/sclera: Conjunctivae normal.  ?   Pupils: Pupils are equal, round, and reactive to light.  ?Neck:  ?   Comments: No meningismus. ?Cardiovascular:  ?   Rate and Rhythm: Normal rate and regular rhythm.  ?   Heart sounds: Normal heart sounds. No murmur heard. ?Pulmonary:  ?   Effort: Pulmonary effort is normal. No respiratory distress.  ?  Breath sounds: Normal breath sounds.  ?Abdominal:  ?   Palpations: Abdomen is soft.  ?   Tenderness: There is no abdominal tenderness. There is no guarding or rebound.  ?Musculoskeletal:     ?   General: No tenderness. Normal range of motion.  ?   Cervical back: Normal range of motion and neck supple.  ?Skin: ?   General: Skin is warm.  ?Neurological:  ?   Mental Status: He is alert and oriented to person, place, and time.  ?   Cranial Nerves: No cranial nerve deficit.  ?   Motor: No abnormal muscle tone.  ?   Coordination: Coordination normal.  ?   Comments:  5/5 strength throughout. CN 2-12 intact.Equal grip strength.   ?Psychiatric:     ?   Behavior: Behavior normal.  ? ? ?ED Results / Procedures / Treatments   ?Labs ?(all labs ordered are listed, but only abnormal results are displayed) ?Labs Reviewed  ?CBC WITH DIFFERENTIAL/PLATELET - Abnormal; Notable for the following components:  ?    Result Value  ? RBC 4.04 (*)   ? HCT 38.8 (*)   ? Platelets 138 (*)   ? All other components within normal limits  ?COMPREHENSIVE METABOLIC PANEL - Abnormal; Notable for the following components:  ? Glucose, Bld 155 (*)   ? BUN 31 (*)   ? Creatinine, Ser 3.01 (*)   ? Calcium 8.2 (*)   ? Total Protein 6.0 (*)   ? Albumin 3.2 (*)   ? Alkaline Phosphatase 36 (*)   ? GFR, Estimated 24 (*)   ? All  other components within normal limits  ?URINALYSIS, ROUTINE W REFLEX MICROSCOPIC - Abnormal; Notable for the following components:  ? Glucose, UA >=500 (*)   ? Ketones, ur 5 (*)   ? Protein, ur 100 (*)   ? Al

## 2022-01-18 NOTE — Assessment & Plan Note (Addendum)
-  place on sensitive SSI 

## 2022-01-18 NOTE — ED Triage Notes (Signed)
PT arrives via ems from home as an activated stemi in the field. Cardiologist at bedside, reports currently not going to cath lab at this moment (field ekg is similar to previous). Pt c/o generalized weakness since Wednesday and chest heaviness since last night. Endorses cough, nausea and chest congestion. Pt arrives alert and oriented x4. Pt is diaphoretic. Reports a couple of near syncopal events today.  ?

## 2022-01-18 NOTE — Assessment & Plan Note (Signed)
Initially brought in the ED as a code STEMI for concerns of subtle inferiorly ST elevation.  Has been evaluated by cardiology and EKG is similar to prior. ?Chest pain likely due to infection. ?Continue Plavix ?

## 2022-01-18 NOTE — Assessment & Plan Note (Signed)
Creatinine elevated at 3.01 from prior of 1.91. ?-Has received 1.5 L of LR fluid in the ED.  Follow with repeat BMP tomorrow. ?- Avoid nephrotoxic agent ?- Renally dose medications ?

## 2022-01-18 NOTE — Assessment & Plan Note (Signed)
Presented with 3 days of congestion and cough. Negative flu/COVID PCR.  ?-suspect would see pneumonia on imaging after that period of time but negative CXR and CT chest. . Has received 1x dose of Azithromycin and Rocephin. Check procalcitonin and RVP.  ?-PRN albuterol  ?
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
## 2022-01-18 NOTE — Assessment & Plan Note (Signed)
Continue statin. 

## 2022-01-19 ENCOUNTER — Observation Stay (HOSPITAL_COMMUNITY): Payer: Medicare Other

## 2022-01-19 ENCOUNTER — Encounter (HOSPITAL_COMMUNITY): Payer: Medicare Other

## 2022-01-19 ENCOUNTER — Inpatient Hospital Stay (HOSPITAL_COMMUNITY): Payer: Medicare Other

## 2022-01-19 DIAGNOSIS — Z888 Allergy status to other drugs, medicaments and biological substances status: Secondary | ICD-10-CM | POA: Diagnosis not present

## 2022-01-19 DIAGNOSIS — I252 Old myocardial infarction: Secondary | ICD-10-CM | POA: Diagnosis not present

## 2022-01-19 DIAGNOSIS — E1140 Type 2 diabetes mellitus with diabetic neuropathy, unspecified: Secondary | ICD-10-CM | POA: Diagnosis present

## 2022-01-19 DIAGNOSIS — R531 Weakness: Secondary | ICD-10-CM | POA: Diagnosis present

## 2022-01-19 DIAGNOSIS — I130 Hypertensive heart and chronic kidney disease with heart failure and stage 1 through stage 4 chronic kidney disease, or unspecified chronic kidney disease: Secondary | ICD-10-CM | POA: Diagnosis present

## 2022-01-19 DIAGNOSIS — D696 Thrombocytopenia, unspecified: Secondary | ICD-10-CM | POA: Diagnosis present

## 2022-01-19 DIAGNOSIS — E1122 Type 2 diabetes mellitus with diabetic chronic kidney disease: Secondary | ICD-10-CM | POA: Diagnosis present

## 2022-01-19 DIAGNOSIS — I2510 Atherosclerotic heart disease of native coronary artery without angina pectoris: Secondary | ICD-10-CM | POA: Diagnosis present

## 2022-01-19 DIAGNOSIS — J9601 Acute respiratory failure with hypoxia: Secondary | ICD-10-CM | POA: Diagnosis present

## 2022-01-19 DIAGNOSIS — I10 Essential (primary) hypertension: Secondary | ICD-10-CM | POA: Diagnosis not present

## 2022-01-19 DIAGNOSIS — Z951 Presence of aortocoronary bypass graft: Secondary | ICD-10-CM | POA: Diagnosis not present

## 2022-01-19 DIAGNOSIS — R079 Chest pain, unspecified: Secondary | ICD-10-CM | POA: Diagnosis not present

## 2022-01-19 DIAGNOSIS — N179 Acute kidney failure, unspecified: Secondary | ICD-10-CM | POA: Diagnosis present

## 2022-01-19 DIAGNOSIS — J204 Acute bronchitis due to parainfluenza virus: Secondary | ICD-10-CM | POA: Diagnosis present

## 2022-01-19 DIAGNOSIS — Z833 Family history of diabetes mellitus: Secondary | ICD-10-CM | POA: Diagnosis not present

## 2022-01-19 DIAGNOSIS — J189 Pneumonia, unspecified organism: Secondary | ICD-10-CM | POA: Diagnosis present

## 2022-01-19 DIAGNOSIS — J208 Acute bronchitis due to other specified organisms: Secondary | ICD-10-CM | POA: Diagnosis not present

## 2022-01-19 DIAGNOSIS — E785 Hyperlipidemia, unspecified: Secondary | ICD-10-CM | POA: Diagnosis present

## 2022-01-19 DIAGNOSIS — E113599 Type 2 diabetes mellitus with proliferative diabetic retinopathy without macular edema, unspecified eye: Secondary | ICD-10-CM | POA: Diagnosis present

## 2022-01-19 DIAGNOSIS — E669 Obesity, unspecified: Secondary | ICD-10-CM | POA: Diagnosis present

## 2022-01-19 DIAGNOSIS — Z89421 Acquired absence of other right toe(s): Secondary | ICD-10-CM | POA: Diagnosis not present

## 2022-01-19 DIAGNOSIS — N1832 Chronic kidney disease, stage 3b: Secondary | ICD-10-CM | POA: Diagnosis present

## 2022-01-19 DIAGNOSIS — Z20822 Contact with and (suspected) exposure to covid-19: Secondary | ICD-10-CM | POA: Diagnosis present

## 2022-01-19 DIAGNOSIS — I4819 Other persistent atrial fibrillation: Secondary | ICD-10-CM | POA: Diagnosis present

## 2022-01-19 DIAGNOSIS — Z955 Presence of coronary angioplasty implant and graft: Secondary | ICD-10-CM | POA: Diagnosis not present

## 2022-01-19 DIAGNOSIS — I255 Ischemic cardiomyopathy: Secondary | ICD-10-CM | POA: Diagnosis present

## 2022-01-19 DIAGNOSIS — Z8249 Family history of ischemic heart disease and other diseases of the circulatory system: Secondary | ICD-10-CM | POA: Diagnosis not present

## 2022-01-19 DIAGNOSIS — I5042 Chronic combined systolic (congestive) and diastolic (congestive) heart failure: Secondary | ICD-10-CM | POA: Diagnosis present

## 2022-01-19 LAB — ECHOCARDIOGRAM COMPLETE
AR max vel: 2.75 cm2
AV Peak grad: 7.6 mmHg
Ao pk vel: 1.38 m/s
Area-P 1/2: 3.42 cm2
Height: 74 in
S' Lateral: 3.3 cm
Weight: 3872 oz

## 2022-01-19 LAB — BASIC METABOLIC PANEL
Anion gap: 8 (ref 5–15)
BUN: 31 mg/dL — ABNORMAL HIGH (ref 6–20)
CO2: 27 mmol/L (ref 22–32)
Calcium: 8.7 mg/dL — ABNORMAL LOW (ref 8.9–10.3)
Chloride: 105 mmol/L (ref 98–111)
Creatinine, Ser: 2.79 mg/dL — ABNORMAL HIGH (ref 0.61–1.24)
GFR, Estimated: 26 mL/min — ABNORMAL LOW (ref >60)
Glucose, Bld: 135 mg/dL — ABNORMAL HIGH (ref 70–99)
Potassium: 4.8 mmol/L (ref 3.5–5.1)
Sodium: 140 mmol/L (ref 135–145)

## 2022-01-19 LAB — RESPIRATORY PANEL BY PCR

## 2022-01-19 LAB — GLUCOSE, CAPILLARY
Glucose-Capillary: 182 mg/dL — ABNORMAL HIGH (ref 70–99)
Glucose-Capillary: 205 mg/dL — ABNORMAL HIGH (ref 70–99)

## 2022-01-19 LAB — HEMOGLOBIN A1C
Hgb A1c MFr Bld: 6.9 % — ABNORMAL HIGH (ref 4.8–5.6)
Mean Plasma Glucose: 151.33 mg/dL

## 2022-01-19 LAB — CBC
HCT: 42.4 % (ref 39.0–52.0)
Hemoglobin: 14.2 g/dL (ref 13.0–17.0)
MCH: 32 pg (ref 26.0–34.0)
MCHC: 33.5 g/dL (ref 30.0–36.0)
MCV: 95.5 fL (ref 80.0–100.0)
Platelets: 127 10*3/uL — ABNORMAL LOW (ref 150–400)
RBC: 4.44 MIL/uL (ref 4.22–5.81)
RDW: 13.2 % (ref 11.5–15.5)
WBC: 5.1 10*3/uL (ref 4.0–10.5)
nRBC: 0 % (ref 0.0–0.2)

## 2022-01-19 LAB — CREATININE, URINE, RANDOM: Creatinine, Urine: 165.44 mg/dL

## 2022-01-19 LAB — MRSA NEXT GEN BY PCR, NASAL: MRSA by PCR Next Gen: NOT DETECTED

## 2022-01-19 LAB — TROPONIN I (HIGH SENSITIVITY): Troponin I (High Sensitivity): 38 ng/L — ABNORMAL HIGH (ref <18)

## 2022-01-19 LAB — PROCALCITONIN: Procalcitonin: <0.1 ng/mL

## 2022-01-19 LAB — SODIUM, URINE, RANDOM: Sodium, Ur: 12 mmol/L

## 2022-01-19 LAB — CBG MONITORING, ED: Glucose-Capillary: 165 mg/dL — ABNORMAL HIGH (ref 70–99)

## 2022-01-19 MED ORDER — SODIUM CHLORIDE 0.9 % IV SOLN: 500.0000 mg | INTRAVENOUS | Status: DC

## 2022-01-19 MED ORDER — ALBUTEROL SULFATE (2.5 MG/3ML) 0.083% IN NEBU
2.5000 mg | INHALATION_SOLUTION | RESPIRATORY_TRACT | Status: DC | PRN
  Administered 2022-01-19 (×2): 2.5 mg via RESPIRATORY_TRACT
  Filled 2022-01-19 (×2): qty 3

## 2022-01-19 MED ORDER — ONDANSETRON HCL 4 MG/2ML IJ SOLN
4.0000 mg | Freq: Once | INTRAMUSCULAR | Status: AC
  Administered 2022-01-19: 4 mg via INTRAVENOUS

## 2022-01-19 MED ORDER — GUAIFENESIN-DM 100-10 MG/5ML PO SYRP
10.0000 mL | ORAL_SOLUTION | ORAL | Status: DC | PRN
  Administered 2022-01-19 – 2022-01-20 (×4): 10 mL via ORAL
  Filled 2022-01-19 (×4): qty 10

## 2022-01-19 MED ORDER — BENZONATATE 100 MG PO CAPS
200.0000 mg | ORAL_CAPSULE | Freq: Three times a day (TID) | ORAL | Status: DC
  Administered 2022-01-19 – 2022-01-20 (×5): 200 mg via ORAL
  Filled 2022-01-19 (×5): qty 2

## 2022-01-19 MED ORDER — HYDRALAZINE HCL 20 MG/ML IJ SOLN
10.0000 mg | Freq: Four times a day (QID) | INTRAMUSCULAR | Status: DC | PRN
  Administered 2022-01-19: 10 mg via INTRAVENOUS
  Filled 2022-01-19: qty 1

## 2022-01-19 MED ORDER — ONDANSETRON HCL 4 MG/2ML IJ SOLN
INTRAMUSCULAR | Status: AC
  Filled 2022-01-19: qty 2

## 2022-01-19 MED ORDER — TORSEMIDE 20 MG PO TABS: 20.0000 mg | ORAL_TABLET | Freq: Every day | ORAL | Status: DC

## 2022-01-19 MED ORDER — LACTATED RINGERS IV SOLN: INTRAVENOUS | Status: DC

## 2022-01-19 MED ORDER — ATORVASTATIN CALCIUM 80 MG PO TABS
80.0000 mg | ORAL_TABLET | Freq: Every day | ORAL | Status: DC
  Administered 2022-01-19 – 2022-01-20 (×2): 80 mg via ORAL
  Filled 2022-01-19: qty 2
  Filled 2022-01-19: qty 1

## 2022-01-19 MED ORDER — ONDANSETRON HCL 4 MG/2ML IJ SOLN: 4.0000 mg | Freq: Four times a day (QID) | INTRAMUSCULAR | Status: DC | PRN

## 2022-01-19 MED ORDER — SODIUM CHLORIDE 0.9 % IV SOLN: 1.0000 g | INTRAVENOUS | Status: DC

## 2022-01-19 NOTE — Progress Notes (Signed)
?  Transition of Care (TOC) Screening Note ? ? ?Patient Details  ?Name: [name] ?Date of Birth: 12/25/1965 ? ? ?Transition of Care (TOC) CM/SW Contact:    ?Pepper Kerrick, LCSW ?Phone Number: ?01/19/2022, 3:24 PM ? ? ? ?Transition of Care Department [facility]) has reviewed patient and no TOC needs have been identified at this time. We will continue to monitor patient advancement through interdisciplinary progression rounds. Patient will benefit from PT/OT consult for disposition recommendations. If new patient transition needs arise, please place a TOC consult. ?  ?

## 2022-01-19 NOTE — Progress Notes (Signed)
Admission from the ED by bed awake and alert. 

## 2022-01-19 NOTE — Progress Notes (Signed)
Cardiac Individual Treatment Plan ? ?Patient Details  ?Name: Billy [name] ?MRN: [id number] ?Date of Birth: 07/21/1966 ?Referring Provider:   ?Flowsheet Row CARDIAC REHAB PHASE II ORIENTATION from 12/09/2021 in [place]  ?Referring Provider Skeet Latch, MD  ? ?  ? ? ?Initial Encounter Date:  ?Flowsheet Row CARDIAC REHAB PHASE II ORIENTATION from 12/09/2021 in [place]  ?Date 12/09/21  ? ?  ? ? ?Visit Diagnosis: 07/07/21 STEMI ? ?07/07/21 PCI/DES Diagonal ? ?07/08/21 CABG x 4 ? ?Patient's Home Medications on Admission: ?No current facility-administered medications for this encounter. ? ?Current Outpatient Medications:  ?  atorvastatin (LIPITOR) 80 MG tablet, Take 1 tablet (80 mg total) by mouth daily., Disp: 30 tablet, Rfl: 1 ?  benzonatate (TESSALON) 200 MG capsule, Take 1 capsule (200 mg total) by mouth 3 (three) times daily., Disp: 20 capsule, Rfl: 0 ?  guaiFENesin-dextromethorphan (ROBITUSSIN DM) 100-10 MG/5ML syrup, Take 10 mLs by mouth every 4 (four) hours as needed for cough., Disp: 118 mL, Rfl: 0 ? ?Facility-Administered Medications Ordered in Other Encounters:  ?  albuterol (PROVENTIL) (2.5 MG/3ML) 0.083% nebulizer solution 2.5 mg, 2.5 mg, Nebulization, Q4H PRN, Shalhoub, Sherryll Burger, MD, 2.5 mg at 01/19/22 2125 ?  ALPRAZolam Duanne Moron) tablet 2 mg, 2 mg, Oral, TID PRN, Tu, Ching T, DO, 2 mg at 01/19/22 2138 ?  apixaban (ELIQUIS) tablet 5 mg, 5 mg, Oral, BID, Tu, Ching T, DO, 5 mg at 01/19/22 2135 ?  atorvastatin (LIPITOR) tablet 80 mg, 80 mg, Oral, Daily, Larey Dresser, MD, 80 mg at 01/19/22 1140 ?  benzonatate (TESSALON) capsule 200 mg, 200 mg, Oral, TID, Hongalgi, Anand D, MD, 200 mg at 01/19/22 2135 ?  carvedilol (COREG) tablet 6.25 mg, 6.25 mg, Oral, BID WC, Tu, Ching T, DO, 6.25 mg at 01/19/22 1624 ?  cholecalciferol (VITAMIN D3) tablet 2,000 Units, 2,000 Units, Oral, Daily, Tu, Ching T, DO, 2,000 Units at 01/19/22 0836 ?  clopidogrel  (PLAVIX) tablet 75 mg, 75 mg, Oral, Daily, Tu, Ching T, DO, 75 mg at 01/19/22 2778 ?  dapagliflozin propanediol (FARXIGA) tablet 10 mg, 10 mg, Oral, Daily, Clegg, Amy D, NP ?  guaiFENesin-dextromethorphan (ROBITUSSIN DM) 100-10 MG/5ML syrup 10 mL, 10 mL, Oral, Q4H PRN, Hongalgi, Anand D, MD, 10 mL at 01/20/22 0546 ?  hydrALAZINE (APRESOLINE) tablet 12.5 mg, 12.5 mg, Oral, Q8H, Clegg, Amy D, NP ?  insulin aspart (novoLOG) injection 0-9 Units, 0-9 Units, Subcutaneous, TID WC, Tu, Ching T, DO, 1 Units at 01/20/22 2423 ?  methocarbamol (ROBAXIN) tablet 500 mg, 500 mg, Oral, TID PRN, Tu, Ching T, DO ?  ondansetron (ZOFRAN) injection 4 mg, 4 mg, Intravenous, Q6H PRN, Shalhoub, Sherryll Burger, MD ?  tamsulosin [facility]) capsule 0.4 mg, 0.4 mg, Oral, Daily, Tu, Ching T, DO, 0.4 mg at 01/19/22 0836 ?  traZODone (DESYREL) tablet 100 mg, 100 mg, Oral, QHS, Tu, Ching T, DO, 100 mg at 01/19/22 2135 ?  zinc sulfate capsule 220 mg, 220 mg, Oral, Daily, Tu, Ching T, DO, 220 mg at 01/19/22 0835 ? ?Past Medical History: ?Past Medical History:  ?Diagnosis Date  ? Anxiety   ? Chronic combined systolic and diastolic heart failure ([place]) 09/22/2021  ? Depression   ? Diabetic neuropathy ([place])   ? Diabetic retinopathy ([place])   ? visual impairment  ? Dysrhythmia   ? "palpatations sometimes" (06/30/2017)  ? Exertional dyspnea 06/30/2021  ? Family history of adverse reaction to anesthesia   ? sister had  OR 2016; "couldn't wake up; could hear what they were saying but couldn't get their attention; like I was paralyzed but fully awake" (06/30/2017)  ? Pneumonia X 2  ? SVT (supraventricular tachycardia) ([place])   ? Toe ulcer ([place]) 06/30/2017  ? 2nd digit  ? Type II diabetes mellitus ([place])   ? ? ?Tobacco Use: ?Social History  ? ?Tobacco Use  ?Smoking Status Never  ?Smokeless Tobacco Never  ? ? ?Labs: ?Review Flowsheet   ? ?  ?  Latest Ref Rng & Units 07/20/2021 07/21/2021 07/22/2021 07/23/2021  ?Labs for ITP Cardiac and Pulmonary Rehab  ?Hemoglobin A1c 4.8 - 5.6  %      ?O2 Saturation % 70.1   68.6   63.7   38.3    ? 52.1    ? ?  01/19/2022  ?Labs for ITP Cardiac and Pulmonary Rehab  ?Hemoglobin A1c 6.9    ?O2 Saturation   ?  ? ? Multiple values from one day are sorted in reverse-chronological order  ?  ?  ? ? ?Capillary Blood Glucose: ?Lab Results  ?Component Value Date  ? GLUCAP 134 (H) 01/20/2022  ? GLUCAP 205 (H) 01/19/2022  ? GLUCAP 182 (H) 01/19/2022  ? GLUCAP 165 (H) 01/19/2022  ? GLUCAP 136 (H) 01/12/2022  ? ? ? ?Exercise Target Goals: ?Exercise Program Goal: ?Individual exercise prescription set using results from initial 6 min walk test and THRR while considering  patient?s activity barriers and safety.  ? ?Exercise Prescription Goal: ?Initial exercise prescription builds to 30-45 minutes a day of aerobic activity, 2-3 days per week.  Home exercise guidelines will be given to patient during program as part of exercise prescription that the participant will acknowledge. ? ?Activity Barriers & Risk Stratification: ? Activity Barriers & Cardiac Risk Stratification - 12/09/21 1004   ? ?  ? Activity Barriers & Cardiac Risk Stratification  ? Activity Barriers History of Falls;Other (comment)   ? Comments Visually impaired, peripheral neuropathy- both feet.   ? Cardiac Risk Stratification High   ? ?  ?  ? ?  ? ? ?6 Minute Walk: ? 6 Minute Walk   ? ? [place] Name 12/09/21 1001  ?  ?  ?  ? 6 Minute Walk  ? Phase Initial    ? Distance 1632 feet    ? Walk Time 6 minutes    ? # of Rest Breaks 0    ? MPH 3.09    ? METS 3.79    ? RPE 10    ? Perceived Dyspnea  0    ? VO2 Peak 13.57    ? Symptoms Yes (comment)    ? Comments Bilateral hip pain "3-4/10" on pain scale.    ? Resting HR 74 bpm    ? Resting BP 122/80    ? Resting Oxygen Saturation  95 %    ? Exercise Oxygen Saturation  during 6 min walk 98 %    ? Max Ex. HR 84 bpm    ? Max Ex. BP 142/86    ? 2 Minute Post BP 148/90    ? ?  ?  ? ?  ? ? ?Oxygen Initial Assessment: ? ? ?Oxygen Re-Evaluation: ? ? ?Oxygen Discharge (Final Oxygen  Re-Evaluation): ? ? ?Initial Exercise Prescription: ? Initial Exercise Prescription - 12/09/21 1100   ? ?  ? Date of Initial Exercise RX and Referring Provider  ? Date 12/09/21   ? Referring Provider Skeet Latch, MD   ? Expected  Discharge Date 02/06/22   ?  ? Bike  ? Level 2   ? Minutes 15   ? METs 3.5   ?  ? NuStep  ? Level 3   ? SPM 85   ? Minutes 15   ? METs 3   ?  ? Prescription Details  ? Frequency (times per week) 3   ? Duration Progress to 30 minutes of continuous aerobic without signs/symptoms of physical distress   ?  ? Intensity  ? THRR 40-80% of Max Heartrate 66-132   ? Ratings of Perceived Exertion 11-13   ? Perceived Dyspnea 0-4   ?  ? Progression  ? Progression Continue to progress workloads to maintain intensity without signs/symptoms of physical distress.   ?  ? Resistance Training  ? Training Prescription Yes   ? Weight 4 lbs   ? Reps 10-15   ? ?  ?  ? ?  ? ? ?Perform Capillary Blood Glucose checks as needed. ? ?Exercise Prescription Changes: ? ? Exercise Prescription Changes   ? ? [place] Name 12/15/21 1044 12/22/21 1029 01/05/22 1022 01/16/22 1023  ?  ?  ? Response to Exercise  ? Blood Pressure (Admit) 128/70 122/70 110/70 124/80   ? Blood Pressure (Exercise) 142/80 130/70 108/78 110/82   ? Blood Pressure (Exit) 142/80 120/82 120/60 106/66   ? Heart Rate (Admit) 79 bpm 72 bpm 76 bpm 80 bpm   ? Heart Rate (Exercise) 82 bpm 95 bpm 83 bpm 82 bpm   ? Heart Rate (Exit) 72 bpm 72 bpm 74 bpm 80 bpm   ? Rating of Perceived Exertion (Exercise) 11 11 13.5 12   ? Symptoms None None None None   ? Comments Off to a good start with exercise. Increased WL on bike, increased weights. Switched from stationary bike to arm ergometer today. --   ? Duration Continue with 30 min of aerobic exercise without signs/symptoms of physical distress. Continue with 30 min of aerobic exercise without signs/symptoms of physical distress. Continue with 30 min of aerobic exercise without signs/symptoms of physical distress.  Continue with 30 min of aerobic exercise without signs/symptoms of physical distress.   ? Intensity THRR unchanged THRR unchanged THRR unchanged THRR unchanged   ?  ? Progression  ? Progression Continue to

## 2022-01-19 NOTE — ED Notes (Signed)
Ambulated to restroom to have bm at this time ?

## 2022-01-19 NOTE — ED Notes (Signed)
Breakfast order placed ?

## 2022-01-19 NOTE — ED Notes (Signed)
Spoke with Dr. Glo Herring about this pt current VS . ?

## 2022-01-19 NOTE — Progress Notes (Addendum)
? ? Advanced Heart Failure Rounding Note ? ?PCP-Cardiologist: Skeet Latch, MD  ?AHF: Dr. Aundra Dubin  ? ?Patient Profile  ? ?Billy [name] is a 56 y.o. male with CAD s/p STEMI and DES to culprit D1 (07/07/21) with subsequent CABG x4 (07/08/21, reverse SVG-diag, reverse SVG-PDA, LRA-OM, SVG-RCA, L sided MAZE with box set, 40 mm atrial clip), persistent AF, HFrEF (EF 40-45%), and HTN, who presented to ED w/ chest congestion +URI symptoms and ruled in for Flu. Cardiology initially consulted for abnormal EKG, initially concerning for inferior STEMI, however did not meet STEMI criteria. Also w/ AKI, SCr 3.01 (BL ~1.8).  ? ?Subjective:   ? ?Denies ischemic like CP. Notes development of chest tightness related to chest congestion ~2 days ago. Not worsened by exertion. Initially had pleuritic like CP but now resolved.  ? ?Hs trop 34>>38.  ? ?Notes nasal congestion, fatigue and chills.  ? ?Parainfluenza Virus 3 + ?CXR negative for PNA. No edema. Chest CT also unremarkable. PCT <0.10  ? ?ReDs Clip 33%  ? ?Getting IVFs in the ED. Starting to feel better. No current CP. SCr trending down, 3.01>>2.79  ? ?Echo pending.  ? ? ?Objective:   ?Weight Range: ?109.8 kg ?Body mass index is 31.07 kg/m?.  ? ?Vital Signs:   ?Temp:  [98.7 ?F (37.1 ?C)-99.4 ?F (37.4 ?C)] 99.4 ?F (37.4 ?C) (05/07 1822) ?Pulse Rate:  [60-121] 93 (05/08 0700) ?Resp:  [11-25] 22 (05/08 0700) ?BP: (99-181)/(65-105) 130/86 (05/08 0700) ?SpO2:  [83 %-100 %] 94 % (05/08 0700) ?Weight:  [109.8 kg] 109.8 kg (05/07 1820) ?  ? ?Weight change: ?Filed Weights  ? 01/18/22 1820  ?Weight: 109.8 kg  ? ? ?Intake/Output:  ? ?Intake/Output Summary (Last 24 hours) at 01/19/2022 0846 ?Last data filed at 01/18/2022 2312 ?Gross per 24 hour  ?Intake 1807.42 ml  ?Output --  ?Net 1807.42 ml  ?  ? ? ?Physical Exam  ?  ?ReDs Clip 33%  ?General:  flushed appearing. No resp difficulty ?HEENT: Normal ?Neck: Supple. Thick neck, JVP not well visualized. Carotids 2+ bilat; no bruits. No  lymphadenopathy or thyromegaly appreciated. ?Cor: PMI nondisplaced. Regular rate & rhythm. No rubs, gallops or murmurs. ?Lungs: Clear ?Abdomen: Soft, nontender, nondistended. No hepatosplenomegaly. No bruits or masses. Good bowel sounds. ?Extremities: No cyanosis, clubbing, rash, edema ?Neuro: Alert & orientedx3, cranial nerves grossly intact. moves all 4 extremities w/o difficulty. Affect pleasant ? ? ?Telemetry  ? ?NSR  ? ?EKG  ?  ?NSR 71 bpm, subtle inferior STE, similar to prior EKG  ? ?Labs  ?  ?CBC ?Recent Labs  ?  01/18/22 ?2979 01/19/22 ?0241  ?WBC 4.9 5.1  ?NEUTROABS 2.7  --   ?HGB 13.0 14.2  ?HCT 38.8* 42.4  ?MCV 96.0 95.5  ?PLT 138* 127*  ? ?Basic Metabolic Panel ?Recent Labs  ?  01/18/22 ?8921 01/19/22 ?0241  ?NA 136 140  ?K 4.8 4.8  ?CL 104 105  ?CO2 24 27  ?GLUCOSE 155* 135*  ?BUN 31* 31*  ?CREATININE 3.01* 2.79*  ?CALCIUM 8.2* 8.7*  ? ?Liver Function Tests ?Recent Labs  ?  01/18/22 ?1814  ?AST 24  ?ALT 11  ?ALKPHOS 36*  ?BILITOT 1.1  ?PROT 6.0*  ?ALBUMIN 3.2*  ? ?Recent Labs  ?  01/18/22 ?1814  ?LIPASE 32  ? ?Cardiac Enzymes ?No results for input(s): CKTOTAL, CKMB, CKMBINDEX, TROPONINI in the last 72 hours. ? ?BNP: ?BNP (last 3 results) ?Recent Labs  ?  09/24/21 ?1038 10/10/21 ?1941 10/20/21 ?1126  ?BNP 1,157.5*  1,243.7* 771.4*  ? ? ?ProBNP (last 3 results) ?No results for input(s): PROBNP in the last 8760 hours. ? ? ?D-Dimer ?No results for input(s): DDIMER in the last 72 hours. ?Hemoglobin A1C ?Recent Labs  ?  01/19/22 ?0241  ?HGBA1C 6.9*  ? ?Fasting Lipid Panel ?No results for input(s): CHOL, HDL, LDLCALC, TRIG, CHOLHDL, LDLDIRECT in the last 72 hours. ?Thyroid Function Tests ?No results for input(s): TSH, T4TOTAL, T3FREE, THYROIDAB in the last 72 hours. ? ?Invalid input(s): FREET3 ? ?Other results: ? ? ?Imaging  ? ? ?DG CHEST PORT 1 VIEW ? ?Result Date: 01/19/2022 ?CLINICAL DATA:  Chest pain, coughing, congestion and shortness of breath. EXAM: PORTABLE CHEST 1 VIEW COMPARISON:  Chest CT and portable  chest both from yesterday. FINDINGS: The heart size and mediastinal contours are within normal limits with prior CABG. Mild aortic atherosclerosis. Both lungs are clear with asymmetric mildly elevated left hemidiaphragm. The visualized skeletal structures are unremarkable. There are numerous overlying monitor wires. IMPRESSION: No active disease.  Stable chest. Electronically Signed   By: Telford Nab M.D.   On: 01/19/2022 04:49  ? ?DG Chest Portable 1 View ? ?Result Date: 01/18/2022 ?CLINICAL DATA:  Weakness, chest pain EXAM: PORTABLE CHEST 1 VIEW COMPARISON:  09/24/2021 FINDINGS: Prior CABG. Mild elevation of the left hemidiaphragm, stable. Heart and mediastinal contours are within normal limits. No focal opacities or effusions. No acute bony abnormality. IMPRESSION: No active cardiopulmonary disease. Electronically Signed   By: Rolm Baptise M.D.   On: 01/18/2022 19:05  ? ?CT CHEST ABDOMEN PELVIS WO CONTRAST ? ?Result Date: 01/18/2022 ?CLINICAL DATA:  Abdominal pain, acute, nonlocalized sepsis of unclear source EXAM: CT CHEST, ABDOMEN AND PELVIS WITHOUT CONTRAST TECHNIQUE: Multidetector CT imaging of the chest, abdomen and pelvis was performed following the standard protocol without IV contrast. RADIATION DOSE REDUCTION: This exam was performed according to the departmental dose-optimization program which includes automated exposure control, adjustment of the mA and/or kV according to patient size and/or use of iterative reconstruction technique. COMPARISON:  03/06/2016 FINDINGS: CT CHEST FINDINGS Cardiovascular: Prior CABG. Heart is normal size. Aortic calcifications. No aneurysm. Mediastinum/Nodes: No mediastinal, hilar, or axillary adenopathy. Trachea and esophagus are unremarkable. Thyroid unremarkable. Lungs/Pleura: Lungs are clear. No focal airspace opacities or suspicious nodules. No effusions. Musculoskeletal: Chest wall soft tissues are unremarkable. No acute bony abnormality. CT ABDOMEN PELVIS FINDINGS  Hepatobiliary: No focal hepatic abnormality. Gallbladder unremarkable. Pancreas: No focal abnormality or ductal dilatation. Spleen: No focal abnormality.  Normal size. Adrenals/Urinary Tract: No adrenal abnormality. No focal renal abnormality. No stones or hydronephrosis. Urinary bladder is unremarkable. Stomach/Bowel: Stomach, large and small bowel grossly unremarkable. Normal appendix. Vascular/Lymphatic: Aortic atherosclerosis. No evidence of aneurysm or adenopathy. Reproductive: No visible focal abnormality. Other: No free fluid or free air. Musculoskeletal: No acute bony abnormality. Degenerative changes in the lumbar spine. Bilateral L5 pars defects with grade 1 anterolisthesis at L5-S1. IMPRESSION: No acute findings or clear source of sepsis seen in the chest, abdomen or pelvis. Prior CABG. Aortic atherosclerosis. Electronically Signed   By: Rolm Baptise M.D.   On: 01/18/2022 20:11   ? ? ?Medications:   ? ? ?Scheduled Medications: ? apixaban  5 mg Oral BID  ? benzonatate  200 mg Oral TID  ? carvedilol  6.25 mg Oral BID WC  ? cholecalciferol  2,000 Units Oral Daily  ? clopidogrel  75 mg Oral Daily  ? insulin aspart  0-9 Units Subcutaneous TID WC  ? tamsulosin  0.4 mg Oral Daily  ?  traZODone  100 mg Oral QHS  ? zinc sulfate  220 mg Oral Daily  ? ? ?Infusions: ? lactated ringers 75 mL/hr at 01/19/22 0844  ? ? ?PRN Medications: ?albuterol, alprazolam, guaiFENesin-dextromethorphan, hydrALAZINE, methocarbamol, ondansetron (ZOFRAN) IV ? ? ? ? ?Patient Profile  ? ?Billy [name] is a 56 y.o. male with CAD s/p STEMI and DES to culprit D1 (07/07/21) with subsequent CABG x4 (07/08/21, reverse SVG-diag, reverse SVG-PDA, LRA-OM, SVG-RCA, L sided MAZE with box set, 40 mm atrial clip), persistent AF, HFrEF (EF 40-45%), and HTN, who presented to ED w/ chest congestion +URI symptoms and ruled in for Flu. Cardiology initially consulted for abnormal EKG, initially concerning for inferior STEMI, however did not meet STEMI  criteria. Also w/ AKI, SCr 3.01 (BL ~1.8).  ? ?Assessment/Plan  ? ?Parainfluenza Virus 3 + ?- CXR negative for PNA. Chest CT also unremarkable. PCT <0.10  ?- supportive care/ management per primary team  ? ?2. Chest

## 2022-01-19 NOTE — ED Notes (Signed)
Spoke with cardio made aware about the pt hydralazine. Per provider we could wait on that at this time. Pt made aware of same ?

## 2022-01-19 NOTE — ED Notes (Signed)
Pt has an internal CBG monitor and is currently reading 127. Pt refuses to be stuck again. MD notified and is okay with it.  ?

## 2022-01-19 NOTE — Progress Notes (Deleted)
Started pulling out telemetry leads,  refused to give the cable back to put back on the monitor.  Agitated and restless. Ativan iv given . Continue to monitor. ?

## 2022-01-19 NOTE — Progress Notes (Signed)
ReDS Vest / Clip - 01/19/22 0900   ? ?  ? ReDS Vest / Clip  ? Station Marker D   ? Ruler Value 36   ? ReDS Value Range Low volume   ? ReDS Actual Value 33   ? Anatomical Comments lying   ? ?  ?  ? ?  ? ? ?

## 2022-01-19 NOTE — ED Notes (Signed)
Noted decrease in O2 sats while pt sleeping. Placed pt on [place] 2lpm at this time ?

## 2022-01-19 NOTE — ED Notes (Signed)
Per pt he almost fell coming out of restroom. Pt is currently vomiting and cardio provider is at bedside ?

## 2022-01-19 NOTE — ED Notes (Signed)
Admit provider paged by secretary at this time so that this RN could make them aware of current VS at this time. Resp also called and made aware of same ?

## 2022-01-19 NOTE — ED Notes (Signed)
Verbal report given to Katrina Y RN at this time ?

## 2022-01-19 NOTE — Progress Notes (Signed)
?PROGRESS NOTE ?  ?Billy [name]  WUJ:[id number]    DOB: 1965-10-24    DOA: 01/18/2022 ? ?PCP: Reynold Bowen, MD  ? ?I have briefly reviewed patients previous medical records in [facility]. ? ?Chief Complaint  ?Patient presents with  ? Chest Pain  ? Cough  ? Nasal Congestion  ? Nausea  ? ? ?Brief Narrative:  ?56 year old male, lives alone, independent, medical history significant for CAD s/p DES with subsequent CABG times 410/25/22, persistent A-fib, s/p LAA clipping, chronic systolic CHF, HTN, DM, CKD stage III, SVT s/p ablation, anxiety and depression, presented to ED with 3 to 4 days history of nonproductive cough, head and chest congestion, chest pressure, chills and rigors, poor oral intake and eventually got dizzy and decided to call EMS.  Per report, SBP in the 90s and oxygen saturation in the 70s by EMS.  Initially brought to the ED as a code STEMI, cardiology team saw him on arrival (as per EDP note), EKG similar to previous, no chest pain and code STEMI was canceled.  Admitted for acute viral bronchitis, acute respiratory failure with hypoxia and acute kidney injury complicating stage IIIb disease.  Advanced HF Cardiology consulting. ? ? ?Assessment & Plan:  ?Principal Problem: ?  Viral bronchitis ?Active Problems: ?  Essential hypertension ?  Acute renal failure superimposed on stage 3b chronic kidney disease ([place]) ?  Atrial fibrillation ([place]) ?  S/P CABG x 4 ?  AKI (acute kidney injury) ([place]) ?  Chronic combined systolic and diastolic heart failure ([place]) ?  Hyperlipidemia ?  Type 2 diabetes mellitus with proliferative retinopathy without macular edema ([facility]) ? ? ?Acute viral bronchitis: Flu and COVID RT-PCR negative.  RVP positive for parainfluenza virus 3+.  Blood cultures x2: NTD.  Chest x-ray x2 and CT chest abdomen and pelvis: No pneumonia or acute findings.  Treat supportively and monitor.  Procalcitonin negative.  Empirically started antibiotics for pneumonia discontinued. ? ?Acute respiratory  failure with hypoxia: As per report, was hypoxic to 70% with EMS.  Also note of hypoxia with sleep.  Currently resolved and saturating in high 90s on room air.  Monitor oxygen saturations, check bedtime sats especially.  May have OSA and will need to be tested as outpatient. ? ?Acute kidney injury complicating stage IIIb CKD: Creatinine elevated from 1.91 at recent baseline to 3.01.  Suspected due to poor oral intake, dehydration, some ibuprofen use at home and Lasix at home.  Hold culprit medications.  Brief and gentle IV fluids.  Creatinine down to 2.75.  Trend daily creatinine. ? ?Essential hypertension: Reportedly SBP with 90s by EMS.  Continue carvedilol.  Holding hydralazine. ? ?Type II DM with renal complications: Monitor CBGs and continue SSI.  Patient prefers to use his device for CBG checks and that is okay. ? ?Hyperlipidemia: Continue statins. ? ?CAD, s/p MI, s/p CABG: Chest pain atypical and is musculoskeletal/pleuritic in nature due to acute bronchitis.  High-sensitivity troponins, mildly elevated and not consistent with ACS.  No aspirin due to being on Plavix and Eliquis, continue.  Continue atorvastatin and carvedilol. ? ?Chronic systolic CHF/ischemic cardiomyopathy: Echo 07/28/2021: LVEF 40-45%.  Clinically not volume overloaded.  Reds clip 33.  Holding Badin and diuretics.  Cardiology input appreciated.  Checking repeat echo. ? ?Persistent A-fib: S/p maze and LAA clipping.  Maintaining sinus rhythm.  Continue beta-blockers and Eliquis. ? ?Thrombocytopenia: May be due to acute viral illness.  Follow CBC closely. ? ?Body mass index is 31.07 kg/m?. ? ? ? ?DVT  prophylaxis:   On full dose Eliquis ?  Code Status: Full Code:  ?Family Communication: None at bedside ?Disposition:  ?Status is: Inpatient ?Remains inpatient appropriate because: IVF ?  ? ? ?Consultants:   ?Cardiology ? ?Procedures:   ? ? ?Antimicrobials:   ? ? ? ?Subjective:  ?Reports that he feels better.  Decreased cough, nonproductive and  decreased congestion.  Reports chronic precordial chest pain since bypass surgery, his thoracic surgeon aware.  Some hoarseness.  Denies dyspnea.  Mild chest tightness with coughing.  Had some nausea and dry heaving earlier on.  Poor oral intake. ? ?Objective:  ? ?Vitals:  ? 01/19/22 0700 01/19/22 0800 01/19/22 0900 01/19/22 1000  ?BP: 130/86 (!) 141/84 (!) 165/88 128/84  ?Pulse: 93 94 (!) 101 90  ?Resp: (!) '22 18 13 '$ (!) 29  ?Temp:      ?TempSrc:      ?SpO2: 94% 92% 98% 98%  ?Weight:      ?Height:      ? ? ?General exam: Young male, moderately built and obese sitting up comfortably in bed without distress. ?Respiratory system: Slightly harsh breath sounds bilaterally without wheezing, rhonchi or crackles.  No increased work of breathing.  Mildly hoarse. ?Cardiovascular system: S1 & S2 heard, RRR. No JVD, murmurs, rubs, gallops or clicks. No pedal edema.  Telemetry personally reviewed: Sinus rhythm. ?Gastrointestinal system: Abdomen is nondistended, soft and nontender. No organomegaly or masses felt. Normal bowel sounds heard. ?Central nervous system: Alert and oriented. No focal neurological deficits. ?Extremities: Symmetric 5 x 5 power. ?Skin: No rashes, lesions or ulcers ?Psychiatry: Judgement and insight appear normal. Mood & affect appropriate.  ? ? ? ?Data Reviewed:   ?I have personally reviewed following labs and imaging studies ? ? ?CBC: ?Recent Labs  ?Lab 01/18/22 ?3536 01/19/22 ?0241  ?WBC 4.9 5.1  ?NEUTROABS 2.7  --   ?HGB 13.0 14.2  ?HCT 38.8* 42.4  ?MCV 96.0 95.5  ?PLT 138* 127*  ? ? ?Basic Metabolic Panel: ?Recent Labs  ?Lab 01/18/22 ?1443 01/19/22 ?0241  ?NA 136 140  ?K 4.8 4.8  ?CL 104 105  ?CO2 24 27  ?GLUCOSE 155* 135*  ?BUN 31* 31*  ?CREATININE 3.01* 2.79*  ?CALCIUM 8.2* 8.7*  ? ? ?Liver Function Tests: ?Recent Labs  ?Lab 01/18/22 ?1814  ?AST 24  ?ALT 11  ?ALKPHOS 36*  ?BILITOT 1.1  ?PROT 6.0*  ?ALBUMIN 3.2*  ? ? ?CBG: ?No results for input(s): GLUCAP in the last 168 hours. ? ?Microbiology Studies:   ? ?Recent Results (from the past 240 hour(s))  ?Blood culture (routine x 2)     Status: None (Preliminary result)  ? Collection Time: 01/18/22  6:19 PM  ? Specimen: BLOOD  ?Result Value Ref Range Status  ? Specimen Description BLOOD RIGHT ANTECUBITAL  Final  ? Special Requests   Final  ?  BOTTLES DRAWN AEROBIC AND ANAEROBIC Blood Culture adequate volume  ? Culture   Final  ?  NO GROWTH < 12 HOURS ?Performed at [facility], [place] [address] [place], [place] [address] ?  ? Report Status PENDING  Incomplete  ?Blood culture (routine x 2)     Status: None (Preliminary result)  ? Collection Time: 01/18/22  6:24 PM  ? Specimen: BLOOD RIGHT HAND  ?Result Value Ref Range Status  ? Specimen Description BLOOD RIGHT HAND  Final  ? Special Requests   Final  ?  BOTTLES DRAWN AEROBIC AND ANAEROBIC Blood Culture adequate volume  ? Culture  Final  ?  NO GROWTH < 12 HOURS ?Performed at [facility], [place] [address] [place], [place] [address] ?  ? Report Status PENDING  Incomplete  ?Resp Panel by RT-PCR (Flu A&B, Covid) Nasopharyngeal Swab     Status: None  ? Collection Time: 01/18/22  6:56 PM  ? Specimen: Nasopharyngeal Swab; Nasopharyngeal(NP) swabs in vial transport medium  ?Result Value Ref Range Status  ? SARS Coronavirus 2 by RT PCR NEGATIVE NEGATIVE Final  ?  Comment: (NOTE) ?SARS-CoV-2 target nucleic acids are NOT DETECTED. ? ?The SARS-CoV-2 RNA is generally detectable in upper respiratory ?specimens during the acute phase of infection. The lowest ?concentration of SARS-CoV-2 viral copies this assay can detect is ?138 copies/mL. A negative result does not preclude SARS-Cov-2 ?infection and should not be used as the sole basis for treatment or ?other patient management decisions. A negative result may occur with  ?improper specimen collection/handling, submission of specimen other ?than nasopharyngeal swab, presence of viral mutation(s) within the ?areas targeted by this assay, and inadequate number of  viral ?copies(<138 copies/mL). A negative result must be combined with ?clinical observations, patient history, and epidemiological ?information. The expected result is Negative. ? ?Fact Sheet for Patients:  ?ht

## 2022-01-19 NOTE — Progress Notes (Signed)
HOSPITAL MEDICINE OVERNIGHT EVENT NOTE   ? ?Notified by nursing that patient has been exhibiting bouts of hypoxia with sleep, improved with initiation of supplemental O2 (now on 4lpm).  Dr. Velia Meyer ordered a repeat CXR due to notable tachypnea by the nursing staff (although no distress).  CXR shows no change from admission CXR. ? ?Bed request changed to progressive per nursing request due to bouts of hypoxia and tachypnea.   ? ?BP is also notably rising.  Order for PRN Hydralazine placed for high BP's. ? ?Billy Emerald  MD ?[facility]  ? ? ? ? ? ? ? ? ? ? ?

## 2022-01-19 NOTE — ED Notes (Signed)
Ambulated to restroom without assistance 

## 2022-01-20 ENCOUNTER — Other Ambulatory Visit (HOSPITAL_BASED_OUTPATIENT_CLINIC_OR_DEPARTMENT_OTHER): Payer: Medicare HMO

## 2022-01-20 ENCOUNTER — Encounter (HOSPITAL_COMMUNITY): Payer: Medicare HMO | Admitting: Cardiology

## 2022-01-20 LAB — CBC
HCT: 37.3 % — ABNORMAL LOW (ref 39.0–52.0)
Hemoglobin: 12.3 g/dL — ABNORMAL LOW (ref 13.0–17.0)
MCH: 31.1 pg (ref 26.0–34.0)
MCHC: 33 g/dL (ref 30.0–36.0)
MCV: 94.4 fL (ref 80.0–100.0)
Platelets: 132 10*3/uL — ABNORMAL LOW (ref 150–400)
RBC: 3.95 MIL/uL — ABNORMAL LOW (ref 4.22–5.81)
RDW: 13 % (ref 11.5–15.5)
WBC: 4.8 10*3/uL (ref 4.0–10.5)
nRBC: 0 % (ref 0.0–0.2)

## 2022-01-20 LAB — BASIC METABOLIC PANEL
Anion gap: 6 (ref 5–15)
BUN: 35 mg/dL — ABNORMAL HIGH (ref 6–20)
CO2: 27 mmol/L (ref 22–32)
Calcium: 8.3 mg/dL — ABNORMAL LOW (ref 8.9–10.3)
Chloride: 107 mmol/L (ref 98–111)
Creatinine, Ser: 2.35 mg/dL — ABNORMAL HIGH (ref 0.61–1.24)
GFR, Estimated: 32 mL/min — ABNORMAL LOW (ref >60)
Glucose, Bld: 180 mg/dL — ABNORMAL HIGH (ref 70–99)
Potassium: 4.5 mmol/L (ref 3.5–5.1)
Sodium: 140 mmol/L (ref 135–145)

## 2022-01-20 LAB — GLUCOSE, CAPILLARY
Glucose-Capillary: 134 mg/dL — ABNORMAL HIGH (ref 70–99)
Glucose-Capillary: 169 mg/dL — ABNORMAL HIGH (ref 70–99)

## 2022-01-20 MED ORDER — BENZONATATE 200 MG PO CAPS
200.0000 mg | ORAL_CAPSULE | Freq: Three times a day (TID) | ORAL | 0 refills | Status: DC
Start: 2022-01-20 — End: 2022-02-03

## 2022-01-20 MED ORDER — HYDRALAZINE HCL 25 MG PO TABS
12.5000 mg | ORAL_TABLET | Freq: Three times a day (TID) | ORAL | Status: DC
  Administered 2022-01-20 (×2): 12.5 mg via ORAL
  Filled 2022-01-20 (×2): qty 1

## 2022-01-20 MED ORDER — DAPAGLIFLOZIN PROPANEDIOL 10 MG PO TABS
10.0000 mg | ORAL_TABLET | Freq: Every day | ORAL | Status: DC
  Administered 2022-01-20: 10 mg via ORAL
  Filled 2022-01-20: qty 1

## 2022-01-20 MED ORDER — ATORVASTATIN CALCIUM 80 MG PO TABS: 80.0000 mg | ORAL_TABLET | Freq: Every day | ORAL | 1 refills | Status: DC

## 2022-01-20 MED ORDER — GUAIFENESIN-DM 100-10 MG/5ML PO SYRP: 10.0000 mL | ORAL_SOLUTION | ORAL | 0 refills | Status: DC | PRN

## 2022-01-20 NOTE — Discharge Instructions (Signed)

## 2022-01-20 NOTE — Progress Notes (Addendum)
? ? Advanced Heart Failure Rounding Note ? ?PCP-Cardiologist: Skeet Latch, MD  ?AHF: Dr. Aundra Dubin  ? ?Patient Profile  ? ?Billy [name] is a 56 y.o. male with CAD s/p STEMI and DES to culprit D1 (07/07/21) with subsequent CABG x4 (07/08/21, reverse SVG-diag, reverse SVG-PDA, LRA-OM, SVG-RCA, L sided MAZE with box set, 40 mm atrial clip), persistent AF, HFrEF (EF 40-45%), and HTN, who presented to ED w/ chest congestion +URI symptoms and ruled in for Flu. Cardiology initially consulted for abnormal EKG, initially concerning for inferior STEMI, however did not meet STEMI criteria. Also w/ AKI, SCr 3.01 (BL ~1.8).  ? ?Subjective:   ? ?Echo repeated. EF 55-60% Grade II DD ? ?He has been getting IV fluids. Creatinine trending down 3>2.8>2.35 ? ?Feels better. Denies SOB.  ? ?Objective:   ?Weight Range: ?109.8 kg ?Body mass index is 31.07 kg/m?.  ? ?Vital Signs:   ?Temp:  [98.1 ?F (36.7 ?C)-98.4 ?F (36.9 ?C)] 98.3 ?F (36.8 ?C) (05/09 0417) ?Pulse Rate:  [73-101] 73 (05/09 0417) ?Resp:  [13-29] 17 (05/09 0417) ?BP: (118-165)/(68-93) 147/86 (05/09 0417) ?SpO2:  [92 %-98 %] 98 % (05/09 0417) ?Last BM Date : 01/19/22 ? ?Weight change: ?Filed Weights  ? 01/18/22 1820  ?Weight: 109.8 kg  ? ? ?Intake/Output:  ? ?Intake/Output Summary (Last 24 hours) at 01/20/2022 0721 ?Last data filed at 01/20/2022 0533 ?Gross per 24 hour  ?Intake 1497.78 ml  ?Output 2 ml  ?Net 1495.78 ml  ?  ? ? ?Physical Exam  ?General:  Sitting on the side of the bed. No resp difficulty ?HEENT: normal ?Neck: supple. no JVD. Carotids 2+ bilat; no bruits. No lymphadenopathy or thryomegaly appreciated. ?Cor: PMI nondisplaced. Regular rate & rhythm. No rubs, gallops or murmurs. ?Lungs: clear ?Abdomen: soft, nontender, nondistended. No hepatosplenomegaly. No bruits or masses. Good bowel sounds. ?Extremities: no cyanosis, clubbing, rash, edema ?Neuro: alert & orientedx3, cranial nerves grossly intact. moves all 4 extremities w/o difficulty. Affect  pleasant ? ? ? ? ?Telemetry  ? ?NSR ? ?EKG  ?  ?NSR 71 bpm, subtle inferior STE, similar to prior EKG  ? ?Labs  ?  ?CBC ?Recent Labs  ?  01/18/22 ?0240 01/19/22 ?0241 01/20/22 ?0026  ?WBC 4.9 5.1 4.8  ?NEUTROABS 2.7  --   --   ?HGB 13.0 14.2 12.3*  ?HCT 38.8* 42.4 37.3*  ?MCV 96.0 95.5 94.4  ?PLT 138* 127* 132*  ? ?Basic Metabolic Panel ?Recent Labs  ?  01/19/22 ?0241 01/20/22 ?0026  ?NA 140 140  ?K 4.8 4.5  ?CL 105 107  ?CO2 27 27  ?GLUCOSE 135* 180*  ?BUN 31* 35*  ?CREATININE 2.79* 2.35*  ?CALCIUM 8.7* 8.3*  ? ?Liver Function Tests ?Recent Labs  ?  01/18/22 ?1814  ?AST 24  ?ALT 11  ?ALKPHOS 36*  ?BILITOT 1.1  ?PROT 6.0*  ?ALBUMIN 3.2*  ? ?Recent Labs  ?  01/18/22 ?1814  ?LIPASE 32  ? ?Cardiac Enzymes ?No results for input(s): CKTOTAL, CKMB, CKMBINDEX, TROPONINI in the last 72 hours. ? ?BNP: ?BNP (last 3 results) ?Recent Labs  ?  09/24/21 ?1038 10/10/21 ?9735 10/20/21 ?1126  ?BNP 1,157.5* 1,243.7* 771.4*  ? ? ?ProBNP (last 3 results) ?No results for input(s): PROBNP in the last 8760 hours. ? ? ?D-Dimer ?No results for input(s): DDIMER in the last 72 hours. ?Hemoglobin A1C ?Recent Labs  ?  01/19/22 ?0241  ?HGBA1C 6.9*  ? ?Fasting Lipid Panel ?No results for input(s): CHOL, HDL, LDLCALC, TRIG, CHOLHDL, LDLDIRECT in the last  72 hours. ?Thyroid Function Tests ?No results for input(s): TSH, T4TOTAL, T3FREE, THYROIDAB in the last 72 hours. ? ?Invalid input(s): FREET3 ? ?Other results: ? ? ?Imaging  ? ? ?ECHOCARDIOGRAM COMPLETE ? ?Result Date: 01/19/2022 ?   ECHOCARDIOGRAM REPORT   Patient Name:   Billy [name] Date of Exam: 01/19/2022 Medical Rec #:  [id number]      Height:       74.0 in Accession #:    [id number]     Weight:       242.0 lb Date of Birth:  03/20/1966      BSA:          2.357 m? Patient Age:    71 years       BP:           144/93 mmHg Patient Gender: M              HR:           83 bpm. Exam Location:  Inpatient Procedure: 2D Echo, Cardiac Doppler and Color Doppler Indications:    Chest pain  History:         Patient has prior history of Echocardiogram examinations, most                 recent 07/28/2021. CAD, Arrythmias:Atrial Fibrillation; Risk                 Factors:Hypertension and Diabetes.  Sonographer:    Jefferey Pica Referring Phys: [place]  1. Left ventricular ejection fraction, by estimation, is 55 to 60%. The left ventricle has normal function. The left ventricle has no regional wall motion abnormalities. There is moderate left ventricular hypertrophy. Left ventricular diastolic parameters are consistent with Grade II diastolic dysfunction (pseudonormalization).  2. Right ventricular systolic function is normal. The right ventricular size is normal.  3. Left atrial size was mildly dilated.  4. The mitral valve is normal in structure. No evidence of mitral valve regurgitation. No evidence of mitral stenosis.  5. The aortic valve is tricuspid. There is mild calcification of the aortic valve. There is mild thickening of the aortic valve. Aortic valve regurgitation is not visualized. Aortic valve sclerosis is present, with no evidence of aortic valve stenosis.  6. The inferior vena cava is normal in size with greater than 50% respiratory variability, suggesting right atrial pressure of 3 mmHg. Comparison(s): Prior images reviewed side by side. The left ventricular function has improved. FINDINGS  Left Ventricle: Left ventricular ejection fraction, by estimation, is 55 to 60%. The left ventricle has normal function. The left ventricle has no regional wall motion abnormalities. The left ventricular internal cavity size was normal in size. There is  moderate left ventricular hypertrophy. Left ventricular diastolic parameters are consistent with Grade II diastolic dysfunction (pseudonormalization). Right Ventricle: The right ventricular size is normal. No increase in right ventricular wall thickness. Right ventricular systolic function is normal. Left Atrium: Left atrial size was  mildly dilated. Right Atrium: Right atrial size was normal in size. Pericardium: There is no evidence of pericardial effusion. Mitral Valve: The mitral valve is normal in structure. Mild mitral annular calcification. No evidence of mitral valve regurgitation. No evidence of mitral valve stenosis. Tricuspid Valve: The tricuspid valve is normal in structure. Tricuspid valve regurgitation is not demonstrated. No evidence of tricuspid stenosis. Aortic Valve: The aortic valve is tricuspid. There is mild calcification of the aortic valve. There is mild thickening of the aortic valve. Aortic valve  regurgitation is not visualized. Aortic valve sclerosis is present, with no evidence of aortic valve stenosis. Aortic valve peak gradient measures 7.6 mmHg. Pulmonic Valve: The pulmonic valve was normal in structure. Pulmonic valve regurgitation is not visualized. No evidence of pulmonic stenosis. Aorta: The aortic root is normal in size and structure. Venous: The inferior vena cava is normal in size with greater than 50% respiratory variability, suggesting right atrial pressure of 3 mmHg. IAS/Shunts: No atrial level shunt detected by color flow Doppler.  LEFT VENTRICLE PLAX 2D LVIDd:         4.60 cm   Diastology LVIDs:         3.30 cm   LV e' medial:    3.60 cm/s LV PW:         1.10 cm   LV E/e' medial:  24.4 LV IVS:        3.15 cm   LV e' lateral:   8.78 cm/s LVOT diam:     2.10 cm   LV E/e' lateral: 10.0 LV SV:         68 LV SV Index:   29 LVOT Area:     3.46 cm?  RIGHT VENTRICLE            IVC RV Basal diam:  3.60 cm    IVC diam: 1.70 cm RV S prime:     9.68 cm/s LEFT ATRIUM             Index        RIGHT ATRIUM           Index LA diam:        4.50 cm 1.91 cm/m?   RA Area:     18.10 cm? LA Vol (A2C):   76.9 ml 32.63 ml/m?  RA Volume:   43.60 ml  18.50 ml/m? LA Vol (A4C):   89.1 ml 37.80 ml/m? LA Biplane Vol: 89.0 ml 37.76 ml/m?  AORTIC VALVE                 PULMONIC VALVE AV Area (Vmax): 2.75 cm?     PV Vmax:       0.69 m/s  AV Vmax:        137.50 cm/s  PV Peak grad:  1.9 mmHg AV Peak Grad:   7.6 mmHg LVOT Vmax:      109.00 cm/s LVOT Vmean:     62.500 cm/s LVOT VTI:       0.195 m  AORTA Ao Root diam: 3.65 cm MITRAL VALVE MV Area (PHT): 3.42 cm

## 2022-01-20 NOTE — Discharge Summary (Signed)
Physician Discharge Summary  ?ABISAI DEER TIW:[id number] DOB: 1966/04/05 ? ?PCP: Reynold Bowen, MD ? ?Admitted from: Home ?Discharged to: Home ? ?Admit date: 01/18/2022 ?Discharge date: 01/20/2022 ? ?Recommendations for Outpatient Follow-up:  ? ? Follow-up Information   ? ? [facility] Follow up on 02/03/2022.   ?Specialty: Cardiology ?Why: at 9:30 ?Contact information: ?[address] ?[id number] ?[place] ?[phone] ? ?  ?  ? ? Reynold Bowen, MD. Schedule an appointment as soon as possible for a visit in 1 week(s).   ?Specialty: Endocrinology ?Why: To be seen with repeat labs (CBC & BMP). ?Contact information: ?[address] ?[place] [address] ?[phone] ? ? ?  ?  ? ? Skeet Latch, MD .   ?Specialty: Cardiology ?Contact information: ?[address] ?[address] ?[place] [address] ?[phone] ? ? ?  ?  ? ?  ?  ? ?  ? ? ? ?Home Health: None ?  ? ?Equipment/Devices: None ?  ? ?Discharge Condition: Improved and stable. ?  Code Status: Full Code ?Diet recommendation:  ?Discharge Diet Orders (From admission, onward)  ? ?  Start     Ordered  ? 01/20/22 0000  Diet - low sodium heart healthy       ? 01/20/22 0837  ? 01/20/22 0000  Diet Carb Modified       ? 01/20/22 0837  ? ?  ?  ? ?  ?  ? ?Discharge Diagnoses:  ?Principal Problem: ?  Viral bronchitis ?Active Problems: ?  Essential hypertension ?  Acute renal failure superimposed on stage 3b chronic kidney disease ([place]) ?  Atrial fibrillation ([place]) ?  S/P CABG x 4 ?  AKI (acute kidney injury) ([place]) ?  Chronic combined systolic and diastolic heart failure ([place]) ?  Hyperlipidemia ?  Type 2 diabetes mellitus with proliferative retinopathy without macular edema (HCC) ? ? ?Brief Summary: ?56 year old male, lives alone, independent, medical history significant for CAD s/p DES with subsequent CABG times 410/25/22, persistent A-fib, s/p LAA clipping, chronic systolic CHF, HTN, DM, CKD  stage III, SVT s/p ablation, anxiety and depression, presented to ED with 3 to 4 days history of nonproductive cough, head and chest congestion, chest pressure, chills and rigors, poor oral intake and eventually got dizzy and decided to call EMS.  Per report, SBP in the 90s and oxygen saturation in the 70s by EMS.  Initially brought to the ED as a code STEMI, cardiology team saw him on arrival (as per EDP note), EKG similar to previous, no chest pain and code STEMI was canceled.  Admitted for acute viral bronchitis, acute respiratory failure with hypoxia and acute kidney injury complicating stage IIIb disease.  Advanced HF Cardiology consulted. ?  ?  ?Assessment & Plan:  ? ?Acute viral bronchitis: Flu and COVID RT-PCR negative.  RVP positive for parainfluenza virus 3+.  Blood cultures x2: NTD.  Chest x-ray x2 and CT chest abdomen and pelvis: No pneumonia or acute findings.  Pro calcitonin negative.  Empirically started antibiotics for pneumonia discontinued.  Treated supportively with improvement.  Not hypoxic at discharge. ? ?Acute respiratory failure with hypoxia: As per report, was hypoxic to 70% with EMS.  Also note of hypoxia with sleep.  May have OSA and will need to be tested as outpatient.  Not hypoxic on room air at rest or while ambulating. ? ?Acute kidney injury complicating stage IIIb CKD: Creatinine elevated from 1.91 at recent baseline to 3.01.  Suspected  due to poor oral intake, dehydration, some ibuprofen use at home and Lasix at home.  Hold culprit medications.  Brief and gentle IV fluids.  Creatinine down to 2.75 > 2.35.  As per cardiology, DC Lasix at discharge and creatinine should continue to trend down to his baseline.  Follow-up BMP closely as outpatient. ? ?Essential hypertension: Reportedly SBP with 90s by EMS.  Continue carvedilol.  Blood pressures improved.  Resumed hydralazine. ? ?Type II DM with renal complications: Continue home regimen. ? ?Hyperlipidemia: Continue statins. ?  ?CAD, s/p  MI, s/p CABG: Chest pain atypical and is musculoskeletal/pleuritic in nature due to acute bronchitis.  High-sensitivity troponins, mildly elevated and not consistent with ACS.  No aspirin due to being on Plavix and Eliquis, continue.  Continue atorvastatin and carvedilol. ?  ?Chronic systolic CHF/ischemic cardiomyopathy: Echo 07/28/2021: LVEF 40-45%.  Clinically not volume overloaded.  Reds clip 33.  Holding Farxiga and diuretics while in the hospital.  Cardiology input appreciated.  Repeat echo results as below, normal EF, recovered.  At discharge, cardiology recommends resuming Farxiga but discontinuing Lasix. ? ?Persistent A-fib: S/p maze and LAA clipping.  Maintaining sinus rhythm.  Continue beta-blockers and Eliquis. ?  ?Thrombocytopenia: May be due to acute viral illness.  Follow CBC closely as outpatient.. ? ?Body mass index is 31.07 kg/m?. ?  ?  ?  ?Consultants:   ?Cardiology ?  ?Procedures:   ?  ? ? ?Discharge Instructions ? ?Discharge Instructions   ? ? (HEART FAILURE PATIENTS) Call MD:  Anytime you have any of the following symptoms: 1) 3 pound weight gain in 24 hours or 5 pounds in 1 week 2) shortness of breath, with or without a dry hacking cough 3) swelling in the hands, feet or stomach 4) if you have to sleep on extra pillows at night in order to breathe.   Complete by: As directed ?  ? Call MD for:  difficulty breathing, headache or visual disturbances   Complete by: As directed ?  ? Call MD for:  extreme fatigue   Complete by: As directed ?  ? Call MD for:  persistant dizziness or light-headedness   Complete by: As directed ?  ? Call MD for:  persistant nausea and vomiting   Complete by: As directed ?  ? Call MD for:  severe uncontrolled pain   Complete by: As directed ?  ? Call MD for:  temperature >100.4   Complete by: As directed ?  ? Diet - low sodium heart healthy   Complete by: As directed ?  ? Diet Carb Modified   Complete by: As directed ?  ? Increase activity slowly   Complete by: As  directed ?  ? ?  ? ?  ?Medication List  ?  ? ?STOP taking these medications   ? ?furosemide 20 MG tablet ?Commonly known as: LASIX ?  ?MSM PO ?  ?OVER THE COUNTER MEDICATION ?  ?oxyCODONE 5 MG immediate release tablet ?Commonly known as: Oxy IR/ROXICODONE ?  ? ?  ? ?TAKE these medications   ? ?alprazolam 2 MG tablet ?Commonly known as: Duanne Moron ?Take 1 tablet (2 mg total) by mouth 3 (three) times daily as needed for anxiety. ?  ?apixaban 5 MG Tabs tablet ?Commonly known as: ELIQUIS ?Take 1 tablet (5 mg total) by mouth 2 (two) times daily. ?  ?atorvastatin 80 MG tablet ?Commonly known as: LIPITOR ?Take 1 tablet (80 mg total) by mouth daily. ?  ?BD Pen Needle Nano 2nd Gen 32G  X 4 MM Misc ?Generic drug: Insulin Pen Needle ?USE WITH TRESIBA DAILY E11.39 ?  ?benzonatate 200 MG capsule ?Commonly known as: TESSALON ?Take 1 capsule (200 mg total) by mouth 3 (three) times daily. ?  ?carvedilol 6.25 MG tablet ?Commonly known as: COREG ?Take 1 tablet (6.25 mg total) by mouth 2 (two) times daily with a meal. ?  ?clopidogrel 75 MG tablet ?Commonly known as: PLAVIX ?Take 1 tablet (75 mg total) by mouth daily. ?  ?dapagliflozin propanediol 10 MG Tabs tablet ?Commonly known as: [place] ?Take 1 tablet (10 mg total) by mouth daily before breakfast. ?  ?diclofenac Sodium 1 % Gel ?Commonly known as: VOLTAREN ?Apply 1 application. topically 4 (four) times daily as needed for pain. ?  ?freestyle lancets ?CHECK BLOOD SUGAR 3 TIMES DAILY ?  ?FreeStyle Libre 14 Day Sensor Misc ?Use to check CBG three times daily.  Change sensore every 14 days. DX:  E11.319 ?  ?guaiFENesin-dextromethorphan 100-10 MG/5ML syrup ?Commonly known as: ROBITUSSIN DM ?Take 10 mLs by mouth every 4 (four) hours as needed for cough. ?  ?hydrALAZINE 25 MG tablet ?Commonly known as: APRESOLINE ?Take 0.5 tablets (12.5 mg total) by mouth in the morning and at bedtime. ?  ?MAGNESIUM CITRATE PO ?Take 1 tablet by mouth daily. ?  ?meclizine 12.5 MG tablet ?Commonly known as:  ANTIVERT ?Take 1 tablet (12.5 mg total) by mouth 2 (two) times daily as needed for dizziness. ?  ?methocarbamol 500 MG tablet ?Commonly known as: ROBAXIN ?Take 500 mg by mouth 3 (three) times daily as needed for

## 2022-01-20 NOTE — TOC Initial Note (Addendum)
Transition of Care (TOC) - Initial/Assessment Note  ? ? ?Patient Details  ?Name: Billy [name] ?MRN: [id number] ?Date of Birth: 08-27-1966 ? ?Transition of Care [facility]) CM/SW Contact:    ?Billy Grammes Rexene Alberts, RN ?Phone Number: [phone] ?01/20/2022, 10:13 AM ? ?Clinical Narrative:                 ?HF TOC CM spoke to pt at bedside. Offered transportation home. States his sister has key to his home. Pt states his sister assist him as needed.  ? ?Expected Discharge Plan: Home/Self Care ?Barriers to Discharge: No Barriers Identified ? ? ?Patient Goals and CMS Choice ?  ?  ?  ? ?Expected Discharge Plan and Services ?Expected Discharge Plan: Home/Self Care ?  ?Discharge Planning Services: CM Consult ?  ?  ?Expected Discharge Date: 01/20/22               ?  ?  ?  ?  ?  ?  ?  ?  ?  ?  ? ?Prior Living Arrangements/Services ?  ?Lives with:: Self ?Patient language and need for interpreter reviewed:: Yes ?Do you feel safe going back to the place where you live?: Yes      ?Need for Family Participation in Patient Care: No (Comment) ?Care giver support system in place?: No (comment) ?Current home services: DME (cane) ?Criminal Activity/Legal Involvement Pertinent to Current Situation/Hospitalization: No - Comment as needed ? ?Activities of Daily Living ?Home Assistive Devices/Equipment: None ?ADL Screening (condition at time of admission) ?Patient's cognitive ability adequate to safely complete daily activities?: Yes ?Is the patient deaf or have difficulty hearing?: No ?Does the patient have difficulty seeing, even when wearing glasses/contacts?: No ?Does the patient have difficulty concentrating, remembering, or making decisions?: No ?Patient able to express need for assistance with ADLs?: Yes ?Does the patient have difficulty dressing or bathing?: No ?Independently performs ADLs?: Yes (appropriate for developmental age) ?Does the patient have difficulty walking or climbing stairs?: No ?Weakness of Legs: None ?Weakness of  Arms/Hands: None ? ?Permission Sought/Granted ?Permission sought to share information with : Case Manager, Family Supports, PCP ?Permission granted to share information with : Yes, Verbal Permission Granted ? Share Information with NAME: Billy [name] ?   ? Permission granted to share info w Relationship: sister ? Permission granted to share info w Contact Information: [phone] ? ?Emotional Assessment ?Appearance:: Appears stated age ?Attitude/Demeanor/Rapport: Gracious ?Affect (typically observed): Accepting ?Orientation: : Oriented to Self, Oriented to Place, Oriented to  Time, Oriented to Situation ?  ?Psych Involvement: No (comment) ? ?Admission diagnosis:  Generalized weakness [R53.1] ?AKI (acute kidney injury) ([place]) [N17.9] ?Acute cough [R05.1] ?Patient Active Problem List  ? Diagnosis Date Noted  ? Viral bronchitis 01/18/2022  ? Acute renal failure superimposed on stage 3b chronic kidney disease ([place]) 01/18/2022  ? Acute tubular necrosis ([place]) 09/30/2021  ? Atherosclerotic heart disease of native coronary artery without angina pectoris 09/30/2021  ? Benign neoplasm of colon 09/30/2021  ? Chronic kidney disease due to hypertension 09/30/2021  ? Chronic kidney disease, stage 3b ([place]) 09/30/2021  ? Fatty liver 09/30/2021  ? Ischemic cardiomyopathy 09/30/2021  ? Chronic combined systolic and diastolic heart failure ([place]) 09/22/2021  ? Pressure injury of skin 07/18/2021  ? AKI (acute kidney injury) ([place])   ? Acute respiratory failure with hypoxia ([place])   ? S/P CABG x 4 07/08/2021  ? STEMI (ST elevation myocardial infarction) ([place]) 07/07/2021  ? Exertional dyspnea 06/30/2021  ? Atrial fibrillation ([place]) 06/30/2021  ?  Chest pain 06/25/2021  ? Renal insufficiency 06/25/2021  ? Encounter for general adult medical examination without abnormal findings 02/03/2019  ? Obesity 02/03/2019  ? Long term (current) use of insulin ([place]) 08/12/2017  ? Absence of toe ([place]) 07/23/2017  ? S/P amputation of lesser toe, right ([place])  07/13/2017  ? Chronic osteomyelitis of toe, right ([place])   ? Left shoulder pain 06/30/2017  ? Marijuana dependence ([place]) 06/30/2017  ? Benign prostatic hyperplasia with lower urinary tract symptoms 03/02/2017  ? Diabetes mellitus with ulcer of toe ([place]) 06/05/2012  ?  Class: Acute  ? Anxiety disorder 09/23/2011  ? Primary gout 07/01/2011  ? Essential hypertension 02/26/2011  ?  Class: Chronic  ? Hyperlipidemia 04/24/2010  ? Insomnia 04/24/2010  ? Type 2 diabetes mellitus with proliferative retinopathy without macular edema (HCC) 03/21/2010  ? Vitreous hemorrhage ([place]) 03/21/2010  ? ?PCP:  Reynold Bowen, MD ?Pharmacy:   ?[id number] [place] [place] ?[address] [id number]?Phone: [phone]: [phone]? ?[id number] [id number]: [phone]: [phone] ? ? ? ?Social Determinants of Health (SDOH) Interventions ?  ? ?Readmission Risk Interventions ?   ? View : No data to display.  ?  ?  ?  ? ? ? ?

## 2022-01-20 NOTE — Progress Notes (Signed)
Order to discharge pt home.  Per pt, his sister will transport him home.  At this time, his sister will not be available to transport the pt home until late this afternoon/evening.  Pt on infections precautions.  Will continue to monitor.   ?

## 2022-01-20 NOTE — Progress Notes (Signed)
Order to discharge pt home.  Discharge instructions/AVS given to patient and reviewed - education provided as needed.  Pt advised to call PCP and/or come back to the hospital if there are any problems. Pt verbalized understanding.    

## 2022-01-20 NOTE — Progress Notes (Addendum)
SATURATION QUALIFICATIONS:  ? ?Patient Saturations on Room Air at Rest = 96% ? ?Patient Saturations on Room Air while Ambulating = 94% ? ? ?

## 2022-01-20 NOTE — Plan of Care (Signed)

## 2022-01-21 ENCOUNTER — Encounter (HOSPITAL_COMMUNITY): Payer: Medicare Other

## 2022-01-22 ENCOUNTER — Other Ambulatory Visit: Payer: Self-pay | Admitting: Thoracic Surgery (Cardiothoracic Vascular Surgery)

## 2022-01-22 DIAGNOSIS — Z951 Presence of aortocoronary bypass graft: Secondary | ICD-10-CM

## 2022-01-23 ENCOUNTER — Ambulatory Visit
Admission: RE | Admit: 2022-01-23 | Discharge: 2022-01-23 | Disposition: A | Discharge status: Home / Self Care | Payer: Medicare Other | Source: Ambulatory Visit | Attending: Thoracic Surgery (Cardiothoracic Vascular Surgery) | Admitting: Thoracic Surgery (Cardiothoracic Vascular Surgery)

## 2022-01-23 ENCOUNTER — Encounter (HOSPITAL_COMMUNITY): Payer: Medicare Other

## 2022-01-23 ENCOUNTER — Other Ambulatory Visit: Payer: Self-pay | Admitting: Thoracic Surgery (Cardiothoracic Vascular Surgery)

## 2022-01-23 ENCOUNTER — Ambulatory Visit: Payer: Medicare Other | Admitting: Thoracic Surgery (Cardiothoracic Vascular Surgery)

## 2022-01-23 DIAGNOSIS — Z951 Presence of aortocoronary bypass graft: Secondary | ICD-10-CM

## 2022-01-23 LAB — CULTURE, BLOOD (ROUTINE X 2)
Culture: NO GROWTH
Culture: NO GROWTH
Special Requests: ADEQUATE
Special Requests: ADEQUATE

## 2022-01-26 ENCOUNTER — Encounter (HOSPITAL_COMMUNITY): Payer: Medicare Other

## 2022-01-26 ENCOUNTER — Ambulatory Visit (HOSPITAL_BASED_OUTPATIENT_CLINIC_OR_DEPARTMENT_OTHER): Payer: Medicare HMO | Admitting: Cardiovascular Disease

## 2022-01-26 ENCOUNTER — Telehealth (HOSPITAL_COMMUNITY): Payer: Self-pay

## 2022-01-26 ENCOUNTER — Telehealth (HOSPITAL_COMMUNITY): Payer: Self-pay | Admitting: *Deleted

## 2022-01-26 ENCOUNTER — Telehealth: Payer: Self-pay | Admitting: Cardiovascular Disease

## 2022-01-26 NOTE — Telephone Encounter (Signed)
Printed and given to Dr. Oval Linsey and Alvina Filbert in clinic, also routed to their box.  ?

## 2022-01-26 NOTE — Telephone Encounter (Signed)
Pt states that he needs something stating that it is okay for him to return to the [facility]. Pt says that they will not let him return until he has something in his chart or in writing being that he had to go to the ER. Please advise ?

## 2022-01-26 NOTE — Telephone Encounter (Signed)
Pt called back to ask why he could not return to exercise.  Advised pt that recommendation given at the ER stated that he needed to complete follow up appt.  Billy [name] has a follow up appt on 5/23 with the HF clinic.  This appt was scheduled on 5/9 ( day after his discharge) EKG and lab work are indicated on the appt notes.  Noted that he did have an appt today 5/15 with Dr. Oval Linsey that was cancelled by him via mychart - no reason given. Reiterated with pt that we do not have any documentation indicating ok for him to return prior to completing an in person visit. Advised him that he can contact his MD office and ask for clearance to return to exercise post ER Visit prior to his scheduled appt on 5/23.  Verbalized understanding however remains frustrated. Cherre Huger, BSN ?Cardiac and Pulmonary Rehab Nurse Navigator  ? ?

## 2022-01-26 NOTE — Telephone Encounter (Signed)
Pt called in regards to his CR appt for 5/15 being canceled. Adv pt reason appt was canceled due to, "(In ED, has the flu)". Adv pt per RN Verdis Frederickson he has to complete his f/u appt on 5/23 before he can return to CR. Pt did not understand why, adv pt to contact dr office to see if he can get a sooner appt. ?

## 2022-01-27 ENCOUNTER — Telehealth (HOSPITAL_COMMUNITY): Payer: Self-pay

## 2022-01-27 NOTE — Telephone Encounter (Signed)
Pt called and stated Dr. Oval Linsey office sent a letter over stating it is ok for him to return to CR tomorrow. Adv pt I did not see anything in his chart. He stated he will follow back up with the doctor office. ?

## 2022-01-28 ENCOUNTER — Encounter (HOSPITAL_BASED_OUTPATIENT_CLINIC_OR_DEPARTMENT_OTHER): Payer: Self-pay | Admitting: Cardiovascular Disease

## 2022-01-28 ENCOUNTER — Encounter (HOSPITAL_COMMUNITY): Payer: Medicare Other

## 2022-01-28 ENCOUNTER — Telehealth (HOSPITAL_COMMUNITY): Payer: Self-pay | Admitting: *Deleted

## 2022-01-28 NOTE — Telephone Encounter (Signed)
Spoke with Billy [name] I told him his exercise is on hold until he has been cleared by Dr Oval Linsey to return to exercise. Billy [name] has a post hospital follow up on 02/03/22. Patient states understanding.Harrell Gave RN BSN  ?

## 2022-01-30 ENCOUNTER — Encounter (HOSPITAL_COMMUNITY): Payer: Medicare Other

## 2022-02-02 ENCOUNTER — Encounter (HOSPITAL_BASED_OUTPATIENT_CLINIC_OR_DEPARTMENT_OTHER): Payer: Self-pay

## 2022-02-02 ENCOUNTER — Telehealth (HOSPITAL_COMMUNITY): Payer: Self-pay

## 2022-02-02 ENCOUNTER — Encounter (HOSPITAL_COMMUNITY): Payer: Medicare Other

## 2022-02-02 NOTE — Telephone Encounter (Signed)
Per Dr. Oval Linsey,    [place] for him to return to rehab.   Tiffany C. Oval Linsey, MD, [facility]   Will write patient a letter and mail or let him know that he can pick up, will also place in [place] message for patient.

## 2022-02-02 NOTE — Telephone Encounter (Signed)
Called and left patient a voice message to confirm/remind patient of their appointment at the [facility] on 02/03/22.   In addition, also asked to bring all medications and/or complete list.  .

## 2022-02-03 ENCOUNTER — Ambulatory Visit (HOSPITAL_COMMUNITY)
Admit: 2022-02-03 | Discharge: 2022-02-03 | Disposition: A | Discharge status: Home / Self Care | Payer: Medicare Other | Attending: Family Medicine | Admitting: Family Medicine

## 2022-02-03 ENCOUNTER — Encounter (HOSPITAL_COMMUNITY): Payer: Self-pay

## 2022-02-03 VITALS — BP 154/98 | HR 67 | Wt 246.0 lb

## 2022-02-03 DIAGNOSIS — I471 Supraventricular tachycardia: Secondary | ICD-10-CM | POA: Diagnosis not present

## 2022-02-03 DIAGNOSIS — F419 Anxiety disorder, unspecified: Secondary | ICD-10-CM | POA: Insufficient documentation

## 2022-02-03 DIAGNOSIS — Z955 Presence of coronary angioplasty implant and graft: Secondary | ICD-10-CM | POA: Diagnosis not present

## 2022-02-03 DIAGNOSIS — F32A Depression, unspecified: Secondary | ICD-10-CM | POA: Insufficient documentation

## 2022-02-03 DIAGNOSIS — E1122 Type 2 diabetes mellitus with diabetic chronic kidney disease: Secondary | ICD-10-CM | POA: Diagnosis not present

## 2022-02-03 DIAGNOSIS — I130 Hypertensive heart and chronic kidney disease with heart failure and stage 1 through stage 4 chronic kidney disease, or unspecified chronic kidney disease: Secondary | ICD-10-CM | POA: Diagnosis not present

## 2022-02-03 DIAGNOSIS — Z79899 Other long term (current) drug therapy: Secondary | ICD-10-CM | POA: Insufficient documentation

## 2022-02-03 DIAGNOSIS — Z2821 Immunization not carried out because of patient refusal: Secondary | ICD-10-CM

## 2022-02-03 DIAGNOSIS — Z7982 Long term (current) use of aspirin: Secondary | ICD-10-CM | POA: Diagnosis not present

## 2022-02-03 DIAGNOSIS — I4891 Unspecified atrial fibrillation: Secondary | ICD-10-CM

## 2022-02-03 DIAGNOSIS — I2510 Atherosclerotic heart disease of native coronary artery without angina pectoris: Secondary | ICD-10-CM | POA: Diagnosis present

## 2022-02-03 DIAGNOSIS — Z7901 Long term (current) use of anticoagulants: Secondary | ICD-10-CM | POA: Diagnosis not present

## 2022-02-03 DIAGNOSIS — Z7983 Long term (current) use of bisphosphonates: Secondary | ICD-10-CM | POA: Insufficient documentation

## 2022-02-03 DIAGNOSIS — I480 Paroxysmal atrial fibrillation: Secondary | ICD-10-CM | POA: Insufficient documentation

## 2022-02-03 DIAGNOSIS — Z7902 Long term (current) use of antithrombotics/antiplatelets: Secondary | ICD-10-CM | POA: Insufficient documentation

## 2022-02-03 DIAGNOSIS — I252 Old myocardial infarction: Secondary | ICD-10-CM | POA: Diagnosis not present

## 2022-02-03 DIAGNOSIS — I5022 Chronic systolic (congestive) heart failure: Secondary | ICD-10-CM | POA: Diagnosis not present

## 2022-02-03 DIAGNOSIS — I5042 Chronic combined systolic (congestive) and diastolic (congestive) heart failure: Secondary | ICD-10-CM

## 2022-02-03 DIAGNOSIS — N183 Chronic kidney disease, stage 3 unspecified: Secondary | ICD-10-CM | POA: Diagnosis not present

## 2022-02-03 DIAGNOSIS — I160 Hypertensive urgency: Secondary | ICD-10-CM | POA: Insufficient documentation

## 2022-02-03 DIAGNOSIS — R079 Chest pain, unspecified: Secondary | ICD-10-CM | POA: Insufficient documentation

## 2022-02-03 DIAGNOSIS — N1832 Chronic kidney disease, stage 3b: Secondary | ICD-10-CM | POA: Diagnosis not present

## 2022-02-03 DIAGNOSIS — Z7984 Long term (current) use of oral hypoglycemic drugs: Secondary | ICD-10-CM | POA: Diagnosis not present

## 2022-02-03 DIAGNOSIS — Z951 Presence of aortocoronary bypass graft: Secondary | ICD-10-CM | POA: Diagnosis not present

## 2022-02-03 DIAGNOSIS — N179 Acute kidney failure, unspecified: Secondary | ICD-10-CM | POA: Diagnosis not present

## 2022-02-03 DIAGNOSIS — Z2881 Immunization not carried out due to patient having had the disease: Secondary | ICD-10-CM

## 2022-02-03 LAB — BASIC METABOLIC PANEL
Anion gap: 5 (ref 5–15)
BUN: 23 mg/dL — ABNORMAL HIGH (ref 6–20)
CO2: 30 mmol/L (ref 22–32)
Calcium: 9 mg/dL (ref 8.9–10.3)
Chloride: 105 mmol/L (ref 98–111)
Creatinine, Ser: 2.12 mg/dL — ABNORMAL HIGH (ref 0.61–1.24)
GFR, Estimated: 36 mL/min — ABNORMAL LOW (ref >60)
Glucose, Bld: 157 mg/dL — ABNORMAL HIGH (ref 70–99)
Potassium: 4.7 mmol/L (ref 3.5–5.1)
Sodium: 140 mmol/L (ref 135–145)

## 2022-02-03 LAB — BRAIN NATRIURETIC PEPTIDE: B Natriuretic Peptide: 462.8 pg/mL — ABNORMAL HIGH (ref 0.0–100.0)

## 2022-02-03 MED ORDER — FUROSEMIDE 20 MG PO TABS: 20.0000 mg | ORAL_TABLET | Freq: Every day | ORAL | 11 refills | Status: DC | PRN

## 2022-02-03 NOTE — Progress Notes (Signed)
ADVANCED HF CLINIC NOTE   Primary Care: Reynold Bowen, MD Primary Cardiologist: Dr. Oval Linsey Nephrologist: Dr. Moshe Cipro HF Cardiologist: Dr. Aundra Dubin  HPI: Mr Bozard is a 56 y.o. with a history of visually impaired, DM, HTN, depression, anxiety, SVT, CKD Stage II, PAF, CAD and chronic systolic heart failure.    Admitted 06/24/21 with chest pain and hypertensive urgency. Cardiology consulted. Echo completed EF 50-55%. Had brief run of A fib with EMS but was in [place] on arrival.  Elwyn Reach was recommended. Discharged to home 06/25/21.    Saw Dr Oval Linsey on 06/30/21. He was supposed to wear Zio patch for 7 days then he was to start carvedilol 12.5 mg twice a day. During the visit he wanted to know what he could do without medications.    Admitted with STEMI 07/07/21. Underwent LHC with 3V CAD-->PCI of culprit vessel followed by S/P CABG x4  radial harvest, and maze. Intraoperative EF 20%. Hospitalization c/b recurrent atrial fibrillation requiring IV amiodarone, AKI on CKD IIIb requiring CVVHD and iHD, presumably ischemic ATN in setting of hemodynamic instability post CABG +/- cholesterol emboli.  Repeat Echo with EF 40-45%, mild LVH, mildly decreased RV systolic function, normal IVC. Tunneled HD cath removed and Scr down to 2.74, baseline 1.9 prior to CABG. Discharged to SNF.  Admitted 5/23 with influenza, found to have AKI and ECG changes. Initially concerned for STEMI, Cardiology consulted and STEMI ruled out. Echo showed EF 55-60%, grade II DD. GDMT held and gently rehydrated. Farxiga, hydralazine and Coreg resumed; lasix stopped.  Discharged home, weight 241 lbs.  Today he returns for post hospital HF follow up. Overall feeling fine. He is exercising on his bike. No dyspnea with this. Denies palpitations, CP, dizziness, edema, or PND/Orthopnea. Appetite ok. No fever or chills. Weight at home  242 pounds. Taking all medications. Has continued taking Lasix 30 mg nightly. No tobacco use, occasional  THC use.  ReDs: 41%  ECG (personally reviewed): NSR 64 bpm (personally reviewed)  Labs (1/23): K 4.7, creatinine 2.17 Labs (5/23): K 4.5, creatinine 2.35  Past Medical History:  Diagnosis Date   Anxiety    Chronic combined systolic and diastolic heart failure ([place]) 09/22/2021   Depression    Diabetic neuropathy (HCC)    Diabetic retinopathy ([place])    visual impairment   Dysrhythmia    "palpatations sometimes" (06/30/2017)   Exertional dyspnea 06/30/2021   Family history of adverse reaction to anesthesia    sister had OR 2016; "couldn't wake up; could hear what they were saying but couldn't get their attention; like I was paralyzed but fully awake" (06/30/2017)   Pneumonia X 2   SVT (supraventricular tachycardia) ([place])    Toe ulcer ([place]) 06/30/2017   2nd digit   Type II diabetes mellitus ([place])    Current Outpatient Medications  Medication Sig Dispense Refill   alprazolam (XANAX) 2 MG tablet Take 1 tablet (2 mg total) by mouth 3 (three) times daily as needed for anxiety. 2 tablet 0   apixaban (ELIQUIS) 5 MG TABS tablet Take 1 tablet (5 mg total) by mouth 2 (two) times daily. 90 tablet 3   atorvastatin (LIPITOR) 80 MG tablet Take 1 tablet (80 mg total) by mouth daily. 30 tablet 1   carvedilol (COREG) 6.25 MG tablet Take 1 tablet (6.25 mg total) by mouth 2 (two) times daily with a meal. 180 tablet 3   Cholecalciferol (VITAMIN D) 50 MCG (2000 UT) tablet Take 2,000 Units by mouth daily.  clopidogrel (PLAVIX) 75 MG tablet Take 1 tablet (75 mg total) by mouth daily. 90 tablet 3   Continuous Blood Gluc Sensor (FREESTYLE LIBRE 14 DAY SENSOR) MISC Use to check CBG three times daily.  Change sensore every 14 days. DX:  E11.319     dapagliflozin propanediol (FARXIGA) 10 MG TABS tablet Take 1 tablet (10 mg total) by mouth daily before breakfast. 90 tablet 1   diclofenac Sodium (VOLTAREN) 1 % GEL Apply 1 application. topically 4 (four) times daily as needed for pain.      guaiFENesin-dextromethorphan (ROBITUSSIN DM) 100-10 MG/5ML syrup Take 10 mLs by mouth every 4 (four) hours as needed for cough. 118 mL 0   hydrALAZINE (APRESOLINE) 25 MG tablet Take 0.5 tablets (12.5 mg total) by mouth in the morning and at bedtime. 90 tablet 3   Insulin Pen Needle (BD PEN NEEDLE NANO 2ND GEN) 32G X 4 MM MISC USE WITH TRESIBA DAILY E11.39     Lancets (FREESTYLE) lancets CHECK BLOOD SUGAR 3 TIMES DAILY  6   MAGNESIUM CITRATE PO Take 1 tablet by mouth daily.     meclizine (ANTIVERT) 12.5 MG tablet Take 1 tablet (12.5 mg total) by mouth 2 (two) times daily as needed for dizziness. 30 tablet 0   methocarbamol (ROBAXIN) 500 MG tablet Take 500 mg by mouth 3 (three) times daily as needed for muscle spasms.     OVER THE COUNTER MEDICATION Take 1 tablet by mouth daily as needed (energy). Super Beets supplement     traZODone (DESYREL) 100 MG tablet Take 100 mg by mouth at bedtime.     TRESIBA FLEXTOUCH 100 UNIT/ML FlexTouch Pen Inject 10-20 Units into the skin daily as needed (BS over 150).     zinc gluconate 50 MG tablet Take 50 mg by mouth daily.     No current facility-administered medications for this encounter.   Allergies  Allergen Reactions   Metformin Hcl     legs cramp   Social History   Socioeconomic History   Marital status: Legally Separated    Spouse name: Not on file   Number of children: Not on file   Years of education: Not on file   Highest education level: Not on file  Occupational History   Occupation: Loss adjuster, chartered  Tobacco Use   Smoking status: Never   Smokeless tobacco: Never  Vaping Use   Vaping Use: Never used  Substance and Sexual Activity   Alcohol use: Not Currently   Drug use: Not Currently    Types: Marijuana   Sexual activity: Not on file  Other Topics Concern   Not on file  Social History Narrative   Not on file   Social Determinants of Health   Financial Resource Strain: Low Risk    Difficulty of Paying Living  Expenses: Not hard at all  Food Insecurity: No Food Insecurity   Worried About Charity fundraiser in the Last Year: Never true   Arboriculturist in the Last Year: Never true  Transportation Needs: Unmet Transportation Needs   Lack of Transportation (Medical): Yes   Lack of Transportation (Non-Medical): Yes  Physical Activity: Insufficiently Active   Days of Exercise per Week: 5 days   Minutes of Exercise per Session: 20 min  Stress: No Stress Concern Present   Feeling of Stress : Only a little  Social Connections: Moderately Integrated   Frequency of Communication with Friends and Family: More than three times a week  Frequency of Social Gatherings with Friends and Family: More than three times a week   Attends Religious Services: More than 4 times per year   Active Member of [employer] or Organizations: Yes   Attends Music therapist: More than 4 times per year   Marital Status: Separated  Intimate Partner Violence: Not At Risk   Fear of Current or Ex-Partner: No   Emotionally Abused: No   Physically Abused: No   Sexually Abused: No   SH: Lives alone. Disabled. Visually impaired. Eats natural fertilizer daily. Has 2 sisters with 1 local and she drives him to appointments. His sisters tell me he hesitant about procedures and medications.    Family History  Problem Relation Age of Onset   Heart failure Mother    Diabetes Mother    Pancreatic cancer Father    Diabetes Father    Heart attack Maternal Grandmother    Heart attack Paternal Grandfather    Stroke Cousin    Heart attack Cousin    Colon cancer Neg Hx    BP (!) 152/94   Pulse 67   Wt 111.6 kg   SpO2 96%   BMI 31.58 kg/m   Wt Readings from Last 3 Encounters:  02/03/22 111.6 kg  01/18/22 109.8 kg  12/09/21 116.6 kg   PHYSICAL EXAM: General:  NAD. No resp difficulty HEENT: Normal Neck: Supple. No JVD. Carotids 2+ bilat; no bruits. No lymphadenopathy or thryomegaly appreciated. Cor: PMI nondisplaced.  Regular rate & rhythm. No rubs, gallops or murmurs. Lungs: Clear Abdomen: Soft, nontender, nondistended. No hepatosplenomegaly. No bruits or masses. Good bowel sounds. Extremities: No cyanosis, clubbing, rash, edema Neuro: Alert & oriented x 3, cranial nerves grossly intact. Moves all 4 extremities w/o difficulty. Affect pleasant.  ASSESSMENT & PLAN:  1. CAD: Admitted with anterolateral MI from occluded diagonal treated with DES in 10/22.  Patient then had CABG with LIMA-LAD, SVG-D, SVG-PDA, and left radial-OM for complete revascularization on 07/08/21.  No chest pain. - Now off ASA given need for Plavix and anticoagulation.  - Continue Plavix 75 daily at least 6 months and up to 1 year post-PCI.  - Continue atorvastatin 80 mg daily.  - OK to resume Cardiac Rehab. 2. Chronic systolic CHF: Ischemic cardiomyopathy.  Echo prior to 10/22 admission with EF 50-55% (pre-MI and CABG). Limited echo on 07/11/21 showed EF in 25% range. Intraoperative TEE with EF 20%.  Echo 07/28/21 showed EF improved to 40-45% with mildly decreased RV systolic function. Echo this admit (5/23) EF 55-60%, moderate LVH, grade II DD, RV normal. Stable NYHA II today, he is not volume overloaded today, REDs 41% (suspect due to recent influenza and muscular upper body).  - Change lasix to 20 mg PRN. - Continue Farxiga 10 mg daily. BMET/BNP today. - Continue hydralazine 12.5 mg tid. BP mildly elevated, he has not had AM meds yet. Consider increasing to 25 mg tid if remains elevated. - Continue Coreg 6.25 mg bid. - No ARNI/spironolactone with AKI on CKD.  3. Atrial fibrillation: Paroxysmal, s/p Maze and appendage clipping.  He has been maintaining NSR. - Continue Eliquis 5 mg bid. No  bleeding issues. 4. CKD stage 3: Required CVVHD then iHD during 10/22 admission but able to come off HD.  Most recent pre-hospitalization creatinine was 1.9.  Recent AKI. BMET today. 5. Vaccination: We discussed risks/benefits of vaccinations. I  encouraged him to get seasonal flu and Covid vaccinations.  Follow up with Dr. Aundra Dubin in 6 months.  Janett Billow  [place], FNP-BC 02/03/22

## 2022-02-03 NOTE — Progress Notes (Signed)
ReDS Vest / Clip - 02/03/22 0900       ReDS Vest / Clip   Station Marker D    Ruler Value 35    ReDS Value Range High volume overload    ReDS Actual Value 41

## 2022-02-03 NOTE — Patient Instructions (Signed)
CHANGE Lasix to 20 mg one tab daily as needed   Labs today We will only contact you if something comes back abnormal or we need to make some changes. Otherwise no news is good news!  Your physician recommends that you schedule a follow-up appointment in: 6 months with Dr Aundra Dubin  Do the following things EVERYDAY: Weigh yourself in the morning before breakfast. Write it down and keep it in a log. Take your medicines as prescribed Eat low salt foods--Limit salt (sodium) to 2000 mg per day.  Stay as active as you can everyday Limit all fluids for the day to less than 2 liters  At the [facility], you and your health needs are our priority. As part of our continuing mission to provide you with exceptional heart care, we have created designated Provider Care Teams. These Care Teams include your primary Cardiologist (physician) and Advanced Practice Providers (APPs- Physician Assistants and Nurse Practitioners) who all work together to provide you with the care you need, when you need it.   You may see any of the following providers on your designated Care Team at your next follow up: Dr Glori Bickers Dr Haynes Kerns, NP Lyda Jester, [place] [facility] [place], [place] Audry Riles, PharmD   Please be sure to bring in all your medications bottles to every appointment.   If you have any questions or concerns before your next appointment please send [place] a message through [place] or call our office at [phone].    TO LEAVE A MESSAGE FOR THE NURSE SELECT OPTION 2, PLEASE LEAVE A MESSAGE INCLUDING: YOUR NAME DATE OF BIRTH CALL BACK NUMBER REASON FOR CALL**this is important as we prioritize the call backs  YOU WILL RECEIVE A CALL BACK THE SAME DAY AS LONG AS YOU CALL BEFORE 4:00 PM

## 2022-02-04 ENCOUNTER — Telehealth (HOSPITAL_COMMUNITY): Payer: Self-pay | Admitting: *Deleted

## 2022-02-04 ENCOUNTER — Encounter (HOSPITAL_COMMUNITY): Payer: Medicare Other

## 2022-02-04 NOTE — Progress Notes (Signed)
Discharge Progress Report  Patient Details  Name: Billy [name] MRN: [id number] Date of Birth: 12-01-1965 Referring Provider:   Flowsheet Row CARDIAC REHAB PHASE II ORIENTATION from 12/09/2021 in [facility]  Referring Provider Skeet Latch, MD        Number of Visits: 13  Reason for Discharge:  Patient reached a stable level of exercise. Patient has met program and personal goals.  Smoking History:  Social History   Tobacco Use  Smoking Status Never  Smokeless Tobacco Never    Diagnosis:  07/07/21 STEMI  07/07/21 PCI/DES Diagonal  07/08/21 CABG x 4  ADL UCSD:   Initial Exercise Prescription:  Initial Exercise Prescription - 12/09/21 1100       Date of Initial Exercise RX and Referring Provider   Date 12/09/21    Referring Provider Skeet Latch, MD    Expected Discharge Date 02/06/22      Bike   Level 2    Minutes 15    METs 3.5      NuStep   Level 3    SPM 85    Minutes 15    METs 3      Prescription Details   Frequency (times per week) 3    Duration Progress to 30 minutes of continuous aerobic without signs/symptoms of physical distress      Intensity   THRR 40-80% of Max Heartrate 66-132    Ratings of Perceived Exertion 11-13    Perceived Dyspnea 0-4      Progression   Progression Continue to progress workloads to maintain intensity without signs/symptoms of physical distress.      Resistance Training   Training Prescription Yes    Weight 4 lbs    Reps 10-15             Discharge Exercise Prescription (Final Exercise Prescription Changes):  Exercise Prescription Changes - 01/16/22 1023       Response to Exercise   Blood Pressure (Admit) 124/80    Blood Pressure (Exercise) 110/82    Blood Pressure (Exit) 106/66    Heart Rate (Admit) 80 bpm    Heart Rate (Exercise) 82 bpm    Heart Rate (Exit) 80 bpm    Rating of Perceived Exertion (Exercise) 12    Symptoms None    Duration Continue  with 30 min of aerobic exercise without signs/symptoms of physical distress.    Intensity THRR unchanged      Progression   Progression Continue to progress workloads to maintain intensity without signs/symptoms of physical distress.    Average METs 2.2      Resistance Training   Training Prescription Yes    Weight 5 lbs    Reps 10-15    Time 10 Minutes      Interval Training   Interval Training No      NuStep   Level 5    SPM 85    Minutes 15    METs 1.9      Arm Ergometer   Level 3    Minutes 15    METs 2.5      Home Exercise Plan   Plans to continue exercise at Home (comment)   Walking   Frequency Add 2 additional days to program exercise sessions.    Initial Home Exercises Provided 01/05/22             Functional Capacity:  6 Minute Walk     Row Name 12/09/21 1001  6 Minute Walk   Phase Initial     Distance 1632 feet     Walk Time 6 minutes     # of Rest Breaks 0     MPH 3.09     METS 3.79     RPE 10     Perceived Dyspnea  0     VO2 Peak 13.57     Symptoms Yes (comment)     Comments Bilateral hip pain "3-4/10" on pain scale.     Resting HR 74 bpm     Resting BP 122/80     Resting Oxygen Saturation  95 %     Exercise Oxygen Saturation  during 6 min walk 98 %     Max Ex. HR 84 bpm     Max Ex. BP 142/86     2 Minute Post BP 148/90              Psychological, QOL, Others - Outcomes: PHQ 2/9:    12/09/2021    4:01 PM 08/21/2021    2:56 PM 08/14/2021    2:06 PM 09/09/2015    4:28 PM  Depression screen PHQ 2/9  Decreased Interest 0 0 0 0  Down, Depressed, Hopeless 0 0 0 0  PHQ - 2 Score 0 0 0 0    Quality of Life:  Quality of Life - 12/09/21 1512       Quality of Life   Select Quality of Life      Quality of Life Scores   Health/Function Pre 25.9 %    Socioeconomic Pre 23.5 %    Psych/Spiritual Pre 25.5 %    Family Pre 21 %    GLOBAL Pre 24.95 %             Personal Goals: Goals established at orientation with  interventions provided to work toward goal.  Personal Goals and Risk Factors at Admission - 12/09/21 0932       Core Components/Risk Factors/Patient Goals on Admission    Weight Management Yes;Obesity;Weight Loss    Intervention Weight Management/Obesity: Establish reasonable short term and long term weight goals.;Obesity: Provide education and appropriate resources to help participant work on and attain dietary goals.    Admit Weight 257 lb (116.6 kg)    Goal Weight: Short Term 250 lb (113.4 kg)    Goal Weight: Long Term 225 lb (102.1 kg)    Expected Outcomes Short Term: Continue to assess and modify interventions until short term weight is achieved;Long Term: Adherence to nutrition and physical activity/exercise program aimed toward attainment of established weight goal;Weight Loss: Understanding of general recommendations for a balanced deficit meal plan, which promotes 1-2 lb weight loss per week and includes a negative energy balance of [id number] kcal/d    Diabetes Yes    Intervention Provide education about signs/symptoms and action to take for hypo/hyperglycemia.;Provide education about proper nutrition, including hydration, and aerobic/resistive exercise prescription along with prescribed medications to achieve blood glucose in normal ranges: Fasting glucose 65-99 mg/dL    Expected Outcomes Short Term: Participant verbalizes understanding of the signs/symptoms and immediate care of hyper/hypoglycemia, proper foot care and importance of medication, aerobic/resistive exercise and nutrition plan for blood glucose control.;Long Term: Attainment of HbA1C < 7%.    Heart Failure Yes    Intervention Provide a combined exercise and nutrition program that is supplemented with education, support and counseling about heart failure. Directed toward relieving symptoms such as shortness of breath, decreased exercise tolerance, and extremity edema.  Expected Outcomes Improve functional capacity of  life;Short term: Attendance in program 2-3 days a week with increased exercise capacity. Reported lower sodium intake. Reported increased fruit and vegetable intake. Reports medication compliance.;Short term: Daily weights obtained and reported for increase. Utilizing diuretic protocols set by physician.;Long term: Adoption of self-care skills and reduction of barriers for early signs and symptoms recognition and intervention leading to self-care maintenance.    Hypertension Yes    Intervention Provide education on lifestyle modifcations including regular physical activity/exercise, weight management, moderate sodium restriction and increased consumption of fresh fruit, vegetables, and low fat dairy, alcohol moderation, and smoking cessation.;Monitor prescription use compliance.    Expected Outcomes Short Term: Continued assessment and intervention until BP is < 140/47m HG in hypertensive participants. < 130/867mHG in hypertensive participants with diabetes, heart failure or chronic kidney disease.;Long Term: Maintenance of blood pressure at goal levels.    Lipids Yes    Intervention Provide education and support for participant on nutrition & aerobic/resistive exercise along with prescribed medications to achieve LDL <7067mHDL >41m12m  Expected Outcomes Short Term: Participant states understanding of desired cholesterol values and is compliant with medications prescribed. Participant is following exercise prescription and nutrition guidelines.;Long Term: Cholesterol controlled with medications as prescribed, with individualized exercise RX and with personalized nutrition plan. Value goals: LDL < 70mg23mL > 40 mg.    Stress Yes    Intervention Offer individual and/or small group education and counseling on adjustment to heart disease, stress management and health-related lifestyle change. Teach and support self-help strategies.;Refer participants experiencing significant psychosocial distress to  appropriate mental health specialists for further evaluation and treatment. When possible, include family members and significant others in education/counseling sessions.    Expected Outcomes Short Term: Participant demonstrates changes in health-related behavior, relaxation and other stress management skills, ability to obtain effective social support, and compliance with psychotropic medications if prescribed.;Long Term: Emotional wellbeing is indicated by absence of clinically significant psychosocial distress or social isolation.              Personal Goals Discharge:  Goals and Risk Factor Review     Row Name 12/23/21 1520 01/20/22 0812 2202      Core Components/Risk Factors/Patient Goals Review   Personal Goals Review Weight Management/Obesity;Lipids;Stress;Diabetes;Hypertension Weight Management/Obesity;Lipids;Stress;Diabetes;Hypertension      Review Krystofer Dezmonff to a good start to exercise. Vital signs have been stable. CBG's have been variable. Grey Keyontebeen doing well with  exercise when in attendance. Vital signs have been stable. CBG's have been variable. Keno Uriasurrently admitted to the hospital. Exercise is on hold      Expected Outcomes Vikas Osmel continue to participate in phase 2 cardiac rehab for exercise, nutrition and lifestyle modifications Leone Dontez continue to participate in phase 2 cardiac rehab for exercise, nutrition and lifestyle modifications when cleared to return to exercise.               Exercise Goals and Review:  Exercise Goals     Row Name 12/09/21 0930             Exercise Goals   Increase Physical Activity Yes       Intervention Provide advice, education, support and counseling about physical activity/exercise needs.;Develop an individualized exercise prescription for aerobic and resistive training based on initial evaluation findings, risk stratification, comorbidities and participant's personal goals.       Expected Outcomes Short Term:  Attend rehab on a  regular basis to increase amount of physical activity.;Long Term: Exercising regularly at least 3-5 days a week.;Long Term: Add in home exercise to make exercise part of routine and to increase amount of physical activity.       Increase Strength and Stamina Yes       Intervention Provide advice, education, support and counseling about physical activity/exercise needs.;Develop an individualized exercise prescription for aerobic and resistive training based on initial evaluation findings, risk stratification, comorbidities and participant's personal goals.       Expected Outcomes Short Term: Increase workloads from initial exercise prescription for resistance, speed, and METs.;Short Term: Perform resistance training exercises routinely during rehab and add in resistance training at home;Long Term: Improve cardiorespiratory fitness, muscular endurance and strength as measured by increased METs and functional capacity (6MWT)       Able to understand and use rate of perceived exertion (RPE) scale Yes       Intervention Provide education and explanation on how to use RPE scale       Expected Outcomes Short Term: Able to use RPE daily in rehab to express subjective intensity level;Long Term:  Able to use RPE to guide intensity level when exercising independently       Knowledge and understanding of Target Heart Rate Range (THRR) Yes       Intervention Provide education and explanation of THRR including how the numbers were predicted and where they are located for reference       Expected Outcomes Short Term: Able to state/look up THRR;Long Term: Able to use THRR to govern intensity when exercising independently;Short Term: Able to use daily as guideline for intensity in rehab       Able to check pulse independently Yes       Intervention Provide education and demonstration on how to check pulse in carotid and radial arteries.;Review the importance of being able to check your own pulse for  safety during independent exercise       Expected Outcomes Short Term: Able to explain why pulse checking is important during independent exercise;Long Term: Able to check pulse independently and accurately       Understanding of Exercise Prescription Yes       Intervention Provide education, explanation, and written materials on patient's individual exercise prescription       Expected Outcomes Short Term: Able to explain program exercise prescription;Long Term: Able to explain home exercise prescription to exercise independently                Exercise Goals Re-Evaluation:  Exercise Goals Re-Evaluation     Row Name 12/15/21 1133 12/22/21 1116 01/05/22 1050 02/04/22 0959       Exercise Goal Re-Evaluation   Exercise Goals Review Increase Physical Activity;Able to understand and use rate of perceived exertion (RPE) scale Increase Physical Activity;Able to understand and use rate of perceived exertion (RPE) scale Increase Physical Activity;Able to understand and use rate of perceived exertion (RPE) scale;Increase Strength and Stamina;Able to check pulse independently;Knowledge and understanding of Target Heart Rate Range (THRR);Understanding of Exercise Prescription Increase Physical Activity;Able to understand and use rate of perceived exertion (RPE) scale;Increase Strength and Stamina;Able to check pulse independently;Knowledge and understanding of Target Heart Rate Range (THRR);Understanding of Exercise Prescription    Comments Patient able to understand and use RPE scale appropriately. Patient is making good progress with exercise. Patient's goal is to lose weight and to feel like he's working hard with exercise. Patient's goal weight is 225 lbs. Increased WL  on bike and increased hand weights. Reviewed exercise prescription with patient. Patient is currently walking as his mode of home exercise. Patient plans to increase walking to 30 minutes (2 times around the block, ~2 miles and 200 feet)  at least 2 days/week in addition to exercise at cardiac rehab with a goal of achieving 150 minutes of aerobic exercise/week. Patient would like to resume riding his mountain bike once cleared by his cardiologist. Patient has a pulse oximeter to monitor his pulse. Patient plans to get some 5 lb hand weights to use for his resistance training 1-3 days/week. Discussed using household items (ie cans, water bottles) for his resistance exercises in the meantime. Patient does not plan to return to cardiac rehab. Patient states that he appreciated his time in the program and plans to continue walking, biking and mowing the lawn.    Expected Outcomes Progress workloads as tolerated to help achieve personal health and fitness goals. Continue to progress workloads to help achieve weight loss goals. Patient will add 30 minutes walking 2-3 days/week in addition to exericse at cardiac rehab to achieve 150 minutes of aerobic exericse/week. Patient will continue home exercise routine walking and biking to maintain health and fitness.             Nutrition & Weight - Outcomes:  Pre Biometrics - 12/09/21 0922       Pre Biometrics   Waist Circumference 47 inches    Hip Circumference 41.25 inches    Waist to Hip Ratio 1.14 %    Triceps Skinfold 30 mm    % Body Fat 35.1 %    Grip Strength 43 kg    Flexibility 16.75 in    Single Leg Stand 12.43 seconds              Nutrition:  Nutrition Therapy & Goals - 12/29/21 1151       Nutrition Therapy   Diet Heart Healthy Diet    Drug/Food Interactions Statins/Certain Fruits;Purine/Gout      Personal Nutrition Goals   Nutrition Goal Implement heart healthy lifestyle to incorporate lean protein/plant protein, nuts/seeds, fruits/vegetables, whole grains, low fat dairy.    Personal Goal #2 Patient to name foods that impact blood glucose and limit refined carbohydrates, simple sugars, sugary beverages, etc.    Personal Goal #3 Patient to understand food sources  of high sodium foods and to decrease sodium intake to <2375m daily.    Comments Patient able to teach back concepts/strategies for blood sugar control (fiber/carb + protein). He reports confidence in keeping sodium intake <20018mday. He is actively working on a plan to get consistent transportation in place and grocery delivery set up.      Intervention Plan   Intervention Prescribe, educate and counsel regarding individualized specific dietary modifications aiming towards targeted core components such as weight, hypertension, lipid management, diabetes, heart failure and other comorbidities.;Nutrition handout(s) given to patient.    Expected Outcomes Short Term Goal: Understand basic principles of dietary content, such as calories, fat, sodium, cholesterol and nutrients.;Short Term Goal: A plan has been developed with personal nutrition goals set during dietitian appointment.;Long Term Goal: Adherence to prescribed nutrition plan.             Nutrition Discharge:   Education Questionnaire Score:  Knowledge Questionnaire Score - 12/09/21 1513       Knowledge Questionnaire Score   Pre Score 23/24             Larron completed cardiac rehab program on  01/16/22 with completion of 13 exercise sessions in Phase II. Pt maintained good attendance and progressed nicely during his participation in rehab as evidenced by increased MET level. Eladio did not return after being hospitalized with the flu. Pt has made significant lifestyle changes and should be commended for his success. Pt feels he has achieved his goals during cardiac rehab.   Pt plans to continue exercise by walking, biking and mowing the grass. Osborne said he feels stronger after participating in the [place] [facility] RN BSN

## 2022-02-04 NOTE — Telephone Encounter (Signed)
Spoke with [name] he does not plan on returning to exercise at cardiac rehab. Billy [name] appreciates his care when in attendance in cardiac rehab. Will discharge from cardiac rehab at this time. Billy [name] plans to continue exercise by walking riding his bike and mowing the grass. Billy [name] attended 13 exercise sessions.Harrell Gave RN BSN

## 2022-02-06 ENCOUNTER — Encounter (HOSPITAL_COMMUNITY): Payer: Medicare Other

## 2022-02-06 ENCOUNTER — Ambulatory Visit (INDEPENDENT_AMBULATORY_CARE_PROVIDER_SITE_OTHER): Payer: Medicare Other | Admitting: Thoracic Surgery (Cardiothoracic Vascular Surgery)

## 2022-02-06 VITALS — BP 147/85 | HR 76 | Resp 20 | Ht 74.0 in | Wt 246.0 lb

## 2022-02-06 DIAGNOSIS — Z5189 Encounter for other specified aftercare: Secondary | ICD-10-CM

## 2022-02-06 DIAGNOSIS — Z951 Presence of aortocoronary bypass graft: Secondary | ICD-10-CM | POA: Diagnosis not present

## 2022-02-06 NOTE — Progress Notes (Signed)
     [address]       [place] [address]             [phone]       Billy [name] comes in to discuss an abnormal sensation along his xiphoid.  He states that in certain positions he noticed a popping along the portion of the sternotomy.  He denies any pain, and states that this mostly just annoying.  On exam his sternum is intact and there is no popping or clicking.  He does have a prominent xiphoid.  I reviewed his most recent CT scan that compared to a scan from 2017.  He does have a very prominent protruding xiphoid, but there is no evidence of malunion of the sternum hernias along the xiphoid.  We had a long discussion about potential interventions for this.  I explained to him that this was most cosmetic and I cannot assure him that he would not have any further pain if we were to remove the xiphoid process.  For the moment he would like to not undergo any surgical procedures.  He will call us if he changes his mind.

## 2022-03-02 ENCOUNTER — Telehealth: Payer: Self-pay | Admitting: *Deleted

## 2022-03-02 NOTE — Telephone Encounter (Signed)
Patient is calling with questions about his great toe and he thinks that it may be a fungus, explained to him that we would have to get him in and the doctor will examine to give a diagnosis and what type of treatments are available.  He verbalized understanding and he has scheduled for an upcoming appointment.

## 2022-03-03 ENCOUNTER — Ambulatory Visit: Payer: Medicare Other | Admitting: Podiatry

## 2022-03-23 ENCOUNTER — Ambulatory Visit (INDEPENDENT_AMBULATORY_CARE_PROVIDER_SITE_OTHER): Payer: Medicare Other | Admitting: Podiatry

## 2022-03-23 DIAGNOSIS — E1149 Type 2 diabetes mellitus with other diabetic neurological complication: Secondary | ICD-10-CM

## 2022-03-23 DIAGNOSIS — Z79899 Other long term (current) drug therapy: Secondary | ICD-10-CM | POA: Diagnosis not present

## 2022-03-23 DIAGNOSIS — B351 Tinea unguium: Secondary | ICD-10-CM

## 2022-03-23 NOTE — Patient Instructions (Signed)
Terbinafine Tablets What is this medication? TERBINAFINE (TER bin a feen) treats fungal infections of the nails. It belongs to a group of medications called antifungals. It will not treat infections caused by bacteria or viruses. This medicine may be used for other purposes; ask your health care provider or pharmacist if you have questions. COMMON BRAND NAME(S): Lamisil, Terbinex What should I tell my care team before I take this medication? They need to know if you have any of these conditions: Liver disease An unusual or allergic reaction to terbinafine, other medications, foods, dyes, or preservatives Pregnant or trying to get pregnant Breast-feeding How should I use this medication? Take this medication by mouth with water. Take it as directed on the prescription label at the same time every day. You can take it with or without food. If it upsets your stomach, take it with food. Keep taking it unless your care team tells you to stop. A special MedGuide will be given to you by the pharmacist with each prescription and refill. Be sure to read this information carefully each time. Talk to your care team regarding the use of this medication in children. Special care may be needed. Overdosage: If you think you have taken too much of this medicine contact a poison control center or emergency room at once. NOTE: This medicine is only for you. Do not share this medicine with others. What if I miss a dose? If you miss a dose, take it as soon as you can unless it is more than 4 hours late. If it is more than 4 hours late, skip the missed dose. Take the next dose at the normal time. What may interact with this medication? Do not take this medication with any of the following: Pimozide Thioridazine This medication may also interact with the following: Beta blockers Caffeine Certain medications for mental health conditions Cimetidine Cyclosporine Medications for fungal infections like fluconazole  and ketoconazole Medications for irregular heartbeat like amiodarone, flecainide and propafenone Rifampin Warfarin This list may not describe all possible interactions. Give your health care provider a list of all the medicines, herbs, non-prescription drugs, or dietary supplements you use. Also tell them if you smoke, drink alcohol, or use illegal drugs. Some items may interact with your medicine. What should I watch for while using this medication? Visit your care team for regular checks on your progress. You may need blood work while you are taking this medication. It may be some time before you see the benefit from this medication. This medication may cause serious skin reactions. They can happen weeks to months after starting the medication. Contact your care team right away if you notice fevers or flu-like symptoms with a rash. The rash may be red or purple and then turn into blisters or peeling of the skin. Or, you might notice a red rash with swelling of the face, lips or lymph nodes in your neck or under your arms. This medication can make you more sensitive to the sun. Keep out of the sun, If you cannot avoid being in the sun, wear protective clothing and sunscreen. Do not use sun lamps or tanning beds/booths. What side effects may I notice from receiving this medication? Side effects that you should report to your care team as soon as possible: Allergic reactions--skin rash, itching, hives, swelling of the face, lips, tongue, or throat Change in sense of smell Change in taste Infection--fever, chills, cough, or sore throat Liver injury--right upper belly pain, loss of appetite, nausea,   light-colored stool, dark yellow or brown urine, yellowing skin or eyes, unusual weakness or fatigue Low red blood cell level--unusual weakness or fatigue, dizziness, headache, trouble breathing Lupus-like syndrome--joint pain, swelling, or stiffness, butterfly-shaped rash on the face, rashes that get worse  in the sun, fever, unusual weakness or fatigue Rash, fever, and swollen lymph nodes Redness, blistering, peeling, or loosening of the skin, including inside the mouth Unusual bruising or bleeding Worsening mood, feelings of depression Side effects that usually do not require medical attention (report to your care team if they continue or are bothersome): Diarrhea Gas Headache Nausea Stomach pain Upset stomach This list may not describe all possible side effects. Call your doctor for medical advice about side effects. You may report side effects to FDA at 1-800-FDA-1088. Where should I keep my medication? Keep out of the reach of children and pets. Store between 20 and 25 degrees C (68 and 77 degrees F). Protect from light. Get rid of any unused medication after the expiration date. To get rid of medications that are no longer needed or have expired: Take the medication to a medication take-back program. Check with your pharmacy or law enforcement to find a location. If you cannot return the medication, check the label or package insert to see if the medication should be thrown out in the garbage or flushed down the toilet. If you are not sure, ask your care team. If it is safe to put it in the trash, take the medication out of the container. Mix the medication with cat litter, dirt, coffee grounds, or other unwanted substance. Seal the mixture in a bag or container. Put it in the trash. NOTE: This sheet is a summary. It may not cover all possible information. If you have questions about this medicine, talk to your doctor, pharmacist, or health care provider.  2023 Elsevier/Gold Standard (2021-04-16 00:00:00)

## 2022-03-24 LAB — CBC WITH DIFFERENTIAL/PLATELET
Absolute Monocytes: 410 cells/uL (ref 200–950)
Basophils Absolute: 32 cells/uL (ref 0–200)
Basophils Relative: 0.5 %
Eosinophils Absolute: 120 cells/uL (ref 15–500)
Eosinophils Relative: 1.9 %
HCT: 44.1 % (ref 38.5–50.0)
Hemoglobin: 15.3 g/dL (ref 13.2–17.1)
Lymphs Abs: 1890 cells/uL (ref 850–3900)
MCH: 32 pg (ref 27.0–33.0)
MCHC: 34.7 g/dL (ref 32.0–36.0)
MCV: 92.3 fL (ref 80.0–100.0)
MPV: 9.6 fL (ref 7.5–12.5)
Monocytes Relative: 6.5 %
Neutro Abs: 3849 cells/uL (ref 1500–7800)
Neutrophils Relative %: 61.1 %
Platelets: 211 10*3/uL (ref 140–400)
RBC: 4.78 10*6/uL (ref 4.20–5.80)
RDW: 13 % (ref 11.0–15.0)
Total Lymphocyte: 30 %
WBC: 6.3 10*3/uL (ref 3.8–10.8)

## 2022-03-24 LAB — HEPATIC FUNCTION PANEL
AG Ratio: 1.9 (calc) (ref 1.0–2.5)
ALT: 15 U/L (ref 9–46)
AST: 17 U/L (ref 10–35)
Albumin: 4.3 g/dL (ref 3.6–5.1)
Alkaline phosphatase (APISO): 49 U/L (ref 35–144)
Bilirubin, Direct: 0.1 mg/dL (ref 0.0–0.2)
Globulin: 2.3 g/dL (calc) (ref 1.9–3.7)
Indirect Bilirubin: 0.4 mg/dL (calc) (ref 0.2–1.2)
Total Bilirubin: 0.5 mg/dL (ref 0.2–1.2)
Total Protein: 6.6 g/dL (ref 6.1–8.1)
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
## 2022-03-27 ENCOUNTER — Other Ambulatory Visit: Payer: Self-pay | Admitting: Podiatry

## 2022-04-01 ENCOUNTER — Other Ambulatory Visit: Payer: Self-pay | Admitting: Podiatry

## 2022-04-01 ENCOUNTER — Telehealth: Payer: Self-pay

## 2022-04-01 ENCOUNTER — Telehealth: Payer: Self-pay | Admitting: Podiatry

## 2022-04-01 DIAGNOSIS — Z79899 Other long term (current) drug therapy: Secondary | ICD-10-CM

## 2022-04-01 MED ORDER — TERBINAFINE HCL 250 MG PO TABS: 250.0000 mg | ORAL_TABLET | Freq: Every day | ORAL | 0 refills | Status: DC

## 2022-04-06 NOTE — Telephone Encounter (Signed)
Noted, thanks!

## 2022-04-24 ENCOUNTER — Other Ambulatory Visit (HOSPITAL_COMMUNITY): Payer: Medicare HMO

## 2022-04-24 ENCOUNTER — Encounter (HOSPITAL_COMMUNITY): Payer: Medicare HMO | Admitting: Cardiology

## 2022-05-01 ENCOUNTER — Other Ambulatory Visit (HOSPITAL_BASED_OUTPATIENT_CLINIC_OR_DEPARTMENT_OTHER): Payer: Self-pay | Admitting: Cardiovascular Disease

## 2022-05-01 NOTE — Telephone Encounter (Signed)
Rx request sent to pharmacy.  

## 2022-06-02 ENCOUNTER — Ambulatory Visit: Payer: Self-pay

## 2022-06-02 NOTE — Patient Instructions (Signed)
Visit Information  Thank you for taking time to visit with me today. Please don't hesitate to contact me if I can be of assistance to you.   Following are the goals we discussed today:   Goals Addressed             This Visit's Progress    COMPLETED: Care Coordination Activities       Care Coordination Interventions: SDoH screening performed - no acute resource challenges identified at this time. Discussed the patient is actively using [place] benefit and food and nutrition services Determined the patient does not have concerns with medication costs and/or adherence Education provided to the patient on the role of the care coordination team - patient is agreeable to a call with RN Care Manager to complete a clinical assessment Discussed patient has questions regarding Carvedilol prescription, encouraged the patient to contact his cardiologist  Collaboration with RN Care Manager to advise of patients interest in participation with care coordination team        Please call the care guide team at [phone] if you need to schedule an appointment with our care coordination team.  If you are experiencing a Mental Health or [place] or need someone to talk to, please call 1-800-273-TALK (toll free, 24 hour hotline)  Patient verbalizes understanding of instructions and care plan provided today and agrees to view in [place]. Active MyChart status and patient understanding of how to access instructions and care plan via MyChart confirmed with patient.     No further follow up required: Please contact your provider as needed. A nurse case manager will contact you by phone.  Daneen Schick, BSW, CDP Social Worker, Certified Dementia Practitioner [facility]  Care Coordination [phone]

## 2022-06-02 NOTE — Patient Outreach (Signed)
  Care Coordination   Initial Visit Note   06/02/2022 Name: TAYSHUN GAPPA MRN: [id number] DOB: Jul 21, 1966  [name] is a 56 y.o. year old male who sees [place], Annie Main, MD for primary care. I spoke with  [name] by phone today.  What matters to the patients health and wellness today?  I am working on managing my health better since my heart attack last year   Goals Addressed             This Visit's Progress    COMPLETED: Care Coordination Activities       Care Coordination Interventions: SDoH screening performed - no acute resource challenges identified at this time. Discussed the patient is actively using [place] benefit and food and nutrition services Determined the patient does not have concerns with medication costs and/or adherence Education provided to the patient on the role of the care coordination team - patient is agreeable to a call with RN Care Manager to complete a clinical assessment Discussed patient has questions regarding Carvedilol prescription, encouraged the patient to contact his cardiologist  Collaboration with RN Care Manager to advise of patients interest in participation with care coordination team        SDOH assessments and interventions completed:  Yes  SDOH Interventions Today    Flowsheet Row Most Recent Value  SDOH Interventions   Food Insecurity Interventions Intervention Not Indicated  Housing Interventions Intervention Not Indicated  Transportation Interventions Intervention Not Indicated  Utilities Interventions Intervention Not Indicated        Care Coordination Interventions Activated:  Yes  Care Coordination Interventions:  Yes, provided   Follow up plan: Referral made to [facility].    Encounter Outcome:  Pt. Visit Completed   Daneen Schick, BSW, CDP Social Worker, Certified Dementia Practitioner [facility]  Care Coordination [phone]

## 2022-06-12 NOTE — Telephone Encounter (Signed)
signed

## 2022-06-17 ENCOUNTER — Ambulatory Visit: Payer: Self-pay

## 2022-06-17 NOTE — Patient Outreach (Signed)
  Care Coordination   06/17/2022 Name: ELSON ULBRICH MRN: [id number] DOB: [birth year]   Care Coordination Outreach Attempts:  An unsuccessful telephone outreach was attempted for a scheduled appointment today.  Follow Up Plan:  Additional outreach attempts will be made to offer the patient care coordination information and services.   Encounter Outcome:  No Answer  Care Coordination Interventions Activated:  No   Care Coordination Interventions:  No, not indicated    Barb Merino, RN, BSN, CCM Care Management Coordinator [facility]  Direct Phone: [phone]

## 2022-06-26 ENCOUNTER — Ambulatory Visit: Payer: Self-pay

## 2022-06-26 ENCOUNTER — Telehealth: Payer: Self-pay | Admitting: Cardiovascular Disease

## 2022-06-26 DIAGNOSIS — E1165 Type 2 diabetes mellitus with hyperglycemia: Secondary | ICD-10-CM

## 2022-06-26 DIAGNOSIS — I10 Essential (primary) hypertension: Secondary | ICD-10-CM

## 2022-06-26 MED ORDER — CARVEDILOL 6.25 MG PO TABS: 6.2500 mg | ORAL_TABLET | Freq: Two times a day (BID) | ORAL | 1 refills | Status: DC

## 2022-06-26 NOTE — Telephone Encounter (Signed)
Rx request sent to pharmacy.  

## 2022-06-26 NOTE — Patient Outreach (Signed)
  Care Coordination   Initial Visit Note   06/26/2022 Name: Billy [name] MRN: [id number] DOB: Oct 30, 1965  Billy [name] is a 56 y.o. year old male who sees [place], Annie Main, MD for primary care. I spoke with  Billy [name] by phone today.  What matters to the patients health and wellness today?  Patient will call his Cardiologist to schedule a follow up appointment and verify his Carvedilol dosage/frequency.     Goals Addressed               This Visit's Progress     Patient Stated     I will call my Cardiologist to confirm my medication dosage (pt-stated)        Care Coordination Interventions: Evaluation of current treatment plan related to hypertension self management and patient's adherence to plan as established by provider Reviewed medications with patient and discussed importance of compliance Counseled on the importance of exercise goals with target of 150 minutes per week Advised patient, providing education and rationale, to monitor blood pressure daily and record, calling PCP for findings outside established parameters Advised patient to discuss Carvedilol dosage and frequency with provider Discussed complications of poorly controlled blood pressure such as heart disease, stroke, circulatory complications, vision complications, kidney impairment, sexual dysfunction Sent referral for [place] to assist with resources for home repairs Sent Pharm D referral to assist with education and information related to natural supplementation for Cholesterol and BP management per patient's request          SDOH assessments and interventions completed:  Yes   [place] referral sent to offer assistance with resources to help with home repairs  Care Coordination Interventions Activated:  Yes  Care Coordination Interventions:  Yes, provided   Follow up plan: Referral made to [facility] for assistance with natural supplementation for BP and Cholesterol  management  Follow up call scheduled for 09/02/22 '@10'$ :30 AM    Encounter Outcome:  Pt. Visit Completed

## 2022-06-26 NOTE — Telephone Encounter (Signed)
Should not have any interactions

## 2022-06-26 NOTE — Telephone Encounter (Signed)
  Pt would like to speak with Dr. Blenda Mounts nurse to discuss about his medications

## 2022-06-26 NOTE — Telephone Encounter (Signed)
Left detailed message, ok per DPR   Patient will monitor blood pressure daily prior to starting for 2 weeks and then after starts

## 2022-06-26 NOTE — Patient Instructions (Signed)
Visit Information  Thank you for taking time to visit with me today. Please don't hesitate to contact me if I can be of assistance to you.   Following are the goals we discussed today:   Goals Addressed               This Visit's Progress     Patient Stated     I will call my Cardiologist to confirm my medication dosage (pt-stated)        Care Coordination Interventions: Evaluation of current treatment plan related to hypertension self management and patient's adherence to plan as established by provider Reviewed medications with patient and discussed importance of compliance Counseled on the importance of exercise goals with target of 150 minutes per week Advised patient, providing education and rationale, to monitor blood pressure daily and record, calling PCP for findings outside established parameters Advised patient to discuss Carvedilol dosage and frequency with provider Discussed complications of poorly controlled blood pressure such as heart disease, stroke, circulatory complications, vision complications, kidney impairment, sexual dysfunction Sent referral for [place] to assist with resources for home repairs Sent Pharm D referral to assist with education and information related to natural supplementation for Cholesterol and BP management per patient's request          Our next appointment is by telephone on 09/02/22 at 10:30 AM  Please call the care guide team at [phone] if you need to cancel or reschedule your appointment.   If you are experiencing a Mental Health or [place] or need someone to talk to, please call 1-800-273-TALK (toll free, 24 hour hotline) go to [facility] [address] [place] [phone])  Patient verbalizes understanding of instructions and care plan provided today and agrees to view in [place]. Active MyChart status and patient understanding of how to access instructions  and care plan via MyChart confirmed with patient.     Barb Merino, RN, BSN, CCM Care Management Coordinator [facility] Direct Phone: [phone]

## 2022-06-26 NOTE — Telephone Encounter (Signed)
 *  STAT* If patient is at the pharmacy, call can be transferred to refill team.   1. Which medications need to be refilled? (please list name of each medication and dose if known) carvedilol (COREG) 6.25 MG tablet  2. Which pharmacy/location (including street and city if local pharmacy) is medication to be sent to? [place], [place]  3. Do they need a 30 day or 90 day supply? 90 days

## 2022-06-26 NOTE — Telephone Encounter (Signed)
Patient wants to start Olive oil leaf extract to help with blood pressure lowering  Will forward to Pharm D to make sure ok to take with current medications

## 2022-06-30 ENCOUNTER — Telehealth: Payer: Self-pay

## 2022-06-30 ENCOUNTER — Ambulatory Visit: Payer: Self-pay

## 2022-06-30 NOTE — Patient Outreach (Signed)
  Care Coordination   Follow Up Visit Note   06/30/2022 Name: JAKARI SADA MRN: [id number] DOB: 08-Apr-1966  [name] is a 56 y.o. year old male who sees [place], Annie Main, MD for primary care. I spoke with  [name] by phone today.  What matters to the patients health and wellness today?  To obtain assistance with home repairs    Goals Addressed             This Visit's Progress    Care Coordination Activities       Care Coordination Interventions: Referral received from [facility] requesting SW assistance with home repair needs Determined the patient needs assistance with insulating his home as well as a ramp repair to enter the home Education provided to the patient about [place] - patient agreeable to referral Collaboration with Sharol Given to place patient referral Scheduled follow up to the patient over the next month         SDOH assessments and interventions completed:  No     Care Coordination Interventions Activated:  Yes  Care Coordination Interventions:  Yes, provided   Follow up plan: Follow up call scheduled for 11/14    Encounter Outcome:  Pt. Visit Completed   Daneen Schick, BSW, CDP Social Worker, Certified Dementia Practitioner [facility]  Care Coordination [phone]

## 2022-06-30 NOTE — Patient Instructions (Signed)
Visit Information  Thank you for taking time to visit with me today. Please don't hesitate to contact me if I can be of assistance to you.   Following are the goals we discussed today:   Goals Addressed             This Visit's Progress    Care Coordination Activities       Care Coordination Interventions: Referral received from [place] requesting SW assistance with home repair needs Determined the patient needs assistance with insulating his home as well as a ramp repair to enter the home Education provided to the patient about [place] - patient agreeable to referral Collaboration with Sharol Given to place patient referral Scheduled follow up to the patient over the next month         Our next appointment is by telephone on 11/14 at 11:00  Please call the care guide team at [phone] if you need to cancel or reschedule your appointment.   If you are experiencing a Mental Health or [place] or need someone to talk to, please go to [facility] [address] [place] [phone])  Patient verbalizes understanding of instructions and care plan provided today and agrees to view in [place]. Active MyChart status and patient understanding of how to access instructions and care plan via MyChart confirmed with patient.     Telephone follow up appointment with care management team member scheduled for:11/14  Daneen Schick, [place], CDP Social Worker, Certified Dementia Practitioner [place] [facility]  Care Coordination [phone]

## 2022-06-30 NOTE — Patient Outreach (Signed)
  Care Coordination   06/30/2022 Name: Billy [name] MRN: [id number] DOB: 12/07/1965   Care Coordination Outreach Attempts:  An unsuccessful telephone outreach was attempted today to offer the patient information about available care coordination services as a benefit of their health plan.   Follow Up Plan:  Additional outreach attempts will be made to offer the patient care coordination information and services.   Encounter Outcome:  No Answer  Care Coordination Interventions Activated:  No   Care Coordination Interventions:  No, not indicated    Daneen Schick, BSW, CDP Social Worker, Certified Dementia Practitioner [facility] Care Management  Care Coordination [phone]

## 2022-07-01 ENCOUNTER — Ambulatory Visit: Payer: Self-pay

## 2022-07-01 NOTE — Patient Instructions (Signed)
Visit Information  Thank you for taking time to visit with me today. Please don't hesitate to contact me if I can be of assistance to you.   Following are the goals we discussed today:   Goals Addressed             This Visit's Progress    Care Coordination Activities       Care Coordination Interventions: Collaboration with Sharol Given from [employer] who indicates the property patient lives in is listed as a Journalist, newspaper and therefore patient does not qualify Determined the patients name is not on the deed but his family purchased the home for him, patient does not pay rent Discussed plan for SW to follow up with Sharol Given to determine if patient will qualify for assistance considering his family purchased the home on his behalf Collaboration with Sharol Given to request feedback regarding patients eligibility          If you are experiencing a Mental Health or [place] or need someone to talk to, please call 1-800-273-TALK (toll free, 24 hour hotline)  Patient verbalizes understanding of instructions and care plan provided today and agrees to view in [place]. Active MyChart status and patient understanding of how to access instructions and care plan via MyChart confirmed with patient.     Daneen Schick, BSW, CDP Social Worker, Certified Dementia Practitioner [place] [facility]  Care Coordination [phone]

## 2022-07-01 NOTE — Patient Outreach (Signed)
  Care Coordination   Follow Up Visit Note   07/01/2022 Name: Billy [name] MRN: [id number] DOB: 08-23-1966  Billy [name] is a 56 y.o. year old male who sees [place], Annie Main, MD for primary care. I spoke with  Billy [name] by phone today.  What matters to the patients health and wellness today?  To obtain assistance with home repairs    Goals Addressed             This Visit's Progress    Care Coordination Activities       Care Coordination Interventions: Collaboration with Sharol Given from [employer] who indicates the property patient lives in is listed as a Journalist, newspaper and therefore patient does not qualify Determined the patients name is not on the deed but his family purchased the home for him, patient does not pay rent Discussed plan for SW to follow up with Sharol Given to determine if patient will qualify for assistance considering his family purchased the home on his behalf Collaboration with Sharol Given to request feedback regarding patients eligibility         SDOH assessments and interventions completed:  No     Care Coordination Interventions Activated:  Yes  Care Coordination Interventions:  Yes, provided   Follow up plan:  SW will continue to follow    Encounter Outcome:  Pt. Visit Completed   Nadara Eaton, CDP Social Worker, Certified Dementia Practitioner [facility] Care Management  Care Coordination [phone]

## 2022-07-07 ENCOUNTER — Telehealth: Payer: Self-pay | Admitting: Podiatry

## 2022-07-07 ENCOUNTER — Other Ambulatory Visit: Payer: Self-pay | Admitting: Podiatry

## 2022-07-07 DIAGNOSIS — Z79899 Other long term (current) drug therapy: Secondary | ICD-10-CM

## 2022-07-07 NOTE — Telephone Encounter (Signed)
Patient called and stated they would like a refill on Lamisil.  Please advise

## 2022-07-07 NOTE — Telephone Encounter (Addendum)
Pt called again asking If only calling medication in [facility] not the local [facility] please. If needing labs he does not [facility] to [facility] [address] as he had a bad experience there. So if you could send [facility] a different [facility] would be fine

## 2022-07-08 ENCOUNTER — Telehealth: Payer: Self-pay | Admitting: *Deleted

## 2022-07-08 ENCOUNTER — Telehealth: Payer: Self-pay | Admitting: Pharmacist

## 2022-07-08 NOTE — Progress Notes (Addendum)
[facility])  [place] Team    07/08/2022  Billy [name] December 21, 1965 [id number]  Reason for referral: Medication Review  Referral source: [facility] Social Worker Current insurance: [employer]  PMHx includes but not limited to:  type 2 diabetes, hyperlipidemia, STEMI status post CABG, stage III CKD  Outreach:  Successful telephone call with patient.  HIPAA identifiers verified.   Subjective:  Patient was referred for medication education by [place] [facility] because the patient wanted information on how to control his blood pressure naturally or with vitamins versus prescribed medications.     Objective: The ASCVD Risk score (Arnett DK, et al., 2019) failed to calculate for the following reasons:   The patient has a prior MI or stroke diagnosis  Lab Results  Component Value Date   CREATININE 2.12 (H) 02/03/2022   CREATININE 2.35 (H) 01/20/2022   CREATININE 2.79 (H) 01/19/2022    Lab Results  Component Value Date   HGBA1C 6.9 (H) 01/19/2022    Lipid Panel     Component Value Date/Time   CHOL 208 (H) 07/07/2021 1353   TRIG 240 (H) 07/14/2021 0326   HDL 35 (L) 07/07/2021 1353   CHOLHDL 5.9 07/07/2021 1353   VLDL 42 (H) 07/07/2021 1353   LDLCALC 131 (H) 07/07/2021 1353    BP Readings from Last 3 Encounters:  02/06/22 (!) 147/85  02/03/22 (!) 154/98  01/20/22 116/75    Allergies  Allergen Reactions   Metformin Hcl     legs cramp    Medications Reviewed Today     Reviewed by Elayne Guerin, [facility] (Pharmacist) on 07/08/22 at [place] List Status: <None>   Medication Order Taking? Sig Documenting Provider Last Dose Status Informant  alprazolam (XANAX) 2 MG tablet [id number] Yes Take 1 tablet (2 mg total) by mouth 3 (three) times daily as needed for anxiety. Nita Sells, MD Taking Active Self, Pharmacy Records  apixaban [facility]) 5 MG TABS tablet [id number] Yes Take 1 tablet (5 mg total) by mouth 2 (two) times daily. Loel Dubonnet, NP Taking Active Self, Pharmacy Records  atorvastatin (LIPITOR) 80 MG tablet [id number] Yes Take 1 tablet (80 mg total) by mouth daily. [name], MD Taking Active   carvedilol (COREG) 6.25 MG tablet [id number] Yes Take 1 tablet (6.25 mg total) by mouth 2 (two) times daily with a meal. Skeet Latch, MD Taking Active   Cholecalciferol (VITAMIN D) 50 MCG (2000 UT) tablet [id number] Yes Take 2,000 Units by mouth daily. [provider] Taking Active Self, Pharmacy Records  clopidogrel (PLAVIX) 75 MG tablet [id number] Yes Take 1 tablet (75 mg total) by mouth daily. Swinyer, Lanice Schwab, NP Taking Active Self, Pharmacy Records  Continuous Blood Gluc Sensor (FREESTYLE LIBRE [place]) [place] [id number] Yes Use to check CBG three times daily.  Change sensore every 14 days. DX:  N27.782 [provider] Taking Active Self, Pharmacy Records  diclofenac Sodium (VOLTAREN) 1 % GEL [id number] Yes Apply 1 application. topically 4 (four) times daily as needed for pain. [provider] Taking Active Self, Pharmacy Records  FARXIGA 10 MG TABS tablet [id number] Yes TAKE 1 TABLET BY MOUTH DAILY BEFORE BREAKFAST. Skeet Latch, MD Taking Active   furosemide (LASIX) 20 MG tablet [id number] No Take 1 tablet (20 mg total) by mouth daily as needed.  Patient not taking: Reported on 06/26/2022   Rafael Bihari, FNP Not Taking Active   guaiFENesin-dextromethorphan [facility] DM) 100-10 MG/5ML syrup [id number] No Take 10  mLs by mouth every 4 (four) hours as needed for cough.  Patient not taking: Reported on 07/08/2022   [name], MD Not Taking Consider Medication Status and Discontinue (Completed Course)   hydrALAZINE (APRESOLINE) 25 MG tablet [id number] No Take 0.5 tablets (12.5 mg total) by mouth in the morning and at bedtime.  Patient not taking: Reported on 06/26/2022   Skeet Latch, MD Not Taking Consider Medication Status and Discontinue (Discontinued by provider)  Self, Pharmacy Records  Insulin Pen Needle (BD PEN NEEDLE NANO 2ND GEN) 32G X 4 MM MISC [id number] Yes USE WITH TRESIBA DAILY E11.39 [provider] Taking Active Self, Pharmacy Records  Lancets (FREESTYLE) lancets [id number] Yes CHECK BLOOD SUGAR 3 TIMES DAILY [provider] Taking Active Self, Pharmacy Records           Med Note Nash Mantis, [name]   Wed Jun 30, 2017  6:58 PM)    losartan (COZAAR) 25 MG tablet [id number] Yes Take 25 mg by mouth daily. [provider] Taking Active   MAGNESIUM CITRATE PO [id number] No Take 1 tablet by mouth daily.  Patient not taking: Reported on 07/08/2022   [provider] Not Taking Active Self, Pharmacy Records  meclizine (ANTIVERT) 12.5 MG tablet [id number] No Take 1 tablet (12.5 mg total) by mouth 2 (two) times daily as needed for dizziness.  Patient not taking: Reported on 07/08/2022   Yisroel Giovanni, PA-C Not Taking Consider Medication Status and Discontinue (Completed Course) Self, Pharmacy Records  methocarbamol (ROBAXIN) 500 MG tablet [id number] Yes Take 500 mg by mouth 3 (three) times daily as needed for muscle spasms. [provider] Taking Active Self, Pharmacy Records  OVER THE COUNTER MEDICATION [id number] Yes Take 1 tablet by mouth daily as needed (energy). Super Beets supplement [provider] Taking Active Self, Pharmacy Records  terbinafine (LAMISIL) 250 MG tablet [id number] Yes Take 1 tablet (250 mg total) by mouth daily. Trula Slade, DPM Taking Active   traZODone (DESYREL) 100 MG tablet [id number] No Take 100 mg by mouth at bedtime.  Patient not taking: Reported on 07/08/2022   [provider] Not Taking Active Self, Pharmacy Records  TRESIBA FLEXTOUCH 100 UNIT/ML FlexTouch Pen [id number] No Inject 10-20 Units into the skin daily as needed (BS over 150).  Patient not taking: Reported on 07/08/2022   [provider] Not Taking Consider Medication Status and Discontinue  (Change in therapy) Self, Pharmacy Records           Med Note Arnette Schaumann Jul 08, 2022  2:07 PM) Now on Humalog--inject 3 units with meals if eating >30gm of carbohydrate as of 09/23  zinc gluconate 50 MG tablet [id number] Yes Take 50 mg by mouth daily. [provider] Taking Active Self, Pharmacy Records            Assessment:  Medication Review Findings:  Spoke with patient about his medications at length. Reviewed his medications over the phone.  Educated patient on the importance of statin medications for secondary prevention as well as LDL and triglyceride reduction.  Patient was reminded to take all of his medications as prescribed and speak with his provider about any and all OTC products.   Patient shared that he is interested in taking beets, olive leaf, vitamin k, and coEnzyme 10.  He was cautioned to speak with his providers before adding any OTC medications to his regimen.   Tyler Aas was on the [facility] medication list but this was changed  to Humalog 3 units prior to meals >30g of carb due to hypoglycemia (per patient and verified with [facility] EMR) Patient was not clear on what clopidogrel was for and thought it was for blood pressure.  He was educated on clopidogrel and well. It was last filled 06/23 for a 90 day supply and is overdue for refills.--Patient said he would contact his provider about refills.   Medication Adherence Findings: Adherence Review  '[x]'$  Excellent (no doses missed/week)     '[]'$  Good (no more than 1 dose missed/week)     '[]'$  Partial (2-3 doses missed/week) '[]'$  Poor (>3 doses missed/week)  Patient with excellent understanding of regimen and excellent understanding of indications.    Medication Assistance Findings:  No medication assistance needs identified    Plan: Will close [facility] pharmacy case as no further medication needs identified at this time.  Am happy to assist in the future as needed.    Patient recorded my contact information for  future questions.  Elayne Guerin, PharmD, [place] Clinical Pharmacist [phone]

## 2022-07-08 NOTE — Telephone Encounter (Signed)
Left a detailed VM for the patient to call us back.

## 2022-07-08 NOTE — Telephone Encounter (Signed)
Patient is returning call to physician, missed message, explained that an order has been sent to lab,will need  to go to [facility] to have blood drawn , once completed and reviewed a new prescription for Lamisil will be sent to pharmacy on file.  He verbalized understanding and will have this done in a few days.

## 2022-07-08 NOTE — Telephone Encounter (Signed)
Pt called back and I transferred to nurse

## 2022-07-09 ENCOUNTER — Ambulatory Visit: Payer: Self-pay

## 2022-07-09 NOTE — Patient Instructions (Signed)
Visit Information  Thank you for taking time to visit with me today. Please don't hesitate to contact me if I can be of assistance to you.   Following are the goals we discussed today:   Goals Addressed             This Visit's Progress    Care Coordination Activities       Care Coordination Interventions: Collaboration with Sharol Given from [employer] who indicates since the patient is not on the deed of the home he will not qualify for assistance. She is mailing the patient a list of other potential resources Contacted the patient to advise he is not eligible to receive assistance from [employer] but to expect a list of other possible resources via mail Discussed plans for SW to follow up with the patient over the next two weeks        If you are experiencing a Mental Health or [place] or need someone to talk to, please call 1-800-273-TALK (toll free, 24 hour hotline)  Patient verbalizes understanding of instructions and care plan provided today and agrees to view in [place]. Active MyChart status and patient understanding of how to access instructions and care plan via MyChart confirmed with patient.     The care management team will reach out to the patient again over the next 14 days.

## 2022-07-09 NOTE — Patient Outreach (Signed)
  Care Coordination   Follow Up Visit Note   07/09/2022 Name: Billy [name] MRN: [id number] DOB: [birth year]  Billy [name] is a 56 y.o. year old male who sees [place], Annie Main, MD for primary care. I spoke with  Billy [name] by phone today.  What matters to the patients health and wellness today?  Identify resources to assist with home insulation    Goals Addressed             This Visit's Progress    Care Coordination Activities       Care Coordination Interventions: Collaboration with Sharol Given from [employer] who indicates since the patient is not on the deed of the home he will not qualify for assistance. She is mailing the patient a list of other potential resources Contacted the patient to advise he is not eligible to receive assistance from [employer] but to expect a list of other possible resources via mail Discussed plans for SW to follow up with the patient over the next two weeks         SDOH assessments and interventions completed:  No     Care Coordination Interventions Activated:  Yes  Care Coordination Interventions:  Yes, provided   Follow up plan:  SW will follow up over the next two weeks    Encounter Outcome:  Pt. Visit Completed   Daneen Schick, [place], CDP Social Worker, Certified Dementia Practitioner [place] Coordination [phone]

## 2022-07-16 LAB — HEPATIC FUNCTION PANEL
AG Ratio: 1.6 (calc) (ref 1.0–2.5)
ALT: 8 U/L — ABNORMAL LOW (ref 9–46)
AST: 14 U/L (ref 10–35)
Albumin: 4.1 g/dL (ref 3.6–5.1)
Alkaline phosphatase (APISO): 53 U/L (ref 35–144)
Bilirubin, Direct: 0.1 mg/dL (ref 0.0–0.2)
Globulin: 2.5 g/dL (calc) (ref 1.9–3.7)
Indirect Bilirubin: 0.4 mg/dL (calc) (ref 0.2–1.2)
Total Bilirubin: 0.5 mg/dL (ref 0.2–1.2)
Total Protein: 6.6 g/dL (ref 6.1–8.1)

## 2022-07-16 LAB — CBC WITH DIFFERENTIAL/PLATELET
Absolute Monocytes: 390 cells/uL (ref 200–950)
Basophils Absolute: 42 cells/uL (ref 0–200)
Basophils Relative: 0.8 %
Eosinophils Absolute: 161 cells/uL (ref 15–500)
Eosinophils Relative: 3.1 %
HCT: 43.8 % (ref 38.5–50.0)
Hemoglobin: 14.6 g/dL (ref 13.2–17.1)
Lymphs Abs: 1674 cells/uL (ref 850–3900)
MCH: 32 pg (ref 27.0–33.0)
MCHC: 33.3 g/dL (ref 32.0–36.0)
MCV: 96.1 fL (ref 80.0–100.0)
MPV: 10.4 fL (ref 7.5–12.5)
Monocytes Relative: 7.5 %
Neutro Abs: 2933 cells/uL (ref 1500–7800)
Neutrophils Relative %: 56.4 %
Platelets: 197 10*3/uL (ref 140–400)
RBC: 4.56 10*6/uL (ref 4.20–5.80)
RDW: 12.8 % (ref 11.0–15.0)
Total Lymphocyte: 32.2 %
WBC: 5.2 10*3/uL (ref 3.8–10.8)

## 2022-07-21 ENCOUNTER — Telehealth: Payer: Self-pay | Admitting: *Deleted

## 2022-07-21 ENCOUNTER — Other Ambulatory Visit: Payer: Self-pay | Admitting: Podiatry

## 2022-07-21 MED ORDER — TERBINAFINE HCL 250 MG PO TABS: 250.0000 mg | ORAL_TABLET | Freq: Every day | ORAL | 0 refills | Status: DC

## 2022-07-21 NOTE — Telephone Encounter (Signed)
Patient is calling for lab results from 07/15/22, please advise.

## 2022-07-23 ENCOUNTER — Ambulatory Visit: Payer: Self-pay

## 2022-07-23 NOTE — Patient Instructions (Signed)
Visit Information  Thank you for taking time to visit with me today. Please don't hesitate to contact me if I can be of assistance to you.   Following are the goals we discussed today:   Goals Addressed             This Visit's Progress    Care Coordination Activities       Care Coordination Interventions: Determined the patient has not yet received information from [employer] regarding resources he may qualify for since he is not eligible for their program Discussed the patient plans to speak with [place] regarding ramp repair Reviewed patients main concern surrounds around energy efficiency and adding more insulation under his home Verbal and written education provided to the patient about the [employer] program Scheduled follow up over the next month to confirm receipt of mailed resource          Our next appointment is by telephone on 12/5 at 11:00  Please call the care guide team at [phone] if you need to cancel or reschedule your appointment.   If you are experiencing a Mental Health or [place] or need someone to talk to, please call 1-800-273-TALK (toll free, 24 hour hotline)  Patient verbalizes understanding of instructions and care plan provided today and agrees to view in [place]. Active MyChart status and patient understanding of how to access instructions and care plan via MyChart confirmed with patient.     Telephone follow up appointment with care management team member scheduled for:12/5  Daneen Schick, [place], CDP Social Worker, Certified Dementia Practitioner [place] Coordination [phone]

## 2022-07-23 NOTE — Patient Outreach (Signed)
  Care Coordination   Follow Up Visit Note   07/23/2022 Name: Billy [name] MRN: [id number] DOB: Jan 20, 1966  Billy [name] is a 56 y.o. year old male who sees [place], Annie Main, MD for primary care. I spoke with  Billy [name] by phone today.  What matters to the patients health and wellness today?  I want more insulation under my home    Goals Addressed             This Visit's Progress    Care Coordination Activities       Care Coordination Interventions: Determined the patient has not yet received information from [employer] regarding resources he may qualify for since he is not eligible for their program Discussed the patient plans to speak with [place] regarding ramp repair Reviewed patients main concern surrounds around energy efficiency and adding more insulation under his home Verbal and written education provided to the patient about the [employer] program Scheduled follow up over the next month to confirm receipt of mailed resource          SDOH assessments and interventions completed:  No     Care Coordination Interventions Activated:  Yes  Care Coordination Interventions:  Yes, provided   Follow up plan: Follow up call scheduled for 12/5    Encounter Outcome:  Pt. Visit Completed   Daneen Schick, [place], CDP Social Worker, Certified Dementia Practitioner [place] Management  Care Coordination [phone]

## 2022-08-18 ENCOUNTER — Telehealth: Payer: Self-pay

## 2022-08-18 NOTE — Patient Outreach (Signed)
  Care Coordination   08/18/2022 Name: EBONY RICKEL MRN: [id number] DOB: 14-Feb-1966   Care Coordination Outreach Attempts:  An unsuccessful telephone outreach was attempted for a scheduled appointment today.  Follow Up Plan:  Additional outreach attempts will be made to offer the patient care coordination information and services.   Encounter Outcome:  No Answer   Care Coordination Interventions:  No, not indicated    Daneen Schick, BSW, CDP Social Worker, Certified Dementia Practitioner [place] Management  Care Coordination [phone]

## 2022-08-21 ENCOUNTER — Ambulatory Visit (INDEPENDENT_AMBULATORY_CARE_PROVIDER_SITE_OTHER): Payer: Medicare Other | Admitting: Podiatry

## 2022-08-21 VITALS — BP 129/94 | HR 68

## 2022-08-21 DIAGNOSIS — B351 Tinea unguium: Secondary | ICD-10-CM | POA: Diagnosis not present

## 2022-08-21 NOTE — Patient Instructions (Signed)
If the nails gets thick you can also use "urea nail gel"

## 2022-08-21 NOTE — Progress Notes (Unsigned)
Subjective:   Patient ID: Billy [name], male   DOB: 56 y.o.   MRN: [id number]   HPI Chief Complaint  Patient presents with   Diabetes    Diabetic foot care, A1c-5.9 BG- 166, Nail trim TX: Lamisil (needs a refill)      56 year old male presents with above concerns.  Feels that the Lamisil has been helpful however the left big toe nail still thickened and discolored causing discomfort at times.  No swelling redness or drainage.  He is also still using Vicks vapor rub.  No open lesions.  States he has neuropathy.   Review of Systems  All other systems reviewed and are negative.    Objective:  Physical Exam  General: AAO x3, NAD  Dermatological: Nails in general hypertrophic, dystrophic yellow, brown discoloration in particular left hallux with the most dystrophic and discolored.  They do appear to be better.  Is able to debride the left hallux nail this is loosened almost the entire toenail came off manage new, healthy toenail growing in.  No drainage or pus or any swelling redness or any drainage.  There is no open lesions.  Vascular: Dorsalis Pedis artery and Posterior Tibial artery pedal pulses are 2/4 bilateral with immedate capillary fill time.There is no pain with calf compression, swelling, warmth, erythema.   Neruologic: Sensation mildly decreased with Semmes Weinstein monofilament.  Musculoskeletal: Muscular strength 5/5 in all groups tested bilateral.     Assessment:   Onychomycosis     Plan:  -Treatment options discussed including all alternatives, risks, and complications -Etiology of symptoms were discussed -With a course of Lamisil.  Is able to sharply with a nail without any complications or bleeding as a courtesy in the left nail came off there is new, healthy nail present.  Continue with topical medication.  I discussed urea nail gel to help with the thickening as well if needed but overall he is doing much better.  Trula Slade DPM

## 2022-08-22 DIAGNOSIS — B351 Tinea unguium: Secondary | ICD-10-CM | POA: Insufficient documentation

## 2022-08-24 ENCOUNTER — Telehealth: Payer: Self-pay

## 2022-08-24 NOTE — Patient Outreach (Signed)
  Care Coordination   08/24/2022 Name: Billy [name] MRN: [id number] DOB: [birth year]   Care Coordination Outreach Attempts:  A second unsuccessful outreach was attempted today to offer the patient with information about available care coordination services as a benefit of their health plan.     Follow Up Plan:  Additional outreach attempts will be made to offer the patient care coordination information and services.   Encounter Outcome:  No Answer   Care Coordination Interventions:  No, not indicated    Daneen Schick, BSW, CDP Social Worker, Certified Dementia Practitioner [place] Management  Care Coordination [phone]

## 2022-08-26 ENCOUNTER — Telehealth: Payer: Self-pay | Admitting: *Deleted

## 2022-08-26 NOTE — Progress Notes (Unsigned)
  Care Coordination Note  08/26/2022 Name: Billy [name] MRN: [id number] DOB: 1966-07-05  Billy [name] is a 56 y.o. year old male who is a primary care patient of [place], Annie Main, MD and is actively engaged with the care management team. I reached out to Billy [name] by phone today to assist with re-scheduling a follow up visit with the RN Case Manager  Follow up plan: Unsuccessful telephone outreach attempt made. A HIPAA compliant phone message was left for the patient providing contact information and requesting a return call.   [place]  Direct Dial: [phone]

## 2022-08-27 NOTE — Progress Notes (Signed)
  Care Coordination Note  08/27/2022 Name: Billy [name] MRN: [id number] DOB: Feb 27, 1966  Billy [name] is a 56 y.o. year old male who is a primary care patient of [place], Annie Main, MD and is actively engaged with the care management team. I reached out to Billy [name] by phone today to assist with re-scheduling a follow up visit with the RN Case Manager and BSW  Follow up plan: Telephone appointment with care management team member scheduled for:09/15/21 @ 11:00 AM  with Tillie Rung and 09/22/21 at 11:00 AM with [place]  Direct Dial: [phone]

## 2022-09-15 ENCOUNTER — Ambulatory Visit: Payer: Self-pay

## 2022-09-15 NOTE — Patient Instructions (Signed)
Visit Information  Thank you for taking time to visit with me today. Please don't hesitate to contact me if I can be of assistance to you.   Following are the goals we discussed today:   Goals Addressed             This Visit's Progress    COMPLETED: Care Coordination Activities       Care Coordination Interventions: Determined patient has yet to receive mailed resource information on [place] program with patient verbally and resent mailed resource information Advised patient to contact SW as needed         If you are experiencing a Mental Health or [place] or need someone to talk to, please call 1-800-273-TALK (toll free, 24 hour hotline) call 911  Patient verbalizes understanding of instructions and care plan provided today and agrees to view in [place]. Active MyChart status and patient understanding of how to access instructions and care plan via MyChart confirmed with patient.     No further follow up required: Please contact me as needed.  Daneen Schick, BSW, CDP Social Worker, Certified Dementia Practitioner [place] [facility]  Care Coordination [phone]

## 2022-09-15 NOTE — Patient Outreach (Signed)
  Care Coordination   Follow Up Visit Note   09/15/2022 Name: Billy [name] MRN: [id number] DOB: [birth year]  Billy [name] is a 57 y.o. year old male who sees [place], Annie Main, MD for primary care. I spoke with  Billy [name] by phone today.  What matters to the patients health and wellness today?  Resource follow up    Goals Addressed             This Visit's Progress    COMPLETED: Care Coordination Activities       Care Coordination Interventions: Determined patient has yet to receive mailed resource information on [place] program with patient verbally and resent mailed resource information Advised patient to contact SW as needed         SDOH assessments and interventions completed:  No     Care Coordination Interventions:  Yes, provided   Follow up plan: No further intervention required.   Encounter Outcome:  Pt. Visit Completed   Daneen Schick, BSW, CDP Social Worker, Certified Dementia Practitioner [place] [facility]  Care Coordination [phone]

## 2022-09-22 ENCOUNTER — Ambulatory Visit: Payer: Self-pay

## 2022-09-22 NOTE — Patient Outreach (Signed)
  Care Coordination   09/22/2022 Name: DANYON MCGINNESS MRN: [id number] DOB: [birth year]   Care Coordination Outreach Attempts:  An unsuccessful telephone outreach was attempted for a scheduled appointment today.  Follow Up Plan:  Additional outreach attempts will be made to offer the patient care coordination information and services.   Encounter Outcome:  No Answer   Care Coordination Interventions:  No, not indicated    Barb Merino, RN, BSN, CCM Care Management Coordinator [facility] Care Management Direct Phone: [phone]

## 2022-09-23 ENCOUNTER — Telehealth: Payer: Self-pay | Admitting: *Deleted

## 2022-09-23 NOTE — Progress Notes (Signed)
  Care Coordination Note  09/23/2022 Name: ALLARD LIGHTSEY MRN: [id number] DOB: September 11, 1966  [name] is a 57 y.o. year old male who is a primary care patient of [place], Annie Main, MD and is actively engaged with the care management team. I reached out to [name] by phone today to assist with re-scheduling a follow up visit with the RN Case Manager  Follow up plan: A second unsuccessful telephone outreach attempt made. A HIPAA compliant phone message was left for the patient providing contact information and requesting a return call.  [place]  Direct Dial: [phone]

## 2022-09-25 NOTE — Progress Notes (Signed)
  Care Coordination Note  09/25/2022 Name: Billy [name] MRN: [id number] DOB: 11-04-1965  Billy [name] is a 57 y.o. year old male who is a primary care patient of [place], Annie Main, MD and is actively engaged with the care management team. I reached out to Billy [name] by phone today to assist with re-scheduling a follow up visit with the RN Case Manager  Follow up plan: Telephone appointment with care management team member scheduled for:10/13/22  [place]  Direct Dial: [phone]

## 2022-10-12 ENCOUNTER — Other Ambulatory Visit: Payer: Self-pay

## 2022-10-12 NOTE — Patient Outreach (Signed)
  Care Coordination Note  10/12/2022 Name: GILLIAN KLUEVER MRN: [id number] DOB: Jul 30, 1966  [name] is a 57 y.o. year old male who is a primary care patient of [place], Annie Main, MD and is actively engaged with the care management team. [name] called by phone today for re-scheduling a follow up visit with the RN Case Manager. Rescheduled patient.   [place] Management Assistant [phone]

## 2022-10-15 ENCOUNTER — Ambulatory Visit: Payer: Self-pay

## 2022-10-15 NOTE — Patient Outreach (Signed)
  Care Coordination   Follow Up Visit Note   10/15/2022 Name: ANDREAS SOBOLEWSKI MRN: [id number] DOB: [birth year]  [name] is a 57 y.o. year old male who sees [place], Annie Main, MD for primary care. I spoke with  [name] by phone today.  What matters to the patients health and wellness today?  Patient will continue to keep all scheduled MD appointments. His goal is to come off Plavix.     Goals Addressed               This Visit's Progress     Patient Stated     COMPLETED: I will call my Cardiologist to confirm my medication dosage (pt-stated)        Care Coordination Interventions: Evaluation of current treatment plan related to hypertension self management and patient's adherence to plan as established by provider Determined patient is taking his medications as prescribed and is self monitoring his BP at home Discussed patient's goal is to come off Plavix, he plans to discuss this further with his Cardiologist at next scheduled visit Reviewed scheduled/upcoming provider appointment including: next Cardiology follow up with Dr. Oval Linsey scheduled for 10/21/22 '@09'$ :40 AM         SDOH assessments and interventions completed:  No     Care Coordination Interventions:  Yes, provided   Follow up plan: No further intervention required.   Encounter Outcome:  Pt. Visit Completed

## 2022-10-15 NOTE — Patient Instructions (Signed)
Visit Information  Thank you for taking time to visit with me today. Please don't hesitate to contact me if I can be of assistance to you.   Following are the goals we discussed today:   Goals Addressed               This Visit's Progress     Patient Stated     COMPLETED: I will call my Cardiologist to confirm my medication dosage (pt-stated)        Care Coordination Interventions: Evaluation of current treatment plan related to hypertension self management and patient's adherence to plan as established by provider Determined patient is taking his medications as prescribed and is self monitoring his BP at home Discussed patient's goal is to come off Plavix, he plans to discuss this further with his Cardiologist at next scheduled visit Reviewed scheduled/upcoming provider appointment including: next Cardiology follow up with Dr. Oval Linsey scheduled for 10/21/22 '@09'$ :40 AM         If you are experiencing a Mental Health or [place] or need someone to talk to, please go to [facility] [address] [place] [phone])  Patient verbalizes understanding of instructions and care plan provided today and agrees to view in [place]. Active MyChart status and patient understanding of how to access instructions and care plan via MyChart confirmed with patient.     Barb Merino, RN, BSN, CCM Care Management Coordinator [facility] Care Management Direct Phone: [phone] '

## 2022-10-21 ENCOUNTER — Ambulatory Visit (HOSPITAL_BASED_OUTPATIENT_CLINIC_OR_DEPARTMENT_OTHER): Payer: Medicare Other | Admitting: Cardiovascular Disease

## 2022-10-22 ENCOUNTER — Encounter (HOSPITAL_COMMUNITY): Payer: Self-pay | Admitting: *Deleted

## 2022-11-08 ENCOUNTER — Other Ambulatory Visit: Payer: Self-pay | Admitting: Cardiovascular Disease

## 2022-11-09 NOTE — Telephone Encounter (Signed)
Rx(s) sent to pharmacy electronically.  

## 2022-12-21 ENCOUNTER — Ambulatory Visit (INDEPENDENT_AMBULATORY_CARE_PROVIDER_SITE_OTHER): Payer: 59 | Admitting: Podiatry

## 2022-12-21 DIAGNOSIS — M79675 Pain in left toe(s): Secondary | ICD-10-CM

## 2022-12-21 DIAGNOSIS — B351 Tinea unguium: Secondary | ICD-10-CM

## 2022-12-21 DIAGNOSIS — E1149 Type 2 diabetes mellitus with other diabetic neurological complication: Secondary | ICD-10-CM

## 2022-12-21 DIAGNOSIS — M79674 Pain in right toe(s): Secondary | ICD-10-CM

## 2022-12-21 NOTE — Progress Notes (Signed)
Subjective:   Patient ID: Billy [name], male   DOB: 57 y.o.   MRN: [id number]   HPI Chief Complaint  Patient presents with   Nail Problem    Nail trim    57 year old male presents with above concerns. Nails are growing out with fungus is getting better.  They do get uncomfortable thickened elongated.  No swelling redness or any drainage.  No other concerns.  States he has neuropathy.   Review of Systems  All other systems reviewed and are negative.    Objective:  Physical Exam  General: AAO x3, NAD  Dermatological: Nails in general hypertrophic, dystrophic yellow, brown discoloration in particular left hallux with the most dystrophic and discolored.  They do appear to be better the nails are growing is clearing on the proximal nail fold.  There are some dried blood present distal aspect of right second toenail.  Upon debridement is able to remove dried blood.  There is no extension of hyperpigmentation of the surrounding skin.  Nails are tender 1 through 5 on left and 1, 3, 4, 5 on the right.  Vascular: Dorsalis Pedis artery and Posterior Tibial artery pedal pulses are 2/4 bilateral with immedate capillary fill time.There is no pain with calf compression, swelling, warmth, erythema.   Neruologic: Sensation mildly decreased with Semmes Weinstein monofilament.  Musculoskeletal: Muscular strength 5/5 in all groups tested bilateral.     Assessment:   Symptomatic onychomycosis     Plan:  -Treatment options discussed including all alternatives, risks, and complications -Etiology of symptoms were discussed -Sharp debridement nails x 9 without any complications or bleeding. Will hold off on further Lamisil for now but if symptoms do not improve the next appointment will likely reconsider another round of this.  Vivi Barrack DPM

## 2022-12-21 NOTE — Patient Instructions (Signed)
For shoes I would recommend going to [employer]

## 2023-01-20 ENCOUNTER — Other Ambulatory Visit: Payer: Self-pay | Admitting: Cardiovascular Disease

## 2023-02-23 ENCOUNTER — Other Ambulatory Visit: Payer: Self-pay | Admitting: Cardiovascular Disease

## 2023-02-24 NOTE — Telephone Encounter (Signed)
Rx request sent to pharmacy.  

## 2023-02-24 NOTE — Telephone Encounter (Signed)
This message was in error.

## 2023-02-24 NOTE — Telephone Encounter (Signed)
Scheduled 08/20/23 with Dr. Duke Salvia

## 2023-02-24 NOTE — Telephone Encounter (Signed)
I forwarded this to Selena Zobro---it is [facility]

## 2023-02-24 NOTE — Telephone Encounter (Signed)
Please call pt to schedule overdue follow-up appointment with Dr. Duke Salvia or APP for refills. Last OV 09/2021. Thank you!

## 2023-03-22 ENCOUNTER — Ambulatory Visit: Payer: 59 | Admitting: Podiatry

## 2023-04-12 IMAGING — DX DG ABD PORTABLE 1V
1 series · 1 of 1 positions shown · non-contrast
Comparison: 07/17/2021

CLINICAL DATA: Feeding tube placement

EXAM:
PORTABLE ABDOMEN - 1 VIEW

[abdomen]
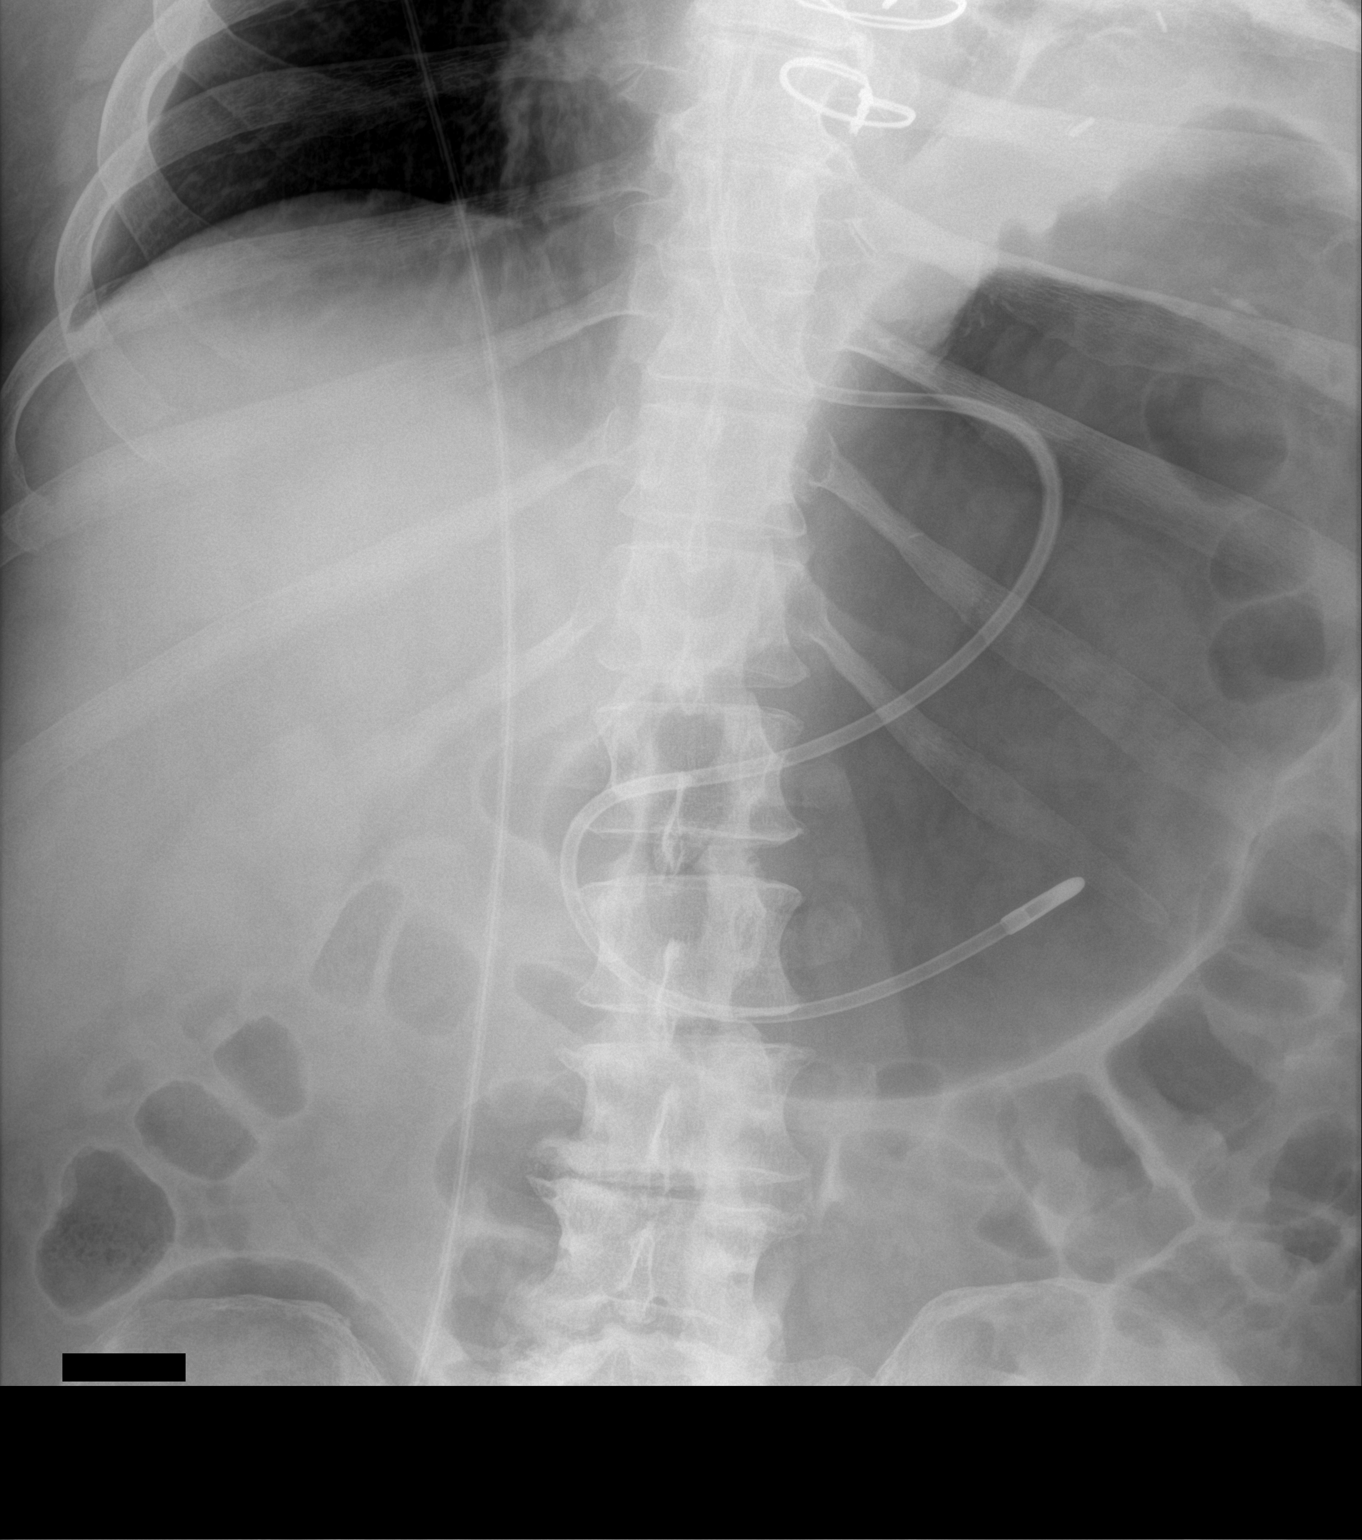

[1 of 1 positions shown; findings below may reference images not displayed]

FINDINGS: There is gaseous distention of stomach. Enteric tube is noted
following the course 3rd to 4th portion duodenum. There is no
significant small bowel dilation. Gas is present in colon.
Degenerative changes are noted in lumbar spine. Left hemidiaphragm
is elevated.
IMPRESSION: There is no significant change in position of enteric tube with its
tip in the region of 3rd to 4th portion of duodenum.There is gaseous
distention of stomach. There is no significant small bowel dilation.

## 2023-04-14 IMAGING — DX DG CHEST 1V PORT
1 series · 1 of 1 positions shown · non-contrast
Comparison: 07/18/2021 chest radiograph.

CLINICAL DATA: Status post CABG

EXAM:
PORTABLE CHEST 1 VIEW

[chest]
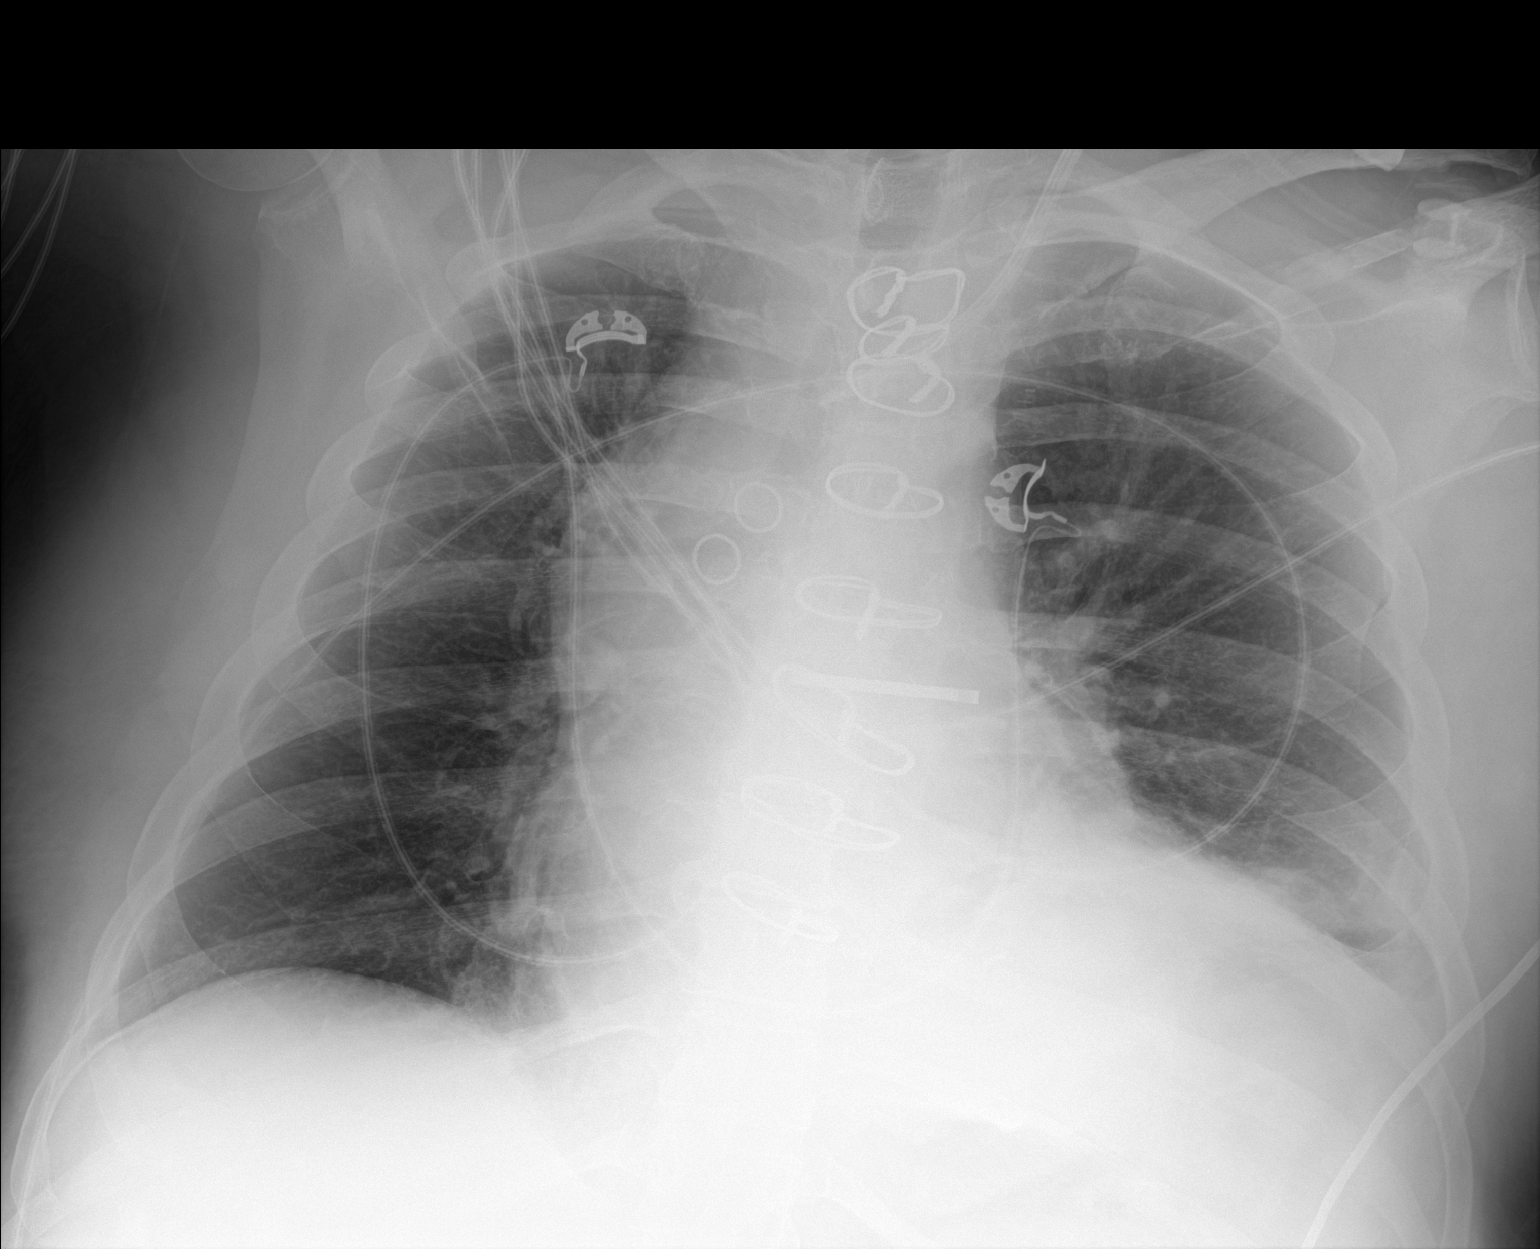

[1 of 1 positions shown; findings below may reference images not displayed]

FINDINGS: Intact sternotomy wires. Left internal jugular central venous
catheter terminates over the left brachiocephalic vein and just to
the right of midline, unchanged. Stable cardiomediastinal silhouette
with borderline mild cardiomegaly. No pneumothorax. Possible trace
left pleural effusion. No right pleural effusion. No overt pulmonary
edema. Stable mild elevation of the left hemidiaphragm with left
basilar atelectasis.
IMPRESSION: 1. Stable borderline mild cardiomegaly without overt pulmonary
edema.
2. Possible trace left pleural effusion.
3. Stable mild elevation of the left hemidiaphragm with left basilar
atelectasis.

## 2023-04-14 IMAGING — DX DG ABDOMEN 1V
1 series · 3 of 3 positions shown · non-contrast
Comparison: 07/18/2021

CLINICAL DATA: Follow-up ileus.

EXAM:
ABDOMEN - 1 VIEW

[Series 1: abdomen · 0.14mm/px · 3 of 3 slices shown]
[im 1/3]
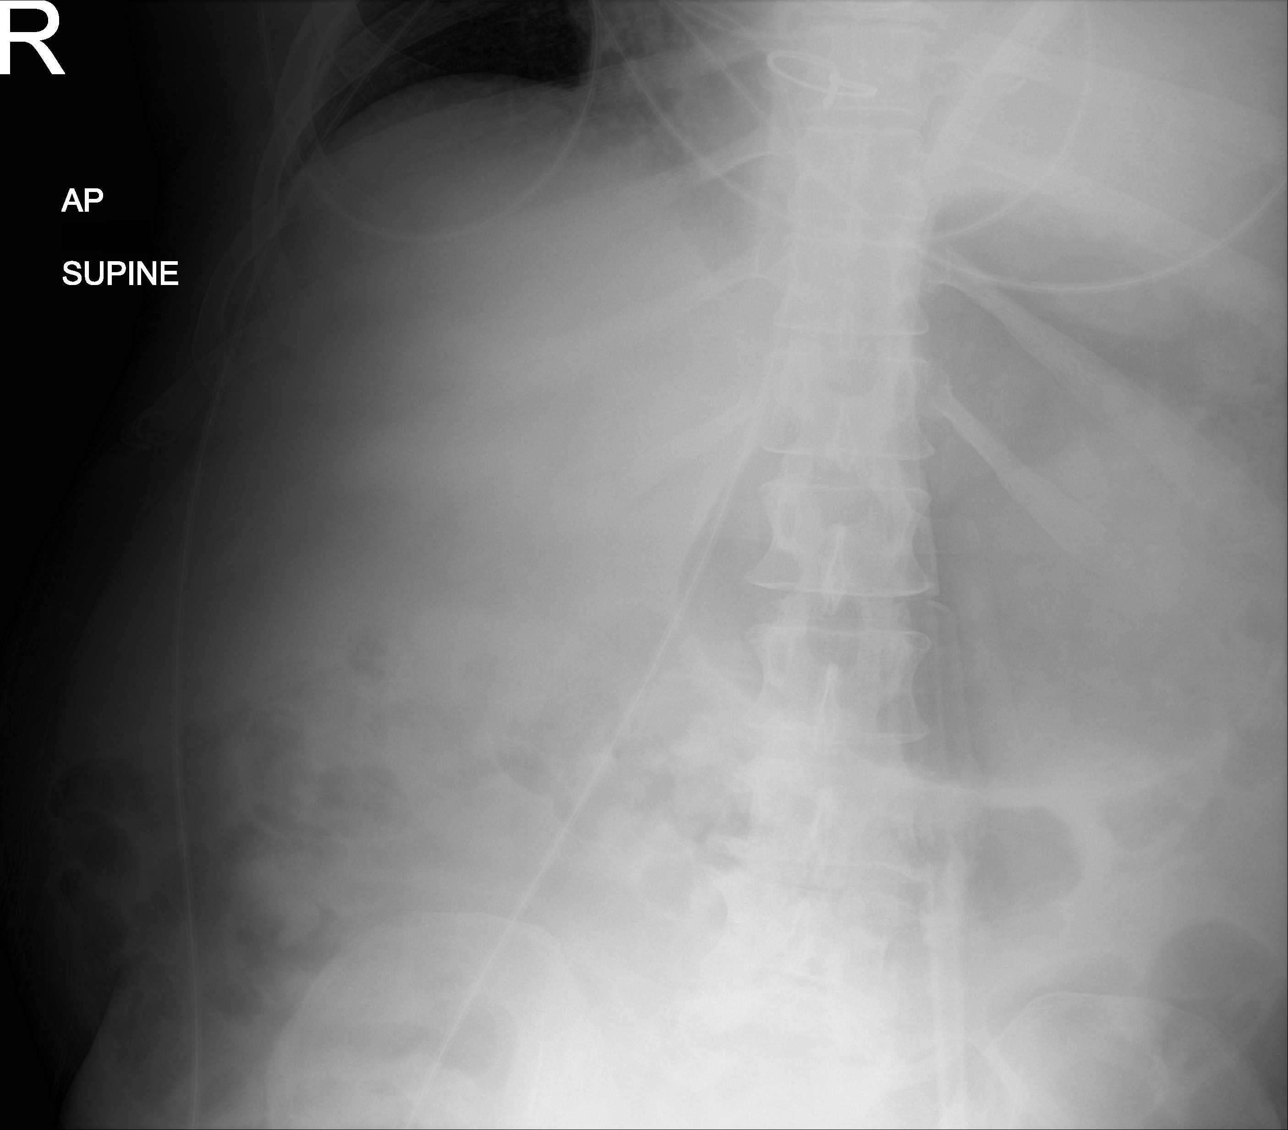
[im 2/3]
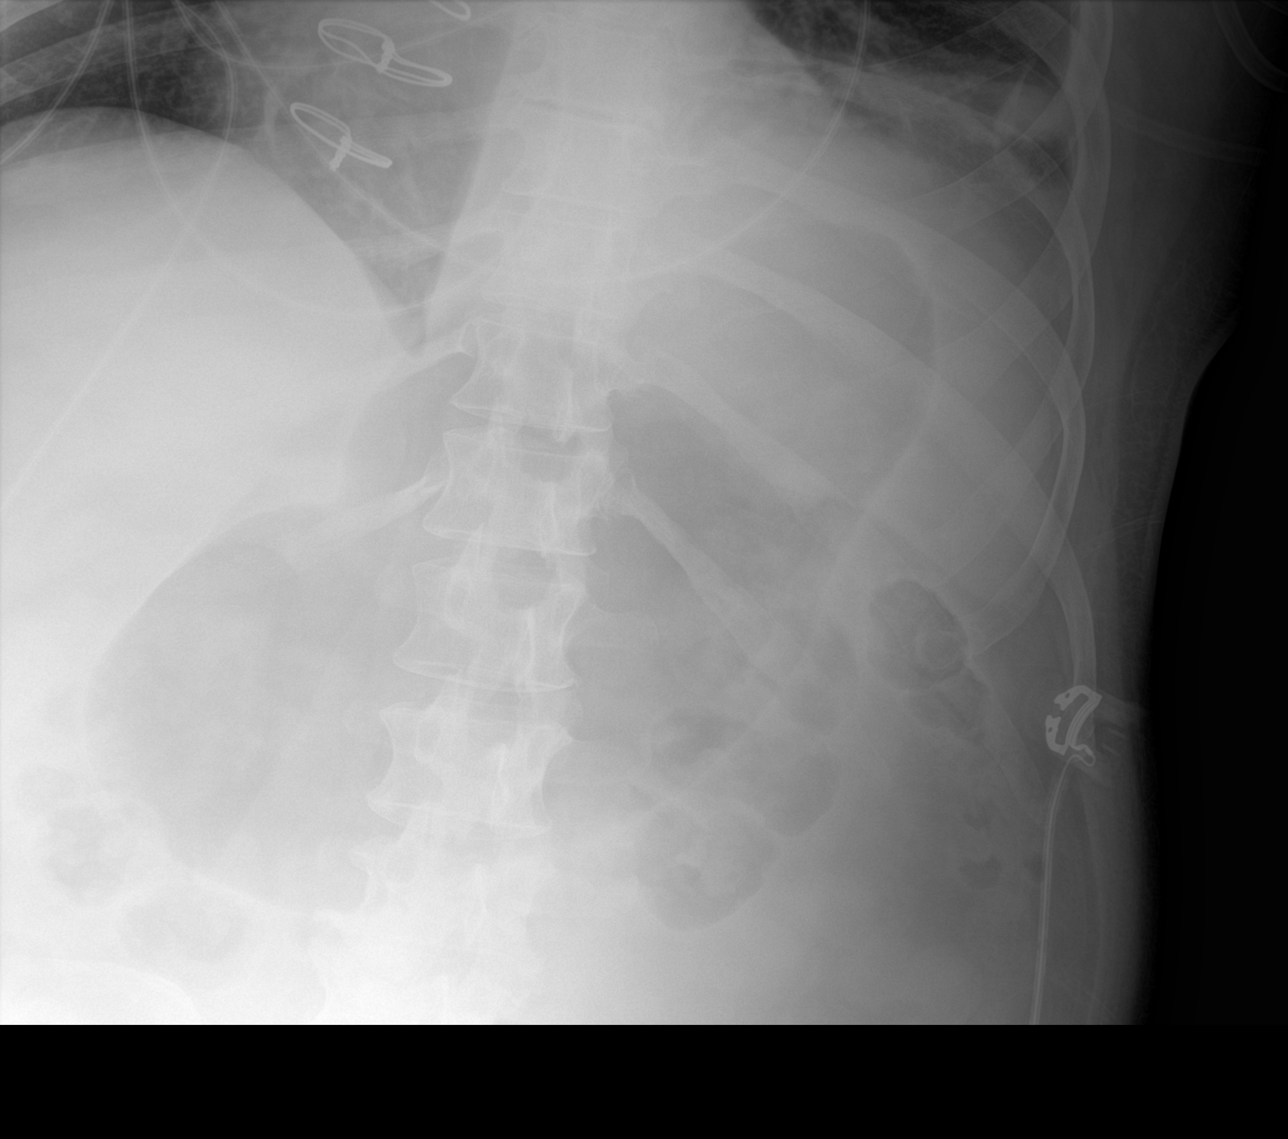
[im 3/3]
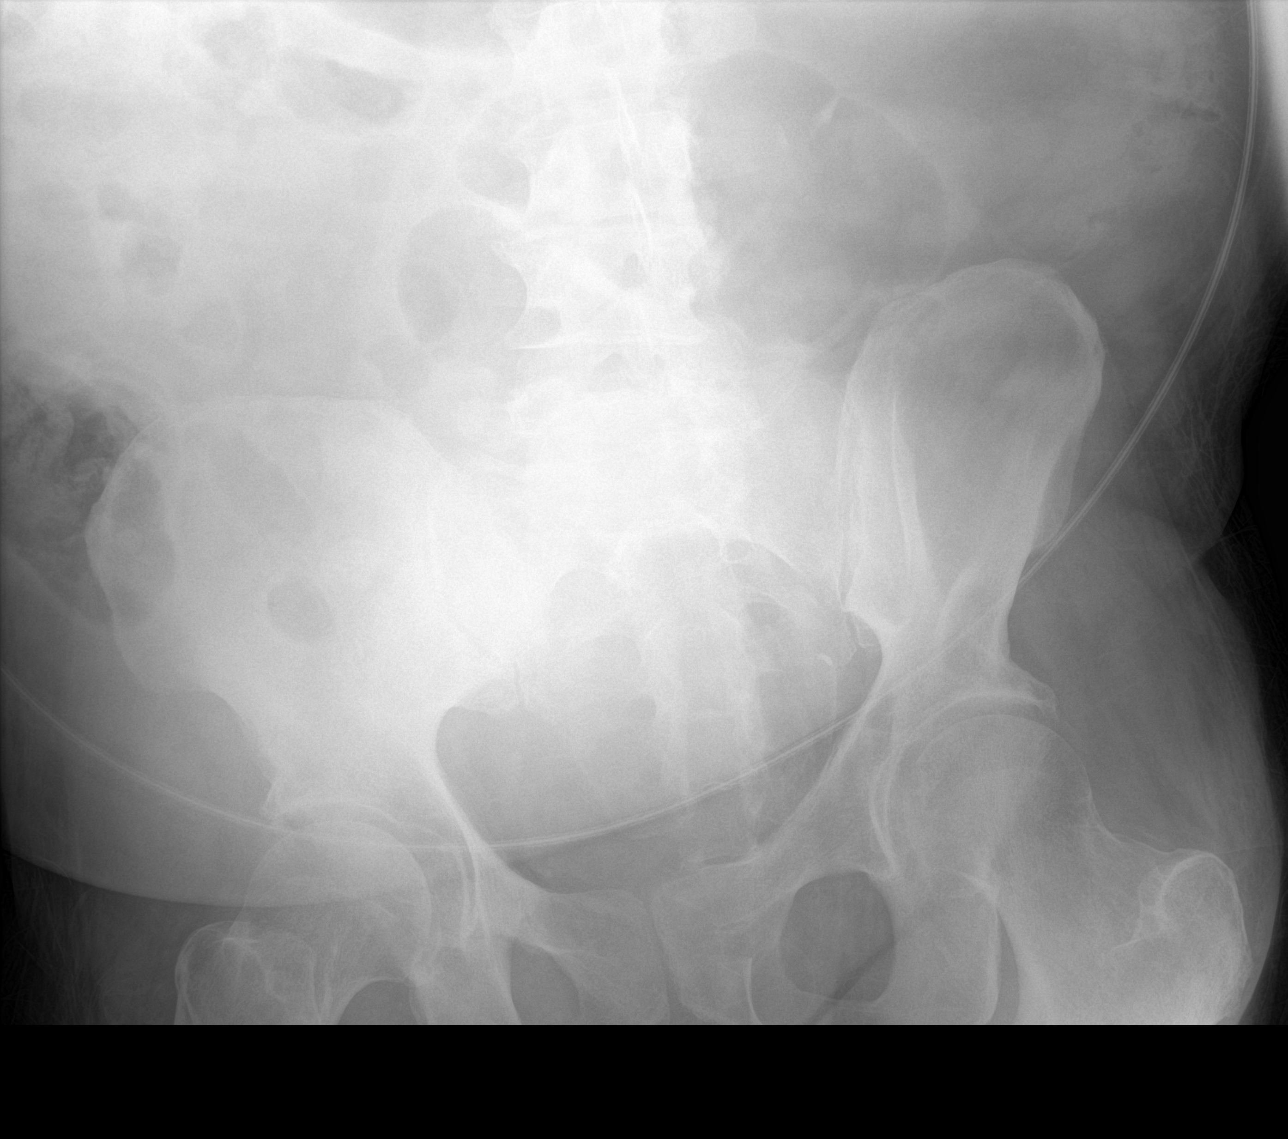

[3 of 3 positions shown; findings below may reference images not displayed]

FINDINGS: Previously seen feeding tube is no longer visualized. Gaseous
distension stomach is noted. No evidence of dilated bowel loops. No
definite radiopaque urinary calculi identified.
IMPRESSION: Gaseous distension of stomach.  No evidence of dilated bowel loops.

## 2023-04-15 ENCOUNTER — Ambulatory Visit (INDEPENDENT_AMBULATORY_CARE_PROVIDER_SITE_OTHER): Payer: 59 | Admitting: Podiatry

## 2023-04-15 ENCOUNTER — Encounter: Payer: Self-pay | Admitting: Podiatry

## 2023-04-15 DIAGNOSIS — M79675 Pain in left toe(s): Secondary | ICD-10-CM

## 2023-04-15 DIAGNOSIS — M79674 Pain in right toe(s): Secondary | ICD-10-CM

## 2023-04-15 DIAGNOSIS — B351 Tinea unguium: Secondary | ICD-10-CM

## 2023-04-15 DIAGNOSIS — Z79899 Other long term (current) drug therapy: Secondary | ICD-10-CM

## 2023-04-15 NOTE — Patient Instructions (Signed)
Terbinafine Tablets What is this medication? TERBINAFINE (TER bin a feen) treats fungal infections of the nails. It belongs to a group of medications called antifungals. It will not treat infections caused by bacteria or viruses. This medicine may be used for other purposes; ask your health care provider or pharmacist if you have questions. COMMON BRAND NAME(S): Lamisil, Terbinex What should I tell my care team before I take this medication? They need to know if you have any of these conditions: Liver disease An unusual or allergic reaction to terbinafine, other medications, foods, dyes, or preservatives Pregnant or trying to get pregnant Breast-feeding How should I use this medication? Take this medication by mouth with water. Take it as directed on the prescription label at the same time every day. You can take it with or without food. If it upsets your stomach, take it with food. Keep taking it unless your care team tells you to stop. A special MedGuide will be given to you by the pharmacist with each prescription and refill. Be sure to read this information carefully each time. Talk to your care team regarding the use of this medication in children. Special care may be needed. Overdosage: If you think you have taken too much of this medicine contact a poison control center or emergency room at once. NOTE: This medicine is only for you. Do not share this medicine with others. What if I miss a dose? If you miss a dose, take it as soon as you can unless it is more than 4 hours late. If it is more than 4 hours late, skip the missed dose. Take the next dose at the normal time. What may interact with this medication? Do not take this medication with any of the following: Pimozide Thioridazine This medication may also interact with the following: Beta blockers Caffeine Certain medications for mental health conditions Cimetidine Cyclosporine Medications for fungal infections like fluconazole  and ketoconazole Medications for irregular heartbeat like amiodarone, flecainide and propafenone Rifampin Warfarin This list may not describe all possible interactions. Give your health care provider a list of all the medicines, herbs, non-prescription drugs, or dietary supplements you use. Also tell them if you smoke, drink alcohol, or use illegal drugs. Some items may interact with your medicine. What should I watch for while using this medication? Visit your care team for regular checks on your progress. You may need blood work while you are taking this medication. It may be some time before you see the benefit from this medication. This medication may cause serious skin reactions. They can happen weeks to months after starting the medication. Contact your care team right away if you notice fevers or flu-like symptoms with a rash. The rash may be red or purple and then turn into blisters or peeling of the skin. Or, you might notice a red rash with swelling of the face, lips or lymph nodes in your neck or under your arms. This medication can make you more sensitive to the sun. Keep out of the sun, If you cannot avoid being in the sun, wear protective clothing and sunscreen. Do not use sun lamps or tanning beds/booths. What side effects may I notice from receiving this medication? Side effects that you should report to your care team as soon as possible: Allergic reactions--skin rash, itching, hives, swelling of the face, lips, tongue, or throat Change in sense of smell Change in taste Infection--fever, chills, cough, or sore throat Liver injury--right upper belly pain, loss of appetite, nausea,   light-colored stool, dark yellow or brown urine, yellowing skin or eyes, unusual weakness or fatigue Low red blood cell level--unusual weakness or fatigue, dizziness, headache, trouble breathing Lupus-like syndrome--joint pain, swelling, or stiffness, butterfly-shaped rash on the face, rashes that get worse  in the sun, fever, unusual weakness or fatigue Rash, fever, and swollen lymph nodes Redness, blistering, peeling, or loosening of the skin, including inside the mouth Unusual bruising or bleeding Worsening mood, feelings of depression Side effects that usually do not require medical attention (report to your care team if they continue or are bothersome): Diarrhea Gas Headache Nausea Stomach pain Upset stomach This list may not describe all possible side effects. Call your doctor for medical advice about side effects. You may report side effects to FDA at 1-800-FDA-1088. Where should I keep my medication? Keep out of the reach of children and pets. Store between 20 and 25 degrees C (68 and 77 degrees F). Protect from light. Get rid of any unused medication after the expiration date. To get rid of medications that are no longer needed or have expired: Take the medication to a medication take-back program. Check with your pharmacy or law enforcement to find a location. If you cannot return the medication, check the label or package insert to see if the medication should be thrown out in the garbage or flushed down the toilet. If you are not sure, ask your care team. If it is safe to put it in the trash, take the medication out of the container. Mix the medication with cat litter, dirt, coffee grounds, or other unwanted substance. Seal the mixture in a bag or container. Put it in the trash. NOTE: This sheet is a summary. It may not cover all possible information. If you have questions about this medicine, talk to your doctor, pharmacist, or health care provider.  2024 Elsevier/Gold Standard (2021-04-16 00:00:00)  

## 2023-04-15 NOTE — Progress Notes (Signed)
Subjective:   Patient ID: Billy [name], male   DOB: 57 y.o.   MRN: [id number]   HPI Chief Complaint  Patient presents with   bilateral toe nail trim    57 year old male presents with above concerns. Nails are growing out with fungus is getting better, but still there. No swelling, redness or any signs of infection. The nails do become tender as they get elongated.   States he has neuropathy.   Review of Systems  All other systems reviewed and are negative.    Objective:  Physical Exam  General: AAO x3, NAD  Dermatological: Nails in general hypertrophic, dystrophic yellow, brown discoloration in particular left hallux with the most dystrophic and discolored.  There are some clearing on the proximal nail fold however appears to . there is no edema, erythema or signs of infection of the toenail sites.  Nails are tender 1 through 5 on left and 1, 3, 4, 5 on the right.  Vascular: Dorsalis Pedis artery and Posterior Tibial artery pedal pulses are 2/4 bilateral with immedate capillary fill time.There is no pain with calf compression, swelling, warmth, erythema.   Neruologic: Sensation mildly decreased with Semmes Weinstein monofilament.  Musculoskeletal: Muscular strength 5/5 in all groups tested bilateral.     Assessment:   Symptomatic onychomycosis     Plan:  -Treatment options discussed including all alternatives, risks, and complications -Etiology of symptoms were discussed -Sharp debridement nails x 9 without any complications or bleeding.  -Discussed restarting Lamisil.  He would like to do this.  Monitor for any side effects.  Recheck CBC and LFT.  Vivi Barrack DPM

## 2023-04-16 IMAGING — XA IR TUNNELED CV CATH W/O PORT/PUMP >5YO
1 series · 1 of 1 positions shown · non-contrast
Comparison: none

INDICATION: 55-year-old with acute kidney injury. Request for a tunneled
dialysis catheter. Patient currently has a non tunneled left jugular
catheter.

[Series 1: single · 1 of 1 slices shown]
[im 1/1]
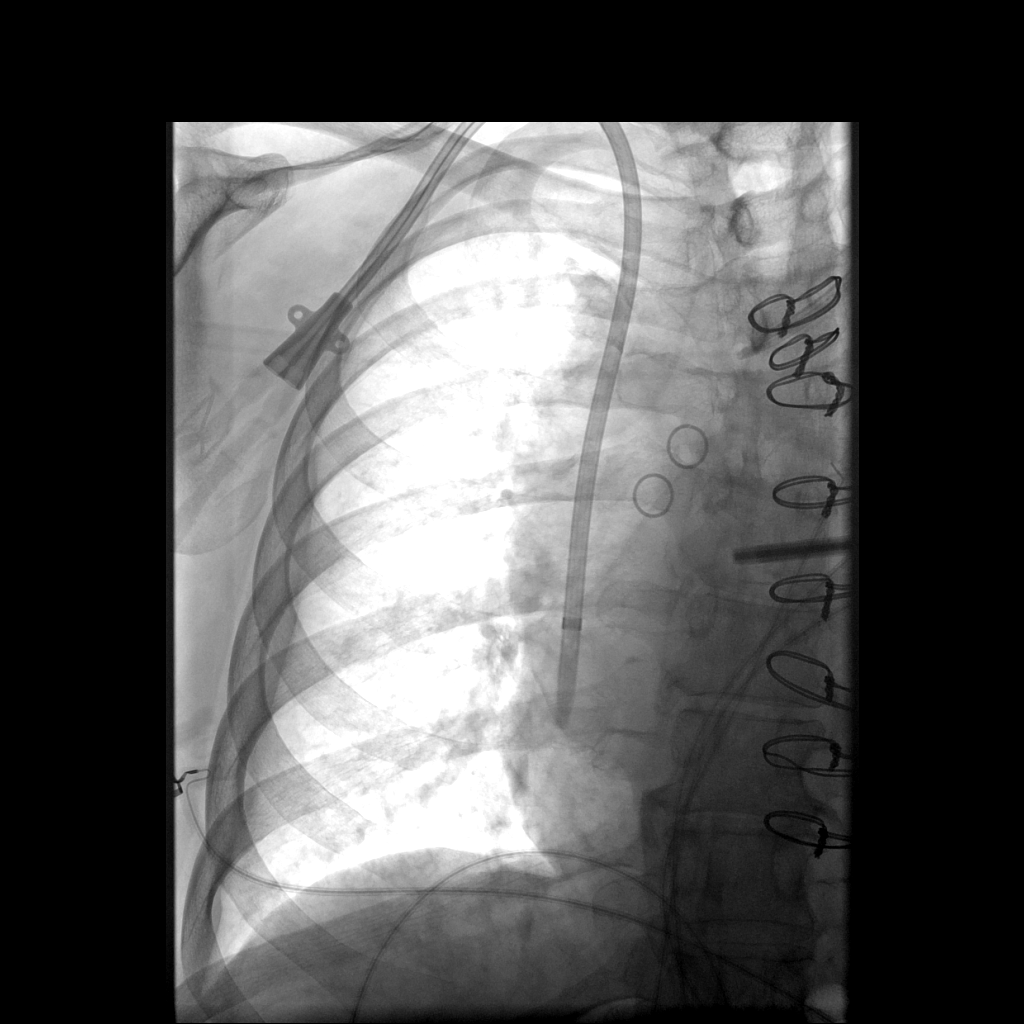

[1 of 1 positions shown; findings below may reference images not displayed]

EXAM:
FLUOROSCOPIC AND ULTRASOUND GUIDED PLACEMENT OF A TUNNELED DIALYSIS
CATHETER

MEDICATIONS:
Ancef 2 g; The antibiotic was administered within an appropriate
time interval prior to skin puncture.

ANESTHESIA/SEDATION:
Moderate (conscious) sedation was employed during this procedure. A
total of Versed 2.0mg and fentanyl 100 mcg was administered
intravenously at the order of the provider performing the procedure.

Total intra-service moderate sedation time: 25 minutes.

Patient's level of consciousness and vital signs were monitored
continuously by radiology nurse throughout the procedure under the
supervision of the provider performing the procedure.

FLUOROSCOPY TIME:  Fluoroscopy Time: 1 minute, 30 seconds, 13 mGy

COMPLICATIONS:
None immediate.

PROCEDURE:
Informed consent was obtained for placement of a tunneled dialysis
catheter. The patient was placed supine on the interventional table.
Ultrasound confirmed a patent right internal jugular vein.
Ultrasound image obtained for documentation. The right neck and
chest was prepped and draped in a sterile fashion. Maximal barrier
sterile technique was utilized including caps, mask, sterile gowns,
sterile gloves, sterile drape, hand hygiene and skin antiseptic. The
right neck was anesthetized with 1% lidocaine. A small incision was
made with #11 blade scalpel. A 21 gauge needle directed into the
right internal jugular vein with ultrasound guidance. A
micropuncture dilator set was placed. A 23 cm tip to cuff Palindrome
catheter was selected. The skin below the right clavicle was
anesthetized and a small incision was made with an #11 blade
scalpel. A subcutaneous tunnel was formed to the vein dermatotomy
site. The catheter was brought through the tunnel. The vein
dermatotomy site was dilated to accommodate a peel-away sheath. The
catheter was placed through the peel-away sheath and directed into
the central venous structures. The tip of the catheter was placed at
superior cavoatrial junction with fluoroscopy. Fluoroscopic images
were obtained for documentation. Both lumens were found to aspirate
and flush well. The proper amount of heparin was flushed in both
lumens. The vein dermatotomy site was closed using a single layer of
absorbable suture and Dermabond. The catheter was secured to the
skin using Prolene suture.
IMPRESSION: Successful placement of a right jugular tunneled dialysis catheter
using ultrasound and fluoroscopic guidance.

## 2023-06-02 ENCOUNTER — Other Ambulatory Visit: Payer: Self-pay | Admitting: Cardiovascular Disease

## 2023-06-28 ENCOUNTER — Ambulatory Visit: Payer: 59 | Admitting: Podiatry

## 2023-07-29 ENCOUNTER — Other Ambulatory Visit: Payer: Self-pay | Admitting: Cardiovascular Disease

## 2023-07-30 ENCOUNTER — Ambulatory Visit (INDEPENDENT_AMBULATORY_CARE_PROVIDER_SITE_OTHER): Payer: 59 | Admitting: Podiatry

## 2023-07-30 ENCOUNTER — Encounter: Payer: Self-pay | Admitting: Podiatry

## 2023-07-30 DIAGNOSIS — B351 Tinea unguium: Secondary | ICD-10-CM | POA: Diagnosis not present

## 2023-07-30 DIAGNOSIS — M79675 Pain in left toe(s): Secondary | ICD-10-CM | POA: Diagnosis not present
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
## 2023-08-05 ENCOUNTER — Other Ambulatory Visit: Payer: Self-pay | Admitting: Cardiovascular Disease

## 2023-08-20 ENCOUNTER — Ambulatory Visit (HOSPITAL_BASED_OUTPATIENT_CLINIC_OR_DEPARTMENT_OTHER): Payer: 59 | Admitting: Cardiovascular Disease

## 2023-08-20 ENCOUNTER — Other Ambulatory Visit: Payer: Self-pay | Admitting: Cardiovascular Disease

## 2023-08-25 LAB — OPHTHALMOLOGY REPORT-SCANNED

## 2023-09-15 ENCOUNTER — Other Ambulatory Visit: Payer: Self-pay

## 2023-09-15 ENCOUNTER — Emergency Department (HOSPITAL_COMMUNITY)
Admission: EM | Admit: 2023-09-15 | Discharge: 2023-09-15 | Disposition: A | Discharge status: Home / Self Care | Payer: 59 | Attending: Emergency Medicine | Admitting: Emergency Medicine

## 2023-09-15 ENCOUNTER — Emergency Department (HOSPITAL_COMMUNITY): Payer: 59

## 2023-09-15 ENCOUNTER — Encounter (HOSPITAL_COMMUNITY): Payer: Self-pay | Admitting: Emergency Medicine

## 2023-09-15 DIAGNOSIS — I509 Heart failure, unspecified: Secondary | ICD-10-CM | POA: Diagnosis not present

## 2023-09-15 DIAGNOSIS — Z7984 Long term (current) use of oral hypoglycemic drugs: Secondary | ICD-10-CM | POA: Diagnosis not present

## 2023-09-15 DIAGNOSIS — Z955 Presence of coronary angioplasty implant and graft: Secondary | ICD-10-CM | POA: Diagnosis not present

## 2023-09-15 DIAGNOSIS — Z794 Long term (current) use of insulin: Secondary | ICD-10-CM | POA: Diagnosis not present

## 2023-09-15 DIAGNOSIS — N189 Chronic kidney disease, unspecified: Secondary | ICD-10-CM | POA: Insufficient documentation

## 2023-09-15 DIAGNOSIS — E1140 Type 2 diabetes mellitus with diabetic neuropathy, unspecified: Secondary | ICD-10-CM | POA: Diagnosis not present

## 2023-09-15 DIAGNOSIS — R079 Chest pain, unspecified: Secondary | ICD-10-CM | POA: Diagnosis present

## 2023-09-15 DIAGNOSIS — Z7901 Long term (current) use of anticoagulants: Secondary | ICD-10-CM | POA: Diagnosis not present

## 2023-09-15 DIAGNOSIS — I2510 Atherosclerotic heart disease of native coronary artery without angina pectoris: Secondary | ICD-10-CM | POA: Insufficient documentation

## 2023-09-15 DIAGNOSIS — E11319 Type 2 diabetes mellitus with unspecified diabetic retinopathy without macular edema: Secondary | ICD-10-CM | POA: Diagnosis not present

## 2023-09-15 LAB — BASIC METABOLIC PANEL
Anion gap: 10 (ref 5–15)
BUN: 43 mg/dL — ABNORMAL HIGH (ref 6–20)
CO2: 21 mmol/L — ABNORMAL LOW (ref 22–32)
Calcium: 8.7 mg/dL — ABNORMAL LOW (ref 8.9–10.3)
Chloride: 108 mmol/L (ref 98–111)
Creatinine, Ser: 2.99 mg/dL — ABNORMAL HIGH (ref 0.61–1.24)
GFR, Estimated: 23 mL/min — ABNORMAL LOW (ref >60)
Glucose, Bld: 229 mg/dL — ABNORMAL HIGH (ref 70–99)
Potassium: 5 mmol/L (ref 3.5–5.1)
Sodium: 139 mmol/L (ref 135–145)

## 2023-09-15 LAB — CBC
HCT: 45.2 % (ref 39.0–52.0)
Hemoglobin: 15.2 g/dL (ref 13.0–17.0)
MCH: 32.1 pg (ref 26.0–34.0)
MCHC: 33.6 g/dL (ref 30.0–36.0)
MCV: 95.6 fL (ref 80.0–100.0)
Platelets: 194 10*3/uL (ref 150–400)
RBC: 4.73 MIL/uL (ref 4.22–5.81)
RDW: 12.5 % (ref 11.5–15.5)
WBC: 6.4 10*3/uL (ref 4.0–10.5)
nRBC: 0 % (ref 0.0–0.2)

## 2023-09-15 LAB — TROPONIN I (HIGH SENSITIVITY)
Troponin I (High Sensitivity): 26 ng/L — ABNORMAL HIGH (ref <18)
Troponin I (High Sensitivity): 26 ng/L — ABNORMAL HIGH (ref <18)

## 2023-09-15 MED ORDER — FENTANYL CITRATE PF 50 MCG/ML IJ SOSY
50.0000 ug | PREFILLED_SYRINGE | Freq: Once | INTRAMUSCULAR | Status: AC
  Administered 2023-09-15: 50 ug via INTRAVENOUS
  Filled 2023-09-15: qty 1

## 2023-09-15 MED ORDER — ASPIRIN 325 MG PO TABS: 325.0000 mg | ORAL_TABLET | Freq: Every day | ORAL | Status: DC

## 2023-09-15 MED ORDER — LORAZEPAM 2 MG/ML IJ SOLN
1.0000 mg | Freq: Once | INTRAMUSCULAR | Status: AC
  Administered 2023-09-15: 1 mg via INTRAVENOUS
  Filled 2023-09-15: qty 1

## 2023-09-15 NOTE — ED Provider Notes (Signed)
 [place] [facility]GENC[facility] DEPARTMENT AT [facility] Provider Note   CSN: [id number] Arrival date & time: 09/15/23  1302     History  Chief Complaint  Patient presents with   Chest Pain    Billy [name] is a 58 y.o. male with history of SVT, type 2 diabetes, diabetic retinopathy, neuropathy, depression, anxiety, CHF, chronic osteomyelitis of right toe, atrial fibrillation, STEMI, CAD s/p CABG x 4 in 2022, BPH, chronic kidney disease, hyperlipidemia, gout, who presents to the emergency department complaining of chest pain.  Patient states that he just got back from a long trip to [place], was there for about a month.  States that he did not have his Eliquis  or amlodipine  during that time.  He got prescriptions of both yesterday and took them for the first time.  Yesterday he also started having intermittent left-sided chest pain, increasing in frequency and severity.  Feels like pulling across the left side of his chest.  Denies any leg pain or swelling, shortness of breath or cough.  Denies any fever or chills.   Chest Pain      Home Medications Prior to Admission medications   Medication Sig Start Date End Date Taking? Authorizing Provider  alprazolam  (XANAX ) 2 MG tablet Take 1 tablet (2 mg total) by mouth 3 (three) times daily as needed for anxiety. 07/06/17   Samtani, Jai-Gurmukh, MD  apixaban  (ELIQUIS ) 5 MG TABS tablet Take 1 tablet (5 mg total) by mouth 2 (two) times daily. 08/12/21   Vannie Reche RAMAN, NP  atorvastatin  (LIPITOR ) 80 MG tablet Take 1 tablet (80 mg total) by mouth daily. 01/20/22   Hongalgi, Anand D, MD  carvedilol  (COREG ) 6.25 MG tablet TAKE 1 TABLET BY MOUTH TWICE  DAILY WITH A MEAL. PLEASE KEEP  SCHEDULED APPOINTMENT FOR FUTURE REFILLS. THANK YOU 08/23/23   Raford Riggs, MD  Cholecalciferol  (VITAMIN D ) 50 MCG (2000 UT) tablet Take 2,000 Units by mouth daily.    [provider]  clopidogrel  (PLAVIX ) 75 MG tablet Take 1 tablet (75 mg total) by mouth  daily. 08/12/21   Swinyer, Rosaline HERO, NP  Continuous Blood Gluc Sensor (FREESTYLE LIBRE 14 DAY SENSOR) MISC Use to check CBG three times daily.  Change sensore every 14 days. DX:  Z88.680 08/09/17   [provider]  diclofenac Sodium (VOLTAREN) 1 % GEL Apply 1 application. topically 4 (four) times daily as needed for pain. 10/16/21   [provider]  FARXIGA  10 MG TABS tablet TAKE 1 TABLET BY MOUTH DAILY BEFORE BREAKFAST. 05/01/22   Raford Riggs, MD  furosemide  (LASIX ) 20 MG tablet Take 1 tablet (20 mg total) by mouth daily as needed. Patient not taking: Reported on 06/26/2022 02/03/22 02/03/23  Glena Harlene HERO, FNP  guaiFENesin -dextromethorphan  (ROBITUSSIN DM) 100-10 MG/5ML syrup Take 10 mLs by mouth every 4 (four) hours as needed for cough. Patient not taking: Reported on 07/08/2022 01/20/22   Hongalgi, Anand D, MD  hydrALAZINE  (APRESOLINE ) 25 MG tablet Take 0.5 tablets (12.5 mg total) by mouth in the morning and at bedtime. Patient not taking: Reported on 06/26/2022 09/10/21   Raford Riggs, MD  Insulin  Pen Needle (BD PEN NEEDLE NANO 2ND GEN) 32G X 4 MM MISC USE WITH TRESIBA DAILY E11.39    [provider]  Lancets (FREESTYLE) lancets CHECK BLOOD SUGAR 3 TIMES DAILY 10/03/15   [provider]  losartan  (COZAAR ) 25 MG tablet Take 25 mg by mouth daily.    [provider]  MAGNESIUM  CITRATE PO  Take 1 tablet by mouth daily. Patient not taking: Reported on 07/08/2022    [provider]  meclizine  (ANTIVERT ) 12.5 MG tablet Take 1 tablet (12.5 mg total) by mouth 2 (two) times daily as needed for dizziness. Patient not taking: Reported on 07/08/2022 08/06/21   Gold, Wayne E, PA-C  methocarbamol  (ROBAXIN ) 500 MG tablet Take 500 mg by mouth 3 (three) times daily as needed for muscle spasms. 12/03/21   [provider]  OVER THE COUNTER MEDICATION Take 1 tablet by mouth daily as needed (energy). Super Beets supplement    [provider]  terbinafine  (LAMISIL ) 250 MG tablet Take 1 tablet (250 mg total) by mouth daily. 07/21/22   Gershon Donnice SAUNDERS, DPM  traZODone  (DESYREL ) 100 MG tablet Take 100 mg by mouth at bedtime. Patient not taking: Reported on 07/08/2022 06/23/21   [provider]  TRESIBA FLEXTOUCH 100 UNIT/ML FlexTouch Pen Inject 10-20 Units into the skin daily as needed (BS over 150). Patient not taking: Reported on 07/08/2022 06/02/21   [provider]  zinc  gluconate 50 MG tablet Take 50 mg by mouth daily.    [provider]      Allergies    Metformin hcl    Review of Systems   Review of Systems  Cardiovascular:  Positive for chest pain.  All other systems reviewed and are negative.   Physical Exam Updated Vital Signs BP 137/89   Pulse 69   Temp (!) 97.5 F (36.4 C) (Oral)   Resp 13   SpO2 97%  Physical Exam Vitals and nursing note reviewed.  Constitutional:      Appearance: Normal appearance.  HENT:     Head: Normocephalic and atraumatic.  Eyes:     Conjunctiva/sclera: Conjunctivae normal.  Cardiovascular:     Rate and Rhythm: Normal rate and regular rhythm.  Pulmonary:     Effort: Pulmonary effort is normal. No respiratory distress.     Breath sounds: Normal breath sounds.  Abdominal:     General: There is no distension.     Palpations: Abdomen is soft.     Tenderness: There is no abdominal tenderness.  Musculoskeletal:     Right lower leg: No edema.     Left lower leg: No edema.  Skin:    General: Skin is warm and dry.  Neurological:     General: No focal deficit present.     Mental Status: He is alert.     ED Results / Procedures / Treatments   Labs (all labs ordered are listed, but only abnormal results are displayed) Labs Reviewed  BASIC METABOLIC PANEL - Abnormal; Notable for the following components:      Result Value   CO2 21 (*)    Glucose, Bld 229 (*)    BUN 43 (*)    Creatinine, Ser 2.99 (*)    Calcium  8.7 (*)    GFR,  Estimated 23 (*)    All other components within normal limits  TROPONIN I (HIGH SENSITIVITY) - Abnormal; Notable for the following components:   Troponin I (High Sensitivity) 26 (*)    All other components within normal limits  TROPONIN I (HIGH SENSITIVITY) - Abnormal; Notable for the following components:   Troponin I (High Sensitivity) 26 (*)    All other components within normal limits  CBC    EKG None  Radiology DG Chest 2 View Result Date: 09/15/2023 CLINICAL DATA:  Chest pain. Forgot to take prescribed Eliquis  1 month. EXAM: CHEST -  2 VIEW COMPARISON:  01/23/2022 FINDINGS: Patient is rotated to the right. Lungs are adequately inflated with slight elevation of the left hemidiaphragm unchanged. No focal airspace consolidation or effusion. Cardiomediastinal silhouette and remainder of the exam is unchanged. IMPRESSION: No active cardiopulmonary disease. Electronically Signed   By: Toribio Agreste M.D.   On: 09/15/2023 14:59    Procedures Procedures    Medications Ordered in ED Medications  LORazepam  (ATIVAN ) injection 1 mg (1 mg Intravenous Given 09/15/23 1728)  fentaNYL  (SUBLIMAZE ) injection 50 mcg (50 mcg Intravenous Given 09/15/23 1728)    ED Course/ Medical Decision Making/ A&P             HEART Score: 4                    Medical Decision Making Amount and/or Complexity of Data Reviewed Labs: ordered. Radiology: ordered.  Risk OTC drugs. Prescription drug management.   This patient is a 58 y.o. male  who presents to the ED for concern of chest pain.   Differential diagnoses prior to evaluation: The emergent differential diagnosis includes, but is not limited to,  ACS, pericarditis, myocarditis, aortic dissection, PE, pneumothorax, esophageal spasm or rupture, chronic angina, pneumonia, bronchitis, GERD, reflux/PUD, biliary disease, pancreatitis, costochondritis, anxiety. This is not an exhaustive differential.   Past Medical History / Co-morbidities / Social  History: SVT, type 2 diabetes, diabetic retinopathy, neuropathy, depression, anxiety, CHF, chronic osteomyelitis of right toe, atrial fibrillation, STEMI, CAD s/p CABG x 4 in 2022, BPH, chronic kidney disease, hyperlipidemia, gout  Additional history: Chart reviewed. Pertinent results include: Most recent echocardiogram in May 2023, with LVEF 55 to 60%  Physical Exam: Physical exam performed. The pertinent findings include: Hypertensive, otherwise normal vital signs.  No acute distress.  Intermittent episodes of left-sided chest pain during exam.  Heart regular rate and rhythm, lung sounds clear.  No peripheral edema, no tenderness to palpation of the calves.  Lab Tests/Imaging studies: I personally interpreted labs/imaging and the pertinent results include: CBC normal.  BMP grossly at baseline, with elevated glucose, creatinine 2.99, BUN 43, GFR 23.  Troponins flat at 26 x 2.  Chest x-ray without acute abnormalities. I agree with the radiologist interpretation.  I considered CT PE study in this patient, however his kidney function likely would not support a contrasted study. Attending physician Dr Bernard had shared decision making conversation with the patient regarding this, and we feel we can forego CT at this time.   Cardiac monitoring: EKG obtained and interpreted by myself and attending physician which shows: NSR   Medications: I ordered medication including fentanyl , Ativan  for anxiety.  Patient normally on 2 mg Xanax , will order Ativan  IV at 1 mg.  I have reviewed the patients home medicines and have made adjustments as needed. On reevaluation, patient's discomfort has improved.    Disposition: After consideration of the diagnostic results and the patients response to treatment, I feel that emergency department workup does not suggest an emergent condition requiring admission or immediate intervention beyond what has been performed at this time. Patient is to be discharged with  recommendation to follow up with PCP in regards to today's hospital visit. Chest pain is not likely of cardiac or pulmonary etiology d/t presentation, Well's criteria for PE moderate risk, however already on eliquis ; VSS, no tracheal deviation, no JVD or new murmur, RRR, breath sounds equal bilaterally, EKG without acute abnormalities, negative troponin, and negative CXR. Heart score of 4. Pt encouraged to continue eliquis   and follow up with cardiology as soon as able. Pt has been advised to return to the ED if CP becomes exertional, associated with diaphoresis or nausea, radiates to left jaw/arm, worsens or becomes concerning in any way. Pt appears reliable for follow up and is agreeable to discharge.   I discussed this case with my attending physician Dr. Bernard who cosigned this note including patient's presenting symptoms, physical exam, and planned diagnostics and interventions. Attending physician stated agreement with plan or made changes to plan which were implemented.    Final Clinical Impression(s) / ED Diagnoses Final diagnoses:  Nonspecific chest pain    Rx / DC Orders ED Discharge Orders     None      Portions of this report may have been transcribed using voice recognition software. Every effort was made to ensure accuracy; however, inadvertent computerized transcription errors may be present.    Corie Friddie DASEN, PA-C 09/15/23 1810    Bernard Drivers, MD 09/20/23 1400

## 2023-09-15 NOTE — ED Triage Notes (Signed)
 Patient arrives stating that he has had a recent trip to [place], forgetting his Eliquis  and therefore not taking x 1 month. Also has not taken his Amlodipine  in several weeks. Complains of intermittent L sided chest pain since yesterday, increasing in frequency and severity. Denies shortness of breath. Did take Amlodipine  and Eliquis  just PTA.

## 2023-09-15 NOTE — Discharge Instructions (Addendum)
 You were seen in the emergency department today for chest pain.  As we discussed your lab work, EKG, chest x-ray all looked reassuring today. We discussed possibly imaging your chest for a blood clot, but we discussed that with your kidney function, this may do more damage than benefit. The treatment for a blood clot would be restarting your Eliquis . We do not see any concerning findings on your workup requiring hospitalization today.   Please follow up with your cardiologist ASAP.   I recommend monitoring your stress levels.  Continue to monitor how you are doing overall, and return to the emergency department for any new or worsening symptoms such as: Worsening pain or pain with exertion, difficulty breathing, sweating, or pain or swelling in your legs.

## 2023-09-15 NOTE — ED Notes (Signed)
 1 mg ativan wasted and witnessed by Calton Dach PharmD, patient was discharge and out of pyxis.

## 2023-09-28 LAB — OPHTHALMOLOGY REPORT-SCANNED

## 2023-10-24 ENCOUNTER — Other Ambulatory Visit: Payer: Self-pay | Admitting: Cardiovascular Disease

## 2023-11-01 ENCOUNTER — Ambulatory Visit: Payer: 59 | Admitting: Podiatry

## 2023-11-05 ENCOUNTER — Ambulatory Visit (HOSPITAL_BASED_OUTPATIENT_CLINIC_OR_DEPARTMENT_OTHER): Payer: 59 | Admitting: Family

## 2023-11-17 LAB — LAB REPORT - SCANNED: Creatinine, POC: 60.6 mg/dL

## 2023-12-29 LAB — OPHTHALMOLOGY REPORT-SCANNED

## 2024-02-02 ENCOUNTER — Other Ambulatory Visit: Payer: Self-pay | Admitting: Cardiovascular Disease

## 2024-02-14 ENCOUNTER — Telehealth: Payer: Self-pay | Admitting: Cardiovascular Disease

## 2024-02-14 NOTE — Telephone Encounter (Signed)
 Returned call to pt,   Patient states he is currently without primary care due to missing appointments. He states his sister is looking for a new primary care. Advised patient we can only fill and manage what we prescribe.

## 2024-02-14 NOTE — Telephone Encounter (Signed)
 Patient is calling because he is currently without a pcp and would like to know if we can start managing some of his other medications. Please advise.

## 2024-02-22 ENCOUNTER — Other Ambulatory Visit: Payer: Self-pay | Admitting: Cardiovascular Disease

## 2024-03-03 ENCOUNTER — Other Ambulatory Visit: Payer: Self-pay | Admitting: *Deleted

## 2024-03-03 MED ORDER — CARVEDILOL 6.25 MG PO TABS: 6.2500 mg | ORAL_TABLET | Freq: Two times a day (BID) | ORAL | 0 refills | Status: DC

## 2024-03-10 ENCOUNTER — Encounter (HOSPITAL_BASED_OUTPATIENT_CLINIC_OR_DEPARTMENT_OTHER): Payer: Self-pay

## 2024-03-13 ENCOUNTER — Ambulatory Visit (HOSPITAL_BASED_OUTPATIENT_CLINIC_OR_DEPARTMENT_OTHER): Admitting: Family

## 2024-03-13 ENCOUNTER — Encounter (HOSPITAL_BASED_OUTPATIENT_CLINIC_OR_DEPARTMENT_OTHER): Payer: Self-pay | Admitting: Family

## 2024-03-13 VITALS — BP 128/82 | HR 75 | Ht 74.0 in | Wt 250.8 lb

## 2024-03-13 DIAGNOSIS — I2510 Atherosclerotic heart disease of native coronary artery without angina pectoris: Secondary | ICD-10-CM

## 2024-03-13 DIAGNOSIS — I5042 Chronic combined systolic (congestive) and diastolic (congestive) heart failure: Secondary | ICD-10-CM

## 2024-03-13 DIAGNOSIS — I480 Paroxysmal atrial fibrillation: Secondary | ICD-10-CM

## 2024-03-13 DIAGNOSIS — D6859 Other primary thrombophilia: Secondary | ICD-10-CM

## 2024-03-13 MED ORDER — CARVEDILOL 6.25 MG PO TABS
6.2500 mg | ORAL_TABLET | Freq: Two times a day (BID) | ORAL | 6 refills | Status: DC | PRN
Start: 2024-03-13 — End: 2024-07-03

## 2024-03-13 MED ORDER — CLOPIDOGREL BISULFATE 75 MG PO TABS: 75.0000 mg | ORAL_TABLET | Freq: Every day | ORAL | 3 refills | Status: AC

## 2024-03-13 MED ORDER — HYDRALAZINE HCL 25 MG PO TABS: 12.5000 mg | ORAL_TABLET | Freq: Two times a day (BID) | ORAL | 6 refills | Status: DC | PRN

## 2024-03-13 MED ORDER — LOSARTAN POTASSIUM 25 MG PO TABS: 25.0000 mg | ORAL_TABLET | Freq: Every day | ORAL | 3 refills | Status: AC

## 2024-03-13 MED ORDER — DAPAGLIFLOZIN PROPANEDIOL 10 MG PO TABS
10.0000 mg | ORAL_TABLET | Freq: Every day | ORAL | 3 refills | Status: AC
Start: 2024-03-13 — End: ?

## 2024-03-13 MED ORDER — RIVAROXABAN 15 MG PO TABS: 15.0000 mg | ORAL_TABLET | Freq: Every day | ORAL | 3 refills | Status: DC

## 2024-03-13 NOTE — Progress Notes (Signed)
 Cardiology Office Note   Date:  03/13/2024  ID:  JAMILL WETMORE, DOB [birth year], MRN [id number] PCP: Nichole Senior, MD  [facility] Providers Cardiologist:  Annabella Scarce, MD { Click to update primary MD,subspecialty MD or APP then REFRESH:1}    History of Present Illness DANNEL RAFTER is a 58 y.o. male ***with hx of visual impairment, DM2, HTN, depression, anxiety, SV, CKDII, PAF, CAD, chronic systolic heart failure.  Admitted for STEMI 07/07/2021 [facility] with three-vessel coronary disease with PCI of culprit vessel followed by CABG X4 radial harvest and maze.  Intraoperative LVEF 20%.  Hospitalization complicated by recurrent atrial fibrillation requiring IV amiodarone  as well as AKI on CKD 3B requiringi HD.  Repeat echo LVEF 40 to 45%, mild LVH, mildly decreased RV systolic function, normal IVC.  Tunneled HD cath removed.  Discharged to SNF.  Admitted 01/2022 with influenza found of AKI and ECG changes.  STEMI ruled out.  Echo LVEF 55-60%, grade 2 diastolic dysfunction.  GDMT held and gently rehydrated.  Prior to discharge Farxiga , hydralazine , carvedilol  resumed.  Lasix  stopped.  Last seen by heart failure transitions of care clinic 02/03/2022.  He was feeling overall well, exercising on his bike.  Lasix  was adjusted to 20 mg as needed.  Recommended consider increasing hydralazine  from 12.5 to 25 mg 3 times daily if BP remains elevated.  He was maintaining sinus rhythm and Eliquis  5 mg twice daily continued.  He was recommended follow-up with heart failure clinic in 6 months but has not been seen since that time.  ED visit 09/15/2023 with chest pain after returning from a trip to [place].  He did not have Eliquis  or amlodipine  during that time.  Chest x-ray with no acute abnormalities.  CT PE deferred due to renal function.  ?chest pain Kidney restore Netal seeds tincture 2-3x per day Mono fruit diet dark grapes, watermelon, with fabulous five adrenal, liver, bowels, kidney  cleanse a week to 30 days  Lamisil  - fungal   Discussed the use of AI scribe software for clinical note transcription with the patient, who gave verbal consent to proceed.  History of Present Illness GASTON DASE is a 58 year old male with atrial fibrillation and hypertension who presents for a cardiovascular follow-up.  He has not experienced recent episodes of chest pain since an emergency room visit in January, which was associated with palpitations and fear after consuming a large amount of sweet tea. He avoids caffeine and dark chocolate to prevent further episodes.  He takes Eliquis  once daily at night, although it is typically prescribed twice daily. He has stopped taking atorvastatin  due to personal beliefs against statins. He takes carvedilol  once daily at night and plans to resume vitamin D  supplementation. He also takes Plavix  and Farxiga  once daily, and Lasix  as needed, though he has not used it recently.  He experienced high blood pressure after overeating and took hydralazine , which he keeps for emergencies. He monitors his blood pressure at home with an arm cuff, and it is generally well-controlled.  He is physically active, engaging in biking and walking to build endurance, although he notes decreased motivation and some hip pain when not exercising regularly. He is working on improving his exercise routine to enhance his overall health and well-being.    Dispense report- Alprazolam  02/07/2589 day supply Amlodipine  630/2500 days supply Apixaban  02/01/2499 a supply Carvedilol  6.25 mg 15-day supply 03/13/2024 Clopidogrel  75 mg 02/02/2024 100-day supply Farxiga  02/01/2499 day supply Losartan 01/29/2499 to supply  ROS: Please see the history of present illness.    All other systems reviewed and are negative.   Studies Reviewed      *** Risk Assessment/Calculations  CHA2DS2-VASc Score = 4  {Confirm score is correct.  If not, click here to update score.  REFRESH note.  :1}  This indicates a 4.8% annual risk of stroke. The patient's score is based upon: CHF History: 1 HTN History: 1 Diabetes History: 1 Stroke History: 0 Vascular Disease History: 1 Age Score: 0 Gender Score: 0   {This patient has a significant risk of stroke if diagnosed with atrial fibrillation.  Please consider VKA or DOAC agent for anticoagulation if the bleeding risk is acceptable.   You can also use the SmartPhrase .[email or web address] for documentation.   :[id number]}         Physical Exam VS:  BP 128/82   Pulse 75   Ht 6' 2 (1.88 m)   Wt 250 lb 12.8 oz (113.8 kg)   SpO2 97%   BMI 32.20 kg/m        Wt Readings from Last 3 Encounters:  03/13/24 250 lb 12.8 oz (113.8 kg)  02/06/22 246 lb (111.6 kg)  02/03/22 246 lb (111.6 kg)    GEN: Well nourished, well developed in no acute distress NECK: No JVD; No carotid bruits CARDIAC: ***RRR, no murmurs, rubs, gallops RESPIRATORY:  Clear to auscultation without rales, wheezing or rhonchi  ABDOMEN: Soft, non-tender, non-distended EXTREMITIES:  No edema; No deformity   ASSESSMENT AND PLAN *** Chronic combined systolic and diastolic heart failure with recovered LVEF- SVT- CAD s/p D1 PCI and CABG X4 06/2021- HTN- Obesity-       Dispo: ***  Signed, Reche GORMAN Finder, NP

## 2024-03-13 NOTE — Patient Instructions (Addendum)
 Medication Instructions:   STOP Eliquis   START Xarelto 15 mg every evening  CHANGE Carvedilol  to as needed for palpitations  CHANGE Hydralazine  to as needed for systolic blood pressure (top number) more than 170  *If you need a refill on your cardiac medications before your next appointment, please call your pharmacy*  Testing/Procedures Your physician has requested that you have an echocardiogram late August or early September. Echocardiography is a painless test that uses sound waves to create images of your heart. It provides your doctor with information about the size and shape of your heart and how well your heart's chambers and valves are working. This procedure takes approximately one hour. There are no restrictions for this procedure. Please do NOT wear cologne, perfume, aftershave, or lotions (deodorant is allowed). Please arrive 15 minutes prior to your appointment time.  Please note: We ask at that you not bring children with you during ultrasound (echo/ vascular) testing. Due to room size and safety concerns, children are not allowed in the ultrasound rooms during exams. Our front office staff cannot provide observation of children in our lobby area while testing is being conducted. An adult accompanying a patient to their appointment will only be allowed in the ultrasound room at the discretion of the ultrasound technician under special circumstances. We apologize for any inconvenience.   Follow-Up: At [facility], you and your health needs are our priority.  As part of our continuing mission to provide you with exceptional heart care, our providers are all part of one team.  This team includes your primary Cardiologist (physician) and Advanced Practice Providers or APPs (Physician Assistants and Nurse Practitioners) who all work together to provide you with the care you need, when you need it.  Your next appointment:   After echocardiogram  Provider:   Dr. Annabella Scarce  We recommend signing up for the patient portal called MyChart.  Sign up information is provided on this After Visit Summary.  MyChart is used to connect with patients for Virtual Visits (Telemedicine).  Patients are able to view lab/test results, encounter notes, upcoming appointments, etc.  Non-urgent messages can be sent to your provider as well.   To learn more about what you can do with MyChart, go to [email or web address].   Other Instructions  Natural remedies have not been found to be effective in lowering cholesterol and preventing progression of aortic atherosclerosis. I've linked a good article below for you if you are interested. It found that low dose statin was significant in reducing cholesterol compared to a number of supplements.   [email or web address]

## 2024-04-25 ENCOUNTER — Telehealth: Payer: Self-pay

## 2024-04-25 NOTE — Telephone Encounter (Signed)
 Copied from CRM [id number]. Topic: Appointments - Scheduling Inquiry for Clinic >> Apr 25, 2024 12:19 PM Mia F wrote: Reason for CRM: Pt is asking to be scheduled with Dr Rollene. Pt has been told that she is not currently seeing any new patients. Pt says he has been doing a lot of research and she is the only person he would like to see. He is asking if she could make an exception for him or to place him on a list for when she does take on np's again. Pt is aware she is not taking new pts at all at this time

## 2024-04-28 NOTE — Telephone Encounter (Signed)
 Unable to take at this time sorry.

## 2024-04-28 NOTE — Telephone Encounter (Signed)
 LM stating Dr. Rollene is unable to accept him as a new patient at this time

## 2024-05-11 ENCOUNTER — Ambulatory Visit (INDEPENDENT_AMBULATORY_CARE_PROVIDER_SITE_OTHER)

## 2024-05-11 ENCOUNTER — Ambulatory Visit (HOSPITAL_BASED_OUTPATIENT_CLINIC_OR_DEPARTMENT_OTHER): Payer: Self-pay | Admitting: Family

## 2024-05-11 DIAGNOSIS — I5042 Chronic combined systolic (congestive) and diastolic (congestive) heart failure: Secondary | ICD-10-CM | POA: Diagnosis not present

## 2024-05-11 DIAGNOSIS — I2510 Atherosclerotic heart disease of native coronary artery without angina pectoris: Secondary | ICD-10-CM | POA: Diagnosis not present

## 2024-05-11 DIAGNOSIS — I480 Paroxysmal atrial fibrillation: Secondary | ICD-10-CM | POA: Diagnosis not present

## 2024-05-11 DIAGNOSIS — D6859 Other primary thrombophilia: Secondary | ICD-10-CM | POA: Diagnosis not present

## 2024-05-11 LAB — ECHOCARDIOGRAM COMPLETE
Area-P 1/2: 3.37 cm2
S' Lateral: 3.04 cm

## 2024-05-11 MED ORDER — PERFLUTREN LIPID MICROSPHERE
1.0000 mL | INTRAVENOUS | Status: AC | PRN
  Administered 2024-05-11: 1 mL via INTRAVENOUS

## 2024-06-13 ENCOUNTER — Other Ambulatory Visit: Payer: Self-pay | Admitting: Internal Medicine

## 2024-06-13 ENCOUNTER — Encounter: Payer: Self-pay | Admitting: Family Medicine

## 2024-06-13 ENCOUNTER — Ambulatory Visit (INDEPENDENT_AMBULATORY_CARE_PROVIDER_SITE_OTHER): Admitting: Family Medicine

## 2024-06-13 VITALS — BP 124/82 | HR 60 | Temp 98.2°F | Ht 74.0 in | Wt 238.0 lb

## 2024-06-13 DIAGNOSIS — E66811 Obesity, class 1: Secondary | ICD-10-CM | POA: Diagnosis not present

## 2024-06-13 DIAGNOSIS — Z6830 Body mass index (BMI) 30.0-30.9, adult: Secondary | ICD-10-CM

## 2024-06-13 DIAGNOSIS — E113599 Type 2 diabetes mellitus with proliferative diabetic retinopathy without macular edema, unspecified eye: Secondary | ICD-10-CM

## 2024-06-13 DIAGNOSIS — G4700 Insomnia, unspecified: Secondary | ICD-10-CM | POA: Diagnosis not present

## 2024-06-13 DIAGNOSIS — Z1159 Encounter for screening for other viral diseases: Secondary | ICD-10-CM

## 2024-06-13 DIAGNOSIS — E785 Hyperlipidemia, unspecified: Secondary | ICD-10-CM | POA: Diagnosis not present

## 2024-06-13 DIAGNOSIS — F419 Anxiety disorder, unspecified: Secondary | ICD-10-CM

## 2024-06-13 DIAGNOSIS — Z794 Long term (current) use of insulin: Secondary | ICD-10-CM | POA: Diagnosis not present

## 2024-06-13 DIAGNOSIS — N179 Acute kidney failure, unspecified: Secondary | ICD-10-CM

## 2024-06-13 DIAGNOSIS — Z7689 Persons encountering health services in other specified circumstances: Secondary | ICD-10-CM

## 2024-06-13 LAB — CBC WITH DIFFERENTIAL/PLATELET
Basophils Absolute: 0 K/uL (ref 0.0–0.1)
Basophils Relative: 0.4 % (ref 0.0–3.0)
Eosinophils Absolute: 0.2 K/uL (ref 0.0–0.7)
Eosinophils Relative: 2.9 % (ref 0.0–5.0)
HCT: 42.6 % (ref 39.0–52.0)
Hemoglobin: 14.3 g/dL (ref 13.0–17.0)
Lymphocytes Relative: 29.1 % (ref 12.0–46.0)
Lymphs Abs: 1.6 K/uL (ref 0.7–4.0)
MCHC: 33.5 g/dL (ref 30.0–36.0)
MCV: 96 fl (ref 78.0–100.0)
Monocytes Absolute: 0.3 K/uL (ref 0.1–1.0)
Monocytes Relative: 5.8 % (ref 3.0–12.0)
Neutro Abs: 3.4 K/uL (ref 1.4–7.7)
Neutrophils Relative %: 61.8 % (ref 43.0–77.0)
Platelets: 152 K/uL (ref 150.0–400.0)
RBC: 4.44 Mil/uL (ref 4.22–5.81)
RDW: 13.4 % (ref 11.5–15.5)
WBC: 5.5 K/uL (ref 4.0–10.5)

## 2024-06-13 LAB — LIPID PANEL
Cholesterol: 142 mg/dL (ref 0–200)
HDL: 26.1 mg/dL — ABNORMAL LOW (ref >39.00)
LDL Cholesterol: 66 mg/dL (ref 0–99)
NonHDL: 116.36
Total CHOL/HDL Ratio: 5
Triglycerides: 252 mg/dL — ABNORMAL HIGH (ref 0.0–149.0)
VLDL: 50.4 mg/dL — ABNORMAL HIGH (ref 0.0–40.0)

## 2024-06-13 LAB — COMPREHENSIVE METABOLIC PANEL WITH GFR
ALT: 6 U/L (ref 0–53)
AST: 12 U/L (ref 0–37)
Albumin: 4.4 g/dL (ref 3.5–5.2)
Alkaline Phosphatase: 45 U/L (ref 39–117)
BUN: 37 mg/dL — ABNORMAL HIGH (ref 6–23)
CO2: 26 meq/L (ref 19–32)
Calcium: 9.2 mg/dL (ref 8.4–10.5)
Chloride: 106 meq/L (ref 96–112)
Creatinine, Ser: 3.27 mg/dL — ABNORMAL HIGH (ref 0.40–1.50)
GFR: 19.9 mL/min — ABNORMAL LOW
Glucose, Bld: 104 mg/dL — ABNORMAL HIGH (ref 70–99)
Potassium: 4.7 meq/L (ref 3.5–5.1)
Sodium: 141 meq/L (ref 135–145)
Total Bilirubin: 0.6 mg/dL (ref 0.2–1.2)
Total Protein: 6.9 g/dL (ref 6.0–8.3)

## 2024-06-13 LAB — MICROALBUMIN / CREATININE URINE RATIO
Creatinine,U: 148.9 mg/dL
Microalb Creat Ratio: 1001.2 mg/g — ABNORMAL HIGH (ref 0.0–30.0)
Microalb, Ur: 149.1 mg/dL — ABNORMAL HIGH (ref 0.0–1.9)

## 2024-06-13 LAB — TSH: TSH: 2.13 u[IU]/mL (ref 0.35–5.50)

## 2024-06-13 MED ORDER — BD PEN NEEDLE NANO 2ND GEN 32G X 4 MM MISC: 1 refills | Status: AC

## 2024-06-13 MED ORDER — FREESTYLE LIBRE 14 DAY SENSOR MISC: 3 refills | Status: DC

## 2024-06-13 MED ORDER — MAGNESIUM GLYCINATE 100 MG PO CAPS: 200.0000 mg | ORAL_CAPSULE | Freq: Every day | ORAL | 1 refills | Status: AC
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
## 2024-06-13 NOTE — Patient Instructions (Addendum)
-  It was nice to meet you and look forward to taking care of you.  -Ordered labs. Office will call with results and will be available MyChart. -Refilled Freestyle Libre, insulin  pen needle, and magnesium . -Ordered urine drug screen for management of Alprazolam . This will need to be collected periodically. Sign controlled substance agreement and will need to be renewed each year.  -Placed a referral to endocrinology. Please call the office or send a MyChart message if you do not receive a phone call or a MyChart message about appointment in 2 weeks.  -Follow up in 3 months.   Notes from cardiology:  STOP Eliquis    START Xarelto  15 mg every evening   CHANGE Carvedilol  to as needed for palpitations   CHANGE Hydralazine  to as needed for systolic blood pressure (top number) more than 170

## 2024-06-14 ENCOUNTER — Other Ambulatory Visit

## 2024-06-14 ENCOUNTER — Ambulatory Visit: Payer: Self-pay | Admitting: Family Medicine

## 2024-06-14 DIAGNOSIS — Z794 Long term (current) use of insulin: Secondary | ICD-10-CM | POA: Diagnosis not present

## 2024-06-14 DIAGNOSIS — E113599 Type 2 diabetes mellitus with proliferative diabetic retinopathy without macular edema, unspecified eye: Secondary | ICD-10-CM

## 2024-06-14 LAB — DRUG MONITOR, PANEL 1, SCREEN, URINE
Amphetamines: NEGATIVE ng/mL (ref <500)
Barbiturates: NEGATIVE ng/mL (ref <300)
Benzodiazepines: POSITIVE ng/mL — AB (ref <100)
Cocaine Metabolite: NEGATIVE ng/mL (ref <150)
Creatinine: 148.9 mg/dL (ref >=20.0)
Marijuana Metabolite: NEGATIVE ng/mL (ref <20)
Methadone Metabolite: NEGATIVE ng/mL (ref <100)
Opiates: NEGATIVE ng/mL (ref <100)
Oxidant: NEGATIVE ug/mL (ref <200)
Oxycodone: NEGATIVE ng/mL (ref <100)
Phencyclidine: NEGATIVE ng/mL (ref <25)
pH: 5.3 (ref 4.5–9.0)

## 2024-06-14 LAB — DM TEMPLATE

## 2024-06-14 LAB — HEPATITIS C ANTIBODY: Hepatitis C Ab: NONREACTIVE

## 2024-06-14 NOTE — Assessment & Plan Note (Addendum)
 Controlled. Scored 0 on GAD-7 and PHQ-9. Continue Alprazolam  2mg  TID PRN. Ordered urine drug screen for management of Alprazolam . This will need to be collected periodically. Sign controlled substance agreement and will need to be renewed each year. PDMP reviewed. Last refill was on 05/03/24 for 3 month supply (270 tablets). This provider does not feel comfortable prescribing that many at at time. Will prescribe a 30 day supply.

## 2024-06-14 NOTE — Assessment & Plan Note (Addendum)
 Do not have a recent A1c. Continue Humalog 3U PRN and Farxiga  10mg  daily. Refilled Freestyle Libre to continue to monitor blood sugar, especially being on an insulin  that can easily cause hypoglycemia. Will order A1c, microalbumin/creatinine, and CMP. Foot exam completed and will request records from [facility]. Patient is on an ARB, but not a statin. Based on chart review from cardiologist, he has personal beliefs about taking statins and going to be addressed at his next cardiology visit.

## 2024-06-14 NOTE — Assessment & Plan Note (Signed)
 Stable. Continue Alprazolam  2 TID PRN. Ordered urine drug screen for management of Alprazolam . This will need to be collected periodically. Sign controlled substance agreement and will need to be renewed each year.  -He reports he is taking Magnesium  Glycinate more for the benefits of sleeping. He has been taking OTC medication but would like to see if insurance will cover through a prescription.

## 2024-06-15 ENCOUNTER — Encounter: Payer: Self-pay | Admitting: Endocrinology

## 2024-06-15 ENCOUNTER — Ambulatory Visit

## 2024-06-15 ENCOUNTER — Other Ambulatory Visit

## 2024-06-15 DIAGNOSIS — E113591 Type 2 diabetes mellitus with proliferative diabetic retinopathy without macular edema, right eye: Secondary | ICD-10-CM

## 2024-06-15 LAB — HEMOGLOBIN A1C: Hgb A1c MFr Bld: 6.2 % (ref 4.6–6.5)

## 2024-06-15 NOTE — Assessment & Plan Note (Signed)
 Not on a statin due to personal beliefs. Ordered lipid panel and CMP.

## 2024-06-16 ENCOUNTER — Ambulatory Visit: Payer: Self-pay | Admitting: Family Medicine

## 2024-06-16 ENCOUNTER — Telehealth: Payer: Self-pay | Admitting: *Deleted

## 2024-06-16 ENCOUNTER — Other Ambulatory Visit: Payer: Self-pay | Admitting: Family Medicine

## 2024-06-16 NOTE — Telephone Encounter (Signed)
 Copied from CRM #[id number]. Topic: Clinical - Lab/Test Results >> Jun 16, 2024 10:34 AM Carlyon D wrote: Reason for CRM: Pt is calling in regards to his Lab results he states he wants to speak to a nurse again as he has some questions and would like to go over them as he has some questions and wants an update.  Pt also just stated he would like the medication sent over now that his lab work is done. alprazolam  (XANAX ) 2 MG tablet Mirazapine Pt states he does not know how many MG that medication is and I do not see it on PT med list. Please reach out to pt if more info is needed.

## 2024-06-19 NOTE — Telephone Encounter (Signed)
 Pt was notified.

## 2024-06-20 ENCOUNTER — Telehealth: Payer: Self-pay

## 2024-06-20 ENCOUNTER — Ambulatory Visit
Admission: RE | Admit: 2024-06-20 | Discharge: 2024-06-20 | Disposition: A | Discharge status: Home / Self Care | Source: Ambulatory Visit | Attending: Internal Medicine | Admitting: Internal Medicine

## 2024-06-20 DIAGNOSIS — N179 Acute kidney failure, unspecified: Secondary | ICD-10-CM

## 2024-06-20 NOTE — Telephone Encounter (Signed)
 Pt called sent message to pcp

## 2024-06-29 ENCOUNTER — Encounter: Payer: Self-pay | Admitting: *Deleted

## 2024-06-30 ENCOUNTER — Encounter: Payer: Self-pay | Admitting: *Deleted

## 2024-07-03 ENCOUNTER — Encounter (HOSPITAL_BASED_OUTPATIENT_CLINIC_OR_DEPARTMENT_OTHER): Payer: Self-pay | Admitting: Cardiovascular Disease

## 2024-07-03 ENCOUNTER — Ambulatory Visit (INDEPENDENT_AMBULATORY_CARE_PROVIDER_SITE_OTHER): Admitting: Cardiovascular Disease

## 2024-07-03 DIAGNOSIS — I2510 Atherosclerotic heart disease of native coronary artery without angina pectoris: Secondary | ICD-10-CM

## 2024-07-03 DIAGNOSIS — I480 Paroxysmal atrial fibrillation: Secondary | ICD-10-CM | POA: Diagnosis not present

## 2024-07-03 DIAGNOSIS — D6859 Other primary thrombophilia: Secondary | ICD-10-CM | POA: Diagnosis not present

## 2024-07-03 DIAGNOSIS — I5042 Chronic combined systolic (congestive) and diastolic (congestive) heart failure: Secondary | ICD-10-CM | POA: Diagnosis not present

## 2024-07-03 MED ORDER — CARVEDILOL 6.25 MG PO TABS: 6.2500 mg | ORAL_TABLET | Freq: Two times a day (BID) | ORAL | 6 refills | Status: AC

## 2024-07-03 MED ORDER — RIVAROXABAN 15 MG PO TABS: 15.0000 mg | ORAL_TABLET | Freq: Every day | ORAL | 3 refills | Status: AC

## 2024-07-03 NOTE — Patient Instructions (Addendum)
 Medication Instructions:  TAKE YOUR CARVEDILOL  TWICE A DAY   REFILL YOUR XARELTO    *If you need a refill on your cardiac medications before your next appointment, please call your pharmacy*  Lab Work: NONE  Testing/Procedures: Your physician has requested that you have an echocardiogram. Echocardiography is a painless test that uses sound waves to create images of your heart. It provides your doctor with information about the size and shape of your heart and how well your heart's chambers and valves are working. This procedure takes approximately one hour. There are no restrictions for this procedure. Please do NOT wear cologne, perfume, aftershave, or lotions (deodorant is allowed). Please arrive 15 minutes prior to your appointment time.  Please note: We ask at that you not bring children with you during ultrasound (echo/ vascular) testing. Due to room size and safety concerns, children are not allowed in the ultrasound rooms during exams. Our front office staff cannot provide observation of children in our lobby area while testing is being conducted. An adult accompanying a patient to their appointment will only be allowed in the ultrasound room at the discretion of the ultrasound technician under special circumstances. We apologize for any inconvenience.  TO BE DONE IN FEBRUARY   Follow-Up: At [facility], you and your health needs are our priority.  As part of our continuing mission to provide you with exceptional heart care, our providers are all part of one team.  This team includes your primary Cardiologist (physician) and Advanced Practice Providers or APPs (Physician Assistants and Nurse Practitioners) who all work together to provide you with the care you need, when you need it.  Your next appointment:   AFTER ECHO IN FEBRUARY   Provider:   Annabella Scarce, MD, Rosaline Bane, NP, or Reche Finder, NP   We recommend signing up for the patient portal called  MyChart.  Sign up information is provided on this After Visit Summary.  MyChart is used to connect with patients for Virtual Visits (Telemedicine).  Patients are able to view lab/test results, encounter notes, upcoming appointments, etc.  Non-urgent messages can be sent to your provider as well.   To learn more about what you can do with MyChart, go to [email or web address].   Other Instructions MONITOR AND LOG YOUR BLOOD PRESSURE TWICE A DAY ABOUT A WEEK PRIOR TO YOUR FOLLOW UP IN FEBRUARY, BRING READINGS TO VISIT

## 2024-07-03 NOTE — Progress Notes (Signed)
 Cardiology Office Note:  .   Date:  07/03/2024  ID:  Billy [name] Hence, DOB 08/12/1966, MRN [id number] PCP: Billy Philippe SAUNDERS, NP  [facility] Providers Cardiologist:  None    History of Present Illness: .    Billy [name] is a 58 y.o. male with a hx of CAD s/p CABG, PAF s/p LAA clipping, anxiety, depression, hypertension, diabetes, CKD stage 3, SVT s/p ablation, here for follow up.  He was initially seen 06/2021 for evaluation of atrial fibrillation. He has a hx of SVT ablation 25 years ago. He was seen in the ED 08/2020 with palpitations after smoking marijuana. There was a report of atrial fibrillation while in route with EMS. He was given a prescription for Eliquis  and instructed to follow up with the Afib clinic, but he does not recall these events. He presented to [facility] 06/2021 with hypertensive urgency and atrial fibrillation. He was seen by Dr. Lavona and noted increasing frequency of palpitatoins. He had an Echo that revealed LVEF 50-55% with grade 1 diastolic dysfunction. His descending aorta was 3.9 cm. Cardiac enzymes were mildly elevated to 44, and thought to be related to demand ischemia. No atrial fibrillation was noted in the hospital , but he was discharged with an ambulatory monitor. He was previously prescribed amlodipine  but had not been taking it. He was continued on ramipril  and amlodipine  was added to his regimen.   At his initial visit he reported episodes of heart racing and chest pain.  His blood pressure is poorly controlled so carvedilol  was added.  He was referred for an exercise Myoview  due to his exertional dyspnea.  He had a nuclear stress test 06/2021 and only the rest  portion was completed.  It was concerning for prior infarct.  After stress testing EKG revealed ST elevations anteriorly and laterally and he was taken emergently to the hospital.  He initially refused cath but was ultimately cathed and found to have severe, multivessel disease.   He had PCI of an acute occlusion of D1.  CT surgery was consulted and he ultimately underwent CABG x4 with LAA clipping on 07/08/21.   In the OR LVEF on TEE was 20%.  He was managed by the advanced heart failure team.  Hospitalization was complicated by hypotension requiring pressors and atrial for with RVR.  Amiodarone  was discontinued prior to leaving the hospital.  He also had acute on chronic renal failure requiring intermittent HD.  Plan was made for him to be on antiplatelets and anticoagulation for 6 months.  His most recent echo 07/2021 showed improvement in his LVEF to 40 to 45%.  Grade 2 diastolic dysfunction.  At follow-up 09/2021 he was doing well and blood pressure was controlled.  He was hospitalized 01/2022 with influenza.  Echo at the time revealed LVEF 55-60% and grade 2 diastolic dysfunction.  He followed up with the heart failure team and was back to exercising.  He was seen in the ED 09/2023 with chest pain after a visit to [place].  On his trip he had not been taking amlodipine  or Eliquis .  He followed up with Reche Finder, NP 02/2024 and doing well.  He was taking Eliquis  only once a day and had stopped taking atorvastatin  due to his personal beliefs about statins.  He was also only taking carvedilol  once a day.  Eliquis  was switched to Xarelto .  Discussed the use of AI scribe software for clinical note transcription with the patient, who gave verbal  consent to proceed.  History of Present Illness Billy [name] has a history of heart failure. His ejection fraction was 55-60% in 2023 and 45-50% in August 2025. He experiences palpitations and is currently taking carvedilol  once daily, although it should be taken twice daily. He has not started Xarelto  yet, which was prescribed to replace Eliquis  due to dosing frequency issues. He has been on Plavix  for arterial clot prevention. Blood pressure readings at home are typically around 120/80 mmHg.  He has chronic kidney disease with a GFR that  improved from 17 to 19.9. He underwent a grape cleanse to manage high potassium levels, which are now within normal range. He avoids high-potassium foods like bananas, avocados, and tomatoes. A benign 9 mm cyst was found in his right kidney. He is visually impaired and has a new nephrologist, Dr. Norine.  He has been prescribed losartan  for blood pressure and kidney protection, and Farxiga  to manage blood sugar and slow kidney disease progression. He has a prescription for hydralazine  to use if his systolic blood pressure exceeds 160 mmHg, but he has not needed it recently. He reports some leg swelling, which has improved with exercise and dietary changes.  He acknowledges a decrease in physical activity but has recently resumed some exercise, including mowing and biking. He has been sleeping better over the past three days.  ROS:  As per HPI  Studies Reviewed: .       Echo 05/11/24: 1. Left ventricular ejection fraction, by estimation, is 45 to 50%. The  left ventricle has mildly decreased function. The left ventricle  demonstrates regional wall motion abnormalities (see scoring  diagram/findings for description). There is mild  concentric left ventricular hypertrophy. Left ventricular diastolic  parameters are consistent with Grade I diastolic dysfunction (impaired  relaxation). Elevated left ventricular end-diastolic pressure.   2. Right ventricular systolic function is normal. The right ventricular  size is normal.   3. Left atrial size was moderately dilated.   4. The mitral valve is normal in structure. No evidence of mitral valve  regurgitation. No evidence of mitral stenosis.   5. The aortic valve is tricuspid. Aortic valve regurgitation is not  visualized. No aortic stenosis is present.   6. The inferior vena cava is normal in size with greater than 50%  respiratory variability, suggesting right atrial pressure of 3 mmHg.   Physical Exam VITALS: BP- 134/78 GENERAL: Alert,  cooperative, well developed, no acute distress. HEENT: Normocephalic, normal oropharynx, moist mucous membranes. CHEST: Clear to auscultation bilaterally, no wheezes, rhonchi, or crackles. CARDIOVASCULAR: Normal heart rate and rhythm, S1 and S2 normal without murmurs. ABDOMEN: Soft, non-tender, non-distended, without organomegaly, normal bowel sounds. EXTREMITIES: Mild pitting edema in legs, no cyanosis. NEUROLOGICAL: Cranial nerves grossly intact, moves all extremities without gross motor or sensory deficit.   Risk Assessment/Calculations:            Physical Exam:   VS:  BP 134/78   Pulse 62   Ht 6' 2 (1.88 m)   Wt 243 lb (110.2 kg)   SpO2 98%   BMI 31.20 kg/m  , BMI Body mass index is 31.2 kg/m. GENERAL:  Well appearing HEENT: Pupils equal round and reactive, fundi not visualized, oral mucosa unremarkable NECK:  No jugular venous distention, waveform within normal limits, carotid upstroke brisk and symmetric, no bruits, no thyromegaly LUNGS:  Clear to auscultation bilaterally HEART:  RRR.  PMI not displaced or sustained,S1 and S2 within normal limits, no S3, no S4, no clicks, no  rubs, no murmurs ABD:  Flat, positive bowel sounds normal in frequency in pitch, no bruits, no rebound, no guarding, no midline pulsatile mass, no hepatomegaly, no splenomegaly EXT:  2 plus pulses throughout, no edema, no cyanosis no clubbing SKIN:  No rashes no nodules NEURO:  Cranial nerves II through XII grossly intact, motor grossly intact throughout PSYCH:  Cognitively intact, oriented to person place and time   ASSESSMENT AND PLAN: .    Assessment & Plan # HFmrEF:  Ejection fraction decreased to 45-50% in August 2025.  It had previously normalized but he has not been taking his medications regularly or as prescribed.  On carvedilol  for heart rate control, blood pressure management, and heart failure prevention. Medication adherence is crucial.   - Start back taking carvedilol  twice daily.   Also offered the option of starting metoprolol  succinate given his difficulty with twice daily medications but he declined.  Continue Farxiga  and losartan . - Encourage regular exercise to improve cardiovascular fitness. - Repeat echocardiogram in February 2026 to assess heart function. - Monitor blood pressure at home and record readings before next visit.  # Atrial Fibrillation He is currently maintaining sinus rhythm.  Switched to Xarelto  for stroke prevention due to easier once-daily dosing. Plans to start Xarelto  once found. - Start Xarelto  once daily for stroke prevention.  Restart carvedilol  as above. - Refill Xarelto  prescription if not found soon.  # Hypertension On losartan  and carvedilol  for blood pressure management. - Continue losartan  for blood pressure control and kidney protection. - Continue carvedilol  for blood pressure management. - Monitor blood pressure at home and record readings before next visit.  # Chronic Kidney Disease IV:  GFR improved from 17 to 19.9. Managed potassium levels through dietary modifications. Under nephrologist care. - Continue dietary modifications to manage potassium levels. - Follow up with nephrologist Dr. Norine in 1-1.5 months. - Discuss losartan  use with nephrologist to ensure safety given kidney disease.   Dispo: f/u 10/2023 after echo  Signed, Annabella Scarce, MD

## 2024-07-25 ENCOUNTER — Telehealth: Payer: Self-pay

## 2024-07-25 ENCOUNTER — Ambulatory Visit

## 2024-07-25 VITALS — Ht 74.0 in | Wt 245.0 lb

## 2024-07-25 DIAGNOSIS — Z0000 Encounter for general adult medical examination without abnormal findings: Secondary | ICD-10-CM

## 2024-07-25 NOTE — Telephone Encounter (Signed)
 Patient stated that during his last office visit, he was to get 5 recommendations for a endocrinologist.  Patient would like that recommendation list as soon as possible.

## 2024-07-25 NOTE — Patient Instructions (Signed)
 Mr. Billy [name],  Thank you for taking the time for your Medicare Wellness Visit. I appreciate your continued commitment to your health goals. Please review the care plan we discussed, and feel free to reach out if I can assist you further.  Please note that Annual Wellness Visits do not include a physical exam. Some assessments may be limited, especially if the visit was conducted virtually. If needed, we may recommend an in-person follow-up with your provider.  Ongoing Care Seeing your primary care provider every 3 to 6 months helps us  monitor your health and provide consistent, personalized care. Last office visit on 06/13/2024.    Referrals If a referral was made during today's visit and you haven't received any updates within two weeks, please contact the referred provider directly to check on the status.  Recommended Screenings:  Health Maintenance  Topic Date Due   Medicare Annual Wellness Visit  Never done   COVID-19 Vaccine (1) Never done   Hepatitis B Vaccine (1 of 3 - 19+ 3-dose series) Never done   Zoster (Shingles) Vaccine (1 of 2) Never done   Pneumococcal Vaccine for age over 27 (3 of 3 - PCV) 07/18/2015   DTaP/Tdap/Td vaccine (3 - Td or Tdap) 10/20/2021   Flu Shot  04/14/2024   Hemoglobin A1C  12/13/2024   Eye exam for diabetics  12/28/2024   Yearly kidney function blood test for diabetes  06/13/2025   Yearly kidney health urinalysis for diabetes  06/13/2025   Complete foot exam   06/13/2025   Colon Cancer Screening  05/05/2026   Hepatitis C Screening  Completed   HIV Screening  Completed   HPV Vaccine  Aged Out   Meningitis B Vaccine  Aged Out       07/25/2024    2:02 PM  Advanced Directives  Does Patient Have a Medical Advance Directive? No  Would patient like information on creating a medical advance directive? No - Patient declined    Vision: Annual vision screenings are recommended for early detection of glaucoma, cataracts, and diabetic retinopathy. These  exams can also reveal signs of chronic conditions such as diabetes and high blood pressure.  Dental: Annual dental screenings help detect early signs of oral cancer, gum disease, and other conditions linked to overall health, including heart disease and diabetes.  Please see the attached documents for additional preventive care recommendations.
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
## 2024-07-27 ENCOUNTER — Ambulatory Visit: Admitting: Family Medicine

## 2024-08-01 ENCOUNTER — Other Ambulatory Visit: Payer: Self-pay | Admitting: Family Medicine

## 2024-08-01 DIAGNOSIS — F419 Anxiety disorder, unspecified: Secondary | ICD-10-CM

## 2024-08-01 MED ORDER — ALPRAZOLAM 2 MG PO TABS: 2.0000 mg | ORAL_TABLET | Freq: Three times a day (TID) | ORAL | 0 refills | Status: DC | PRN

## 2024-08-01 NOTE — Telephone Encounter (Unsigned)
 Copied from CRM [id number]. Topic: Clinical - Medication Refill >> Aug 01, 2024  9:00 AM Charlet HERO wrote: Medication:   alprazolam  (XANAX ) 2 MG tablet Medicat    Has the patient contacted their pharmacy? No (Agent: If no, request that the patient contact the pharmacy for the refill. If patient does not wish to contact the pharmacy document the reason why and proceed with request.) (Agent: If yes, when and what did the pharmacy advise?)  This is the patient's preferred pharmacy:  [facility]. [address] [place], [place] [address]. Phone; [phone]. Fax; [phone]  Is this the correct pharmacy for this prescription? Yes If no, delete pharmacy and type the correct one.   Has the prescription been filled recently? No  Is the patient out of the medication? Yes  Has the patient been seen for an appointment in the last year OR does the patient have an upcoming appointment? Yes  Can we respond through MyChart? Yes  Agent: Please be advised that Rx refills may take up to 3 business days. We ask that you follow-up with your pharmacy.

## 2024-08-01 NOTE — Progress Notes (Signed)
 Patient is requesting refill on Alprazolam  2mg  TID PRN. PDMP reviewed. Last refill was on 05/03/2024 with a 90 day supply. This provider does not fell comfortable prescribing a 90 day supply. Patient has been informed a 30 day will be sent in. UDS and controlled substance contract is UTD.

## 2024-08-15 ENCOUNTER — Ambulatory Visit: Admitting: Family Medicine

## 2024-08-16 ENCOUNTER — Ambulatory Visit: Admitting: Family Medicine

## 2024-08-22 ENCOUNTER — Ambulatory Visit: Admitting: Family Medicine

## 2024-08-22 ENCOUNTER — Encounter: Payer: Self-pay | Admitting: Family Medicine

## 2024-08-22 ENCOUNTER — Telehealth: Payer: Self-pay | Admitting: Family Medicine

## 2024-08-22 VITALS — BP 132/76 | HR 70 | Temp 97.8°F | Ht 74.0 in | Wt 252.0 lb

## 2024-08-22 DIAGNOSIS — F5101 Primary insomnia: Secondary | ICD-10-CM

## 2024-08-22 DIAGNOSIS — E113599 Type 2 diabetes mellitus with proliferative diabetic retinopathy without macular edema, unspecified eye: Secondary | ICD-10-CM

## 2024-08-22 DIAGNOSIS — Z794 Long term (current) use of insulin: Secondary | ICD-10-CM

## 2024-08-22 DIAGNOSIS — F419 Anxiety disorder, unspecified: Secondary | ICD-10-CM

## 2024-08-22 MED ORDER — TEMAZEPAM 15 MG PO CAPS: 15.0000 mg | ORAL_CAPSULE | Freq: Every evening | ORAL | 0 refills | Status: DC | PRN

## 2024-08-22 NOTE — Patient Instructions (Addendum)
-  Continue all medications.  -Prescribed Temazepam  15mg  at bedtime. Discussed about the dangers of taking this medication along with Alprazolam  at the same time. Strongly encourage to not take both medications at the same time. Advise to take Alprazolam  2 mg during the day and Temazepam  only at bedtime.  -Ordered urine drug screen for management of Temazepam  and Alprazolam . Signed a new controlled substance agreement for management of Temazepam .  -Continue care with cardiology and nephrologist.  -Follow up in 3 months.

## 2024-08-22 NOTE — Assessment & Plan Note (Addendum)
 Controlled. Scored 0 on GAD-7 and 3 on PHQ-9. Continue Alprazolam  2mg  TID PRN. Ordered urine drug screen for management of Alprazolam . Controlled substance agreement is UTD.  PDMP reviewed. Last refill was on 11/18 for 30 days.   Patient questioned when he would be trusted enough to not need urine drug screens. Informed patient it was policy and safe care practice for him and provider with taking controlled substances. Informed it had nothing to do with trust, all about safety. Informed him as long as he was in this providers care, UDS would be performed, controlled substance agreements would be signed, and 30 day supplies would be provided. This provider does not feel comfortable prescribing 90 day supplies of controlled substance. If he felt like he could not agree to these terms, he could look for a new primary care provider.

## 2024-08-22 NOTE — Progress Notes (Signed)
 Established Patient Office Visit   Subjective:  Patient ID: Billy [name], male    DOB: [birth year]  Age: 58 y.o. MRN: [id number]  Chief Complaint  Patient presents with   Medical Management of Chronic Issues    3 month follow up     HPI Anxiety/Insomnia: Chronic. Patient is prescribed Alprazolam  2mg  TID PRN. He usually takes it 1-2 times a day. He reports medication is effective. He does not take a long acting anti-anxiety medication.    Neuropathy: Chronic. Patient is taking Gabapentin  100mg  as needed. He reports he is hardly taking medication since he is controlling his blood sugar.    Diabetes: Chronic. Patient is using the Freestyle Libre to monitor his blood sugars. He is taking Humalog 3U PRN, and Farxiga  10mg  daily as dual therapy for diabetes and heart failure that is managed by cardiology. He was under the care of endocrinology with his previous PCP, however, at last appointment referred patient to an endocrinologist. He was referred to [facility], but has not made an appointment. Last A1c was 6.2 on 10/01.   Patient is complaining of insomnia. He reports he was previously taking Trazodone . However, it was not effective. He reports he tried an acquaintance, someone who was living with him,  Temazepam  and it was effective for insomnia. He is uncertain of the dosage.   He is being cared by [facility] with Dr. Annabella Scarce for HTN, paroxysmal Afib, chronic combined systolic and diastolic heart failure, and CAD. Last appointment was 10/20 with a follow up in 10/2024 after echocardiogram.   He is being cared by [facility] with Dr. Manuelita Barters. Based on chart review, do not see any recent notes. Last note was from 05/10/2024 with a 3 month follow up.  ROS See HPI above     Objective:   BP 132/76   Pulse 70   Temp 97.8 F (36.6 C) (Oral)   Ht 6' 2 (1.88 m)   Wt 252 lb (114.3 kg)   SpO2 98%   BMI 32.35 kg/m     Physical Exam Vitals reviewed.  Constitutional:      General: He is not in acute distress.    Appearance: Normal appearance. He is obese. He is not ill-appearing, toxic-appearing or diaphoretic.  HENT:     Head: Normocephalic and atraumatic.  Eyes:     General:        Right eye: No discharge.        Left eye: No discharge.     Conjunctiva/sclera: Conjunctivae normal.  Cardiovascular:     Rate and Rhythm: Normal rate and regular rhythm.     Heart sounds: Normal heart sounds. No murmur heard.    No friction rub. No gallop.  Pulmonary:     Effort: Pulmonary effort is normal. No respiratory distress.     Breath sounds: Normal breath sounds.  Musculoskeletal:        General: Normal range of motion.  Skin:    General: Skin is warm and dry.  Neurological:     General: No focal deficit present.     Mental Status: He is alert and oriented to person, place, and time. Mental status is at baseline.  Psychiatric:        Mood and Affect: Mood normal.        Behavior: Behavior normal.        Thought Content: Thought content normal.        Judgment:  Judgment normal.      Assessment & Plan:  Primary insomnia Assessment & Plan: Discussed about trying Temazepam  for insomnia since it has been effective previously. Discussed the dangers of taking this medication along with Alprazolam  at the same time. Strongly encouraged to not take both medications at the same time. Advised to take Alprazolam  2mg  TID PRN during the day and Temazepam  at bedtime. Ordered UDS and will need to be collected periodically. Signed another controlled substance agreement for this medication and will need to be renewed every year.    Orders: -     DRUG MONITOR, PANEL 1, SCREEN, URINE -     Temazepam ; Take 1 capsule (15 mg total) by mouth at bedtime as needed for sleep. Do not take this medication at the same time as Alprazolam .  Dispense: 30 capsule; Refill: 0  Anxiety disorder, unspecified type Assessment &  Plan: Controlled. Scored 0 on GAD-7 and 3 on PHQ-9. Continue Alprazolam  2mg  TID PRN. Ordered urine drug screen for management of Alprazolam . Controlled substance agreement is UTD.  PDMP reviewed. Last refill was on 11/18 for 30 days.   Patient questioned when he would be trusted enough to not need urine drug screens. Informed patient it was policy and safe care practice for him and provider with taking controlled substances. Informed it had nothing to do with trust, all about safety. Informed him as long as he was in this providers care, UDS would be performed, controlled substance agreements would be signed, and 30 day supplies would be provided. This provider does not feel comfortable prescribing 90 day supplies of controlled substance. If he felt like he could not agree to these terms, he could look for a new primary care provider.     Orders: -     DRUG MONITOR, PANEL 1, SCREEN, URINE  Type 2 diabetes mellitus with proliferative retinopathy without macular edema, with long-term current use of insulin , unspecified laterality (HCC) Assessment & Plan: Last A1c was stable within the last 3 months. Continue Humalog 3U PRN and Farxiga  10mg  daily with monitoring blood sugars through [facility], especially being on an insulin  that can easily cause hypoglycemia. Placed a referral for [facility] at last appointment, but has not made an appointment. Patient mentioned that his previous PCP was also an endocrinologist. Informed this provider is not an endocrinologist and he would need an endocrinologist. Patient is on a ARB but not on a statin. Foot exam, microalbumin/creatinine, and ophthalmology eye exam is UTD.  -A1c is not due, completed within the last 3 months.      -Continue care with cardiology and nephrology.  -Patient questioned for this provider to remove gout from another provider visit note. Informed patient that this provider could not remove gout from another providers visit  note, only in the medical history, which gout is not even on the list.  -Patient had a NO SHOW visit for 08/16/2024. He asked if this provider could help with not having a charge for the no show visit. Informed patient that is a policy that this provider supports and can not weave the charge. Providers can not change this policy. Advised him to speak with the office manager at the front desk.   Return in about 3 months (around 11/20/2024) for chronic management.   Fradel Baldonado, NP

## 2024-08-22 NOTE — Telephone Encounter (Signed)
 Pt arrived to the FD very frustrated going back and forth with my FD staff. I grabbed pt and brought him to the back to talk with.   Pt stated he was recently dismissed from [facility] for missing 2 appts thus why he came to us  but we do things differently from [place]. He was frustrated with check out because he stated that at his old Dr office the Drs tell the FD what they need the pt to do for FU; he feels like here the responsibility is on the pt to tell FD what the Dr needs and he is not use to that.   Additionally, he said that he feels like a number here. His last Dr was also a endocrinologist and he feels like JoAnna didn't ask him all the questions he usually hears. Let pt know that JoAnna is not that and pt agreed that she wasn't but he still wanted it. Also, he was frustrated that JoAnna couldn't prescribe him to narcotics at once. Pt felt like he needed them both, one during the day and one at night.   Pt expressed that he doesn't feel like he likes it here because he got a no show fee; I let pt know of our policies here and he said it's different from [place]. I let pt know that if he wants to find another Dr that was close to how [place] runs he can look for another provider since he doesn't like it here and he stated no that he is stuck here right now and will just look for a Endocrinologist. He just want the records to show that he feels like a number and he don't like our policies.   FYI

## 2024-08-22 NOTE — Assessment & Plan Note (Addendum)
 Last A1c was stable within the last 3 months. Continue Humalog 3U PRN and Farxiga  10mg  daily with monitoring blood sugars through [facility], especially being on an insulin  that can easily cause hypoglycemia. Placed a referral for [facility] at last appointment, but has not made an appointment. Patient mentioned that his previous PCP was also an endocrinologist. Informed this provider is not an endocrinologist and he would need an endocrinologist. Patient is on a ARB but not on a statin. Foot exam, microalbumin/creatinine, and ophthalmology eye exam is UTD.  -A1c is not due, completed within the last 3 months.

## 2024-08-22 NOTE — Assessment & Plan Note (Addendum)
 Discussed about trying Temazepam  for insomnia since it has been effective previously. Discussed the dangers of taking this medication along with Alprazolam  at the same time. Strongly encouraged to not take both medications at the same time. Advised to take Alprazolam  2mg  TID PRN during the day and Temazepam  at bedtime. Ordered UDS and will need to be collected periodically. Signed another controlled substance agreement for this medication and will need to be renewed every year.

## 2024-08-23 ENCOUNTER — Other Ambulatory Visit: Payer: Self-pay | Admitting: Family Medicine

## 2024-08-23 DIAGNOSIS — Z794 Long term (current) use of insulin: Secondary | ICD-10-CM

## 2024-08-23 LAB — DRUG MONITOR, PANEL 1, SCREEN, URINE
Amphetamines: NEGATIVE ng/mL (ref <500)
Barbiturates: NEGATIVE ng/mL (ref <300)
Benzodiazepines: POSITIVE ng/mL — AB (ref <100)
Cocaine Metabolite: NEGATIVE ng/mL (ref <150)
Creatinine: 62.5 mg/dL (ref >=20.0)
Marijuana Metabolite: NEGATIVE ng/mL (ref <20)
Methadone Metabolite: NEGATIVE ng/mL (ref <100)
Opiates: NEGATIVE ng/mL (ref <100)
Oxidant: NEGATIVE ug/mL (ref <200)
Oxycodone: NEGATIVE ng/mL (ref <100)
Phencyclidine: NEGATIVE ng/mL (ref <25)
pH: 6 (ref 4.5–9.0)

## 2024-08-23 LAB — DM TEMPLATE

## 2024-08-23 NOTE — Telephone Encounter (Signed)
 Pt called back today requesting for manager and wanted to see when his next appt was sched let pt know that it was sched in March for [facility].   Pt then stated that he had another issue with Jo[facility]; he hasn't gotten to the bottom of why he was charged the $50 and why pts have to tell the FD when they should come back for a FU. He believes the Dr should tell the FD staff that info.   Let pt know that we discussed this yesterday and that we already came up with a understanding concerning both complaints (see below encounter). Pt stated that we didn't apologize to him and let him know the next steps. Let pt know that an apology was given several times and I reiterated that we discussed if he wasn't happy here that he can look for another provider that has similar policies to [place].   Pt stated that he spoke with another set of supervisors yesterday as well and I let him know that after he spoke with them he stated that he was fine. Pt then stated he was not fine and still upset and want to talk back with those supervisor because we didn't reach a resolution.   FYI

## 2024-08-24 ENCOUNTER — Other Ambulatory Visit (HOSPITAL_BASED_OUTPATIENT_CLINIC_OR_DEPARTMENT_OTHER): Payer: Self-pay | Admitting: Family

## 2024-08-24 ENCOUNTER — Ambulatory Visit: Payer: Self-pay | Admitting: Family Medicine

## 2024-08-24 DIAGNOSIS — I5042 Chronic combined systolic (congestive) and diastolic (congestive) heart failure: Secondary | ICD-10-CM

## 2024-08-24 DIAGNOSIS — I2510 Atherosclerotic heart disease of native coronary artery without angina pectoris: Secondary | ICD-10-CM

## 2024-08-30 ENCOUNTER — Other Ambulatory Visit: Payer: Self-pay | Admitting: Family Medicine

## 2024-08-30 DIAGNOSIS — F419 Anxiety disorder, unspecified: Secondary | ICD-10-CM

## 2024-08-30 NOTE — Telephone Encounter (Unsigned)
 Copied from CRM #[id number]. Topic: Clinical - Medication Refill >> Aug 30, 2024  1:34 PM Alexandria E wrote: Medication: alprazolam  (XANAX ) 2 MG tablet   Has the patient contacted their pharmacy? No (Agent: If no, request that the patient contact the pharmacy for the refill. If patient does not wish to contact the pharmacy document the reason why and proceed with request.) (Agent: If yes, when and what did the pharmacy advise?)  This is the [facility]'s preferred [phone] [address] [place] - [address] [place] [address] Phone: [phone] Fax: [phone]  Is this the correct pharmacy for this prescription? Yes If no, delete pharmacy and type the correct one.   Has the prescription been filled recently? No  Is the patient out of the medication? Yes  Has the patient been seen for an appointment in the last year OR does the patient have an upcoming appointment? Yes  Can we respond through MyChart? Yes  Agent: Please be advised that Rx refills may take up to 3 business days. We ask that you follow-up with your pharmacy.

## 2024-08-30 NOTE — Telephone Encounter (Signed)
 Copied from CRM #[id number]. Topic: General - Billing Inquiry >> Aug 30, 2024  1:21 PM Alexandria E wrote: Reason for CRM: Patient stated he was a no-show for his visit on 12/3 and was told that he would have to pay $50 for this visit, patient would like a call to clarify this.  FYI; pt was informed several times the reasoning why.

## 2024-08-31 ENCOUNTER — Telehealth: Payer: Self-pay | Admitting: *Deleted

## 2024-08-31 MED ORDER — ALPRAZOLAM 2 MG PO TABS: 2.0000 mg | ORAL_TABLET | Freq: Three times a day (TID) | ORAL | 0 refills | Status: AC | PRN

## 2024-08-31 NOTE — Telephone Encounter (Signed)
 Medication was refilled.

## 2024-08-31 NOTE — Telephone Encounter (Signed)
 Copied from CRM #[id number]. Topic: Clinical - Medication Question >> Aug 31, 2024 10:30 AM Anairis L wrote: Reason for CRM: Following up on alprazolam  (XANAX ) 2 MG tablet , patient is completely out and was unaware it may take up to 3 business day to fill.

## 2024-08-31 NOTE — Telephone Encounter (Signed)
 Noted

## 2024-09-04 ENCOUNTER — Telehealth: Payer: Self-pay | Admitting: Family Medicine

## 2024-09-04 NOTE — Telephone Encounter (Unsigned)
 Copied from CRM #[id number]. Topic: Clinical - Medication Refill >> Sep 04, 2024  3:13 PM Franky GRADE wrote: Medication: diclofenac  Sodium (VOLTAREN ) 3 % GEL [[id number]] & HUMALOG  KWIKPEN 100 UNIT/ML KwikPen [[id number]]  Has the patient contacted their pharmacy? Yes (Agent: If no, request that the patient contact the pharmacy for the refill. If patient does not wish to contact the pharmacy document the reason why and proceed with request.) (Agent: If yes, when and what did the pharmacy advise?)  This is the patient's preferred pharmacy:  [facility] [place] [place] - [address] [place] [address] Phone: [phone] Fax: [phone]    Is this the correct pharmacy for this prescription? Yes If no, delete pharmacy and type the correct one.   Has the prescription been filled recently? No  Is the patient out of the medication? Yes  Has the patient been seen for an appointment in the last year OR does the patient have an upcoming appointment? Yes  Can we respond through MyChart? No patient would like a call.  Agent: Please be advised that Rx refills may take up to 3 business days. We ask that you follow-up with your pharmacy.

## 2024-09-05 MED ORDER — HUMALOG KWIKPEN 100 UNIT/ML ~~LOC~~ SOPN: 3.0000 [IU] | PEN_INJECTOR | SUBCUTANEOUS | 2 refills | Status: AC | PRN

## 2024-09-05 MED ORDER — DICLOFENAC SODIUM 1 % EX GEL: 4.0000 g | Freq: Four times a day (QID) | CUTANEOUS | 0 refills | Status: AC | PRN

## 2024-09-11 ENCOUNTER — Ambulatory Visit
Admission: EM | Admit: 2024-09-11 | Discharge: 2024-09-11 | Disposition: A | Discharge status: Home / Self Care | Attending: Family Medicine | Admitting: Family Medicine

## 2024-09-11 ENCOUNTER — Encounter: Payer: Self-pay | Admitting: Emergency Medicine

## 2024-09-11 DIAGNOSIS — J069 Acute upper respiratory infection, unspecified: Secondary | ICD-10-CM | POA: Diagnosis not present

## 2024-09-11 DIAGNOSIS — J4521 Mild intermittent asthma with (acute) exacerbation: Secondary | ICD-10-CM | POA: Diagnosis not present

## 2024-09-11 DIAGNOSIS — K529 Noninfective gastroenteritis and colitis, unspecified: Secondary | ICD-10-CM | POA: Insufficient documentation

## 2024-09-11 DIAGNOSIS — E1100 Type 2 diabetes mellitus with hyperosmolarity without nonketotic hyperglycemic-hyperosmolar coma (NKHHC): Secondary | ICD-10-CM | POA: Insufficient documentation

## 2024-09-11 LAB — POC SOFIA SARS ANTIGEN FIA: SARS Coronavirus 2 Ag: NEGATIVE

## 2024-09-11 LAB — POCT INFLUENZA A/B
Influenza A, POC: NEGATIVE
Influenza B, POC: NEGATIVE

## 2024-09-11 MED ORDER — ALBUTEROL SULFATE HFA 108 (90 BASE) MCG/ACT IN AERS: 2.0000 | INHALATION_SPRAY | RESPIRATORY_TRACT | 0 refills | Status: AC | PRN

## 2024-09-11 MED ORDER — PREDNISONE 20 MG PO TABS: 40.0000 mg | ORAL_TABLET | Freq: Every day | ORAL | 0 refills | Status: AC

## 2024-09-11 MED ORDER — BENZONATATE 100 MG PO CAPS: 100.0000 mg | ORAL_CAPSULE | Freq: Three times a day (TID) | ORAL | 0 refills | Status: DC | PRN

## 2024-09-11 NOTE — ED Triage Notes (Signed)
 Patient has nasal and chest congestion, cough, and headache that started 12/26.  Patient has not taken any medication
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
## 2024-09-11 NOTE — Discharge Instructions (Addendum)
 The testing for flu and COVID was negative.  Albuterol  inhaler--do 2 puffs every 4 hours as needed for shortness of breath or wheezing  Take prednisone  20 mg--2 daily for 5 days; this is for inflammation in your lungs that is causing you to wheeze.  This medication can make your sugars go higher.  Please drink plenty of fluids and be as good as you can on your diet in the next few days.  Take benzonatate  100 mg, 1 tab every 8 hours as needed for cough.

## 2024-09-15 ENCOUNTER — Telehealth: Payer: Self-pay | Admitting: *Deleted

## 2024-09-15 MED ORDER — DM-GUAIFENESIN ER 30-600 MG PO TB12: 1.0000 | ORAL_TABLET | Freq: Two times a day (BID) | ORAL | 0 refills | Status: DC | PRN

## 2024-09-15 NOTE — Telephone Encounter (Signed)
 Patient seen on December 29 for upper respiratory symptoms and wheezing.  Testing was negative for flu and COVID.  Treatments were provided for wheezing and possible asthma exacerbation.  If he is continuing to have a lot of chest congestion we should probably see him again and possibly do a chest x-ray.

## 2024-09-15 NOTE — Telephone Encounter (Signed)
 I called Mr. Billy [name] and advised him of Dr. Claude recommendations:   Vonna Sharlet POUR, MD PB   09/15/24  9:48 AM Note Patient seen on December 29 for upper respiratory symptoms and wheezing.  Testing was negative for flu and COVID.  Treatments were provided for wheezing and possible asthma exacerbation.  If he is continuing to have a lot of chest congestion we should probably see him again and possibly do a chest x-ray.     He verbalized understanding and asked if a strong expectorant could be called in to [facility] on [address] until he could get a ride to come to Urgent Care. Dr. Vonna made aware. He stated that he has to arrange for transportation through his insurance company and would try to come in this weekend if not feeling better.

## 2024-09-15 NOTE — Telephone Encounter (Signed)
 Message received from pt access and forwarded to provider:  phone message, mrn  [phone] Billy [name] was seen 12/29 and wanted to speak to someone- stated that the meds aren't working. [phone]

## 2024-09-15 NOTE — Telephone Encounter (Signed)
 Mucinex  DM sent in at his request

## 2024-09-17 ENCOUNTER — Ambulatory Visit
Admission: RE | Admit: 2024-09-17 | Discharge: 2024-09-17 | Disposition: A | Discharge status: Home / Self Care | Source: Ambulatory Visit | Attending: Student | Admitting: Student

## 2024-09-17 ENCOUNTER — Ambulatory Visit (INDEPENDENT_AMBULATORY_CARE_PROVIDER_SITE_OTHER)

## 2024-09-17 VITALS — BP 131/87 | HR 64 | Temp 98.2°F | Resp 18 | Ht 74.0 in | Wt 250.0 lb

## 2024-09-17 DIAGNOSIS — J208 Acute bronchitis due to other specified organisms: Secondary | ICD-10-CM

## 2024-09-17 MED ORDER — DM-GUAIFENESIN ER 30-600 MG PO TB12: 1.0000 | ORAL_TABLET | Freq: Two times a day (BID) | ORAL | 0 refills | Status: AC | PRN

## 2024-09-17 MED ORDER — BENZONATATE 100 MG PO CAPS: 100.0000 mg | ORAL_CAPSULE | Freq: Three times a day (TID) | ORAL | 0 refills | Status: AC | PRN

## 2024-09-17 MED ORDER — AZITHROMYCIN 250 MG PO TABS: 250.0000 mg | ORAL_TABLET | Freq: Every day | ORAL | 0 refills | Status: AC

## 2024-09-17 MED ORDER — IPRATROPIUM-ALBUTEROL 0.5-2.5 (3) MG/3ML IN SOLN
3.0000 mL | Freq: Once | RESPIRATORY_TRACT | Status: AC
  Administered 2024-09-17: 3 mL via RESPIRATORY_TRACT

## 2024-09-17 NOTE — ED Triage Notes (Signed)
 Marce presents with congestion, wheezing at night, back and abdomen pain from coughing. I finished my prescription and also taking mucus relief with little relief. I think I need a chest xray.
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
## 2024-09-17 NOTE — Discharge Instructions (Addendum)
-  Your chest xray looked normal today. You do not have pneumonia. -Albuterol  inhaler that you have already as needed for cough, wheezing, shortness of breath, 1 to 2 puffs every 6 hours as needed. -Z-pack antibiotic for sinus infection -I refilled the tessalon  perles and dextromethorphan -guaifenesin   -Your cough should slowly get better instead of worse. If you develop a cough productive of dark or red sputum, new shortness of breath, new chest tightness, new fevers, etc - seek additional care.

## 2024-09-18 ENCOUNTER — Other Ambulatory Visit (HOSPITAL_COMMUNITY): Payer: Self-pay

## 2024-10-02 ENCOUNTER — Telehealth: Payer: Self-pay

## 2024-10-02 ENCOUNTER — Other Ambulatory Visit: Payer: Self-pay | Admitting: Family Medicine

## 2024-10-02 DIAGNOSIS — F5101 Primary insomnia: Secondary | ICD-10-CM

## 2024-10-02 DIAGNOSIS — F419 Anxiety disorder, unspecified: Secondary | ICD-10-CM

## 2024-10-02 DIAGNOSIS — E1140 Type 2 diabetes mellitus with diabetic neuropathy, unspecified: Secondary | ICD-10-CM

## 2024-10-02 MED ORDER — ALPRAZOLAM 2 MG PO TABS: 2.0000 mg | ORAL_TABLET | Freq: Three times a day (TID) | ORAL | 0 refills | Status: DC | PRN

## 2024-10-02 MED ORDER — GABAPENTIN 100 MG PO CAPS: 100.0000 mg | ORAL_CAPSULE | ORAL | 2 refills | Status: AC | PRN

## 2024-10-02 MED ORDER — TEMAZEPAM 15 MG PO CAPS: 15.0000 mg | ORAL_CAPSULE | Freq: Every evening | ORAL | 0 refills | Status: DC | PRN

## 2024-10-02 NOTE — Progress Notes (Signed)
 Requesting a refill on Alprazolam , Temazepam , and Gabapentin . UDS and controlled substance contract is UTD.

## 2024-10-02 NOTE — Telephone Encounter (Signed)
 Copied from CRM [id number]. Topic: Clinical - Medication Refill >> Oct 02, 2024 11:29 AM Hadassah PARAS wrote: Medication:; temazepam  (RESTORIL ) 15 MG capsule; alprazolam  (XANAX ) 2 [facility];  gabapentin  (NEURONTIN ) 100 MG capsule sent to [facility]    Has the patient contacted their pharmacy? Yes (Agent: If no, request that the patient contact the pharmacy for the refill. If patient does not wish to contact the pharmacy document the reason why and proceed with request.) (Agent: If yes, when and what did the pharmacy advise?)  This is the patient's preferred pharmacy:   [facility] - [place], [place] - [address] [place] [address] Phone: [phone] Fax: [phone] Hours: Not open 24 hours     [id number] [place], [place] - [address] [place] [address] Phone: [phone] Fax: [phone]  Is this the correct pharmacy for this prescription? Yes If no, delete pharmacy and type the correct one.   Has the prescription been filled recently? No  Is the patient out of the medication? Yes  Has the patient been seen for an appointment in the last year OR does the patient have an upcoming appointment? Yes  Can we respond through MyChart? Yes  Agent: Please be advised that Rx refills may take up to 3 business days. We ask that you follow-up with your pharmacy.

## 2024-10-03 ENCOUNTER — Other Ambulatory Visit: Payer: Self-pay | Admitting: Family Medicine

## 2024-10-03 ENCOUNTER — Telehealth: Payer: Self-pay | Admitting: *Deleted

## 2024-10-03 DIAGNOSIS — F5101 Primary insomnia: Secondary | ICD-10-CM

## 2024-10-03 DIAGNOSIS — F419 Anxiety disorder, unspecified: Secondary | ICD-10-CM

## 2024-10-03 NOTE — Progress Notes (Signed)
 Error

## 2024-10-03 NOTE — Telephone Encounter (Signed)
 Copied from CRM #[id number]. Topic: Clinical - Prescription Issue >> Oct 03, 2024 12:57 PM Deleta RAMAN wrote: Reason for CRM: temazepam  (RESTORIL ) 15 MG capsule and alprazolam  (XANAX ) 2 MG tablet was sent to the wrong pharmacy. [facility] Please follow up with patient [phone] >> Oct 03, 2024  1:16 PM Mercedes MATSU wrote: Patient called back because he states that he contacted the pharmacy and they told him the refill request for his two controlled substances on file were not marked to be rush ordered. Patient states he would like his medication filled today and is requesting a call back at [phone].

## 2024-10-04 ENCOUNTER — Other Ambulatory Visit: Payer: Self-pay | Admitting: Family Medicine

## 2024-10-04 DIAGNOSIS — F5101 Primary insomnia: Secondary | ICD-10-CM

## 2024-10-04 DIAGNOSIS — F419 Anxiety disorder, unspecified: Secondary | ICD-10-CM

## 2024-10-04 MED ORDER — ALPRAZOLAM 2 MG PO TABS: 2.0000 mg | ORAL_TABLET | Freq: Three times a day (TID) | ORAL | 0 refills | Status: AC | PRN

## 2024-10-04 MED ORDER — TEMAZEPAM 15 MG PO CAPS: 15.0000 mg | ORAL_CAPSULE | Freq: Every evening | ORAL | 0 refills | Status: AC | PRN

## 2024-10-04 NOTE — Progress Notes (Signed)
 Refilling [facility]epam  and [facility]. UDS and controlled substance contract is UTD. PDMP reviewed. Last refill on 12/18.

## 2024-10-19 ENCOUNTER — Other Ambulatory Visit (HOSPITAL_BASED_OUTPATIENT_CLINIC_OR_DEPARTMENT_OTHER)

## 2024-10-19 DIAGNOSIS — I480 Paroxysmal atrial fibrillation: Secondary | ICD-10-CM

## 2024-10-19 LAB — ECHOCARDIOGRAM COMPLETE
Area-P 1/2: 3.85 cm2
Calc EF: 57.3 %
S' Lateral: 3.55 cm
Single Plane A2C EF: 48.3 %
Single Plane A4C EF: 69.4 %

## 2024-10-26 ENCOUNTER — Ambulatory Visit (HOSPITAL_BASED_OUTPATIENT_CLINIC_OR_DEPARTMENT_OTHER): Admitting: Nurse Practitioner

## 2024-11-20 ENCOUNTER — Ambulatory Visit: Admitting: Family Medicine
